# Patient Record
Sex: Male | Born: 1935 | Race: White | Hispanic: No | Smoking: Former smoker
Health system: Southern US, Community
[De-identification: ages and names within clinical notes are randomized; demographics above are authoritative.]

## PROBLEM LIST (undated history)

## (undated) DIAGNOSIS — F0150 Vascular dementia without behavioral disturbance: Secondary | ICD-10-CM

## (undated) DIAGNOSIS — R5383 Other fatigue: Secondary | ICD-10-CM

## (undated) DIAGNOSIS — G8929 Other chronic pain: Secondary | ICD-10-CM

## (undated) DIAGNOSIS — M1990 Unspecified osteoarthritis, unspecified site: Secondary | ICD-10-CM

## (undated) DIAGNOSIS — I493 Ventricular premature depolarization: Secondary | ICD-10-CM

## (undated) DIAGNOSIS — I951 Orthostatic hypotension: Secondary | ICD-10-CM

## (undated) DIAGNOSIS — I639 Cerebral infarction, unspecified: Secondary | ICD-10-CM

## (undated) DIAGNOSIS — I5022 Chronic systolic (congestive) heart failure: Secondary | ICD-10-CM

## (undated) DIAGNOSIS — G4700 Insomnia, unspecified: Secondary | ICD-10-CM

## (undated) DIAGNOSIS — Z8719 Personal history of other diseases of the digestive system: Secondary | ICD-10-CM

## (undated) DIAGNOSIS — K5909 Other constipation: Secondary | ICD-10-CM

## (undated) DIAGNOSIS — L723 Sebaceous cyst: Secondary | ICD-10-CM

## (undated) DIAGNOSIS — D696 Thrombocytopenia, unspecified: Secondary | ICD-10-CM

## (undated) DIAGNOSIS — R51 Headache: Secondary | ICD-10-CM

## (undated) DIAGNOSIS — J189 Pneumonia, unspecified organism: Secondary | ICD-10-CM

## (undated) DIAGNOSIS — K219 Gastro-esophageal reflux disease without esophagitis: Secondary | ICD-10-CM

## (undated) DIAGNOSIS — M1712 Unilateral primary osteoarthritis, left knee: Secondary | ICD-10-CM

## (undated) DIAGNOSIS — I10 Essential (primary) hypertension: Secondary | ICD-10-CM

## (undated) DIAGNOSIS — M25519 Pain in unspecified shoulder: Secondary | ICD-10-CM

## (undated) DIAGNOSIS — E039 Hypothyroidism, unspecified: Secondary | ICD-10-CM

## (undated) DIAGNOSIS — E538 Deficiency of other specified B group vitamins: Secondary | ICD-10-CM

## (undated) DIAGNOSIS — E7800 Pure hypercholesterolemia, unspecified: Secondary | ICD-10-CM

## (undated) DIAGNOSIS — E119 Type 2 diabetes mellitus without complications: Secondary | ICD-10-CM

## (undated) DIAGNOSIS — R5381 Other malaise: Secondary | ICD-10-CM

## (undated) DIAGNOSIS — M129 Arthropathy, unspecified: Secondary | ICD-10-CM

## (undated) DIAGNOSIS — I499 Cardiac arrhythmia, unspecified: Secondary | ICD-10-CM

## (undated) DIAGNOSIS — D759 Disease of blood and blood-forming organs, unspecified: Secondary | ICD-10-CM

## (undated) DIAGNOSIS — I4891 Unspecified atrial fibrillation: Secondary | ICD-10-CM

## (undated) DIAGNOSIS — T84019A Broken internal joint prosthesis, unspecified site, initial encounter: Secondary | ICD-10-CM

## (undated) DIAGNOSIS — M545 Low back pain, unspecified: Secondary | ICD-10-CM

## (undated) DIAGNOSIS — Z9889 Other specified postprocedural states: Secondary | ICD-10-CM

## (undated) DIAGNOSIS — I2510 Atherosclerotic heart disease of native coronary artery without angina pectoris: Secondary | ICD-10-CM

## (undated) DIAGNOSIS — I82409 Acute embolism and thrombosis of unspecified deep veins of unspecified lower extremity: Secondary | ICD-10-CM

## (undated) DIAGNOSIS — M79609 Pain in unspecified limb: Secondary | ICD-10-CM

## (undated) HISTORY — DX: Type 2 diabetes mellitus without complications: E11.9

## (undated) HISTORY — DX: Sebaceous cyst: L72.3

## (undated) HISTORY — DX: Arthropathy, unspecified: M12.9

## (undated) HISTORY — DX: Deficiency of other specified B group vitamins: E53.8

## (undated) HISTORY — DX: Chronic systolic (congestive) heart failure: I50.22

## (undated) HISTORY — PX: BACK SURGERY: SHX140

## (undated) HISTORY — DX: Unspecified atrial fibrillation: I48.91

## (undated) HISTORY — DX: Gastro-esophageal reflux disease without esophagitis: K21.9

## (undated) HISTORY — DX: Atherosclerotic heart disease of native coronary artery without angina pectoris: I25.10

## (undated) HISTORY — DX: Pneumonia, unspecified organism: J18.9

## (undated) HISTORY — DX: Thrombocytopenia, unspecified: D69.6

## (undated) HISTORY — DX: Vascular dementia, unspecified severity, without behavioral disturbance, psychotic disturbance, mood disturbance, and anxiety: F01.50

## (undated) HISTORY — DX: Orthostatic hypotension: I95.1

## (undated) HISTORY — DX: Insomnia, unspecified: G47.00

## (undated) HISTORY — DX: Other specified postprocedural states: Z98.890

## (undated) HISTORY — DX: Pain in unspecified limb: M79.609

## (undated) HISTORY — DX: Unspecified osteoarthritis, unspecified site: M19.90

## (undated) HISTORY — PX: COLONOSCOPY: SHX174

## (undated) HISTORY — DX: Other malaise: R53.81

## (undated) HISTORY — DX: Acute embolism and thrombosis of unspecified deep veins of unspecified lower extremity: I82.409

## (undated) HISTORY — DX: Other constipation: K59.09

## (undated) HISTORY — DX: Other fatigue: R53.83

## (undated) HISTORY — DX: Essential (primary) hypertension: I10

## (undated) HISTORY — DX: Cerebral infarction, unspecified: I63.9

## (undated) HISTORY — DX: Ventricular premature depolarization: I49.3

## (undated) HISTORY — DX: Hypothyroidism, unspecified: E03.9

## (undated) HISTORY — PX: HAND SURGERY: SHX662

## (undated) HISTORY — PX: SHOULDER HEMI-ARTHROPLASTY: SHX5049

## (undated) HISTORY — PX: FINGER AMPUTATION: SHX636

## (undated) HISTORY — DX: Pain in unspecified shoulder: M25.519

## (undated) HISTORY — DX: Pure hypercholesterolemia, unspecified: E78.00

---

## 1989-09-03 HISTORY — PX: KNEE SURGERY: SHX244

## 2002-05-09 ENCOUNTER — Encounter: Payer: Self-pay | Admitting: Orthopaedic Surgery

## 2002-05-09 ENCOUNTER — Ambulatory Visit (HOSPITAL_COMMUNITY): Admission: RE | Admit: 2002-05-09 | Discharge: 2002-05-09 | Payer: Self-pay | Admitting: Orthopaedic Surgery

## 2005-03-29 ENCOUNTER — Encounter: Admission: RE | Admit: 2005-03-29 | Discharge: 2005-03-29 | Payer: Self-pay | Admitting: *Deleted

## 2005-03-30 ENCOUNTER — Ambulatory Visit (HOSPITAL_COMMUNITY): Admission: RE | Admit: 2005-03-30 | Discharge: 2005-03-31 | Payer: Self-pay | Admitting: *Deleted

## 2005-08-07 ENCOUNTER — Emergency Department (HOSPITAL_COMMUNITY): Admission: EM | Admit: 2005-08-07 | Discharge: 2005-08-07 | Payer: Self-pay | Admitting: Emergency Medicine

## 2005-09-03 HISTORY — PX: LUMBAR SPINE SURGERY: SHX701

## 2007-09-04 HISTORY — PX: CATARACT EXTRACTION: SUR2

## 2008-01-19 ENCOUNTER — Encounter: Admission: RE | Admit: 2008-01-19 | Discharge: 2008-01-19 | Payer: Self-pay | Admitting: Family Medicine

## 2008-02-24 ENCOUNTER — Encounter: Admission: RE | Admit: 2008-02-24 | Discharge: 2008-02-24 | Payer: Self-pay | Admitting: Orthopedic Surgery

## 2008-02-25 ENCOUNTER — Encounter (INDEPENDENT_AMBULATORY_CARE_PROVIDER_SITE_OTHER): Payer: Self-pay | Admitting: Orthopedic Surgery

## 2008-02-25 ENCOUNTER — Ambulatory Visit (HOSPITAL_BASED_OUTPATIENT_CLINIC_OR_DEPARTMENT_OTHER): Admission: RE | Admit: 2008-02-25 | Discharge: 2008-02-25 | Payer: Self-pay | Admitting: Orthopedic Surgery

## 2008-10-26 ENCOUNTER — Emergency Department (HOSPITAL_COMMUNITY): Admission: EM | Admit: 2008-10-26 | Discharge: 2008-10-26 | Payer: Self-pay | Admitting: Emergency Medicine

## 2009-04-05 ENCOUNTER — Encounter: Payer: Self-pay | Admitting: Family Medicine

## 2009-05-11 ENCOUNTER — Emergency Department (HOSPITAL_COMMUNITY): Admission: EM | Admit: 2009-05-11 | Discharge: 2009-05-13 | Payer: Self-pay | Admitting: Emergency Medicine

## 2009-07-04 ENCOUNTER — Encounter: Payer: Self-pay | Admitting: Family Medicine

## 2009-08-12 ENCOUNTER — Ambulatory Visit: Payer: Self-pay | Admitting: Family Medicine

## 2009-08-12 DIAGNOSIS — I10 Essential (primary) hypertension: Secondary | ICD-10-CM

## 2009-08-12 DIAGNOSIS — I2510 Atherosclerotic heart disease of native coronary artery without angina pectoris: Secondary | ICD-10-CM | POA: Insufficient documentation

## 2009-08-12 DIAGNOSIS — E7800 Pure hypercholesterolemia, unspecified: Secondary | ICD-10-CM

## 2009-08-12 DIAGNOSIS — K219 Gastro-esophageal reflux disease without esophagitis: Secondary | ICD-10-CM

## 2009-08-12 DIAGNOSIS — M79609 Pain in unspecified limb: Secondary | ICD-10-CM | POA: Insufficient documentation

## 2009-08-15 ENCOUNTER — Telehealth: Payer: Self-pay | Admitting: Family Medicine

## 2009-08-22 ENCOUNTER — Telehealth: Payer: Self-pay | Admitting: Family Medicine

## 2009-08-30 ENCOUNTER — Encounter: Payer: Self-pay | Admitting: Family Medicine

## 2009-09-08 ENCOUNTER — Ambulatory Visit: Payer: Self-pay | Admitting: Orthopedic Surgery

## 2009-11-07 ENCOUNTER — Telehealth: Payer: Self-pay | Admitting: Family Medicine

## 2009-11-25 ENCOUNTER — Ambulatory Visit: Payer: Self-pay | Admitting: Family Medicine

## 2009-12-29 ENCOUNTER — Encounter: Payer: Self-pay | Admitting: Family Medicine

## 2009-12-29 ENCOUNTER — Telehealth: Payer: Self-pay | Admitting: Family Medicine

## 2009-12-30 ENCOUNTER — Ambulatory Visit: Payer: Self-pay | Admitting: Family Medicine

## 2009-12-30 LAB — CONVERTED CEMR LAB
Blood Glucose, Fingerstick: 93
Nitrite: NEGATIVE
Specific Gravity, Urine: <1.005
WBC Urine, dipstick: NEGATIVE

## 2010-01-02 ENCOUNTER — Telehealth: Payer: Self-pay | Admitting: Family Medicine

## 2010-01-02 LAB — CONVERTED CEMR LAB
Alkaline Phosphatase: 78 units/L (ref 39–117)
Basophils Absolute: 0 10*3/uL (ref 0.0–0.1)
Basophils Relative: 0 % (ref 0–1)
Folate: 15.7 ng/mL
Glucose, Bld: 101 mg/dL — ABNORMAL HIGH (ref 70–99)
MCHC: 33.7 g/dL (ref 30.0–36.0)
Monocytes Absolute: 0.7 10*3/uL (ref 0.1–1.0)
Neutro Abs: 4.5 10*3/uL (ref 1.7–7.7)
Neutrophils Relative %: 58 % (ref 43–77)
Platelets: 169 10*3/uL (ref 150–400)
RDW: 14 % (ref 11.5–15.5)
Sodium: 138 meq/L (ref 135–145)
Total Bilirubin: 0.6 mg/dL (ref 0.3–1.2)
Total Protein: 7.1 g/dL (ref 6.0–8.3)
Vitamin B-12: 226 pg/mL (ref 211–911)

## 2010-01-04 ENCOUNTER — Ambulatory Visit: Payer: Self-pay | Admitting: Family Medicine

## 2010-01-04 DIAGNOSIS — E538 Deficiency of other specified B group vitamins: Secondary | ICD-10-CM | POA: Insufficient documentation

## 2010-01-04 DIAGNOSIS — E039 Hypothyroidism, unspecified: Secondary | ICD-10-CM

## 2010-01-05 LAB — CONVERTED CEMR LAB: T3, Free: 2.7 pg/mL (ref 2.3–4.2)

## 2010-01-11 ENCOUNTER — Encounter: Payer: Self-pay | Admitting: Family Medicine

## 2010-01-16 ENCOUNTER — Encounter: Payer: Self-pay | Admitting: Family Medicine

## 2010-01-16 ENCOUNTER — Ambulatory Visit: Payer: Self-pay

## 2010-02-05 ENCOUNTER — Inpatient Hospital Stay (HOSPITAL_COMMUNITY): Admission: EM | Admit: 2010-02-05 | Discharge: 2010-02-07 | Payer: Self-pay | Admitting: Emergency Medicine

## 2010-02-06 ENCOUNTER — Encounter (INDEPENDENT_AMBULATORY_CARE_PROVIDER_SITE_OTHER): Payer: Self-pay | Admitting: Internal Medicine

## 2010-02-06 ENCOUNTER — Ambulatory Visit: Payer: Self-pay | Admitting: Vascular Surgery

## 2010-02-06 ENCOUNTER — Telehealth: Payer: Self-pay | Admitting: Family Medicine

## 2010-02-07 ENCOUNTER — Telehealth: Payer: Self-pay | Admitting: Family Medicine

## 2010-02-07 ENCOUNTER — Telehealth (INDEPENDENT_AMBULATORY_CARE_PROVIDER_SITE_OTHER): Payer: Self-pay | Admitting: *Deleted

## 2010-02-08 ENCOUNTER — Telehealth: Payer: Self-pay | Admitting: Family Medicine

## 2010-02-10 ENCOUNTER — Ambulatory Visit: Payer: Self-pay | Admitting: Family Medicine

## 2010-02-10 DIAGNOSIS — D696 Thrombocytopenia, unspecified: Secondary | ICD-10-CM | POA: Insufficient documentation

## 2010-02-10 DIAGNOSIS — M542 Cervicalgia: Secondary | ICD-10-CM

## 2010-02-13 ENCOUNTER — Ambulatory Visit: Payer: Self-pay | Admitting: Family Medicine

## 2010-02-14 LAB — CONVERTED CEMR LAB
Eosinophils Relative: 3.4 % (ref 0.0–5.0)
HCT: 39.6 % (ref 39.0–52.0)
Monocytes Relative: 10 % (ref 3.0–12.0)
Neutrophils Relative %: 56.1 % (ref 43.0–77.0)
Platelets: 146 10*3/uL — ABNORMAL LOW (ref 150.0–400.0)
WBC: 7.5 10*3/uL (ref 4.5–10.5)

## 2010-02-23 ENCOUNTER — Telehealth: Payer: Self-pay | Admitting: Family Medicine

## 2010-02-24 ENCOUNTER — Telehealth: Payer: Self-pay | Admitting: Family Medicine

## 2010-02-28 HISTORY — PX: US ECHOCARDIOGRAPHY: HXRAD669

## 2010-03-01 ENCOUNTER — Telehealth (INDEPENDENT_AMBULATORY_CARE_PROVIDER_SITE_OTHER): Payer: Self-pay | Admitting: *Deleted

## 2010-03-01 ENCOUNTER — Ambulatory Visit: Payer: Self-pay | Admitting: Family Medicine

## 2010-03-07 LAB — CONVERTED CEMR LAB
AST: 23 units/L (ref 0–37)
Albumin: 3.8 g/dL (ref 3.5–5.2)
Alkaline Phosphatase: 57 units/L (ref 39–117)
Basophils Relative: 0.6 % (ref 0.0–3.0)
CO2: 32 meq/L (ref 19–32)
Chloride: 108 meq/L (ref 96–112)
Eosinophils Relative: 3.6 % (ref 0.0–5.0)
Glucose, Bld: 107 mg/dL — ABNORMAL HIGH (ref 70–99)
HCT: 41.5 % (ref 39.0–52.0)
Lymphs Abs: 2.2 10*3/uL (ref 0.7–4.0)
MCV: 92.4 fL (ref 78.0–100.0)
Monocytes Absolute: 0.6 10*3/uL (ref 0.1–1.0)
Monocytes Relative: 8.2 % (ref 3.0–12.0)
Neutrophils Relative %: 55.4 % (ref 43.0–77.0)
Potassium: 3.9 meq/L (ref 3.5–5.1)
RBC: 4.49 M/uL (ref 4.22–5.81)
Sodium: 144 meq/L (ref 135–145)
Total Protein: 6.5 g/dL (ref 6.0–8.3)
WBC: 7 10*3/uL (ref 4.5–10.5)

## 2010-03-08 ENCOUNTER — Ambulatory Visit: Payer: Self-pay | Admitting: Family Medicine

## 2010-03-08 DIAGNOSIS — G4700 Insomnia, unspecified: Secondary | ICD-10-CM

## 2010-03-22 ENCOUNTER — Telehealth: Payer: Self-pay | Admitting: Family Medicine

## 2010-03-27 ENCOUNTER — Telehealth: Payer: Self-pay | Admitting: Family Medicine

## 2010-03-27 DIAGNOSIS — M25519 Pain in unspecified shoulder: Secondary | ICD-10-CM

## 2010-04-06 ENCOUNTER — Telehealth (INDEPENDENT_AMBULATORY_CARE_PROVIDER_SITE_OTHER): Payer: Self-pay | Admitting: *Deleted

## 2010-04-07 ENCOUNTER — Ambulatory Visit: Payer: Self-pay | Admitting: Family Medicine

## 2010-04-11 ENCOUNTER — Encounter: Payer: Self-pay | Admitting: Family Medicine

## 2010-04-14 ENCOUNTER — Telehealth (INDEPENDENT_AMBULATORY_CARE_PROVIDER_SITE_OTHER): Payer: Self-pay | Admitting: *Deleted

## 2010-04-20 ENCOUNTER — Encounter: Admission: RE | Admit: 2010-04-20 | Discharge: 2010-04-20 | Payer: Self-pay | Admitting: Orthopaedic Surgery

## 2010-05-01 ENCOUNTER — Telehealth (INDEPENDENT_AMBULATORY_CARE_PROVIDER_SITE_OTHER): Payer: Self-pay | Admitting: *Deleted

## 2010-05-02 ENCOUNTER — Ambulatory Visit (HOSPITAL_COMMUNITY): Admission: RE | Admit: 2010-05-02 | Discharge: 2010-05-02 | Payer: Self-pay | Admitting: Orthopaedic Surgery

## 2010-05-02 HISTORY — PX: SHOULDER OPEN ROTATOR CUFF REPAIR: SHX2407

## 2010-05-11 ENCOUNTER — Telehealth (INDEPENDENT_AMBULATORY_CARE_PROVIDER_SITE_OTHER): Payer: Self-pay | Admitting: *Deleted

## 2010-05-15 ENCOUNTER — Telehealth: Payer: Self-pay | Admitting: Family Medicine

## 2010-05-17 ENCOUNTER — Emergency Department (HOSPITAL_COMMUNITY): Admission: EM | Admit: 2010-05-17 | Discharge: 2010-05-17 | Payer: Self-pay | Admitting: Emergency Medicine

## 2010-05-17 ENCOUNTER — Encounter (INDEPENDENT_AMBULATORY_CARE_PROVIDER_SITE_OTHER): Payer: Self-pay | Admitting: Emergency Medicine

## 2010-05-17 ENCOUNTER — Encounter (INDEPENDENT_AMBULATORY_CARE_PROVIDER_SITE_OTHER): Payer: Self-pay | Admitting: *Deleted

## 2010-05-17 ENCOUNTER — Ambulatory Visit: Payer: Self-pay | Admitting: Vascular Surgery

## 2010-05-18 ENCOUNTER — Encounter: Payer: Self-pay | Admitting: Family Medicine

## 2010-05-18 ENCOUNTER — Telehealth (INDEPENDENT_AMBULATORY_CARE_PROVIDER_SITE_OTHER): Payer: Self-pay | Admitting: *Deleted

## 2010-06-02 ENCOUNTER — Inpatient Hospital Stay (HOSPITAL_COMMUNITY): Admission: EM | Admit: 2010-06-02 | Discharge: 2010-06-06 | Payer: Self-pay | Admitting: Emergency Medicine

## 2010-07-12 ENCOUNTER — Encounter: Payer: Self-pay | Admitting: Internal Medicine

## 2010-07-20 ENCOUNTER — Encounter: Payer: Self-pay | Admitting: Family Medicine

## 2010-07-20 ENCOUNTER — Ambulatory Visit: Payer: Self-pay | Admitting: Family Medicine

## 2010-07-20 DIAGNOSIS — R5383 Other fatigue: Secondary | ICD-10-CM

## 2010-07-20 DIAGNOSIS — R5381 Other malaise: Secondary | ICD-10-CM

## 2010-07-24 ENCOUNTER — Ambulatory Visit: Payer: Self-pay | Admitting: Cardiovascular Disease

## 2010-07-24 LAB — CONVERTED CEMR LAB
Albumin: 3.7 g/dL (ref 3.5–5.2)
BUN: 9 mg/dL (ref 6–23)
Basophils Absolute: 0.1 10*3/uL (ref 0.0–0.1)
CO2: 25 meq/L (ref 19–32)
Eosinophils Absolute: 0.1 10*3/uL (ref 0.0–0.7)
GFR calc non Af Amer: 86.28 mL/min (ref >60)
Glucose, Bld: 119 mg/dL — ABNORMAL HIGH (ref 70–99)
HCT: 38.7 % — ABNORMAL LOW (ref 39.0–52.0)
Lipase: 45 units/L (ref 11.0–59.0)
Lymphs Abs: 2.4 10*3/uL (ref 0.7–4.0)
MCHC: 33.6 g/dL (ref 30.0–36.0)
Monocytes Absolute: 0.6 10*3/uL (ref 0.1–1.0)
Monocytes Relative: 6 % (ref 3.0–12.0)
Neutro Abs: 6.7 10*3/uL (ref 1.4–7.7)
Platelets: 187 10*3/uL (ref 150.0–400.0)
Potassium: 3.7 meq/L (ref 3.5–5.1)
RDW: 16.4 % — ABNORMAL HIGH (ref 11.5–14.6)
TSH: 4.37 microintl units/mL (ref 0.35–5.50)
Total Bilirubin: 1.1 mg/dL (ref 0.3–1.2)

## 2010-07-31 ENCOUNTER — Telehealth: Payer: Self-pay | Admitting: Family Medicine

## 2010-07-31 ENCOUNTER — Encounter: Admission: RE | Admit: 2010-07-31 | Discharge: 2010-07-31 | Payer: Self-pay | Admitting: Orthopaedic Surgery

## 2010-08-02 ENCOUNTER — Ambulatory Visit: Payer: Self-pay | Admitting: Family Medicine

## 2010-08-02 DIAGNOSIS — L723 Sebaceous cyst: Secondary | ICD-10-CM

## 2010-08-02 DIAGNOSIS — K5909 Other constipation: Secondary | ICD-10-CM

## 2010-08-07 ENCOUNTER — Ambulatory Visit: Payer: Self-pay | Admitting: Cardiovascular Disease

## 2010-08-10 ENCOUNTER — Inpatient Hospital Stay (HOSPITAL_COMMUNITY): Admission: EM | Admit: 2010-08-10 | Discharge: 2010-05-15 | Payer: Self-pay | Admitting: Emergency Medicine

## 2010-08-14 ENCOUNTER — Encounter: Payer: Self-pay | Admitting: Family Medicine

## 2010-08-29 ENCOUNTER — Ambulatory Visit: Payer: Self-pay | Admitting: Internal Medicine

## 2010-09-06 ENCOUNTER — Telehealth: Payer: Self-pay | Admitting: Internal Medicine

## 2010-09-18 LAB — URINE CULTURE
Colony Count: NO GROWTH
Culture  Setup Time: 201201112253
Culture: NO GROWTH

## 2010-09-18 LAB — DIFFERENTIAL
Basophils Absolute: 0 10*3/uL (ref 0.0–0.1)
Basophils Relative: 0 % (ref 0–1)
Eosinophils Absolute: 0.2 10*3/uL (ref 0.0–0.7)
Eosinophils Relative: 3 % (ref 0–5)
Lymphocytes Relative: 36 % (ref 12–46)
Lymphs Abs: 2.6 10*3/uL (ref 0.7–4.0)
Monocytes Absolute: 0.7 10*3/uL (ref 0.1–1.0)
Monocytes Relative: 9 % (ref 3–12)
Neutro Abs: 3.8 10*3/uL (ref 1.7–7.7)
Neutrophils Relative %: 52 % (ref 43–77)

## 2010-09-18 LAB — TYPE AND SCREEN
ABO/RH(D): O POS
Antibody Screen: NEGATIVE

## 2010-09-18 LAB — COMPREHENSIVE METABOLIC PANEL
ALT: 11 U/L (ref 0–53)
AST: 19 U/L (ref 0–37)
Albumin: 4 g/dL (ref 3.5–5.2)
Alkaline Phosphatase: 71 U/L (ref 39–117)
BUN: 10 mg/dL (ref 6–23)
CO2: 24 mEq/L (ref 19–32)
Calcium: 9.1 mg/dL (ref 8.4–10.5)
Chloride: 106 mEq/L (ref 96–112)
Creatinine, Ser: 1 mg/dL (ref 0.4–1.5)
GFR calc Af Amer: >60 mL/min (ref >60)
GFR calc non Af Amer: >60 mL/min (ref >60)
Glucose, Bld: 108 mg/dL — ABNORMAL HIGH (ref 70–99)
Potassium: 3.7 mEq/L (ref 3.5–5.1)
Sodium: 137 mEq/L (ref 135–145)
Total Bilirubin: 0.5 mg/dL (ref 0.3–1.2)
Total Protein: 6.8 g/dL (ref 6.0–8.3)

## 2010-09-18 LAB — CBC
HCT: 42.6 % (ref 39.0–52.0)
Hemoglobin: 14.5 g/dL (ref 13.0–17.0)
MCH: 29.5 pg (ref 26.0–34.0)
MCHC: 34 g/dL (ref 30.0–36.0)
MCV: 86.6 fL (ref 78.0–100.0)
Platelets: 175 10*3/uL (ref 150–400)
RBC: 4.92 MIL/uL (ref 4.22–5.81)
RDW: 16.7 % — ABNORMAL HIGH (ref 11.5–15.5)
WBC: 7.3 10*3/uL (ref 4.0–10.5)

## 2010-09-18 LAB — URINALYSIS, ROUTINE W REFLEX MICROSCOPIC
Bilirubin Urine: NEGATIVE
Hgb urine dipstick: NEGATIVE
Ketones, ur: NEGATIVE mg/dL
Nitrite: NEGATIVE
Protein, ur: NEGATIVE mg/dL
Specific Gravity, Urine: 1.013 (ref 1.005–1.030)
Urine Glucose, Fasting: NEGATIVE mg/dL
Urobilinogen, UA: 0.2 mg/dL (ref 0.0–1.0)
pH: 5.5 (ref 5.0–8.0)

## 2010-09-18 LAB — PROTIME-INR
INR: 1 (ref 0.00–1.49)
Prothrombin Time: 13.4 seconds (ref 11.6–15.2)

## 2010-09-18 LAB — ABO/RH: ABO/RH(D): O POS

## 2010-09-18 LAB — SURGICAL PCR SCREEN
MRSA, PCR: NEGATIVE
Staphylococcus aureus: NEGATIVE

## 2010-09-18 LAB — APTT: aPTT: 28 seconds (ref 24–37)

## 2010-09-19 ENCOUNTER — Inpatient Hospital Stay (HOSPITAL_COMMUNITY)
Admission: RE | Admit: 2010-09-19 | Discharge: 2010-09-22 | Payer: Self-pay | Source: Home / Self Care | Attending: Orthopaedic Surgery | Admitting: Orthopaedic Surgery

## 2010-09-19 ENCOUNTER — Telehealth: Payer: Self-pay | Admitting: Internal Medicine

## 2010-09-22 ENCOUNTER — Telehealth: Payer: Self-pay | Admitting: Internal Medicine

## 2010-09-24 ENCOUNTER — Encounter: Payer: Self-pay | Admitting: Orthopaedic Surgery

## 2010-09-25 LAB — CBC
HCT: 34.9 % — ABNORMAL LOW (ref 39.0–52.0)
HCT: 35 % — ABNORMAL LOW (ref 39.0–52.0)
Hemoglobin: 11.5 g/dL — ABNORMAL LOW (ref 13.0–17.0)
MCH: 28.3 pg (ref 26.0–34.0)
MCHC: 33.1 g/dL (ref 30.0–36.0)
Platelets: 141 10*3/uL — ABNORMAL LOW (ref 150–400)
Platelets: 131 10*3/uL — ABNORMAL LOW (ref 150–400)
RBC: 4.34 MIL/uL (ref 4.22–5.81)
RBC: 4.05 MIL/uL — ABNORMAL LOW (ref 4.22–5.81)
RDW: 16.5 % — ABNORMAL HIGH (ref 11.5–15.5)
WBC: 9.1 10*3/uL (ref 4.0–10.5)
WBC: 10.9 10*3/uL — ABNORMAL HIGH (ref 4.0–10.5)

## 2010-09-25 LAB — BASIC METABOLIC PANEL
CO2: 24 mEq/L (ref 19–32)
CO2: 27 mEq/L (ref 19–32)
Calcium: 8.3 mg/dL — ABNORMAL LOW (ref 8.4–10.5)
Creatinine, Ser: 1.2 mg/dL (ref 0.4–1.5)
GFR calc Af Amer: >60 mL/min (ref >60)
GFR calc Af Amer: >60 mL/min (ref >60)
GFR calc non Af Amer: >60 mL/min (ref >60)
Glucose, Bld: 152 mg/dL — ABNORMAL HIGH (ref 70–99)
Potassium: 3.4 mEq/L — ABNORMAL LOW (ref 3.5–5.1)
Potassium: 3.5 mEq/L (ref 3.5–5.1)
Sodium: 137 mEq/L (ref 135–145)
Sodium: 139 mEq/L (ref 135–145)

## 2010-09-29 NOTE — Op Note (Signed)
NAMEHASHIM, EICHHORST NO.:  [id number]  MEDICAL RECORD NO.:  [id number]          PATIENT TYPE:  INP  LOCATION:  5032                         FACILITY:  [facility]  PHYSICIAN:  Claude Manges. Geneieve Duell, M.D.DATE OF BIRTH:  March 26, 1928  DATE OF PROCEDURE:  09/19/2010 DATE OF DISCHARGE:                              OPERATIVE REPORT   PREOPERATIVE DIAGNOSIS:  Chronic posterior dislocation, left shoulder with impacted humeral head.  POSTOPERATIVE DIAGNOSIS:  Chronic posterior dislocation, left shoulder with impacted humeral head.  PROCEDURE:  Hemiarthroplasty, left shoulder, after open reduction of dislocated left humeral head.  SURGEON:  Claude Manges. [name], MD  ASSISTANT:  Oris Drone. Petrarca, PA-C  ANESTHESIA:  General with supplemental interscalene nerve block.  COMPLICATIONS:  None.  COMPONENTS:  DePuy global advantage 10-mm humeral stem with a 44-mm outer diameter eccentric humeral head with an 18-mm neck length.  PROCEDURE:  Mr. Treto was met in the holding area, identified his left shoulder as the appropriate operative site.  He was then transported to room #1 and placed under general orotracheal anesthesia.  He did receive a preoperative interscalene nerve block in the holding area, nursing staff inserted a Foley catheter.  Urine was clear.  The patient was then placed in a semi-sitting position with the shoulder frame.  Examination revealed minimal external rotation with persistently dislocated humeral head.  The left shoulder was then prepped with Betadine scrub and DuraPrep from the base of the neck circumferentially about the mid forearm.  Sterile draping was performed.  A skin incision was outlined along the deltopectoral groove beginning at the coracoid extending about 4 inches distally and obliquely.  Via sharp dissection, the incision was carried down to the subcutaneous tissue. Small bleeders were Bovie coagulated.  The cephalic vein was  identified and carefully retracted laterally.  The deltopectoral groove was then developed manually.  Self-retaining retractor was inserted.  The clavipectoral fascia was scarred in and by finger palpation, I was able to separated from the subscapularis.  The head was internally rotated as a result of the chronic posterior dislocation and impaction of the head. About a centimeter medial to the subscap attachment to the lesser tuberosity, the subscapularis tendon was incised using the needle tip Bovie, it was tagged superiorly and inferiorly with 0 Ethibond suture. I then completed the incision through the level of the capsule.  The subscap was retracted medially.  At that point, I could visualize the head.  There was obvious impaction.  With using a bone hook, I was able to dislodge the humeral head as it was perched on the posterior glenoid and then reduced it.  The head was significantly impacted with an offset of almost an inch, impacted anteriorly with the head intact posterior. I then released the capsule superiorly and inferiorly, so that I could expose the head in the wound.  At that point, a drill hole was made in the very center of the head just medial to the biceps tendon at the superior aspect of the head in the midline.  A subsequent reaming was performed by hand using the 6, the 8 and then the 10  mm reamer.  With the reamer in place, the external cutting guide was then applied and osteotomy made along the humeral head.  There was very little bone removed along the anterior half because of the impaction.  Posteriorly, I removed good portion of the head and then measured it at somewhere 44 and 48 mm.  We made a second cut as we felt we had not removed enough bone.  Rasping was then performed sequentially to the 10 mm.  It was then removed, the joint was inspected, the glenoid appeared to be intact.  It was not flat.  There was one small area of excoriated articular cartilage,  but otherwise the articular cartilage remained intact and there was no deformity posteriorly.  There was a moderate amount of synovitis and contracted capsule, which I debrided.  I did not feel any loose bodies.  The 10-mm rasp was then re-impacted, flushed on the humeral head.  We initially tried a 48-mm head with a 15-mm neck length and then reduce this.  I thought the version was perfect, but after several trials, we felt that the 44-mm outer diameter eccentric head was the best fit. With this in place, we were able to abduct about 100 degrees and I could flex the shoulder about 140 degrees.  Because of the period of dislocation, there was some capsular contraction.  I also was able to carefully probed the musculocutaneous and axillary nerves and was careful to protect these throughout the procedure.  The trial components were then removed.  We copiously irrigated the joint with saline solution.  We then impacted the final global advantage 10-mm humeral stem on the humeral surface, I did use some bone graft as impaction.  We then applied the 44-mm outer diameter eccentric metallic head onto the reverse Morse taper portion of the stem, it was nice and tight.  We then inspected the glenoid without evidence of loose material, it was then reduced.  We thought we had the appropriate amount of tension.  We were able to sublux the had 50% posteriorly, abduct 100 degrees and flex about 140 without subluxation or dislocation.  The wound was again irrigated with saline solution.  I had removed any dysfunctional capsule.  Subscapularis was then closed with 0 Ethibond suture from its inferior to superior extent.  The wound was again irrigated.  The deltopectoral groove closed with running 0 Vicryl, subcu with 3-0 Monocryl, skin closed with Steri-Strips.  Sterile bulky dressing was applied followed by a sling.  The patient tolerated the procedure without complications.     Claude Manges.  [name], M.D.     PWW/MEDQ  D:  09/19/2010  T:  09/19/2010  Job:  [id number]  Electronically Signed by Norlene Campbell M.D. on 09/27/2010 09:04:00 AM

## 2010-10-02 ENCOUNTER — Encounter: Payer: Self-pay | Admitting: Internal Medicine

## 2010-10-03 NOTE — Progress Notes (Signed)
Summary: Gabapentin  Phone Note Refill Request Message from:  Fax from Pharmacy on July 31, 2010 5:11 PM  Refills Requested: Medication #1:  GABAPENTIN 300 MG CAPS 1 by mouth at bedtime [facility]  Phone:   [phone]   Method Requested: Telephone to Pharmacy Initial call taken by: Delilah Shan CMA Duncan Dull),  July 31, 2010 5:12 PM    Prescriptions: GABAPENTIN 300 MG CAPS (GABAPENTIN) 1 by mouth at bedtime  #30 x 5   Entered and Authorized by:   [name] MD   Signed by:   [name] MD on 08/01/2010   Method used:   Electronically to        [facility]* (retail)       [address]       [place], [place]  [address]       Ph: [phone]       Fax: [phone]   RxID:   [id number]

## 2010-10-03 NOTE — Progress Notes (Signed)
Summary: [name] out of network  Phone Note Call from Patient   Caller: Patient Call For: [name] MD Summary of Call: Sp w/ Dr. Hoy Register office, says they do not accept pts ins, it would be out of network if the pts came to see him. Called pts daughter. Says she will discuss w/ her father and call me back.  Pts daughter called back, says pt wants to see [name] and he understand they are not contrasted w/ his ins.[name]  April 06, 2010 10:55 AM   Follow-up for Phone Call        Sp w/ pt on 04-06-2010, says he does not want to see another doctor, told him again [name] is out of network. Not sure he understand, however the daughter told me this is what he wanted. Told pt to discuss w/ his daughter and call me back. Says he would do so.Marland KitchenDaine Gip  April 07, 2010 9:32 AM  Follow-up by: [name],  April 07, 2010 9:32 AM

## 2010-10-03 NOTE — Progress Notes (Signed)
Summary: [facility]  Phone Note Call from Patient   Caller: Daughter- [name] Summary of Call: Daughter called to let you know that pt was admitted to [facility] on sunday evening with confusion and blood pressure issues. Initial call taken by: Lowella Petties CMA,  February 07, 2010 9:44 AM

## 2010-10-03 NOTE — Progress Notes (Signed)
Summary: pt felt "swimmy headed"  Phone Note Call from Patient   Caller: Daughter Call For: [name] MD Summary of Call: Pt is coming in friday for a hospital follow up and his daughter called to report that pt was feeling "swimmy headed" last night.  She wanted you to be aware of this.  I told her to call back if problems before pt's appt. Initial call taken by: Lowella Petties CMA,  February 08, 2010 11:44 AM  Follow-up for Phone Call        Aware...if symtpoms worsenor CP, SOB bring in sooner than Friday.   Follow-up by: [name] MD,  February 08, 2010 12:15 PM  Additional Follow-up for Phone Call Additional follow up Details #1::        Patient daughter advised.Consuello Masse CMA  Additional Follow-up by: Benny Lennert CMA Duncan Dull),  February 08, 2010 12:27 PM

## 2010-10-03 NOTE — Letter (Signed)
Summary: Records Dated 03-30-05 thru 07-04-09/[facility]  Records Dated 03-30-05 thru 07-04-09/[facility]   Imported By: Lanelle Bal 09/12/2009 12:45:27  _____________________________________________________________________  External Attachment:    Type:   Image     Comment:   External Document

## 2010-10-03 NOTE — Assessment & Plan Note (Signed)
Summary: NOT FEELING WELL/CLE   Vital Signs:  Patient profile:   77 year old male Weight:      152.25 pounds Temp:     98.3 degrees F oral Pulse rate:   60 / minute Pulse rhythm:   irregular BP sitting:   132 / 80  (right arm) Cuff size:   regular  Vitals Entered By: Selena Batten Dance CMA Duncan Dull) (July 20, 2010 3:19 PM) CC: Not feeling well   History of Present Illness: CC: "sick all over"  Complicated patient.  Presents with daughter.  mild dementia but lives and functions well alone   3wk h/o not feeling well, each time he eats feels sick in [place].  Once this week nauseated.  No vomiting.  + some constipation although has been having regular soft stools over last few days (3 today), has been on miralax and prune juice.  Off most pain meds except oxycodone 5mg .  No fevers/chills.  No diarrhea.  No blood in stool or urine.  No dysuria, urgency or frequency.  Endorses 10lb weight loss since September.  No chest pain or tightness, SOB, worsening leg swelling or HA/vision changes, weakness.  No slurred speech.  No dizziness.    Came home from [facility] on October 21st.  Lives by himself, undergoing PT/OT 3x/wk, has nurse aide who comes home MWF as well.  Daughter comes daily to check on him.  Has not seen doctor since hospitalization.  Eating and drinking good (water, milk).  h/o L broken shoulder since 05/12/2010, admission to [facility].  h/o thoracic compession fracture.  h/o RTC repair on R.  Recent admission 06/2010 with fever.  To see ortho tomorrow and see if able to release from back brace.    Dx with early stages of dementia, started on aricept by PCP.  Daughter thinks may be helping.  records reviewed from 06/2010 admission.  Cardiologist is Dr. Elease Hashimoto.  Pt and daughter say he has 2 known blockages in heart vessels, medically managing for now unless starts becoming symptomatic from cardiac standpoint.  reviewed latest cards note - diffuse small vessel disease, treating  medically.  ++PNDrip.  + insomnia recently, asks what can he use.  lunesta was too expensive.  allergic to codeine, hasn't tried benadryl or unisom.  Current Medications (verified): 1)  Aspir-Low 81 Mg Tbec (Aspirin) .... One A Day 2)  Nitrostat 0.3 Mg Subl (Nitroglycerin) .Marland Kitchen.. 1 As Needed 3)  Lasix 40 Mg Tabs (Furosemide) .Marland Kitchen.. 1 By Mouth Daily As Needed For Weight Gain/swelling. 4)  Potassium Chloride  Gran (Potassium Chloride) .Marland Kitchen.. 1 By Mouth Daily As Needed With Lasix 5)  Donepezil Hcl 10 Mg Tabs (Donepezil Hcl) .Marland Kitchen.. 1 Tab By Mouth Daily 6)  Oxycodone Hcl 5 Mg Tabs (Oxycodone Hcl) .... Take One Tablet By Mouth Every 4 Hours 7)  Diclofenac Sodium 75 Mg Tbec (Diclofenac Sodium) .... One Tablet By Mouth 2 Times Daily 8)  Imdur 30 Mg Xr24h-Tab (Isosorbide Mononitrate) .... One Tablet By Mouth Daily 9)  Prilosec 20 Mg Cpdr (Omeprazole) .... Two Capsules By Mouth Daily 10)  Colace 100 Mg Caps (Docusate Sodium) .... One Tablet 2 Times Daily While On Narcotics 11)  Gabapentin 300 Mg Caps (Gabapentin) .Marland Kitchen.. 1 By Mouth At Bedtime 12)  Miralax  Powd (Polyethylene Glycol 3350) .Marland Kitchen.. 17gm in 8oz Fluid Daily For Constipation As Needed  Allergies (verified): No Known Drug Allergies  Past History:  Past Medical History: Last updated: 08/12/2009 Current Problems:  HYPERCHOLESTEROLEMIA (ICD-272.0) HYPERTENSION (ICD-401.9) ARTHRITIS (ICD-716.90),  shoulders    Past Surgical History: Last updated: 08/12/2009 cardiac cath  ~2007 2010 2 surgery right hand crush injury  right 5th digit contracture, limtied motion lumbar back surgery 2007 : Dr. Jarold Motto (neurosurgeon) knne surgery age 39  Social History: Last updated: 08/12/2009 Occupation: Widow/W[employer]ower Never Smoked Alcohol use-no Drug use-no Regular exercise-no  Review of Systems       per HPI  Physical Exam  General:  elderly male in NAD Mouth:  MMM Neck:  no carotid bruit or thyromegaly no cervical or supraclavicular  lymphadenopathy  Lungs:  Normal respiratory effort, chest expands symmetrically. Lungs are clear to auscultation, no crackles or wheezes. Heart:  irregular.  no murmur appreciated. Abdomen:  Bowel sounds positive,abdomen soft and non-tender without masses, organomegaly or hernias noted. Msk:  back brace on. Pulses:  R and L posterior tibial pulses are full and equal bilaterally  Extremities:  1 plus edema B, B varicosities Neurologic:  able to get on exam table without assistance, although slow.  CN grossly intact.  station slow but steady.  no focal weakness noted.  A&O Skin:  Intact without suspicious lesions or rashes or ulcers.   Impression & Recommendations:  Problem # 1:  MALAISE AND FATIGUE (ICD-780.79) not feeling well.  checked EKG given malaise and irregular beat.  see below.  obtain basic blood work.  recommended try benadryl for insomnia to also hopefully help PNDrip.  increased stress from recent hospitalization.  vitals stable today.  RTC 1-2 wks for f/u with PCP.  Orders: EKG w/ Interpretation (93000) TLB-BMP (Basic Metabolic Panel-BMET) (80048-METABOL) TLB-CBC Platelet - w/Differential (85025-CBCD) TLB-Hepatic/Liver Function Pnl (80076-HEPATIC) TLB-TSH (Thyroid Stimulating Hormone) (84443-TSH) TLB-Lipase (83690-LIPASE)  Problem # 2:  CAD (ICD-414.00) malaise in setting of patient with known CAD/HLD although latest LDL good control.  check blood work above.  Will ask to set up with cards sooner than scheduled Dec appt (w/in next 1 wk if able).  advised if any chest pain or SOB, to go to ER.  EKG - sinus with PVCs and ventricular trigeminy.  ?slightly prolonged QT.  lateral lead strain somewhat more pronounced than last EKG December 26, 2009, but PVCs dont' give clear picture currently.  His updated medication list for this problem includes:    Aspir-low 81 Mg Tbec (Aspirin) ..... One a day    Nitrostat 0.3 Mg Subl (Nitroglycerin) .Marland Kitchen... 1 as needed    Lasix 40 Mg Tabs (Furosemide)  .Marland Kitchen... 1 by mouth daily as needed for weight gain/swelling.    Imdur 30 Mg Xr24h-tab (Isosorbide mononitrate) ..... One tablet by mouth daily  Labs Reviewed: Chol: 127 (03/01/2010)   HDL: 41.70 (03/01/2010)   LDL: 73 (03/01/2010)   TG: 60.0 (03/01/2010)  Complete Medication List: 1)  Aspir-low 81 Mg Tbec (Aspirin) .... One a day 2)  Nitrostat 0.3 Mg Subl (Nitroglycerin) .Marland Kitchen.. 1 as needed 3)  Lasix 40 Mg Tabs (Furosemide) .Marland Kitchen.. 1 by mouth daily as needed for weight gain/swelling. 4)  Potassium Chloride Gran (Potassium chloride) .Marland Kitchen.. 1 by mouth daily as needed with lasix 5)  Donepezil Hcl 10 Mg Tabs (Donepezil hcl) .Marland Kitchen.. 1 tab by mouth daily 6)  Oxycodone Hcl 5 Mg Tabs (Oxycodone hcl) .... Take one tablet by mouth every 4 hours 7)  Diclofenac Sodium 75 Mg Tbec (Diclofenac sodium) .... One tablet by mouth 2 times daily 8)  Imdur 30 Mg Xr24h-tab (Isosorbide mononitrate) .... One tablet by mouth daily 9)  Prilosec 20 Mg Cpdr (Omeprazole) .... Two capsules by mouth daily 10)  Colace 100 Mg Caps (Docusate sodium) .... One tablet 2 times daily while on narcotics 11)  Gabapentin 300 Mg Caps (Gabapentin) .Marland Kitchen.. 1 by mouth at bedtime 12)  Miralax Powd (Polyethylene glycol 3350) .Marland Kitchen.. 17gm in 8oz fluid daily for constipation as needed  Patient Instructions: 1)  Blood work today. 2)  EKG showing some strain on heart.  We will send you to heart doctor for further evaluation. 3)  Make sure you're drinking plenty of water as well as taking the stool softeners - sent to pharmacy. 4)  Try benadryl for sleep. 5)  if you start having any chest pain or tightness or shortness of breath, please go to hospital. Prescriptions: MIRALAX  POWD (POLYETHYLENE GLYCOL 3350) 17gm in 8oz fluid daily for constipation as needed  #1 x 3   Entered and Authorized by:   Eustaquio Boyden  MD   Signed by:   Eustaquio Boyden  MD on 07/20/2010   Method used:   Electronically to        [facility]* (retail)       [address]       [place], [place]  [address]       Ph: [phone]       Fax: [phone]   RxID:   [id number] COLACE 100 MG CAPS (DOCUSATE SODIUM) one tablet 2 times daily while on narcotics  #60 x 3   Entered and Authorized by:   Eustaquio Boyden  MD   Signed by:   Eustaquio Boyden  MD on 07/20/2010   Method used:   Electronically to        [facility]* (retail)       [address]       [place], [place]  [address]       Ph: [phone]       Fax: [phone]   RxID:   [id number]    Orders Added: 1)  EKG w/ Interpretation [93000] 2)  TLB-BMP (Basic Metabolic Panel-BMET) [80048-METABOL] 3)  TLB-CBC Platelet - w/Differential [85025-CBCD] 4)  TLB-Hepatic/Liver Function Pnl [80076-HEPATIC] 5)  TLB-TSH (Thyroid Stimulating Hormone) [84443-TSH] 6)  TLB-Lipase [83690-LIPASE] 7)  Est. Patient Level IV [51025]    Current Allergies (reviewed today): No known allergies

## 2010-10-03 NOTE — Progress Notes (Signed)
Summary: pt wants to be admitted to physical therapy  Phone Note From Other Clinic   Caller: Asher Muir at [facility]  [phone] Summary of Call: Pt is trying to be admitted to facility and they need aN FL2 form, history and physical and updated med list.  They will drop off the FL2 to be completed.  Please advise  Initial call taken by: Lowella Petties CMA,  May 18, 2010 3:31 PM  Follow-up for Phone Call        Will complete wonce form arrives. Follow-up by: [name] MD,  May 19, 2010 9:01 AM

## 2010-10-03 NOTE — Progress Notes (Signed)
Summary: refill request for diclofenac  Phone Note Refill Request Message from:  Fax from Pharmacy  Refills Requested: Medication #1:  DICLOFENAC SODIUM 75 MG TBEC take one tablet two times daily   Last Refilled: 02/23/2010 Faxed request from [facility].  Initial call taken by: Lowella Petties CMA,  March 22, 2010 3:02 PM  Follow-up for Phone Call        Call pt/family.[name] Kitchenis this helping with pain or is tramadol helping more?  Follow-up by: [name] MD,  March 22, 2010 3:41 PM  Additional Follow-up for Phone Call Additional follow up Details #1::        Patient says that he cant tell much different with either one.Consuello Masse CMA    Patient doesnt want to medication refilled he says that he has been taken 4-5 goody powders a day and that does help some.Consuello Masse CMA   Additional Follow-up by: Benny Lennert CMA Duncan Dull),  March 22, 2010 3:55 PM    Additional Follow-up for Phone Call Additional follow up Details #2::    4-5 goody powders a day will cause and ulcer. I recommend stopping.  We can try to treat with vicodin as needed if he has tolerated in past...he has upcoming appt scheduled, can wait till then or can start now...discuss with daughter as well please.  Follow-up by: [name] MD,  March 22, 2010 5:28 PM  Additional Follow-up for Phone Call Additional follow up Details #3:: Details for Additional Follow-up Action Taken: Spoke w/ daughter and she is going to talk to him about the good powders. She says that she doesn't think he will try the vicodin because he says nothing helps except for the goody powders.  Additional Follow-up by: Melody Comas,  March 23, 2010 11:48 AM   Appended Document: refill request for diclofenac Advised midown pt does not want diclofenac.

## 2010-10-03 NOTE — Assessment & Plan Note (Signed)
Summary: 3 M F/U DLO   Vital Signs:  Patient profile:   77 year old male Height:      62 inches Weight:      166.2 pounds BMI:     30.51 Temp:     98.0 degrees F oral Pulse rate:   60 / minute Pulse rhythm:   regular BP sitting:   94 / 60  (left arm) Cuff size:   regular  Vitals Entered By: Benny Lennert CMA Duncan Dull) (November 25, 2009 11:00 AM)  History of Present Illness: Chief complaint 3 month follow up  Last cholesterol 08/2009.Marland KitchenLDL 103, total 173, HDL 52 on no medication.  Goal <70 given CAD.  Was on statin in past but caused myalgia.   Chronic phelegm... no improvement with PPI. But heartburn well controlled.   Chronic pain in left pinky..no further surgical options. Scar tissue present and causing pain for patient.  More side effects then benefit from vicodin.  Thyra Breed' poweder helps more than anything else.  Pain in left shoulder and neck.Pt already notified per report.  arthritis in past.     Problems Prior to Update: 1)  Gerd  (ICD-530.81) 2)  Leg Pain, Bilateral  (ICD-729.5) 3)  Cad  (ICD-414.00) 4)  Hypercholesterolemia  (ICD-272.0) 5)  Hypertension  (ICD-401.9) 6)  Arthritis  (ICD-716.90)  Current Medications (verified): 1)  Isosorbide Dinitrate 30 Mg Tabs (Isosorbide Dinitrate) .... Once Daily 2)  Aspir-Low 81 Mg Tbec (Aspirin) .... One A Day 3)  Temazepam 15 Mg Caps (Temazepam) .... As Needed 4)  Cardizem Cd 240 Mg Xr24h-Cap (Diltiazem Hcl Coated Beads) .... 1/2 Tablet Daily 5)  Nitrostat 0.3 Mg Subl (Nitroglycerin) .Marland Kitchen.. 1 As Needed 6)  Lasix 40 Mg Tabs (Furosemide) .Marland Kitchen.. 1 Daily 7)  Potassium Chloride  Gran (Potassium Chloride) .Marland Kitchen.. 1 Daily 8)  Omeprazole 20 Mg Tbec (Omeprazole) .... 2 Tabs By Mouth Daily 9)  Pravastatin Sodium 10 Mg Tabs (Pravastatin Sodium) .Marland Kitchen.. 1 Tab By Mouth Daily 10)  Meloxicam 15 Mg Tabs (Meloxicam) .Marland Kitchen.. 1 Tab By Mouth Daily As Needed For Joint Pain.  Allergies (verified): No Known Drug Allergies  Past History:  Past medical,  surgical, family and social histories (including risk factors) reviewed, and no changes noted (except as noted below).  Past Medical History: Reviewed history from 08/12/2009 and no changes required. Current Problems:  HYPERCHOLESTEROLEMIA (ICD-272.0) HYPERTENSION (ICD-401.9) ARTHRITIS (ICD-716.90), shoulders    Past Surgical History: Reviewed history from 08/12/2009 and no changes required. cardiac cath  ~2007 2010 2 surgery right hand crush injury  right 5th digit contracture, limtied motion lumbar back surgery 2007 : Dr. Jarold Motto (neurosurgeon) knne surgery age 8  Family History: Reviewed history from 08/12/2009 and no changes required.  brother: lung cancer mother: breast cancer  Social History: Reviewed history from 08/12/2009 and no changes required. Occupation: Widow/Widower Never Smoked Alcohol use-no Drug use-no Regular exercise-no  Review of Systems General:  Complains of fatigue; denies fever. CV:  Denies chest pain or discomfort. Resp:  Denies shortness of breath. GI:  Denies abdominal pain and indigestion. GU:  Denies dysuria.  Physical Exam  General:  elderly male in NAd Mouth:  MMM Neck:  no carotid bruit or thyromegaly no cervical or supraclavicular lymphadenopathy  Lungs:  Normal respiratory effort, chest expands symmetrically. Lungs are clear to auscultation, no crackles or wheezes. Heart:  Normal rate and regular rhythm. S1 and S2 normal without gallop, murmur, click, rub or other extra sounds. Abdomen:  Bowel sounds positive,abdomen soft and non-tender without  masses, organomegaly or hernias noted. Msk:  TTP right anterior subacroiminal, pain with external rotation, neg impingement, neg drop arm,  decrease ROM in neck, no vertebral ttp scarring , redness and swelling in right 5th digit diffusely.  Pulses:  R and L posterior tibial pulses are full and equal bilaterally  Extremities:  Trace edema B, B varicosities Skin:  Intact without suspicious  lesions or rashes   Impression & Recommendations:  Problem # 1:  GERD (ICD-530.81) Resolved on PPI. His updated medication list for this problem includes:    Omeprazole 20 Mg Tbec (Omeprazole) .Marland Kitchen... 2 tabs by mouth daily  Problem # 2:  HYPERCHOLESTEROLEMIA (ICD-272.0) Inadequate control..myalgia with high dose crestor..will start low dose pravastain and increase as tolerated. Encouraged exercise, weight loss, healthy eating habits.  His updated medication list for this problem includes:    Pravastatin Sodium 10 Mg Tabs (Pravastatin sodium) .Marland Kitchen... 1 tab by mouth daily  Problem # 3:  ARTHRITIS (ICD-716.90) In nack and right shoulder. Also possible right shoulder bursitis. Treat with heat, ROM exercsies and NSAIDs.   Complete Medication List: 1)  Isosorbide Dinitrate 30 Mg Tabs (Isosorbide dinitrate) .... Once daily 2)  Aspir-low 81 Mg Tbec (Aspirin) .... One a day 3)  Temazepam 15 Mg Caps (Temazepam) .... As needed 4)  Cardizem Cd 240 Mg Xr24h-cap (Diltiazem hcl coated beads) .... 1/2 tablet daily 5)  Nitrostat 0.3 Mg Subl (Nitroglycerin) .Marland Kitchen.. 1 as needed 6)  Lasix 40 Mg Tabs (Furosemide) .Marland Kitchen.. 1 daily 7)  Potassium Chloride Gran (Potassium chloride) .Marland Kitchen.. 1 daily 8)  Omeprazole 20 Mg Tbec (Omeprazole) .... 2 tabs by mouth daily 9)  Pravastatin Sodium 10 Mg Tabs (Pravastatin sodium) .Marland Kitchen.. 1 tab by mouth daily 10)  Meloxicam 15 Mg Tabs (Meloxicam) .Marland Kitchen.. 1 tab by mouth daily as needed for joint pain.  Patient Instructions: 1)  Start pravastatin 20mg  daily. 2)  Use meloxicam for joint pain.  3)  Recheck fasting LIPIDS, AST, ALT  in 3 months Dx 272.0    4)  Please schedule a follow-up appointment in 3 months CPX.  Prescriptions: MELOXICAM 15 MG TABS (MELOXICAM) 1 tab by mouth daily as needed for joint pain.  #30 x 11   Entered and Authorized by:   [name] MD   Signed by:   [name] MD on 11/25/2009   Method used:   Electronically to        [facility]* (retail)       [address]       [place], [place]  [address]       Ph: [phone]       Fax: [phone]   RxID:   [id number] PRAVASTATIN SODIUM 10 MG TABS (PRAVASTATIN SODIUM) 1 tab by mouth daily  #30 x 11   Entered and Authorized by:   [name] MD   Signed by:   [name] MD on 11/25/2009   Method used:   Electronically to        [facility]* (retail)       [address]       [place], [place]  [address]       Ph: [phone]       Fax: [phone]   RxID:   [id number]   Current Allergies (reviewed today): No known allergies

## 2010-10-03 NOTE — Progress Notes (Signed)
Summary: wants referral to ortho  Phone Note Call from Patient   Caller: Daughter  Mariea Clonts [phone] Summary of Call: Pt requests a referral to [name] for the knot on his shoulder. He saw him about 7 years ago for the same problem. Initial call taken by: Lowella Petties CMA,  March 27, 2010 3:58 PM  New Problems: SHOULDER PAIN (ICD-719.41)   New Problems: SHOULDER PAIN (ICD-719.41)

## 2010-10-03 NOTE — Progress Notes (Signed)
----   Converted from flag ---- ---- 05/08/2010 10:41 PM, Earlyne Iba wrote: [name]  Unfortunately this cannot be billed as a CPX, sorry.  [name]  ---- 05/03/2010 10:17 AM, [name] wrote: [name] had a visit on 03-08-2010 for CPX. The physicians did not code it as a cpx. However, per the Mr. Bonsignore, his ins will pay 100% if cpx.  Will you review to see if we this claim can be resubmitted as a V70.0.Marland KitchenMarland Kitchen MR# [id number]... [name] ------------------------------

## 2010-10-03 NOTE — Progress Notes (Signed)
Summary: Rx Omeprazole  Phone Note Call from Patient Call back at [phone]   Caller: Daughter/[name] Reed Call For: [name] MD Summary of Call: Father has complained all weekend of heartburn.  States that he cannot get his medication unless the doctors office calls the pharmacy.  Advised daughter that we will send in a Rx for Omeprazole to [facility] per her request.  She says that her dad gets confused and doesn't understand things that people are trying to tell him.   Initial call taken by: Linde Gillis CMA Duncan Dull),  November 07, 2009 8:47 AM

## 2010-10-03 NOTE — Assessment & Plan Note (Signed)
Summary: Arleene Settle B12/RBH  Nurse Visit   Allergies: No Known Drug Allergies  Medication Administration  Injection # 1:    Medication: Vit B12 1000 mcg    Diagnosis: VITAMIN B12 DEFICIENCY (ICD-266.2)    Route: IM    Site: R deltoid    Exp Date: 07/05/2011    Lot #: [id number]    Mfr: [employer]    Patient tolerated injection without complications    Given by: Linde Gillis CMA Duncan Dull) (Jan 04, 2010 10:23 AM)  Orders Added: 1)  Vit B12 1000 mcg [J3420] 2)  Admin of Therapeutic Inj  intramuscular or subcutaneous [96372]  Per labs on 01/02/2010 Dr. Ermalene Searing wanted patient to get a Vitamin B12 injection x 1.  Linde Gillis CMA Duncan Dull)  Jan 04, 2010 10:24 AM

## 2010-10-03 NOTE — Assessment & Plan Note (Signed)
Summary: 30 min 1 month follow up/rbh   Vital Signs:  Patient profile:   77 year old male Height:      62 inches Weight:      165.8 pounds BMI:     30.43 Temp:     98.0 degrees F oral Pulse rate:   64 / minute Pulse rhythm:   regular BP sitting:   140 / 92  (left arm) Cuff size:   regular  Vitals Entered By: Benny Lennert CMA Duncan Dull) (April 07, 2010 3:19 PM)  History of Present Illness: Chief complaint follow up appt  Very complicated 77 year old male here for follow up.  Recent hospitalization 6/5-6/6 for weakness and confusion...unclear cause, resolved on its own.  MRI: showed atrophy, chronic microvascular ischemia.Marland Kitchenno clear  stroke. CXR neg, Carotid doplers neg. Hg nml, platelets lowl,TSH nml, CE neg..  Thrombocytopenia...cbc plt nml 6/29...resolved.   Pt and graddaughter report..sudden leg weakness and confusion. Since out of hopsital..doing well except some intermittant neck pain.   Family has been noticing some baseline dementia .. gradually worsening. Last OV started on aricept.  Daughter took me aside and noted pt confusion is worse than he lets on.  April labs and peripheral dopplers were normal in 12/2008 looking into episodic leg weakness and dizzyness.   Right 5th digit..meloxicam remains red and stiff, contracture present.Notes pain from finger to arm.   Cervical spine films showed significant cervical changes...arthritis Recommended meloxicam... helping minimally. Given tramdol since last OV...felt like made pain worse.  Neck pain, chronic: He has severe degenerative changes in cervical spine.Marland KitchenMarland KitchenHe states no benifit with any antiinflammatory, but also confused about whether tried it.  He refuses referral to PMR for injections.   CAD, Dr.  Melburn Popper..taken off diliazem..due to lower BP and pulse. BP improved of med..less dizziness now.  He stopped taking lasix and potassium on his own 3 days ago to see if is causing weak spells.   In last 2 weeks had nml  ECHO.  LAt OV stopped  isosorbide.  High cholesterol...stopped pravastatin on his own 3 days go    Insomnia, trazodone given for sleep in past  Minimal benefit.  Last OV given lunesta for sleep....but never started this due to price.   Problems Prior to Update: 1)  Shoulder Pain  (ICD-719.41) 2)  Insomnia, Chronic  (ICD-307.42) 3)  Finger Pain  (ICD-729.5) 4)  Neck Pain, Acute  (ICD-723.1) 5)  Thrombocytopenia  (ICD-287.5) 6)  Dementia, Mild  (ICD-294.8) 7)  Vitamin B12 Deficiency  (ICD-266.2) 8)  Unspecified Hypothyroidism  (ICD-244.9) 9)  Calf Pain, Bilateral  (ICD-729.5) 10)  Gerd  (ICD-530.81) 11)  Leg Pain, Bilateral  (ICD-729.5) 12)  Cad  (ICD-414.00) 13)  Hypercholesterolemia  (ICD-272.0) 14)  Hypertension  (ICD-401.9) 15)  Arthritis  (ICD-716.90)  Current Medications (verified): 1)  Aspir-Low 81 Mg Tbec (Aspirin) .... One A Day 2)  Nitrostat 0.3 Mg Subl (Nitroglycerin) .Marland Kitchen.. 1 As Needed 3)  Lasix 40 Mg Tabs (Furosemide) .Marland Kitchen.. 1 By Mouth Daily As Needed For Weight Gain/swelling. 4)  Potassium Chloride  Gran (Potassium Chloride) .Marland Kitchen.. 1 By Mouth Daily As Needed With Lasix 5)  Donepezil Hcl 10 Mg Tabs (Donepezil Hcl) .Marland Kitchen.. 1 Tab By Mouth Daily 6)  Hydrocodone-Acetaminophen 5-500 Mg Tabs (Hydrocodone-Acetaminophen) .Marland Kitchen.. 1 Tab By Mouth Every 6 Hour For Pain  Allergies (verified): No Known Drug Allergies  Past History:  Past medical, surgical, family and social histories (including risk factors) reviewed, and no changes noted (except as noted below).  Past Medical History: Reviewed history from 08/12/2009 and no changes required. Current Problems:  HYPERCHOLESTEROLEMIA (ICD-272.0) HYPERTENSION (ICD-401.9) ARTHRITIS (ICD-716.90), shoulders    Past Surgical History: Reviewed history from 08/12/2009 and no changes required. cardiac cath  ~2007 2010 2 surgery right hand crush injury  right 5th digit contracture, limtied motion lumbar back surgery 2007 : Dr. Jarold Motto  (neurosurgeon) knne surgery age 32  Family History: Reviewed history from 08/12/2009 and no changes required.  brother: lung cancer mother: breast cancer  Social History: Reviewed history from 08/12/2009 and no changes required. Occupation: Widow/Widower Never Smoked Alcohol use-no Drug use-no Regular exercise-no  Review of Systems General:  Complains of fatigue; denies fever. CV:  Denies chest pain or discomfort. Resp:  Denies shortness of breath. GI:  Denies abdominal pain. GU:  Denies dysuria.  Physical Exam  General:  elderly male in NAD Mouth:  MMM Neck:  no carotid bruit or thyromegaly no cervical or supraclavicular lymphadenopathy  Lungs:  Normal respiratory effort, chest expands symmetrically. Lungs are clear to auscultation, no crackles or wheezes. Heart:  Normal rate and regular rhythm. S1 and S2 normal without gallop, murmur, click, rub or other extra sounds. Abdomen:  Bowel sounds positive,abdomen soft and non-tender without masses, organomegaly or hernias noted. Msk:  TTP over central cervical spine and B paraspinous muscles Neg Spurling's  significant decrease ROM in neck, no vertebral ttp scarring , redness and swelling in right 5th digit diffusely.  Pulses:  R and L posterior tibial pulses are full and equal bilaterally  Extremities:  1 plus edema B, B varicosities Psych:  Oriented X3, normally interactive, good eye contact, not anxious appearing, not depressed appearing, but some  memory impairment.     Impression & Recommendations:  Problem # 1:  CAD (ICD-414.00) Recommended restarting heart meds and to use lasix/potassium as needed. Follow weights closely. The following medications were removed from the medication list:    Isosorbide Dinitrate 30 Mg Tabs (Isosorbide dinitrate) ..... Once daily    Cardizem Cd 240 Mg Xr24h-cap (Diltiazem hcl coated beads) .Marland Kitchen... 1/2 tablet daily His updated medication list for this problem includes:    Aspir-low 81 Mg  Tbec (Aspirin) ..... One a day    Nitrostat 0.3 Mg Subl (Nitroglycerin) .Marland Kitchen... 1 as needed    Lasix 40 Mg Tabs (Furosemide) .Marland Kitchen... 1 by mouth daily as needed for weight gain/swelling.  Problem # 2:  LEG PAIN, BILATERAL (ICD-729.5) Given leg pain..I agree with holding pravatain temporarily to determine if this is contributing with symptoms.   Problem # 3:  NECK PAIN, ACUTE (ICD-723.1) Likly cause of radilcular symptoms in arm and finger and shoulder... vicodin as needed pain. Not interested in referral for steroifdinjections in neck. "I have been to too many doctors and I am on too many medicaitons" His updated medication list for this problem includes:    Aspir-low 81 Mg Tbec (Aspirin) ..... One a day    Hydrocodone-acetaminophen 5-500 Mg Tabs (Hydrocodone-acetaminophen) .Marland Kitchen... 1 tab by mouth every 6 hour for pain  Problem # 4:  THROMBOCYTOPENIA (ICD-287.5) resolved.   Problem # 5:  HYPERTENSION (ICD-401.9) Moderate control...recommended him to restart CV medicaitons like isosorbide. Will follow BP..had recent orthostatic hypotension and was taken off cardiazem for this.  The following medications were removed from the medication list:    Cardizem Cd 240 Mg Xr24h-cap (Diltiazem hcl coated beads) .Marland Kitchen... 1/2 tablet daily His updated medication list for this problem includes:    Lasix 40 Mg Tabs (Furosemide) .Marland Kitchen... 1 by mouth  daily as needed for weight gain/swelling.  Complete Medication List: 1)  Aspir-low 81 Mg Tbec (Aspirin) .... One a day 2)  Nitrostat 0.3 Mg Subl (Nitroglycerin) .Marland Kitchen.. 1 as needed 3)  Lasix 40 Mg Tabs (Furosemide) .Marland Kitchen.. 1 by mouth daily as needed for weight gain/swelling. 4)  Potassium Chloride Gran (Potassium chloride) .Marland Kitchen.. 1 by mouth daily as needed with lasix 5)  Donepezil Hcl 10 Mg Tabs (Donepezil hcl) .Marland Kitchen.. 1 tab by mouth daily 6)  Hydrocodone-acetaminophen 5-500 Mg Tabs (Hydrocodone-acetaminophen) .Marland Kitchen.. 1 tab by mouth every 6 hour for pain  Patient Instructions: 1)   Average weight 165. 2)   Check  daily weights..if trending up more than 2-3 lbs in a day... take lasix with potassium. 3)   Otherwise it is okay to hold. 4)  Restart pravastatin if no improvement in weakness. 5)   Use vicodin for pain, call if not helping.  6)  Please schedule a follow-up appointment in 1 month 30 min Prescriptions: HYDROCODONE-ACETAMINOPHEN 5-500 MG TABS (HYDROCODONE-ACETAMINOPHEN) 1 tab by mouth every 6 hour for pain  #60 x 0   Entered and Authorized by:   [name] MD   Signed by:   [name] MD on 04/07/2010   Method used:   Print then Give to Patient   RxID:   [id number] HYDROCODONE-ACETAMINOPHEN 5-500 MG TABS (HYDROCODONE-ACETAMINOPHEN) 1 tab by mouth every 6 hour for pain  #60 x 0   Entered and Authorized by:   [name] MD   Signed by:   [name] MD on 04/07/2010   Method used:   Print then Give to Patient   RxID:   [id number]   Current Allergies (reviewed today): No known allergies

## 2010-10-03 NOTE — Progress Notes (Signed)
Summary: can't sleep, shoulder and neck pain  Phone Note Call from Patient Call back at [phone]   Caller: Patient Call For: [name] MD/Dr. Eyleen Rawlinson Summary of Call: Patient is still having alot of shoulder and neck pain. Daughter feels that he needs something else to help with this. She says that the meloxicam is not helping. He is also having trouble sleeping at night. She wants to know if he can have something to help with that. Uses [facility].  Initial call taken by: Melody Comas,  February 23, 2010 8:54 AM  Follow-up for Phone Call        would like to avoid overmedicating in elderly pt that i do not know  send in d/c meloxicam voltaren 75 mg, 1 by mouth two times a day, #30, 0 refills for significant pain, tramadol 50 mg, 1 by mouth 4 times daily as needed pain, #40, 0 refills  should have recheck with Dr. Ermalene Searing in a few weeks I believe Follow-up by: Hannah Beat MD,  February 23, 2010 9:01 AM  Additional Follow-up for Phone Call Additional follow up Details #1::        rx sent to pharmacy and patient advised.Consuello Masse CMA   Additional Follow-up by: Benny Lennert CMA Duncan Dull),  February 23, 2010 9:18 AM    New/Updated Medications: DICLOFENAC SODIUM 75 MG TBEC (DICLOFENAC SODIUM) take one tablet two times daily TRAMADOL HCL 50 MG TABS (TRAMADOL HCL) take one tablet up to 4 times daily Prescriptions: TRAMADOL HCL 50 MG TABS (TRAMADOL HCL) take one tablet up to 4 times daily  #40 x 0   Entered by:   Benny Lennert CMA (AAMA)   Authorized by:   Hannah Beat MD   Signed by:   Benny Lennert CMA (AAMA) on 02/23/2010   Method used:   Electronically to        [facility]* (retail)       [address]       [place], [place]  [address]       Ph: [phone]       Fax: [phone]   RxID:   [id number] DICLOFENAC SODIUM 75 MG TBEC (DICLOFENAC SODIUM) take one tablet two times daily  #30 x 0   Entered by:   Benny Lennert CMA (AAMA)   Authorized by:   Hannah Beat MD   Signed by:   Benny Lennert CMA (AAMA) on 02/23/2010   Method used:   Electronically to        [facility]* (retail)       [address]       [place], [place]  [address]       Ph: [phone]       Fax: [phone]   RxID:   [id number]

## 2010-10-03 NOTE — Progress Notes (Signed)
Summary: can't afford $50  Phone Note Call from Patient Call back at [phone]   Caller: Patient Call For: [name] MD Summary of Call: Patient's daughter called crying stating that she can not afford the $50 dollar fee for the fmla forms that were filled out. She is asking if there is any way this can be reduced or waived. Please advise.  Initial call taken by: Melody Comas,  April 14, 2010 3:54 PM  Follow-up for Phone Call        Waive fee. Follow-up by: [name] MD,  April 14, 2010 4:27 PM  Additional Follow-up for Phone Call Additional follow up Details #1::        ok, I will send to Pt accting to waiver the fee. Called pts daughter to inform.[name]  April 17, 2010 9:00 AM  Additional Follow-up by: [name],  April 17, 2010 9:00 AM     Appended Document: can't afford $50 [employer], charges has not been entered into the charge system. Voided request to bill pt...per Dr. Ermalene Searing..cdavis 04-17-2010

## 2010-10-03 NOTE — Progress Notes (Signed)
Summary: regarding pt's dizziness.  Phone Note Call from Patient   Caller: DaughterAgustin Cree  [phone] Summary of Call: Pt's daughter is asking if you have determined what is wrong with the patient- regarding his dizziness.  I told her that his lab work is back but that you have not reviewed it.  She asks if he could have vertigo, I told her that is more of a room spinning type of dizziness than just feeling dizzy and light headed.  Please advise. Initial call taken by: Lowella Petties CMA,  Jan 02, 2010 10:34 AM  Follow-up for Phone Call        Agree..not typical of vertigo.  See lab addendum for further info/recs. Follow-up by: [name] MD,  Jan 02, 2010 11:43 AM  Additional Follow-up for Phone Call Additional follow up Details #1::        patient daughter advised.Consuello Masse CMA  Additional Follow-up by: Benny Lennert CMA Duncan Dull),  Jan 02, 2010 12:52 PM

## 2010-10-03 NOTE — Progress Notes (Signed)
Summary: call a nurse  Phone Note From Other Clinic   Caller: call a nurse Summary of Call: [facility] Triage Call Report Triage Record Num: [id number] Operator: Josephina Gip Patient Name: [name] Elliott Call Date & Time: 02/05/2010 1:56:19PM Patient Phone: [phone] PCP: [name] Patient Gender: Male PCP Fax : Patient DOB: [birth year] Practice Name: [facility] Reason for Call: [name]/daughter is calling. States went to see Father today and he is not acting right. No memory of current events and c/o weakness and can't walk .Daughter instructed to hang up and call 911. She states that she understands. Protocol(s) Used: Weakness / Paralysis Recommended Outcome per Protocol: Activate EMS 911 Reason for Outcome: Acute onset of weakness or paralysis associated with loss of coordination (purposeful action) or numbness/tingling of any part of the body Care Advice:  ~ An adult should stay with the patient, preferably one trained in CPR. 02/05/2010 2:03:06PM Page 1 of 1 [email or web address] Initial call taken by: Lowella Petties CMA,  February 06, 2010 10:04 AM

## 2010-10-03 NOTE — Progress Notes (Signed)
Summary: Dizzy, legs numb  Phone Note Call from Patient Call back at [phone]   Caller: Daughter/[name] Reed Call For: [name] MD Summary of Call: Patient is complaining of his legs from the knees down feeling numb.  Feeling dizzy and lightheaded like he is going to pass out.  The issue with his legs has been going on for about a week now but he just started feeling lightheaded and dizzy on yesterday.  No medication changes, no wheezing, no SOB.  Daughter would like him to be seen tomorrow by Dr. Ermalene Searing, she has no appts available.  She says that if her dad does not get in to be seen he will more than likely find another doctor.  Please advise. Initial call taken by: Linde Gillis CMA Duncan Dull),  December 29, 2009 10:11 AM  Follow-up for Phone Call        patient daughter advised.Consuello Masse CMA  Follow-up by: Benny Lennert CMA Duncan Dull),  December 29, 2009 10:17 AM

## 2010-10-03 NOTE — Miscellaneous (Signed)
Summary: Orders Update  Clinical Lists Changes  Orders: Added new Test order of Arterial Duplex Lower Extremity (Arterial Duplex Low) - Signed 

## 2010-10-03 NOTE — Assessment & Plan Note (Signed)
Summary: 3:00 F/U [facility]  D/C 02/07/10/[facility]   Vital Signs:  Patient profile:   77 year old male Height:      62 inches Weight:      167.4 pounds BMI:     30.73 Temp:     98.0 degrees F oral Pulse rate:   64 / minute Pulse rhythm:   regular BP sitting:   100 / 60  (left arm) Cuff size:   regular  Vitals Entered By: Benny Lennert CMA Duncan Dull) (February 10, 2010 11:19 AM)  History of Present Illness: Chief complaint follow up hospital discharge 02-07-10   Recent hospitalization 6/5-6/6 for weakness and confusion...unclear cause, resolved on its own.  MRI: showed atrophy, chronic microvascular ischemia.Marland Kitchenno lare stroke.  CXR neg, Carotid doplers neg. Hg nml, platelets lowl,TSh nml, CE neg.  Pt and graddaughter report..sudden leg weakness and confusion. Since out of hopsital..doing well except some intermittant neck pain.   Family has been noticing some baseline dementia .. gradually worsening.   April labs and peripheral dopplers were normal in 12/2008 looking into episodic leg weakness and dizzyness.    Right 5th digit..meloxicam remains red and stiff, contracture present. Per pt meloxicam did not help much. Notes pain from finger to arm.    CAD, Dr.  Melburn Popper..taken off diliazem..due to lower BP and pulse.   Problems Prior to Update: 1)  Finger Pain  (ICD-729.5) 2)  Unspecified Thrombocytopenia  (ICD-287.5) 3)  Neck Pain, Acute  (ICD-723.1) 4)  Thrombocytopenia  (ICD-287.5) 5)  Dementia, Mild  (ICD-294.8) 6)  Vitamin B12 Deficiency  (ICD-266.2) 7)  Unspecified Hypothyroidism  (ICD-244.9) 8)  Calf Pain, Bilateral  (ICD-729.5) 9)  Paresthesia  (ICD-782.0) 10)  Dizziness  (ICD-780.4) 11)  Gerd  (ICD-530.81) 12)  Leg Pain, Bilateral  (ICD-729.5) 13)  Cad  (ICD-414.00) 14)  Hypercholesterolemia  (ICD-272.0) 15)  Hypertension  (ICD-401.9) 16)  Arthritis  (ICD-716.90)  Current Medications (verified): 1)  Isosorbide Dinitrate 30 Mg Tabs (Isosorbide Dinitrate) .... Once  Daily 2)  Aspir-Low 81 Mg Tbec (Aspirin) .... One A Day 3)  Temazepam 15 Mg Caps (Temazepam) .... As Needed 4)  Cardizem Cd 240 Mg Xr24h-Cap (Diltiazem Hcl Coated Beads) .... 1/2 Tablet Daily 5)  Nitrostat 0.3 Mg Subl (Nitroglycerin) .Marland Kitchen.. 1 As Needed 6)  Lasix 40 Mg Tabs (Furosemide) .Marland Kitchen.. 1 Daily 7)  Potassium Chloride  Gran (Potassium Chloride) .Marland Kitchen.. 1 Daily 8)  Omeprazole 20 Mg Tbec (Omeprazole) .... 2 Tabs By Mouth Daily 9)  Pravastatin Sodium 10 Mg Tabs (Pravastatin Sodium) .Marland Kitchen.. 1 Tab By Mouth Daily 10)  Meloxicam 15 Mg Tabs (Meloxicam) .Marland Kitchen.. 1 Tab By Mouth Daily As Needed For Joint Pain. 11)  Donepezil Hcl 10 Mg Tabs (Donepezil Hcl) .Marland Kitchen.. 1 Tab By Mouth Daily  Allergies (verified): No Known Drug Allergies  Past History:  Past medical, surgical, family and social histories (including risk factors) reviewed, and no changes noted (except as noted below).  Past Medical History: Reviewed history from 08/12/2009 and no changes required. Current Problems:  HYPERCHOLESTEROLEMIA (ICD-272.0) HYPERTENSION (ICD-401.9) ARTHRITIS (ICD-716.90), shoulders    Past Surgical History: Reviewed history from 08/12/2009 and no changes required. cardiac cath  ~2007 2010 2 surgery right hand crush injury  right 5th digit contracture, limtied motion lumbar back surgery 2007 : Dr. Jarold Motto (neurosurgeon) knne surgery age 21  Family History: Reviewed history from 08/12/2009 and no changes required.  brother: lung cancer mother: breast cancer  Social History: Reviewed history from 08/12/2009 and no changes required. Occupation: Widow/Widower Never  Smoked Alcohol use-no Drug use-no Regular exercise-no  Review of Systems General:  Complains of fatigue; denies fever and malaise. ENT:  Complains of nasal congestion; denies earache, postnasal drainage, and sore throat. CV:  Denies chest pain or discomfort. Resp:  Denies shortness of breath. GI:  Denies abdominal pain, bloody stools,  constipation, and diarrhea. GU:  Denies dysuria and hematuria. Derm:  Denies rash. Psych:  Denies anxiety, depression, and suicidal thoughts/plans.  Physical Exam  General:  elderly male in NAD Ears:  External ear exam shows no significant lesions or deformities.  Otoscopic examination reveals clear canals, tympanic membranes are intact bilaterally without bulging, retraction, inflammation or discharge. Hearing is grossly normal bilaterally. Nose:  External nasal examination shows no deformity or inflammation. Nasal mucosa are pink and moist without lesions or exudates. Mouth:  MMM Neck:  no carotid bruit or thyromegaly no cervical or supraclavicular lymphadenopathy  Lungs:  Normal respiratory effort, chest expands symmetrically. Lungs are clear to auscultation, no crackles or wheezes. Heart:  Normal rate and regular rhythm. S1 and S2 normal without gallop, murmur, click, rub or other extra sounds. Abdomen:  Bowel sounds positive,abdomen soft and non-tender without masses, organomegaly or hernias noted. Msk:  TTP over central cervical spine and B paraspinous muscles Neg Spurling's  significant decrease ROM in neck, no vertebral ttp scarring , redness and swelling in right 5th digit diffusely.  Pulses:  R and L posterior tibial pulses are full and equal bilaterally  Extremities:  1 plus edema B, B varicosities Neurologic:  No cranial nerve deficits noted. Station and gait are normal. DTRs are symmetrical throughout. Sensory, motor and coordinative functions appear intact. Skin:  Intact without suspicious lesions or rashes Psych:  Oriented X3, normally interactive, good eye contact, not anxious appearing, not depressed appearing, and memory impairment.     Impression & Recommendations:  Problem # 1:  DIZZINESS (ICD-780.4) Unclear cause.   Problem # 2:  DEMENTIA, MILD (ICD-294.8) Discussed in detail. LAb eval neg. Evidence on MRi of atrophy buit nonspecific cahnges. Start aricept to  [facility].   Problem # 3:  THROMBOCYTOPENIA (ICD-287.5) Noted in hosiptal. Reeval for change.  Orders: TLB-CBC Platelet - w/Differential (85025-CBCD)  Problem # 4:  NECK PAIN, ACUTE (ICD-723.1) Minimal cervical spine motion likely du to arthritis. No sign of radiculopathy. Eval with X-rays. Diclofenac and tramadol for breakthru pain.  His updated medication list for this problem includes:    Aspir-low 81 Mg Tbec (Aspirin) ..... One a day    Diclofenac Sodium 75 Mg Tbec (Diclofenac sodium) .Marland Kitchen... Take one tablet two times daily    Tramadol Hcl 50 Mg Tabs (Tramadol hcl) .Marland Kitchen... Take one tablet up to 4 times daily  Orders: T-Cervical Spine Comp 4 Views (72050TC)  Problem # 5:  LEG PAIN, BILATERAL (ICD-729.5) Uncelar cause. ABIs negative.   Problem # 6:  FINGER PAIN (ICD-729.5) Following surgery.  Treat with pain medicaiton as stated previously.   Complete Medication List: 1)  Isosorbide Dinitrate 30 Mg Tabs (Isosorbide dinitrate) .... Once daily 2)  Aspir-low 81 Mg Tbec (Aspirin) .... One a day 3)  Temazepam 15 Mg Caps (Temazepam) .... As needed 4)  Cardizem Cd 240 Mg Xr24h-cap (Diltiazem hcl coated beads) .... 1/2 tablet daily 5)  Nitrostat 0.3 Mg Subl (Nitroglycerin) .Marland Kitchen.. 1 as needed 6)  Lasix 40 Mg Tabs (Furosemide) .Marland Kitchen.. 1 daily 7)  Potassium Chloride Gran (Potassium chloride) .Marland Kitchen.. 1 daily 8)  Omeprazole 20 Mg Tbec (Omeprazole) .... 2 tabs by mouth daily  9)  Pravastatin Sodium 10 Mg Tabs (Pravastatin sodium) .Marland Kitchen.. 1 tab by mouth daily 10)  Donepezil Hcl 10 Mg Tabs (Donepezil hcl) .Marland Kitchen.. 1 tab by mouth daily 11)  Diclofenac Sodium 75 Mg Tbec (Diclofenac sodium) .... Take one tablet two times daily 12)  Tramadol Hcl 50 Mg Tabs (Tramadol hcl) .... Take one tablet up to 4 times daily   Patient Instructions: 1)  Start aricept for memory. 2)  if not using meloxicam..start daily and see if improvement in neck and finger pain. 3)   Call if not taking.  4)   Follow up in 1 month 30  min OV.   Prescriptions: DONEPEZIL HCL 10 MG TABS (DONEPEZIL HCL) 1 tab by mouth daily  #30 x 11   Entered and Authorized by:   [name] MD   Signed by:   [name] MD on 02/10/2010   Method used:   Electronically to        [facility]* (retail)       [id number]       [place], [place]  [address]       Ph: [phone]       Fax: [phone]   RxID:   [id number]   Current Allergies (reviewed today): No known allergies

## 2010-10-03 NOTE — Assessment & Plan Note (Signed)
Summary: leg numbness and dizzy/hmw   Vital Signs:  Patient profile:   77 year old male Height:      62 inches Weight:      167.0 pounds BMI:     30.66 Temp:     97.5 degrees F oral Pulse rate:   64 / minute Pulse rhythm:   regular BP sitting:   110 / 70  (left arm) Cuff size:   regular  Vitals Entered By: Benny Lennert CMA Duncan Dull) (December 30, 2009 9:08 AM) CBG Result 93   History of Present Illness: Chief complaint leg numbness and dizziness (patient says that he doesnt have any energy)   Having episodes spontaneously of sudden weakness, dizzyness. Not ongoing now but occured in past 2 days. Sits down in chair for 1 hour and symptoms resolve on there own. Feels flushed in face..body is hot.  Left mid back pain. Does have pain in calves with walking from house to mailbox. Seems to occur after he eats.  Both legs begin tingling, knees down. Feels like he has to sit down. Feels presyncopal. No LOC.    No chest pain, no palpitations, no shortness breath.  NO fever.    Problems Prior to Update: 1)  Calf Pain, Bilateral  (ICD-729.5) 2)  Paresthesia  (ICD-782.0) 3)  Dizziness  (ICD-780.4) 4)  Gerd  (ICD-530.81) 5)  Leg Pain, Bilateral  (ICD-729.5) 6)  Cad  (ICD-414.00) 7)  Hypercholesterolemia  (ICD-272.0) 8)  Hypertension  (ICD-401.9) 9)  Arthritis  (ICD-716.90)  Current Medications (verified): 1)  Isosorbide Dinitrate 30 Mg Tabs (Isosorbide Dinitrate) .... Once Daily 2)  Aspir-Low 81 Mg Tbec (Aspirin) .... One A Day 3)  Temazepam 15 Mg Caps (Temazepam) .... As Needed 4)  Cardizem Cd 240 Mg Xr24h-Cap (Diltiazem Hcl Coated Beads) .... 1/2 Tablet Daily 5)  Nitrostat 0.3 Mg Subl (Nitroglycerin) .Marland Kitchen.. 1 As Needed 6)  Lasix 40 Mg Tabs (Furosemide) .Marland Kitchen.. 1 Daily 7)  Potassium Chloride  Gran (Potassium Chloride) .Marland Kitchen.. 1 Daily 8)  Omeprazole 20 Mg Tbec (Omeprazole) .... 2 Tabs By Mouth Daily 9)  Pravastatin Sodium 10 Mg Tabs (Pravastatin Sodium) .Marland Kitchen.. 1 Tab By Mouth Daily 10)   Meloxicam 15 Mg Tabs (Meloxicam) .Marland Kitchen.. 1 Tab By Mouth Daily As Needed For Joint Pain.  Allergies (verified): No Known Drug Allergies  Past History:  Past medical, surgical, family and social histories (including risk factors) reviewed, and no changes noted (except as noted below).  Past Medical History: Reviewed history from 08/12/2009 and no changes required. Current Problems:  HYPERCHOLESTEROLEMIA (ICD-272.0) HYPERTENSION (ICD-401.9) ARTHRITIS (ICD-716.90), shoulders    Past Surgical History: Reviewed history from 08/12/2009 and no changes required. cardiac cath  ~2007 2010 2 surgery right hand crush injury  right 5th digit contracture, limtied motion lumbar back surgery 2007 : Dr. Jarold Motto (neurosurgeon) knne surgery age 29  Family History: Reviewed history from 08/12/2009 and no changes required.  brother: lung cancer mother: breast cancer  Social History: Reviewed history from 08/12/2009 and no changes required. Occupation: Widow/Widower Never Smoked Alcohol use-no Drug use-no Regular exercise-no  Review of Systems General:  Complains of fatigue; denies fever, loss of appetite, and weight loss. CV:  Denies chest pain or discomfort. Resp:  Denies shortness of breath, sputum productive, and wheezing. GI:  Denies abdominal pain. GU:  Denies dysuria.  Physical Exam  General:  elderly male in NAD Head:  Normocephalic and atraumatic without obvious abnormalities.  Eyes:  No corneal or conjunctival inflammation noted. EOMI. Perrla. Funduscopic exam benign,  without hemorrhages, exudates or papilledema. Vision grossly normal. Ears:  External ear exam shows no significant lesions or deformities.  Otoscopic examination reveals clear canals, tympanic membranes are intact bilaterally without bulging, retraction, inflammation or discharge. Hearing is grossly normal bilaterally. Nose:  External nasal examination shows no deformity or inflammation. Nasal mucosa are pink and  moist without lesions or exudates. Mouth:  MMM Neck:  no carotid bruit or thyromegaly no cervical or supraclavicular lymphadenopathy  Lungs:  Normal respiratory effort, chest expands symmetrically. Lungs are clear to auscultation, no crackles or wheezes. Heart:  Normal rate and regular rhythm. S1 and S2 normal without gallop, murmur, click, rub or other extra sounds. Abdomen:  Bowel sounds positive,abdomen soft and non-tender without masses, organomegaly or hernias noted. Pulses:  diminished B Extremities:  1 plus edema B, B varicosities Neurologic:  No cranial nerve deficits noted. Station and gait are normal.DTRs are symmetrical throughout. Sensory, motor and coordinative functions appear intact.   Impression & Recommendations:  Problem # 1:  DIZZINESS (ICD-780.4)  Presyncope.  With cardiac history..concern for cardiac cause..mild brady on EKG..stable from last chceck.  Recommend follow up with his cardiologist for further heart eval in next 1-2 weeks. Not clearly orthostatic hypotension, but possible.  No suggestion of hypoglycemia  No clear UTI.  Will eval for anemia, thyroid, B12 issues.  ? neuro souce.Marland KitchenTIAs given #2..conisder MRI brain if initial work up negative.  Orders: UA Dipstick W/ Micro (manual) (16109) EKG w/ Interpretation (93000) Glucose, (CBG) (60454) Specimen Handling (99000) T-Comprehensive Metabolic Panel [id number]) T-CBC w/Diff (29562-13086) T-TSH (57846-96295) T-Vitamin B12 (28413-24401) T-Folic Acid; RBC (02725-36644)  Problem # 2:  PARESTHESIA (ICD-782.0) Unclear casue.. nml neuro exam.   Eval with labs..consider imaging/neuro referral.  Orders: Specimen Handling (03474) T-Comprehensive Metabolic Panel [phone]) T-CBC w/Diff [id number]) T-TSH [phone]) T-Vitamin B12 (10932-35573) T-Folic Acid; RBC (22025-42706)  Problem # 3:  CALF PAIN, BILATERAL (ICD-729.5)  Will eval for PAd with B ABIs given diminished pulses B and symptoms.    Orders: Specimen Handling (23762) T-Comprehensive Metabolic Panel [id number]) T-CBC w/Diff [id number]) T-TSH [id number]) T-Vitamin B12 [phone]) T-Folic Acid; RBC (71696-78938) Radiology Referral (Radiology)  Complete Medication List: 1)  Isosorbide Dinitrate 30 Mg Tabs (Isosorbide dinitrate) .... Once daily 2)  Aspir-low 81 Mg Tbec (Aspirin) .... One a day 3)  Temazepam 15 Mg Caps (Temazepam) .... As needed 4)  Cardizem Cd 240 Mg Xr24h-cap (Diltiazem hcl coated beads) .... 1/2 tablet daily 5)  Nitrostat 0.3 Mg Subl (Nitroglycerin) .Marland Kitchen.. 1 as needed 6)  Lasix 40 Mg Tabs (Furosemide) .Marland Kitchen.. 1 daily 7)  Potassium Chloride Gran (Potassium chloride) .Marland Kitchen.. 1 daily 8)  Omeprazole 20 Mg Tbec (Omeprazole) .... 2 tabs by mouth daily 9)  Pravastatin Sodium 10 Mg Tabs (Pravastatin sodium) .Marland Kitchen.. 1 tab by mouth daily 10)  Meloxicam 15 Mg Tabs (Meloxicam) .Marland Kitchen.. 1 tab by mouth daily as needed for joint pain.  Patient Instructions: 1)  MAke an appt with Cardiologist Dr. Melburn Popper in next week. 2)   Go to ER for chest pain, shortness of breath.  3)  Referral Appointment Information 4)  Day/Date: 5)  Time: 6)  Place/MD: 7)  Address: 8)  Phone/Fax: 9)  Patient given appointment information. Information/Orders faxed/mailed.   Current Allergies (reviewed today): No known allergies   Laboratory Results   Urine Tests  Date/Time Received: December 30, 2009 10:09 AM  Date/Time Reported: December 30, 2009 10:09 AM   Routine Urinalysis   Color: lt. yellow Appearance: Clear Glucose: negative   (Normal Range: Negative)  Bilirubin: negative   (Normal Range: Negative) Ketone: negative   (Normal Range: Negative) Spec. Gravity: <1.005   (Normal Range: 1.003-1.035) Blood: negative   (Normal Range: Negative) pH: 5.0   (Normal Range: 5.0-8.0) Protein: negative   (Normal Range: Negative) Urobilinogen: 0.2   (Normal Range: 0-1) Nitrite: negative   (Normal Range: Negative) Leukocyte Esterace: negative    (Normal Range: Negative)     Blood Tests     CBG Random:: 93mg /dL

## 2010-10-03 NOTE — Letter (Signed)
Summary: FMLA Form  FMLA Form   Imported By: Beau Fanny 04/14/2010 15:27:46  _____________________________________________________________________  External Attachment:    Type:   Image     Comment:   External Document

## 2010-10-03 NOTE — Progress Notes (Signed)
Summary: needs to change from avalox  Phone Note From Pharmacy   Caller: Rob, [facility] Summary of Call: Pt fell over the week end , went to the hospital and was given avalox.  He cant afford this and Rob is asking if you can change to a less expensive abx.  Rob didnt know why pt was given this, but he knows he will be unable to get it changed through the hospital doctor. Initial call taken by: Lowella Petties CMA,  May 15, 2010 4:03 PM  Follow-up for Phone Call        change to:  Amox 875 mg, 1 by mouth two times a day, #20 Zpak, #1, use as directed.   f/u with [facility] needed to recheck lungs Hannah Beat MD  May 15, 2010 4:09 PM   Additional Follow-up for Phone Call Additional follow up Details #1::        Meds called to [facility], emr updated. Additional Follow-up by: Lowella Petties CMA,  May 15, 2010 4:24 PM    New/Updated Medications: ZITHROMAX 1 GM PACK (AZITHROMYCIN) take by mouth daily as directed AMOXICILLIN 875 MG TABS (AMOXICILLIN) take one by mouth twice a day x 10 days  Prior Medications: ASPIR-LOW 81 MG TBEC (ASPIRIN) one a day NITROSTAT 0.3 MG SUBL (NITROGLYCERIN) 1 as needed LASIX 40 MG TABS (FUROSEMIDE) 1 by mouth daily as needed for weight gain/swelling. POTASSIUM CHLORIDE  GRAN (POTASSIUM CHLORIDE) 1 by mouth daily as needed with lasix DONEPEZIL HCL 10 MG TABS (DONEPEZIL HCL) 1 tab by mouth daily HYDROCODONE-ACETAMINOPHEN 5-500 MG TABS (HYDROCODONE-ACETAMINOPHEN) 1 tab by mouth every 6 hour for pain Current Allergies: No known allergies

## 2010-10-03 NOTE — Miscellaneous (Signed)
Summary: FL2 form/[facility]  FL2 form/[facility]   Imported By: Sherian Rein 05/26/2010 08:26:13  _____________________________________________________________________  External Attachment:    Type:   Image     Comment:   External Document

## 2010-10-03 NOTE — Progress Notes (Signed)
----   Converted from flag ---- ---- 02/28/2010 5:37 PM, [name] MD wrote: CMET, lipids, cbcDx 272.0, 401.1  ---- 02/28/2010 1:05 PM, Liane Comber CMA (AAMA) wrote: Pt is scheduled for cpx labs tomorrow, what labs to draw and dx codes? Thanks Tasha ------------------------------

## 2010-10-03 NOTE — Progress Notes (Signed)
Summary: wants something for sleep  Phone Note Call from Patient   Caller: Daughter  Mariea Clonts  [phone] Summary of Call: Pt is having problems sleeping- cant fall asleep, and daughter is asking if he can have something for that.  He was recently given voltaren for shoulder pain and you had mentioned that you didnt want to over  medicate him.  Please advise, [facility]. Initial call taken by: Lowella Petties CMA,  February 24, 2010 12:49 PM  Follow-up for Phone Call        trazadone 50 mg by mouth at bedtime, #15, 0 refills  will defer long term management, but short term trazadone is benign Follow-up by: Hannah Beat MD,  February 24, 2010 12:52 PM  Additional Follow-up for Phone Call Additional follow up Details #1::        rx called to pharmacy and family notified Additional Follow-up by: Benny Lennert CMA Duncan Dull),  February 24, 2010 12:56 PM     Appended Document: wants something for sleep Advised pt's daughter.

## 2010-10-03 NOTE — Miscellaneous (Signed)
  Clinical Lists Changes  Medications: Removed medication of HYDROCODONE-ACETAMINOPHEN 5-500 MG TABS (HYDROCODONE-ACETAMINOPHEN) 1 tab by mouth every 6 hour for pain Removed medication of ZITHROMAX 1 GM PACK (AZITHROMYCIN) take by mouth daily as directed Removed medication of AMOXICILLIN 875 MG TABS (AMOXICILLIN) take one by mouth twice a day x 10 days Added new medication of OXYCODONE HCL 5 MG TABS (OXYCODONE HCL) take one tablet by mouth every 4 hours Added new medication of DICLOFENAC SODIUM 75 MG TBEC (DICLOFENAC SODIUM) one tablet by mouth 2 times daily Added new medication of IMDUR 30 MG XR24H-TAB (ISOSORBIDE MONONITRATE) one tablet by mouth daily Added new medication of PRILOSEC 20 MG CPDR (OMEPRAZOLE) two capsules by mouth daily Added new medication of COLACE 100 MG CAPS (DOCUSATE SODIUM) one tablet 2 times daily while on narcotics Added new medication of TRAMADOL HCL 50 MG TABS (TRAMADOL HCL) take two tablets twice daily     Prior Medications: ASPIR-LOW 81 MG TBEC (ASPIRIN) one a day NITROSTAT 0.3 MG SUBL (NITROGLYCERIN) 1 as needed LASIX 40 MG TABS (FUROSEMIDE) 1 by mouth daily as needed for weight gain/swelling. POTASSIUM CHLORIDE  GRAN (POTASSIUM CHLORIDE) 1 by mouth daily as needed with lasix DONEPEZIL HCL 10 MG TABS (DONEPEZIL HCL) 1 tab by mouth daily Current Allergies: No known allergies

## 2010-10-03 NOTE — Progress Notes (Signed)
Summary: regarding charges  Phone Note Call from Patient Call back at [phone]   Caller: Daughter [name] Elliott Summary of Call: Per [name] Elliott,  [name] Elliott 03-08-2008 can not be billed as a cpx. Called daughter to discuss, daugther was very upset.[name]  May 11, 2010 4:11 PM   Pt was seen on 7/6 for physical, which his insurance will pay for, but it was billed as a routine office visit- which insurance wont pay for, and now pt is being billed.  Daughter is asking if this can be changed. Initial call taken by: Lowella Petties CMA,  May 01, 2010 9:08 AM  Follow-up for Phone Call        Can someone look into this for me.Marland KitchenMarland KitchenI don't bill physicals becasue we cannot do these after age..is his case different? Follow-up by: [name] MD,  May 01, 2010 3:49 PM  Additional Follow-up for Phone Call Additional follow up Details #1::        Called pts daughter, left message.[name]  May 11, 2010 12:19 PM  Additional Follow-up by: [name],  May 11, 2010 12:19 PM    Additional Follow-up for Phone Call Additional follow up Details #2::    Called daughter, says pt will be upset, says he wanted a cpx on this visit-03-08-2010.[name]  May 11, 2010 4:10 PM  Follow-up by: [name],  May 11, 2010 4:10 PM

## 2010-10-03 NOTE — Letter (Signed)
Summary: Records Dated 09-18-05 thru 07-04-09/[facility]  Records Dated 09-18-05 thru 07-04-09/[facility]   Imported By: Lanelle Bal 04/11/2010 10:32:59  _____________________________________________________________________  External Attachment:    Type:   Image     Comment:   External Document

## 2010-10-03 NOTE — Progress Notes (Signed)
Summary: needs hospital f/u by friday  Phone Note Call from Patient Call back at [phone]   Caller: Patient Call For: [name] MD Summary of Call: [facility] wants patient to follow up with you by friday. He is coming home today.  Is there any where I can add him on.  Initial call taken by: Melody Comas,  February 07, 2010 2:58 PM  Follow-up for Phone Call        Add on sometime on Friday...if no 30 min slot, just add on at 3 PM. Follow-up by: [name] MD,  February 07, 2010 3:06 PM  Additional Follow-up for Phone Call Additional follow up Details #1::        I left a message on daughter's voice mail to call back and schedule appt. on Friday,June 10th @ 3:00. Additional Follow-up by: Beau Fanny,  February 07, 2010 3:18 PM

## 2010-10-03 NOTE — Assessment & Plan Note (Signed)
Summary: CPX/DLO   Vital Signs:  Patient profile:   77 year old male Height:      62 inches Weight:      165.0 pounds BMI:     30.29 Temp:     97.7 degrees F oral Pulse rate:   70 / minute Pulse rhythm:   regular BP sitting:   118 / 70  (left arm) Cuff size:   regular  Vitals Entered By: Benny Lennert CMA Duncan Dull) (March 08, 2010 11:33 AM)  History of Present Illness: Chief complaint cpx  Recent hospitalization 6/5-6/6 for weakness and confusion...unclear cause, resolved on its own.  MRI: showed atrophy, chronic microvascular ischemia.Marland Kitchenno clear  stroke.  CXR neg, Carotid doplers neg. Hg nml, platelets lowl,TSH nml, CE neg..  Thrombocytopenia...cbc plt nml 6/29...resolved.   Pt and graddaughter report..sudden leg weakness and confusion. Since out of hopsital..doing well except some intermittant neck pain.   Family has been noticing some baseline dementia .. gradually worsening. Last OV started on aricept.  Daughter took me aside and noted pt confusion is worse than he lets on.  April labs and peripheral dopplers were normal in 12/2008 looking into episodic leg weakness and dizzyness.   Right 5th digit..meloxicam remains red and stiff, contracture present.Notes pain from finger to arm.   Cervical spine films showed significant cervical changes...arthritis Recommended meloxicam... helping minimally.  using tramadol for pain two times a day..helps some.  CAD, Dr.  Melburn Popper..taken off diliazem..due to lower BP and pulse. BP improved of med..less dizziness now.    Insomnia, trazodone given for sleep. Minimal benefit.   Problems Prior to Update: 1)  Finger Pain  (ICD-729.5) 2)  Unspecified Thrombocytopenia  (ICD-287.5) 3)  Neck Pain, Acute  (ICD-723.1) 4)  Thrombocytopenia  (ICD-287.5) 5)  Dementia, Mild  (ICD-294.8) 6)  Vitamin B12 Deficiency  (ICD-266.2) 7)  Unspecified Hypothyroidism  (ICD-244.9) 8)  Calf Pain, Bilateral  (ICD-729.5) 9)  Paresthesia  (ICD-782.0) 10)   Dizziness  (ICD-780.4) 11)  Gerd  (ICD-530.81) 12)  Leg Pain, Bilateral  (ICD-729.5) 13)  Cad  (ICD-414.00) 14)  Hypercholesterolemia  (ICD-272.0) 15)  Hypertension  (ICD-401.9) 16)  Arthritis  (ICD-716.90)  Current Medications (verified): 1)  Isosorbide Dinitrate 30 Mg Tabs (Isosorbide Dinitrate) .... Once Daily 2)  Aspir-Low 81 Mg Tbec (Aspirin) .... One A Day 3)  Cardizem Cd 240 Mg Xr24h-Cap (Diltiazem Hcl Coated Beads) .... 1/2 Tablet Daily 4)  Nitrostat 0.3 Mg Subl (Nitroglycerin) .Marland Kitchen.. 1 As Needed 5)  Lasix 40 Mg Tabs (Furosemide) .Marland Kitchen.. 1 Daily 6)  Potassium Chloride  Gran (Potassium Chloride) .Marland Kitchen.. 1 Daily 7)  Omeprazole 20 Mg Tbec (Omeprazole) .... 2 Tabs By Mouth Daily 8)  Pravastatin Sodium 10 Mg Tabs (Pravastatin Sodium) .Marland Kitchen.. 1 Tab By Mouth Daily 9)  Donepezil Hcl 10 Mg Tabs (Donepezil Hcl) .Marland Kitchen.. 1 Tab By Mouth Daily 10)  Diclofenac Sodium 75 Mg Tbec (Diclofenac Sodium) .... Take One Tablet Two Times Daily 11)  Tramadol Hcl 50 Mg Tabs (Tramadol Hcl) .... 2 Tab By Mouth Two Times A Day 12)  Lunesta 3 Mg Tabs (Eszopiclone) .Marland Kitchen.. 1 Tab By Mouth Qhs  Allergies (verified): No Known Drug Allergies  Past History:  Past medical, surgical, family and social histories (including risk factors) reviewed, and no changes noted (except as noted below).  Past Medical History: Reviewed history from 08/12/2009 and no changes required. Current Problems:  HYPERCHOLESTEROLEMIA (ICD-272.0) HYPERTENSION (ICD-401.9) ARTHRITIS (ICD-716.90), shoulders    Past Surgical History: Reviewed history from 08/12/2009 and no changes  required. cardiac cath  ~2007 2010 2 surgery right hand crush injury  right 5th digit contracture, limtied motion lumbar back surgery 2007 : Dr. Jarold Motto (neurosurgeon) knne surgery age 70  Family History: Reviewed history from 08/12/2009 and no changes required.  brother: lung cancer mother: breast cancer  Social History: Reviewed history from 08/12/2009 and no  changes required. Occupation: Widow/Widower Never Smoked Alcohol use-no Drug use-no Regular exercise-no  Review of Systems General:  Complains of fatigue; denies fever. CV:  Denies chest pain or discomfort. Resp:  Denies shortness of breath. GI:  Denies abdominal pain. GU:  Denies dysuria.  Physical Exam  General:  elderly male in NAD Eyes:  No corneal or conjunctival inflammation noted. EOMI. Perrla. Funduscopic exam benign, without hemorrhages, exudates or papilledema. Vision grossly normal. Ears:  External ear exam shows no significant lesions or deformities.  Otoscopic examination reveals clear canals, tympanic membranes are intact bilaterally without bulging, retraction, inflammation or discharge. Hearing is grossly normal bilaterally. Nose:  External nasal examination shows no deformity or inflammation. Nasal mucosa are pink and moist without lesions or exudates. Mouth:  MMM Neck:  no carotid bruit or thyromegaly no cervical or supraclavicular lymphadenopathy  Lungs:  Normal respiratory effort, chest expands symmetrically. Lungs are clear to auscultation, no crackles or wheezes. Heart:  Normal rate and regular rhythm. S1 and S2 normal without gallop, murmur, click, rub or other extra sounds. Abdomen:  Bowel sounds positive,abdomen soft and non-tender without masses, organomegaly or hernias noted. Msk:  TTP over central cervical spine and B paraspinous muscles Neg Spurling's  significant decrease ROM in neck, no vertebral ttp scarring , redness and swelling in right 5th digit diffusely.  Pulses:  R and L posterior tibial pulses are full and equal bilaterally  Extremities:  1 plus edema B, B varicosities   Impression & Recommendations:  Problem # 1:  NECK PAIN, ACUTE (ICD-723.1) He has severe degenerative changes in cervical spine.Marland KitchenMarland KitchenHe states no benifit with any antiinflammatory, but also confused about whether tried it.  He refuses referral to PMR for injections.  Will try  tramadol for pain.   His updated medication list for this problem includes:    Aspir-low 81 Mg Tbec (Aspirin) ..... One a day    Diclofenac Sodium 75 Mg Tbec (Diclofenac sodium) .Marland Kitchen... Take one tablet two times daily    Tramadol Hcl 50 Mg Tabs (Tramadol hcl) .Marland Kitchen... 2 tab by mouth two times a day  Problem # 2:  FINGER PAIN (ICD-729.5) not clearly radicular symtpoms. More liekly chronic pain from scarring and past finger surgeries.  Treatment is primarily pain management.   Problem # 3:  DEMENTIA, MILD (ICD-294.8) Continue aricept.   Problem # 4:  THROMBOCYTOPENIA (ICD-287.5) Resolved.   Problem # 5:  INSOMNIA, CHRONIC (ICD-307.42) Minimal improvement with trazodone. Will try lunesta.   Problem # 6:  LEG PAIN, BILATERAL (ICD-729.5) Likely due to varicosities. Reocmmend compression hose, elevation.  No sign of PVD on ABIs.  Problem # 7:  UNSPECIFIED HYPOTHYROIDISM (ICD-244.9)  Labs Reviewed: TSH: 5.078 (12/30/2009)    Chol: 127 (03/01/2010)   HDL: 41.70 (03/01/2010)   LDL: 73 (03/01/2010)   TG: 60.0 (03/01/2010)  Complete Medication List: 1)  Isosorbide Dinitrate 30 Mg Tabs (Isosorbide dinitrate) .... Once daily 2)  Aspir-low 81 Mg Tbec (Aspirin) .... One a day 3)  Cardizem Cd 240 Mg Xr24h-cap (Diltiazem hcl coated beads) .... 1/2 tablet daily 4)  Nitrostat 0.3 Mg Subl (Nitroglycerin) .Marland Kitchen.. 1 as needed 5)  Lasix 40 Mg  Tabs (Furosemide) .Marland Kitchen.. 1 daily 6)  Potassium Chloride Gran (Potassium chloride) .Marland Kitchen.. 1 daily 7)  Omeprazole 20 Mg Tbec (Omeprazole) .... 2 tabs by mouth daily 8)  Pravastatin Sodium 10 Mg Tabs (Pravastatin sodium) .Marland Kitchen.. 1 tab by mouth daily 9)  Donepezil Hcl 10 Mg Tabs (Donepezil hcl) .Marland Kitchen.. 1 tab by mouth daily 10)  Diclofenac Sodium 75 Mg Tbec (Diclofenac sodium) .... Take one tablet two times daily 11)  Tramadol Hcl 50 Mg Tabs (Tramadol hcl) .... 2 tab by mouth two times a day 12)  Lunesta 3 Mg Tabs (Eszopiclone) .Marland Kitchen.. 1 tab by mouth qhs  Patient Instructions: 1)   Follow up appt in 1 month 30 min OV. 2)  Continue tramadol for pain and start lunesta for sleep. Prescriptions: LUNESTA 3 MG TABS (ESZOPICLONE) 1 tab by mouth qhs  #30 x 3   Entered and Authorized by:   [name] MD   Signed by:   [name] MD on 03/08/2010   Method used:   Print then Give to Patient   RxID:   [id number] TRAMADOL HCL 50 MG TABS (TRAMADOL HCL) 2 tab by mouth two times a day  #120 x 0   Entered and Authorized by:   [name] MD   Signed by:   [name] MD on 03/08/2010   Method used:   Print then Give to Patient   RxID:   [id number]   Current Allergies (reviewed today): No known allergies

## 2010-10-05 NOTE — Assessment & Plan Note (Signed)
Summary: NEW / [facility] / # / CD   Vital Signs:  Patient profile:   77 year old male Height:      62 inches Weight:      151 pounds BMI:     27.72 O2 Sat:      99 % on Room air Temp:     97.4 degrees F oral Pulse rate:   72 / minute BP sitting:   128 / 78  (left arm) Cuff size:   regular  Vitals Entered By: Bill Salinas CMA (August 29, 2010 2:45 PM)  O2 Flow:  Room air CC: new pt here to est care with primary   Primary Care Provider:  [name] MD  CC:  new pt here to est care with primary.  History of Present Illness: Patient presents to establish for on-going continuity care.   His  chief complaint is that he gets to feeling sick all over. This will occur after dinner and last for several hours. He indicates an abdominal location for his discomfort, a sick like feeling. He has reports that he doesn't have much of an appetite. Eating does not seem to make his symptoms any worse. He has GERD and had chronic constipation. He has tried miralax as well as other products. He has taken epson salts in coffee which "cleaned him out."   He reports that he has trouble sleeping. He denies daytime naps. He has latency trouble up to  2 hrs . He has not had relief with benadryl, tylenol PM. He also has duration problems with a usual span of 3-4 hours. He does have nocturia q 2 hrs.  He has a broken left shoulder after a fall September 9th and has failed conservative treatment and he is scheduled for surgical reconstruction Jan 17th. He also had thoracic vertebral fracture.  For the chornic pain he takes oxycodone. Revied eChart radiology reports: CT chest revealed a T11 compression fracture of indeterminate age.   Memory seems to be getting worse per the daughter who is present today.   Patient with two recent hospitalizations: Sept after fall and October for PNA.   Preventive Screening-Counseling & Management  Alcohol-Tobacco     Alcohol drinks/day: <1     Smoking Status:  never  Caffeine-Diet-Exercise     Caffeine use/day: 2 cups per day     Does Patient Exercise: no  Hep-HIV-STD-Contraception     Dental Visit-last 6 months no     Sun Exposure-Excessive: no  Safety-Violence-Falls     Seat Belt Use: yes     Helmet Use: n/a     Firearms in the Home: firearms in the home     Smoke Detectors: no     Violence in the Home: no risk noted     Sexual Abuse: no     Fall Risk: slight fall risk      Blood Transfusions:  no.    Current Medications (verified): 1)  Aspir-Low 81 Mg Tbec (Aspirin) .... One A Day 2)  Nitrostat 0.3 Mg Subl (Nitroglycerin) .Marland Kitchen.. 1 As Needed 3)  Lasix 40 Mg Tabs (Furosemide) .Marland Kitchen.. 1 By Mouth Daily As Needed For Weight Gain/swelling. 4)  Potassium Chloride  Gran (Potassium Chloride) .Marland Kitchen.. 1 By Mouth Daily As Needed With Lasix 5)  Donepezil Hcl 10 Mg Tabs (Donepezil Hcl) .Marland Kitchen.. 1 Tab By Mouth Daily 6)  Oxycodone Hcl 5 Mg Tabs (Oxycodone Hcl) .... Take One Tablet By Mouth Every 4 Hours 7)  Diclofenac Sodium 75 Mg Tbec (  Diclofenac Sodium) .... One Tablet By Mouth 2 Times Daily 8)  Imdur 30 Mg Xr24h-Tab (Isosorbide Mononitrate) .... One Tablet By Mouth Daily 9)  Prilosec 20 Mg Cpdr (Omeprazole) .... Two Capsules By Mouth Daily 10)  Colace 100 Mg Caps (Docusate Sodium) .... One Tablet 2 Times Daily While On Narcotics 11)  Gabapentin 300 Mg Caps (Gabapentin) .Marland Kitchen.. 1 By Mouth At Bedtime 12)  Miralax  Powd (Polyethylene Glycol 3350) .Marland Kitchen.. 17gm in 8oz Fluid Daily For Constipation As Needed 13)  Lactulose 10 Gm/55ml Soln (Lactulose) .... 30 Ml By Mouth Daily To Prn  For Constipation  Allergies (verified): No Known Drug Allergies  Past History:  Past Medical History: CONSTIPATION, CHRONIC (ICD-564.09) SEBACEOUS CYST, INFECTED (ICD-706.2) MALAISE AND FATIGUE (ICD-780.79) SHOULDER PAIN (ICD-719.41) INSOMNIA, CHRONIC (ICD-307.42) FINGER PAIN (ICD-729.5) NECK PAIN, ACUTE (ICD-723.1) THROMBOCYTOPENIA (ICD-287.5) DEMENTIA, MILD  (ICD-294.8) VITAMIN B12 DEFICIENCY (ICD-266.2) UNSPECIFIED HYPOTHYROIDISM (ICD-244.9) CALF PAIN, BILATERAL (ICD-729.5) GERD (ICD-530.81) LEG PAIN, BILATERAL (ICD-729.5) CAD (ICD-414.00) HYPERCHOLESTEROLEMIA (ICD-272.0) HYPERTENSION (ICD-401.9) ARTHRITIS (ICD-716.90)  Physician roster                    Cardiology - [facility]                    ortho - [name] Dr. Mina Marble for hand surgery.                       Past Surgical History: cardiac cath  ~2007 2010 2 surgery right hand crush injury  right 5th digit contracture, limtied motion lumbar back surgery 2007 : Dr. Jarold Motto (neurosurgeon) knne surgery age 35 cataract surgery '09 Right rotator cuff surgery Aug 30th, 2011  Family History: Father - deceased @ 64:a brigthts disease Mother - decease @ 92's: had breast cancer.  brother: lung cancer Dtr - DM Neg -colon cancer  Social History: 2nd grade education Married  - '58 -'95 2 dtrs - ' 59, '64; youngest daughter died septic kidney work: Restaurant manager, fast food. Lives - alone, handles all his ADLs  Never Smoked Alcohol use-no Drug use-no Regular exercise-no Caffeine use/day:  2 cups per day Dental Care w/in 6 mos.:  no Sun Exposure-Excessive:  no Seat Belt Use:  yes Fall Risk:  slight fall risk Blood Transfusions:  no  Review of Systems       The patient complains of weight loss, abdominal pain, and severe indigestion/heartburn.  The patient denies anorexia, fever, weight gain, vision loss, decreased hearing, hoarseness, chest pain, dyspnea on exertion, peripheral edema, prolonged cough, muscle weakness, difficulty walking, depression, unusual weight change, enlarged lymph nodes, and breast masses.    Physical Exam  General:  elderly white male in no actue distress. Slight frame Head:  normocephalic, atraumatic, and no abnormalities observed.   Eyes:  vision grossly intact, pupils equal, and pupils round. Impercepitble IOL after cataract extraction.  C&DS clear   Mouth:  edentualous wiht dentures -poor fit Neck:  supple and full ROM.   Chest Wall:  no deformities and no tenderness.   Lungs:  normal respiratory effort, normal breath sounds, no crackles, and no wheezes.   Heart:  normal rate, regular rhythm, no gallop, and no JVD.   Abdomen:  soft, normal bowel sounds, no distention, no masses, no rigidity, and no inguinal hernia. Tneder to deep palpation in the RLQ  Msk:  very tender left shoulder with very limited range of motion.. Will not straighten elbow left due to pain. Left 5th digit with deformity at  DIP.  Pulses:  2+ radial pulses Extremities:  No clubbing, cyanosis, edema, or deformity noted with normal full range of motion of all joints.   Neurologic:  oriented to person, good long term recall. nl gait.  Skin:  turgor normal and color normal.     Impression & Recommendations:  Problem # 1:  CONSTIPATION, CHRONIC (ICD-564.09) Patients general sense of feeling ill with a focus of the abdomen may be due to a combination of   constipation and inadequately controlled GERD.  Plan - continue omeprazole 40mg  once daily (new Rx provided           add Ranitidine 150 mg q PM           trial of MOM 30cc two times a day for constipation.   The following medications were removed from the medication list:    Miralax Powd (Polyethylene glycol 3350) .Marland KitchenMarland KitchenMarland KitchenMarland Kitchen 17gm in 8oz fluid daily for constipation as needed    Lactulose 10 Gm/49ml Soln (Lactulose) .Marland KitchenMarland KitchenMarland KitchenMarland Kitchen 30 ml by mouth daily to prn  for constipation His updated medication list for this problem includes:    Colace 100 Mg Caps (Docusate sodium) ..... One tablet 2 times daily while on narcotics  Problem # 2:  SHOULDER PAIN (ICD-719.41) Source of pain is progressive change, non-healing of fracture.   Plan - for surgery January 17th           continue oxycodone and NSAIDs  His updated medication list for this problem includes:    Aspir-low 81 Mg Tbec (Aspirin) ..... One a day    Oxycodone Hcl 5 Mg Tabs  (Oxycodone hcl) .Marland Kitchen... Take one tablet by mouth every 4 hours    Diclofenac Sodium 75 Mg Tbec (Diclofenac sodium) ..... One tablet by mouth 2 times daily  Problem # 3:  INSOMNIA, CHRONIC (ICD-307.42) Both latency and duration insomnia. Contributing factors are shoulder pain and nocturia x 3-4.  Plan - continue pain meds for shoulder           treat nocturia - Jalyn, if this reduces nocturia will Rx tamsulosin           trial of tempazepam 30mg  at bedtime.   Problem # 4:  FINGER PAIN (ICD-729.5) chronic problem. He has an appointment with Dr. Mina Marble for possible redo repair of old fracture.   Problem # 5:  DEMENTIA, MILD (ICD-294.8) Progressive change in memory and mental status may be due, in part, to pain medications, sleep deprivation.  Plan - continue aricept           if other problems stabilize and he continues to decline will add Namenda.  Problem # 6:  HYPERTENSION (ICD-401.9)  His updated medication list for this problem includes:    Lasix 40 Mg Tabs (Furosemide) .Marland Kitchen... 1 by mouth daily as needed for weight gain/swelling.  BP today: 128/78 Prior BP: 120/84 (08/02/2010)  Labs Reviewed: K+: 3.7 (07/20/2010) Creat: : 0.9 (07/20/2010)   Chol: 127 (03/01/2010)   HDL: 41.70 (03/01/2010)   LDL: 73 (03/01/2010)   TG: 60.0 (03/01/2010)  adequate control on present medications. Continue the same.   Complete Medication List: 1)  Aspir-low 81 Mg Tbec (Aspirin) .... One a day 2)  Nitrostat 0.3 Mg Subl (Nitroglycerin) .Marland Kitchen.. 1 as needed 3)  Lasix 40 Mg Tabs (Furosemide) .Marland Kitchen.. 1 by mouth daily as needed for weight gain/swelling. 4)  Potassium Chloride Gran (Potassium chloride) .Marland Kitchen.. 1 by mouth daily as needed with lasix 5)  Donepezil Hcl 10 Mg Tabs (Donepezil  hcl) .... 1 tab by mouth daily 6)  Oxycodone Hcl 5 Mg Tabs (Oxycodone hcl) .... Take one tablet by mouth every 4 hours 7)  Diclofenac Sodium 75 Mg Tbec (Diclofenac sodium) .... One tablet by mouth 2 times daily 8)  Imdur 30 Mg  Xr24h-tab (Isosorbide mononitrate) .... One tablet by mouth daily 9)  Prilosec 20 Mg Cpdr (Omeprazole) .... Two capsules by mouth daily 10)  Colace 100 Mg Caps (Docusate sodium) .... One tablet 2 times daily while on narcotics 11)  Gabapentin 300 Mg Caps (Gabapentin) .Marland Kitchen.. 1 by mouth at bedtime 12)  Temazepam 15 Mg Caps (Temazepam) .Marland Kitchen.. 1 by mouth at bedtime for sleep 13)  Jalyn 0.5-0.4 Mg Caps (Dutasteride-tamsulosin hcl) .Marland Kitchen.. 1 by mouth qhs  Patient Instructions: 1)  general feeling bad - seems to be in the region of the abdomen and acid indigestion and contstipation can be a cause. Plan continue the prilosec -will give a Rx for omeprazole 40mg  once a day. Take ranitidine 150 mg before supper. Take milk of magnesia 30cc, a full cap, AM and evening for constipation. 2)  Urinary frequency and nighttime urination - try Jalyn once a day at bedtime to reduce the urinary frequency. If this helps call for a prescription. If it does not help - call and we will try another medication. 3)  Sleep - between the shoulder pain and urination this will be hard to fix. Plan - a trial of tempazepam 15 mg at bedtime. 4)  Pain - surgery will hopefully help. No change in medication 5)  Memory - can be affected by pain, pain medication or progression of problem. Will consider adding a second medication after these other issues are settled out.  Prescriptions: TEMAZEPAM 15 MG CAPS (TEMAZEPAM) 1 by mouth at bedtime for sleep  #30 x 5   Entered and Authorized by:   Jacques Navy MD   Signed by:   Jacques Navy MD on 08/29/2010   Method used:   Handwritten   RxID:   [id number]    Orders Added: 1)  New Patient Level IV [14782]

## 2010-10-05 NOTE — Assessment & Plan Note (Signed)
Summary: follow up/alc   Vital Signs:  Patient profile:   77 year old male Height:      62 inches Weight:      154.0 pounds BMI:     28.27 Temp:     98.3 degrees F oral Pulse rate:   60 / minute Pulse rhythm:   regular BP sitting:   120 / 84  (left arm) Cuff size:   regular  Vitals Entered By: Benny Lennert CMA Duncan Dull) (August 02, 2010 10:57 AM)  History of Present Illness: Chief complaint follow up and form for surgery  Sept 9 to 12.. fell...pneumonia Admitted to [facility]  Readmission 10/4 DISCHARGE DIAGNOSES: 1. Fever of unknown origin, resolved... possible PNA.Marland Kitchen given antibiotics. 2. Dehydration, resolved. 3. Hyponatremia, resolved. 4. Hyperkalemia, resolved. 5. Leukocytosis likely secondary to problem #1, resolved. Discharged to [employer] .. home on 10/21  See by Ephraim Hamburger  for malaise.. Labs showed nml TSH, CMEt, Cbc EKG read by Dr. Reece Agar: sinus with PVCs and ventricular trigeminy.  ?slightly prolonged QT.  lateral lead strain somewhat more pronounced than last EKG 25-Dec-2009  Saw cardiologist last week.. repeated EKG..placced on 24 hour monitor..no results back yet   Increased potassium to 20 mg daily.  Cardiologist is Dr. Elease Hashimoto.  Pt and daughter say he has 2 known blockages in heart vessels, medically managing for now unless starts becoming symptomatic from cardiac standpoint.  reviewed latest cards note - diffuse small vessel disease, treating medically.   Has upcoming right shoulder surgery with ORTHO Dr. Wendie Agreste .. per daugher forms given to cards as well for clearance. MRI of shoulder shoulder showed marked deterioration of joint...needs artial replacement. Biggest complaint right now is right shoulder pain.  Started in NH on gabapentin for foot pain.. helped relieve it.  In last few days cyst on back red, tender, scab present, possible discharge.   Problems Prior to Update: 1)  Malaise and Fatigue  (ICD-780.79) 2)  Shoulder Pain  (ICD-719.41) 3)   Insomnia, Chronic  (ICD-307.42) 4)  Finger Pain  (ICD-729.5) 5)  Neck Pain, Acute  (ICD-723.1) 6)  Thrombocytopenia  (ICD-287.5) 7)  Dementia, Mild  (ICD-294.8) 8)  Vitamin B12 Deficiency  (ICD-266.2) 9)  Unspecified Hypothyroidism  (ICD-244.9) 10)  Calf Pain, Bilateral  (ICD-729.5) 11)  Gerd  (ICD-530.81) 12)  Leg Pain, Bilateral  (ICD-729.5) 13)  Cad  (ICD-414.00) 14)  Hypercholesterolemia  (ICD-272.0) 15)  Hypertension  (ICD-401.9) 16)  Arthritis  (ICD-716.90)  Current Medications (verified): 1)  Aspir-Low 81 Mg Tbec (Aspirin) .... One A Day 2)  Nitrostat 0.3 Mg Subl (Nitroglycerin) .Marland Kitchen.. 1 As Needed 3)  Lasix 40 Mg Tabs (Furosemide) .Marland Kitchen.. 1 By Mouth Daily As Needed For Weight Gain/swelling. 4)  Potassium Chloride  Gran (Potassium Chloride) .Marland Kitchen.. 1 By Mouth Daily As Needed With Lasix 5)  Donepezil Hcl 10 Mg Tabs (Donepezil Hcl) .Marland Kitchen.. 1 Tab By Mouth Daily 6)  Oxycodone Hcl 5 Mg Tabs (Oxycodone Hcl) .... Take One Tablet By Mouth Every 4 Hours 7)  Diclofenac Sodium 75 Mg Tbec (Diclofenac Sodium) .... One Tablet By Mouth 2 Times Daily 8)  Imdur 30 Mg Xr24h-Tab (Isosorbide Mononitrate) .... One Tablet By Mouth Daily 9)  Prilosec 20 Mg Cpdr (Omeprazole) .... Two Capsules By Mouth Daily 10)  Colace 100 Mg Caps (Docusate Sodium) .... One Tablet 2 Times Daily While On Narcotics 11)  Gabapentin 300 Mg Caps (Gabapentin) .Marland Kitchen.. 1 By Mouth At Bedtime 12)  Miralax  Powd (Polyethylene Glycol 3350) .Marland Kitchen.. 17gm in [employer]  Fluid Daily For Constipation As Needed  Allergies (verified): No Known Drug Allergies  Past History:  Past medical, surgical, family and social histories (including risk factors) reviewed, and no changes noted (except as noted below).  Past Medical History: Reviewed history from 08/12/2009 and no changes required. Current Problems:  HYPERCHOLESTEROLEMIA (ICD-272.0) HYPERTENSION (ICD-401.9) ARTHRITIS (ICD-716.90), shoulders    Past Surgical History: Reviewed history from  08/12/2009 and no changes required. cardiac cath  ~2007 2010 2 surgery right hand crush injury  right 5th digit contracture, limtied motion lumbar back surgery 2007 : Dr. Jarold Motto (neurosurgeon) knne surgery age 20  Family History: Reviewed history from 08/12/2009 and no changes required.  brother: lung cancer mother: breast cancer  Social History: Reviewed history from 08/12/2009 and no changes required. Occupation: Widow/Widower Never Smoked Alcohol use-no Drug use-no Regular exercise-no  Review of Systems General:  Denies fatigue and fever; Weakness improved currently.  Feels flushed aftereating sometimes. Still some insomnia..per pt Benadryl did not help. . CV:  Denies chest pain or discomfort. Resp:  Denies shortness of breath. GI:  Denies abdominal pain. GU:  Denies dysuria.  Physical Exam  General:  elderly male in NAD Mouth:  MMM Neck:  no carotid bruit or thyromegaly no cervical or supraclavicular lymphadenopathy  Lungs:  Normal respiratory effort, chest expands symmetrically. Lungs are clear to auscultation, no crackles or wheezes. Heart:  irregular.  no murmur appreciated. Abdomen:  Bowel sounds positive,abdomen soft and non-tender without masses, organomegaly or hernias noted. Pulses:  R and L posterior tibial pulses are full and equal bilaterally  Extremities:  1 plus edema B, B varicosities Neurologic:  able to get on exam table without assistance, although slow.  CN grossly intact.  station slow but steady.  no focal weakness noted.  A&O Skin:  small pea sizenodule central back with  postinflammatory changes...light purple hue Psych:  Oriented X3, normally interactive, good eye contact, not anxious appearing, not depressed appearing, but some  memory impairment.     Impression & Recommendations:  Problem # 1:  MALAISE AND FATIGUE (ICD-780.79) Improved.  Lab eval wnl.  Problem # 2:  SHOULDER PAIN (ICD-719.41) Cleared medically for surgery but need  clearance from cardiologist prior to surgery.  His updated medication list for this problem includes:    Aspir-low 81 Mg Tbec (Aspirin) ..... One a day    Oxycodone Hcl 5 Mg Tabs (Oxycodone hcl) .Marland Kitchen... Take one tablet by mouth every 4 hours    Diclofenac Sodium 75 Mg Tbec (Diclofenac sodium) ..... One tablet by mouth 2 times daily  Problem # 3:  SEBACEOUS CYST, INFECTED (ICD-706.2) Post inflammatory changes, no fluctuation, already drained per pt...suggesting healing sebaceous cyst. Warm compresses, topical antibiotic. No oral antibitoic indicated.   Problem # 4:  THROMBOCYTOPENIA (ICD-287.5) Stable last check.   Problem # 5:  UNSPECIFIED HYPOTHYROIDISM (ICD-244.9) Well controlled.  Labs Reviewed: TSH: 4.37 (07/20/2010)    Chol: 127 (03/01/2010)   HDL: 41.70 (03/01/2010)   LDL: 73 (03/01/2010)   TG: 60.0 (03/01/2010)  Problem # 6:  INSOMNIA, CHRONIC (ICD-307.42) Try 3 mg melatoin over the counter for sleep.  No napping.  Try to walk as able.  Problem # 7:  CONSTIPATION, CHRONIC (ICD-564.09) Not compliant with recs to increase fiber and water. Mirilax not effective. Trial of lactulose.  His updated medication list for this problem includes:    Colace 100 Mg Caps (Docusate sodium) ..... One tablet 2 times daily while on narcotics    Miralax Powd (Polyethylene glycol 3350) .Marland KitchenMarland KitchenMarland KitchenMarland Kitchen  17gm in 8oz fluid daily for constipation as needed    Lactulose 10 Gm/79ml Soln (Lactulose) .Marland KitchenMarland KitchenMarland KitchenMarland Kitchen 30 ml by mouth daily to prn  for constipation  Complete Medication List: 1)  Aspir-low 81 Mg Tbec (Aspirin) .... One a day 2)  Nitrostat 0.3 Mg Subl (Nitroglycerin) .Marland Kitchen.. 1 as needed 3)  Lasix 40 Mg Tabs (Furosemide) .Marland Kitchen.. 1 by mouth daily as needed for weight gain/swelling. 4)  Potassium Chloride Gran (Potassium chloride) .Marland Kitchen.. 1 by mouth daily as needed with lasix 5)  Donepezil Hcl 10 Mg Tabs (Donepezil hcl) .Marland Kitchen.. 1 tab by mouth daily 6)  Oxycodone Hcl 5 Mg Tabs (Oxycodone hcl) .... Take one tablet by mouth every 4  hours 7)  Diclofenac Sodium 75 Mg Tbec (Diclofenac sodium) .... One tablet by mouth 2 times daily 8)  Imdur 30 Mg Xr24h-tab (Isosorbide mononitrate) .... One tablet by mouth daily 9)  Prilosec 20 Mg Cpdr (Omeprazole) .... Two capsules by mouth daily 10)  Colace 100 Mg Caps (Docusate sodium) .... One tablet 2 times daily while on narcotics 11)  Gabapentin 300 Mg Caps (Gabapentin) .Marland Kitchen.. 1 by mouth at bedtime 12)  Miralax Powd (Polyethylene glycol 3350) .Marland Kitchen.. 17gm in 8oz fluid daily for constipation as needed 13)  Lactulose 10 Gm/19ml Soln (Lactulose) .... 30 ml by mouth daily to prn  for constipation  Patient Instructions: 1)  Try 3 mg melatoin over the counter for sleep. 2)   No napping. 3)   Try to walk as able. 4)   Call cardiology about cardiac clearance for surgery.  5)   Use lactulose daily for constipation. 6)   Warm compresses on back cyst, neosprin and baindaid. Wash with warm soapy water daily. Prescriptions: LACTULOSE 10 GM/15ML SOLN (LACTULOSE) 30 ml by mouth daily to prn  for constipation  #1 x 11   Entered and Authorized by:   [name] MD   Signed by:   [name] MD on 08/02/2010   Method used:   Electronically to        [facility]* (retail)       [address]       [place], [place]  [address]       Ph: [phone]       Fax: [phone]   RxID:   [id number]    Orders Added: 1)  Est. Patient Level IV [84132]    Current Allergies (reviewed today): No known allergies

## 2010-10-05 NOTE — Progress Notes (Signed)
  Phone Note Other Incoming   Caller: Pts daughter Summary of Call: Pts daughter called and has questions regarding his Gabapentin. She thinks it may be intensivfying his aggitation, aggressive issues and temper. SHe would like to know if he can stopp this medication to see if it will help with these issues.  2. She also states that the Jayln and temazepam are not helping . Please Advise Initial call taken by: Ami Bullins CMA,  September 06, 2010 1:43 PM  Follow-up for Phone Call        may stop gabapentin to see if behavior changes. No usually known to have this type of effect.   May stop jayln if no improvement in urinary problems.  When he seems more stable will try vesicare or similar product.  Follow-up by: Jacques Navy MD,  September 07, 2010 12:55 PM  Additional Follow-up for Phone Call Additional follow up Details #1::        left message on machine for pt to return my call. Margaret Pyle, CMA  September 08, 2010 12:51 PM  Pt's daughter advised of above and  will monitor pt's sxs. Daughter will call back to update MD accordingly Additional Follow-up by: Margaret Pyle, CMA,  September 08, 2010 1:13 PM

## 2010-10-05 NOTE — Progress Notes (Signed)
Summary: Will you see pt at the hospital  Phone Note Call from Patient Call back at Home Phone [phone]   Caller: Daughter - [name] Summary of Call: Pt's daughter called again, upset that Dr Debby Bud has not come by to see pt. She says that Dr Debby Bud told him he would see him in the hospital after the shoulder surgery so that the pt would not have to come into the office for f/u.  Initial call taken by: Lamar Sprinkles, CMA,  September 22, 2010 11:30 AM  Follow-up for Phone Call        first day he was not on the floor. Second day I must admit that I got a little busy. He is on my list and I will get there when I can - possibly tonight. I have not been asked to see him in consult by the orthopods, thus he must be medically stable. Follow-up by: Jacques Navy MD,  September 22, 2010 1:22 PM  Additional Follow-up for Phone Call Additional follow up Details #1::        Daughter informed Additional Follow-up by: Lamar Sprinkles, CMA,  September 22, 2010 1:36 PM

## 2010-10-05 NOTE — Progress Notes (Signed)
Summary: Inpatient  Phone Note Call from Patient   Caller: Daugher [phone] Summary of Call: Pt is at the hosptial for shoulder replacement today.  Initial call taken by: Lamar Sprinkles, CMA,  September 19, 2010 11:16 AM  Follow-up for Phone Call        noted.  Follow-up by: Jacques Navy MD,  September 21, 2010 9:06 AM

## 2010-10-05 NOTE — Letter (Signed)
Summary: Pre-Op Clearance Form,[facility]  Pre-Op Clearance Form,[facility]   Imported By: Beau Fanny 08/16/2010 15:35:54  _____________________________________________________________________  External Attachment:    Type:   Image     Comment:   External Document

## 2010-10-06 ENCOUNTER — Ambulatory Visit: Admit: 2010-10-06 | Payer: Self-pay | Admitting: Family Medicine

## 2010-10-06 ENCOUNTER — Ambulatory Visit: Payer: Self-pay | Admitting: Family Medicine

## 2010-10-10 ENCOUNTER — Ambulatory Visit: Payer: Self-pay | Admitting: Family Medicine

## 2010-10-13 NOTE — Discharge Summary (Signed)
[name] Elliott, WHACK NO.:  [id number]  MEDICAL RECORD NO.:  [id number]          PATIENT TYPE:  INP  LOCATION:  5032                         FACILITY:  [facility]  PHYSICIAN:  Claude Manges. Sharvil Hoey, M.D.DATE OF BIRTH:  08-15-1928  DATE OF ADMISSION:  09/19/2010 DATE OF DISCHARGE:  09/22/2010                        DISCHARGE SUMMARY - REFERRING   ADMISSION DIAGNOSIS:  Left shoulder possible avascular necrosis status post fall with posterior dislocation and impacted humeral head.  DISCHARGE DIAGNOSES: 1. Chronic posterior dislocation, left shoulder with impacted humeral     head. 2. Coronary artery disease. 3. History of hypertension. 4. Cardiac arrhythmia. 5. Hypokalemia. 6. Acute blood loss anemia.  PROCEDURE:  Left shoulder hemiarthroplasty.  HISTORY:  [name] Elliott is an 77 year old white male status post left humeral head fracture in September 2011 after a fall.  He had a right rotator cuff repair performed several weeks before this fall.  At that time, he had an impaction fracture.  Initial conservative treatment was performed.  On his visit in November 2011 it was noted that he had some changes in his x-ray with some posterior subluxation of the humeral head with further impaction.  According to his daughter, apparently he was taking his sling off and going out and chopping wound and doing activities since he lives alone.  His pain had begin to worsen.  His pain now is so severe that he is having limited function because of this.  Indicated at this time for left shoulder hemiarthroplasty.  HOSPITAL COURSE:  An 77 year old male admitted September 19, 2009 after appropriate laboratory studies were obtained as well as 1g of Ancef IV on-call to the operating room.  He was taken to the operating room where he underwent a left shoulder hemiarthroplasty after open reduction of a dislocated left humeral head.  This was performed by Dr. Norlene Campbell and assisted by  Oris Drone. Petrarca, PA-C.  Components were DePuy global advantage 10 mm humeral stem with a 44-mm outer diameter eccentric humeral head with an 18-mm neck length.  He tolerated the procedure well.  He did have a Foley placed intraoperatively.  Postoperatively, he was continued on Ancef 1g IV q.6 h. for 3 doses. Consultation with occupational therapy was ordered.  X-rays of an AP left shoulder and transthoracic in the immobilizer revealed good position of the prosthesis.  He was allowed out of bed to chair the following day. Weaned off of his PCA.  His Foley was discontinued.  Social service was consulted for planning to [facility].  He was hypokalemic and was given 20 mEq of KCL in addition to his normal dosing on the 18th and the 19th.  Occupational therapy was consulted for pendulum exercises with passive flexion with no abduction and no external rotation beyond neutral.  Remainder of his hospital course was uneventful and he was discharged on 20th to return back to the office in follow-up on October 02, 2009.  LABORATORY DATA:  He was admitted with a hemoglobin of 14.5, hematocrit 42.6%, white count 7300, platelet 175,000.  Discharge hemoglobin 11.5, hematocrit 35.0, white count 10,900, platelets 152,000.  Pro time of 13.4, INR  1.00, PTT was 28 on admission.  Preop sodium 137, potassium 3.7, chloride 106, CO2 24, glucose 108, BUN 10, creatinine 1.00, GFR greater than 60, bilirubin 0.5, alk phos 71, GOT 19, GPT 11, total protein 6.8, albumin 4.0 and the calcium was 9.1.  Discharge sodium 139, potassium 3.5, chloride 103, CO2 of 27, glucose 152, BUN 12, creatinine 0.89, GFR greater than 60, calcium 8.7.  He did drop to a potassium of 3.4 on the 19th of January.  RADIOGRAPHIC STUDIES:  Chest x-ray of September 13, 2009 reveals no acute abnormalities.  Old compression fracture of T11.  Shoulder postop films revealed on the 17th proximal humeral metallic hardware component in place with  no evidence of dislocation or disruption of hardware.  DISCHARGE INSTRUCTIONS:  He is to have a regular diet.  He can increase activities slowly as tolerated.  No lifting or driving for 6 weeks.  He may shower with a small bandage over the incision.  He is not to be doing any weightbearing on the left arm.  Use TED hose during the day and off at night for at least 3 weeks.  In regards to his occupational therapy he is allowed to do pendulum exercises and passive flexion.  He is not to do any abduction exercises.  No external rotation beyond neutral.  He needs to follow back up with [place] on the October 02, 2010 on Monday with [name].  Please call the office to make sure that an appointment has been scheduled.  He was discharged in improved condition.     Oris Drone Petrarca, P.A.-C.   ______________________________ Claude Manges. [name], M.D.    BDP/MEDQ  D:  09/22/2010  T:  09/22/2010  Job:  [id number]  Electronically Signed by Jacqualine Code P.A.-C. on 09/28/2010 02:24:19 PM Electronically Signed by Norlene Campbell M.D. on 10/13/2010 01:29:59 PM

## 2010-10-19 NOTE — Letter (Signed)
Summary: Self Health Assessment  Self Health Assessment   Imported By: Lester Oakdale 10/12/2010 09:49:42  _____________________________________________________________________  External Attachment:    Type:   Image     Comment:   External Document

## 2010-10-20 ENCOUNTER — Ambulatory Visit: Payer: Self-pay | Admitting: Internal Medicine

## 2010-10-31 ENCOUNTER — Telehealth (INDEPENDENT_AMBULATORY_CARE_PROVIDER_SITE_OTHER): Payer: Self-pay | Admitting: *Deleted

## 2010-11-09 NOTE — Miscellaneous (Signed)
Summary: Evalution/[facility]  Evalution/[facility]   Imported By: Sherian Rein 11/03/2010 07:18:13  _____________________________________________________________________  External Attachment:    Type:   Image     Comment:   External Document

## 2010-11-09 NOTE — Progress Notes (Signed)
  Phone Note Other Incoming   Request: Send information Summary of Call:  Request for records received from [facility], 16pgs sent to Dr. Debby Bud.

## 2010-11-15 LAB — CBC
HCT: 38.4 % — ABNORMAL LOW (ref 39.0–52.0)
Hemoglobin: 12 g/dL — ABNORMAL LOW (ref 13.0–17.0)
Hemoglobin: 12.2 g/dL — ABNORMAL LOW (ref 13.0–17.0)
Hemoglobin: 12.8 g/dL — ABNORMAL LOW (ref 13.0–17.0)
MCH: 29.3 pg (ref 26.0–34.0)
MCH: 29.5 pg (ref 26.0–34.0)
MCHC: 33.3 g/dL (ref 30.0–36.0)
Platelets: 241 10*3/uL (ref 150–400)
Platelets: 264 10*3/uL (ref 150–400)
RBC: 4.09 MIL/uL — ABNORMAL LOW (ref 4.22–5.81)
RBC: 4.23 MIL/uL (ref 4.22–5.81)
RBC: 4.37 MIL/uL (ref 4.22–5.81)
RDW: 13.2 % (ref 11.5–15.5)
RDW: 13.2 % (ref 11.5–15.5)
WBC: 6.8 10*3/uL (ref 4.0–10.5)
WBC: 7.8 10*3/uL (ref 4.0–10.5)

## 2010-11-15 LAB — GLUCOSE, CAPILLARY
Glucose-Capillary: 107 mg/dL — ABNORMAL HIGH (ref 70–99)
Glucose-Capillary: 112 mg/dL — ABNORMAL HIGH (ref 70–99)
Glucose-Capillary: 119 mg/dL — ABNORMAL HIGH (ref 70–99)
Glucose-Capillary: 124 mg/dL — ABNORMAL HIGH (ref 70–99)
Glucose-Capillary: 127 mg/dL — ABNORMAL HIGH (ref 70–99)
Glucose-Capillary: 130 mg/dL — ABNORMAL HIGH (ref 70–99)
Glucose-Capillary: 130 mg/dL — ABNORMAL HIGH (ref 70–99)
Glucose-Capillary: 153 mg/dL — ABNORMAL HIGH (ref 70–99)
Glucose-Capillary: 159 mg/dL — ABNORMAL HIGH (ref 70–99)
Glucose-Capillary: 88 mg/dL (ref 70–99)

## 2010-11-15 LAB — BASIC METABOLIC PANEL
BUN: 13 mg/dL (ref 6–23)
BUN: 15 mg/dL (ref 6–23)
CO2: 22 mEq/L (ref 19–32)
CO2: 25 mEq/L (ref 19–32)
CO2: 28 mEq/L (ref 19–32)
Calcium: 8 mg/dL — ABNORMAL LOW (ref 8.4–10.5)
Calcium: 8.2 mg/dL — ABNORMAL LOW (ref 8.4–10.5)
Chloride: 102 mEq/L (ref 96–112)
Chloride: 104 mEq/L (ref 96–112)
Creatinine, Ser: 1.06 mg/dL (ref 0.4–1.5)
Creatinine, Ser: 1.1 mg/dL (ref 0.4–1.5)
Creatinine, Ser: 1.11 mg/dL (ref 0.4–1.5)
Creatinine, Ser: 1.21 mg/dL (ref 0.4–1.5)
GFR calc Af Amer: >60 mL/min (ref >60)
GFR calc Af Amer: >60 mL/min (ref >60)
GFR calc Af Amer: >60 mL/min (ref >60)
GFR calc non Af Amer: >60 mL/min (ref >60)
GFR calc non Af Amer: >60 mL/min (ref >60)
Glucose, Bld: 113 mg/dL — ABNORMAL HIGH (ref 70–99)
Sodium: 129 mEq/L — ABNORMAL LOW (ref 135–145)
Sodium: 134 mEq/L — ABNORMAL LOW (ref 135–145)
Sodium: 140 mEq/L (ref 135–145)

## 2010-11-15 LAB — DIFFERENTIAL
Basophils Absolute: 0 10*3/uL (ref 0.0–0.1)
Basophils Absolute: 0 10*3/uL (ref 0.0–0.1)
Eosinophils Relative: 2 % (ref 0–5)
Lymphocytes Relative: 19 % (ref 12–46)
Lymphocytes Relative: 22 % (ref 12–46)
Lymphs Abs: 1.5 10*3/uL (ref 0.7–4.0)
Lymphs Abs: 1.5 10*3/uL (ref 0.7–4.0)
Neutro Abs: 4.5 10*3/uL (ref 1.7–7.7)
Neutro Abs: 5.3 10*3/uL (ref 1.7–7.7)
Neutrophils Relative %: 66 % (ref 43–77)
Neutrophils Relative %: 68 % (ref 43–77)

## 2010-11-15 LAB — URINALYSIS, ROUTINE W REFLEX MICROSCOPIC
Bilirubin Urine: NEGATIVE
Glucose, UA: NEGATIVE mg/dL
Ketones, ur: NEGATIVE mg/dL
Leukocytes, UA: NEGATIVE
Nitrite: NEGATIVE
Specific Gravity, Urine: 1.011 (ref 1.005–1.030)
pH: 5.5 (ref 5.0–8.0)

## 2010-11-15 LAB — EXPECTORATED SPUTUM ASSESSMENT W GRAM STAIN, RFLX TO RESP C

## 2010-11-15 LAB — URINE MICROSCOPIC-ADD ON

## 2010-11-15 LAB — SEDIMENTATION RATE: Sed Rate: 66 mm/hr — ABNORMAL HIGH (ref 0–16)

## 2010-11-15 LAB — C-REACTIVE PROTEIN: CRP: 20.8 mg/dL — ABNORMAL HIGH (ref <0.6)

## 2010-11-16 LAB — CULTURE, BLOOD (ROUTINE X 2)
Culture: NO GROWTH
Culture: NO GROWTH
Culture: NO GROWTH

## 2010-11-16 LAB — BASIC METABOLIC PANEL
BUN: 20 mg/dL (ref 6–23)
CO2: 24 mEq/L (ref 19–32)
Calcium: 8.6 mg/dL (ref 8.4–10.5)
Calcium: 8.8 mg/dL (ref 8.4–10.5)
Creatinine, Ser: 1.16 mg/dL (ref 0.4–1.5)
Creatinine, Ser: 1.05 mg/dL (ref 0.4–1.5)
GFR calc Af Amer: >60 mL/min (ref >60)
GFR calc Af Amer: >60 mL/min (ref >60)
GFR calc non Af Amer: >60 mL/min (ref >60)
GFR calc non Af Amer: 57 mL/min — ABNORMAL LOW (ref >60)
GFR calc non Af Amer: >60 mL/min (ref >60)
Glucose, Bld: 145 mg/dL — ABNORMAL HIGH (ref 70–99)
Glucose, Bld: 137 mg/dL — ABNORMAL HIGH (ref 70–99)
Potassium: 5.2 mEq/L — ABNORMAL HIGH (ref 3.5–5.1)
Potassium: 3.7 mEq/L (ref 3.5–5.1)
Sodium: 140 mEq/L (ref 135–145)

## 2010-11-16 LAB — CBC
HCT: 40.1 % (ref 39.0–52.0)
HCT: 40.4 % (ref 39.0–52.0)
Hemoglobin: 13.8 g/dL (ref 13.0–17.0)
Hemoglobin: 14.6 g/dL (ref 13.0–17.0)
MCH: 31.6 pg (ref 26.0–34.0)
MCH: 31.7 pg (ref 26.0–34.0)
MCHC: 34.2 g/dL (ref 30.0–36.0)
MCHC: 34.3 g/dL (ref 30.0–36.0)
MCHC: 34.6 g/dL (ref 30.0–36.0)
MCV: 88.1 fL (ref 78.0–100.0)
Platelets: 257 10*3/uL (ref 150–400)
Platelets: 171 10*3/uL (ref 150–400)
Platelets: 179 10*3/uL (ref 150–400)
Platelets: 233 10*3/uL (ref 150–400)
RBC: 4.03 MIL/uL — ABNORMAL LOW (ref 4.22–5.81)
RBC: 4.39 MIL/uL (ref 4.22–5.81)
RBC: 4.41 MIL/uL (ref 4.22–5.81)
RDW: 13.1 % (ref 11.5–15.5)
RDW: 14.3 % (ref 11.5–15.5)
RDW: 14.4 % (ref 11.5–15.5)
WBC: 12 10*3/uL — ABNORMAL HIGH (ref 4.0–10.5)
WBC: 11.9 10*3/uL — ABNORMAL HIGH (ref 4.0–10.5)
WBC: 14.7 10*3/uL — ABNORMAL HIGH (ref 4.0–10.5)
WBC: 8.8 10*3/uL (ref 4.0–10.5)

## 2010-11-16 LAB — CK TOTAL AND CKMB (NOT AT ARMC)
CK, MB: 2.8 ng/mL (ref 0.3–4.0)
Relative Index: 0.7 (ref 0.0–2.5)

## 2010-11-16 LAB — GLUCOSE, CAPILLARY
Glucose-Capillary: 123 mg/dL — ABNORMAL HIGH (ref 70–99)
Glucose-Capillary: 127 mg/dL — ABNORMAL HIGH (ref 70–99)
Glucose-Capillary: 133 mg/dL — ABNORMAL HIGH (ref 70–99)
Glucose-Capillary: 149 mg/dL — ABNORMAL HIGH (ref 70–99)
Glucose-Capillary: 158 mg/dL — ABNORMAL HIGH (ref 70–99)
Glucose-Capillary: 177 mg/dL — ABNORMAL HIGH (ref 70–99)
Glucose-Capillary: 192 mg/dL — ABNORMAL HIGH (ref 70–99)

## 2010-11-16 LAB — COMPREHENSIVE METABOLIC PANEL
ALT: 23 U/L (ref 0–53)
AST: 33 U/L (ref 0–37)
AST: 45 U/L — ABNORMAL HIGH (ref 0–37)
Albumin: 2.9 g/dL — ABNORMAL LOW (ref 3.5–5.2)
Albumin: 3.4 g/dL — ABNORMAL LOW (ref 3.5–5.2)
Alkaline Phosphatase: 47 U/L (ref 39–117)
BUN: 17 mg/dL (ref 6–23)
CO2: 22 mEq/L (ref 19–32)
Calcium: 9.1 mg/dL (ref 8.4–10.5)
Chloride: 110 mEq/L (ref 96–112)
Creatinine, Ser: 1.07 mg/dL (ref 0.4–1.5)
Creatinine, Ser: 1.14 mg/dL (ref 0.4–1.5)
Creatinine, Ser: 1.36 mg/dL (ref 0.4–1.5)
GFR calc Af Amer: >60 mL/min (ref >60)
GFR calc Af Amer: >60 mL/min (ref >60)
GFR calc non Af Amer: 51 mL/min — ABNORMAL LOW (ref >60)
Potassium: 3.7 mEq/L (ref 3.5–5.1)
Sodium: 141 mEq/L (ref 135–145)
Total Bilirubin: 1.1 mg/dL (ref 0.3–1.2)
Total Bilirubin: 1.2 mg/dL (ref 0.3–1.2)
Total Protein: 6.9 g/dL (ref 6.0–8.3)
Total Protein: 7.6 g/dL (ref 6.0–8.3)

## 2010-11-16 LAB — DIFFERENTIAL
Basophils Absolute: 0 10*3/uL (ref 0.0–0.1)
Basophils Absolute: 0 10*3/uL (ref 0.0–0.1)
Basophils Absolute: 0.1 10*3/uL (ref 0.0–0.1)
Basophils Relative: 0 % (ref 0–1)
Basophils Relative: 0 % (ref 0–1)
Basophils Relative: 1 % (ref 0–1)
Eosinophils Absolute: 0 10*3/uL (ref 0.0–0.7)
Eosinophils Relative: 0 % (ref 0–5)
Eosinophils Relative: 0 % (ref 0–5)
Eosinophils Relative: 1 % (ref 0–5)
Lymphocytes Relative: 15 % (ref 12–46)
Lymphocytes Relative: 20 % (ref 12–46)
Lymphocytes Relative: 5 % — ABNORMAL LOW (ref 12–46)
Lymphocytes Relative: 9 % — ABNORMAL LOW (ref 12–46)
Lymphs Abs: 0.8 10*3/uL (ref 0.7–4.0)
Lymphs Abs: 1.4 10*3/uL (ref 0.7–4.0)
Monocytes Absolute: 1 10*3/uL (ref 0.1–1.0)
Monocytes Absolute: 0.8 10*3/uL (ref 0.1–1.0)
Monocytes Absolute: 0.8 10*3/uL (ref 0.1–1.0)
Monocytes Absolute: 0.9 10*3/uL (ref 0.1–1.0)
Monocytes Relative: 10 % (ref 3–12)
Monocytes Relative: 5 % (ref 3–12)
Neutro Abs: 9.6 10*3/uL — ABNORMAL HIGH (ref 1.7–7.7)
Neutro Abs: 12.5 10*3/uL — ABNORMAL HIGH (ref 1.7–7.7)
Neutro Abs: 13.7 10*3/uL — ABNORMAL HIGH (ref 1.7–7.7)
Neutro Abs: 6.1 10*3/uL (ref 1.7–7.7)
Neutrophils Relative %: 69 % (ref 43–77)
Neutrophils Relative %: 85 % — ABNORMAL HIGH (ref 43–77)
Neutrophils Relative %: 87 % — ABNORMAL HIGH (ref 43–77)
Neutrophils Relative %: 92 % — ABNORMAL HIGH (ref 43–77)

## 2010-11-16 LAB — MAGNESIUM: Magnesium: 1.9 mg/dL (ref 1.5–2.5)

## 2010-11-16 LAB — HEMOGLOBIN A1C
Hgb A1c MFr Bld: 6.4 % — ABNORMAL HIGH (ref <5.7)
Mean Plasma Glucose: 137 mg/dL — ABNORMAL HIGH (ref <117)

## 2010-11-16 LAB — URINALYSIS, ROUTINE W REFLEX MICROSCOPIC
Bilirubin Urine: NEGATIVE
Ketones, ur: NEGATIVE mg/dL
Ketones, ur: 15 mg/dL — AB
Ketones, ur: NEGATIVE mg/dL
Leukocytes, UA: NEGATIVE
Leukocytes, UA: NEGATIVE
Nitrite: NEGATIVE
Nitrite: NEGATIVE
Nitrite: NEGATIVE
Protein, ur: NEGATIVE mg/dL
Protein, ur: 30 mg/dL — AB
Specific Gravity, Urine: 1.016 (ref 1.005–1.030)
Urobilinogen, UA: 1 mg/dL (ref 0.0–1.0)
Urobilinogen, UA: 0.2 mg/dL (ref 0.0–1.0)
Urobilinogen, UA: 0.2 mg/dL (ref 0.0–1.0)

## 2010-11-16 LAB — POCT CARDIAC MARKERS
CKMB, poc: 1.5 ng/mL (ref 1.0–8.0)
Myoglobin, poc: 351 ng/mL (ref 12–200)
Troponin i, poc: <0.05 ng/mL (ref 0.00–0.09)

## 2010-11-16 LAB — PROTIME-INR
INR: 1.36 (ref 0.00–1.49)
INR: 1.21 (ref 0.00–1.49)
Prothrombin Time: 17 seconds — ABNORMAL HIGH (ref 11.6–15.2)

## 2010-11-16 LAB — URINE CULTURE
Culture  Setup Time: 201109302016
Culture: NO GROWTH
Culture: NO GROWTH

## 2010-11-16 LAB — LACTIC ACID, PLASMA: Lactic Acid, Venous: 1.8 mmol/L (ref 0.5–2.2)

## 2010-11-16 LAB — PHOSPHORUS: Phosphorus: 2.4 mg/dL (ref 2.3–4.6)

## 2010-11-16 LAB — URINE MICROSCOPIC-ADD ON

## 2010-11-16 LAB — URIC ACID: Uric Acid, Serum: 4 mg/dL (ref 4.0–7.8)

## 2010-11-17 LAB — CBC
HCT: 42.2 % (ref 39.0–52.0)
Hemoglobin: 14.6 g/dL (ref 13.0–17.0)
MCHC: 34.6 g/dL (ref 30.0–36.0)
RBC: 4.73 MIL/uL (ref 4.22–5.81)

## 2010-11-17 LAB — DIFFERENTIAL
Basophils Absolute: 0.1 10*3/uL (ref 0.0–0.1)
Basophils Relative: 1 % (ref 0–1)
Eosinophils Absolute: 0.2 10*3/uL (ref 0.0–0.7)
Eosinophils Relative: 3 % (ref 0–5)
Lymphs Abs: 2.2 10*3/uL (ref 0.7–4.0)
Neutrophils Relative %: 53 % (ref 43–77)

## 2010-11-17 LAB — SURGICAL PCR SCREEN
MRSA, PCR: NEGATIVE
Staphylococcus aureus: NEGATIVE

## 2010-11-17 LAB — COMPREHENSIVE METABOLIC PANEL
ALT: 17 U/L (ref 0–53)
AST: 23 U/L (ref 0–37)
Alkaline Phosphatase: 70 U/L (ref 39–117)
CO2: 30 mEq/L (ref 19–32)
Calcium: 9.1 mg/dL (ref 8.4–10.5)
Chloride: 103 mEq/L (ref 96–112)
GFR calc non Af Amer: >60 mL/min (ref >60)
Glucose, Bld: 103 mg/dL — ABNORMAL HIGH (ref 70–99)
Sodium: 140 mEq/L (ref 135–145)
Total Bilirubin: 0.6 mg/dL (ref 0.3–1.2)

## 2010-11-20 LAB — GLUCOSE, CAPILLARY
Glucose-Capillary: 128 mg/dL — ABNORMAL HIGH (ref 70–99)
Glucose-Capillary: 132 mg/dL — ABNORMAL HIGH (ref 70–99)
Glucose-Capillary: 142 mg/dL — ABNORMAL HIGH (ref 70–99)
Glucose-Capillary: 159 mg/dL — ABNORMAL HIGH (ref 70–99)
Glucose-Capillary: 90 mg/dL (ref 70–99)
Glucose-Capillary: 99 mg/dL (ref 70–99)

## 2010-11-20 LAB — BASIC METABOLIC PANEL
BUN: 12 mg/dL (ref 6–23)
BUN: 14 mg/dL (ref 6–23)
Calcium: 8.7 mg/dL (ref 8.4–10.5)
Creatinine, Ser: 1.09 mg/dL (ref 0.4–1.5)
Creatinine, Ser: 1.14 mg/dL (ref 0.4–1.5)
GFR calc Af Amer: >60 mL/min (ref >60)
GFR calc non Af Amer: >60 mL/min (ref >60)
GFR calc non Af Amer: >60 mL/min (ref >60)
Glucose, Bld: 94 mg/dL (ref 70–99)
Potassium: 3.5 mEq/L (ref 3.5–5.1)
Potassium: 3.6 mEq/L (ref 3.5–5.1)

## 2010-11-20 LAB — POCT I-STAT 3, ART BLOOD GAS (G3+)
Acid-Base Excess: 1 mmol/L (ref 0.0–2.0)
Bicarbonate: 23.8 mEq/L (ref 20.0–24.0)
pCO2 arterial: 33.5 mmHg — ABNORMAL LOW (ref 35.0–45.0)
pO2, Arterial: 76 mmHg — ABNORMAL LOW (ref 80.0–100.0)

## 2010-11-20 LAB — CBC
HCT: 38 % — ABNORMAL LOW (ref 39.0–52.0)
HCT: 41 % (ref 39.0–52.0)
MCHC: 34.4 g/dL (ref 30.0–36.0)
MCV: 90.7 fL (ref 78.0–100.0)
MCV: 92.6 fL (ref 78.0–100.0)
Platelets: 110 10*3/uL — ABNORMAL LOW (ref 150–400)
Platelets: 118 10*3/uL — ABNORMAL LOW (ref 150–400)
RDW: 14.1 % (ref 11.5–15.5)
RDW: 14.1 % (ref 11.5–15.5)
RDW: 14.3 % (ref 11.5–15.5)

## 2010-11-20 LAB — COMPREHENSIVE METABOLIC PANEL
ALT: 17 U/L (ref 0–53)
AST: 28 U/L (ref 0–37)
Calcium: 8.9 mg/dL (ref 8.4–10.5)
Creatinine, Ser: 1.14 mg/dL (ref 0.4–1.5)
GFR calc Af Amer: >60 mL/min (ref >60)
GFR calc non Af Amer: >60 mL/min (ref >60)
Sodium: 138 mEq/L (ref 135–145)
Total Protein: 6.6 g/dL (ref 6.0–8.3)

## 2010-11-20 LAB — CARDIAC PANEL(CRET KIN+CKTOT+MB+TROPI)
CK, MB: 2.3 ng/mL (ref 0.3–4.0)
Relative Index: 1.2 (ref 0.0–2.5)
Relative Index: 1.2 (ref 0.0–2.5)
Total CK: 190 U/L (ref 7–232)
Troponin I: 0.01 ng/mL (ref 0.00–0.06)
Troponin I: 0.04 ng/mL (ref 0.00–0.06)

## 2010-11-20 LAB — RPR: RPR Ser Ql: NONREACTIVE

## 2010-11-20 LAB — URINALYSIS, ROUTINE W REFLEX MICROSCOPIC
Bilirubin Urine: NEGATIVE
Glucose, UA: 500 mg/dL — AB
Hgb urine dipstick: NEGATIVE
Protein, ur: NEGATIVE mg/dL
Urobilinogen, UA: 1 mg/dL (ref 0.0–1.0)

## 2010-11-20 LAB — CK TOTAL AND CKMB (NOT AT ARMC): Total CK: 224 U/L (ref 7–232)

## 2010-11-20 LAB — DIFFERENTIAL
Eosinophils Absolute: 0 10*3/uL (ref 0.0–0.7)
Eosinophils Relative: 0 % (ref 0–5)
Lymphocytes Relative: 18 % (ref 12–46)
Lymphs Abs: 1.4 10*3/uL (ref 0.7–4.0)
Monocytes Relative: 6 % (ref 3–12)
Neutrophils Relative %: 76 % (ref 43–77)

## 2010-11-20 LAB — POCT CARDIAC MARKERS
CKMB, poc: 1.3 ng/mL (ref 1.0–8.0)
Myoglobin, poc: 116 ng/mL (ref 12–200)

## 2010-11-20 LAB — LACTIC ACID, PLASMA
Lactic Acid, Venous: 1.1 mmol/L (ref 0.5–2.2)
Lactic Acid, Venous: 2.3 mmol/L — ABNORMAL HIGH (ref 0.5–2.2)

## 2010-11-20 LAB — TSH
TSH: 1.547 u[IU]/mL (ref 0.350–4.500)
TSH: 3.992 u[IU]/mL (ref 0.350–4.500)

## 2010-11-20 LAB — BRAIN NATRIURETIC PEPTIDE: Pro B Natriuretic peptide (BNP): 218 pg/mL — ABNORMAL HIGH (ref 0.0–100.0)

## 2010-11-20 LAB — LIPID PANEL: Cholesterol: 131 mg/dL (ref 0–200)

## 2010-11-20 LAB — TROPONIN I: Troponin I: 0.02 ng/mL (ref 0.00–0.06)

## 2010-11-21 ENCOUNTER — Observation Stay (HOSPITAL_COMMUNITY): Payer: Medicare Other

## 2010-11-21 ENCOUNTER — Encounter: Payer: Self-pay | Admitting: Internal Medicine

## 2010-11-21 ENCOUNTER — Telehealth: Payer: Self-pay | Admitting: *Deleted

## 2010-11-21 ENCOUNTER — Ambulatory Visit (INDEPENDENT_AMBULATORY_CARE_PROVIDER_SITE_OTHER): Payer: Medicare Other | Admitting: Internal Medicine

## 2010-11-21 ENCOUNTER — Other Ambulatory Visit: Payer: Self-pay | Admitting: *Deleted

## 2010-11-21 ENCOUNTER — Telehealth: Payer: Self-pay | Admitting: Internal Medicine

## 2010-11-21 VITALS — BP 158/84 | HR 66 | Temp 98.7°F | Wt 160.0 lb

## 2010-11-21 DIAGNOSIS — F05 Delirium due to known physiological condition: Secondary | ICD-10-CM

## 2010-11-21 DIAGNOSIS — F0390 Unspecified dementia without behavioral disturbance: Secondary | ICD-10-CM

## 2010-11-21 LAB — URINALYSIS, MICROSCOPIC ONLY
Glucose, UA: NEGATIVE mg/dL
Ketones, ur: NEGATIVE mg/dL
Leukocytes, UA: NEGATIVE
pH: 6.5 (ref 5.0–8.0)

## 2010-11-21 LAB — COMPREHENSIVE METABOLIC PANEL
ALT: 12 U/L (ref 0–53)
AST: 19 U/L (ref 0–37)
CO2: 29 mEq/L (ref 19–32)
Chloride: 104 mEq/L (ref 96–112)
GFR calc Af Amer: >60 mL/min (ref >60)
GFR calc non Af Amer: >60 mL/min (ref >60)
Potassium: 3.6 mEq/L (ref 3.5–5.1)
Sodium: 141 mEq/L (ref 135–145)
Total Bilirubin: 0.8 mg/dL (ref 0.3–1.2)

## 2010-11-21 LAB — DIFFERENTIAL
Basophils Absolute: 0 10*3/uL (ref 0.0–0.1)
Basophils Relative: 0 % (ref 0–1)
Monocytes Relative: 7 % (ref 3–12)
Neutro Abs: 3.8 10*3/uL (ref 1.7–7.7)
Neutrophils Relative %: 65 % (ref 43–77)

## 2010-11-21 LAB — CBC
Hemoglobin: 13.8 g/dL (ref 13.0–17.0)
RBC: 4.64 MIL/uL (ref 4.22–5.81)

## 2010-11-21 MED ORDER — FUROSEMIDE 40 MG PO TABS: 40.0000 mg | ORAL_TABLET | Freq: Every day | ORAL | Status: DC

## 2010-11-21 NOTE — Telephone Encounter (Signed)
patinet seen and admitted

## 2010-11-21 NOTE — Telephone Encounter (Signed)
Pt's daughter left vm - I called and spoke w/daughter. Pt c/o swelling in his legs this w/e. Cardiology advised pt take 2 lasix in am and 1 pm. Pt woke up this am confused per daughter - could not remember names or phone numbers, or what day it was. No slurred speech, unsure if pt has any other symptoms. Pt lives alone and daughter unable to see pt until this afternoon. Advised ER, she says Pt will refuse. Informed of urgent need for quick eval regarding poss stroke symptoms. She understands but states pt will still refuse. OK to wk in today per MD

## 2010-11-21 NOTE — Telephone Encounter (Signed)
[name], pts daughter calling;  States Mr. Jonavon legs have been swelling for the past week;   States he doubled his Lasix over the weekend to 40mg  BID;  Normally pt. Takes 40mg  daily;  Per lori gerhardt, NP-increase Lasix 40mg  2 tablets in the AM and 1 tablet PM and see Dr. Elease Hashimoto this week;  Appt. Was made for Thursday March 22 @ 3:45;  Daughter was advised and verbalized an understanding.

## 2010-11-21 NOTE — Progress Notes (Signed)
Subjective:    Patient ID: [name] Elliott, male    DOB: 07-01-1928, 77 y.o.   MRN: [id number]  HPI [name] Elliott presents acutely for sudden on-set of memory loss and cognitive change. His daughter noticed this AM that he was not able to remember his medicaitons. Furthermore, he could not remember phone numbers, seemed to have loss of memory for all common things. He has had no falls or head injury. No new medications. Denies fevers, chills, dysuria, cough or respiratory problems. His behavior is very much different from his baseline    Review of Systems  Constitutional: Negative.   HENT: Negative.   Eyes: Negative.   Respiratory: Negative.   Cardiovascular: Negative.   Gastrointestinal: Negative.   Genitourinary: Negative.   Musculoskeletal: Negative.   Neurological: Negative for dizziness, facial asymmetry, speech difficulty, weakness, light-headedness and headaches.  Psychiatric/Behavioral: Positive for confusion. Negative for agitation.       Objective:   Physical Exam  Constitutional: He appears well-developed and well-nourished.       Elderly white male in no acute distress  HENT:  Head: Normocephalic and atraumatic.  Eyes: Conjunctivae and EOM are normal. Pupils are equal, round, and reactive to light.  Neck: Normal range of motion. Neck supple.  Cardiovascular: Normal rate and regular rhythm.  Exam reveals no friction rub.   No murmur heard. Pulmonary/Chest: Effort normal and breath sounds normal.  Neurological: He is alert. He has normal strength. He is disoriented. He displays no tremor. No cranial nerve deficit or sensory deficit. He exhibits normal muscle tone. He displays a negative Romberg sign. Coordination and gait normal.       No facial droop. C&S clear, PERRLA, normally tracks. No deviation tongue. No fasiculations tongue. Able to follow 3 step command. Normal naming. Cannot give day, date, year, birthdate, wedding date, do calculations. Has no memory of ever being  here before.   Skin: Skin is warm and intact.  Psychiatric: His speech is normal and behavior is normal. His mood appears anxious. Thought content is not paranoid and not delusional. He exhibits abnormal recent memory and abnormal remote memory.       Has dementia but now with sudden loss of memory and orientation.          Past Medical History  Diagnosis Date  . Constipation, chronic   . Sebaceous cyst   . Other malaise and fatigue   . Pain in joint, shoulder region   . Persistent disorder of initiating or maintaining sleep   . Pain in limb   . Cervicalgia   . Thrombocytopenia, unspecified   . Other persistent mental disorders due to conditions classified elsewhere   . Other B-complex deficiencies   . Unspecified hypothyroidism   . Pain in limb   . Esophageal reflux   . Pain in limb   . Coronary atherosclerosis of unspecified type of vessel, native or graft   . Pure hypercholesterolemia   . Unspecified essential hypertension   . Arthropathy, unspecified, site unspecified    Past Surgical History  Procedure Date  . Hand surgery     Right crush injury, right fifth digit contracture, limited  motion  . Lumbar spine surgery     2007, Dr [name] Elliott (Neurosurgeon)  . Knee surgery     age 60  . Cataract extraction 2009  . Rotator cuff repair 8.30.2011   History   Social History  . Marital Status: Widowed    Spouse Name: N/A    Number of Children:  N/A  . Years of Education: N/A   Occupational History  . Not on file.   Social History Main Topics  . Smoking status: Never Smoker   . Smokeless tobacco: Not on file  . Alcohol Use: No  . Drug Use: No  . Sexually Active: Not on file   Other Topics Concern  . Not on file   Social History Narrative   2nd grade educationMarried '58, '952 dtr '59, '64, youngest daughter died septic kidneyLives alone, handles all adlsRegular Exercise -  NO   Assessment & Plan:  1. Delerium - patient with a non-focal neuro exam without  deficit. Suspect sudden memory loss, cognitive change may be     Related to occult infection.  Plan - 24 hr observation to [facility]            ID work-u: U/a, CBC, Cmet            CT brain r/o stroke.

## 2010-11-22 ENCOUNTER — Encounter: Payer: Self-pay | Admitting: Cardiovascular Disease

## 2010-11-22 ENCOUNTER — Observation Stay (HOSPITAL_COMMUNITY): Payer: Medicare Other

## 2010-11-22 ENCOUNTER — Observation Stay (HOSPITAL_COMMUNITY)
Admission: AD | Admit: 2010-11-22 | Discharge: 2010-11-23 | Disposition: A | Discharge status: Home / Self Care | Payer: Medicare Other | Source: Ambulatory Visit | Attending: Internal Medicine | Admitting: Internal Medicine

## 2010-11-22 DIAGNOSIS — M1990 Unspecified osteoarthritis, unspecified site: Secondary | ICD-10-CM | POA: Insufficient documentation

## 2010-11-22 DIAGNOSIS — R5381 Other malaise: Secondary | ICD-10-CM

## 2010-11-22 DIAGNOSIS — K219 Gastro-esophageal reflux disease without esophagitis: Secondary | ICD-10-CM | POA: Insufficient documentation

## 2010-11-22 DIAGNOSIS — R4182 Altered mental status, unspecified: Principal | ICD-10-CM | POA: Insufficient documentation

## 2010-11-22 DIAGNOSIS — R5383 Other fatigue: Secondary | ICD-10-CM

## 2010-11-22 DIAGNOSIS — G319 Degenerative disease of nervous system, unspecified: Secondary | ICD-10-CM | POA: Insufficient documentation

## 2010-11-22 DIAGNOSIS — I6789 Other cerebrovascular disease: Secondary | ICD-10-CM | POA: Insufficient documentation

## 2010-11-22 DIAGNOSIS — F0390 Unspecified dementia without behavioral disturbance: Secondary | ICD-10-CM | POA: Insufficient documentation

## 2010-11-22 DIAGNOSIS — F068 Other specified mental disorders due to known physiological condition: Secondary | ICD-10-CM

## 2010-11-22 DIAGNOSIS — E7800 Pure hypercholesterolemia, unspecified: Secondary | ICD-10-CM | POA: Insufficient documentation

## 2010-11-22 DIAGNOSIS — I10 Essential (primary) hypertension: Secondary | ICD-10-CM | POA: Insufficient documentation

## 2010-11-23 ENCOUNTER — Inpatient Hospital Stay (HOSPITAL_COMMUNITY)
Admit: 2010-11-23 | Discharge: 2010-11-23 | Disposition: A | Discharge status: Home / Self Care | Payer: Medicare Other | Attending: Internal Medicine | Admitting: Internal Medicine

## 2010-11-23 ENCOUNTER — Ambulatory Visit: Payer: Self-pay | Admitting: Cardiovascular Disease

## 2010-11-24 NOTE — Consult Note (Signed)
[name] Elliott, [name] Elliott NO.:  [id number]  MEDICAL RECORD NO.:  [id number]           PATIENT TYPE:  O  LOCATION:  1332                         FACILITY:  [facility]  PHYSICIAN:  Levie Heritage, MD       DATE OF BIRTH:  16-Feb-1928  DATE OF CONSULTATION:  11/22/2010 DATE OF DISCHARGE:                                CONSULTATION   REASON FOR CONSULTATION:  Altered mental status in the setting of underlying cognitive decline.  HISTORY OF PRESENT ILLNESS:  This is an 77 year old male with a past medical history of dementia, constipation, sebaceous cyst, limb pain, esophageal reflux, hypercholesterolemia and essential hypertension.  At the present time the patient is a moderate historian, but the majority of the history was obtained by his daughter via the phone.  The patient states that he lives alone and at baseline is able to care for himself as far as cooking.  He drives very small distances.  It is noted that the patient's daughter takes care of all of the patient's bills, but does not believe that he needs a 24/7 assist.  The patient has been seen by Dr. Debby Bud in the past and has been started on Aricept for a cognitive decline over the past year.  However, he is still able to take care of the majority of his ADLs.  On November 29, 2010, the patient called his daughter stating that he could not remember her phone number, however, was able to contact her after finding the phone number on a piece of paper.  When the daughter arrived at the house, he seemed very confused, did not know where he was, did not recall calling her.  The daughter brought him to Dr. Debby Bud' office.  On the drive there, per the daughter, he was confused at the roads that he was on and when entering Dr. Debby Bud' office stated that he did not remember Dr. Debby Bud, although Dr. Debby Bud is his primary care provider.  The patient was admitted to [facility] for further evaluation of decline in  cognition.  In the evaluation, the patient was found to have a negative UA.  His white blood cell count was 5.8.  Blood sugar was 137 and no source of infection.  The patient underwent an MRI, which showed no acute infarct or intracranial abnormalities.  Neurology was consulted for further evaluation of the patient.  PAST MEDICAL HISTORY:  Constipation, sebaceous cyst, fatigue, joint pain, persistent disorder in initiating and maintaining sleep, pain in limb, cervicalgia, thrombocytopenia, B complex deficiency, hypothyroidism, limb pain, esophageal reflux, hypercholesterolemia, essential hypertension.  PAST SURGICAL HISTORY:  Hand surgery, lumbar spine surgery, knee surgery, cataract surgery, rotator cuff repair.  MEDICATIONS:  While in the hospital the patient has been placed on aspirin, Voltaren, Aricept, Lasix, Neurontin, Protonix and Flomax.  ALLERGIES:  NO KNOWN DRUG ALLERGIES.  SOCIAL HISTORY:  The patient is widowed.  He lives by himself but is cared for by his daughter who checks in on him weekly.  Does not smoke, drink or do illicit drugs.  REVIEW OF SYSTEMS:  Positive for pain in the right arm,  decreased memory, depression.  Otherwise negative with the exception of the above.  PHYSICAL EXAMINATION:  VITAL SIGNS:  Blood pressure is 134/66, pulse 74, respiration 20, temperature 98.4. NEUROLOGIC:  The patient is alert at this time.  He is oriented to hospital, state, [facility] [place], but is unable to tell me the date, the month or the year.  The patient was able to recall two of three objects, but refused to spell the word world backwards and refused to try additional tests.  On a mini mental status examination he scored a 20.  On animal counting he scored a 7.  On clock drawing he scored a 2.  Pupils are equal, round and reactive to light and accommodating, conjugate, extraocular movements are intact.  Visual fields grossly intact.  Face is symmetrical.   Tongue is midline.  Uvula is midline. Sensation V1-V3 is full.  Finger-to-nose and heel-to-shin were smooth. The patient showed 5/5 strength throughout.  Deep tendon reflexes were 1+ with downgoing toes.  The patient's drift was negative in the upper and lower extremities.  The patient's sensation was full to pinprick, light touch and vibration. PULMONARY:  Clear to auscultation bilaterally. CARDIOVASCULAR:  S1 and S2 with regular rate and rhythm. NECK:  Negative for bruits and is supple.  LABORATORY DATA:  Sodium is 141, potassium 2.6, chloride 104, CO2 29, BUN 9, creatinine 1.01, glucose 137, white blood cell count 5.8, hemoglobin 13.8, hematocrit 41.3, platelets 140.  IMAGING:  MRI of the brain shows no acute stroke or intracranial abnormalities.  CT of the brain showed no acute stroke, bleed or intracranial abnormality.  ASSESSMENT:  This is an 77 year old male with a 1-year history of decline in memory, placed on Aricept for cognitive decline, now showing increased confusion.I cannot say how much of it is dementia and how much is delerium here however on today's exam, he seems to be improving and is more or less close to his baseline. doubt, there is any primary neurological issue currently addign to hsi  delerium.   RECOMMENDATIONS:  At the present time, his confusion appears to be improving.  However, at this time would recommend continuing with the patient's Aricept. however, nemenda would be a better choice for this patient.  Obtain EEG, B12, folate and RPR and follow up as an outpatient at [facility] with either NP or PA.  I have discussed these findings with Dr. Hoy Morn and he has examined the patient as well.     Felicie Morn, PA-C   ______________________________ Levie Heritage, MD    DS/MEDQ  D:  11/22/2010  T:  11/22/2010  Job:  [id number]  cc:   Levie Heritage, MD  Electronically Signed by Felicie Morn PA-C on 11/24/2010 10:52:56 AM Electronically  Signed by Levie Heritage MD on 11/24/2010 11:16:12 AM

## 2010-12-04 NOTE — Discharge Summary (Signed)
[name], [name] NO.:  [id number]  MEDICAL RECORD NO.:  [id number]           PATIENT TYPE:  O  LOCATION:  1332                         FACILITY:  [facility]  PHYSICIAN:  Rosalyn Gess. Norins, MD  DATE OF BIRTH:  Jun 21, 1928  DATE OF ADMISSION:  11/22/2010 DATE OF DISCHARGE:  11/23/2010                              DISCHARGE SUMMARY   The patient was admitted on November 22, 2010 at 4:12 p.m. and discharge is 1830 hours on November 23, 2010.  ADMITTING DIAGNOSES: 1. Mental status changes. 2. Dementia.  DISCHARGE DIAGNOSES: 1. Mental status changes. 2. Dementia.  CONSULTANT:  Levie Heritage, MD, from Neurology.  PROCEDURES: 1. CT of the brain without contrast on day of admission which was read     out as stable atrophy and chronic small-vessel ischemic changes     with no intracranial findings. 2. MRI of the brain without contrast which was read out as chronic     small-vessel disease, similar appearance to June of 2011 with no     evidence of acute or reversible process.  HISTORY OF PRESENT ILLNESS:  [name] is an 77 year old gentleman with known dementia and also degenerative joint disease.  He was in the usual state of health when he awoke on the morning of admission with a sudden onset of significant memory loss and cognitive change.  His daughter reported that he was unable to recall phone numbers or recent events or other information.  He had had no falls.  No head injury.  He had no new medications.  He had had no fevers, chills, dysuria, cough or respiratory problems, but his behavior was definitely changed from baseline.  For this reason, he was admitted to the hospital to rule out potential underlying cause including ruling out CVA or delirium from infection.  Please see the EMR generated H and P for past medical history, family history, social history and admission examination.  HOSPITAL COURSE:  Neuro:  The patient had a nonfocal neurologic exam  at admission as noted.  His initial laboratories were normal with normal CBC, normal UA, normal metabolic panel.  Imaging studies of the brain were conducted and were normal with no sign of acute stroke or other abnormality.  The patient was seen in consultation by Felicie Morn, PA, for Dr. Levie Heritage.  The opinion of Neurology was this might represent just an acute change related to the patient's underlying dementia given that there was no obvious cause for delirium and no sign or evidence of stroke. Additional laboratory was ordered by Neurology that included a B12 level which came back as normal at 302.  Folic acid was normal at 15.  RPR was nonreactive.  EEG was ordered and is pending at time of discharge dictation.  The patient's mental status did improve, although not quite back to baseline.  He had no other medical problems and was felt to be stable and ready for discharge home.  His family is very close to him and would be watching him closely as well.  DISCHARGE PHYSICAL EXAMINATION:  VITAL SIGNS:  Temperature was of 97.6, blood pressure  was 113/78, pulse was 85, respirations 18, O2 sats 98%. GENERAL APPEARANCE:  This is an elderly gentleman sitting in a chair, dressed and ready to go. HEENT EXAM:  Unremarkable. NEURO EXAM:  The patient is awake, he is alert.  He is oriented to place.  He knows who his doctor is.  He knows where the doctor's office is.  According to his daughter, his memory is much improved.  DISPOSITION:  The patient does appear to be improved and safe to return home with close family supervision.  The patient will be seen in the office in followup in 7 to 10 days.  CONDITION ON DISCHARGE:  The patient's condition at time of discharge dictation is improved.  Please see the medication discharge manager for medications which are unchanged from admission.     Rosalyn Gess Norins, MD     MEN/MEDQ  D:  11/23/2010  T:  11/24/2010  Job:   [id number]  Electronically Signed by Illene Regulus MD on 12/04/2010 09:03:10 AM

## 2010-12-07 ENCOUNTER — Encounter: Payer: Self-pay | Admitting: Internal Medicine

## 2010-12-07 ENCOUNTER — Ambulatory Visit (INDEPENDENT_AMBULATORY_CARE_PROVIDER_SITE_OTHER): Payer: Medicare Other | Admitting: Internal Medicine

## 2010-12-07 DIAGNOSIS — K219 Gastro-esophageal reflux disease without esophagitis: Secondary | ICD-10-CM

## 2010-12-07 DIAGNOSIS — F068 Other specified mental disorders due to known physiological condition: Secondary | ICD-10-CM

## 2010-12-07 MED ORDER — HYDROCODONE-ACETAMINOPHEN 5-325 MG PO TABS: 2.0000 | ORAL_TABLET | Freq: Four times a day (QID) | ORAL | Status: DC | PRN

## 2010-12-07 NOTE — Patient Instructions (Signed)
1. Shoulder pain - Stop the voltaren (diclofenac), may be the cause of the discomfort your are pointing to in your abdomen. We can add hydrocodone/APAP 5/325 3 times a day for pain. Watch for any change in color of the stool - if it becomes black you need to let me know.  2. Pain in the left breast - no mass or suspicious lesion. The tenderness suggests muscle discomfort. 3. Cough with white sputum can be related to heart and fluid in the lung. Continue your present medications. CALL if you have any shortness of breath.

## 2010-12-08 LAB — URINALYSIS, ROUTINE W REFLEX MICROSCOPIC
Bilirubin Urine: NEGATIVE
Glucose, UA: NEGATIVE mg/dL
Ketones, ur: NEGATIVE mg/dL
pH: 7 (ref 5.0–8.0)

## 2010-12-08 LAB — CBC
HCT: 39 % (ref 39.0–52.0)
Hemoglobin: 13.4 g/dL (ref 13.0–17.0)
RBC: 4.21 MIL/uL — ABNORMAL LOW (ref 4.22–5.81)
RDW: 13.2 % (ref 11.5–15.5)

## 2010-12-08 LAB — POCT I-STAT, CHEM 8
BUN: 8 mg/dL (ref 6–23)
Calcium, Ion: 1.07 mmol/L — ABNORMAL LOW (ref 1.12–1.32)
Creatinine, Ser: 0.7 mg/dL (ref 0.4–1.5)
Glucose, Bld: 129 mg/dL — ABNORMAL HIGH (ref 70–99)
TCO2: 24 mmol/L (ref 0–100)

## 2010-12-08 LAB — DIFFERENTIAL
Basophils Absolute: 0.1 10*3/uL (ref 0.0–0.1)
Eosinophils Relative: 0 % (ref 0–5)
Lymphocytes Relative: 25 % (ref 12–46)
Monocytes Absolute: 0.5 10*3/uL (ref 0.1–1.0)
Monocytes Relative: 7 % (ref 3–12)

## 2010-12-08 LAB — POCT CARDIAC MARKERS: Troponin i, poc: <0.05 ng/mL (ref 0.00–0.09)

## 2010-12-09 ENCOUNTER — Encounter: Payer: Self-pay | Admitting: Internal Medicine

## 2010-12-09 NOTE — Progress Notes (Signed)
  Subjective:    Patient ID: [name] Elliott, male    DOB: 16-Dec-1927, 77 y.o.   MRN: [id number]  HPI [name] Elliott was recently hospitalized for acute delerium. Hospital records reviewed. His evaluation including CT brain, MRI brain, EEG, multiple labs were all normal. He was seen consultation by Neurology who opined that he had a transient exacerbation of his dementia. Since discharge he is almost back to his baseline in regard to memory and functional status.  He complains of not feeling well and points to his general abdominal area. He denies chest pain/pressure, exertional symptoms - specifically denies chest pain or increased SOB. He has had no change in bowel habits, appetite or satiety, no N/V, no hemetemesis or hematochezia. He denies any focal weakness.    Review of Systems Review of Systems  Constitutional:  Negative for fever, chills, activity change and unexpected weight change.  HENT:  Negative for ear pain, congestion, neck stiffness and postnasal drip.   Eyes: Negative for pain, discharge and visual disturbance.  Respiratory: Negative for chest tightness and wheezing.   Cardiovascular: Negative for chest pain and palpitations.       [No decreased exercise tolerance Gastrointestinal: [No change in bowel habit. No bloating or gas. No reflux or indigestion Genitourinary: Negative for urgency, frequency, flank pain and difficulty urinating.  Musculoskeletal: Negative for myalgias, back pain, arthralgias and gait problem.  Neurological: Negative for dizziness, tremors, weakness and headaches.  Hematological: Negative for adenopathy.  Psychiatric/Behavioral: Negative for behavioral problems and dysphoric mood.       Objective:   Physical Exam  Nursing note and vitals reviewed. Constitutional: He is oriented to person, place, and time.       Elderly white male in no distress  HENT:  Head: Normocephalic and atraumatic.  Eyes: Conjunctivae and EOM are normal.  Neck: Neck supple.  No thyromegaly present.  Cardiovascular: Normal rate, regular rhythm and normal heart sounds.   Pulmonary/Chest: Effort normal and breath sounds normal.  Abdominal: Soft. Bowel sounds are normal. He exhibits no distension and no mass. There is tenderness. There is no rebound and no guarding.  Musculoskeletal: Normal range of motion. He exhibits no edema.  Lymphadenopathy:    He has no cervical adenopathy.  Neurological: He is alert and oriented to person, place, and time. No cranial nerve deficit.  Skin: Skin is warm and dry.  Psychiatric: His behavior is normal.          Assessment & Plan:  1. Dementia - at his baseline. Will continue all his present medications. His family continues to monitor him closely although he does continue to live alone.   2. G/GERDI- his vague symptoms, in the absence of other focal findings or history, suggests dypepsia secondary to NSAIDs.  Plan - he is instructed to discontinue diclofenac! He is to continue with omeprazole. Call for persistent discomfort or any sign of bleeding.

## 2010-12-11 ENCOUNTER — Encounter: Payer: Self-pay | Admitting: Cardiovascular Disease

## 2010-12-11 ENCOUNTER — Ambulatory Visit (INDEPENDENT_AMBULATORY_CARE_PROVIDER_SITE_OTHER): Payer: Medicare Other | Admitting: Cardiovascular Disease

## 2010-12-11 DIAGNOSIS — I2510 Atherosclerotic heart disease of native coronary artery without angina pectoris: Secondary | ICD-10-CM

## 2010-12-11 DIAGNOSIS — I10 Essential (primary) hypertension: Secondary | ICD-10-CM

## 2010-12-11 NOTE — Progress Notes (Signed)
History of Present Illness:  [name] is an elderly gentleman with a history of coronary artery disease, hypercholesterolemia, and dementia. He is seen today with his daughter. He has had some Complications following rotator cuff surgery. He's not had any specific cardiac problems but has had difficulty in getting over his surgery. He complains of having leg swelling. He admits to eating little bit of extra salt. He also is not very active.  Current Outpatient Prescriptions on File Prior to Visit  Medication Sig Dispense Refill  . aspirin 81 MG EC tablet Take 81 mg by mouth daily.        Marland Kitchen donepezil (ARICEPT) 10 MG tablet Take 10 mg by mouth daily.        . furosemide (LASIX) 40 MG tablet Take 1 tablet (40 mg total) by mouth daily.  30 tablet  3  . gabapentin (NEURONTIN) 300 MG capsule Take 300 mg by mouth at bedtime.        Marland Kitchen HYDROcodone-acetaminophen (NORCO) 5-325 MG per tablet Take 2 tablets by mouth every 6 (six) hours as needed for pain.  90 tablet  3  . isosorbide mononitrate (IMDUR) 30 MG 24 hr tablet Take 30 mg by mouth daily.        . nitroGLYCERIN (NITROSTAT) 0.3 MG SL tablet Place 0.3 mg under the tongue as needed.        Marland Kitchen omeprazole (PRILOSEC) 20 MG capsule Take 40 mg by mouth daily.        . diclofenac (VOLTAREN) 75 MG EC tablet Take 75 mg by mouth 2 (two) times daily.        Marland Kitchen docusate sodium (COLACE) 100 MG capsule Take 100 mg by mouth 2 (two) times daily. While on narcotics       . Dutasteride-Tamsulosin HCl (JALYN) 0.5-0.4 MG CAPS Take 1 capsule by mouth at bedtime.        . temazepam (RESTORIL) 15 MG capsule Take 15 mg by mouth at bedtime as needed.          No Known Allergies  Past Medical History  Diagnosis Date  . Constipation, chronic   . Sebaceous cyst   . Other malaise and fatigue   . Pain in joint, shoulder region   . Persistent disorder of initiating or maintaining sleep   . Pain in limb   . Cervicalgia   . Thrombocytopenia, unspecified   . Other  persistent mental disorders due to conditions classified elsewhere   . Other B-complex deficiencies   . Unspecified hypothyroidism   . Pain in limb   . Esophageal reflux   . Pain in limb   . Coronary atherosclerosis of unspecified type of vessel, native or graft   . Pure hypercholesterolemia   . Unspecified essential hypertension   . Arthropathy, unspecified, site unspecified     Past Surgical History  Procedure Date  . Hand surgery     Right crush injury, right fifth digit contracture, limited  motion  . Lumbar spine surgery     2007, Dr Eloise Harman (Neurosurgeon)  . Knee surgery     age 85  . Cataract extraction 2009  . Rotator cuff repair 8.30.2011    History  Smoking status  . Never Smoker   Smokeless tobacco  . Not on file    History  Alcohol Use No    Family History  Problem Relation Age of Onset  . Breast cancer Mother   . Cancer Brother     Lung  . Diabetes  Daughter     Reviw of Systems:  He's had some episodes of dizziness. He denies any chest pain. He denies any syncope or presyncope. All other systems Reviewed and are negative.  Physical Exam: BP 130/80  Pulse 64  Ht 5\' 6"  (1.676 m)  Wt 160 lb 6.4 oz (72.757 kg)  BMI 25.89 kg/m2 The patient is alert and oriented x 3.  The mood and affect are normal.  The skin is warm and dry.  Color is normal.  The HEENT exam reveals that the sclera are nonicteric.  The mucous membranes are moist.  The carotids are 2+ without bruits.  There is no thyromegaly.  There is no JVD.  The lungs are clear.  The chest wall is non tender.  The heart exam reveals a regular rate with a normal S1 and S2.  There are no murmurs, gallops, or rubs.  The PMI is not displaced.   Abdominal exam reveals good bowel sounds.  There is no guarding or rebound.  There is no hepatosplenomegaly or tenderness.  There are no masses.  Exam of the legs reveal no clubbing, cyanosis, or edema.  The legs are without rashes.  The distal pulses are intact.   Cranial nerves II - XII are intact.  Motor and sensory functions are intact.  The gait is normal.  ECG:  Assessment / Plan:

## 2010-12-11 NOTE — Assessment & Plan Note (Signed)
[name] is doing fairly well. He's not having any specific episodes of chest pain. We'll continue with his same medications.

## 2010-12-11 NOTE — Assessment & Plan Note (Signed)
His blood pressure is well controlled. He will continue with his same medications. I've asked him to decrease his salt intake.

## 2010-12-13 ENCOUNTER — Telehealth: Payer: Self-pay | Admitting: Cardiovascular Disease

## 2010-12-13 NOTE — Telephone Encounter (Signed)
Dr Elease Hashimoto called with request for Asprin to be placed on hold per ortho. Dr Elease Hashimoto given number to call ortho. Alfonso Ramus RN

## 2010-12-13 NOTE — Telephone Encounter (Signed)
Is going to have surgery on Monday 4/16 Last Office Note, Stress Test, EKG, Echo. Questions about if he can stop asprine regemine for surgery. Fax: [phone] I have pulled the chart.

## 2010-12-14 NOTE — Telephone Encounter (Signed)
I discussed the case with Anesthesia.  He should probably be done in the hosp. Moderate-high risk for complicatiions.

## 2010-12-18 ENCOUNTER — Other Ambulatory Visit: Payer: Self-pay | Admitting: Orthopedic Surgery

## 2010-12-18 ENCOUNTER — Ambulatory Visit (HOSPITAL_COMMUNITY)
Admission: RE | Admit: 2010-12-18 | Discharge: 2010-12-18 | Disposition: A | Discharge status: Home / Self Care | Payer: Medicare Other | Source: Ambulatory Visit | Attending: Orthopedic Surgery | Admitting: Orthopedic Surgery

## 2010-12-18 DIAGNOSIS — Z79899 Other long term (current) drug therapy: Secondary | ICD-10-CM | POA: Insufficient documentation

## 2010-12-18 DIAGNOSIS — F0390 Unspecified dementia without behavioral disturbance: Secondary | ICD-10-CM | POA: Insufficient documentation

## 2010-12-18 DIAGNOSIS — G8929 Other chronic pain: Secondary | ICD-10-CM | POA: Insufficient documentation

## 2010-12-18 DIAGNOSIS — I10 Essential (primary) hypertension: Secondary | ICD-10-CM | POA: Insufficient documentation

## 2010-12-18 DIAGNOSIS — M79609 Pain in unspecified limb: Secondary | ICD-10-CM | POA: Insufficient documentation

## 2010-12-18 DIAGNOSIS — I2510 Atherosclerotic heart disease of native coronary artery without angina pectoris: Secondary | ICD-10-CM | POA: Insufficient documentation

## 2010-12-18 DIAGNOSIS — Z7982 Long term (current) use of aspirin: Secondary | ICD-10-CM | POA: Insufficient documentation

## 2010-12-18 DIAGNOSIS — L905 Scar conditions and fibrosis of skin: Secondary | ICD-10-CM | POA: Insufficient documentation

## 2010-12-18 LAB — SURGICAL PCR SCREEN
MRSA, PCR: NEGATIVE
Staphylococcus aureus: NEGATIVE

## 2010-12-18 LAB — CBC
HCT: 40.1 % (ref 39.0–52.0)
Hemoglobin: 13.9 g/dL (ref 13.0–17.0)
MCH: 30.4 pg (ref 26.0–34.0)
MCHC: 34.7 g/dL (ref 30.0–36.0)
MCV: 87.7 fL (ref 78.0–100.0)
Platelets: 160 10*3/uL (ref 150–400)
RBC: 4.57 MIL/uL (ref 4.22–5.81)
RDW: 14.7 % (ref 11.5–15.5)
WBC: 9 10*3/uL (ref 4.0–10.5)

## 2010-12-18 LAB — BASIC METABOLIC PANEL
BUN: 9 mg/dL (ref 6–23)
CO2: 26 mEq/L (ref 19–32)
Calcium: 9.2 mg/dL (ref 8.4–10.5)
Chloride: 104 mEq/L (ref 96–112)
Creatinine, Ser: 1.04 mg/dL (ref 0.4–1.5)
GFR calc Af Amer: >60 mL/min (ref >60)
GFR calc non Af Amer: >60 mL/min (ref >60)
Glucose, Bld: 123 mg/dL — ABNORMAL HIGH (ref 70–99)
Potassium: 2.9 mEq/L — ABNORMAL LOW (ref 3.5–5.1)
Sodium: 139 mEq/L (ref 135–145)

## 2010-12-27 ENCOUNTER — Encounter: Payer: Self-pay | Admitting: Internal Medicine

## 2010-12-27 ENCOUNTER — Other Ambulatory Visit: Payer: Self-pay | Admitting: Internal Medicine

## 2010-12-27 ENCOUNTER — Ambulatory Visit (INDEPENDENT_AMBULATORY_CARE_PROVIDER_SITE_OTHER): Payer: Medicare Other | Admitting: Internal Medicine

## 2010-12-27 VITALS — BP 134/70 | HR 53 | Temp 96.7°F | Wt 161.0 lb

## 2010-12-27 DIAGNOSIS — N63 Unspecified lump in unspecified breast: Secondary | ICD-10-CM

## 2010-12-27 NOTE — Progress Notes (Signed)
  Subjective:    Patient ID: [name] Elliott, male    DOB: 1928-01-06, 77 y.o.   MRN: [id number]  HPIPatient presents with a persisten lump left breast that is painful. There is a tender firm area around left nipple that continues to hurt. No weight loss, no nipple discharge, no change in skin, no report of trauma or injury.  PMH, FamHx and SocHx reviewed for any changes and relevance.     Review of Systems Review of Systems  Constitutional:  Negative for fever, chills, activity change and unexpected weight change.  HENT:  Negative for hearing loss, ear pain, congestion, neck stiffness and postnasal drip.   Eyes: Negative for pain, discharge and visual disturbance.  Respiratory: Negative for chest tightness and wheezing.   Cardiovascular: Negative for chest pain and palpitations.       [No decreased exercise tolerance Gastrointestinal: [No change in bowel habit. No bloating or gas. No reflux or indigestion Genitourinary: Negative for urgency, frequency, flank pain and difficulty urinating.  Musculoskeletal: Negative for myalgias, back pain, arthralgias and gait problem.  Neurological: Negative for dizziness, tremors, weakness and headaches.  Hematological: Negative for adenopathy.  Psychiatric/Behavioral: Negative for behavioral problems and dysphoric mood.       Objective:   Physical Exam Pleasant elderly white male in distress HEENT - wnl Chest - good breath sounds Cor - RRR Breast - left breast with normal skin, no nipple discharge, firm mass in the area of the areola that is tender Ext - right hand in ace-wrap dressing after amputation 5th digit       Assessment & Plan:  1. Breast lump- tender persistent lumb  Plan - [facility]. Patient aware.

## 2010-12-28 ENCOUNTER — Ambulatory Visit
Admission: RE | Admit: 2010-12-28 | Discharge: 2010-12-28 | Disposition: A | Discharge status: Home / Self Care | Payer: Medicare Other | Source: Ambulatory Visit | Attending: Internal Medicine | Admitting: Internal Medicine

## 2010-12-28 ENCOUNTER — Other Ambulatory Visit: Payer: Self-pay | Admitting: Internal Medicine

## 2010-12-28 DIAGNOSIS — N63 Unspecified lump in unspecified breast: Secondary | ICD-10-CM

## 2010-12-29 ENCOUNTER — Telehealth: Payer: Self-pay | Admitting: Internal Medicine

## 2010-12-29 NOTE — Telephone Encounter (Signed)
Mammogram is normal - no evidence of tumor/cancer. Plan - use of warm compresses for acute inflammation.

## 2010-12-29 NOTE — Telephone Encounter (Signed)
Pt's daughter advised and expressed understanding

## 2011-01-10 NOTE — Op Note (Signed)
  NAMECRU, KRITIKOS NO.:  [id number]  MEDICAL RECORD NO.:  [id number]           PATIENT TYPE:  O  LOCATION:  [facility]SC                         FACILITY:  [facility]  PHYSICIAN:  Artist Pais. Nayali Talerico, M.D.DATE OF BIRTH:  Sep 05, 1927  DATE OF PROCEDURE:  12/18/2010 DATE OF DISCHARGE:  12/18/2010                              OPERATIVE REPORT   PREOPERATIVE DIAGNOSIS:  Chronic pain, right small finger.  POSTOPERATIVE DIAGNOSIS:  Chronic pain, right small finger.  PROCEDURE:  Right small finger proximal phalangeal level amputation.  SURGEON:  Artist Pais. Mina Marble, MD  ASSISTANT:  None.  ANESTHESIA:  General.  TOURNIQUET TIME:  31 minutes.  COMPLICATIONS:  No complications.  DRAINS:  No drains.  DESCRIPTION OF PROCEDURE:  The patient was taken to the operating suite. After induction of adequate general anesthesia, right upper extremity was prepped and draped in a sterile fashion.  An Esmarch was used to exsanguinate the limb.  Tourniquet was then inflated to 250 mmHg.  At this point in time, a fishmouth incision was marked on the right small finger at the level of proximal phalanx.  Skin was incised sharply. Dissection was carried down to the volar surface bilateral digital neurectomies were performed with digital nerves allowed to retrieve proximally into the wound.  The flexor and extensor mechanisms were cut transversely.  The PIP joint was identified and disarticulated.  The distal aspect was sent for pathology.  Rongeur was used to smooth out the condyle of the proximal phalanx and the wound was thoroughly irrigated.  Hemostasis achieved with bipolar cautery and loosely closed with 4-0 nylon.  Xeroform, 4 x 4s, and compression wrap was applied. The patient tolerated the procedure well and went to recovery room in stable fashion.     Artist Pais Mina Marble, M.D.    MAW/MEDQ  D:  12/18/2010  T:  12/19/2010  Job:  [id number]  Electronically Signed by Dairl Ponder M.D. on 01/10/2011 09:24:59 AM

## 2011-01-16 NOTE — Op Note (Signed)
NAMETYION, BOYLEN NO.:  [id number]   MEDICAL RECORD NO.:  [id number]          PATIENT TYPE:  AMB   LOCATION:  DSC                          FACILITY:  [facility]   PHYSICIAN:  Artist Pais. Weingold, M.D.DATE OF BIRTH:  09/13/1927   DATE OF PROCEDURE:  02/25/2008  DATE OF DISCHARGE:                               OPERATIVE REPORT   PREOPERATIVE DIAGNOSES:  Right carpal tunnel syndrome, right wrist  internal derangement, and right extensor synovitis.   POSTOPERATIVE DIAGNOSES:  Right carpal tunnel syndrome, right wrist  internal derangement, and right extensor synovitis.   PROCEDURES:  Right wrist arthroscopy with debridement of SL and LT  ligaments, as well as right wrist extensor synovectomy and right carpal  tunnel release, all through separate incisions.   SURGEON:  Artist Pais. Mina Marble, MD   ASSISTANT:  None.   ANESTHESIA:  General.   TOURNIQUET TIME:  41 minutes.   COMPLICATIONS:  No complication.   DRAINS:  No drains.   OPERATIVE REPORT:  The patient was taken to the operating suite.  After  the induction of adequate general anesthesia, right upper extremity was  prepped and draped in usual sterile fashion.  An Esmarch was used to  exsanguinate the limb.  Tourniquet was inflated to 250 mmHg.  At this  point in time, the patient's right upper extremity was padded and placed  in wrist traction tower with 15 pounds countertraction across the  radiocarpal joint.  A standard 3/4 arthroscopic portal was established 1  cm distal to Lister's tubercle.  Skin was incised.  Sharp dissection was  carried down to the capsule.  Blunt dissection was then used to enter  the joint.  Once the joint was entered, visualization revealed  significant synovitis both radially and ulnarly.  There appeared to be  grade 1 partial tears of the SL and LT ligaments. A 4/5 working portal  was established after a 6U outflow portal was established using an 18-  gauge needle.  After  this was done using 2.9 suction shaver, the LT  ligament and SL ligament were debrided, as well as a thorough  debridement of the entire joint surface.  This was also carried out by  switching the instruments from the 4/5 to 3/4 portals.  After this was  done, the instruments were removed from the portals.  The 3/4 portal was  extended distally for 4 cm.  Dissection was carried down to the fourth  dorsal compartment.  There was significant synovitis about the fourth  dorsal compartment.  The extensor tendons were debrided off the synovium  using rongeurs and tenotomy scissors.  A small CMC boss was also excised  at the same time at the base of the index and long CMC joints.  This  wound was then thoroughly irrigated and nicely closed with 3-0 Prolene  subcuticular stitch.  The hand was fully supinated and then a 2-cm  incision was made in the palmar aspect of right hand, in line with the  carpometacarpal starting at [facility] cardinal line.  Skin was incised.  Palmar fascia was identified and split.  Distal edge of the transverse  carpal ligament was identified with a 15 blade.  The median nerve was  identified and protected with Therapist, nutritional.  Remaining aspects of the  transverse carpal ligament then divided under direct vision using curved  and blunt scissors.  Canal was inspected.  There were no osseous lesions  or ganglions present.  It was irrigated and nicely closed with a 3-0  Prolene subcuticular stitch.  Steri-Strips, 4 x 4s fluffs, and a volar  splint was applied.  The patient tolerated all 3 procedures well and  went to recovery room in stable fashion.      Artist Pais Mina Marble, M.D.  Electronically Signed     MAW/MEDQ  D:  02/25/2008  T:  02/26/2008  Job:  [id number]

## 2011-01-19 NOTE — Cardiovascular Report (Signed)
[name], [name] NO.:  [id number]   MEDICAL RECORD NO.:  [id number]          PATIENT TYPE:  OIB   LOCATION:  2899                         FACILITY:  [facility]   PHYSICIAN:  Meade Maw, M.D.    DATE OF BIRTH:  1927-09-24   DATE OF PROCEDURE:  03/30/2005  DATE OF DISCHARGE:                              CARDIAC CATHETERIZATION   REFERRING PHYSICIAN:  Dr. Manus Gunning   INDICATIONS FOR PROCEDURE:  A 77 year old gentleman with chest pain  concerning for angina, inferoapical ischemia demonstrated on Cardiolite.   PROCEDURE:  After obtaining written informed consent the patient was brought  to the cardiac catheterization laboratory in the post absorptive state.  Preoperative sedation was achieved using IV Versed.  The right groin was  prepped and draped in the usual sterile fashion.  Local anesthesia was  achieved using 1% Xylocaine.  A 6-French hemostasis sheath was placed into  the right femoral artery using modified Seldinger technique.  Selective  coronary angiography was performed using a JL4, JR4 Judkins catheter and  multiple views were obtained.  All catheter exchanges were made over a  guidewire.  Single plane ventriculogram was performed in the RAO position  using a 6-French pigtail curved catheter.  Multiple views were obtained.  All catheter exchanges were made over a guidewire.  There was no immediate  complications.  The films were reviewed with Dr. Delane Ginger.  It was felt  that intervention on the LAD/diagonal was indicated.   FINDINGS:  Aortic pressure is 129/60, LV pressure was 120/6.  EDP is 12.  Single plane ventriculogram revealed mild inferior wall hypokinesis with an  ejection fraction of 50%.  There was no significant mitral regurgitation  noted.  Fluoroscopy revealed mild calcification of the left main.   CORONARY ANGIOGRAPHY:  The left main coronary artery bifurcates into the  left anterior descending and circumflex vessel.  There was  luminal  irregularities in the left main coronary artery.   Left anterior descending:  Left anterior descending gives rise to a trivial  D1, small D2, moderate D3.  The left anterior descending goes on to end as  an apical recurrent branch.  There was a 70% mid vessel lesion in the left  anterior descending.  There was a 70-80% lesion in the second diagonal.   Circumflex vessel:  Circumflex vessel is a large caliber vessel.  Gives rise  to a small to moderate OM1, a large OM2, ends as a large posterior lateral  branch.  There is a 40% lesion prior to the second obtuse marginal.   Right coronary artery:  Right coronary artery is dominant for the posterior  circulation.  Gives rise to a small to moderate PDA and a larger PL branch.  There is no significant disease noted.  There is luminal irregularities  noted  in the right coronary artery only.  The films were reviewed with Dr. Delane Ginger.  It was felt that intervention on the left anterior descending plus  or minus diagonal was indicated.  He proceeded immediately post procedure  with the percutaneous revascularization.  There was no  immediate  complications.      Meade Maw, M.D.  Electronically Signed     HP/MEDQ  D:  03/30/2005  T:  03/30/2005  Job:  [id number]

## 2011-01-19 NOTE — Consult Note (Signed)
NAMEISSIAH, HUFFAKER NO.:  [id number]   MEDICAL RECORD NO.:  [id number]          PATIENT TYPE:  OIB   LOCATION:                               FACILITY:  [facility]   PHYSICIAN:  Quita Skye. Hart Rochester, M.D.  DATE OF BIRTH:  07-Jun-1928   DATE OF CONSULTATION:  DATE OF DISCHARGE:                                   CONSULTATION   REFERRING PHYSICIAN:  [name], M.D. and Meade Maw, M.D.   REASON FOR CONSULTATION:  Rule out right iliac dissection during cardiac  catheterization.   HISTORY OF PRESENT ILLNESS:  This 77 year old male patient was admitted with  cardiac symptoms of left anterior chest pain which occurred prior to  admission. He had an abnormal Cardiolite study and was to undergo cardiac  catheterization which was done uneventfully via the right common femoral  artery. Two areas of stenoses in the LAD were found, and Dr. Elease Hashimoto  was  planning to proceed with an intervention, but after each the sheath was  exchanged to a larger size, the guidewire would no longer traverse the right  iliac system. It would only go to the aortic bifurcation were it met  resistance. I was consulted with the patient in the cardiac catheterization  lab during this procedure. Of note was the fact that the patient has a  history of bilateral calf discomfort with walking and was to be evaluated by  CVTS the near future for possible lower extremity arterial occlusive  disease. He received 150 cc of contrast during the cardiac catheterization.   PAST MEDICAL HISTORY:  1.  Hypertension.  2.  Question of hyperlipidemia.   Negative for previous stroke or diabetes.   SOCIAL HISTORY:  The patient never used tobacco. He had a remote history of  alcohol use. He is widowed, lives alone. He is does drive an automobile has  two daughters.   FAMILY HISTORY:  Father died of end-stage renal disease secondary to  Bright's disease. Mother had breast cancer, and a brother has a history of  TIAs, esophageal cancer, and coronary artery disease. Does have a daughter  with insulin-dependent diabetes mellitus.   ALLERGIES:  None known.   MEDICATIONS:  Diltiazem 240 milligrams daily.   PHYSICAL EXAMINATION:  VITAL SIGNS:  The patient was awake and responsive  but slightly sedated.  Blood pressure 140/90, heart rate 64.  GENERAL: He was an elderly male in no apparent distress, alert and oriented  x 3.  PULSES:  Supple, 3+ carotid pulses. Upper extremity pulses 3+.  CHEST:  Clear to auscultation.  ABDOMEN:  Soft, nontender with no masses.  EXTREMITIES:  There was a sheath in the right femoral artery. He had  popliteal and dorsalis pedis pulse of 2 to 3+ the right leg. The left leg  and 3+ femoral. Popliteal, and dorsalis pedis pulses. Both well-perfused.   Arterial Doppler studies were performed with ABIs of 1 bilaterally and  normal wave forms.   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   RECOMMENDATIONS:  Transport the patient to  the PV lab and proceed with a  diagnostic angiogram possibly through the left femoral artery to determine  if the patient had an elevated plaque dissection that might require  stenting. This was discussed with the patient and his daughter who were in  agreement. Risks were discussed, and the patient was to be transferred to  the [facility] lab for further of diagnostic studies.           ______________________________  Quita Skye Hart Rochester, M.D.     JDL/MEDQ  D:  03/30/2005  T:  03/30/2005  Job:  [id number]

## 2011-01-19 NOTE — Op Note (Signed)
NAMENUCHEM, GRATTAN NO.:  [id number]   MEDICAL RECORD NO.:  [id number]          PATIENT TYPE:  OIB   LOCATION:  2899                         FACILITY:  [facility]   PHYSICIAN:  Quita Skye. Hart Rochester, M.D.  DATE OF BIRTH:  1928-05-11   DATE OF PROCEDURE:  03/30/2005  DATE OF DISCHARGE:                                 OPERATIVE REPORT   PREOPERATIVE DIAGNOSIS:  Rule out right iliac dissection during cardiac  catheterization.   PROCEDURES:  1.  Abdominal aortogram with bilateral iliac angiography via left common      femoral approach.  2.  Selective catheterization right common iliac artery with right lower      extremity angiogram.   SURGEON:  Dr. Josephina Gip.   ANESTHESIA:  Local Xylocaine and fentanyl 50 mcg.   CONTRAST:  110 mL.   COMPLICATIONS:  None.   DESCRIPTION OF PROCEDURE:  Patient was taken to [facility] Peripheral  Endovascular Lab from the cardiac cath lab with the right femoral sheath in  place. After prepping and draping in a routine sterile manner. A guidewire  was passed up the right femoral sheath gently and the sheath exchanged for a  new 6-French sheath in the right femoral artery. Initially an attempt was  made to traverse the right iliac system with a Glidewire but this extended  up to the aortic bifurcation, it would not advance any further proximally  apparently being in a subintimal location. Therefore, after infiltration of  1% Xylocaine, the left common femoral artery was entered. Guidewire passed  into the suprarenal aorta under fluoroscopic guidance without difficulty. A  5-French sheath and dilator were passed over the guide, wire dilator removed  and the standard pigtail catheter positioned in the suprarenal aorta. A  flush abdominal aortogram was performed injecting 20 mL contrast at 20 mL  per second. This revealed the aorta to be widely patent and normal in  appearance. Single renal arteries were present bilaterally and widely  patent. The left common internal, external iliac arteries were also widely  patent with no evidence of significant stenosis. The right common iliac,  internal and proximal external iliac artery appeared normal but there was an  area of localized dissection in the right external iliac artery which began  in the mid portion in an area of tortuosity. There was no significant plaque  noted in this area and this did not appear flow limiting. Multiple views  were obtained including RAO, LAO and magnification view in the AP plane and  there was no evidence of any persistent flap or flow limiting problems  related to this. It was decided to perform a right lower extremity angiogram  to be certain no thrombus had gone distally. Therefore, the right common  iliac artery was selectively cannulated using a __________ catheter and  right lower extremity angiogram performed injecting 40 mL of contrast at 5  mL per second. This revealed the dissection as noted. The right common  femoral, superficial profunda femoris, popliteal and tibial vessels all  appeared normal with good flow. Catheter was removed over the guidewire,  both sheaths removed, adequate compression applied. No complications ensued.   FINDINGS:  Normal aortoiliac system with the exception of a localized  dissection in the right external iliac artery in a  retrograde fashion with  normal runoff right leg.       JDL/MEDQ  D:  03/30/2005  T:  03/30/2005  Job:  [id number]

## 2011-02-05 ENCOUNTER — Other Ambulatory Visit: Payer: Self-pay | Admitting: *Deleted

## 2011-02-05 NOTE — Telephone Encounter (Signed)
Ok for prn refills on neurontin. If patient wants can have 90 day Rx.

## 2011-02-05 NOTE — Telephone Encounter (Signed)
This pt is now seeing Dr. Debby Bud. Will forward request to him.

## 2011-02-07 MED ORDER — GABAPENTIN 300 MG PO CAPS: 300.0000 mg | ORAL_CAPSULE | Freq: Every day | ORAL | Status: DC

## 2011-03-12 ENCOUNTER — Other Ambulatory Visit: Payer: Self-pay | Admitting: *Deleted

## 2011-03-12 NOTE — Telephone Encounter (Signed)
Yes I believe this pt has transferred to Dr. Debby Bud at this time forward rx refill request to his nurse please.

## 2011-03-12 NOTE — Telephone Encounter (Signed)
Ok for refill of aricept, prn

## 2011-03-12 NOTE — Telephone Encounter (Signed)
It seems like this patient sees Dr. Debby Bud as the PCP but please advise.

## 2011-03-13 MED ORDER — DONEPEZIL HCL 10 MG PO TABS: 10.0000 mg | ORAL_TABLET | Freq: Every day | ORAL | Status: DC

## 2011-03-13 NOTE — Telephone Encounter (Signed)
Rx Done . 

## 2011-03-26 ENCOUNTER — Other Ambulatory Visit: Payer: Self-pay | Admitting: *Deleted

## 2011-03-26 MED ORDER — POTASSIUM CHLORIDE CRYS ER 20 MEQ PO TBCR: 20.0000 meq | EXTENDED_RELEASE_TABLET | Freq: Two times a day (BID) | ORAL | Status: DC

## 2011-03-26 MED ORDER — POTASSIUM CHLORIDE 20 MEQ PO PACK: 20.0000 meq | PACK | Freq: Every day | ORAL | Status: DC

## 2011-04-02 IMAGING — CR DG ELBOW COMPLETE 3+V*R*
4 series · 4 of 4 positions shown · non-contrast
Comparison: None.

CLINICAL DATA: Fall, elbow pain

RIGHT ELBOW - COMPLETE 3+ VIEW

[view not recorded (1 of 4)]
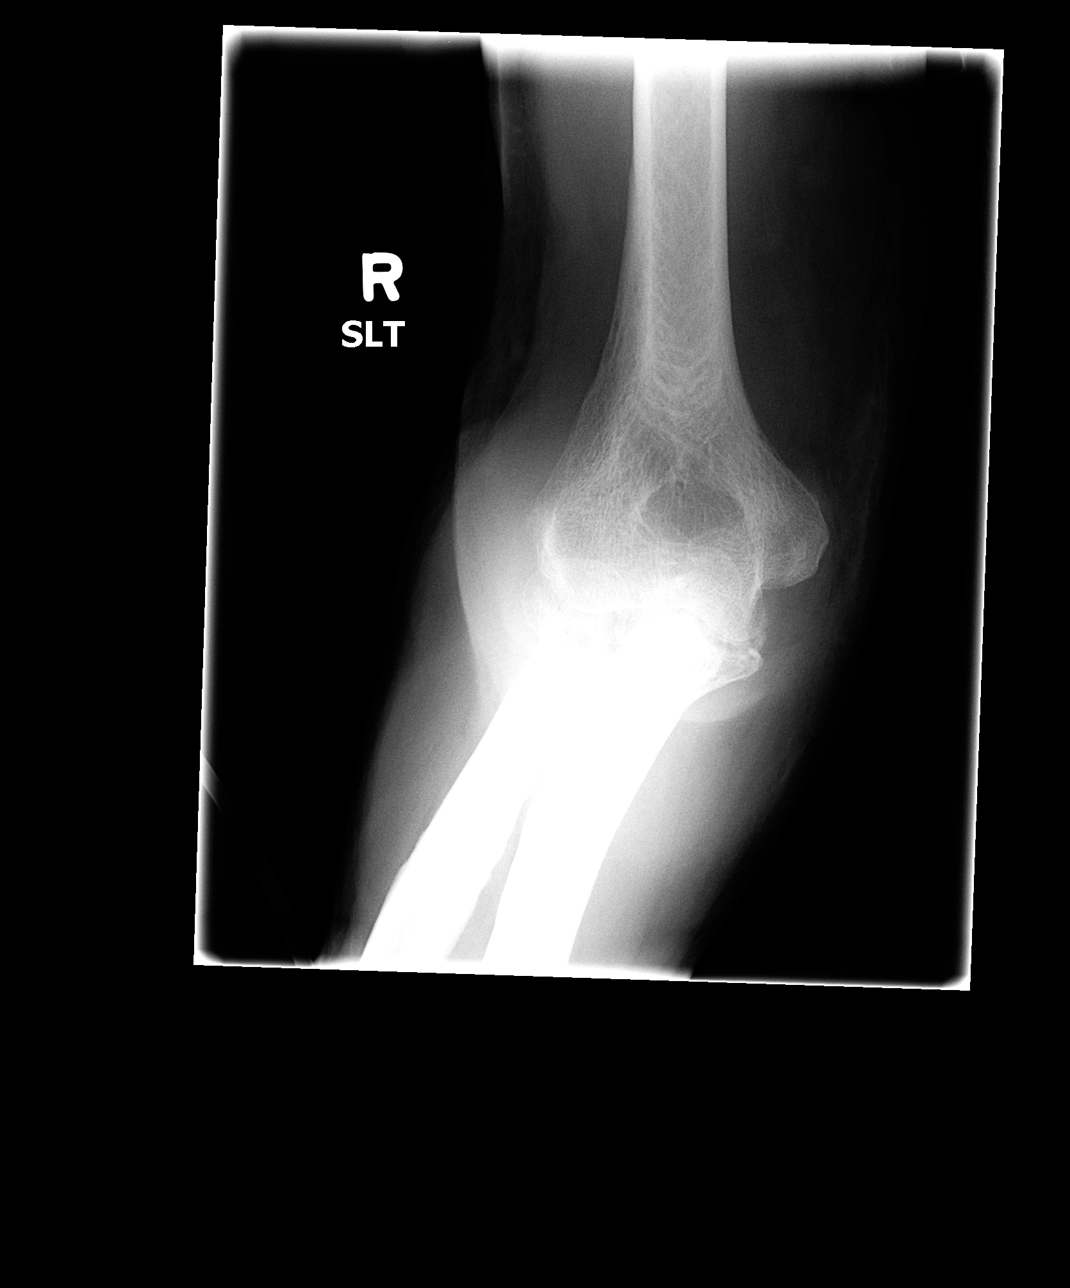

[view not recorded (2 of 4)]
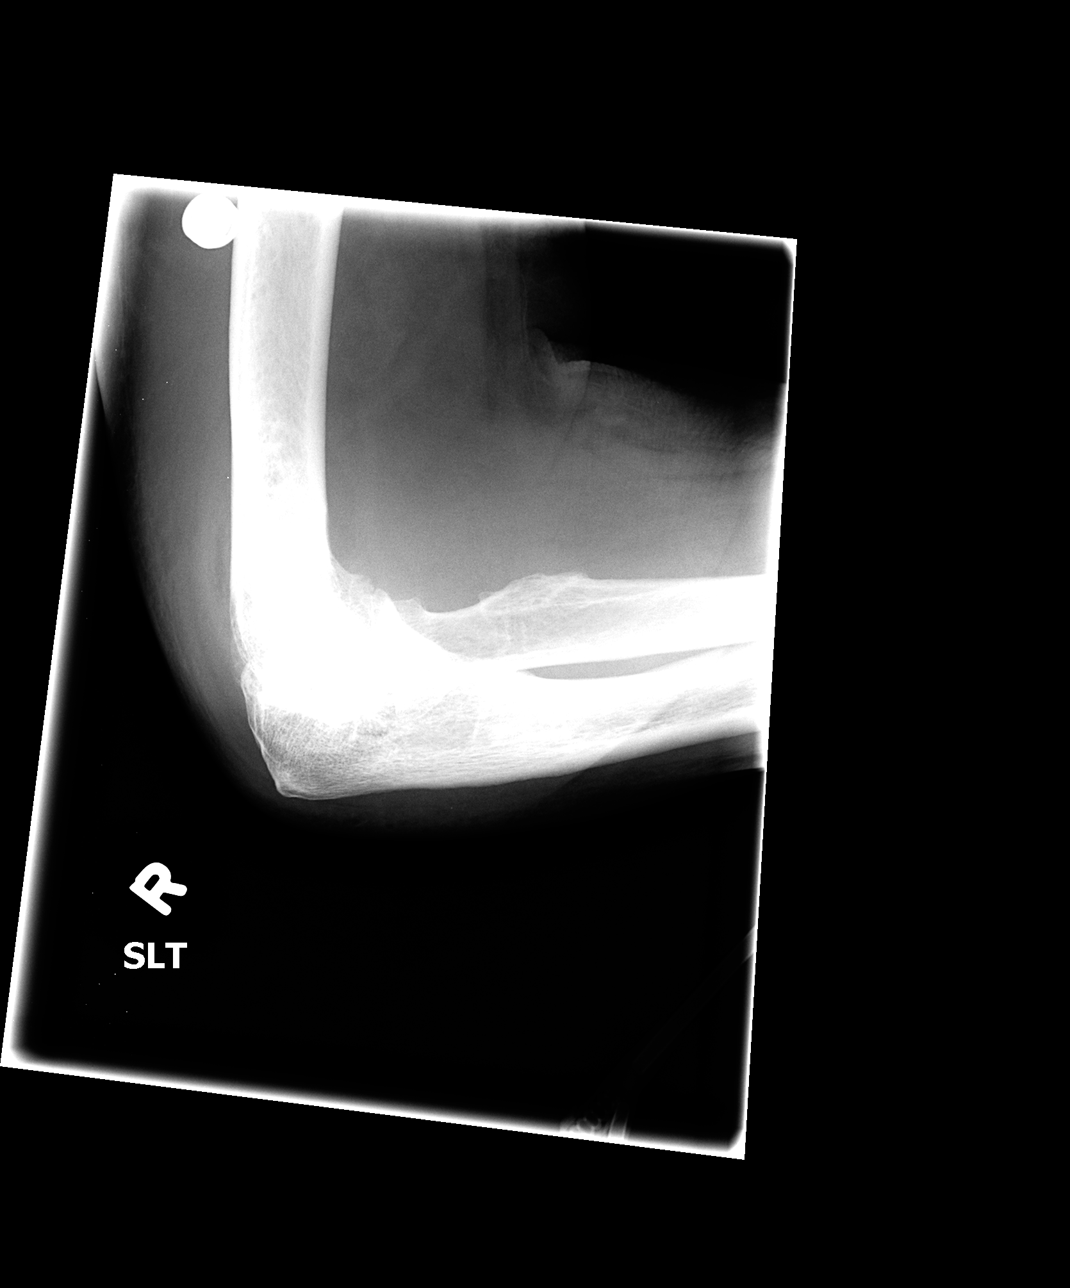

[view not recorded (3 of 4)]
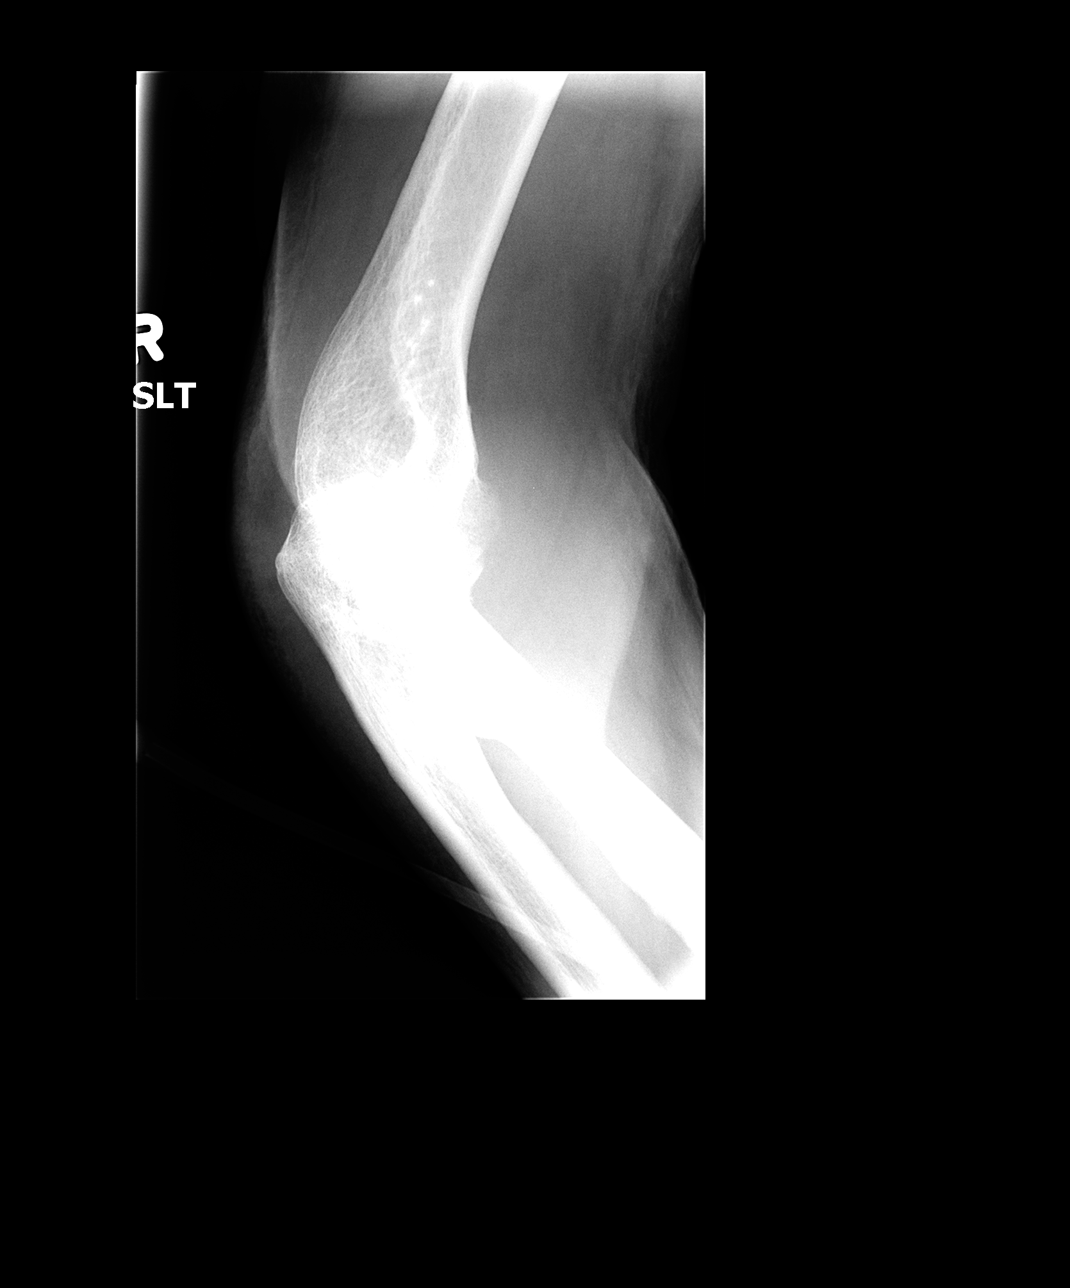

[view not recorded (4 of 4)]
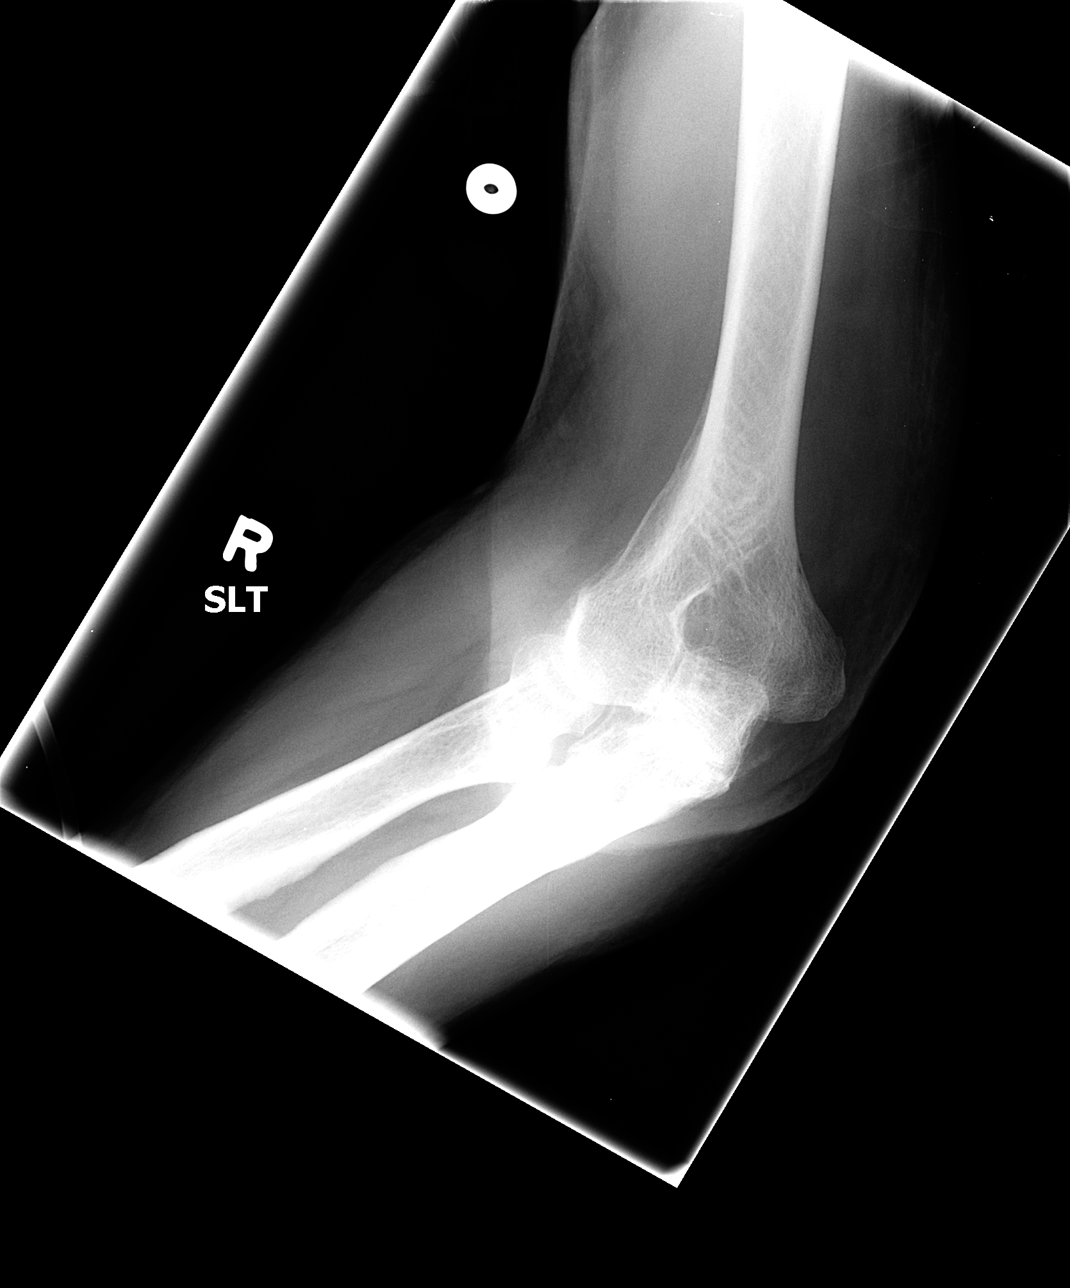

[4 of 4 positions shown; findings below may reference images not displayed]

FINDINGS: Normal alignment.  No displaced fracture.  Degenerative
arthritic changes present.  Lateral view demonstrates a small joint
effusion.  Occult injury not entirely excluded.
IMPRESSION: Degenerative arthritic changes.
Elbow joint effusion.

## 2011-04-03 IMAGING — CR DG CHEST 1V PORT
1 series · 1 of 1 positions shown · non-contrast
Comparison: Chest radiograph 06/02/2010

CLINICAL DATA: Fever, dehydration

PORTABLE CHEST - 1 VIEW

[AP]
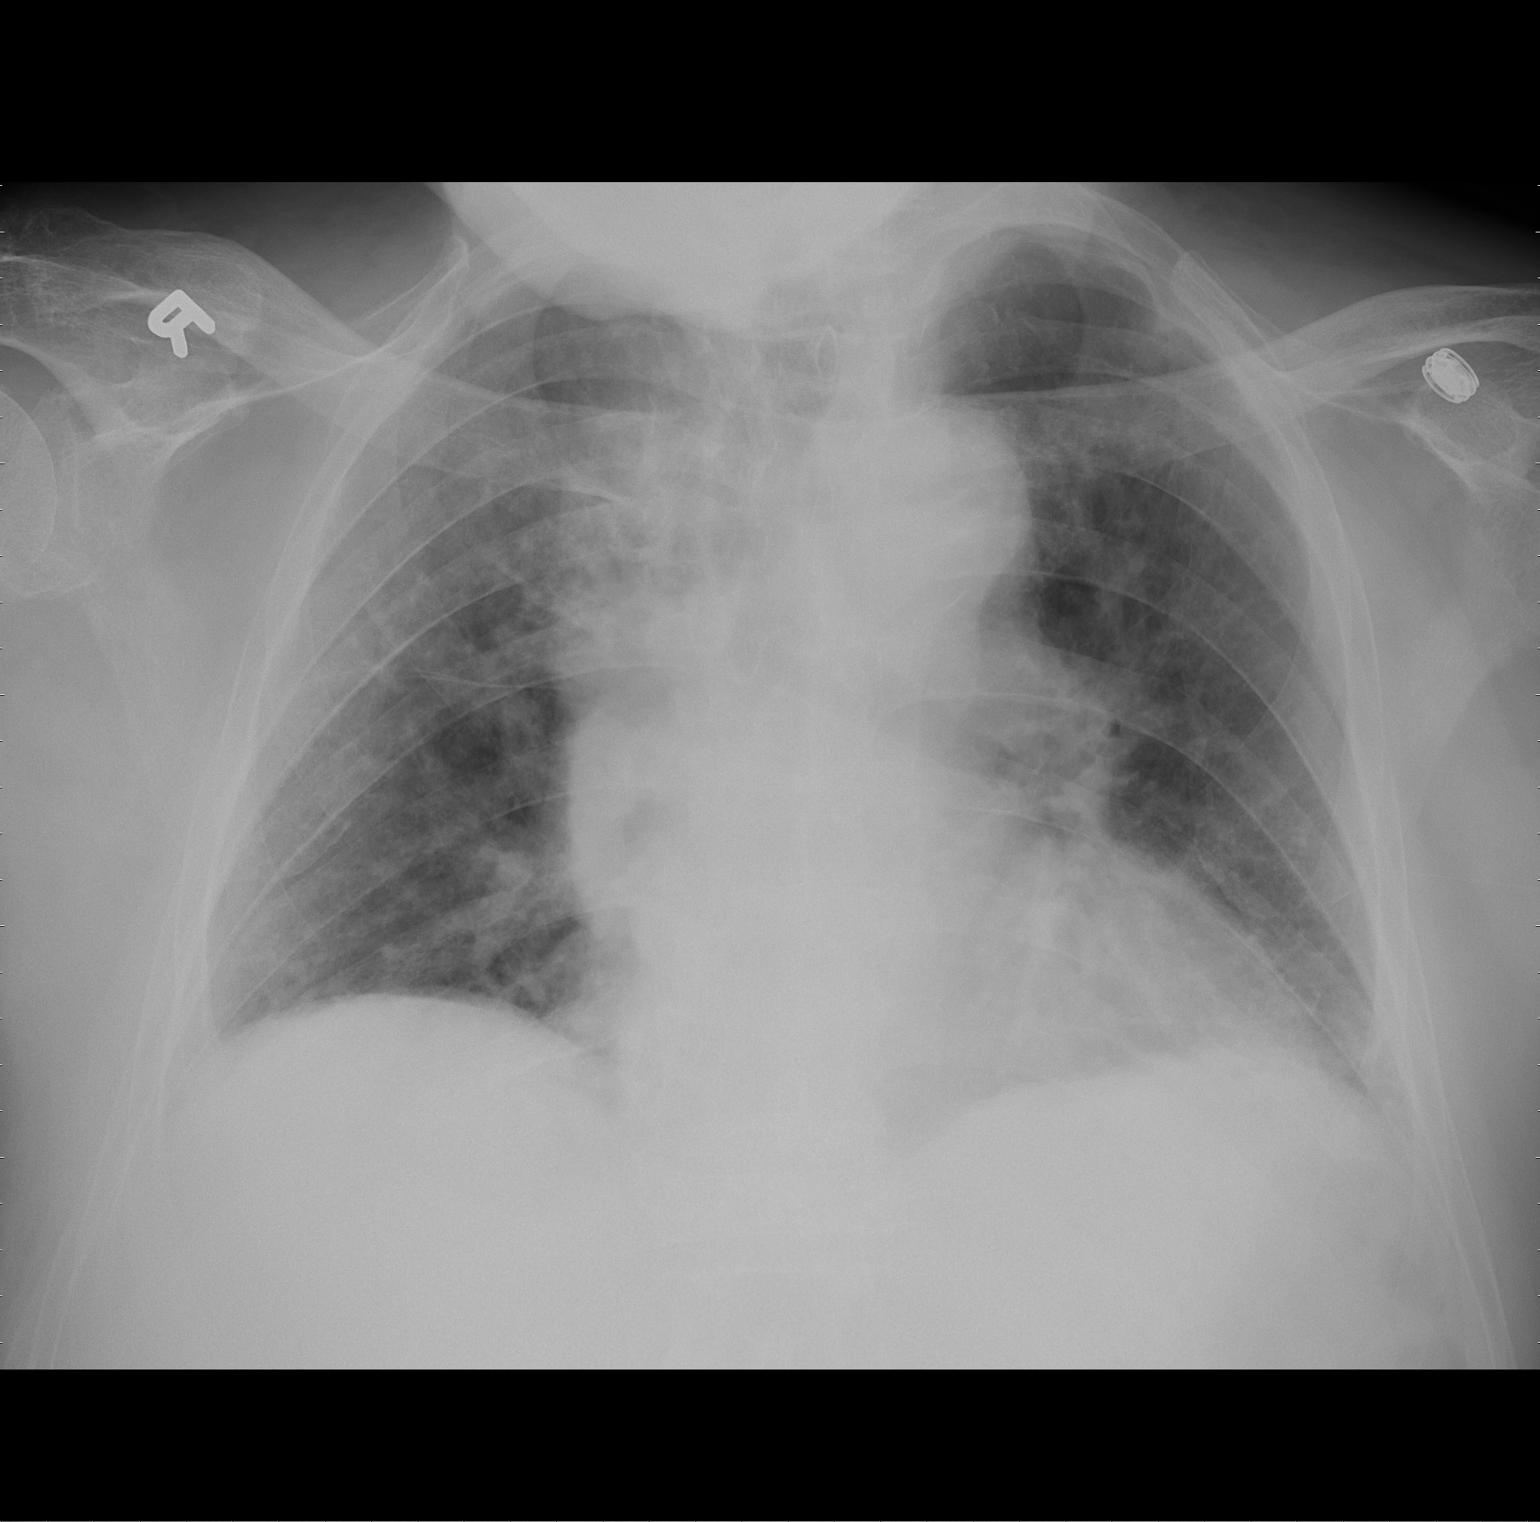

[1 of 1 positions shown; findings below may reference images not displayed]

FINDINGS: The patient is rotated rightward.  Normal mediastinum and
heart silhouette.  Costophrenic angles are clear.  No evidence
effusion, infiltrate, or pneumothorax.  The aorta is ectatic.
There is mild interstitial edema which is similar to prior.
IMPRESSION: 1.  No significant change.
2.  Mild interstitial edema.
3.  Ectatic aorta.

## 2011-04-12 ENCOUNTER — Encounter: Payer: Self-pay | Admitting: Internal Medicine

## 2011-04-12 ENCOUNTER — Ambulatory Visit (INDEPENDENT_AMBULATORY_CARE_PROVIDER_SITE_OTHER): Payer: Medicare Other | Admitting: Internal Medicine

## 2011-04-12 VITALS — BP 100/68 | HR 98 | Temp 97.3°F | Wt 156.0 lb

## 2011-04-12 DIAGNOSIS — I10 Essential (primary) hypertension: Secondary | ICD-10-CM

## 2011-04-12 DIAGNOSIS — R0789 Other chest pain: Secondary | ICD-10-CM

## 2011-04-12 DIAGNOSIS — F068 Other specified mental disorders due to known physiological condition: Secondary | ICD-10-CM

## 2011-04-12 DIAGNOSIS — I2510 Atherosclerotic heart disease of native coronary artery without angina pectoris: Secondary | ICD-10-CM

## 2011-04-12 NOTE — Patient Instructions (Signed)
Amnesia - not much different from previous episodes that we worked up with a final diagnosis of dementia related amnesia. Plan - increase aricept to 23mg  daily.  Pain - seems more GI in nature. EKG with no evidence of angina. There are many premature beats - which do not cause symptoms. Plan - over the counter zantac or pepcid, etc (generics are fine) taken twice a day.  Leg pain/weakness - normal strength on exam. The problem seems to be the knee(s) giving out. Plan - if this gets worse - orthopedic consult.  PHlegm - Mucinex or Mucinex DM should help a lot. White lightening - no opinion.

## 2011-04-12 NOTE — Progress Notes (Signed)
  Subjective:    Patient ID: [name] Elliott, male    DOB: Nov 08, 1927, 77 y.o.   MRN: [id number]  HPI [name] Elliott presents with his daughter. The problem is memory loss: he does not have any recollection of events Monday or Tuesday. He does have some recall for Wednesday - but his daughter says that although he did call her it is probably due to having phone numbers posted by the phone.  He denies any loss of consciousness. He does not recall any febrile illness, no nausea or vomiting. This amnesia is similar to an episode earlier in the year for which he was hospitalized for extensive evaluation and neurology evaluation with a diagnosis of dementia related amnesia.  He has c/o of epigastric pain, chest pain. He is a poor historian. He cannot relate if he has exertional discomfort or if the episode of diaphoresis Tuesday night that he does recall was associated with chest pain. He usually does all his daily chores except for Monday and Tuesday. He states he does not think it is his heart that is the cause of his symptoms.  He is having weakness in both legs but left is worse.   I have reviewed the patient's medical history in detail and updated the computerized patient record.    Review of Systems System review is negative for any constitutional, cardiac, pulmonary, GI or neuro symptoms or complaints     Objective:   Physical Exam Vitals reveiwed Gen'l - energetic white male who looks his stated age of 37 HEENT - [place]/AT, C&S clear Chest - CTAP Cor - 2+ radial pulse, RRR Neuro - A&O x 3, CN II-XII normal. Mental testing not done but he is oriented to person, place, examiner.       Assessment & Plan:

## 2011-04-14 NOTE — Assessment & Plan Note (Signed)
Chest discomfort is atypical and sounds more c/w GI discomfort. He has been able to do all his usual activities with no exertional discomfort.  Plan - carefull monitoring for any exertional or at rest angina.            Routine follow-up by cardiology.

## 2011-04-14 NOTE — Assessment & Plan Note (Signed)
BP Readings from Last 3 Encounters:  04/12/11 100/68  12/27/10 134/70  12/11/10 130/80   Good control

## 2011-04-14 NOTE — Assessment & Plan Note (Signed)
Recurrent episode of amnesia that is similar to previous episode. Repeat evaluation not indicated.  Plan - increase aricept to 23 mg daily

## 2011-04-18 ENCOUNTER — Other Ambulatory Visit: Payer: Self-pay | Admitting: Orthopaedic Surgery

## 2011-04-18 ENCOUNTER — Telehealth: Payer: Self-pay

## 2011-04-18 DIAGNOSIS — M25511 Pain in right shoulder: Secondary | ICD-10-CM

## 2011-04-18 NOTE — Telephone Encounter (Signed)
Left mess to call office back.   

## 2011-04-18 NOTE — Telephone Encounter (Signed)
Patient daughter called triage lmovm requesting a call back about medication given at appt last week.

## 2011-04-19 NOTE — Telephone Encounter (Signed)
1. There is not a cheaper alternative. The cash price is 300+ per month- a $30  Co-pay is pretty good. 2. Continue the mucinex , add generic claritin 3. Stop zantac and try Prilosec otc 20mg  every AM

## 2011-04-19 NOTE — Telephone Encounter (Signed)
Left message to call back office.

## 2011-04-19 NOTE — Telephone Encounter (Signed)
Pt went to pharm and states the aricept 23 mg is $30 dollars. Pt does not want to pay this and states he needs a cheaper alternative Pt states he Mucinex is not helping with phlem Pt states generic zantac is not helping at all.  What do you advise for pt. Should he come in for another office visit? Please advise

## 2011-04-20 NOTE — Telephone Encounter (Signed)
Left detailed message on daughters am

## 2011-04-23 ENCOUNTER — Ambulatory Visit: Payer: Medicare Other | Admitting: Internal Medicine

## 2011-04-23 ENCOUNTER — Ambulatory Visit
Admission: RE | Admit: 2011-04-23 | Discharge: 2011-04-23 | Disposition: A | Discharge status: Home / Self Care | Payer: Medicare Other | Source: Ambulatory Visit | Attending: Orthopaedic Surgery | Admitting: Orthopaedic Surgery

## 2011-04-23 DIAGNOSIS — M25511 Pain in right shoulder: Secondary | ICD-10-CM

## 2011-04-23 MED ORDER — IOHEXOL 180 MG/ML  SOLN
9.0000 mL | Freq: Once | INTRAMUSCULAR | Status: AC | PRN
  Administered 2011-04-23: 9 mL via INTRA_ARTICULAR

## 2011-04-25 ENCOUNTER — Ambulatory Visit (INDEPENDENT_AMBULATORY_CARE_PROVIDER_SITE_OTHER): Payer: Medicare Other | Admitting: Internal Medicine

## 2011-04-25 ENCOUNTER — Ambulatory Visit (INDEPENDENT_AMBULATORY_CARE_PROVIDER_SITE_OTHER)
Admission: RE | Admit: 2011-04-25 | Discharge: 2011-04-25 | Disposition: A | Discharge status: Home / Self Care | Payer: Medicare Other | Source: Ambulatory Visit | Attending: Internal Medicine | Admitting: Internal Medicine

## 2011-04-25 VITALS — BP 118/62 | HR 82 | Temp 97.5°F | Wt 158.0 lb

## 2011-04-25 DIAGNOSIS — M549 Dorsalgia, unspecified: Secondary | ICD-10-CM

## 2011-04-25 NOTE — Patient Instructions (Addendum)
Chronic cough - no evidence of infection. NO indication for antibiotics. Plan - claritin (generic) once a day, robitussin DM (generic) 1 tsp four (4) times a day as needed for cough.  Chest wall pain - located at thoracic spine with tenderness to palpation. Concern for compression fracture of spine vs muscle strain. Plan - x-ray of the back and chest. Tylenol for pain. Local heat patch for pain.  Stomach and bloating - may be gastric irritation. Please continue the prilosec every morning. If the pain in the upper abdomen continues will need to consider additional lab work and possibly a referral to a Solicitor.

## 2011-04-26 ENCOUNTER — Telehealth: Payer: Self-pay | Admitting: *Deleted

## 2011-04-26 DIAGNOSIS — IMO0002 Reserved for concepts with insufficient information to code with codable children: Secondary | ICD-10-CM

## 2011-04-26 NOTE — Telephone Encounter (Signed)
[name] called - Patient informed result of xray.

## 2011-04-26 NOTE — Progress Notes (Signed)
  Subjective:    Patient ID: [name] Elliott, male    DOB: 1928/02/26, 77 y.o.   MRN: [id number]  HPI Mr. Lubrano presents for pain in the chest with cough and deep inspiration. He has had no fever or chills, no increased SOB. He has no sputum production. He did have a fall 10-14 days ago and has been having pain since. No other c/o.  I have reviewed the patient's medical history in detail and updated the computerized patient record.    Review of Systems Review of Systems  Constitutional:  Negative for fever, chills, activity change and unexpected weight change.  HEENT:  Positive for hearing loss.Negative for ear pain, congestion, neck stiffness and postnasal drip. Negative for sore throat or swallowing problems. Negative for dental complaints.   Eyes: Negative for vision loss or change in visual acuity.  Respiratory: Negative for chest tightness and wheezing.   Cardiovascular: Negative for chest pain and palpitation. No decreased exercise tolerance Gastrointestinal: No change in bowel habit. No bloating or gas. No reflux or indigestion Genitourinary: Negative for urgency, frequency, flank pain and difficulty urinating.  Musculoskeletal: Negative for myalgias, back pain, arthralgias and gait problem.  Neurological: Negative for dizziness, tremors, weakness and headaches.  Hematological: Negative for adenopathy.  Psychiatric/Behavioral: Negative for behavioral problems and dysphoric mood.       Objective:   Physical Exam Vitals reviewed - normal Gen'l- elderly white man in no distress HEENT - normal Chest - point tenderness at about T10 right of midline Lungs clear to A&P  Cor - RRR  CHEST - 2 VIEW  Comparison: 09/13/2010  Findings: Heart size and mediastinal contours are normal.  There is no pleural effusion or pulmonary edema identified.  There is no airspace consolidation identified.  Coarsened interstitial markings are noted bilaterally.  The patient is status post left  shoulder arthroplasty.  T11 compression fracture appears similar to previous exam.  IMPRESSION:  1. Chronic interstitial coarsening.  2. No acute cardiopulmonary abnormalities.  Original Report Authenticated By: Rosealee Albee, M.D.      Clinical Data: 77 year old male with chest pain, cough, T10-T11  level pain to the right of midline, query new compression fracture.  THORACIC SPINE - 2 VIEW  Comparison: Chest radiograph 09/13/2010.  Findings: Chronic severe T11 compression fracture is not  significantly changed. Mild S-shaped thoracolumbar scoliosis  appears stable. Moderate T4 and T5 compression fractures also  suspected. The T4 level appears stable, the T5 level appears to  have lost height. The remaining thoracic levels appear stable.  Postoperative changes to the proximal left humerus. Grossly stable  visualized thoracic visceral contours.  IMPRESSION:  1. Suspect mild to moderate T5 compression fracture since January  this year.  2. Severe T11 compression fracture and moderate T4 compression  fracture are stable.  Original Report Authenticated By: Harley Hallmark, M.D.         Assessment & Plan:  Acute chest wall pain - it may be due to a new T5 compression fracture.  Plan - APAP for pain           Tincture of time.

## 2011-04-26 NOTE — Telephone Encounter (Signed)
Called [name] with report of T5 compression fracture Rx for DXA scan

## 2011-04-27 ENCOUNTER — Telehealth: Payer: Self-pay

## 2011-04-27 MED ORDER — TRAMADOL HCL 50 MG PO TABS: 50.0000 mg | ORAL_TABLET | Freq: Three times a day (TID) | ORAL | Status: DC | PRN

## 2011-04-27 NOTE — Telephone Encounter (Signed)
Ok for tramadol 50mg  1-2 po q8 for pain, #60, 2 refills

## 2011-04-27 NOTE — Telephone Encounter (Signed)
Patient daughter called lm on triage VM requesting results of most recent xray results. She is also requesting something for pain to be sent in to [facility].

## 2011-04-27 NOTE — Telephone Encounter (Signed)
RX sent to pharmacy via e-script, daughter notified

## 2011-04-30 ENCOUNTER — Telehealth: Payer: Self-pay | Admitting: *Deleted

## 2011-04-30 NOTE — Telephone Encounter (Signed)
Spoke w/daughter. Pt c/o fatigue, decreased food intake and says to daughter that his head is "hot". No fever per daughter, scheduled for OV tomorrow at 1.

## 2011-05-01 ENCOUNTER — Ambulatory Visit: Payer: Medicare Other | Admitting: Internal Medicine

## 2011-05-14 ENCOUNTER — Encounter: Payer: Self-pay | Admitting: Nurse Practitioner

## 2011-05-14 ENCOUNTER — Other Ambulatory Visit: Payer: Self-pay | Admitting: Cardiology

## 2011-05-14 ENCOUNTER — Telehealth: Payer: Self-pay | Admitting: Cardiovascular Disease

## 2011-05-14 NOTE — Telephone Encounter (Signed)
Daughter called stating that her father had chest pain about 1 wk ago that went into his (L) arm. Took NTG w/relief. Has not had any since "that she knows about". Saw Dr. Debby Bud the first of August and had EKG that they were told was normal. Did not take him to ER at time of chest pain. Wanted him to be seen soon. Scheduled w/Lori for tomorrow 9/11.

## 2011-05-14 NOTE — Telephone Encounter (Signed)
lm

## 2011-05-14 NOTE — Telephone Encounter (Signed)
Called concerned because her father had complained about some chest pain right below his heart on his left side and some left arm pain early last week. He took one of his nitro glyceride tablets and the pain subsided but came back later in the day. Please call back. I have pulled his chart.

## 2011-05-14 NOTE — Telephone Encounter (Signed)
Med refill. [name] Elliott is seeing [name] tomorrow 05/15/11. She will clarify the dosage at that time.

## 2011-05-15 ENCOUNTER — Other Ambulatory Visit: Payer: Self-pay | Admitting: *Deleted

## 2011-05-15 ENCOUNTER — Encounter: Payer: Self-pay | Admitting: Nurse Practitioner

## 2011-05-15 ENCOUNTER — Ambulatory Visit (INDEPENDENT_AMBULATORY_CARE_PROVIDER_SITE_OTHER): Payer: Medicare Other | Admitting: Nurse Practitioner

## 2011-05-15 ENCOUNTER — Telehealth: Payer: Self-pay | Admitting: Nurse Practitioner

## 2011-05-15 DIAGNOSIS — R079 Chest pain, unspecified: Secondary | ICD-10-CM

## 2011-05-15 DIAGNOSIS — I2510 Atherosclerotic heart disease of native coronary artery without angina pectoris: Secondary | ICD-10-CM

## 2011-05-15 MED ORDER — ISOSORBIDE MONONITRATE ER 60 MG PO TB24: 60.0000 mg | ORAL_TABLET | ORAL | Status: DC

## 2011-05-15 MED ORDER — FUROSEMIDE 40 MG PO TABS: 40.0000 mg | ORAL_TABLET | Freq: Two times a day (BID) | ORAL | Status: DC

## 2011-05-15 MED ORDER — FUROSEMIDE 40 MG PO TABS: 40.0000 mg | ORAL_TABLET | Freq: Every day | ORAL | Status: DC

## 2011-05-15 MED ORDER — NITROGLYCERIN 0.4 MG SL SUBL: 0.4000 mg | SUBLINGUAL_TABLET | SUBLINGUAL | Status: DC | PRN

## 2011-05-15 NOTE — Telephone Encounter (Signed)
They have a question about 2 Lasix prescriptions that were received by e script.

## 2011-05-15 NOTE — Progress Notes (Signed)
[name] Elliott Date of Birth: 12/28/1927   History of Present Illness: [name] Elliott is seen today for a work in visit. He is seen for Dr. Elease Hashimoto. He has had chest pain. He is not really able to give a good history. His daughter provides most of the history. He says he has felt bad for a long time. He has some pain under his left breast. Not exertional in nature, but he is not active at all. He has chronic shoulder pain and that makes it difficult to discern. He is on chronic narcotics. He does have known CAD and has been managed medically. He has not had recent stress testing. Last cath in 2006. He tried to use some NTG but it is old and he swallowed the pill. He has some dementia and has apparently been having spells of amnesia that have been worked up and felt to be from his dementia.   Current Outpatient Prescriptions on File Prior to Visit  Medication Sig Dispense Refill  . aspirin 81 MG EC tablet Take 81 mg by mouth daily.        Marland Kitchen docusate sodium (COLACE) 100 MG capsule Take 100 mg by mouth daily. While on narcotics      . gabapentin (NEURONTIN) 300 MG capsule Take 1 capsule (300 mg total) by mouth at bedtime.  30 capsule  3  . omeprazole (PRILOSEC) 20 MG capsule Take 40 mg by mouth daily.        . potassium chloride SA (K-DUR,KLOR-CON) 20 MEQ tablet Take 1 tablet (20 mEq total) by mouth 2 (two) times daily.  90 tablet  1  . temazepam (RESTORIL) 15 MG capsule Take 15 mg by mouth at bedtime as needed.        Marland Kitchen DISCONTD: isosorbide mononitrate (IMDUR) 30 MG 24 hr tablet Take 30 mg by mouth daily.        Marland Kitchen DISCONTD: nitroGLYCERIN (NITROSTAT) 0.3 MG SL tablet Place 0.3 mg under the tongue as needed.        Marland Kitchen HYDROcodone-acetaminophen (NORCO) 5-325 MG per tablet Take 2 tablets by mouth every 6 (six) hours as needed for pain.  90 tablet  3  . traMADol (ULTRAM) 50 MG tablet Take 1 tablet (50 mg total) by mouth every 8 (eight) hours as needed for pain.  60 tablet  2  . DISCONTD: donepezil (ARICEPT) 10  MG tablet Take 1 tablet (10 mg total) by mouth daily.  30 tablet  5    Allergies  Allergen Reactions  . Crestor (Rosuvastatin Calcium)     Intolerance,  Back pain    Past Medical History  Diagnosis Date  . Constipation, chronic   . Sebaceous cyst   . Other malaise and fatigue   . Pain in joint, shoulder region     Has chronic shoulder pain  . Persistent disorder of initiating or maintaining sleep   . Pain in limb   . Thrombocytopenia, unspecified   . Dementia     with periods of amnesia  . Other B-complex deficiencies   . Hypothyroidism   . Pain in limb   . Esophageal reflux   . CAD (coronary artery disease)     last cath in 2006. Managed medically  . Unspecified essential hypertension   . Arthropathy, unspecified, site unspecified   . Pure hypercholesterolemia   . Pneumonia 2011  . Orthostasis     Past Surgical History  Procedure Date  . Hand surgery     Right crush injury,  right fifth digit contracture, limited  motion  . Lumbar spine surgery     2007, Dr Eloise Harman (Neurosurgeon)  . Knee surgery     age 77, x2  . Cataract extraction 2009  . Rotator cuff repair 75.30.2011  . US echocardiography 02-28-2010    Est EF 50-55%    History  Smoking status  . Never Smoker   Smokeless tobacco  . Not on file    History  Alcohol Use No    Family History  Problem Relation Age of Onset  . Breast cancer Mother   . Cancer Brother     Lung  . Diabetes Daughter     Review of Systems: The review of systems is positive for dementia. Chest pain as above. No real shortness of breath.  EKG at his PCP was normal. He has chronic PVC's.  All other systems were reviewed and are negative.  Physical Exam: BP 160/80  Pulse 62  Ht 5\' 7"  (1.702 m)  Wt 158 lb 12.8 oz (72.031 kg)  BMI 24.87 kg/m2 Patient is pleasant and in no acute distress. He is a poor historian. Skin is warm and dry. Color is normal.  HEENT is unremarkable. Normocephalic/atraumatic. PERRL. Sclera are  nonicteric. Neck is supple. No masses. No JVD. Lungs are clear. Cardiac exam shows a regular rate and rhythm. He has some ectopics. Abdomen is soft. Extremities are without edema. Gait and ROM are intact. No gross neurologic deficits noted.   LABORATORY DATA:   Assessment / Plan:

## 2011-05-15 NOTE — Telephone Encounter (Signed)
[facility] called to verify on dose of Lasix; advised is 40 mg daily.

## 2011-05-15 NOTE — Patient Instructions (Signed)
We are going to arrange for a stress test to see what is causing your chest pain Increase the Imdur to 60 mg each day (You may take two of your 30 mg tablets and use those up.) New prescription has been sent to the drug store. Keep your appointment with Dr. Elease Hashimoto in October Use your NTG under your tongue for recurrent chest pain. May take one tablet every 5 minutes. If you are still having discomfort after 3 tablets in 15 minutes, call 911. I have sent a prescription to the drug store.

## 2011-05-15 NOTE — Assessment & Plan Note (Signed)
I have increased his Imdur to 60 mg. Refilled his NTG. Reminded him how to use NTG sl. Will update his stress test, mainly to risk stratify him. He is to keep his appointment with Dr. Elease Hashimoto in October. Patient is agreeable to this plan and will call if any problems develop in the interim.

## 2011-05-16 ENCOUNTER — Telehealth: Payer: Self-pay | Admitting: *Deleted

## 2011-05-16 ENCOUNTER — Ambulatory Visit (INDEPENDENT_AMBULATORY_CARE_PROVIDER_SITE_OTHER)
Admission: RE | Admit: 2011-05-16 | Discharge: 2011-05-16 | Disposition: A | Discharge status: Home / Self Care | Payer: Medicare Other | Source: Ambulatory Visit | Attending: Internal Medicine | Admitting: Internal Medicine

## 2011-05-16 DIAGNOSIS — M4850XA Collapsed vertebra, not elsewhere classified, site unspecified, initial encounter for fracture: Secondary | ICD-10-CM

## 2011-05-16 DIAGNOSIS — Z13820 Encounter for screening for osteoporosis: Secondary | ICD-10-CM

## 2011-05-16 DIAGNOSIS — IMO0002 Reserved for concepts with insufficient information to code with codable children: Secondary | ICD-10-CM

## 2011-05-16 NOTE — Telephone Encounter (Signed)
Additional order needed for dexa

## 2011-05-21 ENCOUNTER — Other Ambulatory Visit (HOSPITAL_COMMUNITY): Payer: Medicare Other | Admitting: Radiology

## 2011-05-22 ENCOUNTER — Encounter: Payer: Self-pay | Admitting: *Deleted

## 2011-05-28 ENCOUNTER — Ambulatory Visit (HOSPITAL_COMMUNITY): Payer: Medicare Other | Attending: Cardiovascular Disease | Admitting: Radiology

## 2011-05-28 DIAGNOSIS — I2510 Atherosclerotic heart disease of native coronary artery without angina pectoris: Secondary | ICD-10-CM

## 2011-05-28 DIAGNOSIS — I4949 Other premature depolarization: Secondary | ICD-10-CM

## 2011-05-28 DIAGNOSIS — R079 Chest pain, unspecified: Secondary | ICD-10-CM

## 2011-05-28 MED ORDER — TECHNETIUM TC 99M TETROFOSMIN IV KIT
33.0000 | PACK | Freq: Once | INTRAVENOUS | Status: AC | PRN
  Administered 2011-05-28: 33 via INTRAVENOUS

## 2011-05-28 MED ORDER — TECHNETIUM TC 99M TETROFOSMIN IV KIT
11.0000 | PACK | Freq: Once | INTRAVENOUS | Status: AC | PRN
  Administered 2011-05-28: 11 via INTRAVENOUS

## 2011-05-28 MED ORDER — REGADENOSON 0.4 MG/5ML IV SOLN
0.4000 mg | Freq: Once | INTRAVENOUS | Status: AC
  Administered 2011-05-28: 0.4 mg via INTRAVENOUS

## 2011-05-28 NOTE — Progress Notes (Signed)
[facility] SITE 3 NUCLEAR MED [address] [place] [address] [phone]  Cardiology Nuclear Med Study  [name] Elliott is a 77 y.o. male [id number] 09-16-1927   Nuclear Med Background Indication for Stress Test:  Evaluation for Ischemia and PTCA Patency History: '06 Angioplasty:LAD, 06/11 Echo: EF 50-55%, '06 Heart Catheterization: EF 50%, 40%-CFX, 70%-LAD, '07 Myocardial Perfusion Study: EF 61% abn. Small area of ischemia in [place] RX Cardiac Risk Factors: Hypertension and Lipids  Symptoms:  Chest Pain and Palpitations   Nuclear Pre-Procedure Caffeine/Decaff Intake:  None NPO After: 11:00pm   Lungs:  clear IV 0.9% NS with Angio Cath:  20g  IV Site: R Forearm  IV Started by:  Cathlyn Parsons, RN  Chest Size (in):  40 Cup Size: n/a  Height: 5\' 7"  (1.702 m)  Weight:  157 lb (71.215 kg)  BMI:  Body mass index is 24.59 kg/(m^2). Tech Comments:  This patient was switched from maybe walking Lexiscan to a sitting Lexiscan. Walking down the hall he was unsteady on his feet.    Nuclear Med Study 1 or 2 day study: 1 day  Stress Test Type:  Eugenie Birks  Reading MD: Charlton Haws, MD  Order Authorizing Provider:  P.Nahser/L.Gerhardt  Resting Radionuclide: Technetium 45m Tetrofosmin  Resting Radionuclide Dose: 11 mCi   Stress Radionuclide:  Technetium 17m Tetrofosmin  Stress Radionuclide Dose: 33 mCi           Stress Protocol Rest HR: 57 Stress HR: 68  Rest BP: 137/82 Stress BP: 139/84  Exercise Time (min): n/a METS: n/a   Predicted Max HR: 140 bpm % Max HR: 48.57 bpm Rate Pressure Product: 9452   Dose of Adenosine (mg):  n/a Dose of Lexiscan: 0.4 mg  Dose of Atropine (mg): n/a Dose of Dobutamine: n/a mcg/kg/min (at max HR)  Stress Test Technologist: Milana Na, EMT-P  Nuclear Technologist:  Domenic Polite, CNMT     Rest Procedure:  Myocardial perfusion imaging was performed at rest 45 minutes following the intravenous administration of  Technetium 76m Tetrofosmin. Rest ECG: Sinus Bradycardia PVCS  Stress Procedure:  The patient received IV Lexiscan 0.4 mg over 15-seconds.  Technetium 45m Tetrofosmin injected at 30-seconds.  There were no significant changes and occ multifocal PVCS with Lexiscan.  Quantitative spect images were obtained after a 45 minute delay. Stress ECG: No significant change from baseline ECG  QPS Raw Data Images:  Patient motion noted. Stress Images:  There is decreased uptake in the apex. Rest Images:  Normal homogeneous uptake in all areas of the myocardium. Subtraction (SDS):  These findings are consistent with ischemia. Transient Ischemic Dilatation (Normal <1.22):  1.05 Lung/Heart Ratio (Normal <0.45):  .32  Quantitative Gated Spect Images QGS EDV:  124 ml QGS ESV:  62 ml QGS cine images:  Apical hypokinesis QGS EF: 50%  Impression Exercise Capacity:  Lexiscan with no exercise. BP Response:  Normal blood pressure response. Clinical Symptoms:  No chest pain. ECG Impression:  No significant ST segment change suggestive of ischemia. Comparison with Prior Nuclear Study: No images to compare  Overall Impression:  Small area of apical ischemia EF 50%     Charlton Haws

## 2011-05-30 NOTE — Progress Notes (Signed)
advise

## 2011-05-31 ENCOUNTER — Telehealth: Payer: Self-pay | Admitting: *Deleted

## 2011-05-31 ENCOUNTER — Telehealth: Payer: Self-pay | Admitting: Cardiovascular Disease

## 2011-05-31 LAB — BASIC METABOLIC PANEL
BUN: 9
CO2: 26
Calcium: 9.1
Chloride: 105
Creatinine, Ser: 1.14
GFR calc Af Amer: >60
GFR calc non Af Amer: >60
Glucose, Bld: 96
Potassium: 3.7
Sodium: 140

## 2011-05-31 LAB — POCT HEMOGLOBIN-HEMACUE: Hemoglobin: 14.7

## 2011-05-31 MED ORDER — NITROGLYCERIN 0.4 MG SL SUBL: 0.4000 mg | SUBLINGUAL_TABLET | SUBLINGUAL | Status: DC | PRN

## 2011-05-31 NOTE — Telephone Encounter (Signed)
Spoke with daughter/poa. She is to give nitro next time he has chest / shoulder pain. She states it is difficult to tell when he is having chest pain due to left shoulder was crushed and has partial replacement. Taught how to give nitro and explained side effects to watch for. They are to call with increased pain. Daughter verbalized understanding.

## 2011-05-31 NOTE — Telephone Encounter (Signed)
Pt's daughter called saying someone called [name] Elliott with tests results He has dementia and does not understand. Please call her.

## 2011-05-31 NOTE — Telephone Encounter (Signed)
Ok, done 

## 2011-05-31 NOTE — Progress Notes (Signed)
msg left that he should try nitro next time he has left shoulder pain. i will make contact later to explain.

## 2011-05-31 NOTE — Telephone Encounter (Signed)
Pt not using nitro, "dont use it, don't have any". Script filled, left msg to use when he has shoulder pain to see if there is any effect with his pain, will call him back to talk in person.

## 2011-06-04 ENCOUNTER — Encounter: Payer: Self-pay | Admitting: Internal Medicine

## 2011-06-04 MED ORDER — ALENDRONATE SODIUM 70 MG PO TABS: 70.0000 mg | ORAL_TABLET | ORAL | Status: DC

## 2011-06-06 ENCOUNTER — Telehealth: Payer: Self-pay | Admitting: *Deleted

## 2011-06-06 NOTE — Telephone Encounter (Signed)
Spoke w/[name] -   1. Patient requesting result of bone density. 2. Pt has gotten no relief from tramadol and/or hydrocodone. He is req advisement from MD for his pain.

## 2011-06-06 NOTE — Telephone Encounter (Signed)
Letter done Oct 1. Osteoporosis both hips, treatment recommended and Rx sent in.  For pain - continue tramadol. Add Gabapentin 300mg  qhs x 3 days, then bid x 7 days, then tid. # 90 refill x 5

## 2011-06-07 NOTE — Telephone Encounter (Signed)
Informed pts daughter. She states [name] Elliott has been on Gabapentin before and she states "it made him feel bad". She states he has some of this medication at home and she will see if she can get him to take it and call back with an update. All information given to pts daughter

## 2011-06-07 NOTE — Telephone Encounter (Signed)
thanks

## 2011-06-20 ENCOUNTER — Ambulatory Visit (INDEPENDENT_AMBULATORY_CARE_PROVIDER_SITE_OTHER): Payer: Medicare Other | Admitting: *Deleted

## 2011-06-20 DIAGNOSIS — Z23 Encounter for immunization: Secondary | ICD-10-CM

## 2011-06-29 ENCOUNTER — Telehealth: Payer: Self-pay

## 2011-06-29 NOTE — Telephone Encounter (Signed)
Done hardcopy to robin  

## 2011-06-29 NOTE — Telephone Encounter (Signed)
[name] called requesting Rx for Zostavax for pt to pharmacy on file.

## 2011-06-29 NOTE — Telephone Encounter (Signed)
Called the patient informed his daughter prescription requested has been faxed as requested to [facility] [phone]

## 2011-07-03 ENCOUNTER — Encounter: Payer: Self-pay | Admitting: Cardiovascular Disease

## 2011-07-03 ENCOUNTER — Ambulatory Visit (INDEPENDENT_AMBULATORY_CARE_PROVIDER_SITE_OTHER): Payer: Medicare Other | Admitting: Cardiovascular Disease

## 2011-07-03 VITALS — BP 155/82 | HR 77 | Ht 66.5 in | Wt 164.4 lb

## 2011-07-03 DIAGNOSIS — I2510 Atherosclerotic heart disease of native coronary artery without angina pectoris: Secondary | ICD-10-CM

## 2011-07-03 NOTE — Assessment & Plan Note (Signed)
He has known moderate coronary artery disease.  His Myoview study is unchanged from his previous Myoview study in 2007.  I would like for him to try taking nitroglycerin whenever he gets this "sick feeling". If nitroglycerin tends to help, will return for repeat cardiac catheterization. Otherwise I've asked him to see his general medical Dr. or perhaps a gastroenterologist.

## 2011-07-03 NOTE — Patient Instructions (Signed)
Your physician wants you to follow-up in 1 month You will receive a reminder letter in the mail two months in advance. If you don't receive a letter, please call our office to schedule the follow-up appointment.

## 2011-07-03 NOTE — Progress Notes (Signed)
[name] Elliott Date of Birth  1928/07/12 [facility] [address]    [address] [place], [place]  [address] [phone]  Fax  [phone]  History of Present Illness:  77 year old gentleman with a history of moderate diffuse coronary artery disease. We treated medically. The left anterior descending arteries/ 1st diagonal  stenosis has not was not suitable for PCI.  He complains of being sick in general is weak sensation. He also complains of some left arm pain. His left arm pain typically gets better with nitroglycerin. He's not sure if his "sickness" gets better with nitroglycerin. He does not get any exercise. He has not been to see his medical doctor on regular basis.  It Is very difficult to get a clear description of his chest pain.  He rambles and  changes the subject quite frequently.  Current Outpatient Prescriptions on File Prior to Visit  Medication Sig Dispense Refill  . alendronate (FOSAMAX) 70 MG tablet Take 1 tablet (70 mg total) by mouth every 7 (seven) days. Take with a full glass of water on an empty stomach.  4 tablet  11  . aspirin 81 MG EC tablet Take 81 mg by mouth daily.        Marland Kitchen docusate sodium (COLACE) 100 MG capsule Take 100 mg by mouth daily. While on narcotics      . donepezil (ARICEPT) 23 MG TABS tablet Take 23 mg by mouth at bedtime.        . furosemide (LASIX) 40 MG tablet Take 1 tablet (40 mg total) by mouth daily.  60 tablet  11  . gabapentin (NEURONTIN) 300 MG capsule Take 1 capsule (300 mg total) by mouth at bedtime.  30 capsule  3  . isosorbide mononitrate (IMDUR) 60 MG 24 hr tablet Take 1 tablet (60 mg total) by mouth every morning.  30 tablet  11  . nitroGLYCERIN (NITROSTAT) 0.4 MG SL tablet Place 1 tablet (0.4 mg total) under the tongue every 5 (five) minutes as needed for chest pain.  25 tablet  3  . omeprazole (PRILOSEC) 20 MG capsule Take 40 mg by mouth daily.        Marland Kitchen DISCONTD: potassium chloride SA (K-DUR,KLOR-CON) 20 MEQ tablet Take 1  tablet (20 mEq total) by mouth 2 (two) times daily.  90 tablet  1  . HYDROcodone-acetaminophen (NORCO) 5-325 MG per tablet Take 2 tablets by mouth every 6 (six) hours as needed for pain.  90 tablet  3  . traMADol (ULTRAM) 50 MG tablet Take 1 tablet (50 mg total) by mouth every 8 (eight) hours as needed for pain.  60 tablet  2  . DISCONTD: HYDROcodone-acetaminophen (NORCO) 5-325 MG per tablet Take 1 tablet by mouth every 6 (six) hours as needed.        Marland Kitchen DISCONTD: traMADol (ULTRAM) 50 MG tablet Take 50 mg by mouth every 6 (six) hours as needed.          Allergies  Allergen Reactions  . Crestor (Rosuvastatin Calcium)     Intolerance,  Back pain    Past Medical History  Diagnosis Date  . Constipation, chronic   . Sebaceous cyst   . Other malaise and fatigue   . Pain in joint, shoulder region     Has chronic shoulder pain  . Persistent disorder of initiating or maintaining sleep   . Pain in limb   . Thrombocytopenia, unspecified   . Dementia     with periods of amnesia  . Other  B-complex deficiencies   . Hypothyroidism   . Pain in limb   . Esophageal reflux   . CAD (coronary artery disease)     last cath in 2006. Managed medically  . Unspecified essential hypertension   . Arthropathy, unspecified, site unspecified   . Pure hypercholesterolemia   . Pneumonia 2011  . Orthostasis   . PVC's (premature ventricular contractions)     Past Surgical History  Procedure Date  . Hand surgery     Right crush injury, right fifth digit contracture, limited  motion  . Lumbar spine surgery     2007, Dr Eloise Harman (Neurosurgeon)  . Knee surgery     age 39, x2  . Cataract extraction 2009  . Rotator cuff repair 8.30.2011  . US echocardiography 02-28-2010    Est EF 50-55%    History  Smoking status  . Never Smoker   Smokeless tobacco  . Not on file    History  Alcohol Use No    Family History  Problem Relation Age of Onset  . Breast cancer Mother   . Cancer Brother     Lung    . Diabetes Daughter     Reviw of Systems:  Reviewed in the HPI.  All other systems are negative.  Physical Exam: BP 155/82  Pulse 77  Ht 5' 6.5" (1.689 m)  Wt 164 lb 6.4 oz (74.571 kg)  BMI 26.14 kg/m2 The patient is alert and oriented x 3.  The mood and affect are normal.   Skin: warm and dry.  Color is normal.    HEENT:   His neck is supple. His carotids are 2+. There is no JVD  Lungs: His lungs are clear.   Heart: Regular rate, S1-S2.    Abdomen: His abdomen is soft gait is good bowel sounds. There is no hepatosplenomegaly.  Extremities:  He has no clubbing cyanosis or edema  Neuro:  His gait is a bit unsteady. He tends to shuffle his feet. His motor and sensory are basically intact.     ECG: Normal sinus rhythm with frequent premature ventricular contractions in a bigeminal pattern. Has an incomplete right bundle branch block  Assessment / Plan:

## 2011-07-10 ENCOUNTER — Telehealth: Payer: Self-pay | Admitting: *Deleted

## 2011-07-10 NOTE — Telephone Encounter (Signed)
Chart opened in error

## 2011-07-16 ENCOUNTER — Ambulatory Visit (INDEPENDENT_AMBULATORY_CARE_PROVIDER_SITE_OTHER): Payer: Medicare Other | Admitting: Internal Medicine

## 2011-07-16 DIAGNOSIS — J111 Influenza due to unidentified influenza virus with other respiratory manifestations: Secondary | ICD-10-CM

## 2011-07-16 MED ORDER — OSELTAMIVIR PHOSPHATE 75 MG PO CAPS: 75.0000 mg | ORAL_CAPSULE | Freq: Two times a day (BID) | ORAL | Status: AC

## 2011-07-16 MED ORDER — AZITHROMYCIN 250 MG PO TABS: ORAL_TABLET | ORAL | Status: AC

## 2011-07-16 NOTE — Progress Notes (Signed)
  Subjective:    Patient ID: [name] Elliott, male    DOB: 1928-07-20, 77 y.o.   MRN: [id number]  HPI Mr. Vanover presents with a week long history of diffuse myalgias, cough with some sputum production, feeling feverish, weakness. No diarrhea, no urinary track symptoms. He has not had severe shortness of breath. He did have a nose bleed that may not be related. He has been able to take some nourishment.   I have reviewed the patient's medical history in detail and updated the computerized patient record.    Review of Systems System review is negative for any constitutional, cardiac, pulmonary, GI or neuro symptoms or complaints other than as described in the HPI.     Objective:   Physical Exam Vitals noted - stable BP Gen'l- WNWD elderly white male in no acute distress HEENT- [place]/AT, no sinus tenderness Cor- RRR Pulm- good breath sounds, no wheezing Abd- BS +, no guarding or rebound.        Assessment & Plan:  Influenza based on symptoms and lack of signs of true respiratory illness  Plan - Tamiflu not recommended since he is greater than 72 hours ou.t from onset of symptoms           Z-pak as directed to cover for any potential bacterial infection          Supportive care.

## 2011-07-16 NOTE — Patient Instructions (Signed)
Possible influenza - severity limited by having had the flu shot. Plan - tamiflu twice a day for 5 days. To be sure we don't leave an infection behind - a Z-pak as directed. For cough Robitussin DM or the generic equivalent, 1 tsp every 4 hours. Lots of fluids, clear soups, tylenol.  Medication expense - check [employer] for assistance programs while he is in the donut hole.  Leg cramps - continue the gabapentin. Try taking Ginko Biloba - an herbal product that has been reported to help the leg cramps.  Swelling in the ankles - keep your legs elevated and consider wearing support hose.   Influenza, Adult Influenza (flu) is an infection caused by a germ. It starts suddenly, usually with a fever. It causes chills, dry and hacking cough, headache, body aches, and sore throat. Influenza spreads easily from one person to another.   HOME CARE    Only take medicines as told by your doctor.     Rest.    Drink enough fluids to keep your pee (urine) clear or pale yellow.     Wash your hands often. Do this after you blow your nose, after you go to the bathroom, and before you touch food.  GET HELP RIGHT AWAY IF:    You have shortness of breath while resting.     You have pain or pressure in the chest or belly (abdomen).     You suddenly feel dizzy.     You feel confused.     You have a hard time breathing.     Your skin or nails turn bluish in color.     You get a bad neck pain or stiffness.     You get a bad headache, face pain, or earache.     You throw up (vomit) a lot and often.     You have a fever.  MAKE SURE YOU:    Understand these instructions.     Will watch your condition.     Will get help right away if you are not doing well or get worse.  Document Released: 05/29/2008 Document Revised: 05/02/2011 Document Reviewed: 05/29/2008 [facility] Patient Information 2012 [place], [place].

## 2011-07-30 ENCOUNTER — Telehealth: Payer: Self-pay | Admitting: Cardiovascular Disease

## 2011-07-30 ENCOUNTER — Telehealth: Payer: Self-pay | Admitting: *Deleted

## 2011-07-30 NOTE — Telephone Encounter (Signed)
PT HAS APP THIS FRIDAY

## 2011-07-30 NOTE — Telephone Encounter (Signed)
Dr Elease Hashimoto, we have a request for cardiac clearance for LTKA, will he need an appointment or can you give auth? Please advise and I can print/fax a letter.

## 2011-07-30 NOTE — Telephone Encounter (Signed)
Walk In Pt Form " pt Dropped off Surgical Clearance from Sports Medicine" sent to [facility]  07/30/11/km

## 2011-07-30 NOTE — Telephone Encounter (Signed)
He will need an apt.  He has a mildly abn. myoview and was having " sick feelings" on occasion that may be angina.  Please remind him to take the NTG whenever he has one of his " sick episodes" so that we can see if the NTG helps.

## 2011-08-01 NOTE — Telephone Encounter (Signed)
Pt was told to use nitro,

## 2011-08-03 ENCOUNTER — Other Ambulatory Visit: Payer: Self-pay

## 2011-08-03 ENCOUNTER — Encounter: Payer: Self-pay | Admitting: Cardiovascular Disease

## 2011-08-03 ENCOUNTER — Ambulatory Visit (INDEPENDENT_AMBULATORY_CARE_PROVIDER_SITE_OTHER): Payer: Medicare Other | Admitting: Cardiovascular Disease

## 2011-08-03 ENCOUNTER — Encounter: Payer: Self-pay | Admitting: *Deleted

## 2011-08-03 VITALS — BP 126/58 | HR 64 | Ht 66.0 in | Wt 166.0 lb

## 2011-08-03 DIAGNOSIS — I739 Peripheral vascular disease, unspecified: Secondary | ICD-10-CM

## 2011-08-03 DIAGNOSIS — I2510 Atherosclerotic heart disease of native coronary artery without angina pectoris: Secondary | ICD-10-CM

## 2011-08-03 LAB — BASIC METABOLIC PANEL
BUN: 16 mg/dL (ref 6–23)
CO2: 27 mEq/L (ref 19–32)
Calcium: 9.2 mg/dL (ref 8.4–10.5)
Creatinine, Ser: 1.1 mg/dL (ref 0.4–1.5)
Glucose, Bld: 115 mg/dL — ABNORMAL HIGH (ref 70–99)

## 2011-08-03 LAB — CBC WITH DIFFERENTIAL/PLATELET
Basophils Absolute: 0 10*3/uL (ref 0.0–0.1)
Basophils Relative: 0.6 % (ref 0.0–3.0)
HCT: 40.6 % (ref 39.0–52.0)
Hemoglobin: 13.7 g/dL (ref 13.0–17.0)
Lymphs Abs: 2 10*3/uL (ref 0.7–4.0)
Monocytes Relative: 7 % (ref 3.0–12.0)
Neutro Abs: 4.9 10*3/uL (ref 1.4–7.7)
RDW: 14.8 % — ABNORMAL HIGH (ref 11.5–14.6)

## 2011-08-03 LAB — PROTIME-INR: INR: 1 ratio (ref 0.8–1.0)

## 2011-08-03 NOTE — Patient Instructions (Signed)
Your physician recommends that you return for lab work [id number]/ BMP, CBC, PT/inr  Your physician has requested that you have a cardiac catheterization. Cardiac catheterization is used to diagnose and/or treat various heart conditions. Doctors may recommend this procedure for a number of different reasons. The most common reason is to evaluate chest pain. Chest pain can be a symptom of coronary artery disease (CAD), and cardiac catheterization can show whether plaque is narrowing or blocking your heart's arteries. This procedure is also used to evaluate the valves, as well as measure the blood flow and oxygen levels in different parts of your heart. For further information please visit [email or web address] Please follow instruction sheet, as given. 08/08/11 wed, 8:30. jv lab

## 2011-08-03 NOTE — Assessment & Plan Note (Signed)
[name] Elliott has at least a moderate degree of dementia which makes it difficult to get a history. He clearly has exertional shortness of breath and what probably is anginal equivalent. He gets a "sick feeling" anytime he walks up a hill in his backyard. He has known moderate coronary artery disease and recent Myoview study revealed anterior apical ischemia.  Long discussion with his daughter and the patient. At this point I think that we should proceed with cardiac catheterization for further evaluation. He may have some additional stenosis or may have progression of his LAD/diagonal disease.  We have discussed the risks, benefits, and options of cardiac catheterization. He and his daughter understand and agree to proceed. I think that this will be very important to do before he has any further surgical procedures.

## 2011-08-03 NOTE — Progress Notes (Signed)
[name] Elliott Date of Birth  11/04/1927 [facility] [address]    [address] [place], [place]  [address] [phone]  Fax  [phone]  History of Present Illness:  77-year-old gentleman with a history of moderate diffuse coronary artery disease. We treated medically. The left anterior descending arteries/ 1st diagonal  stenosis has not was not suitable for PCI.  He complains of being sick in general is weak sensation. He also complains of some left arm pain.   When I saw him last month. He was having some episodes of chest pain. We were trying to decide whether or not these were due to angina. I asked him to take nitroglycerin when he had these episodes of pain. At times the nitroglycerin helps him and at other times the nitroglycerin did not do anything.  When he walks up a hill in his backyard he has significant shortness breath and this "sick feeling".   He has had a partial shoulder replacement for a shoulder fracture.  He has had chronic shoulder pain since that time.  He has chronic knee pain and is considering knee surgery. His office visit was for the purpose of preoperative evaluation.    Current Outpatient Prescriptions on File Prior to Visit  Medication Sig Dispense Refill  . alendronate (FOSAMAX) 70 MG tablet Take 1 tablet (70 mg total) by mouth every 7 (seven) days. Take with a full glass of water on an empty stomach.  4 tablet  11  . aspirin 81 MG EC tablet Take 81 mg by mouth daily.        . docusate sodium (COLACE) 100 MG capsule Take 100 mg by mouth daily. While on narcotics      . donepezil (ARICEPT) 23 MG TABS tablet Take 23 mg by mouth at bedtime.        . furosemide (LASIX) 40 MG tablet Take 1 tablet (40 mg total) by mouth daily.  60 tablet  11  . gabapentin (NEURONTIN) 300 MG capsule Take 1 capsule (300 mg total) by mouth at bedtime.  30 capsule  3  . isosorbide mononitrate (IMDUR) 60 MG 24 hr tablet Take 1 tablet (60 mg total) by mouth every morning.  30  tablet  11  . nitroGLYCERIN (NITROSTAT) 0.4 MG SL tablet Place 1 tablet (0.4 mg total) under the tongue every 5 (five) minutes as needed for chest pain.  25 tablet  3  . omeprazole (PRILOSEC) 20 MG capsule Take 40 mg by mouth daily.        . potassium chloride SA (K-DUR,KLOR-CON) 20 MEQ tablet Take 20 mEq by mouth daily.        . HYDROcodone-acetaminophen (NORCO) 5-325 MG per tablet Take 2 tablets by mouth every 6 (six) hours as needed for pain.  90 tablet  3  . traMADol (ULTRAM) 50 MG tablet Take 1 tablet (50 mg total) by mouth every 8 (eight) hours as needed for pain.  60 tablet  2  . DISCONTD: potassium chloride SA (K-DUR,KLOR-CON) 20 MEQ tablet Take 1 tablet (20 mEq total) by mouth 2 (two) times daily.  90 tablet  1    Allergies  Allergen Reactions  . Crestor (Rosuvastatin Calcium)     Intolerance,  Back pain    Past Medical History  Diagnosis Date  . Constipation, chronic   . Sebaceous cyst   . Other malaise and fatigue   . Pain in joint, shoulder region     Has chronic shoulder pain  . Persistent   disorder of initiating or maintaining sleep   . Pain in limb   . Thrombocytopenia, unspecified   . Dementia     with periods of amnesia  . Other B-complex deficiencies   . Hypothyroidism   . Pain in limb   . Esophageal reflux   . CAD (coronary artery disease)     last cath in 2006. Managed medically  . Unspecified essential hypertension   . Arthropathy, unspecified, site unspecified   . Pure hypercholesterolemia   . Pneumonia 2011  . Orthostasis   . PVC's (premature ventricular contractions)     Past Surgical History  Procedure Date  . Hand surgery     Right crush injury, right fifth digit contracture, limited  motion  . Lumbar spine surgery     2007, Dr Paterson (Neurosurgeon)  . Knee surgery     age 61, x2  . Cataract extraction 2009  . Rotator cuff repair 8.30.2011  . Us echocardiography 02-28-2010    Est EF 50-55%    History  Smoking status  . Never Smoker     Smokeless tobacco  . Not on file    History  Alcohol Use No    Family History  Problem Relation Age of Onset  . Breast cancer Mother   . Cancer Brother     Lung  . Diabetes Daughter     Reviw of Systems:  Reviewed in the HPI.  All other systems are negative.  Physical Exam: BP 126/58  Pulse 64  Ht 5' 6" (1.676 m)  Wt 166 lb (75.297 kg)  BMI 26.79 kg/m2 The patient is alert and oriented x 3.  The mood and affect are normal.   Skin: warm and dry.  Color is normal.    HEENT:   His neck is supple. His carotids are 2+. There is no JVD  Lungs: His lungs are clear.   Heart: Regular rate, S1-S2.    Abdomen: His abdomen is soft gait is good bowel sounds. There is no hepatosplenomegaly.  Extremities:  He has no clubbing cyanosis or edema  Neuro:  His gait is a bit unsteady. He tends to shuffle his feet. His motor and sensory are basically intact.     ECG: Normal sinus rhythm with frequent premature ventricular contractions in a bigeminal pattern. Has an incomplete right bundle branch block  Assessment / Plan:   

## 2011-08-04 HISTORY — PX: CARDIAC CATHETERIZATION: SHX172

## 2011-08-07 ENCOUNTER — Telehealth: Payer: Self-pay | Admitting: *Deleted

## 2011-08-07 MED ORDER — POTASSIUM CHLORIDE CRYS ER 20 MEQ PO TBCR: 20.0000 meq | EXTENDED_RELEASE_TABLET | Freq: Two times a day (BID) | ORAL | Status: DC

## 2011-08-07 NOTE — Telephone Encounter (Signed)
Dr called and informed daughter.

## 2011-08-08 ENCOUNTER — Inpatient Hospital Stay (HOSPITAL_BASED_OUTPATIENT_CLINIC_OR_DEPARTMENT_OTHER)
Admission: RE | Admit: 2011-08-08 | Discharge: 2011-08-08 | Disposition: A | Discharge status: Home / Self Care | Payer: Medicare Other | Source: Ambulatory Visit | Attending: Cardiovascular Disease | Admitting: Cardiovascular Disease

## 2011-08-08 ENCOUNTER — Encounter (HOSPITAL_BASED_OUTPATIENT_CLINIC_OR_DEPARTMENT_OTHER)
Admission: RE | Disposition: A | Discharge status: Home / Self Care | Payer: Self-pay | Source: Ambulatory Visit | Attending: Cardiovascular Disease

## 2011-08-08 DIAGNOSIS — I200 Unstable angina: Secondary | ICD-10-CM | POA: Insufficient documentation

## 2011-08-08 DIAGNOSIS — I2510 Atherosclerotic heart disease of native coronary artery without angina pectoris: Secondary | ICD-10-CM | POA: Insufficient documentation

## 2011-08-08 DIAGNOSIS — Z9889 Other specified postprocedural states: Secondary | ICD-10-CM

## 2011-08-08 HISTORY — DX: Other specified postprocedural states: Z98.890

## 2011-08-08 SURGERY — JV LEFT HEART CATHETERIZATION WITH CORONARY ANGIOGRAM
Anesthesia: Moderate Sedation

## 2011-08-08 MED ORDER — ACETAMINOPHEN 325 MG PO TABS: 650.0000 mg | ORAL_TABLET | ORAL | Status: DC | PRN

## 2011-08-08 MED ORDER — SODIUM CHLORIDE 0.9 % IV SOLN
INTRAVENOUS | Status: DC
  Administered 2011-08-08: 08:00:00 via INTRAVENOUS

## 2011-08-08 MED ORDER — ONDANSETRON HCL 4 MG/2ML IJ SOLN: 4.0000 mg | Freq: Four times a day (QID) | INTRAMUSCULAR | Status: DC | PRN

## 2011-08-08 MED ORDER — DIAZEPAM 5 MG PO TABS
5.0000 mg | ORAL_TABLET | ORAL | Status: AC
  Administered 2011-08-08: 5 mg via ORAL

## 2011-08-08 MED ORDER — SODIUM CHLORIDE 0.9 % IV SOLN: 250.0000 mL | INTRAVENOUS | Status: DC | PRN

## 2011-08-08 MED ORDER — SODIUM CHLORIDE 0.9 % IV SOLN: INTRAVENOUS | Status: DC

## 2011-08-08 MED ORDER — SODIUM CHLORIDE 0.9 % IJ SOLN: 3.0000 mL | INTRAMUSCULAR | Status: DC | PRN

## 2011-08-08 MED ORDER — ASPIRIN 81 MG PO CHEW: 324.0000 mg | CHEWABLE_TABLET | ORAL | Status: DC

## 2011-08-08 MED ORDER — SODIUM CHLORIDE 0.9 % IJ SOLN: 3.0000 mL | Freq: Two times a day (BID) | INTRAMUSCULAR | Status: DC

## 2011-08-08 NOTE — Interval H&P Note (Signed)
History and Physical Interval Note:  08/08/2011 8:54 AM  [name] Elliott  has presented today for surgery, with the diagnosis of cp  The various methods of treatment have been discussed with the patient and family. After consideration of risks, benefits and other options for treatment, the patient has consented to  Procedure(s): JV LEFT HEART CATHETERIZATION WITH CORONARY ANGIOGRAM as a surgical intervention .  The patients' history has been reviewed, patient examined, no change in status, stable for surgery.  I have reviewed the patients' chart and labs.  Questions were answered to the patient's satisfaction.     Elyn Aquas.

## 2011-08-08 NOTE — H&P (View-Only) (Signed)
[name] Elliott Date of Birth  05/22/1928 [facility] [address]    [address] [place], [place]  [address] [phone]  Fax  [phone]  History of Present Illness:  77 year old gentleman with a history of moderate diffuse coronary artery disease. We treated medically. The left anterior descending arteries/ 1st diagonal  stenosis has not was not suitable for PCI.  He complains of being sick in general is weak sensation. He also complains of some left arm pain.   When I saw him last month. He was having some episodes of chest pain. We were trying to decide whether or not these were due to angina. I asked him to take nitroglycerin when he had these episodes of pain. At times the nitroglycerin helps him and at other times the nitroglycerin did not do anything.  When he walks up a hill in his backyard he has significant shortness breath and this "sick feeling".   He has had a partial shoulder replacement for a shoulder fracture.  He has had chronic shoulder pain since that time.  He has chronic knee pain and is considering knee surgery. His office visit was for the purpose of preoperative evaluation.    Current Outpatient Prescriptions on File Prior to Visit  Medication Sig Dispense Refill  . alendronate (FOSAMAX) 70 MG tablet Take 1 tablet (70 mg total) by mouth every 7 (seven) days. Take with a full glass of water on an empty stomach.  4 tablet  11  . aspirin 81 MG EC tablet Take 81 mg by mouth daily.        Marland Kitchen docusate sodium (COLACE) 100 MG capsule Take 100 mg by mouth daily. While on narcotics      . donepezil (ARICEPT) 23 MG TABS tablet Take 23 mg by mouth at bedtime.        . furosemide (LASIX) 40 MG tablet Take 1 tablet (40 mg total) by mouth daily.  60 tablet  11  . gabapentin (NEURONTIN) 300 MG capsule Take 1 capsule (300 mg total) by mouth at bedtime.  30 capsule  3  . isosorbide mononitrate (IMDUR) 60 MG 24 hr tablet Take 1 tablet (60 mg total) by mouth every morning.  30  tablet  11  . nitroGLYCERIN (NITROSTAT) 0.4 MG SL tablet Place 1 tablet (0.4 mg total) under the tongue every 5 (five) minutes as needed for chest pain.  25 tablet  3  . omeprazole (PRILOSEC) 20 MG capsule Take 40 mg by mouth daily.        . potassium chloride SA (K-DUR,KLOR-CON) 20 MEQ tablet Take 20 mEq by mouth daily.        Marland Kitchen HYDROcodone-acetaminophen (NORCO) 5-325 MG per tablet Take 2 tablets by mouth every 6 (six) hours as needed for pain.  90 tablet  3  . traMADol (ULTRAM) 50 MG tablet Take 1 tablet (50 mg total) by mouth every 8 (eight) hours as needed for pain.  60 tablet  2  . DISCONTD: potassium chloride SA (K-DUR,KLOR-CON) 20 MEQ tablet Take 1 tablet (20 mEq total) by mouth 2 (two) times daily.  90 tablet  1    Allergies  Allergen Reactions  . Crestor (Rosuvastatin Calcium)     Intolerance,  Back pain    Past Medical History  Diagnosis Date  . Constipation, chronic   . Sebaceous cyst   . Other malaise and fatigue   . Pain in joint, shoulder region     Has chronic shoulder pain  . Persistent  disorder of initiating or maintaining sleep   . Pain in limb   . Thrombocytopenia, unspecified   . Dementia     with periods of amnesia  . Other B-complex deficiencies   . Hypothyroidism   . Pain in limb   . Esophageal reflux   . CAD (coronary artery disease)     last cath in 2006. Managed medically  . Unspecified essential hypertension   . Arthropathy, unspecified, site unspecified   . Pure hypercholesterolemia   . Pneumonia 2011  . Orthostasis   . PVC's (premature ventricular contractions)     Past Surgical History  Procedure Date  . Hand surgery     Right crush injury, right fifth digit contracture, limited  motion  . Lumbar spine surgery     2007, Dr Eloise Harman (Neurosurgeon)  . Knee surgery     age 13, x2  . Cataract extraction 2009  . Rotator cuff repair 8.30.2011  . US echocardiography 02-28-2010    Est EF 50-55%    History  Smoking status  . Never Smoker     Smokeless tobacco  . Not on file    History  Alcohol Use No    Family History  Problem Relation Age of Onset  . Breast cancer Mother   . Cancer Brother     Lung  . Diabetes Daughter     Reviw of Systems:  Reviewed in the HPI.  All other systems are negative.  Physical Exam: BP 126/58  Pulse 64  Ht 5\' 6"  (1.676 m)  Wt 166 lb (75.297 kg)  BMI 26.79 kg/m2 The patient is alert and oriented x 3.  The mood and affect are normal.   Skin: warm and dry.  Color is normal.    HEENT:   His neck is supple. His carotids are 2+. There is no JVD  Lungs: His lungs are clear.   Heart: Regular rate, S1-S2.    Abdomen: His abdomen is soft gait is good bowel sounds. There is no hepatosplenomegaly.  Extremities:  He has no clubbing cyanosis or edema  Neuro:  His gait is a bit unsteady. He tends to shuffle his feet. His motor and sensory are basically intact.     ECG: Normal sinus rhythm with frequent premature ventricular contractions in a bigeminal pattern. Has an incomplete right bundle branch block  Assessment / Plan:

## 2011-08-08 NOTE — Progress Notes (Signed)
Bedrest begins @ 0910.  Tegaderm dressing applied, post op instructions given.

## 2011-08-08 NOTE — Brief Op Note (Addendum)
    Cardiac Cath Note  [name] Elliott [id number] 12/16/1927  Procedure: left Heart Cardiac Catheterization Note Indications: Unstable angina  Procedure Details Consent: Obtained Time Out: Verified patient identification, verified procedure, site/side was marked, verified correct patient position, special equipment/implants available, Radiology Safety Procedures followed,  medications/allergies/relevent history reviewed, required imaging and test results available.  Performed  The right femoral artery was easily canulated using a modified Seldinger technique.  Hemodynamics:   LV pressure: 10616 Aortic pressure: 105/55  Angiography   Left Main: mild irregularities  Left anterior Descending: The  proximal and mid LAD is moderately calcified. mild irregularities in the prox segment.  There are diffuse irregularities in the mid LAD.  The distal left anterior descending artery has mild irregularities. There are no critical stenosis but the tightest stenosis is a proximally 30-40%. The first diagonal artery is a moderate-sized vessel. There is a moderate diffuse disease between 50 and 60%. These lesions are not severe enough to warrant PCI and do not appear to be causing the obstruction of blood flow.  Left Circumflex: The left circumflex artery is a moderate to large vessel. There are mild to moderate irregular disease in the proximal segment between 20 and 30%. The acute marginal artery is a moderate-sized vessel which is unremarkable.   Right Coronary Artery: The right coronary artery is large and dominant. There are minor luminal regularities. Right coronary artery is moderately calcified throughout its course. The posterior descending artery and posterior lateral branches are mostly normal.  LV Gram: The left ventricular gram was performed in the 30 RAO position at reveals well-preserved left inject her systolic function. There is some catheter-induced ectopy.  Complications: No  apparent complications Patient did tolerate procedure well.  Conclusions:   1. Mild to moderate coronary artery a regularities primarily involving the left anterior descending artery system. None of these lesions were obstructive and in fact the stenosis appears to be stable from his previous catheterization 5 years ago.  2. Well-preserved left inject her systolic function.  3. He should be at low to moderate risk for his upcoming  surgery.  Philip J. Nahser, Jr., MD, FACC 08/08/2011, 8:56 AM   

## 2011-08-08 NOTE — Progress Notes (Signed)
Discharge instructions completed, ambulated to bathroom without bleeding from right groin site.  Discharged to home via wheelchair, daughter is the driver.

## 2011-08-08 NOTE — Op Note (Signed)
    Cardiac Cath Note  [name] Elliott [id number] November 02, 1927  Procedure: left Heart Cardiac Catheterization Note Indications: Unstable angina  Procedure Details Consent: Obtained Time Out: Verified patient identification, verified procedure, site/side was marked, verified correct patient position, special equipment/implants available, Radiology Safety Procedures followed,  medications/allergies/relevent history reviewed, required imaging and test results available.  Performed  The right femoral artery was easily canulated using a modified Seldinger technique.  Hemodynamics:   LV pressure: 10616 Aortic pressure: 105/55  Angiography   Left Main: mild irregularities  Left anterior Descending: The  proximal and mid LAD is moderately calcified. mild irregularities in the prox segment.  There are diffuse irregularities in the mid LAD.  The distal left anterior descending artery has mild irregularities. There are no critical stenosis but the tightest stenosis is a proximally 30-40%. The first diagonal artery is a moderate-sized vessel. There is a moderate diffuse disease between 50 and 60%. These lesions are not severe enough to warrant PCI and do not appear to be causing the obstruction of blood flow.  Left Circumflex: The left circumflex artery is a moderate to large vessel. There are mild to moderate irregular disease in the proximal segment between 20 and 30%. The acute marginal artery is a moderate-sized vessel which is unremarkable.   Right Coronary Artery: The right coronary artery is large and dominant. There are minor luminal regularities. Right coronary artery is moderately calcified throughout its course. The posterior descending artery and posterior lateral branches are mostly normal.  LV Gram: The left ventricular gram was performed in the 30 RAO position at reveals well-preserved left inject her systolic function. There is some catheter-induced ectopy.  Complications: No  apparent complications Patient did tolerate procedure well.  Conclusions:   1. Mild to moderate coronary artery a regularities primarily involving the left anterior descending artery system. None of these lesions were obstructive and in fact the stenosis appears to be stable from his previous catheterization 5 years ago.  2. Well-preserved left inject her systolic function.  3. He should be at low to moderate risk for his upcoming  surgery.  [name], Montez Hageman., MD, [facility] 08/08/2011, 8:56 AM

## 2011-08-09 ENCOUNTER — Other Ambulatory Visit: Payer: Self-pay | Admitting: *Deleted

## 2011-08-09 NOTE — Telephone Encounter (Signed)
error 

## 2011-08-13 ENCOUNTER — Other Ambulatory Visit: Payer: Self-pay | Admitting: Cardiovascular Disease

## 2011-08-13 MED ORDER — POTASSIUM CHLORIDE CRYS ER 20 MEQ PO TBCR: 20.0000 meq | EXTENDED_RELEASE_TABLET | Freq: Two times a day (BID) | ORAL | Status: DC

## 2011-08-15 ENCOUNTER — Ambulatory Visit (INDEPENDENT_AMBULATORY_CARE_PROVIDER_SITE_OTHER): Payer: Medicare Other | Admitting: Internal Medicine

## 2011-08-15 ENCOUNTER — Other Ambulatory Visit (INDEPENDENT_AMBULATORY_CARE_PROVIDER_SITE_OTHER): Payer: Medicare Other

## 2011-08-15 VITALS — BP 130/64 | HR 75 | Temp 97.4°F | Wt 162.0 lb

## 2011-08-15 DIAGNOSIS — I2510 Atherosclerotic heart disease of native coronary artery without angina pectoris: Secondary | ICD-10-CM

## 2011-08-15 DIAGNOSIS — R252 Cramp and spasm: Secondary | ICD-10-CM

## 2011-08-15 DIAGNOSIS — G4700 Insomnia, unspecified: Secondary | ICD-10-CM

## 2011-08-15 DIAGNOSIS — F068 Other specified mental disorders due to known physiological condition: Secondary | ICD-10-CM

## 2011-08-15 DIAGNOSIS — M129 Arthropathy, unspecified: Secondary | ICD-10-CM

## 2011-08-15 NOTE — Patient Instructions (Signed)
Cramping - may be low potassium and/or low magnesium. Aricept may be the cause as well, in addition to causing insomnia! Plan - lab today to check potassium and magnesium. STOP Aricept. If the cramping is not any better after 5 days increase the gabapentin to 300 mg AM and bedtime. After 5 more days if the cramping is not a lot better increase the gabapentin to 300 mg three times a day.   Aricept, in addition to cramping and insomnia, may cause delusions, bad dreams, fatigue and generally feeling bad.  Memory/forgetfullness - after being off Aricept for 5 days please start a Namenda titration pak.  Knee replacement in the long run has a lot of advantages but the short term recovery is painful.

## 2011-08-15 NOTE — Progress Notes (Signed)
  Subjective:    Patient ID: [name] Elliott, male    DOB: [birth year], 77 y.o.   MRN: [id number]  HPI [name] Elliott is seen acutely due to reported immobility this AM. He describes having a full body cramp that kept him from moving. He has been having increased cramps, insomnia, weakness, generalized feeling bad. He associates this with medication - reviewed side effects of aricept: can cause insomnia, cramps, weakness, delusions, and much more. He did have lab 11/30 with a K = 3.2.   He underwent cardiac cath Dec 5th: LAD 50-60% lesion, no obstructive coronary disease, normal LV function. He was cleared for TKR  He has been followed by ortho and a left TKR is proposed but he is not sure he wants to go through with this procedure.   I have reviewed the patient's medical history in detail and updated the computerized patient record.   Review of Systems System review is negative for any constitutional, cardiac, pulmonary, GI or neuro symptoms or complaints other than as described in the HPI.     Objective:   Physical Exam Vitals - stable Gen'l- elderly white man in no acute distress HEENT - C&S clear Pulm - normal respirations, no rales Cor - RRR MSK - missing fingers, full ROM medium and large joints with some discomfort with movement of left knee. Normal ambulation although a little stiff Neuro - A&O x 3, CN II-XII normal       Assessment & Plan:

## 2011-08-16 ENCOUNTER — Other Ambulatory Visit: Payer: Self-pay | Admitting: *Deleted

## 2011-08-16 DIAGNOSIS — R252 Cramp and spasm: Secondary | ICD-10-CM | POA: Insufficient documentation

## 2011-08-16 MED ORDER — OMEPRAZOLE 20 MG PO CPDR: 40.0000 mg | DELAYED_RELEASE_CAPSULE | Freq: Every day | ORAL | Status: DC

## 2011-08-16 MED ORDER — FUROSEMIDE 40 MG PO TABS: 40.0000 mg | ORAL_TABLET | Freq: Every day | ORAL | Status: DC

## 2011-08-16 NOTE — Assessment & Plan Note (Signed)
Generalized cramping that has been progressively getting worse. This may be due to aricept.  Plan - d/c aricept           For continued cramping after being off aricept for 5 days: increase gabapentin to 300mg  bid and then, if needed, to 300 mg tid.

## 2011-08-16 NOTE — Assessment & Plan Note (Signed)
He has been offered left TKR but he is hesitant, mostly due to concerns about the length of rehab and time away from home.  Plan - he is encouraged to move ahead with TKR

## 2011-08-16 NOTE — Assessment & Plan Note (Signed)
Patient has been on aricept 23 mg. He is having many symptoms that may be side effects: cramping, insomnia, fatigue and malaise, change in thinking.  Plan - d/c aricept           After 5 days start Namenda titration pak

## 2011-08-16 NOTE — Assessment & Plan Note (Signed)
Recent cardiac cath Dec 5th ,'12 with no progression of non-obstructive CAD

## 2011-08-16 NOTE — Assessment & Plan Note (Signed)
See comments under dementia

## 2011-08-17 ENCOUNTER — Telehealth: Payer: Self-pay | Admitting: Physician Assistant

## 2011-08-17 MED ORDER — FUROSEMIDE 40 MG PO TABS: 40.0000 mg | ORAL_TABLET | Freq: Two times a day (BID) | ORAL | Status: DC

## 2011-08-17 NOTE — Telephone Encounter (Addendum)
Pt called because her dad's lasix was increased and he ran out before his insurance would allow a refill. New Rx is needed. Did it electronically and advised dtr to call them to make sure it went in. No other issues or concerns.

## 2011-08-19 ENCOUNTER — Telehealth: Payer: Self-pay | Admitting: Internal Medicine

## 2011-08-19 NOTE — Telephone Encounter (Signed)
K and Mg both normal. Cramps better of Aricept??

## 2011-08-20 ENCOUNTER — Encounter: Payer: Self-pay | Admitting: Nurse Practitioner

## 2011-08-20 ENCOUNTER — Other Ambulatory Visit: Payer: Self-pay

## 2011-08-20 ENCOUNTER — Ambulatory Visit (INDEPENDENT_AMBULATORY_CARE_PROVIDER_SITE_OTHER): Payer: Medicare Other | Admitting: Nurse Practitioner

## 2011-08-20 VITALS — BP 120/76 | HR 64 | Ht 66.0 in | Wt 160.0 lb

## 2011-08-20 DIAGNOSIS — I2510 Atherosclerotic heart disease of native coronary artery without angina pectoris: Secondary | ICD-10-CM

## 2011-08-20 MED ORDER — GABAPENTIN 300 MG PO CAPS: 300.0000 mg | ORAL_CAPSULE | Freq: Two times a day (BID) | ORAL | Status: DC

## 2011-08-20 NOTE — Telephone Encounter (Signed)
Pt's daughter called requesting increased quantity of Gabapentin per last OV

## 2011-08-20 NOTE — Patient Instructions (Signed)
Stay on your current medicines.  We will see you back in 6 months.  Call the [facility] office at [phone] if you have any questions, problems or concerns.

## 2011-08-20 NOTE — Progress Notes (Signed)
[name] Date of Birth: Sep 13, 1927 Medical Record #[id number]  History of Present Illness: Lathaniel is seen back today for a post hospital visit. He is seen for Dr. Elease Hashimoto. He has had a cardiac cath. His study showed mild to moderate disease primarily in the LAD. None of the lesions were obstructive and in fact looked stable from his prior study 5 years ago. He will be managed medically.  He is here with his daughter today. He says he is doing fine. Not sure if he is going to proceed on with knee surgery. He does have dementia and was previously on Aricept. Then started having cramps over his entire body. Aricept was stopped. Neurontin was increased and he is to start [employer]. He says he now feels fine.   Current Outpatient Prescriptions on File Prior to Visit  Medication Sig Dispense Refill  . alendronate (FOSAMAX) 70 MG tablet Take 1 tablet (70 mg total) by mouth every 7 (seven) days. Take with a full glass of water on an empty stomach.  4 tablet  11  . aspirin 81 MG EC tablet Take 81 mg by mouth daily.        Marland Kitchen docusate sodium (COLACE) 100 MG capsule Take 100 mg by mouth daily. While on narcotics      . furosemide (LASIX) 40 MG tablet Take 1 tablet (40 mg total) by mouth 2 (two) times daily.  60 tablet  11  . isosorbide mononitrate (IMDUR) 60 MG 24 hr tablet Take 1 tablet (60 mg total) by mouth every morning.  30 tablet  11  . nitroGLYCERIN (NITROSTAT) 0.4 MG SL tablet Place 1 tablet (0.4 mg total) under the tongue every 5 (five) minutes as needed for chest pain.  25 tablet  3  . omeprazole (PRILOSEC) 20 MG capsule Take 2 capsules (40 mg total) by mouth daily.  60 capsule  6  . potassium chloride SA (K-DUR,KLOR-CON) 20 MEQ tablet Take 1 tablet (20 mEq total) by mouth 2 (two) times daily.  60 tablet  6  . DISCONTD: gabapentin (NEURONTIN) 300 MG capsule Take 1 capsule (300 mg total) by mouth at bedtime.  30 capsule  3  . DISCONTD: potassium chloride SA (K-DUR,KLOR-CON) 20 MEQ  tablet Take 1 tablet (20 mEq total) by mouth 2 (two) times daily.  90 tablet  1    Allergies  Allergen Reactions  . Crestor (Rosuvastatin Calcium)     Intolerance,  Back pain    Past Medical History  Diagnosis Date  . Constipation, chronic   . Sebaceous cyst   . Other malaise and fatigue   . Pain in joint, shoulder region     Has chronic shoulder pain  . Persistent disorder of initiating or maintaining sleep   . Pain in limb   . Thrombocytopenia, unspecified   . Dementia     with periods of amnesia  . Other B-complex deficiencies   . Hypothyroidism   . Pain in limb   . Esophageal reflux   . CAD (coronary artery disease)     last cath in 2006. Managed medically  . Unspecified essential hypertension   . Arthropathy, unspecified, site unspecified   . Pure hypercholesterolemia   . Pneumonia 2011  . Orthostasis   . PVC's (premature ventricular contractions)   . S/P cardiac cath 08/08/11    mild to moderate CAD primarily in the LAD. None are obstructive and appear stable from prior cath in 2007; managed medically    Past Surgical History  Procedure Date  . Hand surgery     Right crush injury, right fifth digit contracture, limited  motion  . Lumbar spine surgery     2007, Dr Eloise Harman (Neurosurgeon)  . Knee surgery     age 89, x2  . Cataract extraction 2009  . Rotator cuff repair 8.30.2011  . US echocardiography 02-28-2010    Est EF 50-55%    History  Smoking status  . Never Smoker   Smokeless tobacco  . Not on file    History  Alcohol Use No    Family History  Problem Relation Age of Onset  . Breast cancer Mother   . Cancer Brother     Lung  . Diabetes Daughter     Review of Systems: The review of systems is positive for dementia. No problems with his groin.  All other systems were reviewed and are negative.  Physical Exam: BP 120/76  Pulse 64  Ht 5\' 6"  (1.676 m)  Wt 160 lb (72.576 kg)  BMI 25.82 kg/m2 Patient is alert and in no acute distress.  Skin is warm and dry. Color is normal.  HEENT is unremarkable. Normocephalic/atraumatic. PERRL. Sclera are nonicteric. Neck is supple. No masses. No JVD. Lungs are clear. Cardiac exam shows a regular rate and rhythm. Abdomen is soft. Extremities are without edema. Gait and ROM are intact. No gross neurologic deficits noted.   LABORATORY DATA:   Assessment / Plan:

## 2011-08-20 NOTE — Assessment & Plan Note (Signed)
He is s/p recent cath. He will continue to be managed medically. He is currently doing well. We will see him back in 6 months. Patient is agreeable to this plan and will call if any problems develop in the interim.

## 2011-08-20 NOTE — Telephone Encounter (Signed)
Called pt left msg  °

## 2011-08-29 ENCOUNTER — Telehealth: Payer: Self-pay | Admitting: *Deleted

## 2011-08-29 DIAGNOSIS — I2510 Atherosclerotic heart disease of native coronary artery without angina pectoris: Secondary | ICD-10-CM

## 2011-08-29 NOTE — Telephone Encounter (Signed)
ekg done August 03 2011 and no order was placed, order done so ekg can be scanned.

## 2011-09-06 ENCOUNTER — Telehealth: Payer: Self-pay | Admitting: *Deleted

## 2011-09-06 NOTE — Telephone Encounter (Signed)
Pt's daughter calling office stating pt can not lift his right arm or use his hand. His daughter is with him now. She states he is walking/talking ok with no slurred speech/blurred vision.  He can use his hand some but not fully. She states pt has a HA and he is weak all over...the patient feels like this is coming from [place]. He states the symptoms seem to have started when he started the med. Please advise

## 2011-09-06 NOTE — Telephone Encounter (Signed)
Per Dr. Debby Bud- advised pt's daughter to have pt d/c Namenda to see if symptoms resolve.

## 2011-09-07 ENCOUNTER — Other Ambulatory Visit: Payer: Medicare Other

## 2011-09-07 ENCOUNTER — Encounter: Payer: Medicare Other | Admitting: Vascular Surgery

## 2011-09-24 ENCOUNTER — Telehealth: Payer: Self-pay

## 2011-09-24 NOTE — Telephone Encounter (Signed)
[name] Azucena Kuba called (Patients daughter) to inform stomach pain and swelling on the left side has continued. The patient would like a referral to GI as discussed at last OV. Call back number [phone]

## 2011-09-24 NOTE — Telephone Encounter (Signed)
Referral order placed for GI conbsult

## 2011-09-24 NOTE — Telephone Encounter (Signed)
Patient's daughter informed of referral.  

## 2011-10-10 ENCOUNTER — Other Ambulatory Visit (INDEPENDENT_AMBULATORY_CARE_PROVIDER_SITE_OTHER): Payer: Medicare Other

## 2011-10-10 ENCOUNTER — Encounter: Payer: Self-pay | Admitting: Gastroenterology

## 2011-10-10 ENCOUNTER — Ambulatory Visit (INDEPENDENT_AMBULATORY_CARE_PROVIDER_SITE_OTHER): Payer: Medicare Other | Admitting: Gastroenterology

## 2011-10-10 VITALS — BP 118/68 | HR 80 | Ht 67.0 in | Wt 169.0 lb

## 2011-10-10 DIAGNOSIS — R1012 Left upper quadrant pain: Secondary | ICD-10-CM

## 2011-10-10 LAB — CBC WITH DIFFERENTIAL/PLATELET
Basophils Relative: 1.1 % (ref 0.0–3.0)
Eosinophils Absolute: 0.3 10*3/uL (ref 0.0–0.7)
Eosinophils Relative: 4.1 % (ref 0.0–5.0)
Lymphocytes Relative: 31.9 % (ref 12.0–46.0)
MCHC: 34.3 g/dL (ref 30.0–36.0)
Neutrophils Relative %: 53.7 % (ref 43.0–77.0)
Platelets: 171 10*3/uL (ref 150.0–400.0)
RBC: 4.44 Mil/uL (ref 4.22–5.81)
WBC: 6.8 10*3/uL (ref 4.5–10.5)

## 2011-10-10 NOTE — Assessment & Plan Note (Addendum)
Patient has had several weeks of a left upper quadrant pain which is worsened postprandially. This raises the question of an upper GI lesion. A lower GI lesion must also be considered. Ulcer and nonulcer dyspepsia and neoplasm are possibilities.  Recommendations #1 upper endoscopy #2 colonoscopy if upper endoscopy is negative #3 consider CT of the abdomen pending results of above

## 2011-10-10 NOTE — Progress Notes (Signed)
History of Present Illness: Mr. Newbury is an 78 year old white male referred at the request of Dr. Ranell Patrick for evaluation of the abdominal pain. For several months he's been complaining of left upper quadrant pain. It is fairly sharp and worsened postprandially. He denies nausea or vomiting. There's been no change in his bowel habits. It is not affected by moving his bowels. He denies dysuria, melena or hematochezia. He takes omeprazole daily.    Past Medical History  Diagnosis Date  . Constipation, chronic   . Sebaceous cyst   . Other malaise and fatigue   . Pain in joint, shoulder region     Has chronic shoulder pain  . Persistent disorder of initiating or maintaining sleep   . Pain in limb   . Thrombocytopenia, unspecified   . Dementia     with periods of amnesia  . Other B-complex deficiencies   . Hypothyroidism   . Pain in limb   . Esophageal reflux   . CAD (coronary artery disease)     last cath in 2006. Managed medically  . Unspecified essential hypertension   . Arthropathy, unspecified, site unspecified   . Pure hypercholesterolemia   . Pneumonia 2011  . Orthostasis   . PVC's (premature ventricular contractions)   . S/P cardiac cath 08/08/11    mild to moderate CAD primarily in the LAD. None are obstructive and appear stable from prior cath in 2007; managed medically  . Arthritis    Past Surgical History  Procedure Date  . Hand surgery     Right crush injury, right fifth digit contracture, limited  motion  . Lumbar spine surgery     2007, Dr Eloise Harman (Neurosurgeon)  . Knee surgery     age 65, x2  . Cataract extraction 2009  . Rotator cuff repair 8.30.2011  . US echocardiography 02-28-2010    Est EF 50-55%   family history includes Breast cancer in his mother; Cancer in his brother; Diabetes in his daughter; and Kidney disease in his father.  There is no history of Colon cancer. Current Outpatient Prescriptions  Medication Sig Dispense Refill  . alendronate  (FOSAMAX) 70 MG tablet Take 1 tablet (70 mg total) by mouth every 7 (seven) days. Take with a full glass of water on an empty stomach.  4 tablet  11  . aspirin 81 MG EC tablet Take 81 mg by mouth daily.        Marland Kitchen docusate sodium (COLACE) 100 MG capsule Take 100 mg by mouth daily. While on narcotics      . furosemide (LASIX) 40 MG tablet Take 1 tablet (40 mg total) by mouth 2 (two) times daily.  60 tablet  11  . gabapentin (NEURONTIN) 300 MG capsule Take 1 capsule (300 mg total) by mouth 2 (two) times daily.  60 capsule  3  . isosorbide mononitrate (IMDUR) 60 MG 24 hr tablet Take 1 tablet (60 mg total) by mouth every morning.  30 tablet  11  . nitroGLYCERIN (NITROSTAT) 0.4 MG SL tablet Place 1 tablet (0.4 mg total) under the tongue every 5 (five) minutes as needed for chest pain.  25 tablet  3  . omeprazole (PRILOSEC) 20 MG capsule Take 2 capsules (40 mg total) by mouth daily.  60 capsule  6  . potassium chloride SA (K-DUR,KLOR-CON) 20 MEQ tablet Take 1 tablet (20 mEq total) by mouth 2 (two) times daily.  60 tablet  6  . Psyllium (METAMUCIL PO) Take by mouth as directed.      Marland Kitchen  DISCONTD: potassium chloride SA (K-DUR,KLOR-CON) 20 MEQ tablet Take 1 tablet (20 mEq total) by mouth 2 (two) times daily.  90 tablet  1   Allergies as of 10/10/2011 - Review Complete 10/10/2011  Allergen Reaction Noted  . Crestor (rosuvastatin calcium)  05/14/2011    reports that he has never smoked. His smokeless tobacco use includes Chew. He reports that he does not drink alcohol or use illicit drugs.     Review of Systems: He complains of total body ache which he attributes to arthritis. Pertinent positive and negative review of systems were noted in the above HPI section. All other review of systems were otherwise negative.  Vital signs were reviewed in today's medical record Physical Exam: General: Well developed , well nourished, no acute distress Head: Normocephalic and atraumatic Eyes:  sclerae anicteric,  EOMI Ears: Normal auditory acuity Mouth: No deformity or lesions Neck: Supple, no masses or thyromegaly Lungs: Clear throughout to auscultation Heart: Regular rate and rhythm; no murmurs, rubs or bruits Abdomen: Soft,  and non distended. No masses, hepatosplenomegaly or hernias noted. Normal Bowel sounds; there is mild tenderness to palpation in the left upper quadrant just lateral and superior to the umbilicus Rectal:deferred Musculoskeletal: Symmetrical with no gross deformities  Skin: No lesions on visible extremities Pulses:  Normal pulses noted Extremities: No clubbing, cyanosis,  or deformities noted; there is a 2-3+ pedal edema  Neurological: Alert oriented x 4, grossly nonfocal Cervical Nodes:  No significant cervical adenopathy Inguinal Nodes: No significant inguinal adenopathy Psychological:  Alert and cooperative. Normal mood and affect

## 2011-10-10 NOTE — Patient Instructions (Addendum)
You have been given a separate informational sheet regarding your tobacco use, the importance of quitting and local resources to help you quit. Your Endoscopy is scheduled on 10/15/2011  At 11:30am Separate instructions have been given

## 2011-10-11 LAB — COMPREHENSIVE METABOLIC PANEL
AST: 28 U/L (ref 0–37)
Albumin: 4.1 g/dL (ref 3.5–5.2)
BUN: 19 mg/dL (ref 6–23)
Calcium: 9.1 mg/dL (ref 8.4–10.5)
Chloride: 103 mEq/L (ref 96–112)
Potassium: 3.6 mEq/L (ref 3.5–5.1)
Sodium: 138 mEq/L (ref 135–145)
Total Protein: 7.3 g/dL (ref 6.0–8.3)

## 2011-10-12 ENCOUNTER — Telehealth: Payer: Self-pay

## 2011-10-12 DIAGNOSIS — R6889 Other general symptoms and signs: Secondary | ICD-10-CM

## 2011-10-12 NOTE — Telephone Encounter (Signed)
Message copied by Michele Mcalpine on Fri Oct 12, 2011  3:32 PM ------      Message from: Melvia Heaps D      Created: Thu Oct 11, 2011  2:24 PM       Note increased Cr.      Needs u/a and renal ultrasound

## 2011-10-12 NOTE — Telephone Encounter (Signed)
Spoke with pts daughter. She states she will bring him Monday for the u/a. Renal ultrasound scheduled for 10/16/11@[facility] . Pt to arrive at 2:45pm for a 3pm appt. No prep needed for the procedure.

## 2011-10-15 ENCOUNTER — Ambulatory Visit (AMBULATORY_SURGERY_CENTER): Payer: Medicare Other | Admitting: Gastroenterology

## 2011-10-15 ENCOUNTER — Other Ambulatory Visit (INDEPENDENT_AMBULATORY_CARE_PROVIDER_SITE_OTHER): Payer: Medicare Other

## 2011-10-15 ENCOUNTER — Encounter: Payer: Self-pay | Admitting: Gastroenterology

## 2011-10-15 VITALS — BP 123/77 | HR 66 | Temp 96.2°F | Resp 19 | Ht 67.0 in | Wt 169.0 lb

## 2011-10-15 DIAGNOSIS — K2970 Gastritis, unspecified, without bleeding: Secondary | ICD-10-CM

## 2011-10-15 DIAGNOSIS — R6889 Other general symptoms and signs: Secondary | ICD-10-CM

## 2011-10-15 DIAGNOSIS — K2990 Gastroduodenitis, unspecified, without bleeding: Secondary | ICD-10-CM

## 2011-10-15 DIAGNOSIS — R1012 Left upper quadrant pain: Secondary | ICD-10-CM

## 2011-10-15 LAB — URINALYSIS
Bilirubin Urine: NEGATIVE
Hgb urine dipstick: NEGATIVE
Ketones, ur: NEGATIVE
Leukocytes, UA: NEGATIVE
Nitrite: NEGATIVE
Specific Gravity, Urine: <=1.005 (ref 1.000–1.030)
Total Protein, Urine: NEGATIVE
Urine Glucose: NEGATIVE
Urobilinogen, UA: 0.2 (ref 0.0–1.0)
pH: 5.5 (ref 5.0–8.0)

## 2011-10-15 MED ORDER — HYOSCYAMINE SULFATE ER 0.375 MG PO TBCR: EXTENDED_RELEASE_TABLET | ORAL | Status: DC

## 2011-10-15 MED ORDER — SODIUM CHLORIDE 0.9 % IV SOLN: 500.0000 mL | INTRAVENOUS | Status: DC

## 2011-10-15 NOTE — Progress Notes (Signed)
Patient did not experience any of the following events: a burn prior to discharge; a fall within the facility; wrong site/side/patient/procedure/implant event; or a hospital transfer or hospital admission upon discharge from the facility. (G8907) Patient did not have preoperative order for IV antibiotic SSI prophylaxis. (G8918)  

## 2011-10-15 NOTE — Op Note (Signed)
[facility] [address] [place], [place]  [address]  ENDOSCOPY PROCEDURE REPORT  PATIENT:  [name] Elliott, [name] Elliott  MR#:  [id number] BIRTHDATE:  14-Dec-1927, 83 yrs. old  GENDER:  male  ENDOSCOPIST:  Barbette Hair. Arlyce Dice, MD Referred by:  Rosalyn Gess. Norins, M.D.  PROCEDURE DATE:  10/15/2011 PROCEDURE:  EGD with biopsy, 43239 ASA CLASS:  Class II INDICATIONS:  abdominal pain  MEDICATIONS:   MAC sedation, administered by CRNA propofol 120mg IV, glycopyrrolate (Robinal) 0.2 mg IV, 0.6cc simethancone 0.6 cc PO TOPICAL ANESTHETIC:  DESCRIPTION OF PROCEDURE:   After the risks and benefits of the procedure were explained, informed consent was obtained.  The LB [id number]-H180 [id number] endoscope was introduced through the mouth and advanced to the third portion of the duodenum.  The instrument was slowly withdrawn as the mucosa was fully examined. <<PROCEDUREIMAGES>>  Moderate gastritis was found. Nonerosive gastritis involving the body, fundus and cardia. Bxs taken (see image1, image5, and image6).  Otherwise the examination was normal (see image2, image4, and image7).    Retroflexed views revealed no abnormalities.    The scope was then withdrawn from the patient and the procedure completed.  COMPLICATIONS:  None  ENDOSCOPIC IMPRESSION: 1) Moderate gastritis 2) Otherwise normal examination RECOMMENDATIONS: 1) Await biopsy results 2) Call office next 2-3 days to schedule an office appointment for 3 weeks 3) trial of hyomax 4) t/c colonoscopy pending results/response to above  ______________________________ Barbette Hair. Arlyce Dice, MD  CC:  n. eSIGNED:   Barbette Hair. Kaplan at 10/15/2011 11:59 AM  [name], [id number]

## 2011-10-15 NOTE — Patient Instructions (Signed)
Resume medications. Information provided on gastritis.D/C Information reviewed with family.

## 2011-10-16 ENCOUNTER — Ambulatory Visit (HOSPITAL_COMMUNITY)
Admission: RE | Admit: 2011-10-16 | Discharge: 2011-10-16 | Disposition: A | Discharge status: Home / Self Care | Payer: Medicare Other | Source: Ambulatory Visit | Attending: Gastroenterology | Admitting: Gastroenterology

## 2011-10-16 ENCOUNTER — Telehealth: Payer: Self-pay | Admitting: *Deleted

## 2011-10-16 DIAGNOSIS — R944 Abnormal results of kidney function studies: Secondary | ICD-10-CM | POA: Insufficient documentation

## 2011-10-16 DIAGNOSIS — R6889 Other general symptoms and signs: Secondary | ICD-10-CM

## 2011-10-16 NOTE — Telephone Encounter (Signed)
  Follow up Call-  Call back number 10/15/2011 10/15/2011  Post procedure Call Back phone  # [phone]  Permission to leave phone message Yes Yes     Patient questions:  Do you have a fever, pain , or abdominal swelling? yes Pain Score  6 *  Have you tolerated food without any problems? yes  Have you been able to return to your normal activities? yes  Do you have any questions about your discharge instructions: Diet   no Medications  no Follow up visit  no  Do you have questions or concerns about your Care? no  Actions: * If pain score is 4 or above: Physician/ provider Notified : Melvia Heaps, MD. [phone] Spoke with Dr. Arlyce Dice. Advised of Pt. Complaints of pain level "6" above umbilicus.  Also advised that pt. Is otherwise having bowel movements, eating , and able to resume normal activities.  Dr. Arlyce Dice advised tylenol Tabs 2 every four hours for discomfort.  Pt. Advised to call office if pain does not subside or worsens.

## 2011-10-19 ENCOUNTER — Encounter: Payer: Self-pay | Admitting: Gastroenterology

## 2011-10-22 ENCOUNTER — Telehealth: Payer: Self-pay | Admitting: Gastroenterology

## 2011-10-22 NOTE — Telephone Encounter (Signed)
Spoke with [name] and explained Dr. Arlyce Dice does not call with normal reports. Gave her results of ultrasound. She also asked about biopsy results from procedure. Read her the letter mailed on 10/19/11. She states patient has an OV on 11/13/11 to f/u.

## 2011-11-13 ENCOUNTER — Ambulatory Visit (INDEPENDENT_AMBULATORY_CARE_PROVIDER_SITE_OTHER): Payer: Medicare Other | Admitting: Gastroenterology

## 2011-11-13 ENCOUNTER — Other Ambulatory Visit (INDEPENDENT_AMBULATORY_CARE_PROVIDER_SITE_OTHER): Payer: Medicare Other

## 2011-11-13 ENCOUNTER — Encounter: Payer: Self-pay | Admitting: Gastroenterology

## 2011-11-13 DIAGNOSIS — R1012 Left upper quadrant pain: Secondary | ICD-10-CM

## 2011-11-13 DIAGNOSIS — R109 Unspecified abdominal pain: Secondary | ICD-10-CM

## 2011-11-13 LAB — BUN: BUN: 15 mg/dL (ref 6–23)

## 2011-11-13 LAB — LIPASE: Lipase: 25 U/L (ref 11.0–59.0)

## 2011-11-13 NOTE — Progress Notes (Signed)
History of Present Illness:  [name] Elliott continues to complain of left upper quadrant pain.  It is fairly chronic although he has had episodes of severe pain that may radiate toward the midline lasting minutes at a time. Hyomax has not been helpful. Pain is unrelated to eating or moving his bowels. Upper endoscopy demonstrated  nonerosive gastritis.    Review of Systems: Pertinent positive and negative review of systems were noted in the above HPI section. All other review of systems were otherwise negative.    Current Medications, Allergies, Past Medical History, Past Surgical History, Family History and Social History were reviewed in [employer] electronic medical record  Vital signs were reviewed in today's medical record. Physical Exam: General: Well developed , well nourished, no acute distress On abdominal exam there is mild tenderness in the left upper quadrant without guarding or rebound. There are no abdominal masses or organomegaly

## 2011-11-13 NOTE — Patient Instructions (Signed)
You have been scheduled for a CT scan of the abdomen and pelvis at [facility] ([address]---this is in the same building as [facility]).   You are scheduled on Thursday 11/15/11 at 1:00 pm. You should arrive 15 minutes prior to your appointment time for registration. Please follow the written instructions below on the day of your exam:  WARNING: IF YOU ARE ALLERGIC TO IODINE/X-RAY DYE, PLEASE NOTIFY RADIOLOGY IMMEDIATELY AT [phone]! YOU WILL BE GIVEN A 13 HOUR PREMEDICATION PREP.  1) Do not eat or drink anything after 9:00 am (4 hours prior to your test) 2) You have been given 2 bottles of oral contrast to drink. The solution may taste better if refrigerated, but do NOT add ice or any other liquid to this solution. Shake well before drinking.    Drink 1 bottle of contrast @ 11:00 am (2 hours prior to your exam)  Drink 1 bottle of contrast @ 12:00 pm (1 hour prior to your exam)  You may take any medications as prescribed with a small amount of water except for the following: Metformin, Glucophage, Glucovance, Avandamet, Riomet, Fortamet, Actoplus Met, Janumet, Glumetza or Metaglip. The above medications must be held the day of the exam AND 48 hours after the exam.  The purpose of you drinking the oral contrast is to aid in the visualization of your intestinal tract. The contrast solution may cause some diarrhea. Before your exam is started, you will be given a small amount of fluid to drink. Depending on your individual set of symptoms, you may also receive an intravenous injection of x-ray contrast/dye. Plan on being at [facility] for 30 minutes or long, depending on the type of exam you are having performed.  If you have any questions regarding your exam or if you need to reschedule, you may call the CT department at [phone] between the hours of 8:00 am and 5:00 pm,  Monday-Friday.  ________________________________________________________________________ Your physician has requested that you go to the basement for the following lab work before leaving today: Amylase, Lipase, BUN, creatinine

## 2011-11-13 NOTE — Assessment & Plan Note (Addendum)
He has persistent intermittent abdominal pain in the face of PPI therapy and anticholinergics. Etiology is unclear. Chronic cholecystitis is a consideration although the pain tends to be on the left side. An underlying pancreatic lesion should be ruled out. Pain is not related to bowel movements rendering a colonic etiology less likely. I doubt he has diverticulitis. Finally, musckulo -skeletal pain seems less likely although remains a consideration.  Recommendations #1 CT of the abdomen and pelvis #2 check amylase and lipase

## 2011-11-15 ENCOUNTER — Ambulatory Visit (INDEPENDENT_AMBULATORY_CARE_PROVIDER_SITE_OTHER)
Admission: RE | Admit: 2011-11-15 | Discharge: 2011-11-15 | Disposition: A | Discharge status: Home / Self Care | Payer: Medicare Other | Source: Ambulatory Visit | Attending: Gastroenterology | Admitting: Gastroenterology

## 2011-11-15 DIAGNOSIS — R109 Unspecified abdominal pain: Secondary | ICD-10-CM

## 2011-11-15 MED ORDER — IOHEXOL 300 MG/ML  SOLN
100.0000 mL | Freq: Once | INTRAMUSCULAR | Status: AC | PRN
  Administered 2011-11-15: 100 mL via INTRAVENOUS

## 2011-11-15 NOTE — Progress Notes (Signed)
Quick Note:  Please inform the patient that CT scan did not demonstrate any remarkable findings to explain his abdominal pain. This proceed with colonoscopy. ______

## 2011-11-19 ENCOUNTER — Ambulatory Visit (AMBULATORY_SURGERY_CENTER): Payer: Medicare Other | Admitting: *Deleted

## 2011-11-19 VITALS — Ht 66.0 in | Wt 170.0 lb

## 2011-11-19 DIAGNOSIS — R1012 Left upper quadrant pain: Secondary | ICD-10-CM

## 2011-11-19 DIAGNOSIS — R109 Unspecified abdominal pain: Secondary | ICD-10-CM

## 2011-11-19 MED ORDER — PEG-KCL-NACL-NASULF-NA ASC-C 100 G PO SOLR: ORAL | Status: DC

## 2011-11-23 ENCOUNTER — Encounter: Payer: Self-pay | Admitting: Gastroenterology

## 2011-11-23 ENCOUNTER — Ambulatory Visit (AMBULATORY_SURGERY_CENTER): Payer: Medicare Other | Admitting: Gastroenterology

## 2011-11-23 VITALS — BP 126/83 | HR 70 | Temp 97.5°F | Resp 20 | Ht 66.0 in | Wt 170.0 lb

## 2011-11-23 DIAGNOSIS — K5730 Diverticulosis of large intestine without perforation or abscess without bleeding: Secondary | ICD-10-CM

## 2011-11-23 DIAGNOSIS — R1012 Left upper quadrant pain: Secondary | ICD-10-CM

## 2011-11-23 DIAGNOSIS — R109 Unspecified abdominal pain: Secondary | ICD-10-CM

## 2011-11-23 DIAGNOSIS — D126 Benign neoplasm of colon, unspecified: Secondary | ICD-10-CM

## 2011-11-23 MED ORDER — SULINDAC 200 MG PO TABS: ORAL_TABLET | ORAL | Status: DC

## 2011-11-23 MED ORDER — SODIUM CHLORIDE 0.9 % IV SOLN: 500.0000 mL | INTRAVENOUS | Status: DC

## 2011-11-23 NOTE — Progress Notes (Signed)
Propofol given per D Merritt CRNA 

## 2011-11-23 NOTE — Progress Notes (Signed)
O2 titrated and managed per Paulita Cradle CRNA

## 2011-11-23 NOTE — Op Note (Signed)
[facility] [address] [place], [place]  [address]  COLONOSCOPY PROCEDURE REPORT  PATIENT:  [name] Elliott, [name] Elliott  MR#:  [id number] BIRTHDATE:  01-Sep-1928, 83 yrs. old  GENDER:  male ENDOSCOPIST:  Barbette Hair. Arlyce Dice, MD REF. BY: PROCEDURE DATE:  11/23/2011 PROCEDURE:  Colonoscopy with snare polypectomy ASA CLASS:  Class II INDICATIONS:  Abdominal pain MEDICATIONS:   MAC sedation, administered by CRNA propofol 150mg iv  DESCRIPTION OF PROCEDURE:   After the risks benefits and alternatives of the procedure were thoroughly explained, informed consent was obtained.  Digital rectal exam was performed and revealed no abnormalities.   The LB 180AL [id number] endoscope was introduced through the anus and advanced to the cecum, which was identified by both the appendix and ileocecal valve, without limitations.  The quality of the prep was Moviprep fair.  The instrument was then slowly withdrawn as the colon was fully examined.<<PROCEDUREIMAGES>>  FINDINGS:  A sessile polyp was found in the ascending colon. It was 4 mm in size. Polyp was snared without cautery. Retrieval was successful (see image2). snare polyp  Moderate diverticulosis was found (see image4 and image6). Moderately severe diverticulosis This was otherwise a normal examination of the colon (see image1 and image7).   Retroflexed views in the rectum revealed no abnormalities.    The time to cecum =  1) 3.0  minutes. The scope was then withdrawn in  1) 7.50  minutes from the cecum and the procedure completed. COMPLICATIONS:  None ENDOSCOPIC IMPRESSION: 1) 4 mm sessile polyp in the ascending colon 2) Moderate diverticulosis 3) Otherwise normal examination  Finding do not explain etiology of abdominal pain RECOMMENDATIONS: 1) Given your age, you will not need another colonoscopy for colon cancer screening or polyp surveillance. These types of tests usually stop around the age 8. 2) trial of clinoril REPEAT EXAM:   No  ______________________________ Barbette Hair. Arlyce Dice, MD  CC:  Jacques Navy, MD  n. Rosalie DoctorBarbette Hair. Latha Staunton at 11/23/2011 03:53 PM  [name], [id number]

## 2011-11-23 NOTE — Progress Notes (Signed)
Patient did not experience any of the following events: a burn prior to discharge; a fall within the facility; wrong site/side/patient/procedure/implant event; or a hospital transfer or hospital admission upon discharge from the facility. (G8907) Patient did not have preoperative order for IV antibiotic SSI prophylaxis. (G8918)  

## 2011-11-23 NOTE — Patient Instructions (Addendum)
Try clinoril (taken on a full stomach) twice a day for 4-5 days. Office visit in 2 weeks. Discharge instructions given with verbal understanding. Handouts on polyps and diverticulosis given. Resume previous medications.YOU HAD AN ENDOSCOPIC PROCEDURE TODAY AT THE [facility]: Refer to the procedure report that was given to you for any specific questions about what was found during the examination.  If the procedure report does not answer your questions, please call your gastroenterologist to clarify.  If you requested that your care partner not be given the details of your procedure findings, then the procedure report has been included in a sealed envelope for you to review at your convenience later.  YOU SHOULD EXPECT: Some feelings of bloating in the abdomen. Passage of more gas than usual.  Walking can help get rid of the air that was put into your GI tract during the procedure and reduce the bloating. If you had a lower endoscopy (such as a colonoscopy or flexible sigmoidoscopy) you may notice spotting of blood in your stool or on the toilet paper. If you underwent a bowel prep for your procedure, then you may not have a normal bowel movement for a few days.  DIET: Your first meal following the procedure should be a light meal and then it is ok to progress to your normal diet.  A half-sandwich or bowl of soup is an example of a good first meal.  Heavy or fried foods are harder to digest and may make you feel nauseous or bloated.  Likewise meals heavy in dairy and vegetables can cause extra gas to form and this can also increase the bloating.  Drink plenty of fluids but you should avoid alcoholic beverages for 24 hours.  ACTIVITY: Your care partner should take you home directly after the procedure.  You should plan to take it easy, moving slowly for the rest of the day.  You can resume normal activity the day after the procedure however you should NOT DRIVE or use heavy machinery for 24  hours (because of the sedation medicines used during the test).    SYMPTOMS TO REPORT IMMEDIATELY: A gastroenterologist can be reached at any hour.  During normal business hours, 8:30 AM to 5:00 PM Monday through Friday, call [phone].  After hours and on weekends, please call the GI answering service at [phone] who will take a message and have the physician on call contact you.   Following lower endoscopy (colonoscopy or flexible sigmoidoscopy):  Excessive amounts of blood in the stool  Significant tenderness or worsening of abdominal pains  Swelling of the abdomen that is new, acute  Fever of 100F or higher  FOLLOW UP: If any biopsies were taken you will be contacted by phone or by letter within the next 1-3 weeks.  Call your gastroenterologist if you have not heard about the biopsies in 3 weeks.  Our staff will call the home number listed on your records the next business day following your procedure to check on you and address any questions or concerns that you may have at that time regarding the information given to you following your procedure. This is a courtesy call and so if there is no answer at the home number and we have not heard from you through the emergency physician on call, we will assume that you have returned to your regular daily activities without incident.  SIGNATURES/CONFIDENTIALITY: You and/or your care partner have signed paperwork which will be entered into your electronic medical  record.  These signatures attest to the fact that that the information above on your After Visit Summary has been reviewed and is understood.  Full responsibility of the confidentiality of this discharge information lies with you and/or your care-partner.

## 2011-11-26 ENCOUNTER — Telehealth: Payer: Self-pay | Admitting: *Deleted

## 2011-11-26 NOTE — Telephone Encounter (Signed)
  Follow up Call-  Call back number 11/23/2011 10/15/2011 10/15/2011  Post procedure Call Back phone  # [phone] hm [phone]  Permission to leave phone message Yes Yes Yes     Patient questions:  Message left to call me if necessary.

## 2011-12-03 ENCOUNTER — Encounter: Payer: Self-pay | Admitting: Gastroenterology

## 2011-12-25 ENCOUNTER — Other Ambulatory Visit: Payer: Self-pay

## 2011-12-25 ENCOUNTER — Telehealth: Payer: Self-pay | Admitting: *Deleted

## 2011-12-25 MED ORDER — GABAPENTIN 300 MG PO CAPS: 300.0000 mg | ORAL_CAPSULE | Freq: Two times a day (BID) | ORAL | Status: DC

## 2011-12-25 NOTE — Telephone Encounter (Signed)
Caller requesting Orthopaedic referral for patient to have left shoulder pain evaluated.

## 2011-12-26 NOTE — Telephone Encounter (Signed)
k

## 2011-12-26 NOTE — Telephone Encounter (Signed)
Recommend OV to determine nature of problem - may be able to help. May add on today or tomorrow

## 2011-12-26 NOTE — Telephone Encounter (Signed)
Called pt and scheduled ov.  The pt's daughter (the primary care giver) wanted to let you know that she is very worried about her dad continuing to drive.  She states she is worried for her safety and his when he is driving.  She is fearful bringing this up in front of the patient will cause him to become angry.

## 2012-01-01 ENCOUNTER — Ambulatory Visit (INDEPENDENT_AMBULATORY_CARE_PROVIDER_SITE_OTHER): Payer: Medicare Other | Admitting: Internal Medicine

## 2012-01-01 ENCOUNTER — Encounter: Payer: Self-pay | Admitting: Internal Medicine

## 2012-01-01 VITALS — BP 122/80 | HR 66 | Temp 97.6°F | Resp 66 | Wt 171.0 lb

## 2012-01-01 DIAGNOSIS — Z96619 Presence of unspecified artificial shoulder joint: Secondary | ICD-10-CM | POA: Insufficient documentation

## 2012-01-01 DIAGNOSIS — M25512 Pain in left shoulder: Secondary | ICD-10-CM

## 2012-01-01 DIAGNOSIS — T84019A Broken internal joint prosthesis, unspecified site, initial encounter: Secondary | ICD-10-CM

## 2012-01-01 DIAGNOSIS — M25519 Pain in unspecified shoulder: Secondary | ICD-10-CM

## 2012-01-01 HISTORY — DX: Broken internal joint prosthesis, unspecified site, initial encounter: T84.019A

## 2012-01-01 NOTE — Patient Instructions (Addendum)
Shoulder pain after replacement. - will refer you to Dr. Mina Marble  for a second opinion. YOu will need to obtain a CD ROM of the most recent MRI.  Cough and congestion - lungs are clear today. Try taking Mucinex 1200 mg at bedtime.

## 2012-01-02 NOTE — Progress Notes (Signed)
Subjective:    Patient ID: [name] Elliott, male    DOB: 1927/11/19, 78 y.o.   MRN: [id number]  HPI [name] Elliott presents for referral for a second opinion on his shoulder pain. He has a history of left shoulder replacement by [name]. He is continuing to have pain and limitation in ROM. Fortunately it is his nondominant arm that is involved. [name] has told him there is nothing further that can be done with surgery and he should do physical therapy.  He has had a full evaluation for abdominal pain per Dr. Arlyce Dice that has been unrevealing.  Past Medical History  Diagnosis Date  . Constipation, chronic   . Sebaceous cyst   . Other malaise and fatigue   . Pain in joint, shoulder region     Has chronic shoulder pain  . Persistent disorder of initiating or maintaining sleep   . Pain in limb   . Thrombocytopenia, unspecified   . Dementia     with periods of amnesia  . Other B-complex deficiencies   . Hypothyroidism   . Pain in limb   . Esophageal reflux   . CAD (coronary artery disease)     last cath in 2006. Managed medically  . Unspecified essential hypertension   . Arthropathy, unspecified, site unspecified   . Pure hypercholesterolemia   . Pneumonia 2011  . Orthostasis   . PVC's (premature ventricular contractions)   . S/P cardiac cath 08/08/11    mild to moderate CAD primarily in the LAD. None are obstructive and appear stable from prior cath in 2007; managed medically  . Arthritis    Past Surgical History  Procedure Date  . Hand surgery     Right crush injury, right fifth digit contracture, limited  motion  . Lumbar spine surgery     2007, Dr Eloise Harman (Neurosurgeon)  . Knee surgery     age 38, x2  . Cataract extraction 2009  . Rotator cuff repair 8.30.2011  . US echocardiography 02-28-2010    Est EF 50-55%   Family History  Problem Relation Age of Onset  . Breast cancer Mother   . Cancer Brother     throat  . Diabetes Daughter   . Colon cancer Neg Hx   .  Kidney disease Father    History   Social History  . Marital Status: Widowed    Spouse Name: N/A    Number of Children: 2  . Years of Education: N/A   Occupational History  . retired    Social History Main Topics  . Smoking status: Never Smoker   . Smokeless tobacco: Current User    Types: Chew   Comment: tobacco info given 10/10/2011  . Alcohol Use: No  . Drug Use: No  . Sexually Active: Not on file   Other Topics Concern  . Not on file   Social History Narrative   2nd grade educationMarried '58, '952 dtr '59, '64, youngest daughter died septic kidneyLives alone, handles all adlsRegular Exercise -  NODaily caffine      Review of Systems System review is negative for any constitutional, cardiac, pulmonary, GI or neuro symptoms or complaints other than as described in the HPI.     Objective:   Physical Exam Filed Vitals:   01/01/12 1058  BP: 122/80  Pulse: 66  Temp: 97.6 F (36.4 C)  Resp: 66   Wt Readings from Last 3 Encounters:  01/01/12 171 lb (77.565 kg)  11/23/11 170 lb (77.111 kg)  11/19/11 170 lb (77.111 kg)   Gen'l- elderly white man in no distress Pulm - normal respirations, lungs clear Cor - RRR MSK - decreased passive ROM left shoulder with marked pain with movement.. Normal grip strength.       Assessment & Plan:

## 2012-01-02 NOTE — Assessment & Plan Note (Signed)
Patient with marked shoulder pain that per [name] cannot be helped with surgery. Patient wishes a second opinion. He has seen Dr. Mina Marble in the past.  Plan - refer to Dr. Mina Marble.

## 2012-01-14 ENCOUNTER — Telehealth: Payer: Self-pay

## 2012-01-14 NOTE — Telephone Encounter (Signed)
Patient daughter called to check status of surgical clearance that was sent over. She is requesting that this is completed and sent back today Thanks

## 2012-01-14 NOTE — Telephone Encounter (Signed)
I think I have done this but cannot find in EPIC. Check with medical records

## 2012-01-22 ENCOUNTER — Encounter (HOSPITAL_COMMUNITY): Payer: Self-pay | Admitting: Pharmacy Technician

## 2012-01-23 ENCOUNTER — Other Ambulatory Visit: Payer: Self-pay | Admitting: Orthopedic Surgery

## 2012-01-24 ENCOUNTER — Inpatient Hospital Stay (HOSPITAL_COMMUNITY): Admission: RE | Admit: 2012-01-24 | Discharge: 2012-01-24 | Payer: Medicare Other | Source: Ambulatory Visit

## 2012-01-24 ENCOUNTER — Encounter (HOSPITAL_COMMUNITY): Payer: Self-pay

## 2012-01-24 HISTORY — DX: Headache: R51

## 2012-01-24 NOTE — Pre-Procedure Instructions (Signed)
22 [name] Elliott  01/24/2012   Your procedure is scheduled on:  02/05/12  Report to [facility] at 5:30 AM.  Call this number if you have problems the morning of surgery: [phone]   Remember: Bring Incentive Spirometer back in suitcase day of surgery.   Do not eat food:After Midnight.  May have clear liquids: up to 4 Hours before arrival (1:30 AM).  Clear liquids include soda, tea, black coffee, apple or grape juice, broth.  Take these medicines the morning of surgery with A SIP OF WATER: Gabapentin/Neurontin, Imdur/Isosorbide, Omeprazole/Prilosec   Do not wear jewelry, make-up or nail polish.  Do not wear lotions, powders, or perfumes. You may wear deodorant.  Do not shave 48 hours prior to surgery. Men may shave face and neck.  Do not bring valuables to the hospital.  Contacts, dentures or bridgework may not be worn into surgery.  Leave suitcase in the car. After surgery it may be brought to your room.  For patients admitted to the hospital, checkout time is 11:00 AM the day of discharge.   Patients discharged the day of surgery will not be allowed to drive home.    Special Instructions: CHG Shower Use Special Wash: 1/2 bottle night before surgery and 1/2 bottle morning of surgery.   Please read over the following fact sheets that you were given: Pain Booklet, Coughing and Deep Breathing, Blood Transfusion Information, MRSA Information and Surgical Site Infection Prevention

## 2012-01-24 NOTE — Progress Notes (Signed)
Spoke with daughter to complete health hx

## 2012-01-29 NOTE — Consult Note (Signed)
Anesthesia Chart Review:  Patient is a 77 year old male scheduled for left shoulder removal of failed hardware and conversion of hemiarthroplasty to reverse total shoulder arthroplasty on 02/05/12.  His PAT lab visit is on 01/30/12.    History includes non-smoker, CAD (mild-moderate, non-obstructive CAD by cath 08/2011), dementia, hypothyroidism, GERD, PNA '11, headaches, arthritis, hypercholesterolemia, thrombocytopenia, prior lumbar, hand, knee, shoulder, and cataract surgeries.   PCP is Dr. Illene Regulus who provided medical clearance for this procedure.    His Cardiologist is Dr. Elease Hashimoto.  He was seen in November 2012 for a pre-operative evaluation.  A LHC was recommended as he was experiencing new DOE and "sick feeling" with activity with a recent abnormal Myvoview (05/29/11) showing a small area of anterior apical ischemia, EF 50%.    Cardiac cath (Dr. Elease Hashimoto) on 08/08/11 showed: 1. Mild to moderate coronary artery a regularities primarily involving the left anterior descending artery system. None of these lesions were obstructive and in fact the stenosis appears to be stable from his previous catheterization 5 years ago.  2. Well-preserved left inject her systolic function.  At that time, he was considering knee (TKR) surgery, and Dr. Elease Hashimoto cleared him at "low to moderate risk."  Mr. Hyder ultimately decided not to have that surgery.  EKG from [facility] on 08/03/11 showed NSR with sinus arrhythmia, pulmonary disease pattern, incomplete right BBB, LAFB, prolonged QT.  It was stable since at least October 2012, and he subsequently had a cardiac cath as above.  His last visit at [facility] was on 08/20/11.  He was felt to be doing well from a cardiac standpoint, and continued medical therapy was recommended.  Dr. Elease Hashimoto is aware of planned procedure.  CXR on 04/25/11 showed: 1. Chronic interstitial coarsening.  2. No acute cardiopulmonary abnormalities.   He is for labs on  01/30/12.    If he does not report any new CV symptoms and labs are reasonable, then anticipate he can proceed as planned.  Shonna Chock, PA-C

## 2012-01-30 ENCOUNTER — Encounter (HOSPITAL_COMMUNITY)
Admission: RE | Admit: 2012-01-30 | Discharge: 2012-01-30 | Disposition: A | Discharge status: Home / Self Care | Payer: Medicare Other | Source: Ambulatory Visit | Attending: Orthopedic Surgery | Admitting: Orthopedic Surgery

## 2012-01-30 LAB — BASIC METABOLIC PANEL
BUN: 16 mg/dL (ref 6–23)
CO2: 26 mEq/L (ref 19–32)
Chloride: 99 mEq/L (ref 96–112)
Creatinine, Ser: 1.22 mg/dL (ref 0.50–1.35)
GFR calc Af Amer: 62 mL/min — ABNORMAL LOW (ref >90)
Potassium: 3.5 mEq/L (ref 3.5–5.1)

## 2012-01-30 LAB — URINALYSIS, ROUTINE W REFLEX MICROSCOPIC
Ketones, ur: NEGATIVE mg/dL
Leukocytes, UA: NEGATIVE
Nitrite: NEGATIVE
Protein, ur: NEGATIVE mg/dL
Urobilinogen, UA: 0.2 mg/dL (ref 0.0–1.0)

## 2012-01-30 LAB — CBC
HCT: 45.2 % (ref 39.0–52.0)
Hemoglobin: 15.3 g/dL (ref 13.0–17.0)
MCV: 89.7 fL (ref 78.0–100.0)
RDW: 14.4 % (ref 11.5–15.5)
WBC: 8.1 10*3/uL (ref 4.0–10.5)

## 2012-01-30 LAB — TYPE AND SCREEN: ABO/RH(D): O POS

## 2012-01-30 LAB — PROTIME-INR: INR: 1 (ref 0.00–1.49)

## 2012-01-30 LAB — SURGICAL PCR SCREEN: MRSA, PCR: NEGATIVE

## 2012-01-31 ENCOUNTER — Other Ambulatory Visit: Payer: Self-pay | Admitting: Orthopedic Surgery

## 2012-02-04 MED ORDER — CEFAZOLIN SODIUM 1-5 GM-% IV SOLN: 1.0000 g | INTRAVENOUS | Status: DC

## 2012-02-04 MED ORDER — CEFAZOLIN SODIUM 1-5 GM-% IV SOLN
1.0000 g | INTRAVENOUS | Status: DC
  Filled 2012-02-04 (×2): qty 50

## 2012-02-05 ENCOUNTER — Encounter (HOSPITAL_COMMUNITY)
Admission: RE | Disposition: A | Discharge status: Home / Self Care | Payer: Self-pay | Source: Ambulatory Visit | Attending: Orthopedic Surgery

## 2012-02-05 ENCOUNTER — Encounter (HOSPITAL_COMMUNITY): Payer: Self-pay | Admitting: Orthopedic Surgery

## 2012-02-05 ENCOUNTER — Ambulatory Visit (HOSPITAL_COMMUNITY): Payer: Medicare Other | Admitting: Vascular Surgery

## 2012-02-05 ENCOUNTER — Encounter (HOSPITAL_COMMUNITY): Payer: Self-pay | Admitting: Vascular Surgery

## 2012-02-05 ENCOUNTER — Inpatient Hospital Stay (HOSPITAL_COMMUNITY): Payer: Medicare Other

## 2012-02-05 ENCOUNTER — Inpatient Hospital Stay (HOSPITAL_COMMUNITY)
Admission: RE | Admit: 2012-02-05 | Discharge: 2012-02-07 | DRG: 484 | Disposition: A | Discharge status: Home / Self Care | Payer: Medicare Other | Source: Ambulatory Visit | Attending: Orthopedic Surgery | Admitting: Orthopedic Surgery

## 2012-02-05 ENCOUNTER — Encounter (HOSPITAL_COMMUNITY): Payer: Self-pay | Admitting: Surgery

## 2012-02-05 DIAGNOSIS — Z7982 Long term (current) use of aspirin: Secondary | ICD-10-CM

## 2012-02-05 DIAGNOSIS — T84498A Other mechanical complication of other internal orthopedic devices, implants and grafts, initial encounter: Principal | ICD-10-CM | POA: Diagnosis present

## 2012-02-05 DIAGNOSIS — E039 Hypothyroidism, unspecified: Secondary | ICD-10-CM | POA: Diagnosis present

## 2012-02-05 DIAGNOSIS — F0390 Unspecified dementia without behavioral disturbance: Secondary | ICD-10-CM | POA: Diagnosis present

## 2012-02-05 DIAGNOSIS — Z01812 Encounter for preprocedural laboratory examination: Secondary | ICD-10-CM

## 2012-02-05 DIAGNOSIS — Y849 Medical procedure, unspecified as the cause of abnormal reaction of the patient, or of later complication, without mention of misadventure at the time of the procedure: Secondary | ICD-10-CM | POA: Diagnosis present

## 2012-02-05 DIAGNOSIS — T84019A Broken internal joint prosthesis, unspecified site, initial encounter: Secondary | ICD-10-CM

## 2012-02-05 DIAGNOSIS — I2510 Atherosclerotic heart disease of native coronary artery without angina pectoris: Secondary | ICD-10-CM | POA: Diagnosis present

## 2012-02-05 DIAGNOSIS — E7800 Pure hypercholesterolemia, unspecified: Secondary | ICD-10-CM | POA: Diagnosis present

## 2012-02-05 DIAGNOSIS — Z96619 Presence of unspecified artificial shoulder joint: Secondary | ICD-10-CM

## 2012-02-05 DIAGNOSIS — K5900 Constipation, unspecified: Secondary | ICD-10-CM | POA: Diagnosis present

## 2012-02-05 HISTORY — PX: REVERSE SHOULDER ARTHROPLASTY: SHX5054

## 2012-02-05 HISTORY — PX: HARDWARE REMOVAL: SHX979

## 2012-02-05 HISTORY — DX: Broken internal joint prosthesis, unspecified site, initial encounter: T84.019A

## 2012-02-05 SURGERY — REMOVAL, HARDWARE
Anesthesia: General | Laterality: Left | Wound class: Clean

## 2012-02-05 MED ORDER — ZOLPIDEM TARTRATE 5 MG PO TABS: 5.0000 mg | ORAL_TABLET | Freq: Every evening | ORAL | Status: DC | PRN

## 2012-02-05 MED ORDER — PROPOFOL 10 MG/ML IV EMUL
INTRAVENOUS | Status: DC | PRN
  Administered 2012-02-05: 100 mg via INTRAVENOUS

## 2012-02-05 MED ORDER — ASPIRIN EC 81 MG PO TBEC
81.0000 mg | DELAYED_RELEASE_TABLET | Freq: Every day | ORAL | Status: DC
  Administered 2012-02-05 – 2012-02-07 (×3): 81 mg via ORAL
  Filled 2012-02-05 (×3): qty 1

## 2012-02-05 MED ORDER — ROCURONIUM BROMIDE 100 MG/10ML IV SOLN
INTRAVENOUS | Status: DC | PRN
  Administered 2012-02-05: 50 mg via INTRAVENOUS

## 2012-02-05 MED ORDER — LACTATED RINGERS IV SOLN
INTRAVENOUS | Status: DC | PRN
  Administered 2012-02-05 (×2): via INTRAVENOUS

## 2012-02-05 MED ORDER — METHOCARBAMOL 500 MG PO TABS: 500.0000 mg | ORAL_TABLET | Freq: Four times a day (QID) | ORAL | Status: AC

## 2012-02-05 MED ORDER — HYDROMORPHONE HCL PF 1 MG/ML IJ SOLN: 0.2500 mg | INTRAMUSCULAR | Status: DC | PRN

## 2012-02-05 MED ORDER — GLYCOPYRROLATE 0.2 MG/ML IJ SOLN
INTRAMUSCULAR | Status: DC | PRN
  Administered 2012-02-05 (×2): 0.3 mg via INTRAVENOUS
  Administered 2012-02-05: .6 mg via INTRAVENOUS

## 2012-02-05 MED ORDER — ONDANSETRON HCL 4 MG/2ML IJ SOLN: 4.0000 mg | Freq: Once | INTRAMUSCULAR | Status: DC | PRN

## 2012-02-05 MED ORDER — MENTHOL 3 MG MT LOZG: 1.0000 | LOZENGE | OROMUCOSAL | Status: DC | PRN

## 2012-02-05 MED ORDER — METHOCARBAMOL 500 MG PO TABS
500.0000 mg | ORAL_TABLET | Freq: Four times a day (QID) | ORAL | Status: DC | PRN
  Administered 2012-02-05 – 2012-02-07 (×5): 500 mg via ORAL
  Filled 2012-02-05 (×5): qty 1

## 2012-02-05 MED ORDER — SODIUM CHLORIDE 0.9 % IR SOLN
Status: DC | PRN
  Administered 2012-02-05: 1000 mL

## 2012-02-05 MED ORDER — METOCLOPRAMIDE HCL 5 MG PO TABS
5.0000 mg | ORAL_TABLET | Freq: Three times a day (TID) | ORAL | Status: DC | PRN
  Filled 2012-02-05: qty 2

## 2012-02-05 MED ORDER — ISOSORBIDE MONONITRATE ER 60 MG PO TB24
60.0000 mg | ORAL_TABLET | Freq: Every day | ORAL | Status: DC
  Administered 2012-02-06 – 2012-02-07 (×2): 60 mg via ORAL
  Filled 2012-02-05 (×2): qty 1

## 2012-02-05 MED ORDER — BISACODYL 10 MG RE SUPP: 10.0000 mg | Freq: Every day | RECTAL | Status: DC | PRN

## 2012-02-05 MED ORDER — CEFAZOLIN SODIUM 1-5 GM-% IV SOLN
1.0000 g | Freq: Four times a day (QID) | INTRAVENOUS | Status: AC
  Administered 2012-02-05 – 2012-02-06 (×3): 1 g via INTRAVENOUS
  Filled 2012-02-05 (×3): qty 50

## 2012-02-05 MED ORDER — ALBUMIN HUMAN 5 % IV SOLN
INTRAVENOUS | Status: DC | PRN
  Administered 2012-02-05: 09:00:00 via INTRAVENOUS

## 2012-02-05 MED ORDER — PHENYLEPHRINE HCL 10 MG/ML IJ SOLN
20.0000 mg | INTRAVENOUS | Status: DC | PRN
  Administered 2012-02-05: 20 ug/min via INTRAVENOUS

## 2012-02-05 MED ORDER — DIPHENHYDRAMINE HCL 12.5 MG/5ML PO ELIX: 12.5000 mg | ORAL_SOLUTION | ORAL | Status: DC | PRN

## 2012-02-05 MED ORDER — FENTANYL CITRATE 0.05 MG/ML IJ SOLN
INTRAMUSCULAR | Status: DC | PRN
  Administered 2012-02-05 (×3): 50 ug via INTRAVENOUS

## 2012-02-05 MED ORDER — SENNA 8.6 MG PO TABS
1.0000 | ORAL_TABLET | Freq: Two times a day (BID) | ORAL | Status: DC
  Administered 2012-02-05 – 2012-02-07 (×5): 8.6 mg via ORAL
  Filled 2012-02-05 (×6): qty 1

## 2012-02-05 MED ORDER — POTASSIUM CHLORIDE CRYS ER 20 MEQ PO TBCR
20.0000 meq | EXTENDED_RELEASE_TABLET | Freq: Two times a day (BID) | ORAL | Status: DC
  Administered 2012-02-05 – 2012-02-07 (×5): 20 meq via ORAL
  Filled 2012-02-05 (×6): qty 1

## 2012-02-05 MED ORDER — POTASSIUM CHLORIDE IN NACL 20-0.45 MEQ/L-% IV SOLN
INTRAVENOUS | Status: DC
  Administered 2012-02-05 – 2012-02-06 (×2): via INTRAVENOUS
  Filled 2012-02-05 (×5): qty 1000

## 2012-02-05 MED ORDER — ASPIRIN 81 MG PO TBEC: 81.0000 mg | DELAYED_RELEASE_TABLET | Freq: Every day | ORAL | Status: DC

## 2012-02-05 MED ORDER — NITROGLYCERIN 0.4 MG SL SUBL: 0.4000 mg | SUBLINGUAL_TABLET | SUBLINGUAL | Status: DC | PRN

## 2012-02-05 MED ORDER — ONDANSETRON HCL 4 MG PO TABS: 4.0000 mg | ORAL_TABLET | Freq: Four times a day (QID) | ORAL | Status: DC | PRN

## 2012-02-05 MED ORDER — METOCLOPRAMIDE HCL 5 MG/ML IJ SOLN: 5.0000 mg | Freq: Three times a day (TID) | INTRAMUSCULAR | Status: DC | PRN

## 2012-02-05 MED ORDER — FUROSEMIDE 40 MG PO TABS
40.0000 mg | ORAL_TABLET | Freq: Two times a day (BID) | ORAL | Status: DC
  Administered 2012-02-05 – 2012-02-07 (×4): 40 mg via ORAL
  Filled 2012-02-05 (×6): qty 1

## 2012-02-05 MED ORDER — LIDOCAINE HCL (CARDIAC) 20 MG/ML IV SOLN
INTRAVENOUS | Status: DC | PRN
  Administered 2012-02-05: 60 mg via INTRAVENOUS

## 2012-02-05 MED ORDER — GABAPENTIN 300 MG PO CAPS
300.0000 mg | ORAL_CAPSULE | Freq: Two times a day (BID) | ORAL | Status: DC
  Administered 2012-02-05 – 2012-02-07 (×5): 300 mg via ORAL
  Filled 2012-02-05 (×6): qty 1

## 2012-02-05 MED ORDER — LIDOCAINE HCL 4 % MT SOLN
OROMUCOSAL | Status: DC | PRN
  Administered 2012-02-05: 4 mL via TOPICAL

## 2012-02-05 MED ORDER — NEOSTIGMINE METHYLSULFATE 1 MG/ML IJ SOLN
INTRAMUSCULAR | Status: DC | PRN
  Administered 2012-02-05: 4 mg via INTRAVENOUS

## 2012-02-05 MED ORDER — ACETAMINOPHEN 10 MG/ML IV SOLN
INTRAVENOUS | Status: DC | PRN
  Administered 2012-02-05: 1000 mg via INTRAVENOUS

## 2012-02-05 MED ORDER — MAGNESIUM HYDROXIDE 400 MG/5ML PO SUSP: 30.0000 mL | Freq: Every day | ORAL | Status: DC | PRN

## 2012-02-05 MED ORDER — ACETAMINOPHEN 10 MG/ML IV SOLN
INTRAVENOUS | Status: AC
  Filled 2012-02-05: qty 100

## 2012-02-05 MED ORDER — ACETAMINOPHEN 325 MG PO TABS
650.0000 mg | ORAL_TABLET | Freq: Four times a day (QID) | ORAL | Status: DC | PRN
  Filled 2012-02-05: qty 2

## 2012-02-05 MED ORDER — OXYCODONE-ACETAMINOPHEN 5-325 MG PO TABS
1.0000 | ORAL_TABLET | ORAL | Status: DC | PRN
  Administered 2012-02-05 – 2012-02-07 (×6): 2 via ORAL
  Filled 2012-02-05 (×6): qty 2

## 2012-02-05 MED ORDER — METHOCARBAMOL 100 MG/ML IJ SOLN
500.0000 mg | Freq: Four times a day (QID) | INTRAVENOUS | Status: DC | PRN
  Filled 2012-02-05: qty 5

## 2012-02-05 MED ORDER — PHENYLEPHRINE HCL 10 MG/ML IJ SOLN
INTRAMUSCULAR | Status: DC | PRN
  Administered 2012-02-05: 80 ug via INTRAVENOUS

## 2012-02-05 MED ORDER — ONDANSETRON HCL 4 MG/2ML IJ SOLN: 4.0000 mg | Freq: Four times a day (QID) | INTRAMUSCULAR | Status: DC | PRN

## 2012-02-05 MED ORDER — BUPIVACAINE HCL (PF) 0.5 % IJ SOLN
INTRAMUSCULAR | Status: DC | PRN
  Administered 2012-02-05: 20 mL

## 2012-02-05 MED ORDER — OXYCODONE-ACETAMINOPHEN 10-325 MG PO TABS: 1.0000 | ORAL_TABLET | Freq: Four times a day (QID) | ORAL | Status: AC | PRN

## 2012-02-05 MED ORDER — ALUM & MAG HYDROXIDE-SIMETH 200-200-20 MG/5ML PO SUSP: 30.0000 mL | ORAL | Status: DC | PRN

## 2012-02-05 MED ORDER — OXYCODONE HCL 5 MG PO TABS
5.0000 mg | ORAL_TABLET | ORAL | Status: DC | PRN
  Administered 2012-02-06 (×2): 10 mg via ORAL
  Filled 2012-02-05 (×2): qty 2

## 2012-02-05 MED ORDER — OXYCODONE-ACETAMINOPHEN 10-325 MG PO TABS
1.0000 | ORAL_TABLET | Freq: Four times a day (QID) | ORAL | Status: DC | PRN
Start: 2012-02-05 — End: 2012-02-05

## 2012-02-05 MED ORDER — ACETAMINOPHEN 650 MG RE SUPP: 650.0000 mg | Freq: Four times a day (QID) | RECTAL | Status: DC | PRN

## 2012-02-05 MED ORDER — DOCUSATE SODIUM 100 MG PO CAPS
100.0000 mg | ORAL_CAPSULE | Freq: Two times a day (BID) | ORAL | Status: DC
  Administered 2012-02-05 – 2012-02-07 (×5): 100 mg via ORAL
  Filled 2012-02-05 (×5): qty 1

## 2012-02-05 MED ORDER — PHENOL 1.4 % MT LIQD: 1.0000 | OROMUCOSAL | Status: DC | PRN

## 2012-02-05 MED ORDER — CEFAZOLIN SODIUM 1-5 GM-% IV SOLN
INTRAVENOUS | Status: DC | PRN
  Administered 2012-02-05: 1 g via INTRAVENOUS

## 2012-02-05 MED ORDER — PROMETHAZINE HCL 25 MG PO TABS: 25.0000 mg | ORAL_TABLET | Freq: Four times a day (QID) | ORAL | Status: DC | PRN

## 2012-02-05 MED ORDER — HYDROMORPHONE HCL PF 1 MG/ML IJ SOLN: 0.5000 mg | INTRAMUSCULAR | Status: DC | PRN

## 2012-02-05 MED ORDER — PANTOPRAZOLE SODIUM 40 MG PO TBEC
40.0000 mg | DELAYED_RELEASE_TABLET | Freq: Every day | ORAL | Status: DC
  Administered 2012-02-05 – 2012-02-07 (×3): 40 mg via ORAL
  Filled 2012-02-05 (×3): qty 1

## 2012-02-05 SURGICAL SUPPLY — 88 items
ADPR HD STD TPR HUM TI RVRS (Orthopedic Implant) ×1 IMPLANT
APL SKNCLS STERI-STRIP NONHPOA (GAUZE/BANDAGES/DRESSINGS) ×1
BEARING HUIMERAL STRL 44-36MM (Orthopedic Implant) IMPLANT
BENZOIN TINCTURE PRP APPL 2/3 (GAUZE/BANDAGES/DRESSINGS) ×2 IMPLANT
BIT DRILL F/CENTRAL SCRW 3.2 (BIT) ×1
BIT DRILL F/CENTRAL SCRW 3.2MM (BIT) IMPLANT
BIT DRILL TWIST 2.7 (BIT) ×1 IMPLANT
BLADE SAW SAG 29X58X.64 (BLADE) ×3 IMPLANT
BOOTCOVER CLEANROOM LRG (PROTECTIVE WEAR) ×3 IMPLANT
BOWL SMART MIX CTS (DISPOSABLE) IMPLANT
BRNG HUM STD 36-44 STRL LF (Orthopedic Implant) ×1 IMPLANT
BRUSH FEMORAL CANAL (MISCELLANEOUS) IMPLANT
BSPLAT GLND 28 SHLDR RVRS SYS (Orthopedic Implant) ×1 IMPLANT
CLOTH BEACON ORANGE TIMEOUT ST (SAFETY) ×2 IMPLANT
CLSR STERI-STRIP ANTIMIC 1/2X4 (GAUZE/BANDAGES/DRESSINGS) ×1 IMPLANT
COVER SURGICAL LIGHT HANDLE (MISCELLANEOUS) ×2 IMPLANT
COVER TABLE BACK 60X90 (DRAPES) ×2 IMPLANT
DRAPE C-ARM 42X72 X-RAY (DRAPES) IMPLANT
DRAPE INCISE IOBAN 66X45 STRL (DRAPES) ×2 IMPLANT
DRAPE U-SHAPE 47X51 STRL (DRAPES) ×2 IMPLANT
DRILL BIT F/CENTRAL SCRW 3.2MM (BIT) ×2
DRSG MEPILEX BORDER 4X8 (GAUZE/BANDAGES/DRESSINGS) ×1 IMPLANT
DRSG PAD ABDOMINAL 8X10 ST (GAUZE/BANDAGES/DRESSINGS) ×1 IMPLANT
DURAPREP 26ML APPLICATOR (WOUND CARE) ×2 IMPLANT
ELECT BLADE 6.5 EXT (BLADE) IMPLANT
ELECT NDL TIP 2.8 STRL (NEEDLE) ×1 IMPLANT
ELECT NEEDLE TIP 2.8 STRL (NEEDLE) IMPLANT
ELECT REM PT RETURN 9FT ADLT (ELECTROSURGICAL) ×2
ELECTRODE REM PT RTRN 9FT ADLT (ELECTROSURGICAL) ×1 IMPLANT
EVACUATOR 1/8 PVC DRAIN (DRAIN) IMPLANT
FACESHIELD LNG OPTICON STERILE (SAFETY) ×3 IMPLANT
GLENOID SPHERE STD STRL 36MM (Orthopedic Implant) ×1 IMPLANT
GLENOID SPHERE STRL 28MM (Orthopedic Implant) ×1 IMPLANT
GLOVE BIOGEL PI IND STRL 8 (GLOVE) ×1 IMPLANT
GLOVE BIOGEL PI INDICATOR 8 (GLOVE)
GLOVE ORTHO TXT STRL SZ7.5 (GLOVE) ×2 IMPLANT
GLOVE SURG ORTHO 8.0 STRL STRW (GLOVE) ×2 IMPLANT
GOWN STRL NON-REIN LRG LVL3 (GOWN DISPOSABLE) ×1 IMPLANT
HANDPIECE INTERPULSE COAX TIP (DISPOSABLE)
HEAD HUMERAL COMP STD (Orthopedic Implant) IMPLANT
HOOD PEEL AWAY FACE SHEILD DIS (HOOD) ×4 IMPLANT
HUMERAL BEARING STRL 44-36MM (Orthopedic Implant) ×2 IMPLANT
HUMERAL HEAD COMP STD (Orthopedic Implant) ×2 IMPLANT
KIT BASIN OR (CUSTOM PROCEDURE TRAY) ×2 IMPLANT
KIT ROOM TURNOVER OR (KITS) ×2 IMPLANT
MANIFOLD NEPTUNE II (INSTRUMENTS) ×2 IMPLANT
NDL 1/2 CIR CATGUT .05X1.09 (NEEDLE) ×1 IMPLANT
NDL HYPO 25GX1X1/2 BEV (NEEDLE) ×1 IMPLANT
NEEDLE 1/2 CIR CATGUT .05X1.09 (NEEDLE) ×2 IMPLANT
NEEDLE HYPO 25GX1X1/2 BEV (NEEDLE) IMPLANT
NS IRRIG 1000ML POUR BTL (IV SOLUTION) ×2 IMPLANT
PACK SHOULDER (CUSTOM PROCEDURE TRAY) ×2 IMPLANT
PAD ARMBOARD 7.5X6 YLW CONV (MISCELLANEOUS) ×4 IMPLANT
PIN THREADED REVERSE (PIN) ×1 IMPLANT
RETRIEVER SUT HEWSON (MISCELLANEOUS) IMPLANT
SCREW BONE LOCKING 30MMX4.75MM (Screw) ×1 IMPLANT
SCREW BONE STRL 6.5MMX35MM (Screw) ×1 IMPLANT
SCREW LOCKING 15MMX4.7MM (Screw) ×1 IMPLANT
SCREW LOCKING 4.75MMX20MM (Screw) ×1 IMPLANT
SCREW LOCKING 4.75MMX35MM (Screw) ×1 IMPLANT
SET HNDPC FAN SPRY TIP SCT (DISPOSABLE) IMPLANT
SLING ARM IMMOBILIZER LRG (SOFTGOODS) IMPLANT
SLING ARM IMMOBILIZER MED (SOFTGOODS) ×1 IMPLANT
SMARTMIX MINI TOWER (MISCELLANEOUS)
SPONGE GAUZE 4X4 12PLY (GAUZE/BANDAGES/DRESSINGS) ×1 IMPLANT
SPONGE LAP 18X18 X RAY DECT (DISPOSABLE) ×3 IMPLANT
STEM SHDR COMP 13MMX140MM (Orthopedic Implant) ×1 IMPLANT
STRIP CLOSURE SKIN 1/2X4 (GAUZE/BANDAGES/DRESSINGS) ×2 IMPLANT
SUCTION FRAZIER TIP 10 FR DISP (SUCTIONS) ×2 IMPLANT
SUPPORT WRAP ARM LG (MISCELLANEOUS) ×1 IMPLANT
SUT ETHIBOND 2 0 SH (SUTURE)
SUT ETHIBOND 2 0 SH 36X2 (SUTURE) IMPLANT
SUT ETHIBOND 2 OS 4 DA (SUTURE) IMPLANT
SUT ETHIBOND CT1 BRD 2-0 30IN (SUTURE) ×2 IMPLANT
SUT FIBERWIRE #2 38 T-5 BLUE (SUTURE) ×6
SUT MNCRL AB 4-0 PS2 18 (SUTURE) ×2 IMPLANT
SUT VIC AB 2-0 CT1 27 (SUTURE) ×2
SUT VIC AB 2-0 CT1 TAPERPNT 27 (SUTURE) ×1 IMPLANT
SUT VIC AB 2-0 SH 18 (SUTURE) ×2 IMPLANT
SUTURE FIBERWR #2 38 T-5 BLUE (SUTURE) ×3 IMPLANT
SYR CONTROL 10ML LL (SYRINGE) ×1 IMPLANT
TOWEL OR 17X24 6PK STRL BLUE (TOWEL DISPOSABLE) ×2 IMPLANT
TOWEL OR 17X26 10 PK STRL BLUE (TOWEL DISPOSABLE) ×2 IMPLANT
TOWER SMARTMIX MINI (MISCELLANEOUS) IMPLANT
TRAY FOLEY CATH 14FR (SET/KITS/TRAYS/PACK) ×1 IMPLANT
TRAY HUM STD 44MM (Orthopedic Implant) ×1 IMPLANT
TUBE SUCT ARGYLE STRL (TUBING) IMPLANT
WATER STERILE IRR 1000ML POUR (IV SOLUTION) ×2 IMPLANT

## 2012-02-05 NOTE — Progress Notes (Signed)
UR COMPLETED  

## 2012-02-05 NOTE — Preoperative (Signed)
Beta Blockers   Reason not to administer Beta Blockers:Not Applicable 

## 2012-02-05 NOTE — Op Note (Signed)
02/05/2012  11:03 AM  PATIENT:  [name] Elliott    PRE-OPERATIVE DIAGNOSIS:  FAILED LEFT SHOULDER hemiarthroplasty  POST-OPERATIVE DIAGNOSIS:  Same  PROCEDURE:  Removal of left shoulder hemiarthroplasty with replacement with a reverse total shoulder  SURGEON:  Eulas Post, MD  PHYSICIAN ASSISTANT: April Green, registered nurse first Asst.  ANESTHESIA:   General  PREOPERATIVE INDICATIONS:  Cristofher Livecchi is a  78 y.o. male with a diagnosis of FAILED LEFT SHOULDER who failed conservative measures and elected for surgical management.  He had almost no abduction, and severe pain on an ongoing basis. He elected for surgical management. Preoperative x-rays were concerning for a rotator cuff deficiency.  The risks benefits and alternatives were discussed with the patient preoperatively including but not limited to the risks of infection, bleeding, nerve injury, cardiopulmonary complications, the need for revision surgery, dislocation, loosening, incomplete relief of pain, among others, and the patient was willing to proceed.   OPERATIVE IMPLANTS: I removed a [employer] hemiarthroplasty stem, and replaced this with a [employer] standard size glenoid base plate with a total of 4 locking screws, of note the posterior screw had excellent purchase, although the screw was slightly stripped during the placement process. I used a standard comprehensive reverse shoulder system with a size 36 mm glenoid sphere with an 13 mm press-fit standard humeral stem with standard humeral tray/polyethylene.  OPERATIVE FINDINGS: The humeral stem was not well ingrown, and was fairly loose. The subscapularis was present, and the supraspinatus was also present although somewhat atrophic and dysfunctional appearing.   OPERATIVE PROCEDURE: The patient is brought to the operating room and placed in the supine position. General anesthesia was administered. IV Ancef was given.   A Foley was placed. The left upper extremity was  prepped and draped in usual sterile fashion. He was in a beachchair position with all bony prominences padded.   Time out was performed and a deltopectoral approach was carried out. The biceps tendon was tenodesed to the pectoralis tendon. It had previously been left intact. The subscapularis was released, tagging it with a #2 FiberWire, taking it directly off bone in order to maximize length for subsequent repair.  I mobilized the bone around the fins, and used a saw to resect more of the humeral neck. I removed the bone around the fins circumferentially, and the stem appears loose. It came out quite easily.  I then placed deep retractors and exposed the glenoid. I excised the labrum circumferentially, taking care to protect the axillary nerve inferiorly. I also had partially excised the supraspinatus.  I then placed a guidewire into the center position, controlling appropriate version and inclination. I was angled slightly inferior, central in the glenoid. I then reamed over the guidewire with the reamer, and was satisfied with the preparation. I had circumferential bone around the central guidewire. I preserved the subchondral bone in order to maximize the strength and minimize the risk for subsequent subsidence. I trialed with the standard baseplate trial, and this seated well, so this was selected and impacted into place. I then placed the central screw, bicortically. Excellent fixation was achieved. I then placed the superior, inferior, anterior and posterior screws. During placement of the posterior screw, I had a fair amount of bite, and the screw stripped slightly. Nonetheless I did get it seated completely, however this is a substantial screw, measuring 35 mm. This is slightly unusual, but may have reflected the deep bony architecture.  The glenoid sphere was then impacted into place and seated fully.  I sequentially broached, up to the selected size, and I debated whether or not to cement the  prosthesis, particularly given that his previous press-fit prosthesis did not hole. Nonetheless, I felt that the reason the previous prosthesis did not hole was possibly due to under sizing, as I was able to broach up to a size 13, much larger than the previously implanted prosthesis. I had excellent press-fit stability with rotation, and therefore did proceed with a press-fit fixation.  The broach set at 30 of retroversion.   I trialed with a standard tray, and this had 2 finger tightness, and so the real stem and tray were placed. This was placed in 30 of retroversion.  Excellent stability and range of motion was achieved. I repaired the subscapularis with 4 #2 FiberWire, as well as the rotator interval, and irrigated copiously once more. The subcutaneous tissue was closed with Vicryl including the deltopectoral fascia. I did repair the subscapularis through drill holes in the humerus using #2 FiberWire.  The skin was closed with Steri-Strips and sterile gauze was applied. He had a preoperative nerve block. He tolerated the procedure well and there were no complications.

## 2012-02-05 NOTE — Transfer of Care (Signed)
Immediate Anesthesia Transfer of Care Note  Patient: [name] Elliott  Procedure(s) Performed: Procedure(s) (LRB): HARDWARE REMOVAL (Left) REVERSE SHOULDER ARTHROPLASTY (Left)  Patient Location: PACU  Anesthesia Type: GA combined with regional for post-op pain  Level of Consciousness: awake, alert  and oriented  Airway & Oxygen Therapy: Patient Spontanous Breathing and Patient connected to nasal cannula oxygen  Post-op Assessment: Report given to PACU RN and Post -op Vital signs reviewed and stable  Post vital signs: Reviewed and stable  Complications: No apparent anesthesia complications

## 2012-02-05 NOTE — Discharge Instructions (Signed)
Shoulder Joint Replacement Shoulder replacement (arthroplasty) is a procedure that may be recommended if joint disease makes your shoulder stiff and painful, or if the upper arm bone is badly damaged from an accident. The shoulder is a ball-and-socket joint that allows for a wide range of motion. The head of the upper arm bone (humerus) is the ball, and a circular depression (glenoid) in the shoulder bone (scapula) is the socket. A soft-tissue rim (labrum) surrounds and deepens the socket. The head of the upper arm bone is coated with a smooth, durable covering called cartilage, and the joint has a thin, inner lining (synovium) for smooth movement. The surrounding muscles and tendons provide stability and support. IMPLANT DESIGN AND CONSTRUCTION  Shoulder replacement surgery replaces damaged surfaces with artificial parts (prostheses). Usually, there are two parts used to replace this joint.  The humeral component replaces the head of the upper arm bone. It is made of metal (usually cobalt/chromium-based alloys). This is a rounded ball attached to a stem that fits into the humerus bone. This part comes in various sizes and can be a single piece or a modular unit.   The glenoid component replaces the socket (the glenoid depression). It is made of ultrahigh density polyethelene. Some versions have a metal tray, but totally plastic versions are more common.  Depending on the damage to your shoulder, the surgeon may replace just the humeral head (a hemiarthroplasty) or both the humeral head and the glenoid (total shoulder replacement). The shoulder parts come in various sizes and shapes to fit the patient. They are held in place with either bone cement (cemented) or bone ingrowth (cementless).  The surrounding muscles and tendons hold the prosthesis parts in place, the same as the original shoulder. Each case is individual and your surgeon will study your situation carefully before making any decisions. Ask  what type of implant will be used in you and why that choice is appropriate for you. RISKS AND COMPLICATIONS  Complications after shoulder replacement surgery occur less often than with other joint replacement surgeries. However, there are risks. The most common complications are:  Infection.   Upper arm bone fracture that occurs during surgery (intraoperative fracture) or postoperative fractures.   Postoperative instability.   Loosening of the glenoid component over time.  Advances in surgical techniques and prosthetic devices are helping to lessen the chances of complications.  PROCEDURE   Either regional (numb in the shoulder area) or general (sleep during the procedure) anesthesia may be used during shoulder replacement surgery. Your caregiver or anesthesiologist will advise you on the best type of anesthesia for you.   The surgical cut (incision) is 4" to 6" (10cm to 15cm) long and is made on the front of the shoulder from the collarbone (clavicle) to the point where the shoulder muscle (deltoid) attaches to the upper arm bone. The surgeon will take care not to injure the nerves or blood vessels that cross the shoulder.   The upper arm bone is dislocated from the socket to expose the ball-like end of the upper arm. Only the portion of the bone covered by cartilage is removed. Articular cartilage covers the ends of bones where they meet the ends of other bones.   The center cavity of the humerus bone is cleaned and enlarged with reamers to create a hollow area that matches the shape of the implant stem. The top end of the bone is smoothed so the stem can be inserted flush with the bone surface.  If the ball of the prosthesis is a separate piece, the proper size is selected and attached.   If the socket portion of the joint is basically healthy and the surrounding muscles are intact, the surgeon may decide not to replace it. However, if the socket is arthritic, the upper arm bone is moved  to the back and the surgeon will implant the glenoid component. The surgeon prepares the socket surface by removing the remaining damaged cartilage. The socket bone is then gently reamed to match the implant. Protrusions on the artifical socket part are then fitted into holes drilled in the bone surface. Once the part fits it is cemented into position.   The arm bone, with its new artificial head, is replaced in the socket. The surgeon reattaches the supporting tendons and closes the incision.   The arm is placed in a sling and a support pillow is placed under the elbow to protect the repair.   Tubes are placed to remove excess drainage. These are usually removed a day or two later.  REHABILITATION AFTER SURGERY A rehabilitation program is important to the success of the operation. If the surgery is scheduled for the morning, therapy can begin later that day, and no later than the first day after the procedure. A physical therapist will start gentle range of motion exercises. In these, your arm is gently put through all its motions. Before you leave the hospital (usually two or three days after surgery), your therapist will show you in how to use a pulley device to help bend and extend your arm and will give you directions for other home exercises. HOME CARE INSTRUCTIONS  You may resume normal diet and activities as directed or allowed. Wear the sling every night for at least the first month, or as instructed by your surgeon.   Do not use your arm to push yourself up in bed or from a chair. This requires too much force on the surgically repaired muscles.   Follow the program of home exercises suggested. Do the exercises 4 to 5 times a day for a month or as directed.   Try not to overuse your shoulder. It is easy to do if this is the first time you have been pain free in a long time. Early overuse of the shoulder may result in later problems.   Do not lift anything heavier than a cup of coffee for  the first 6 weeks after surgery.   Ask for help at home. Your caregiver may be able to suggest an agency for this if you do not have home support.   Do not participate in contact sports or do any heavy lifting (more than 10 pounds) for at least 6 months, or as directed.   Keep ice packs (a bag of ice wrapped in a towel) on the surgical area for 15 to 20 minutes, 3 to 4 times per day, for the first two days following surgery.   Change dressings if necessary or as directed. Shower and get the wound wet as directed.   Only take over-the-counter or prescription medicines for pain, discomfort, or fever as directed by your caregiver.   Follow the directions of your surgeon.   Keep appointments as directed.  Document Released: 05/19/2003 Document Revised: 08/09/2011 Document Reviewed: 08/10/2008 [facility] Patient Information 2012 [place], [place].

## 2012-02-05 NOTE — Anesthesia Postprocedure Evaluation (Signed)
  Anesthesia Post-op Note  Patient: [name] Elliott  Procedure(s) Performed: Procedure(s) (LRB): HARDWARE REMOVAL (Left) REVERSE SHOULDER ARTHROPLASTY (Left)  Patient Location: PACU  Anesthesia Type: GA combined with regional for post-op pain  Level of Consciousness: awake, alert , oriented and patient cooperative  Airway and Oxygen Therapy: Patient Spontanous Breathing and Patient connected to nasal cannula oxygen  Post-op Pain: none  Post-op Assessment: Post-op Vital signs reviewed, Patient's Cardiovascular Status Stable, Respiratory Function Stable, Patent Airway, No signs of Nausea or vomiting and Pain level controlled  Post-op Vital Signs: stable  Complications: No apparent anesthesia complications

## 2012-02-05 NOTE — Anesthesia Procedure Notes (Signed)
Anesthesia Regional Block:  Interscalene brachial plexus block  Pre-Anesthetic Checklist: ,, timeout performed, Correct Patient, Correct Site, Correct Laterality, Correct Procedure, Correct Position, site marked, Risks and benefits discussed,  Surgical consent,  Pre-op evaluation,  At surgeon's request and post-op pain management  Laterality: Left  Prep: Maximum Sterile Barrier Precautions used, chloraprep and alcohol swabs       Needles:  Injection technique: Single-shot  Needle Type: Stimulator Needle - 40        Needle insertion depth: 5 cm   Additional Needles:  Procedures: nerve stimulator Interscalene brachial plexus block  Nerve Stimulator or Paresthesia:  Response: 0.5 mA, 0.1 ms, 5 cm  Additional Responses:   Narrative:  Start time: 02/05/2012 7:05 AM End time: 02/05/2012 7:10 AM Injection made incrementally with aspirations every 5 mL.  Performed by: Personally  Anesthesiologist: Maren Beach MD  Additional Notes: 20cc 0.5% Marcaine w/ epi w/o difficulty or discomfort GES  Interscalene brachial plexus block

## 2012-02-05 NOTE — H&P (Signed)
PREOPERATIVE H&P  Chief Complaint: FAILED LEFT SHOULDER hemiarthroplasty  HPI: [name] Elliott is a 78 y.o. male who presents for preoperative history and physical with a diagnosis of FAILED LEFT SHOULDER hemiarthroplasty. He previously had a fracture, and had a hemiarthroplasty which caused ongoing severe pain with very poor function. Symptoms are rated as moderate to severe, and have been worsening.  This is significantly impairing activities of daily living.  He has elected for surgical management.   Past Medical History  Diagnosis Date  . Constipation, chronic   . Sebaceous cyst   . Other malaise and fatigue   . Pain in joint, shoulder region     Has chronic shoulder pain  . Persistent disorder of initiating or maintaining sleep   . Pain in limb   . Thrombocytopenia, unspecified   . Dementia     with periods of amnesia  . Other B-complex deficiencies   . Hypothyroidism   . Pain in limb   . Esophageal reflux   . Unspecified essential hypertension   . Arthropathy, unspecified, site unspecified   . Pure hypercholesterolemia   . Pneumonia 2011  . Orthostasis   . PVC's (premature ventricular contractions)   . S/P cardiac cath 08/08/11    mild to moderate CAD primarily in the LAD. None are obstructive and appear stable from prior cath in 2007; managed medically  . Arthritis   . CAD (coronary artery disease)     last cath in 2012. Managed medically-some blockages  . Headache    Past Surgical History  Procedure Date  . Hand surgery     Right crush injury, right fifth digit contracture, limited  motion  . Lumbar spine surgery     2007, Dr Eloise Harman (Neurosurgeon)  . Knee surgery     age 63, x2  . Cataract extraction 2009  . Rotator cuff repair 8.30.2011  . US echocardiography 02-28-2010    Est EF 50-55%  . Cardiac catheterization     08/2011  . Shoulder hemi-arthroplasty   . Finger amputation     pinky finger right hand   History   Social History  . Marital Status:  Widowed    Spouse Name: N/A    Number of Children: 2  . Years of Education: N/A   Occupational History  . retired    Social History Main Topics  . Smoking status: Never Smoker   . Smokeless tobacco: Current User    Types: Chew   Comment: tobacco info given 10/10/2011  . Alcohol Use: No  . Drug Use: No  . Sexually Active: None   Other Topics Concern  . None   Social History Narrative   2nd grade educationMarried '58, '952 dtr '59, '64, youngest daughter died septic kidneyLives alone, handles all adlsRegular Exercise -  NODaily caffine   Family History  Problem Relation Age of Onset  . Breast cancer Mother   . Cancer Brother     throat  . Diabetes Daughter   . Colon cancer Neg Hx   . Kidney disease Father    Allergies  Allergen Reactions  . Crestor (Rosuvastatin Calcium)     Intolerance,  Back pain   Prior to Admission medications   Medication Sig Start Date End Date Taking? Authorizing Provider  alendronate (FOSAMAX) 70 MG tablet Take 1 tablet (70 mg total) by mouth every 7 (seven) days. Take with a full glass of water on an empty stomach. 06/04/11 06/03/12 Yes Jacques Navy, MD  aspirin 81 MG EC  tablet Take 81 mg by mouth daily.     Yes Historical Provider, MD  furosemide (LASIX) 40 MG tablet Take 1 tablet (40 mg total) by mouth 2 (two) times daily. 08/17/11 08/16/12 Yes Rhonda G Barrett, PA  gabapentin (NEURONTIN) 300 MG capsule Take 1 capsule (300 mg total) by mouth 2 (two) times daily. 12/25/11  Yes Jacques Navy, MD  isosorbide mononitrate (IMDUR) 60 MG 24 hr tablet Take 1 tablet (60 mg total) by mouth every morning. 05/15/11 05/14/12 Yes Rosalio Macadamia, NP  omeprazole (PRILOSEC) 20 MG capsule Take 2 capsules (40 mg total) by mouth daily. 08/16/11  Yes Jacques Navy, MD  potassium chloride SA (K-DUR,KLOR-CON) 20 MEQ tablet Take 1 tablet (20 mEq total) by mouth 2 (two) times daily. 08/13/11  Yes [name], MD  nitroGLYCERIN (NITROSTAT) 0.4 MG SL tablet Place  1 tablet (0.4 mg total) under the tongue every 5 (five) minutes as needed for chest pain. 05/31/11 05/30/12  [name], MD     Positive ROS: All other systems have been reviewed and were otherwise negative with the exception of those mentioned in the HPI and as above.  Physical Exam: General: Alert, no acute distress Cardiovascular: No pedal edema Respiratory: No cyanosis, no use of accessory musculature GI: No organomegaly, abdomen is soft and non-tender Skin: No lesions in the area of chief complaint Neurologic: Sensation intact distally Psychiatric: Patient is competent for consent with normal mood and affect Lymphatic: No axillary or cervical lymphadenopathy  MUSCULOSKELETAL: Left shoulder active forward flexion is 0-30 with substantial pain. Sensation is intact throughout the hand and arm. All fingers flex extend and abduct.  Assessment: FAILED LEFT SHOULDER hemiarthroplasty  Plan: Plan for Procedure(s): HARDWARE REMOVAL, previous hemiarthroplasty stem, with REVERSE SHOULDER ARTHROPLASTY  The risks benefits and alternatives were discussed with the patient including but not limited to the risks of nonoperative treatment, versus surgical intervention including infection, bleeding, nerve injury,  blood clots, cardiopulmonary complications, morbidity, mortality, among others, and they were willing to proceed.   Eulas Post, MD 02/05/2012 7:23 AM

## 2012-02-05 NOTE — Anesthesia Preprocedure Evaluation (Addendum)
Anesthesia Evaluation  Patient identified by MRN, date of birth, ID band Patient awake    Reviewed: Allergy & Precautions, H&P , NPO status , Patient's Chart, lab work & pertinent test results  History of Anesthesia Complications Negative for: history of anesthetic complications  Airway Mallampati: I TM Distance: >3 FB Neck ROM: full    Dental  (+) Edentulous Upper, Edentulous Lower and Dental Advisory Given   Pulmonary pneumonia ,          Cardiovascular hypertension, Pt. on medications + CAD + dysrhythmias Rhythm:regular Rate:Normal     Neuro/Psych  Headaches, PSYCHIATRIC DISORDERS dementia    GI/Hepatic Neg liver ROS, GERD-  Medicated and Controlled,  Endo/Other  Hypothyroidism   Renal/GU negative Renal ROS  negative genitourinary   Musculoskeletal negative musculoskeletal ROS (+)   Abdominal   Peds  Hematology negative hematology ROS (+)   Anesthesia Other Findings   Reproductive/Obstetrics negative OB ROS                        Anesthesia Physical Anesthesia Plan  ASA: III  Anesthesia Plan: General   Post-op Pain Management:    Induction: Intravenous  Airway Management Planned: Oral ETT  Additional Equipment:   Intra-op Plan:   Post-operative Plan: Extubation in OR  Informed Consent: I have reviewed the patients History and Physical, chart, labs and discussed the procedure including the risks, benefits and alternatives for the proposed anesthesia with the patient or authorized representative who has indicated his/her understanding and acceptance.     Plan Discussed with: Anesthesiologist, CRNA and Surgeon  Anesthesia Plan Comments:         Anesthesia Quick Evaluation

## 2012-02-05 NOTE — Progress Notes (Signed)
Orthopedic Tech Progress Note Patient Details:  [name] Elliott 10-05-1927 [id number]  Patient ID: [name], male   DOB: June 01, 1928, 78 y.o.   MRN: [id number] Confirmed patient has sling immobilizer.  Daiden Coltrane T 02/05/2012, 1:55 PM

## 2012-02-06 ENCOUNTER — Encounter (HOSPITAL_COMMUNITY): Payer: Self-pay | Admitting: Orthopedic Surgery

## 2012-02-06 LAB — BASIC METABOLIC PANEL
BUN: 14 mg/dL (ref 6–23)
Calcium: 8.7 mg/dL (ref 8.4–10.5)
Creatinine, Ser: 1.2 mg/dL (ref 0.50–1.35)
GFR calc Af Amer: 64 mL/min — ABNORMAL LOW (ref >90)
GFR calc non Af Amer: 55 mL/min — ABNORMAL LOW (ref >90)
Glucose, Bld: 127 mg/dL — ABNORMAL HIGH (ref 70–99)
Potassium: 3.7 mEq/L (ref 3.5–5.1)

## 2012-02-06 LAB — CBC
HCT: 39.8 % (ref 39.0–52.0)
Hemoglobin: 13.4 g/dL (ref 13.0–17.0)
MCH: 30.2 pg (ref 26.0–34.0)
MCHC: 33.7 g/dL (ref 30.0–36.0)
MCV: 89.6 fL (ref 78.0–100.0)
RDW: 14.2 % (ref 11.5–15.5)

## 2012-02-06 NOTE — Progress Notes (Signed)
Orthopedic Tech Progress Note Patient Details:  [name] Elliott [birth year] [id number]  Patient ID: [name], male   DOB: May 23, 1928, 78 y.o.   MRN: [id number]   Shawnie Pons 02/06/2012, 8:51 AM Trapeze bar

## 2012-02-06 NOTE — Clinical Social Work Psychosocial (Signed)
     Clinical Social Work Department BRIEF PSYCHOSOCIAL ASSESSMENT 02/06/2012  Patient:  [name] Elliott, [name] Elliott     Account Number:  [id number]     Admit date:  02/05/2012  Clinical Social Worker:  Burnard Hawthorne  Date/Time:  02/06/2012 01:25 PM  Referred by:  Physician  Date Referred:  02/06/2012 Referred for  SNF Placement   Other Referral:   Interview type:  Patient Other interview type:    PSYCHOSOCIAL DATA Living Status:  ALONE Admitted from facility:   Level of care:   Primary support name:  [name] Elliott- Daughter [phone] Primary support relationship to patient:  CHILD, ADULT Degree of support available:   Strong    CURRENT CONCERNS Current Concerns  Post-Acute Placement   Other Concerns:    SOCIAL WORK ASSESSMENT / PLAN CSW received referral to assist with d/c planning. Chart reviewed and met with patient who stated that he lives alone and is normally self-sufficient of his ADL's. He stated that his MD stated that he woudl be going to [facility] for short term rehab; patient states he has been there before in the past. Notified Leisure centre manager at [facility] and will intiate referral.   Assessment/plan status:  Psychosocial Support/Ongoing Assessment of Needs Other assessment/ plan:   Information/referral to community resources:   Patient has [employer]. Will request prior auth.    PATIENTS/FAMILYS RESPONSE TO PLAN OF CARE: Patient is agreeable to short term stay but is anxious to return home.  Daughter [name] Elliott is also agreeable to SNF plan.

## 2012-02-06 NOTE — Progress Notes (Signed)
Occupational Therapy Evaluation Patient Details Name: [name] Elliott MRN: [id number] DOB: 05/09/1928 Today's Date: 02/06/2012 Time: 1450-1520 OT Time Calculation (min): 30 min  OT Assessment / Plan / Recommendation Clinical Impression  78 yo s/p L reverse total shoulder. Pt lives alone, but is unable to D/C home alone safely. Recommend skilled OT services to max independence with ADL and functional mobility for ADL to facilitate D/Cto SNF for rehab.PT states that he would like to go home, but explained to pt that he is not able to use his L arm to assist as needed to live independently safely.    OT Assessment  Patient needs continued OT Services    Follow Up Recommendations  Skilled nursing facility    Barriers to Discharge Decreased caregiver support    Equipment Recommendations  Defer to next venue    Recommendations for Other Services  none  Frequency  Min 2X/week    Precautions / Restrictions Precautions Precautions: Shoulder Type of Shoulder Precautions: ROM to elbow/wrist/hand Precaution Booklet Issued: Yes (comment) Required Braces or Orthoses: Other Brace/Splint (sling) Restrictions Weight Bearing Restrictions: Yes LUE Weight Bearing: Non weight bearing   Pertinent Vitals/Pain 5. Repositioned. ice    ADL  Eating/Feeding: Simulated;Set up Where Assessed - Eating/Feeding: Chair Grooming: Simulated;Moderate assistance Where Assessed - Grooming: Unsupported sitting Upper Body Bathing: Simulated;Moderate assistance Where Assessed - Upper Body Bathing: Supported sit to stand Lower Body Bathing: Simulated;Moderate assistance Where Assessed - Lower Body Bathing: Supported sit to stand Upper Body Dressing: Simulated;Maximal assistance Where Assessed - Upper Body Dressing: Supported sitting Lower Body Dressing: Simulated;Moderate assistance Where Assessed - Lower Body Dressing: Sopported sit to stand Toilet Transfer: Simulated;Minimal assistance Toilet Transfer Method:  Sit to stand Toilet Transfer Equipment: Regular height toilet Toileting - Clothing Manipulation and Hygiene: Simulated;Minimal assistance Where Assessed - Toileting Clothing Manipulation and Hygiene: Standing Transfers/Ambulation Related to ADLs: Mod A. unsteady gait. ADL Comments: Pt required mod vc to not use L UE during eval. Repetitive cues for nonuse.    OT Diagnosis: Generalized weakness;Acute pain  OT Problem List: Decreased strength;Decreased range of motion;Decreased activity tolerance;Impaired balance (sitting and/or standing);Decreased cognition;Decreased knowledge of precautions;Pain;Impaired UE functional use OT Treatment Interventions: Self-care/ADL training;Therapeutic exercise;Therapeutic activities;Patient/family education;Balance training   OT Goals Acute Rehab OT Goals OT Goal Formulation: With patient Time For Goal Achievement: 02/13/12 Potential to Achieve Goals: Good ADL Goals Pt Will Perform Upper Body Bathing: with min assist;Supported;with cueing (comment type and amount) ADL Goal: Upper Body Bathing - Progress: Goal set today Pt Will Perform Upper Body Dressing: with min assist;Supported;with cueing (comment type and amount) ADL Goal: Upper Body Dressing - Progress: Goal set today Pt Will Transfer to Toilet: with supervision;Ambulation;Maintaining weight bearing status;with cueing (comment type and amount) ADL Goal: Toilet Transfer - Progress: Goal set today Arm Goals Additional Arm Goal #1: pt will complete L elbow/wrist and hand ROM with min vc only. Arm Goal: Additional Goal #1 - Progress: Goal set today Additional Arm Goal #2: pt will complete management of LUE in sling with min A. Arm Goal: Additional Goal #2 - Progress: Goal set today  Visit Information  Last OT Received On: 02/06/12 Assistance Needed: +1    Subjective Data   I can just drive to rehab   Prior Functioning  Home Living Lives With: Alone Available Help at Discharge:  (none) Type of  Home: House Home Access: Stairs to enter [employer] of Steps: 3 Entrance Stairs-Rails: None Home Layout: One level Bathroom Shower/Tub: Engineer, manufacturing systems: Handicapped height  Home Adaptive Equipment: Straight cane;Walker - rolling;Shower chair with back Prior Function Level of Independence: Independent Able to Take Stairs?: Yes Driving: Yes Vocation: Retired Musician: No difficulties Dominant Hand: Right    Cognition  Overall Cognitive Status: Impaired Area of Impairment: Memory;Safety/judgement;Awareness of errors;Awareness of deficits;Problem solving Arousal/Alertness: Awake/alert Orientation Level: Oriented X4 / Intact Behavior During Session: [facility] for tasks performed Memory: Decreased recall of precautions Safety/Judgement: Decreased awareness of safety precautions;Decreased safety judgement for tasks assessed;Decreased awareness of need for assistance;Impulsive Awareness of Errors: Assistance required to identify errors made    Extremity/Trunk Assessment Right Upper Extremity Assessment RUE ROM/Strength/Tone: [facility] for tasks assessed RUE Sensation: WFL - Light Touch;WFL - Proprioception RUE Coordination: WFL - gross/fine motor Left Upper Extremity Assessment LUE ROM/Strength/Tone: Deficits;Due to precautions;Due to pain LUE ROM/Strength/Tone Deficits: pt allowed to ROM to wrist/elbow/hand, no ROM to shoulder LUE Sensation: WFL - Light Touch;WFL - Proprioception LUE Coordination: Deficits   Mobility Transfers Transfers: Sit to Stand;Stand to Sit Sit to Stand: 3: Mod assist;From chair/3-in-1;With upper extremity assist Stand to Sit: 4: Min assist;To chair/3-in-1 Details for Transfer Assistance: uncontrolled descent. cues for safety   Exercise General Exercises - Upper Extremity Elbow Flexion: AROM;AAROM;Left;10 reps;Seated Elbow Extension: AROM;Left;AAROM;10 reps;Seated Wrist Flexion: AROM;Left;5 reps;Seated Wrist Extension:  AROM;Left;5 reps;Seated Digit Composite Flexion: AROM;Left;Seated;10 reps Composite Extension: AROM;Left;10 reps;Seated  Balance  poor  End of Session OT - End of Session Equipment Utilized During Treatment: Gait belt Activity Tolerance: Patient tolerated treatment well Patient left: in chair;with call bell/phone within reach Nurse Communication: Mobility status   Lancer Thurner,HILLARY 02/06/2012, 4:58 PM [facility], OTR/L  [phone] 02/06/2012

## 2012-02-06 NOTE — Progress Notes (Signed)
CARE MANAGEMENT NOTE 02/06/2012  Patient:  [name] Elliott, [name] Elliott   Account Number:  [id number]  Date Initiated:  02/06/2012  Documentation initiated by:  Vance Peper  Subjective/Objective Assessment:   78 yr old male s/p left reverse shoulder arthroplasty.     Action/Plan:   Patient is for shortterm rehab at [facility]. Social Lorrin Mais is aware.   Anticipated DC Date:  02/08/2012   Anticipated DC Plan:  SKILLED NURSING FACILITY      DC Planning Services  CM consult      Choice offered to / List presented to:             Status of service:  Completed, signed off Discharge Disposition:  SKILLED NURSING FACILITY

## 2012-02-06 NOTE — Clinical Social Work Placement (Addendum)
    Clinical Social Work Department CLINICAL SOCIAL WORK PLACEMENT NOTE 02/06/2012  Patient:  [name] Elliott, [name] Elliott  Account Number:  [id number] Admit date:  02/05/2012  Clinical Social Worker:  Lupita Leash Paeton Latouche, LCSWA  Date/time:  02/06/2012 01:36 PM  Clinical Social Work is seeking post-discharge placement for this patient at the following level of care:   SKILLED NURSING   (*CSW will update this form in Epic as items are completed)   02/06/2012  Patient/family provided with [facility] Department of Clinical Social Work's list of facilities offering this level of care within the geographic area requested by the patient (or if unable, by the patient's family).  02/06/2012  Patient/family informed of their freedom to choose among providers that offer the needed level of care, that participate in Medicare, Medicaid or managed care program needed by the patient, have an available bed and are willing to accept the patient.  02/06/2012  Patient/family informed of [facility]' ownership interest in [facility], as well as of the fact that they are under no obligation to receive care at this facility.  PASARR submitted to EDS on 02/06/2012 PASARR number received from EDS on 02/06/2012  FL2 transmitted to all facilities in geographic area requested by pt/family on  02/06/2012 FL2 transmitted to all facilities within larger geographic area on NA  Patient informed that his/her managed care company has contracts with or will negotiate with  certain facilities, including the following:   Patient has [employer]. He has been a resident at [facility] in the past. PASARR number in place.     Patient/family informed of bed offers received:  02/06/2012 Patient chooses bed at [facility] Physician recommends and patient chooses bed at  NA  Patient to be transferred to [facility] on [id number]  Patient to be transferred to facility by car with daughter (per daughter's request)  The following  physician request were entered in Epic:   Additional Comments: Patient is agreeable to d/c plan. Message left for patient's daughter Agustin Cree regarding scheduled d/c with request to call back to confirm.  Daughter wants to transport as she feels that her father would be upset if he went by EMS.  Notified pt's nurse and SNF of above. Daughter to sign admit papers with SNF staff at 2 pm- then will transport patient.   Lorri Frederick. [place] [phone]

## 2012-02-06 NOTE — Progress Notes (Signed)
     Subjective:  Patient reports pain as moderate.  No other complaints.  Objective:   VITALS:   Filed Vitals:   02/06/12 0018 02/06/12 0207 02/06/12 0600 02/06/12 1422  BP:  115/59 123/61 101/63  Pulse:  108 88 79  Temp: 98.1 F (36.7 C) 98.7 F (37.1 C) 98.9 F (37.2 C) 98.9 F (37.2 C)  TempSrc:  Oral Oral   Resp:  20 20 18   Height:      Weight:      SpO2:  97% 100% 97%    Neurologically intact Sensation intact distally Dorsiflexion/Plantar flexion intact Incision: dressing C/D/I  LABS  Results for orders placed during the hospital encounter of 02/05/12 (from the past 24 hour(s))  CBC     Status: Abnormal   Collection Time   02/06/12  5:27 AM      Component Value Range   WBC 8.5  4.0 - 10.5 (K/uL)   RBC 4.44  4.22 - 5.81 (MIL/uL)   Hemoglobin 13.4  13.0 - 17.0 (g/dL)   HCT 16.1  09.6 - 04.5 (%)   MCV 89.6  78.0 - 100.0 (fL)   MCH 30.2  26.0 - 34.0 (pg)   MCHC 33.7  30.0 - 36.0 (g/dL)   RDW 40.9  81.1 - 91.4 (%)   Platelets 136 (*) 150 - 400 (K/uL)  BASIC METABOLIC PANEL     Status: Abnormal   Collection Time   02/06/12  5:27 AM      Component Value Range   Sodium 139  135 - 145 (mEq/L)   Potassium 3.7  3.5 - 5.1 (mEq/L)   Chloride 102  96 - 112 (mEq/L)   CO2 25  19 - 32 (mEq/L)   Glucose, Bld 127 (*) 70 - 99 (mg/dL)   BUN 14  6 - 23 (mg/dL)   Creatinine, Ser 7.82  0.50 - 1.35 (mg/dL)   Calcium 8.7  8.4 - 95.6 (mg/dL)   GFR calc non Af Amer 55 (*) >90 (mL/min)   GFR calc Af Amer 64 (*) >90 (mL/min)    Dg Shoulder Left  02/05/2012  *RADIOLOGY REPORT*  Clinical Data: Postop  LEFT SHOULDER - 2+ VIEW  Comparison: 09/19/2010  Findings: The patient has undergone removal of left shoulder hemiarthroplasty and undergone replacement with a reverse total shoulder construct. On this single AP portable view, the glenoid sphere and humeral stem appear satisfactory positioned.  IMPRESSION: As above.  Original Report Authenticated By: Elsie Stain, M.D.     Assessment/Plan: 1 Day Post-Op   Principal Problem:  *Failed arthroplasty, shoulder   Advance diet Up with therapy Discharge to SNF   Kaliel Bolds P 02/06/2012, 6:54 PM   Teryl Lucy, MD [phone] pager

## 2012-02-06 NOTE — Progress Notes (Signed)
Physical Therapy Evaluation Note  Past Medical History  Diagnosis Date  . Constipation, chronic   . Sebaceous cyst   . Other malaise and fatigue   . Pain in joint, shoulder region     Has chronic shoulder pain  . Persistent disorder of initiating or maintaining sleep   . Pain in limb   . Thrombocytopenia, unspecified   . Dementia     with periods of amnesia  . Other B-complex deficiencies   . Hypothyroidism   . Pain in limb   . Esophageal reflux   . Unspecified essential hypertension   . Arthropathy, unspecified, site unspecified   . Pure hypercholesterolemia   . Pneumonia 2011  . Orthostasis   . PVC's (premature ventricular contractions)   . S/P cardiac cath 08/08/11    mild to moderate CAD primarily in the LAD. None are obstructive and appear stable from prior cath in 2007; managed medically  . Arthritis   . CAD (coronary artery disease)     last cath in 2012. Managed medically-some blockages  . Headache   . 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 MRI Nov '11 - tendonosis and partial tear. Left shoulder surgery Jan '12 for partial shoulder replacement. [name])     Past Surgical History  Procedure Date  . Hand surgery     Right crush injury, right fifth digit contracture, limited  motion  . Lumbar spine surgery     2007, Dr Eloise Harman (Neurosurgeon)  . Knee surgery     age 19, x2  . Cataract extraction 2009  . Rotator cuff repair 8.30.2011  . US echocardiography 02-28-2010    Est EF 50-55%  . Cardiac catheterization     08/2011  . Shoulder hemi-arthroplasty   . Finger amputation     pinky finger right hand     02/06/12 1055  PT Visit Information  Last PT Received On 02/06/12  Assistance Needed +1  PT Time Calculation  PT Start Time 1055  PT Stop Time 1115  PT Time Calculation (min) 20 min  Subjective Data  Subjective Pt received supine in bed with minimal L shld pain.  Precautions  Precautions Shoulder  Type of Shoulder  Precautions ROM to elbow/wrist/hand  Required Braces or Orthoses (sling to L UE at all times)  Restrictions  Weight Bearing Restrictions Yes  LUE Weight Bearing NWB  Home Living  Lives With Alone  Available Help at Discharge (none)  Type of Home House  Home Access Stairs to enter  Entrance Stairs-Number of Steps 3  Entrance Stairs-Rails None  Home Layout One level  Bathroom Shower/Tub Tub/shower unit  Bathroom Toilet Handicapped height  Home Adaptive Equipment Straight cane;Walker - rolling;Shower chair with back  Prior Function  Level of Independence Independent  Able to Take Stairs? Yes  Driving Yes  Vocation Retired  Geneticist, molecular No difficulties  Cognition  Overall Cognitive Status Impaired  Area of Impairment Safety/judgement;Awareness of deficits  Arousal/Alertness Awake/alert  Orientation Level Oriented X4 / Intact  Behavior During Session [facility] for tasks performed  Safety/Judgement Decreased awareness of safety precautions;Decreased safety judgement for tasks assessed;Decreased awareness of need for assistance;Impulsive  Awareness of Deficits decreased  Right Upper Extremity Assessment  RUE ROM/Strength/Tone WFL  Left Upper Extremity Assessment  LUE ROM/Strength/Tone Deficits  LUE ROM/Strength/Tone Deficits pt allowed to ROM to wrist/elbow/hand, no ROM to shoulder  Right Lower Extremity Assessment  RLE ROM/Strength/Tone [facility]  Left Lower Extremity Assessment  LLE ROM/Strength/Tone [facility] for tasks  assessed  Trunk Assessment  Trunk Assessment Normal  Bed Mobility  Bed Mobility Supine to Sit  Supine to Sit 4: Min guard;HOB elevated;With rails  Details for Bed Mobility Assistance pt used hand rail with R  UE  Transfers  Transfers Sit to Stand;Stand to Sit  Sit to Stand 4: Min assist;From bed  Stand to Sit 4: Min assist;To chair/3-in-1  Details for Transfer Assistance mina for impulsivity  Ambulation/Gait  Ambulation/Gait Assistance 4: Min assist (via  HHA)  Ambulation Distance (Feet) 150 Feet  Assistive device (Hand held assist)  Ambulation/Gait Assistance Details pt unsteady when ambulating without device, pt requires HHA to maintain balance/stability. May trial cane tomorrow however pt impulsive with decrased safety awaressness and suspect pt to be unable to use cane safely  Gait Pattern Step-through pattern;Trunk flexed  Stairs No  Balance  Balance Assessed Yes  PT - End of Session  Equipment Utilized During Treatment Gait belt  Activity Tolerance Patient tolerated treatment well  Patient left in chair;with call bell/phone within reach;with family/visitor present  Nurse Communication Mobility status  PT Assessment  Clinical Impression Statement Pt s/p L reverse shoulder presenting with impaired balance and ability to complete ADLs due to inability to use L UE functionally. Patient to require 24/7 supervision/assist upon d/c which can not be provided by family. Patient to benefit from SNF placement to achieve maximal functional independence for safe transition home.  PT Recommendation/Assessment Patient needs continued PT services  PT Problem List Decreased activity tolerance;Decreased strength;Decreased range of motion;Decreased balance;Decreased mobility  Barriers to Discharge Decreased caregiver support (pt lives alone)  PT Therapy Diagnosis  Difficulty walking;Abnormality of gait;Generalized weakness;Acute pain  PT Plan  PT Frequency Min 3X/week  PT Treatment/Interventions DME instruction;Gait training;Therapeutic activities;Therapeutic exercise  PT Recommendation  Follow Up Recommendations Skilled nursing facility  Equipment Recommended Defer to next venue  Individuals Consulted  Consulted and Agree with Results and Recommendations Patient  Acute Rehab PT Goals  PT Goal Formulation With patient  Time For Goal Achievement 02/20/12  Potential to Achieve Goals Good  Pt will go Supine/Side to Sit with modified independence;with  HOB 0 degrees  PT Goal: Supine/Side to Sit - Progress Goal set today  Pt will go Sit to Stand with modified independence  PT Goal: Sit to Stand - Progress Goal set today  Pt will Transfer Bed to Chair/Chair to Bed with modified independence (with least restrictive device)  PT Transfer Goal: Bed to Chair/Chair to Bed - Progress Goal set today  Pt will Ambulate >150 feet;with modified independence;with least restrictive assistive device  PT Goal: Ambulate - Progress Goal set today  Written Expression  Dominant Hand Right    Pain: Pt denies pain in L UE but reports just having pain medicine  Lewis Shock, PT, DPT Pager #: [phone] Office #: [phone]

## 2012-02-07 NOTE — Discharge Summary (Signed)
Physician Discharge Summary  Patient ID: [name] Elliott MRN: [id number] DOB/AGE: [id number] 76 y.o.  Admit date: 02/05/2012 Discharge date: 02/07/2012  Admission Diagnoses:  Failed arthroplasty  Discharge Diagnoses:  Principal Problem:  *Failed arthroplasty, shoulder   Past Medical History  Diagnosis Date  . Constipation, chronic   . Sebaceous cyst   . Other malaise and fatigue   . Pain in joint, shoulder region     Has chronic shoulder pain  . Persistent disorder of initiating or maintaining sleep   . Pain in limb   . Thrombocytopenia, unspecified   . Dementia     with periods of amnesia  . Other B-complex deficiencies   . Hypothyroidism   . Pain in limb   . Esophageal reflux   . Unspecified essential hypertension   . Arthropathy, unspecified, site unspecified   . Pure hypercholesterolemia   . Pneumonia 2011  . Orthostasis   . PVC's (premature ventricular contractions)   . S/P cardiac cath 08/08/11    mild to moderate CAD primarily in the LAD. None are obstructive and appear stable from prior cath in 2007; managed medically  . Arthritis   . CAD (coronary artery disease)     last cath in 2012. Managed medically-some blockages  . Headache   . 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 MRI Nov '11 - tendonosis and partial tear. Left shoulder surgery Jan '12 for partial shoulder replacement. [name])     Surgeries: Procedure(s): HARDWARE REMOVAL REVERSE SHOULDER ARTHROPLASTY on 02/05/2012   Consultants (if any):    Discharged Condition: Improved  Hospital Course: [name] Elliott is an 78 y.o. male who was admitted 02/05/2012 with a diagnosis of Failed arthroplasty and went to the operating room on 02/05/2012 and underwent the above named procedures.    He was given perioperative antibiotics:  Anti-infectives     Start     Dose/Rate Route Frequency Ordered Stop   02/05/12 1730   ceFAZolin (ANCEF) IVPB 1 g/50 mL premix        1 g 100 mL/hr over  30 Minutes Intravenous Every 6 hours 02/05/12 1308 02/06/12 0616   02/04/12 1403   ceFAZolin (ANCEF) IVPB 1 g/50 mL premix  Status:  Discontinued        1 g 100 mL/hr over 30 Minutes Intravenous 60 min pre-op 02/04/12 1403 02/05/12 1241   02/04/12 1240   ceFAZolin (ANCEF) IVPB 1 g/50 mL premix  Status:  Discontinued        1 g 100 mL/hr over 30 Minutes Intravenous 60 min pre-op 02/04/12 1240 02/05/12 1241        .  He was given sequential compression devices, early ambulation, and SCDsfor DVT prophylaxis.  He benefited maximally from the hospital stay and there were no complications.    Recent vital signs:  Filed Vitals:   02/07/12 0600  BP: 130/84  Pulse: 94  Temp: 98.4 F (36.9 C)  Resp: 16    Recent laboratory studies:  Lab Results  Component Value Date   HGB 13.4 02/06/2012   HGB 15.3 01/30/2012   HGB 14.0 10/10/2011   Lab Results  Component Value Date   WBC 8.5 02/06/2012   PLT 136* 02/06/2012   Lab Results  Component Value Date   INR 1.00 01/30/2012   Lab Results  Component Value Date   NA 139 02/06/2012   K 3.7 02/06/2012   CL 102 02/06/2012   CO2 25 02/06/2012   BUN 14 02/06/2012  CREATININE 1.20 02/06/2012   GLUCOSE 127* 02/06/2012    Discharge Medications:   Medication List  As of 02/07/2012  9:13 AM   STOP taking these medications         alendronate 70 MG tablet         TAKE these medications         aspirin 81 MG EC tablet   Take 81 mg by mouth daily.      furosemide 40 MG tablet   Commonly known as: LASIX   Take 1 tablet (40 mg total) by mouth 2 (two) times daily.      gabapentin 300 MG capsule   Commonly known as: NEURONTIN   Take 1 capsule (300 mg total) by mouth 2 (two) times daily.      isosorbide mononitrate 60 MG 24 hr tablet   Commonly known as: IMDUR   Take 1 tablet (60 mg total) by mouth every morning.      methocarbamol 500 MG tablet   Commonly known as: ROBAXIN   Take 1 tablet (500 mg total) by mouth 4 (four) times daily.       nitroGLYCERIN 0.4 MG SL tablet   Commonly known as: NITROSTAT   Place 1 tablet (0.4 mg total) under the tongue every 5 (five) minutes as needed for chest pain.      omeprazole 20 MG capsule   Commonly known as: PRILOSEC   Take 2 capsules (40 mg total) by mouth daily.      oxyCODONE-acetaminophen 10-325 MG per tablet   Commonly known as: PERCOCET   Take 1-2 tablets by mouth every 6 (six) hours as needed for pain. MAXIMUM TOTAL ACETAMINOPHEN DOSE IS 4000 MG PER DAY      potassium chloride SA 20 MEQ tablet   Commonly known as: K-DUR,KLOR-CON   Take 1 tablet (20 mEq total) by mouth 2 (two) times daily.      promethazine 25 MG tablet   Commonly known as: PHENERGAN   Take 1 tablet (25 mg total) by mouth every 6 (six) hours as needed for nausea.            Diagnostic Studies: Dg Shoulder Left  02/05/2012  *RADIOLOGY REPORT*  Clinical Data: Postop  LEFT SHOULDER - 2+ VIEW  Comparison: 09/19/2010  Findings: The patient has undergone removal of left shoulder hemiarthroplasty and undergone replacement with a reverse total shoulder construct. On this single AP portable view, the glenoid sphere and humeral stem appear satisfactory positioned.  IMPRESSION: As above.  Original Report Authenticated By: Elsie Stain, M.D.    Disposition: snf  Discharge Orders    Future Appointments: Provider: Department: Dept Phone: Center:   02/18/2012 1:45 PM [name], MD [facility] [phone] None     Future Orders Please Complete By Expires   Diet general      Call MD / Call 911      Comments:   If you experience chest pain or shortness of breath, CALL 911 and be transported to the hospital emergency room.  If you develope a fever above 101 F, pus (white drainage) or increased drainage or redness at the wound, or calf pain, call your surgeon's office.   Discharge instructions      Comments:   Change dressing in 3 days and reapply fresh dressing, unless you have a splint (half cast).  If  you have a splint/cast, just leave in place until your follow-up appointment.    Keep wounds dry for 3  weeks.  Leave steri-strips in place on skin.  Do not apply lotion or anything to the wound.   Constipation Prevention      Comments:   Drink plenty of fluids.  Prune juice may be helpful.  You may use a stool softener, such as Colace (over the counter) 100 mg twice a day.  Use MiraLax (over the counter) for constipation as needed.      Follow-up Information    Follow up with Robbie Nangle P, MD in 2 weeks.   Contact information:   [facility] [address] [place] [address] [phone]           Signed: Eulas Post 02/07/2012, 9:13 AM

## 2012-02-10 LAB — ANAEROBIC CULTURE

## 2012-02-18 ENCOUNTER — Ambulatory Visit: Payer: Medicare Other | Admitting: Cardiovascular Disease

## 2012-03-07 ENCOUNTER — Telehealth: Payer: Self-pay | Admitting: Internal Medicine

## 2012-03-07 DIAGNOSIS — M129 Arthropathy, unspecified: Secondary | ICD-10-CM

## 2012-03-07 DIAGNOSIS — T84019A Broken internal joint prosthesis, unspecified site, initial encounter: Secondary | ICD-10-CM

## 2012-03-07 NOTE — Telephone Encounter (Signed)
Patient daughter [name] Elliott requesting for [facility] aid to go 3 x week for patient care for assistance with care hygiene 3 xweek. Was discharged from [facility] and thought this was already set up. Please advise daughter # [phone].

## 2012-03-08 NOTE — Telephone Encounter (Signed)
Ok for [employer] home health RN assessement and aid services. Will need OV for required face to face encounter for home health services.

## 2012-03-10 NOTE — Telephone Encounter (Signed)
Left message on daughter # voice mail of approval and need for appt.

## 2012-03-12 ENCOUNTER — Ambulatory Visit (INDEPENDENT_AMBULATORY_CARE_PROVIDER_SITE_OTHER): Payer: Medicare Other | Admitting: Internal Medicine

## 2012-03-12 ENCOUNTER — Encounter: Payer: Self-pay | Admitting: Internal Medicine

## 2012-03-12 VITALS — BP 118/78 | HR 84 | Temp 97.3°F | Resp 16 | Wt 164.0 lb

## 2012-03-12 DIAGNOSIS — Z96619 Presence of unspecified artificial shoulder joint: Secondary | ICD-10-CM

## 2012-03-12 NOTE — Assessment & Plan Note (Signed)
[name] Elliott is approximately 5 weeks out from shoulder replacement and 1 week out from SNF/rehab. He needs on-going in home PT/OT and aide services. He is unable to travel to outpatient rehab.  Referral to [place].

## 2012-03-12 NOTE — Progress Notes (Signed)
Subjective:    Patient ID: [name] Elliott, male    DOB: 15-Jun-1928, 78 y.o.   MRN: [id number]  HPI Mr. Gambrell had total left shoulder replacement June 4th and went to [facility] June 6th for 4 weeks of rehab. He has been home approximately 1 week. He has done well with his surgery. He needs to have continued physical and occupational therapy at home as well as an aide for ADLS/ home keeping. He is feeling well. He is going to the surgeon today for follow-up - steri-strips still in  Place at anterior left shoulder wound.  Past Medical History  Diagnosis Date  . Constipation, chronic   . Sebaceous cyst   . Other malaise and fatigue   . Pain in joint, shoulder region     Has chronic shoulder pain  . Persistent disorder of initiating or maintaining sleep   . Pain in limb   . Thrombocytopenia, unspecified   . Dementia     with periods of amnesia  . Other B-complex deficiencies   . Hypothyroidism   . Pain in limb   . Esophageal reflux   . Unspecified essential hypertension   . Arthropathy, unspecified, site unspecified   . Pure hypercholesterolemia   . Pneumonia 2011  . Orthostasis   . PVC's (premature ventricular contractions)   . S/P cardiac cath 08/08/11    mild to moderate CAD primarily in the LAD. None are obstructive and appear stable from prior cath in 2007; managed medically  . Arthritis   . CAD (coronary artery disease)     last cath in 2012. Managed medically-some blockages  . Headache   . 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 MRI Nov '11 - tendonosis and partial tear. Left shoulder surgery Jan '12 for partial shoulder replacement. [name])    Past Surgical History  Procedure Date  . Hand surgery     Right crush injury, right fifth digit contracture, limited  motion  . Lumbar spine surgery     2007, Dr Eloise Harman (Neurosurgeon)  . Knee surgery     age 49, x2  . Cataract extraction 2009  . Rotator cuff repair 8.30.2011  . US  echocardiography 02-28-2010    Est EF 50-55%  . Cardiac catheterization     08/2011  . Shoulder hemi-arthroplasty   . Finger amputation     pinky finger right hand  . Hardware removal 02/05/2012    Procedure: HARDWARE REMOVAL;  Surgeon: Eulas Post, MD;  Location: [facility] OR;  Service: Orthopedics;  Laterality: Left;  . Reverse shoulder arthroplasty 02/05/2012    Procedure: REVERSE SHOULDER ARTHROPLASTY;  Surgeon: Eulas Post, MD;  Location: [facility] OR;  Service: Orthopedics;  Laterality: Left;   Family History  Problem Relation Age of Onset  . Breast cancer Mother   . Cancer Brother     throat  . Diabetes Daughter   . Colon cancer Neg Hx   . Kidney disease Father    History   Social History  . Marital Status: Widowed    Spouse Name: N/A    Number of Children: 2  . Years of Education: N/A   Occupational History  . retired    Social History Main Topics  . Smoking status: Never Smoker   . Smokeless tobacco: Current User    Types: Chew   Comment: tobacco info given 10/10/2011  . Alcohol Use: No  . Drug Use: No  . Sexually Active: Not on file   Other  Topics Concern  . Not on file   Social History Narrative   2nd grade educationMarried '58, '952 dtr '59, '64, youngest daughter died septic kidneyLives alone, handles all adlsRegular Exercise -  NODaily caffine    Current Outpatient Prescriptions on File Prior to Visit  Medication Sig Dispense Refill  . aspirin 81 MG EC tablet Take 81 mg by mouth daily.        . furosemide (LASIX) 40 MG tablet Take 1 tablet (40 mg total) by mouth 2 (two) times daily.  60 tablet  11  . gabapentin (NEURONTIN) 300 MG capsule Take 1 capsule (300 mg total) by mouth 2 (two) times daily.  60 capsule  3  . isosorbide mononitrate (IMDUR) 60 MG 24 hr tablet Take 1 tablet (60 mg total) by mouth every morning.  30 tablet  11  . nitroGLYCERIN (NITROSTAT) 0.4 MG SL tablet Place 1 tablet (0.4 mg total) under the tongue every 5 (five) minutes as needed for chest  pain.  25 tablet  3  . omeprazole (PRILOSEC) 20 MG capsule Take 2 capsules (40 mg total) by mouth daily.  60 capsule  6  . potassium chloride SA (K-DUR,KLOR-CON) 20 MEQ tablet Take 1 tablet (20 mEq total) by mouth 2 (two) times daily.  60 tablet  6  . promethazine (PHENERGAN) 25 MG tablet Take 1 tablet (25 mg total) by mouth every 6 (six) hours as needed for nausea.  30 tablet  0  . DISCONTD: Hyoscyamine Sulfate (HYOMAX-DT) 0.375 MG TBCR Take 1 tab bid for 5 days, then prn abd pain  30 each  1  . DISCONTD: potassium chloride SA (K-DUR,KLOR-CON) 20 MEQ tablet Take 1 tablet (20 mEq total) by mouth 2 (two) times daily.  90 tablet  1      Review of Systems System review is negative for any constitutional, cardiac, pulmonary, GI or neuro symptoms or complaints other than as described in the HPI.     Objective:   Physical Exam Filed Vitals:   03/12/12 1141  BP: 118/78  Pulse: 84  Temp: 97.3 F (36.3 C)  Resp: 16   Wt Readings from Last 3 Encounters:  03/12/12 164 lb (74.39 kg)  02/05/12 169 lb 9.6 oz (76.93 kg)  02/05/12 169 lb 9.6 oz (76.93 kg)   Gen'l- WNWD white man in no acute distress Chest- normal respiration Cor- RRR MSK - not moving his shoulder yet per Dr. Dion Saucier. Wound looks like it is doing well.        Assessment & Plan:

## 2012-03-31 ENCOUNTER — Ambulatory Visit: Payer: Medicare Other | Admitting: Cardiovascular Disease

## 2012-04-03 ENCOUNTER — Other Ambulatory Visit: Payer: Self-pay | Admitting: *Deleted

## 2012-04-03 MED ORDER — OMEPRAZOLE 20 MG PO CPDR: 40.0000 mg | DELAYED_RELEASE_CAPSULE | Freq: Every day | ORAL | Status: DC

## 2012-04-03 NOTE — Telephone Encounter (Signed)
Refill med omeprazole to [facility]

## 2012-04-04 ENCOUNTER — Other Ambulatory Visit: Payer: Self-pay | Admitting: *Deleted

## 2012-04-04 MED ORDER — POTASSIUM CHLORIDE CRYS ER 20 MEQ PO TBCR: 20.0000 meq | EXTENDED_RELEASE_TABLET | Freq: Two times a day (BID) | ORAL | Status: DC

## 2012-04-04 NOTE — Telephone Encounter (Signed)
Fax Received. Refill Completed. Apolonia Ellwood Chowoe (R.M.A)   

## 2012-04-28 ENCOUNTER — Telehealth: Payer: Self-pay | Admitting: Internal Medicine

## 2012-04-28 NOTE — Telephone Encounter (Signed)
Ok to add to Tuesday schedule.

## 2012-04-28 NOTE — Telephone Encounter (Signed)
LM for daughter to call back and schedule

## 2012-04-29 ENCOUNTER — Ambulatory Visit (INDEPENDENT_AMBULATORY_CARE_PROVIDER_SITE_OTHER): Payer: Medicare Other | Admitting: Internal Medicine

## 2012-04-29 ENCOUNTER — Encounter: Payer: Self-pay | Admitting: Internal Medicine

## 2012-04-29 ENCOUNTER — Other Ambulatory Visit (INDEPENDENT_AMBULATORY_CARE_PROVIDER_SITE_OTHER): Payer: Medicare Other

## 2012-04-29 VITALS — BP 122/82 | HR 62 | Temp 97.2°F | Resp 16 | Wt 163.0 lb

## 2012-04-29 DIAGNOSIS — E538 Deficiency of other specified B group vitamins: Secondary | ICD-10-CM

## 2012-04-29 DIAGNOSIS — R05 Cough: Secondary | ICD-10-CM

## 2012-04-29 DIAGNOSIS — M79606 Pain in leg, unspecified: Secondary | ICD-10-CM

## 2012-04-29 DIAGNOSIS — M79609 Pain in unspecified limb: Secondary | ICD-10-CM

## 2012-04-29 MED ORDER — BENZONATATE 100 MG PO CAPS: 100.0000 mg | ORAL_CAPSULE | Freq: Two times a day (BID) | ORAL | Status: AC | PRN

## 2012-04-29 MED ORDER — LORATADINE 10 MG PO TABS: 10.0000 mg | ORAL_TABLET | Freq: Every day | ORAL | Status: DC

## 2012-04-29 NOTE — Patient Instructions (Addendum)
Leg pain - may be B12 deficiency or possibly nerve damage Plan - will check a B12 level.  Increase the gabapentin to three times day  Cough - lungs are clear, no signs of infection Plan  Take over the counter loratadine for allergy  Rx for tessalon perles to help with the tickle cough.

## 2012-04-29 NOTE — Progress Notes (Signed)
Subjective:    Patient ID: [name] Elliott, male    DOB: 1927-10-26, 78 y.o.   MRN: [id number]  HPI Mr . moroni Elliott today for pain in both legs - a hot feeling. It hurts all the time, not worse at night. Duration about 10 days. Has been on the tractor. He has had swelling.  He reports that he has had a cough that is worse in the AM and continues during. Scant sputum production. Has a lot of post-nasal drainage. No documented fever but he reports feeling hot. Minor SOB He has a tickle nature to the cough.   Past Medical History  Diagnosis Date  . Constipation, chronic   . Sebaceous cyst   . Other malaise and fatigue   . Pain in joint, shoulder region     Has chronic shoulder pain  . Persistent disorder of initiating or maintaining sleep   . Pain in limb   . Thrombocytopenia, unspecified   . Dementia     with periods of amnesia  . Other B-complex deficiencies   . Hypothyroidism   . Pain in limb   . Esophageal reflux   . Unspecified essential hypertension   . Arthropathy, unspecified, site unspecified   . Pure hypercholesterolemia   . Pneumonia 2011  . Orthostasis   . PVC's (premature ventricular contractions)   . S/P cardiac cath 08/08/11    mild to moderate CAD primarily in the LAD. None are obstructive and appear stable from prior cath in 2007; managed medically  . Arthritis   . CAD (coronary artery disease)     last cath in 2012. Managed medically-some blockages  . Headache   . 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 MRI Nov '11 - tendonosis and partial tear. Left shoulder surgery Jan '12 for partial shoulder replacement. [name])    Past Surgical History  Procedure Date  . Hand surgery     Right crush injury, right fifth digit contracture, limited  motion  . Lumbar spine surgery     2007, Dr Eloise Harman (Neurosurgeon)  . Knee surgery     age 72, x2  . Cataract extraction 2009  . Rotator cuff repair 8.30.2011  . US echocardiography  02-28-2010    Est EF 50-55%  . Cardiac catheterization     08/2011  . Shoulder hemi-arthroplasty   . Finger amputation     pinky finger right hand  . Hardware removal 02/05/2012    Procedure: HARDWARE REMOVAL;  Surgeon: Eulas Post, MD;  Location: [facility] OR;  Service: Orthopedics;  Laterality: Left;  . Reverse shoulder arthroplasty 02/05/2012    Procedure: REVERSE SHOULDER ARTHROPLASTY;  Surgeon: Eulas Post, MD;  Location: [facility] OR;  Service: Orthopedics;  Laterality: Left;   Family History  Problem Relation Age of Onset  . Breast cancer Mother   . Cancer Brother     throat  . Diabetes Daughter   . Colon cancer Neg Hx   . Kidney disease Father    History   Social History  . Marital Status: Widowed    Spouse Name: N/A    Number of Children: 2  . Years of Education: N/A   Occupational History  . retired    Social History Main Topics  . Smoking status: Never Smoker   . Smokeless tobacco: Current User    Types: Chew   Comment: tobacco info given 10/10/2011  . Alcohol Use: No  . Drug Use: No  . Sexually Active: Not on file  Other Topics Concern  . Not on file   Social History Narrative   2nd grade educationMarried '58, '952 dtr '59, '64, youngest daughter died septic kidneyLives alone, handles all adlsRegular Exercise -  NODaily caffine    Current Outpatient Prescriptions on File Prior to Visit  Medication Sig Dispense Refill  . aspirin 81 MG EC tablet Take 81 mg by mouth daily.        . furosemide (LASIX) 40 MG tablet Take 1 tablet (40 mg total) by mouth 2 (two) times daily.  60 tablet  11  . gabapentin (NEURONTIN) 300 MG capsule Take 1 capsule (300 mg total) by mouth 2 (two) times daily.  60 capsule  3  . isosorbide mononitrate (IMDUR) 60 MG 24 hr tablet Take 1 tablet (60 mg total) by mouth every morning.  30 tablet  11  . nitroGLYCERIN (NITROSTAT) 0.4 MG SL tablet Place 1 tablet (0.4 mg total) under the tongue every 5 (five) minutes as needed for chest pain.  25 tablet   3  . omeprazole (PRILOSEC) 20 MG capsule Take 2 capsules (40 mg total) by mouth daily.  60 capsule  6  . potassium chloride SA (K-DUR,KLOR-CON) 20 MEQ tablet Take 1 tablet (20 mEq total) by mouth 2 (two) times daily.  60 tablet  4  . promethazine (PHENERGAN) 25 MG tablet Take 1 tablet (25 mg total) by mouth every 6 (six) hours as needed for nausea.  30 tablet  0  . DISCONTD: Hyoscyamine Sulfate (HYOMAX-DT) 0.375 MG TBCR Take 1 tab bid for 5 days, then prn abd pain  30 each  1      Review of Systems System review is negative for any constitutional, cardiac, pulmonary, GI or neuro symptoms or complaints other than as described in the HPI.     Objective:   Physical Exam Filed Vitals:   04/29/12 0905  BP: 122/82  Pulse: 62  Temp: 97.2 F (36.2 C)  Resp: 16   gen'l- older white man in no actue distress       Assessment & Plan:  Leg pain - may be B12 deficiency or possibly nerve damage Plan - will check a B12 level.  Increase the gabapentin to three times day  Cough - lungs are clear, no signs of infection Plan  Take over the counter loratadine for allergy  Rx for tessalon perles to help with the tickle cough.

## 2012-04-30 ENCOUNTER — Other Ambulatory Visit: Payer: Self-pay | Admitting: Internal Medicine

## 2012-04-30 MED ORDER — CYANOCOBALAMIN 500 MCG/0.1ML NA SOLN: 500.0000 ug | NASAL | Status: DC

## 2012-05-01 ENCOUNTER — Other Ambulatory Visit: Payer: Self-pay | Admitting: *Deleted

## 2012-05-01 MED ORDER — GABAPENTIN 300 MG PO CAPS: 300.0000 mg | ORAL_CAPSULE | Freq: Three times a day (TID) | ORAL | Status: DC

## 2012-05-01 NOTE — Telephone Encounter (Signed)
Called daughter concerning labs results. Daughter states md increase gabapentin needing new rx sent to [facility]...Raechel Chute

## 2012-05-17 ENCOUNTER — Encounter: Payer: Self-pay | Admitting: Family Medicine

## 2012-05-17 ENCOUNTER — Ambulatory Visit (INDEPENDENT_AMBULATORY_CARE_PROVIDER_SITE_OTHER): Payer: Medicare Other | Admitting: Family Medicine

## 2012-05-17 VITALS — BP 108/72 | HR 73 | Temp 98.1°F | Ht 66.5 in | Wt 168.0 lb

## 2012-05-17 DIAGNOSIS — R0600 Dyspnea, unspecified: Secondary | ICD-10-CM

## 2012-05-17 DIAGNOSIS — R0609 Other forms of dyspnea: Secondary | ICD-10-CM

## 2012-05-17 DIAGNOSIS — J40 Bronchitis, not specified as acute or chronic: Secondary | ICD-10-CM

## 2012-05-17 MED ORDER — GABAPENTIN 300 MG PO CAPS: 300.0000 mg | ORAL_CAPSULE | Freq: Three times a day (TID) | ORAL | Status: DC

## 2012-05-17 MED ORDER — METHYLPREDNISOLONE ACETATE 80 MG/ML IJ SUSP
80.0000 mg | Freq: Once | INTRAMUSCULAR | Status: AC
  Administered 2012-05-17: 80 mg via INTRAMUSCULAR

## 2012-05-17 MED ORDER — DOXYCYCLINE HYCLATE 100 MG PO TABS: 100.0000 mg | ORAL_TABLET | Freq: Two times a day (BID) | ORAL | Status: AC

## 2012-05-17 NOTE — Progress Notes (Signed)
  Subjective:    Patient ID: [name] Elliott, male    DOB: 10-03-1927, 78 y.o.   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  Several day history of cough especially worse past day. He's had some wheezing off and on. No history of asthma. He had some mild nasal congestion.  No fever. No hemoptysis. No pleuritic pain. Minimal dyspnea with exertion. No exertional chest pain. He has history of CAD had some chest wall pain with coughing but states this is different than previous angina. Granddaughter recently with similar illness.  Patient requesting refill gabapentin. Recently increased to 3 times a day and prescription running out early.   Review of Systems  Constitutional: Negative for fever, chills, activity change, appetite change, fatigue and unexpected weight change.  HENT: Negative for ear pain, congestion, sore throat and trouble swallowing.   Respiratory: Positive for cough, shortness of breath and wheezing. Negative for stridor.   Cardiovascular: Negative for leg swelling.  Gastrointestinal: Negative for abdominal pain.  Musculoskeletal: Negative for arthralgias.  Skin: Negative for rash.  Neurological: Negative for syncope and headaches.  Hematological: Negative for adenopathy.       Objective:   Physical Exam  Constitutional: He appears well-developed and well-nourished.  HENT:  Right Ear: External ear normal.  Left Ear: External ear normal.  Mouth/Throat: Oropharynx is clear and moist.  Neck: Neck supple.  Cardiovascular: Normal rate and regular rhythm.   Pulmonary/Chest:       Patient has some faint expiratory wheezes diffusely. No rales. No retractions. Normal respiratory rate at rest  Musculoskeletal: He exhibits no edema.  Lymphadenopathy:    He has no cervical adenopathy.          Assessment & Plan:  Acute upper airway bronchial infection. Given age and multiple comorbidities start doxycycline 100 mg twice a day for 7 days. Depo-Medrol 80 mg  IM given. Followup promptly for any fever or worsening symptoms

## 2012-05-17 NOTE — Patient Instructions (Signed)

## 2012-05-21 ENCOUNTER — Ambulatory Visit (INDEPENDENT_AMBULATORY_CARE_PROVIDER_SITE_OTHER): Payer: Medicare Other | Admitting: Cardiovascular Disease

## 2012-05-21 ENCOUNTER — Encounter: Payer: Self-pay | Admitting: Cardiovascular Disease

## 2012-05-21 VITALS — BP 122/60 | HR 80 | Ht 66.5 in | Wt 160.8 lb

## 2012-05-21 DIAGNOSIS — I2510 Atherosclerotic heart disease of native coronary artery without angina pectoris: Secondary | ICD-10-CM

## 2012-05-21 NOTE — Progress Notes (Signed)
[name] Elliott Date of Birth  1928/01/26 [name] Elliott [address]    [address] [name], [place]  [address] [phone]  Fax  [phone]  History of Present Illness:  78 year old gentleman with a history of moderate diffuse coronary artery disease. We treated medically. The left anterior descending arteries/ 1st diagonal  stenosis has not was not suitable for PCI.  He complains of being sick in general is weak sensation. He also complains of some left arm pain.   When I saw him last month. He was having some episodes of chest pain. We were trying to decide whether or not these were due to angina. I asked him to take nitroglycerin when he had these episodes of pain. At times the nitroglycerin helps him and at other times the nitroglycerin did not do anything.  When he walks up a hill in his backyard he has significant shortness breath and this "sick feeling".   He has had a partial shoulder replacement for a shoulder fracture.  He has had chronic shoulder pain since that time.  He has chronic knee pain and is considering knee surgery. His office visit was for the purpose of preoperative evaluation.  Sept. 17, 2013- He has no cardiac complaints.  He denies any chest pain or dyspnea.  He still has some left shoulder stiffness from his surgery February 05, 2012. He has had some wheezing and was recently started on steroids and an antibiotic.  He also ws diagnosed with peripheral neurophy   He was prescribed gabapentin but his daughter did not fill the medication because it was too expensive.  She bought some over-the-counter vitamin B12 which seems to be helping a little bit.    Current Outpatient Prescriptions on File Prior to Visit  Medication Sig Dispense Refill  . aspirin 81 MG EC tablet Take 81 mg by mouth daily.        Marland Kitchen doxycycline (VIBRA-TABS) 100 MG tablet Take 1 tablet (100 mg total) by mouth 2 (two) times daily.  20 tablet  0  . furosemide (LASIX) 40 MG tablet Take 1 tablet  (40 mg total) by mouth 2 (two) times daily.  60 tablet  11  . gabapentin (NEURONTIN) 300 MG capsule Take 1 capsule (300 mg total) by mouth 3 (three) times daily.  90 capsule  5  . nitroGLYCERIN (NITROSTAT) 0.4 MG SL tablet Place 1 tablet (0.4 mg total) under the tongue every 5 (five) minutes as needed for chest pain.  25 tablet  3  . potassium chloride SA (K-DUR,KLOR-CON) 20 MEQ tablet Take 1 tablet (20 mEq total) by mouth 2 (two) times daily.  60 tablet  4  . DISCONTD: omeprazole (PRILOSEC) 20 MG capsule Take 2 capsules (40 mg total) by mouth daily.  60 capsule  6  . loratadine (CLARITIN) 10 MG tablet Take 1 tablet (10 mg total) by mouth daily.  30 tablet  11  . promethazine (PHENERGAN) 25 MG tablet Take 1 tablet (25 mg total) by mouth every 6 (six) hours as needed for nausea.  30 tablet  0  . DISCONTD: Hyoscyamine Sulfate (HYOMAX-DT) 0.375 MG TBCR Take 1 tab bid for 5 days, then prn abd pain  30 each  1  . DISCONTD: isosorbide mononitrate (IMDUR) 60 MG 24 hr tablet Take 1 tablet (60 mg total) by mouth every morning.  30 tablet  11  . DISCONTD: isosorbide mononitrate (IMDUR) 60 MG 24 hr tablet Take 60 mg by mouth daily.  Allergies  Allergen Reactions  . Crestor (Rosuvastatin Calcium)     Intolerance,  Back pain    Past Medical History  Diagnosis Date  . Constipation, chronic   . Sebaceous cyst   . Other malaise and fatigue   . Pain in joint, shoulder region     Has chronic shoulder pain  . Persistent disorder of initiating or maintaining sleep   . Pain in limb   . Thrombocytopenia, unspecified   . Dementia     with periods of amnesia  . Other B-complex deficiencies   . Hypothyroidism   . Pain in limb   . Esophageal reflux   . Unspecified essential hypertension   . Arthropathy, unspecified, site unspecified   . Pure hypercholesterolemia   . Pneumonia 2011  . Orthostasis   . PVC's (premature ventricular contractions)   . S/P cardiac cath 08/08/11    mild to moderate CAD  primarily in the LAD. None are obstructive and appear stable from prior cath in 2007; managed medically  . Arthritis   . CAD (coronary artery disease)     last cath in 2012. Managed medically-some blockages  . Headache   . 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 MRI Nov '11 - tendonosis and partial tear. Left shoulder surgery Jan '12 for partial shoulder replacement. [name] Elliott)     Past Surgical History  Procedure Date  . Hand surgery     Right crush injury, right fifth digit contracture, limited  motion  . Lumbar spine surgery     2007, Dr [name] Elliott (Neurosurgeon)  . Knee surgery     age 71, x2  . Cataract extraction 2009  . Rotator cuff repair 8.30.2011  . US echocardiography 02-28-2010    Est EF 50-55%  . Cardiac catheterization     08/2011  . Shoulder hemi-arthroplasty   . Finger amputation     pinky finger right hand  . Hardware removal 02/05/2012    Procedure: HARDWARE REMOVAL;  Surgeon: [name] Post, MD;  Location: [facility] OR;  Service: Orthopedics;  Laterality: Left;  . Reverse shoulder arthroplasty 02/05/2012    Procedure: REVERSE SHOULDER ARTHROPLASTY;  Surgeon: [name] Post, MD;  Location: [facility] OR;  Service: Orthopedics;  Laterality: Left;    History  Smoking status  . Never Smoker   Smokeless tobacco  . Current User  . Types: Chew  Comment: tobacco info given 10/10/2011    History  Alcohol Use No    Family History  Problem Relation Age of Onset  . Breast cancer Mother   . Cancer Brother     throat  . Diabetes Daughter   . Colon cancer Neg Hx   . Kidney disease Father     Reviw of Systems:  Reviewed in the HPI.  All other systems are negative.  Physical Exam: BP 122/60  Pulse 80  Ht 5' 6.5" (1.689 m)  Wt 160 lb 12.8 oz (72.938 kg)  BMI 25.56 kg/m2  SpO2 95% The patient is alert and oriented x 3.  The mood and affect are normal.   Skin: warm and dry.  Color is normal.    HEENT:   His neck is supple. His carotids are 2+.  There is no JVD  Lungs: His lungs are clear.   Heart: Regular rate, S1-S2.    Abdomen: His abdomen is soft gait is good bowel sounds. There is no hepatosplenomegaly.  Extremities:  He has no clubbing cyanosis or edema  Neuro:  His  gait is a bit unsteady. He tends to shuffle his feet. His motor and sensory are basically intact.     ECG: Normal sinus rhythm with frequent premature ventricular contractions in a bigeminal pattern. Has an incomplete right bundle branch block  Assessment / Plan:

## 2012-05-21 NOTE — Patient Instructions (Addendum)
Your physician wants you to follow-up in: 6 months  You will receive a reminder letter in the mail two months in advance. If you don't receive a letter, please call our office to schedule the follow-up appointment.   Your physician recommends that you continue on your current medications as directed. Please refer to the Current Medication list given to you today.   Your physician recommends that you return for a FASTING lipid profile: 6 months   

## 2012-05-21 NOTE — Assessment & Plan Note (Signed)
[name] Elliott seems to be doing well. His cardiac catheterization in December of 2012 revealed only mild to moderate coronary artery disease. He had a shoulder surgery without any cardiovascular complications.  We will continue with the same medications. He's not having any episodes of angina. I'll see him again in 6 months for followup visit.

## 2012-05-26 ENCOUNTER — Ambulatory Visit (INDEPENDENT_AMBULATORY_CARE_PROVIDER_SITE_OTHER): Payer: Medicare Other | Admitting: Endocrinology

## 2012-05-26 ENCOUNTER — Encounter: Payer: Self-pay | Admitting: Cardiovascular Disease

## 2012-05-26 ENCOUNTER — Encounter: Payer: Self-pay | Admitting: Endocrinology

## 2012-05-26 ENCOUNTER — Ambulatory Visit (INDEPENDENT_AMBULATORY_CARE_PROVIDER_SITE_OTHER)
Admission: RE | Admit: 2012-05-26 | Discharge: 2012-05-26 | Disposition: A | Discharge status: Home / Self Care | Payer: Medicare Other | Source: Ambulatory Visit | Attending: Endocrinology | Admitting: Endocrinology

## 2012-05-26 VITALS — BP 124/72 | HR 72 | Temp 97.5°F | Resp 14 | Ht 66.0 in | Wt 161.1 lb

## 2012-05-26 DIAGNOSIS — R05 Cough: Secondary | ICD-10-CM

## 2012-05-26 DIAGNOSIS — L600 Ingrowing nail: Secondary | ICD-10-CM | POA: Insufficient documentation

## 2012-05-26 NOTE — Progress Notes (Signed)
Subjective:    Patient ID: [name] Elliott, male    DOB: May 27, 1928, 78 y.o.   MRN: [id number]  HPI Pt was seen here 9 days ago with cough and wheezing.  He has 2 weeks of moderate prod-quality cough in the chest, and assoc pain.  He says sxs have not improved.   He also has pain at the right great toenail paronychial area.   Past Medical History  Diagnosis Date  . Constipation, chronic   . Sebaceous cyst   . Other malaise and fatigue   . Pain in joint, shoulder region     Has chronic shoulder pain  . Persistent disorder of initiating or maintaining sleep   . Pain in limb   . Thrombocytopenia, unspecified   . Dementia     with periods of amnesia  . Other B-complex deficiencies   . Hypothyroidism   . Pain in limb   . Esophageal reflux   . Unspecified essential hypertension   . Arthropathy, unspecified, site unspecified   . Pure hypercholesterolemia   . Pneumonia 2011  . Orthostasis   . PVC's (premature ventricular contractions)   . S/P cardiac cath 08/08/11    mild to moderate CAD primarily in the LAD. None are obstructive and appear stable from prior cath in 2007; managed medically  . Arthritis   . CAD (coronary artery disease)     last cath in 2012. Managed medically-some blockages  . Headache   . 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 MRI Nov '11 - tendonosis and partial tear. Left shoulder surgery Jan '12 for partial shoulder replacement. [name])     Past Surgical History  Procedure Date  . Hand surgery     Right crush injury, right fifth digit contracture, limited  motion  . Lumbar spine surgery     2007, Dr Eloise Harman (Neurosurgeon)  . Knee surgery     age 34, x2  . Cataract extraction 2009  . Rotator cuff repair 8.30.2011  . US echocardiography 02-28-2010    Est EF 50-55%  . Cardiac catheterization     08/2011  . Shoulder hemi-arthroplasty   . Finger amputation     pinky finger right hand  . Hardware removal 02/05/2012   Procedure: HARDWARE REMOVAL;  Surgeon: Eulas Post, MD;  Location: [facility] OR;  Service: Orthopedics;  Laterality: Left;  . Reverse shoulder arthroplasty 02/05/2012    Procedure: REVERSE SHOULDER ARTHROPLASTY;  Surgeon: Eulas Post, MD;  Location: [facility] OR;  Service: Orthopedics;  Laterality: Left;    History   Social History  . Marital Status: Widowed    Spouse Name: N/A    Number of Children: 2  . Years of Education: N/A   Occupational History  . retired    Social History Main Topics  . Smoking status: Former Smoker    Types: Cigars    Quit date: 09/03/1978  . Smokeless tobacco: Current User    Types: Chew   Comment: occasional cigar. tobacco info given 10/10/2011  . Alcohol Use: No  . Drug Use: No  . Sexually Active: Not on file   Other Topics Concern  . Not on file   Social History Narrative   2nd grade educationMarried '58, '952 dtr '59, '64, youngest daughter died septic kidneyLives alone, handles all adlsRegular Exercise -  NODaily caffine    Current Outpatient Prescriptions on File Prior to Visit  Medication Sig Dispense Refill  . aspirin 81 MG EC tablet Take 81 mg by  mouth daily.        . Cyanocobalamin (VITAMIN B 12 PO) Take by mouth daily.      . furosemide (LASIX) 40 MG tablet Take 1 tablet (40 mg total) by mouth 2 (two) times daily.  60 tablet  11  . gabapentin (NEURONTIN) 300 MG capsule Take 1 capsule (300 mg total) by mouth 3 (three) times daily.  90 capsule  5  . isosorbide mononitrate (IMDUR) 60 MG 24 hr tablet Take 60 mg by mouth daily.      Marland Kitchen loratadine (CLARITIN) 10 MG tablet Take 1 tablet (10 mg total) by mouth daily.  30 tablet  11  . nitroGLYCERIN (NITROSTAT) 0.4 MG SL tablet Place 1 tablet (0.4 mg total) under the tongue every 5 (five) minutes as needed for chest pain.  25 tablet  3  . omeprazole (PRILOSEC) 20 MG capsule Take 20 mg by mouth 2 (two) times daily.      . potassium chloride SA (K-DUR,KLOR-CON) 20 MEQ tablet Take 1 tablet (20 mEq total) by  mouth 2 (two) times daily.  60 tablet  4  . promethazine (PHENERGAN) 25 MG tablet Take 1 tablet (25 mg total) by mouth every 6 (six) hours as needed for nausea.  30 tablet  0  . DISCONTD: Hyoscyamine Sulfate (HYOMAX-DT) 0.375 MG TBCR Take 1 tab bid for 5 days, then prn abd pain  30 each  1    Allergies  Allergen Reactions  . Crestor (Rosuvastatin Calcium)     Intolerance,  Back pain    Family History  Problem Relation Age of Onset  . Breast cancer Mother   . Cancer Brother     throat  . Diabetes Daughter   . Colon cancer Neg Hx   . Kidney disease Father     BP 124/72  Pulse 72  Temp 97.5 F (36.4 C)  Resp 14  Ht 5\' 6"  (1.676 m)  Wt 161 lb 1.9 oz (73.084 kg)  BMI 26.01 kg/m2  SpO2 97%   Review of Systems Denies fever and sore throat    Objective:   Physical Exam VITAL SIGNS:  See vs page GENERAL: no distress LUNGS:  Clear to auscultation Right great toe; ingrown nail, but no paronychial infection  CXR: NAD     Assessment & Plan:  Acute bronchitis, persistent Ingrown toenails, new

## 2012-05-26 NOTE — Patient Instructions (Addendum)
A chest x-ray is requested for you today.  You will receive a letter with results. I hope you feel better soon.  If you don't feel better by next week, please call back.   here is a sample of "advair-100."  take 1 puff 2x a day.  rinse mouth after using. Refer to a lung specialist.  you will receive a phone call, about a day and time for an appointment Also, refer to a foot specialist.  you will receive a phone call, about a day and time for an appointment.

## 2012-05-29 ENCOUNTER — Encounter: Payer: Self-pay | Admitting: Pulmonary Disease

## 2012-05-29 ENCOUNTER — Ambulatory Visit (INDEPENDENT_AMBULATORY_CARE_PROVIDER_SITE_OTHER): Payer: Medicare Other | Admitting: Pulmonary Disease

## 2012-05-29 VITALS — BP 122/74 | HR 78 | Temp 98.3°F | Ht 63.0 in | Wt 162.4 lb

## 2012-05-29 DIAGNOSIS — M40209 Unspecified kyphosis, site unspecified: Secondary | ICD-10-CM

## 2012-05-29 DIAGNOSIS — R05 Cough: Secondary | ICD-10-CM

## 2012-05-29 DIAGNOSIS — M4000 Postural kyphosis, site unspecified: Secondary | ICD-10-CM

## 2012-05-29 DIAGNOSIS — Z23 Encounter for immunization: Secondary | ICD-10-CM

## 2012-05-29 MED ORDER — PREDNISONE 10 MG PO TABS: ORAL_TABLET | ORAL | Status: DC

## 2012-05-29 NOTE — Assessment & Plan Note (Signed)
Post bronchitic vs early ILD - note lt basal crackles on exam & bibasal infiltrates on CT abdomen 3/13 No GERD or obvious sinus drainage Flu shot Take mucinex DM twice daily Short course of prednisone - 20 mg x 1 week, 10 mg x  1 week then off Doubt bronchodilators of much benefit here If no better in 4 wks, high res CT chest

## 2012-05-29 NOTE — Assessment & Plan Note (Signed)
Moderate restriction - may benefit from rehab, if persistent dyspnea

## 2012-05-29 NOTE — Progress Notes (Signed)
  Subjective:    Patient ID: [name] Elliott, male    DOB: 08/23/1928, 78 y.o.   MRN: [id number]  HPI    Review of Systems  Constitutional: Positive for appetite change and unexpected weight change. Negative for fever.  HENT: Positive for congestion and dental problem. Negative for ear pain, sore throat, sneezing and trouble swallowing.   Respiratory: Positive for cough and shortness of breath.   Cardiovascular: Positive for leg swelling. Negative for chest pain and palpitations.  Gastrointestinal: Negative for abdominal pain.  Musculoskeletal: Negative for joint swelling.  Skin: Negative for rash.  Neurological: Positive for headaches.  Psychiatric/Behavioral: Negative for dysphoric mood. The patient is not nervous/anxious.        Objective:   Physical Exam        Assessment & Plan:

## 2012-05-29 NOTE — Addendum Note (Signed)
Addended by: Tommie Sams on: 05/29/2012 04:54 PM   Modules accepted: Orders

## 2012-05-29 NOTE — Patient Instructions (Signed)
Your cough may be related to bronchitis You may have some scarring on your lungs Flu shot Take mucinex DM twice daily Short course of prednisone - 20 mg x 1 week, 10 mg x  1 week then off

## 2012-05-29 NOTE — Progress Notes (Signed)
Subjective:    Patient ID: [name] Elliott, male    DOB: [birth year], 78 y.o.   MRN: [id number]  HPI 83/M, never smoker referred for cough and wheezing x 3 weeks - given advair - no relief This started after a URI illness with multiple family members being sick. He reports white phlegm & coughing to the point where it hurts - tessalon perles did not relieve. Daughter reports increased dyspnea over past few months, no wheeze, orthopnea or PND. He does have pedal edema, improves with lasix. Cardiac evaluation has shown mild CAD (N[place]ser), echo '11 -nml LVEF, mild diastolic dysfn, mild AS/AI CXR 9/23 Mild scarring versus atelectasis in the right lower lobe, multiple chronic vertebral fractures CT abdomen 3/13 showed Patchy opacities/dependent atelectasis at the lung bases & intrapancreatic splenule O2 satn did not drop on ambulation, stopped walking due to leg fatigue. 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  Spirometry (poor effort) - ratio of 70, moderate restriction FVC 53%, FEV1 50%   Past Medical History  Diagnosis Date  . Constipation, chronic   . Sebaceous cyst   . Other malaise and fatigue   . Pain in joint, shoulder region     Has chronic shoulder pain  . Persistent disorder of initiating or maintaining sleep   . Pain in limb   . Thrombocytopenia, unspecified   . Dementia     with periods of amnesia  . Other B-complex deficiencies   . Hypothyroidism   . Pain in limb   . Esophageal reflux   . Unspecified essential hypertension   . Arthropathy, unspecified, site unspecified   . Pure hypercholesterolemia   . Pneumonia 2011  . Orthostasis   . PVC's (premature ventricular contractions)   . S/P cardiac cath 08/08/11    mild to moderate CAD primarily in the LAD. None are obstructive and appear stable from prior cath in 2007; managed medically  . Arthritis   . CAD (coronary artery disease)     last cath in 2012. Managed medically-some blockages  .  Headache   . 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 MRI Nov '11 - tendonosis and partial tear. Left shoulder surgery Jan '12 for partial shoulder replacement. [name])    Past Surgical History  Procedure Date  . Hand surgery     Right crush injury, right fifth digit contracture, limited  motion  . Lumbar spine surgery     2007, Dr Eloise Harman (Neurosurgeon)  . Knee surgery     age 61, x2  . Cataract extraction 2009  . Rotator cuff repair 8.30.2011  . US echocardiography 02-28-2010    Est EF 50-55%  . Cardiac catheterization     08/2011  . Shoulder hemi-arthroplasty   . Finger amputation     pinky finger right hand  . Hardware removal 02/05/2012    Procedure: HARDWARE REMOVAL;  Surgeon: Eulas Post, MD;  Location: [facility] OR;  Service: Orthopedics;  Laterality: Left;  . Reverse shoulder arthroplasty 02/05/2012    Procedure: REVERSE SHOULDER ARTHROPLASTY;  Surgeon: Eulas Post, MD;  Location: [facility] OR;  Service: Orthopedics;  Laterality: Left;   History   Social History  . Marital Status: Widowed    Spouse Name: N/A    Number of Children: 2  . Years of Education: N/A   Occupational History  . retired    Social History Main Topics  . Smoking status: Former Smoker    Types:  Cigars    Quit date: 09/03/1978  . Smokeless tobacco: Current User    Types: Chew   Comment: occasional cigar. tobacco info given 10/10/2011  . Alcohol Use: No  . Drug Use: No  . Sexually Active: Not on file   Other Topics Concern  . Not on file   Social History Narrative   2nd grade educationMarried '58, '952 dtr '59, '64, youngest daughter died septic kidneyLives alone, handles all adlsRegular Exercise -  NODaily caffine     Review of Systems Constitutional: negative for anorexia, fevers and sweats  Eyes: negative for irritation, redness and visual disturbance  Ears, nose, mouth, throat, and face: negative for earaches, epistaxis, nasal congestion and sore throat    Respiratory: negative for cough, dyspnea on exertion, sputum and wheezing  Cardiovascular: negative for chest pain, dyspnea, lower extremity edema, orthopnea, palpitations and syncope  Gastrointestinal: negative for abdominal pain, constipation, diarrhea, melena, nausea and vomiting  Genitourinary:negative for dysuria, frequency and hematuria  Hematologic/lymphatic: negative for bleeding, easy bruising and lymphadenopathy  Musculoskeletal:negative for arthralgias, muscle weakness and stiff joints  Neurological: negative for coordination problems, gait problems, headaches and weakness  Endocrine: negative for diabetic symptoms including polydipsia, polyuria and weight loss     Objective:   Physical Exam  Gen. Pleasant, well-nourished, in no distress, normal affect ENT - no lesions, no post nasal drip Neck: No JVD, no thyromegaly, no carotid bruits Lungs: kyphosis ++no use of accessory muscles, no dullness to percussion, left basal rales, no rhonchi  Cardiovascular: Rhythm regular, heart sounds  normal, no murmurs or gallops, no peripheral edema Abdomen: soft and non-tender, no hepatosplenomegaly, BS normal. Musculoskeletal: No deformities, no cyanosis or clubbing Neuro:  alert, non focal       Assessment & Plan:

## 2012-06-09 ENCOUNTER — Other Ambulatory Visit: Payer: Self-pay | Admitting: *Deleted

## 2012-06-09 MED ORDER — ISOSORBIDE MONONITRATE ER 60 MG PO TB24: 60.0000 mg | ORAL_TABLET | Freq: Every day | ORAL | Status: DC

## 2012-06-10 ENCOUNTER — Other Ambulatory Visit: Payer: Self-pay | Admitting: *Deleted

## 2012-06-10 NOTE — Telephone Encounter (Signed)
Fax Received. Refill Completed. Winna Golla Chowoe (R.M.A)   

## 2012-06-10 NOTE — Telephone Encounter (Signed)
PATIENT REQUEST REFILL ON ALENDRONATE SODIUM 70MG ./ NOT ON MED LIST. IS THIS OK.

## 2012-06-11 MED ORDER — ALENDRONATE SODIUM 70 MG PO TABS: 70.0000 mg | ORAL_TABLET | ORAL | Status: DC

## 2012-06-11 NOTE — Telephone Encounter (Signed)
k

## 2012-06-12 ENCOUNTER — Other Ambulatory Visit: Payer: Self-pay | Admitting: *Deleted

## 2012-06-12 MED ORDER — ALENDRONATE SODIUM 70 MG PO TABS: 70.0000 mg | ORAL_TABLET | ORAL | Status: DC

## 2012-06-26 ENCOUNTER — Ambulatory Visit: Payer: Medicare Other | Admitting: Adult Health

## 2012-09-01 ENCOUNTER — Other Ambulatory Visit: Payer: Self-pay | Admitting: *Deleted

## 2012-09-01 MED ORDER — FUROSEMIDE 40 MG PO TABS: 40.0000 mg | ORAL_TABLET | Freq: Two times a day (BID) | ORAL | Status: DC

## 2012-09-22 ENCOUNTER — Other Ambulatory Visit: Payer: Self-pay | Admitting: *Deleted

## 2012-09-22 MED ORDER — POTASSIUM CHLORIDE CRYS ER 20 MEQ PO TBCR: 20.0000 meq | EXTENDED_RELEASE_TABLET | Freq: Two times a day (BID) | ORAL | Status: DC

## 2012-09-22 MED ORDER — OMEPRAZOLE 20 MG PO CPDR: 20.0000 mg | DELAYED_RELEASE_CAPSULE | Freq: Two times a day (BID) | ORAL | Status: DC

## 2012-09-22 MED ORDER — ISOSORBIDE MONONITRATE ER 60 MG PO TB24: 60.0000 mg | ORAL_TABLET | Freq: Every day | ORAL | Status: DC

## 2012-09-22 MED ORDER — GABAPENTIN 300 MG PO CAPS: 300.0000 mg | ORAL_CAPSULE | Freq: Three times a day (TID) | ORAL | Status: DC

## 2012-09-22 MED ORDER — FUROSEMIDE 40 MG PO TABS: 40.0000 mg | ORAL_TABLET | Freq: Two times a day (BID) | ORAL | Status: DC

## 2012-09-29 ENCOUNTER — Other Ambulatory Visit: Payer: Self-pay | Admitting: *Deleted

## 2012-09-29 MED ORDER — FUROSEMIDE 40 MG PO TABS: 40.0000 mg | ORAL_TABLET | Freq: Two times a day (BID) | ORAL | Status: DC

## 2012-09-29 MED ORDER — GABAPENTIN 300 MG PO CAPS: 300.0000 mg | ORAL_CAPSULE | Freq: Three times a day (TID) | ORAL | Status: DC

## 2012-09-29 MED ORDER — POTASSIUM CHLORIDE CRYS ER 20 MEQ PO TBCR: 20.0000 meq | EXTENDED_RELEASE_TABLET | Freq: Two times a day (BID) | ORAL | Status: DC

## 2012-09-29 MED ORDER — OMEPRAZOLE 20 MG PO CPDR: 20.0000 mg | DELAYED_RELEASE_CAPSULE | Freq: Two times a day (BID) | ORAL | Status: DC

## 2012-09-29 MED ORDER — ALENDRONATE SODIUM 70 MG PO TABS: 70.0000 mg | ORAL_TABLET | ORAL | Status: DC

## 2012-09-29 MED ORDER — ISOSORBIDE MONONITRATE ER 60 MG PO TB24: 60.0000 mg | ORAL_TABLET | Freq: Every day | ORAL | Status: DC

## 2012-09-29 NOTE — Telephone Encounter (Signed)
DAUGHTER OF PATIENT CALLED CONCERN OF MEDICATIONS . STATES ALL OF HIS MEDS SHOULD BY FROM [facility] ONLY SO HE CAN RECEIVE THEM FREE.. ALSO DAUGHTER IS CONCERNED OF A NIECE THAT IS LIVING WITH HER DAD AT THIS TIME IS TRYING TO GET IN ON ALL OF HIS CARE AND MONEY AND MEDICATIONS. DAUGHTER , [name] Elliott , STATES  SHE HAS POWER OF ATTORNEY OF HIS THINGS, PLEASE ADVISE ON PATIENT STATUS OF MEDICATIONS. CB#[phone]

## 2012-09-29 NOTE — Telephone Encounter (Signed)
Pt's rx's never reached [facility] via escript, on 09/22/12. Re-ordered refills of rx's; omeprazole, potassium chloride, and isosorbide.

## 2012-09-29 NOTE — Telephone Encounter (Signed)
Sent pt's rx via escript on 09/22/12. Did not go through. Re-ordering rx's for gabapentin, alendronate, and furosemide.

## 2012-09-30 ENCOUNTER — Telehealth: Payer: Self-pay | Admitting: *Deleted

## 2012-09-30 ENCOUNTER — Other Ambulatory Visit: Payer: Self-pay | Admitting: *Deleted

## 2012-09-30 MED ORDER — GABAPENTIN 300 MG PO CAPS: 300.0000 mg | ORAL_CAPSULE | Freq: Three times a day (TID) | ORAL | Status: DC

## 2012-09-30 MED ORDER — ISOSORBIDE MONONITRATE ER 60 MG PO TB24: 60.0000 mg | ORAL_TABLET | Freq: Every day | ORAL | Status: DC

## 2012-09-30 MED ORDER — POTASSIUM CHLORIDE CRYS ER 20 MEQ PO TBCR: 20.0000 meq | EXTENDED_RELEASE_TABLET | Freq: Two times a day (BID) | ORAL | Status: DC

## 2012-09-30 MED ORDER — OMEPRAZOLE 20 MG PO CPDR: 20.0000 mg | DELAYED_RELEASE_CAPSULE | Freq: Two times a day (BID) | ORAL | Status: DC

## 2012-09-30 MED ORDER — ALENDRONATE SODIUM 70 MG PO TABS: 70.0000 mg | ORAL_TABLET | ORAL | Status: DC

## 2012-09-30 MED ORDER — FUROSEMIDE 40 MG PO TABS: 40.0000 mg | ORAL_TABLET | Freq: Two times a day (BID) | ORAL | Status: DC

## 2012-09-30 NOTE — Telephone Encounter (Signed)
Pt needs refills of medications sent to [facility] for father. They were sent to local pharmacy.  Fosamax rx printed out, awaiting MD's signature.

## 2012-09-30 NOTE — Telephone Encounter (Signed)
Rx entry error... Wrong pharmacy. Sent to [facility] today.

## 2012-10-07 ENCOUNTER — Telehealth: Payer: Self-pay | Admitting: Internal Medicine

## 2012-10-07 ENCOUNTER — Other Ambulatory Visit: Payer: Self-pay | Admitting: *Deleted

## 2012-10-07 MED ORDER — POTASSIUM CHLORIDE CRYS ER 20 MEQ PO TBCR: 20.0000 meq | EXTENDED_RELEASE_TABLET | Freq: Two times a day (BID) | ORAL | Status: DC

## 2012-10-07 NOTE — Telephone Encounter (Signed)
Returned [name]'s call, pt's daughter, and lvm asking her to call me back with which rx's still had not been sent. Apologized and told her that there was a problem with another pt's refills to [facility] on the same day.

## 2012-10-07 NOTE — Telephone Encounter (Signed)
Patients daughter is requesting a call back because [facility] has not received all her fathers medications and she needs to speak with someone about which ones still need to be sent

## 2012-11-01 DIAGNOSIS — Z8711 Personal history of peptic ulcer disease: Secondary | ICD-10-CM

## 2012-11-01 HISTORY — DX: Personal history of peptic ulcer disease: Z87.11

## 2012-11-10 ENCOUNTER — Ambulatory Visit: Payer: Medicare Other | Admitting: Internal Medicine

## 2012-11-17 ENCOUNTER — Encounter: Payer: Self-pay | Admitting: Internal Medicine

## 2012-11-17 ENCOUNTER — Ambulatory Visit (INDEPENDENT_AMBULATORY_CARE_PROVIDER_SITE_OTHER): Payer: Medicare Other | Admitting: Internal Medicine

## 2012-11-17 VITALS — BP 116/76 | HR 86 | Temp 97.5°F | Resp 16 | Wt 166.0 lb

## 2012-11-17 DIAGNOSIS — S91309A Unspecified open wound, unspecified foot, initial encounter: Secondary | ICD-10-CM

## 2012-11-17 DIAGNOSIS — S91302A Unspecified open wound, left foot, initial encounter: Secondary | ICD-10-CM

## 2012-11-17 DIAGNOSIS — I739 Peripheral vascular disease, unspecified: Secondary | ICD-10-CM

## 2012-11-17 NOTE — Patient Instructions (Addendum)
1. Small lesion on the side of your foot - this is a minor injury. There is no ulcer at this location and it looks healed.  2. Circulation - there is a very good pulse on the top of your foot which make significant Peripheral Arterial Disease less likely. You had a test November 30th 2012 and I will get that test pulled.  Plan Knee high men's support hose -put them on in the AM, take off in PM  Elevate your legs during the day.   You otherwise seem to be doing well.

## 2012-11-18 NOTE — Progress Notes (Signed)
Subjective:    Patient ID: [name] Elliott, male    DOB: [birth year], 79 y.o.   MRN: [id number]  HPI Mr. Menter presents for evaluation of a small ulcer at the later aspect of the left foot. He is also concerned about his circulation. He does not recall any injury that may have cause the foot lesion. There has been no pain, no drainage and he admits that today it looks better.  Past Medical History  Diagnosis Date  . Constipation, chronic   . Sebaceous cyst   . Other malaise and fatigue   . Pain in joint, shoulder region     Has chronic shoulder pain  . Persistent disorder of initiating or maintaining sleep   . Pain in limb   . Thrombocytopenia, unspecified   . Dementia     with periods of amnesia  . Other B-complex deficiencies   . Hypothyroidism   . Pain in limb   . Esophageal reflux   . Unspecified essential hypertension   . Arthropathy, unspecified, site unspecified   . Pure hypercholesterolemia   . Pneumonia 2011  . Orthostasis   . PVC's (premature ventricular contractions)   . S/P cardiac cath 08/08/11    mild to moderate CAD primarily in the LAD. None are obstructive and appear stable from prior cath in 2007; managed medically  . Arthritis   . CAD (coronary artery disease)     last cath in 2012. Managed medically-some blockages  . Headache   . 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 MRI Nov '11 - tendonosis and partial tear. Left shoulder surgery Jan '12 for partial shoulder replacement. [name])    Past Surgical History  Procedure Laterality Date  . Hand surgery      Right crush injury, right fifth digit contracture, limited  motion  . Lumbar spine surgery      2007, Dr Eloise Harman (Neurosurgeon)  . Knee surgery      age 66, x2  . Cataract extraction  2009  . Rotator cuff repair  8.30.2011  . US echocardiography  02-28-2010    Est EF 50-55%  . Cardiac catheterization      08/2011  . Shoulder hemi-arthroplasty    . Finger amputation       pinky finger right hand  . Hardware removal  02/05/2012    Procedure: HARDWARE REMOVAL;  Surgeon: Eulas Post, MD;  Location: [facility] OR;  Service: Orthopedics;  Laterality: Left;  . Reverse shoulder arthroplasty  02/05/2012    Procedure: REVERSE SHOULDER ARTHROPLASTY;  Surgeon: Eulas Post, MD;  Location: [facility] OR;  Service: Orthopedics;  Laterality: Left;   Family History  Problem Relation Age of Onset  . Breast cancer Mother   . Cancer Brother     throat  . Diabetes Daughter   . Colon cancer Neg Hx   . Kidney disease Father    History   Social History  . Marital Status: Widowed    Spouse Name: N/A    Number of Children: 2  . Years of Education: N/A   Occupational History  . retired    Social History Main Topics  . Smoking status: Former Smoker    Types: Cigars    Quit date: 09/03/1978  . Smokeless tobacco: Current User    Types: Chew     Comment: occasional cigar. tobacco info given 10/10/2011  . Alcohol Use: No  . Drug Use: No  . Sexually Active: Not on file   Other Topics  Concern  . Not on file   Social History Narrative   2nd grade education   Married '58, '95   2 dtr '59, '64, youngest daughter died septic kidney   Lives alone, handles all adls      Regular Exercise -  NO      Daily caffine    Current Outpatient Prescriptions on File Prior to Visit  Medication Sig Dispense Refill  . alendronate (FOSAMAX) 70 MG tablet Take 1 tablet (70 mg total) by mouth every 7 (seven) days. Take with a full glass of water on an empty stomach. Remain upright for an hour after taking.  12 tablet  3  . aspirin 81 MG EC tablet Take 81 mg by mouth daily.        . Cyanocobalamin (VITAMIN B 12 PO) Take by mouth daily.      . furosemide (LASIX) 40 MG tablet Take 1 tablet (40 mg total) by mouth 2 (two) times daily.  180 tablet  3  . gabapentin (NEURONTIN) 300 MG capsule Take 1 capsule (300 mg total) by mouth 3 (three) times daily.  270 capsule  3  . isosorbide mononitrate  (IMDUR) 60 MG 24 hr tablet Take 1 tablet (60 mg total) by mouth daily.  90 tablet  3  . loratadine (CLARITIN) 10 MG tablet Take 1 tablet (10 mg total) by mouth daily.  30 tablet  11  . omeprazole (PRILOSEC) 20 MG capsule Take 1 capsule (20 mg total) by mouth 2 (two) times daily.  180 capsule  3  . potassium chloride SA (K-DUR,KLOR-CON) 20 MEQ tablet Take 1 tablet (20 mEq total) by mouth 2 (two) times daily.  180 tablet  3  . predniSONE (DELTASONE) 10 MG tablet Take 20 mg daily x 1 week, then 10 mg a day x 1 week then stop  21 tablet  0  . nitroGLYCERIN (NITROSTAT) 0.4 MG SL tablet Place 1 tablet (0.4 mg total) under the tongue every 5 (five) minutes as needed for chest pain.  25 tablet  3  . promethazine (PHENERGAN) 25 MG tablet Take 1 tablet (25 mg total) by mouth every 6 (six) hours as needed for nausea.  30 tablet  0  . [DISCONTINUED] Hyoscyamine Sulfate (HYOMAX-DT) 0.375 MG TBCR Take 1 tab bid for 5 days, then prn abd pain  30 each  1   No current facility-administered medications on file prior to visit.      Review of Systems System review is negative for any constitutional, cardiac, pulmonary, GI or neuro symptoms or complaints other than as described in the HPI.     Objective:   Physical Exam Filed Vitals:   11/17/12 1530  BP: 116/76  Pulse: 86  Temp: 97.5 F (36.4 C)  Resp: 16   Gen'l- WNWD white man in no distress. Has been keeping a wood fire by the smell of things HEENT- C&S clear Cor - RRR, 2+ dorsalis pedis pulse left foot, cool to the touch, toes are cyanotic when his legs are down but normalize with elevation. He has slow capillary refill at the toes. Pulm - normal respirations Derm - he has a small, 2 mm, erythematous lesion left scapula that is not tender or fluctuant. There is a 2 mm lesion lateral left foot that is superficial, w/o drainage.       Assessment & Plan:  1. Foot wound - a trivial wound. No particular care is needed.  2. Circulation - PAD is  unlikely with such  a good pulse. He has had ABI done in Nov 2012 but report is not retrievable in EPIC  Plan Elevate legs and wear knee high supportive men's hosiery

## 2012-11-19 ENCOUNTER — Encounter (HOSPITAL_COMMUNITY)
Admission: EM | Disposition: A | Discharge status: Home / Self Care | Payer: Self-pay | Source: Home / Self Care | Attending: Internal Medicine

## 2012-11-19 ENCOUNTER — Emergency Department (HOSPITAL_COMMUNITY): Payer: Medicare Other

## 2012-11-19 ENCOUNTER — Telehealth: Payer: Self-pay | Admitting: Internal Medicine

## 2012-11-19 ENCOUNTER — Encounter (HOSPITAL_COMMUNITY): Payer: Self-pay

## 2012-11-19 ENCOUNTER — Inpatient Hospital Stay (HOSPITAL_COMMUNITY)
Admission: EM | Admit: 2012-11-19 | Discharge: 2012-11-21 | DRG: 384 | Disposition: A | Discharge status: Home / Self Care | Payer: Medicare Other | Attending: Internal Medicine | Admitting: Internal Medicine

## 2012-11-19 DIAGNOSIS — Z888 Allergy status to other drugs, medicaments and biological substances status: Secondary | ICD-10-CM

## 2012-11-19 DIAGNOSIS — D696 Thrombocytopenia, unspecified: Secondary | ICD-10-CM | POA: Diagnosis present

## 2012-11-19 DIAGNOSIS — K449 Diaphragmatic hernia without obstruction or gangrene: Secondary | ICD-10-CM | POA: Diagnosis present

## 2012-11-19 DIAGNOSIS — F068 Other specified mental disorders due to known physiological condition: Secondary | ICD-10-CM

## 2012-11-19 DIAGNOSIS — K922 Gastrointestinal hemorrhage, unspecified: Secondary | ICD-10-CM

## 2012-11-19 DIAGNOSIS — K259 Gastric ulcer, unspecified as acute or chronic, without hemorrhage or perforation: Principal | ICD-10-CM | POA: Diagnosis present

## 2012-11-19 DIAGNOSIS — G8929 Other chronic pain: Secondary | ICD-10-CM | POA: Diagnosis present

## 2012-11-19 DIAGNOSIS — M25519 Pain in unspecified shoulder: Secondary | ICD-10-CM | POA: Diagnosis present

## 2012-11-19 DIAGNOSIS — Z791 Long term (current) use of non-steroidal anti-inflammatories (NSAID): Secondary | ICD-10-CM

## 2012-11-19 DIAGNOSIS — Z7982 Long term (current) use of aspirin: Secondary | ICD-10-CM

## 2012-11-19 DIAGNOSIS — Q409 Congenital malformation of upper alimentary tract, unspecified: Secondary | ICD-10-CM

## 2012-11-19 DIAGNOSIS — Z860100 Personal history of colon polyps, unspecified: Secondary | ICD-10-CM

## 2012-11-19 DIAGNOSIS — E039 Hypothyroidism, unspecified: Secondary | ICD-10-CM | POA: Diagnosis present

## 2012-11-19 DIAGNOSIS — Z87891 Personal history of nicotine dependence: Secondary | ICD-10-CM

## 2012-11-19 DIAGNOSIS — R5383 Other fatigue: Secondary | ICD-10-CM

## 2012-11-19 DIAGNOSIS — Z79899 Other long term (current) drug therapy: Secondary | ICD-10-CM

## 2012-11-19 DIAGNOSIS — G478 Other sleep disorders: Secondary | ICD-10-CM | POA: Diagnosis present

## 2012-11-19 DIAGNOSIS — K5909 Other constipation: Secondary | ICD-10-CM | POA: Diagnosis present

## 2012-11-19 DIAGNOSIS — E7800 Pure hypercholesterolemia, unspecified: Secondary | ICD-10-CM | POA: Diagnosis present

## 2012-11-19 DIAGNOSIS — K219 Gastro-esophageal reflux disease without esophagitis: Secondary | ICD-10-CM | POA: Diagnosis present

## 2012-11-19 DIAGNOSIS — Z96619 Presence of unspecified artificial shoulder joint: Secondary | ICD-10-CM

## 2012-11-19 DIAGNOSIS — F0390 Unspecified dementia without behavioral disturbance: Secondary | ICD-10-CM | POA: Diagnosis present

## 2012-11-19 DIAGNOSIS — I2510 Atherosclerotic heart disease of native coronary artery without angina pectoris: Secondary | ICD-10-CM | POA: Diagnosis present

## 2012-11-19 DIAGNOSIS — I499 Cardiac arrhythmia, unspecified: Secondary | ICD-10-CM

## 2012-11-19 DIAGNOSIS — D649 Anemia, unspecified: Secondary | ICD-10-CM | POA: Diagnosis not present

## 2012-11-19 DIAGNOSIS — I4949 Other premature depolarization: Secondary | ICD-10-CM | POA: Diagnosis present

## 2012-11-19 DIAGNOSIS — M129 Arthropathy, unspecified: Secondary | ICD-10-CM | POA: Diagnosis present

## 2012-11-19 DIAGNOSIS — Z981 Arthrodesis status: Secondary | ICD-10-CM

## 2012-11-19 DIAGNOSIS — I10 Essential (primary) hypertension: Secondary | ICD-10-CM | POA: Diagnosis present

## 2012-11-19 DIAGNOSIS — K2210 Ulcer of esophagus without bleeding: Secondary | ICD-10-CM | POA: Diagnosis present

## 2012-11-19 HISTORY — PX: ESOPHAGOGASTRODUODENOSCOPY: SHX5428

## 2012-11-19 HISTORY — DX: Disease of blood and blood-forming organs, unspecified: D75.9

## 2012-11-19 LAB — CBC WITH DIFFERENTIAL/PLATELET
Basophils Relative: 0 % (ref 0–1)
Eosinophils Absolute: 0 10*3/uL (ref 0.0–0.7)
Hemoglobin: 14.7 g/dL (ref 13.0–17.0)
MCH: 30.1 pg (ref 26.0–34.0)
MCHC: 34 g/dL (ref 30.0–36.0)
Monocytes Relative: 12 % (ref 3–12)
Neutro Abs: 5.4 10*3/uL (ref 1.7–7.7)
Neutrophils Relative %: 74 % (ref 43–77)
Platelets: 234 10*3/uL (ref 150–400)
RBC: 4.89 MIL/uL (ref 4.22–5.81)

## 2012-11-19 LAB — APTT: aPTT: 32 seconds (ref 24–37)

## 2012-11-19 LAB — TYPE AND SCREEN
ABO/RH(D): O POS
Antibody Screen: NEGATIVE

## 2012-11-19 LAB — POCT I-STAT, CHEM 8
Calcium, Ion: 1.08 mmol/L — ABNORMAL LOW (ref 1.13–1.30)
Chloride: 106 mEq/L (ref 96–112)
Glucose, Bld: 120 mg/dL — ABNORMAL HIGH (ref 70–99)
HCT: 45 % (ref 39.0–52.0)
Hemoglobin: 15.3 g/dL (ref 13.0–17.0)
TCO2: 25 mmol/L (ref 0–100)

## 2012-11-19 LAB — OCCULT BLOOD, POC DEVICE: Fecal Occult Bld: POSITIVE — AB

## 2012-11-19 LAB — COMPREHENSIVE METABOLIC PANEL
ALT: 10 U/L (ref 0–53)
AST: 16 U/L (ref 0–37)
Albumin: 3.6 g/dL (ref 3.5–5.2)
Alkaline Phosphatase: 51 U/L (ref 39–117)
Chloride: 101 mEq/L (ref 96–112)
Potassium: 3.6 mEq/L (ref 3.5–5.1)
Sodium: 136 mEq/L (ref 135–145)
Total Bilirubin: 0.6 mg/dL (ref 0.3–1.2)
Total Protein: 7.2 g/dL (ref 6.0–8.3)

## 2012-11-19 LAB — LACTIC ACID, PLASMA: Lactic Acid, Venous: 1.3 mmol/L (ref 0.5–2.2)

## 2012-11-19 LAB — LIPASE, BLOOD: Lipase: 18 U/L (ref 11–59)

## 2012-11-19 SURGERY — EGD (ESOPHAGOGASTRODUODENOSCOPY)
Anesthesia: Moderate Sedation

## 2012-11-19 MED ORDER — ONDANSETRON HCL 4 MG/2ML IJ SOLN: 4.0000 mg | Freq: Three times a day (TID) | INTRAMUSCULAR | Status: AC | PRN

## 2012-11-19 MED ORDER — ONDANSETRON HCL 4 MG/2ML IJ SOLN
4.0000 mg | Freq: Once | INTRAMUSCULAR | Status: AC
  Administered 2012-11-19: 4 mg via INTRAVENOUS
  Filled 2012-11-19: qty 2

## 2012-11-19 MED ORDER — SODIUM CHLORIDE 0.9 % IV SOLN
INTRAVENOUS | Status: DC
  Administered 2012-11-19 – 2012-11-20 (×2): via INTRAVENOUS
  Administered 2012-11-20: 1000 mL via INTRAVENOUS
  Administered 2012-11-21: 04:00:00 via INTRAVENOUS

## 2012-11-19 MED ORDER — SODIUM CHLORIDE 0.9 % IV SOLN
1000.0000 mL | Freq: Once | INTRAVENOUS | Status: AC
  Administered 2012-11-19: 1000 mL via INTRAVENOUS

## 2012-11-19 MED ORDER — PROMETHAZINE HCL 25 MG PO TABS: 12.5000 mg | ORAL_TABLET | Freq: Four times a day (QID) | ORAL | Status: DC | PRN

## 2012-11-19 MED ORDER — MIDAZOLAM HCL 10 MG/2ML IJ SOLN
INTRAMUSCULAR | Status: DC | PRN
  Administered 2012-11-19 (×2): 2 mg via INTRAVENOUS

## 2012-11-19 MED ORDER — SODIUM CHLORIDE 0.9 % IV SOLN: INTRAVENOUS | Status: AC

## 2012-11-19 MED ORDER — SODIUM CHLORIDE 0.9 % IJ SOLN
3.0000 mL | Freq: Two times a day (BID) | INTRAMUSCULAR | Status: DC
  Administered 2012-11-19 – 2012-11-20 (×3): 3 mL via INTRAVENOUS

## 2012-11-19 MED ORDER — MORPHINE SULFATE 2 MG/ML IJ SOLN: 1.0000 mg | INTRAMUSCULAR | Status: DC | PRN

## 2012-11-19 MED ORDER — SODIUM CHLORIDE 0.9 % IV SOLN: 1000.0000 mL | INTRAVENOUS | Status: DC

## 2012-11-19 MED ORDER — FENTANYL CITRATE 0.05 MG/ML IJ SOLN
INTRAMUSCULAR | Status: DC | PRN
  Administered 2012-11-19 (×2): 25 ug via INTRAVENOUS

## 2012-11-19 MED ORDER — BUTAMBEN-TETRACAINE-BENZOCAINE 2-2-14 % EX AERO
INHALATION_SPRAY | CUTANEOUS | Status: DC | PRN
  Administered 2012-11-19: 2 via TOPICAL

## 2012-11-19 MED ORDER — POTASSIUM CHLORIDE CRYS ER 20 MEQ PO TBCR
20.0000 meq | EXTENDED_RELEASE_TABLET | Freq: Two times a day (BID) | ORAL | Status: DC
  Administered 2012-11-19 – 2012-11-21 (×4): 20 meq via ORAL
  Filled 2012-11-19 (×5): qty 1

## 2012-11-19 MED ORDER — PANTOPRAZOLE SODIUM 40 MG IV SOLR
40.0000 mg | Freq: Two times a day (BID) | INTRAVENOUS | Status: DC
  Administered 2012-11-19 (×2): 40 mg via INTRAVENOUS
  Filled 2012-11-19 (×5): qty 40

## 2012-11-19 MED ORDER — PANTOPRAZOLE SODIUM 40 MG IV SOLR
40.0000 mg | Freq: Once | INTRAVENOUS | Status: AC
  Administered 2012-11-19: 40 mg via INTRAVENOUS
  Filled 2012-11-19: qty 40

## 2012-11-19 NOTE — Telephone Encounter (Signed)
Patient Information:  Caller Name: Agustin Cree  Phone: [phone]  Patient: [name] Elliott, [name] Elliott  Gender: Male  DOB: 07-May-1928  Age: 79 Years  PCP: Illene Regulus (Adults only)  Office Follow Up:  Does the office need to follow up with this patient?: No  Instructions For The Office: N/A  RN Note:  [name] called about black emesis and black tarry diarrhea stools.  Voided small amount this morning. Unable to quantify emesis or stools. Last emesis this morning; epidoses of stool incontenence. Right upper chest pain present since 11/18/12. History CAD. Instructed to call 911 now; even if father is reluctant, the medics should be called immediately and then medics can decide to transport or safe to drive to ED.  Agreed she would call 911 now.  Symptoms  Reason For Call & Symptoms: Emergent call: Black vomit and black diarrhea stools  Reviewed Health History In EMR: Yes  Reviewed Medications In EMR: Yes  Reviewed Allergies In EMR: Yes  Reviewed Surgeries / Procedures: Yes  Date of Onset of Symptoms: 11/18/2012  Treatments Tried: drinking water  Treatments Tried Worked: No  Guideline(s) Used:  Vomiting  Chest Pain  Disposition Per Guideline:   Call EMS 911 Now  Reason For Disposition Reached:   Chest pain lasting longer than 5 minutes and ANY of the following:  Over 35 years old Over 81 years old and at least one cardiac risk factor (i.e., high blood pressure, diabetes, high cholesterol, obesity, smoker or strong family history of heart disease) Pain is crushing, pressure-like, or heavy  Took nitroglycerin and chest pain was not relieved History of heart disease (i.e., angina, heart attack, bypass surgery, angioplasty, CHF)  Advice Given:  N/A  Patient Will Follow Care Advice:  YES

## 2012-11-19 NOTE — Progress Notes (Signed)
Pt arrived via bed to 5505.  Alert to self and place, disoriented to situation.  Reoriented pt to surroundings.  Oriented pt to room and instructed to use call bell for assistance.  Bed alarm activated.

## 2012-11-19 NOTE — H&P (Signed)
[facility] History and Physical  [name] Elliott ZOX:[id number] DOB: March 02, 1928 DOA: 11/19/2012  Referring physician: Manus Gunning PCP: Illene Regulus, MD  Specialists: GI-consulted  Chief Complaint: Dark stool, Nausea and vomiting  HPI: [name] Elliott is a 79 y.o. male who presented to [facility] ed on 3.19.14with some dark stool and vomiting.  He called his PCP Dr. Debby Bud and an EMT was sent over to his house.  Patient states that on 3.17.14 he started to have symptoms of diarrhea that night.  THe stool seemed to be black in colour.  It was liquid like in nature. It did not appear to look like coffee grounds.  He reports having multiple small stols and had acidents with this and messed up his shorts . He then reports some vomiting 3.18 pm   He thinks he had vomiting about 5-6 time and clour of vomit was black in nature  He reports the pain in the RUQ qhihc is chronic-see chart review No outsdie food or eating anyplace out of the ordinary, but thinks he might have bought some potted meat that was bad, but daughter rports the dates on the meat were okay. Patient admits to taking Goody powders 2-3 times a week last episode of taking this was on 11/16/12, he also does take his alendronate on Tuesdays.    Emergency room workup revealed a relatively normal basic metabolic panel with a slightly low calcium of 1.08, lactic acid 1.3, lipase of 18, CBC showed he will 14.7, and a repeat hemoglobin is actually 15.3 Patient is Hemoccult positive Acute abdominal series showed nonobstructive bowel gas pattern, no pneumoperitoneum, no evidence of acute cardiopulmonary disease.  Review of Systems:   Past Medical History  Diagnosis Date  . Constipation, chronic   . Sebaceous cyst   . Other malaise and fatigue   . Pain in joint, shoulder region     Has chronic shoulder pain  . Persistent disorder of initiating or maintaining sleep   . Pain in limb   . Thrombocytopenia, unspecified   . Dementia     with  periods of amnesia  . Other B-complex deficiencies   . Hypothyroidism   . Pain in limb   . Esophageal reflux   . Unspecified essential hypertension   . Arthropathy, unspecified, site unspecified   . Pure hypercholesterolemia   . Pneumonia 2011  . Orthostasis   . PVC's (premature ventricular contractions)   . S/P cardiac cath 08/08/11    mild to moderate CAD primarily in the LAD. None are obstructive and appear stable from prior cath in 2007; managed medically  . Arthritis   . CAD (coronary artery disease)     last cath in 2012. Managed medically-some blockages  . Headache   . 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 MRI Nov '11 - tendonosis and partial tear. Left shoulder surgery Jan '12 for partial shoulder replacement. [name])    Chart review Admission 02/05/12 for failed arthroplasty for reverse shoulder arthroplasty Colonoscopy 3.22.13=4 mm sessile polpy-diverticulosis Seen by Dr. Arlyce Dice as an out-patient fro chronic UQ pain Endocsopy 2.111.3=mod gastrtitis Cardiac cath 12.5.12-mild to mod CAD c irregularities in LAD Admission 3.21.12 c Altered mental status and dementia Admission c chronic post dislocation of L arm Carpal tunnel surgery 6.24.09   Past Surgical History  Procedure Laterality Date  . Hand surgery      Right crush injury, right fifth digit contracture, limited  motion  . Lumbar spine surgery      2007, Dr  Paterson Psychologist, educational)  . Knee surgery      age 75, x2  . Cataract extraction  2009  . Rotator cuff repair  8.30.2011  . US echocardiography  02-28-2010    Est EF 50-55%  . Cardiac catheterization      08/2011  . Shoulder hemi-arthroplasty    . Finger amputation      pinky finger right hand  . Hardware removal  02/05/2012    Procedure: HARDWARE REMOVAL;  Surgeon: Eulas Post, MD;  Location: [facility] OR;  Service: Orthopedics;  Laterality: Left;  . Reverse shoulder arthroplasty  02/05/2012    Procedure: REVERSE SHOULDER  ARTHROPLASTY;  Surgeon: Eulas Post, MD;  Location: [facility] OR;  Service: Orthopedics;  Laterality: Left;   Social History:  reports that he quit smoking about 34 years ago. His smoking use included Cigars. His smokeless tobacco use includes Chew. He reports that he does not drink alcohol or use illicit drugs.  Allergies  Allergen Reactions  . Crestor (Rosuvastatin Calcium)     Intolerance,  Back pain    Family History  Problem Relation Age of Onset  . Breast cancer Mother   . Cancer Brother     throat  . Diabetes Daughter   . Colon cancer Neg Hx   . Kidney disease Father    Prior to Admission medications   Medication Sig Start Date End Date Taking? Authorizing Provider  acetaminophen (TYLENOL) 500 MG tablet Take 1,000 mg by mouth every 6 (six) hours as needed for pain.   Yes Historical Provider, MD  alendronate (FOSAMAX) 70 MG tablet Take 70 mg by mouth every 7 (seven) days. Takes on Tuesdays. Take with a full glass of water on an empty stomach.   Yes Historical Provider, MD  aspirin EC 81 MG tablet Take 81 mg by mouth daily.   Yes Historical Provider, MD  Aspirin-Acetaminophen-Caffeine (GOODY HEADACHE PO) Take 1 packet by mouth daily as needed (for headache).   Yes Historical Provider, MD  furosemide (LASIX) 40 MG tablet Take 40 mg by mouth 2 (two) times daily.   Yes Historical Provider, MD  gabapentin (NEURONTIN) 300 MG capsule Take 300 mg by mouth 3 (three) times daily.   Yes Historical Provider, MD  GLUCOSAMINE PO Take 1 tablet by mouth daily.   Yes Historical Provider, MD  isosorbide mononitrate (IMDUR) 60 MG 24 hr tablet Take 60 mg by mouth daily.   Yes Historical Provider, MD  omeprazole (PRILOSEC) 20 MG capsule Take 40 mg by mouth daily.   Yes Historical Provider, MD  potassium chloride SA (K-DUR,KLOR-CON) 20 MEQ tablet Take 20 mEq by mouth 2 (two) times daily.   Yes Historical Provider, MD  nitroGLYCERIN (NITROSTAT) 0.4 MG SL tablet Place 0.4 mg under the tongue every 5 (five)  minutes as needed for chest pain.    Historical Provider, MD   Physical Exam: Filed Vitals:   11/19/12 1230 11/19/12 1235 11/19/12 1236 11/19/12 1237  BP: 109/59 134/59 118/68 116/63  Pulse: 74 73 82 85  Temp:      TempSrc:      Resp:      SpO2: 98%        General: Pleasant alert male looking younger than stated age  Eyes: No scleral icterus or pallor extraocular movements intact  ENT: Poor dentition  Neck: Soft supple no JVP or carotid bruit noted no thyromegaly  Cardiovascular: S1-S2 with frequent PVCs  Respiratory: Clinically clear  Abdomen: Slightly tender in epigastrium  Skin: Lower  extremity edema tracing with some venous changes on the left side, patient has a well healing ulcer over the dorsal in trash of left foot  Musculoskeletal: Grossly intact  Psychiatric: Euthymic  Neurologic: Grossly intact moves all 4 limbs equally no focal deficit noted  Labs on Admission:  Basic Metabolic Panel:  Recent Labs Lab 11/19/12 1129 11/19/12 1202  NA 136 139  K 3.6 3.8  CL 101 106  CO2 22  --   GLUCOSE 121* 120*  BUN 17 18  CREATININE 1.19 1.20  CALCIUM 8.5  --    Liver Function Tests:  Recent Labs Lab 11/19/12 1129  AST 16  ALT 10  ALKPHOS 51  BILITOT 0.6  PROT 7.2  ALBUMIN 3.6    Recent Labs Lab 11/19/12 1129  LIPASE 18   No results found for this basename: AMMONIA,  in the last 168 hours CBC:  Recent Labs Lab 11/19/12 1129 11/19/12 1202  WBC 7.3  --   NEUTROABS 5.4  --   HGB 14.7 15.3  HCT 43.2 45.0  MCV 88.3  --   PLT 234  --    Cardiac Enzymes: No results found for this basename: CKTOTAL, CKMB, CKMBINDEX, TROPONINI,  in the last 168 hours  BNP (last 3 results) No results found for this basename: PROBNP,  in the last 8760 hours CBG: No results found for this basename: GLUCAP,  in the last 168 hours  Radiological Exams on Admission: Dg Abd Acute W/chest  11/19/2012  *RADIOLOGY REPORT*  Clinical Data: Abdominal pain.  Vomiting.   ACUTE ABDOMEN SERIES (ABDOMEN 2 VIEW & CHEST 1 VIEW)  Comparison: Chest x-ray of 05/26/2012.  Findings: Lung volumes are slightly low.  Some linear bibasilar opacities are similar to prior examinations, favored to reflect areas of mild chronic scarring.  No definite acute consolidative airspace disease or pleural effusions.  Pulmonary vasculature is within normal limits.  Heart size is upper limits of normal. Atherosclerosis in the thoracic aorta.  Tortuous thoracic aorta. Postoperative changes of left shoulder arthroplasty.  Supine and upright views of the abdomen demonstrate gas and stool scattered throughout the colon extending to the level of the distal rectum.  No pathologic distension of small bowel is noted. Multiple vertebral body compression fractures are again noted.  IMPRESSION: 1.  Nonobstructive bowel gas pattern. 2.  No pneumoperitoneum. 3.  No radiographic evidence of acute cardiopulmonary disease. 4.  Atherosclerosis.   Original Report Authenticated By: Trudie Reed, M.D.     EKG: Independently reviewed. EKG shows sinus rhythm with PR interval of may be 0.08 potential left axis deviation with PVCs.  Assessment/Plan Active Problems:   HYPERCHOLESTEROLEMIA   DEMENTIA, MILD   HYPERTENSION   CAD   Abdominal pain, left upper quadrant   GI bleed due to NSAIDs   1. Likely upper GI bleed secondary to nonsteroidals + bisphosphonate + Namenda-patient will be kept n.p.o. keep him on IV fluids. Hemoglobin is relatively stable. Will cycle every 8 hourly. GI Adolph Pollack has already been consulted and will see him probably in the morning given his clinical stability. Continue IV fluid 100 cc per hour hold all nonnecessary medications-last colonoscopy 11/23/2011 showed diverticulosis only, EGD showed gastritis 2.11.13-continue IV PPI 40 mg pantoprazole every 12 hourly differential diagnosis would include ischemic colitis, infectious colitis however given his history of use of NSAIDs and history of  nausea vomiting with diarrhea and abdominal pain am more inclined to believe that this is secondary to irritation although cannot be ruled out  completely--patient is a 32 history of chronic abdominal pain and this prompted his studies in the recent past. 2. History CAD-mild to moderate disease per cardiac cath 12 2012-hold aspirin for now 3. Hypercholesterolemia-hold statin for now 4. Chronic thrombocytopenia-unclear etiology-we'll defer management to primary care physician 5. Mild dementia-hold Namenda for now given the fact that this can also cause GI bleed. 6. Arrhythmia-seems like he has more PVCs than anything else. Will get formal EKG.   GI consulted and will see  Code Status: Full Family Communication: Discussed with daughter at bedside  Disposition Plan: 2-3 days  time: 86 min  Mahala Menghini [facility] Pager [phone] If 7PM-7AM, please contact night-coverage [email or web address] Password [email or web address] 11/19/2012, 1:05 PM

## 2012-11-19 NOTE — Telephone Encounter (Signed)
Reviewed my note, hospital records - pt admitted for observation

## 2012-11-19 NOTE — Op Note (Signed)
[facility] [address] [place], [address]   ENDOSCOPY PROCEDURE REPORT  PATIENT: [name], Elliott  MR#: [id number] BIRTHDATE: [birth year] , 84  yrs. old GENDER: Male ENDOSCOPIST: Beverley Fiedler, MD REFERRED BY:  H[facility], [facility] PROCEDURE DATE:  11/19/2012 PROCEDURE:  EGD, diagnostic ASA CLASS:     Class III INDICATIONS:  Hematemesis.   Melena. MEDICATIONS: These medications were titrated to patient response per physician's verbal order, Fentanyl 50 mcg IV, and Versed 4 mg IV TOPICAL ANESTHETIC: Cetacaine Spray  DESCRIPTION OF PROCEDURE: After the risks benefits and alternatives of the procedure were thoroughly explained, informed consent was obtained.  The Pentax Gastroscope [id number] endoscope was introduced through the mouth and advanced to the second portion of the duodenum. Without limitations.  The instrument was slowly withdrawn as the mucosa was fully examined.   ESOPHAGUS: The esophagus was tortuous in the distal third. Multiple small and very superficial non-bleeding clean-based ulcers were found in the lower third of the esophagus.   There was no evidence of recent bleeding  STOMACH: A medium sized erosion was found in the gastric body.   The stomach otherwise appeared normal.  No evidence of recent bleeding  DUODENUM: The duodenal mucosa showed no abnormalities in the bulb and second portion of the duodenum.  Retroflexed views revealed a hiatal hernia.     The scope was then withdrawn from the patient and the procedure completed.  COMPLICATIONS: There were no complications.  ENDOSCOPIC IMPRESSION: 1.   The esophagus was tortuous 2.   Multiple small non-bleeding ulcers were found in the lower third of the esophagus 3.   Erosion in the gastric body (previously biopsied for H. pylori and negative) 4.   The stomach otherwise appeared normal 5.   The duodenal mucosa showed no abnormalities in the bulb and second portion of the  duodenum      RECOMMENDATIONS: 1.  Continue PPI therapy 2.  Avoid NSAIDs other than aspirin 3.  If possible consider discontinuation of alendronate ( if this medication continues patient should remain upright for 90 minutes after taking this medication and drink 4-6 ounces of water with each dose to help with complete transit into the stomach)   eSigned:  Beverley Fiedler, MD 11/19/2012 4:57 PMRevised: 11/19/2012 4:57 PM  [id number] Esther Hardy, MD and The Patient  PATIENT NAME:  [name], Elliott MR#: [id number]

## 2012-11-19 NOTE — ED Notes (Signed)
Dr. Mahala Menghini into see pt.  No changes in pt condition (stable).

## 2012-11-19 NOTE — ED Notes (Signed)
Per report from [facility] pt reported having N/V/D with dark stools and dark emesis.  Pt denies dizziness.  Resp symmetrical and unlabored.

## 2012-11-19 NOTE — Consult Note (Signed)
[facility] Consult: 2:19 PM 11/19/2012   Referring Provider: Mahala Menghini Primary Care Physician:  Illene Regulus, MD Primary Gastroenterologist:  Dr. Arlyce Dice  dtr is [name] at [phone]  Reason for Consultation:  N/v, dark stools.   HPI: [name] Elliott is a 79 y.o. male.  Hx adenomatous poly and gastritis on colonoscopy and EGD in 2013.  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  Admitting from ED today.  Since PM 3/17 having multiple episodes of diarrheal black stools and was vomiting dark material.  Occurred on several occasions.    Hgb is 14.7 - 15.3.  BUN and coags normal.  Vitals with pulse in 70s-80s and BP 109-134/59-71.   Has gotten dose of Protonix 40 mg IV once.   Takes 81 ASA, Fosamax, 40 mg Omeprazole, goodies powders about 3 x weekly for arthritic leg pain. No hx of GI bleed.  No dysphagia.  But does have regurge of parts of meals he may have eaten the previous day.  There is tenderness on Right flank, under front ribs.  No spontaneous pain.   Dizzy "staggery" yesterday when getting to bathroom.  No syncopal events.  Generally daily brown stool.  No anorexia, he is hungry now.  No ETOH.  No unusual bleeding or bruising.  No dysuria or frequency.  Did feel chills and couldn't get warm yesterday.     Past Medical History  Diagnosis Date  . Constipation, chronic   . Sebaceous cyst   . Other malaise and fatigue   . Pain in joint, shoulder region     Has chronic shoulder pain  . Persistent disorder of initiating or maintaining sleep   . Pain in limb   . Thrombocytopenia, unspecified   . Dementia     with periods of amnesia  . Other B-complex deficiencies   . Hypothyroidism   . Pain in limb   . Esophageal reflux   . Unspecified essential hypertension   . Arthropathy, unspecified, site unspecified   . Pure hypercholesterolemia   . Pneumonia 2011  .  Orthostasis   . PVC's (premature ventricular contractions)   . S/P cardiac cath 08/08/11    mild to moderate CAD primarily in the LAD. None are obstructive and appear stable from prior cath in 2007; managed medically  . Arthritis   . CAD (coronary artery disease)     last cath in 2012. Managed medically-some blockages  . Headache   . 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 MRI Nov '11 - tendonosis and partial tear. Left shoulder surgery Jan '12 for partial shoulder replacement. [name])     Past Surgical History  Procedure Laterality Date  . Hand surgery      Right crush injury, right fifth digit contracture, limited  motion  . Lumbar spine surgery      2007, Dr Eloise Harman (Neurosurgeon)  . Knee surgery      age 60, x2  . Cataract extraction  2009  . Rotator cuff repair  8.30.2011  . US echocardiography  02-28-2010    Est EF 50-55%  . Cardiac catheterization      08/2011  . Shoulder hemi-arthroplasty    . Finger amputation      pinky finger right hand  . Hardware removal  02/05/2012    Procedure: HARDWARE REMOVAL;  Surgeon: Eulas Post, MD;  Location: [facility] OR;  Service: Orthopedics;  Laterality: Left;  . Reverse shoulder arthroplasty  02/05/2012  Procedure: REVERSE SHOULDER ARTHROPLASTY;  Surgeon: Eulas Post, MD;  Location: [facility] OR;  Service: Orthopedics;  Laterality: Left;    Prior to Admission medications   Medication Sig Start Date End Date Taking? Authorizing Provider  acetaminophen (TYLENOL) 500 MG tablet Take 1,000 mg by mouth every 6 (six) hours as needed for pain.   Yes Historical Provider, MD  alendronate (FOSAMAX) 70 MG tablet Take 70 mg by mouth every 7 (seven) days. Takes on Tuesdays. Take with a full glass of water on an empty stomach.   Yes Historical Provider, MD  aspirin EC 81 MG tablet Take 81 mg by mouth daily.   Yes Historical Provider, MD  Aspirin-Acetaminophen-Caffeine (GOODY HEADACHE PO) Take 1 packet by mouth daily as needed  (for headache).   Yes Historical Provider, MD  furosemide (LASIX) 40 MG tablet Take 40 mg by mouth 2 (two) times daily.   Yes Historical Provider, MD  gabapentin (NEURONTIN) 300 MG capsule Take 300 mg by mouth 3 (three) times daily.   Yes Historical Provider, MD  GLUCOSAMINE PO Take 1 tablet by mouth daily.   Yes Historical Provider, MD  isosorbide mononitrate (IMDUR) 60 MG 24 hr tablet Take 60 mg by mouth daily.   Yes Historical Provider, MD  omeprazole (PRILOSEC) 20 MG capsule Take 40 mg by mouth daily.   Yes Historical Provider, MD  potassium chloride SA (K-DUR,KLOR-CON) 20 MEQ tablet Take 20 mEq by mouth 2 (two) times daily.   Yes Historical Provider, MD  nitroGLYCERIN (NITROSTAT) 0.4 MG SL tablet Place 0.4 mg under the tongue every 5 (five) minutes as needed for chest pain.    Historical Provider, MD    Scheduled Meds: . pantoprazole (PROTONIX) IV  40 mg Intravenous Q12H   Infusions: . sodium chloride    . sodium chloride     PRN Meds: ondansetron (ZOFRAN) IV   Allergies as of 11/19/2012 - Review Complete 11/19/2012  Allergen Reaction Noted  . Crestor (rosuvastatin calcium)  05/14/2011    Family History  Problem Relation Age of Onset  . Breast cancer Mother   . Cancer Brother     throat  . Diabetes Daughter   . Colon cancer Neg Hx   . Kidney disease Father     History   Social History  . Marital Status: Widowed since 1995.     Spouse Name: N/A    Number of Children: 2  . Years of Education: N/A   Occupational History  . retired    Social History Main Topics  . Smoking status: Former Smoker    Types: Cigars    Quit date: 09/03/1978  . Smokeless tobacco: Current User    Types: Chew:1.5 oz per day.      Comment: occasional cigar. tobacco info given 10/10/2011  . Alcohol Use: No  . Drug Use: No  . Sexually Active: Not on file   Other Topics Concern  . Not on file   Social History Narrative   2nd grade education   Married '58, '95   2 dtr '59, '64,  youngest daughter died septic kidney   Lives alone, handles all adls      Regular Exercise -  NO      Daily caffine    REVIEW OF SYSTEMS: Per HPI No sore throat.  No chest pressure or pain.  Chronic LE edema.  Pain in legs frequently.  No falls.  No seizures.  No blurry vision.  No polydipsia or polyuria.  No nose bleeds.  Chews 1.5 oz tobacco   PHYSICAL EXAM: Vital signs in last 24 hours: Temp:  [99.1 F (37.3 C)] 99.1 F (37.3 C) (03/19 1127) Pulse Rate:  [73-85] 85 (03/19 1237) Resp:  [18] 18 (03/19 1127) BP: (109-134)/(59-71) 116/63 mmHg (03/19 1237) SpO2:  [94 %-100 %] 98 % (03/19 1230)  General: pleasant, elderly, comfortable, well-appearing WM. Head:  No asymmetry or swelling  Eyes:  No icterus Ears:  HOH  Nose:  No discharge Mouth:  No teeth.  Moist, clear oral MM.  Tobacco stained tongue.   Neck:  No JVD or mass.   Lungs:  Clear B.  No cough, no dyspnea Heart: RRR with occasional PVC. Abdomen:  Soft, NT, ND.  No HSM, no mass.   Rectal: deferred   Musc/Skeltl: no joint redness or swellilng Extremities:  Slight, non pitting pedal edema  Neurologic:  Pleasant,  No tremor.  Not confused.  Oriented x 3.  Moves all 4s.  Strength grossly intact Skin:  No rash or sores.  Broken blood vessels on skin of feet and ankles Tattoos:  none Nodes:  No groin adenopathy   Psych:  Pleasant, cooperative.   Intake/Output from previous day:   Intake/Output this shift:    LAB RESULTS:  Recent Labs  11/19/12 1129 11/19/12 1202  WBC 7.3  --   HGB 14.7 15.3  HCT 43.2 45.0  PLT 234  --    BMET Lab Results  Component Value Date   NA 139 11/19/2012   NA 136 11/19/2012   NA 139 02/06/2012   K 3.8 11/19/2012   K 3.6 11/19/2012   K 3.7 02/06/2012   CL 106 11/19/2012   CL 101 11/19/2012   CL 102 02/06/2012   CO2 22 11/19/2012   CO2 25 02/06/2012   CO2 26 01/30/2012   GLUCOSE 120* 11/19/2012   GLUCOSE 121* 11/19/2012   GLUCOSE 127* 02/06/2012   BUN 18 11/19/2012   BUN 17 11/19/2012    BUN 14 02/06/2012   CREATININE 1.20 11/19/2012   CREATININE 1.19 11/19/2012   CREATININE 1.20 02/06/2012   CALCIUM 8.5 11/19/2012   CALCIUM 8.7 02/06/2012   CALCIUM 9.6 01/30/2012   LFT  Recent Labs  11/19/12 1129  PROT 7.2  ALBUMIN 3.6  AST 16  ALT 10  ALKPHOS 51  BILITOT 0.6   PT/INR Lab Results  Component Value Date   INR 1.14 11/19/2012   INR 1.00 01/30/2012   INR 1.0 08/03/2011    RADIOLOGY STUDIES: Dg Abd Acute W/chest 11/19/2012   Findings: Lung volumes are slightly low.  Some linear bibasilar opacities are similar to prior examinations, favored to reflect areas of mild chronic scarring.  No definite acute consolidative airspace disease or pleural effusions.  Pulmonary vasculature is within normal limits.  Heart size is upper limits of normal. Atherosclerosis in the thoracic aorta.  Tortuous thoracic aorta. Postoperative changes of left shoulder arthroplasty.  Supine and upright views of the abdomen demonstrate gas and stool scattered throughout the colon extending to the level of the distal rectum.  No pathologic distension of small bowel is noted. Multiple vertebral body compression fractures are again noted.  IMPRESSION: 1.  Nonobstructive bowel gas pattern. 2.  No pneumoperitoneum. 3.  No radiographic evidence of acute cardiopulmonary disease. 4.  Atherosclerosis.   Original Report Authenticated By: Trudie Reed, M.D.     ENDOSCOPIC STUDIES: 10/2011  EGD  Leone Payor Gastritis.  Pathology:   MODERATE CHRONIC ACTIVE GASTRITIS WITH SURFACE EROSION. - NO EVIDENCE OF  HELICOBACTER PYLORI, INTESTINAL METAPLASIA, DYSPLASIA OR MALIGNANCY.  11/2011   Colonoscopy  4mm sessile polyp ascending colon.  Path:  Tubular adenoma  IMPRESSION: *  Presumed upper GI bleed.  Rule out ulcer, MWT, AVMs.  He does take daily PPI which ought to prevent ulcers but this med may not be protective of ulcers given use of Goodie's 3 days per week added to the daily 81 mg ASA.  Fosamax can also cause ulcers.  *   Hx gastritis in 2013.  Hx colon polyp in 2013.   PLAN: *  EGD today.  Pt ok with this.   Continue the BID, IV Protonix for now.    LOS: 0 days   Jennye Moccasin  11/19/2012, 2:19 PM Pager: [phone]

## 2012-11-19 NOTE — ED Provider Notes (Addendum)
History     CSN: [id number]  Arrival date & time 11/19/12  1106   First MD Initiated Contact with Patient 11/19/12 1115      Chief Complaint  Patient presents with  . GI Bleeding    (Consider location/radiation/quality/duration/timing/severity/associated sxs/prior treatment) HPI Comments: Patient presents via EMS with a two-day history of dark loose stools and dark appearing emesis. Family was concerned this represent blood. You do not see any bright red blood. Patient denies any abdominal pain, dizziness or lightheadedness. No history of fever. Today he was complaining of some right-sided chest pain that is reproducible to palpation. He's not on any anticoagulation. No previous history of GI bleed. He takes chronic aspirin denies alcohol use. Saw his PCP 2 days ago and did not have these complaints.  The history is provided by the patient and the EMS personnel. The history is limited by the condition of the patient.    Past Medical History  Diagnosis Date  . Constipation, chronic   . Sebaceous cyst   . Other malaise and fatigue   . Pain in joint, shoulder region     Has chronic shoulder pain  . Persistent disorder of initiating or maintaining sleep   . Pain in limb   . Thrombocytopenia, unspecified   . Dementia     with periods of amnesia  . Other B-complex deficiencies   . Hypothyroidism   . Pain in limb   . Esophageal reflux   . Unspecified essential hypertension   . Arthropathy, unspecified, site unspecified   . Pure hypercholesterolemia   . Pneumonia 2011  . Orthostasis   . PVC's (premature ventricular contractions)   . S/P cardiac cath 08/08/11    mild to moderate CAD primarily in the LAD. None are obstructive and appear stable from prior cath in 2007; managed medically  . Arthritis   . CAD (coronary artery disease)     last cath in 2012. Managed medically-some blockages  . Headache   . 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 MRI Nov '11  - tendonosis and partial tear. Left shoulder surgery Jan '12 for partial shoulder replacement. [name])     Past Surgical History  Procedure Laterality Date  . Hand surgery      Right crush injury, right fifth digit contracture, limited  motion  . Lumbar spine surgery      2007, Dr Eloise Harman (Neurosurgeon)  . Knee surgery      age 15, x2  . Cataract extraction  2009  . Rotator cuff repair  8.30.2011  . US echocardiography  02-28-2010    Est EF 50-55%  . Cardiac catheterization      08/2011  . Shoulder hemi-arthroplasty    . Finger amputation      pinky finger right hand  . Hardware removal  02/05/2012    Procedure: HARDWARE REMOVAL;  Surgeon: Eulas Post, MD;  Location: [facility] OR;  Service: Orthopedics;  Laterality: Left;  . Reverse shoulder arthroplasty  02/05/2012    Procedure: REVERSE SHOULDER ARTHROPLASTY;  Surgeon: Eulas Post, MD;  Location: [facility] OR;  Service: Orthopedics;  Laterality: Left;    Family History  Problem Relation Age of Onset  . Breast cancer Mother   . Cancer Brother     throat  . Diabetes Daughter   . Colon cancer Neg Hx   . Kidney disease Father     History  Substance Use Topics  . Smoking status: Former Smoker    Types:  Cigars    Quit date: 09/03/1978  . Smokeless tobacco: Current User    Types: Chew     Comment: occasional cigar. tobacco info given 10/10/2011  . Alcohol Use: No      Review of Systems  Constitutional: Negative for fever, activity change and appetite change.  Respiratory: Negative for cough, chest tightness and shortness of breath.   Cardiovascular: Positive for chest pain.  Gastrointestinal: Positive for nausea, vomiting, diarrhea and blood in stool. Negative for abdominal pain.  Genitourinary: Negative for dysuria and hematuria.  Musculoskeletal: Negative for back pain.  Skin: Negative for rash.  Neurological: Negative for dizziness, weakness and headaches.  A complete 10 system review of systems was obtained and all  systems are negative except as noted in the HPI and PMH.    Allergies  Crestor  Home Medications   No current outpatient prescriptions on file.  BP 120/66  Pulse 77  Temp(Src) 97.9 F (36.6 C) (Oral)  Resp 15  Ht 5\' 6"  (1.676 m)  SpO2 96%  Physical Exam  Constitutional: He is oriented to person, place, and time. He appears well-developed and well-nourished. No distress.  HENT:  Head: Normocephalic and atraumatic.  Mouth/Throat: Oropharynx is clear and moist. No oropharyngeal exudate.  Eyes: Conjunctivae and EOM are normal. Pupils are equal, round, and reactive to light.  Neck: Normal range of motion. Neck supple.  Cardiovascular: Normal rate, regular rhythm and normal heart sounds.   No murmur heard. Pulmonary/Chest: Effort normal and breath sounds normal. No respiratory distress.  Abdominal: Soft. There is no tenderness. There is no rebound and no guarding.  Genitourinary:  No hemorrhoids or fissures  Musculoskeletal: Normal range of motion. He exhibits no edema and no tenderness.  Neurological: He is alert and oriented to person, place, and time. No cranial nerve deficit. He exhibits normal muscle tone. Coordination normal.  Skin: Skin is warm.    ED Course  Procedures (including critical care time)  Labs Reviewed  COMPREHENSIVE METABOLIC PANEL - Abnormal; Notable for the following:    Glucose, Bld 121 (*)    GFR calc non Af Amer 55 (*)    GFR calc Af Amer 64 (*)    All other components within normal limits  OCCULT BLOOD, POC DEVICE - Abnormal; Notable for the following:    Fecal Occult Bld POSITIVE (*)    All other components within normal limits  POCT I-STAT, CHEM 8 - Abnormal; Notable for the following:    Glucose, Bld 120 (*)    Calcium, Ion 1.08 (*)    All other components within normal limits  CBC WITH DIFFERENTIAL  LIPASE, BLOOD  PROTIME-INR  APTT  LACTIC ACID, PLASMA  TYPE AND SCREEN   Dg Abd Acute W/chest  11/19/2012  *RADIOLOGY REPORT*  Clinical  Data: Abdominal pain.  Vomiting.  ACUTE ABDOMEN SERIES (ABDOMEN 2 VIEW & CHEST 1 VIEW)  Comparison: Chest x-ray of 05/26/2012.  Findings: Lung volumes are slightly low.  Some linear bibasilar opacities are similar to prior examinations, favored to reflect areas of mild chronic scarring.  No definite acute consolidative airspace disease or pleural effusions.  Pulmonary vasculature is within normal limits.  Heart size is upper limits of normal. Atherosclerosis in the thoracic aorta.  Tortuous thoracic aorta. Postoperative changes of left shoulder arthroplasty.  Supine and upright views of the abdomen demonstrate gas and stool scattered throughout the colon extending to the level of the distal rectum.  No pathologic distension of small bowel is noted. Multiple vertebral  body compression fractures are again noted.  IMPRESSION: 1.  Nonobstructive bowel gas pattern. 2.  No pneumoperitoneum. 3.  No radiographic evidence of acute cardiopulmonary disease. 4.  Atherosclerosis.   Original Report Authenticated By: Trudie Reed, M.D.      1. GI bleeding       MDM  Dark-colored emesis and stools for the past day associated with nausea and vomiting. No abdominal pain, lightheadedness or dizziness. Right-sided reproducible chest pain.  Hemoglobin stable at 14. Orthostatics positive by heart rate.  Patient had EGD in 2013 which showed gastritis. Colonoscopy in 2013 showed diverticulosis and one polyp. Concern for worsening gastritis versus ulcer given patient's bisphosphanate use and NSAID use. Patient is a hemodynamically stable. Will admit for observation. D/w Dr. Mahala Menghini.    Date: 11/19/2012  Rate: 69  Rhythm: normal sinus rhythm  QRS Axis: normal  Intervals: normal  ST/T Wave abnormalities: nonspecific ST/T changes  Conduction Disutrbances:none  Narrative Interpretation:   Old EKG Reviewed: changes noted   Glynn Octave, MD 11/19/12 1551  Glynn Octave, MD 11/19/12 1610

## 2012-11-19 NOTE — Telephone Encounter (Signed)
Emergent call from [name] [phone] regarding black vomit and black stools.  Caller not with [facility] Putt at time of call.  Left message on answering machine to call back for triage, if still needed.

## 2012-11-19 NOTE — Consult Note (Signed)
Patient seen, examined, and I agree with the above documentation, including the assessment and plan. Coffee ground emesis, melena, Hgb stable, not on blood thinners, uses OTC NSAIDs, hx of gastritis EGD today The nature of the procedure, as well as the risks, benefits, and alternatives were carefully and thoroughly reviewed with the patient. Ample time for discussion and questions allowed. The patient understood, was satisfied, and agreed to proceed.

## 2012-11-20 ENCOUNTER — Telehealth: Payer: Self-pay | Admitting: Internal Medicine

## 2012-11-20 ENCOUNTER — Encounter (HOSPITAL_COMMUNITY): Payer: Self-pay | Admitting: Internal Medicine

## 2012-11-20 DIAGNOSIS — K922 Gastrointestinal hemorrhage, unspecified: Secondary | ICD-10-CM

## 2012-11-20 DIAGNOSIS — I10 Essential (primary) hypertension: Secondary | ICD-10-CM

## 2012-11-20 DIAGNOSIS — I2510 Atherosclerotic heart disease of native coronary artery without angina pectoris: Secondary | ICD-10-CM

## 2012-11-20 MED ORDER — PANTOPRAZOLE SODIUM 40 MG PO TBEC
40.0000 mg | DELAYED_RELEASE_TABLET | Freq: Two times a day (BID) | ORAL | Status: DC
  Administered 2012-11-20 – 2012-11-21 (×3): 40 mg via ORAL
  Filled 2012-11-20 (×2): qty 1
  Filled 2012-11-20: qty 2

## 2012-11-20 MED ORDER — TRAMADOL HCL 50 MG PO TABS
50.0000 mg | ORAL_TABLET | Freq: Four times a day (QID) | ORAL | Status: DC | PRN
  Administered 2012-11-20: 50 mg via ORAL
  Filled 2012-11-20: qty 1

## 2012-11-20 MED ORDER — ISOSORBIDE MONONITRATE ER 60 MG PO TB24
60.0000 mg | ORAL_TABLET | Freq: Every day | ORAL | Status: DC
  Administered 2012-11-20 – 2012-11-21 (×2): 60 mg via ORAL
  Filled 2012-11-20 (×2): qty 1

## 2012-11-20 NOTE — Telephone Encounter (Signed)
Called daughter

## 2012-11-20 NOTE — Progress Notes (Signed)
Subjective: [name] Elliott was recently seen in the office for a small ulcer left lateral ankle. He was stable with no GI complaints at that time. He subsequently developed hematemsis and melena and has been admitted with GI bleed. His Hgb has been stable without drop.   He has been seen by Dr. Rhea Belton for GI - came to EGD: ENDOSCOPIC IMPRESSION:  1. The esophagus was tortuous  2. Multiple small non-bleeding ulcers were found in the lower  third of the esophagus  3. Erosion in the gastric body (previously biopsied for H. pylori  and negative)  4. The stomach otherwise appeared normal  5. The duodenal mucosa showed no abnormalities in the bulb and  second portion of the duodenum  He is awake and comfortable. Let me know he suffered Tuesday and Wednesday.   Objective: Lab: Lab Results  Component Value Date   WBC 7.3 11/19/2012   HGB 15.3 11/19/2012   HCT 45.0 11/19/2012   MCV 88.3 11/19/2012   PLT 234 11/19/2012   BMET    Component Value Date/Time   NA 139 11/19/2012 1202   K 3.8 11/19/2012 1202   CL 106 11/19/2012 1202   CO2 22 11/19/2012 1129   GLUCOSE 120* 11/19/2012 1202   BUN 18 11/19/2012 1202   CREATININE 1.20 11/19/2012 1202   CALCIUM 8.5 11/19/2012 1129   GFRNONAA 55* 11/19/2012 1129   GFRAA 64* 11/19/2012 1129     Imaging:11/19/12 Acute adbominal series: IMPRESSION:  1. Nonobstructive bowel gas pattern.  2. No pneumoperitoneum.  3. No radiographic evidence of acute cardiopulmonary disease.  4. Atherosclerosis.   Scheduled Meds: . sodium chloride   Intravenous STAT  . pantoprazole (PROTONIX) IV  40 mg Intravenous Q12H  . potassium chloride SA  20 mEq Oral BID  . sodium chloride  3 mL Intravenous Q12H   Continuous Infusions: . sodium chloride    . sodium chloride 100 mL/hr at 11/20/12 0326   PRN Meds:.morphine injection, promethazine   Physical Exam: Filed Vitals:   11/20/12 0550  BP: 108/52  Pulse: 58  Temp: 98  Resp: 20   Gen'l - elderly white man in no  distress HEENT- C&S clear Cor - regular rate, 2+ radial pulse Pulm - unlabored respirations Abd - soft, BS+, no guarding or rebound Neuro - at his baseline.     Assessment/Plan: 1. GI - UGI bleed with esophageal erosions and gastric erosion - w/o active bleeding. Hgb has been stable Plan Give a diet  F/u H/H in AM  PPI therapy, d/c bisphosphonate  2. Cardiac - appears stable. 12 Lead EKG with NSR. BP stable Plan D/c tele  Dispo - home in AM  [employer] IM (o) [phone]; (c) [phone] Call-grp - [facility] IM  Tele: [phone]  11/20/2012, 6:44 AM

## 2012-11-20 NOTE — Telephone Encounter (Signed)
[name] Elliott would like a call about her father.  She would like to know why he is staying in the hospital instead of going home today.

## 2012-11-20 NOTE — Plan of Care (Signed)
Problem: Phase I Progression Outcomes Goal: Initial discharge plan identified Outcome: Completed/Met Date Met:  11/20/12 To return home     

## 2012-11-20 NOTE — Care Management Note (Signed)
    Page 1 of 1   11/21/2012     1:57:05 PM   CARE MANAGEMENT NOTE 11/21/2012  Patient:  [name] Elliott, [name] Elliott   Account Number:  [id number]  Date Initiated:  11/20/2012  Documentation initiated by:  Letha Cape  Subjective/Objective Assessment:   dx gib  admit- lives alone.     Action/Plan:   Anticipated DC Date:  11/21/2012   Anticipated DC Plan:  HOME/SELF CARE      DC Planning Services  CM consult      Choice offered to / List presented to:             Status of service:  Completed, signed off Medicare Important Message given?   (If response is "NO", the following Medicare IM given date fields will be blank) Date Medicare IM given:   Date Additional Medicare IM given:    Discharge Disposition:  HOME/SELF CARE  Per UR Regulation:  Reviewed for med. necessity/level of care/duration of stay  If discussed at Long Length of Stay Meetings, dates discussed:    Comments:  11/21/12 13:56 Letha Cape RN, BSN [phone] patient for dc today, no needs anticipated.  11/20/12 15:16 Letha Cape RN, BSN [phone] patient lives alone, pta indep, patient still driving himself to [facility] apts.  Patient has medication coverage.  NCM will continue to follow for dc needs.

## 2012-11-20 NOTE — Progress Notes (Signed)
Pt reports that he was having left leg pain. RN ordered tramadol- RN returned to give medication and pt was asleep. Will continue to monitor

## 2012-11-21 ENCOUNTER — Telehealth: Payer: Self-pay

## 2012-11-21 ENCOUNTER — Other Ambulatory Visit: Payer: Self-pay | Admitting: *Deleted

## 2012-11-21 DIAGNOSIS — R1012 Left upper quadrant pain: Secondary | ICD-10-CM

## 2012-11-21 DIAGNOSIS — I10 Essential (primary) hypertension: Secondary | ICD-10-CM

## 2012-11-21 MED ORDER — OMEPRAZOLE 20 MG PO CPDR: 40.0000 mg | DELAYED_RELEASE_CAPSULE | Freq: Two times a day (BID) | ORAL | Status: DC

## 2012-11-21 NOTE — Telephone Encounter (Signed)
An ABI report from 2012 was requested. This is not available. Pt did not show up for that appt.

## 2012-11-21 NOTE — Progress Notes (Signed)
Subjective:  No recurrent bleeding. Feeling good  Objective: Lab: Lab Results  Component Value Date   WBC 7.3 11/19/2012   HGB 12.6* 11/21/2012   HCT 36.3* 11/21/2012   MCV 88.3 11/19/2012   PLT 234 11/19/2012   BMET    Component Value Date/Time   NA 139 11/19/2012 1202   K 3.8 11/19/2012 1202   CL 106 11/19/2012 1202   CO2 22 11/19/2012 1129   GLUCOSE 120* 11/19/2012 1202   BUN 18 11/19/2012 1202   CREATININE 1.20 11/19/2012 1202   CALCIUM 8.5 11/19/2012 1129   GFRNONAA 55* 11/19/2012 1129   GFRAA 64* 11/19/2012 1129     Imaging:  Scheduled Meds: . isosorbide mononitrate  60 mg Oral Daily  . pantoprazole  40 mg Oral BID  . potassium chloride SA  20 mEq Oral BID  . sodium chloride  3 mL Intravenous Q12H   Continuous Infusions: . sodium chloride    . sodium chloride 100 mL/hr at 11/21/12 0348   PRN Meds:.promethazine, traMADol   Physical Exam: Filed Vitals:   11/21/12 0523  BP: 119/73  Pulse: 63  Temp: 98.5 F (36.9 C)  Resp: 18    See dictation    Assessment/Plan: For d/c home with follow up march 26th Dictated #[id number]   Ill[facility] Regulus [place] [facility] (o) [phone]; (c) [phone] Call-grp - [facility] IM  Tele: [phone]  11/21/2012, 11:10 AM

## 2012-11-21 NOTE — Progress Notes (Signed)
I called a spoke to [facility] medical records [phone]) and the report will be faxed over.

## 2012-11-21 NOTE — Progress Notes (Signed)
NURSING PROGRESS NOTE  [name] Elliott [id number] Discharge Data: 11/21/2012 12:15 PM Attending Provider: Jacques Navy, MD [id number] Debby Bud, MD     [name] to be D/C'd Home per MD order.  Discussed with the patient the After Visit Summary and all questions fully answered. All IV's discontinued with no bleeding noted. All belongings returned to patient for patient to take home.   Last Vital Signs:  Blood pressure 119/73, pulse 63, temperature 98.5 F (36.9 C), temperature source Oral, resp. rate 18, height 5\' 6"  (1.676 m), weight 72.349 kg (159 lb 8 oz), SpO2 93.00%.  Discharge Medication List   Medication List    STOP taking these medications       alendronate 70 MG tablet  Commonly known as:  FOSAMAX     GOODY HEADACHE PO      TAKE these medications       acetaminophen 500 MG tablet  Commonly known as:  TYLENOL  Take 1,000 mg by mouth every 6 (six) hours as needed for pain.     aspirin EC 81 MG tablet  Take 81 mg by mouth daily.     furosemide 40 MG tablet  Commonly known as:  LASIX  Take 40 mg by mouth 2 (two) times daily.     gabapentin 300 MG capsule  Commonly known as:  NEURONTIN  Take 300 mg by mouth 3 (three) times daily.     GLUCOSAMINE PO  Take 1 tablet by mouth daily.     isosorbide mononitrate 60 MG 24 hr tablet  Commonly known as:  IMDUR  Take 60 mg by mouth daily.     nitroGLYCERIN 0.4 MG SL tablet  Commonly known as:  NITROSTAT  Place 0.4 mg under the tongue every 5 (five) minutes as needed for chest pain.     omeprazole 20 MG capsule  Commonly known as:  PRILOSEC  Take 2 capsules (40 mg total) by mouth 2 (two) times daily.     potassium chloride SA 20 MEQ tablet  Commonly known as:  K-DUR,KLOR-CON  Take 20 mEq by mouth 2 (two) times daily.

## 2012-11-22 ENCOUNTER — Other Ambulatory Visit: Payer: Self-pay | Admitting: Internal Medicine

## 2012-11-22 MED ORDER — OXYCODONE-ACETAMINOPHEN 5-325 MG PO TABS: 1.0000 | ORAL_TABLET | Freq: Three times a day (TID) | ORAL | Status: DC | PRN

## 2012-11-22 MED ORDER — HYDROCODONE-ACETAMINOPHEN 5-325 MG PO TABS: 1.0000 | ORAL_TABLET | Freq: Four times a day (QID) | ORAL | Status: AC | PRN

## 2012-11-22 NOTE — Telephone Encounter (Signed)
Patients daughter Agustin Cree called stating that patient was released from the hospital yesterday by Dr. Debby Bud. She says that patient was supposed to have a prescription for oxycodone but was never given the prescription.

## 2012-11-22 NOTE — Telephone Encounter (Signed)
Called daughter back and she said the med is for pt's leg pain, I advise Dr. Reece Agar who advise me to have pt's daughter bring the check out papers up here to show were it said take pain med, daughter advise me she doesn't have the check out papers so Dr. Reece Agar advise me to tell the pt's daughter she is going to have to call Dr. Debby Bud office on Monday to get pain med, daughter became very upset and said that she is changing doctors and hung up

## 2012-11-22 NOTE — Telephone Encounter (Signed)
called twice f/u with pt - no answer.

## 2012-11-22 NOTE — Addendum Note (Signed)
Addended by: Eustaquio Boyden on: 11/22/2012 02:01 PM   Modules accepted: Orders

## 2012-11-22 NOTE — Discharge Summary (Signed)
[name] Elliott, CODNER NO.:  [id number]  MEDICAL RECORD NO.:  [id number]  LOCATION:  5505                         FACILITY:  [facility]  PHYSICIAN:  Rosalyn Gess. Norins, MD  DATE OF BIRTH:  [birth year]  DATE OF ADMISSION:  11/19/2012 DATE OF DISCHARGE:  11/21/2012                              DISCHARGE SUMMARY   CONSULTANTS:  Erick Blinks, M.D. for GI.  ADMITTING DIAGNOSIS:  Upper gastrointestinal with hematochezia and melena.  DISCHARGE DIAGNOSIS:  Esophageal ulceration and gastric ulcer.  PROCEDURES:  Imaging, the patient had acute abdominal series, November 19, 2012 which showed nonobstructive bowel gas pattern.  No pneumoperitoneum.  No radiographic evidence of acute cardiopulmonary disease.    GI procedure, upper endoscopy November 19, 2012, which revealed tortuous esophagus.  Multiple small nonbleeding ulcers were found on the lower third of the esophagus.  Erosion in the gastric body.  Stomach was otherwise normal.  Duodenal mucosa showed no abnormalities.  HISTORY OF PRESENT ILLNESS:  [name] is an 79 year old gentleman who had been seen in the office recently for an small ulcer on his foot and was in good health at that time.  He subsequently developed a problem with the diarrhea with dark or black colored stool.  He also reported it look like coffee grounds in his stool.  He also developed some emesis and that the material was black in color.  The patient reports he had significant pain in the right upper quadrant.  The patient does take Marlin Canary Powders 2 to 3 times a week.  He has also been taking alendronate. In the emergency department, the patient was found to be heme positive, although he had a stable hemoglobin.  He was subsequently admitted with a possible upper GI bleed.  Please see the H and P for past medical history, family history, social history, and admission exam.  HOSPITAL COURSE:  GI:  The patient's hemoglobin had remained fairly stable.   He was seen by Dr. Rhea Belton per the GI Service and taken to endoscopy with results as noted.  The patient's abdominal pain resolved. He was able to take a diet.  Follow up hemoglobin did reveal a significant drop in hemoglobin going from 15.3 to 12.6, which may be in part due to both blood loss and IV hydration.  The patient did not have any reported or observed additional episodes of hematemesis or melena. With the patient being hemodynamically stable, with no signs of active ongoing bleeding, was unable to take a diet at this point, he is ready for discharge to home.  DISCHARGE EXAMINATION:  VITAL SIGNS:  Temperature was 98.5, blood pressure 119/73, pulse is 68, respirations 18, oxygen saturation was 93% on room air.  GENERAL APPEARANCE:  This is a pleasant elderly gentleman, in no acute distress.  HEENT:  Exam revealed conjunctiva sclerae to be clear.  CARDIOVASCULAR EXAM:  2+ radial pulses.  Precordium was quiet. He had a regular rate and rhythm.  PULMONARY:  The patient is in normal respirations, no increased work of breathing.  No rales or wheezes. ABDOMEN:  Bowel sounds are positive in all 4 quadrants.  No guarding or rebound.  No tenderness is appreciated.  NEUROLOGIC:  The patient is awake and alert.  He is oriented to person, place, time and context.  At baseline, he is hard of hearing.  LABORATORY DATA:  Hemoglobin on the day of discharge was 12.6 g. Chemistries on the day of admission, were unremarkable with a creatinine of 1.2, BUN of 18,.  Glucose was 120.  Lactic acid at admission was normal at 1.3.  DISPOSITION:  The patient is discharged to home.  He will have close follow up on November 26, 2012, following this cardiology evaluation.  DISCHARGE MEDICATIONS:  Medications at discharge, Tylenol 500 mg 2 tablets q.6h as needed.  We will discontinue Fosamax.  Aspirin 81 mg once daily.  No other aspirin products.  Furosemide 40 mg b.i.d. Neurontin 300 mg t.i.d., glucosamine 1  tablet daily. Imdur 60 mg once daily.  Sublingual nitroglycerin p.r.n.  Omeprazole will be modified to 40 mg b.i.d.  Potassium 20 mEq daily.  DISPOSITION:  The patient is discharged to home with followup on the 26th is noted.  The patient's condition at time of discharge and dictation is stable and improved.     Rosalyn Gess Norins, MD     MEN/MEDQ  D:  11/21/2012  T:  11/22/2012  Job:  [id number]  cc:   Erick Blinks, MD

## 2012-11-22 NOTE — Telephone Encounter (Signed)
Finally able to reach daughter. She states he has chronic leg pain, prior taking goody powders and tylenol for this. rec stopping goody powders given recent hospitalization with upper GI bleed. Tylenol not cutting it. I will call in temporary prescription to [facility] [place] for hydrocodone, daughter aware. Advised daughter to call PCP office on Monday for f/u plan for pain management.

## 2012-11-22 NOTE — Telephone Encounter (Signed)
Last oxycodone 10/325mg  refill was 02/2012. Records reviewed. No mention of this in D/C summary. Can we call family - what was reason for oxycodone?   If to control abd pain, they may pass by Saturday clinic to pick up temporary script of oxycodone but will need to f/u with PCP on Monday.

## 2012-11-22 NOTE — Telephone Encounter (Signed)
Do not see a Rx for this med, ok to fill?

## 2012-11-24 ENCOUNTER — Telehealth: Payer: Self-pay | Admitting: Family Medicine

## 2012-11-25 ENCOUNTER — Telehealth: Payer: Self-pay | Admitting: Internal Medicine

## 2012-11-25 ENCOUNTER — Telehealth: Payer: Self-pay | Admitting: Family Medicine

## 2012-11-25 NOTE — Telephone Encounter (Signed)
Per previous note: it is ok to call in a refill on the Norco as listed on patient's med list.

## 2012-11-25 NOTE — Telephone Encounter (Signed)
Med list show Norco as a regular med: he may take this every 6 hours as needed for pain. May refill Rx if needed.

## 2012-11-25 NOTE — Telephone Encounter (Signed)
Opened in error

## 2012-11-25 NOTE — Telephone Encounter (Signed)
Patient Information:  Caller Name: [name] Elliott  Phone: [phone]  Patient: [name] Elliott, [name] Elliott  Gender: Male  DOB: 02-06-1928  Age: 79 Years  PCP: Illene Regulus (Adults only)  Office Follow Up:  Does the office need to follow up with this patient?: Yes  Instructions For The Office: please call Daughter and advise   Symptoms  Reason For Call & Symptoms: Pt was dx with bleeding ulcers last week at the hospital  Wed -Friday 11/21/12 - [name]. Pt was sent home with Tylenol. Daughter called the office on Sat and Dr. Buena Irish called in 10 Codeine pills which the pt has taken. The pt is still in pain - with his legs. Pt can barely walk at times. Pt had seen Dr. Debby Bud earlier that week for leg pain. Daughter is asking if the pt can be prescribed an additional days worth of Codeine for the pain until he can be seen . Pt has an appt tomorrow.  Reviewed Health History In EMR: Yes  Reviewed Medications In EMR: Yes  Reviewed Allergies In EMR: Yes  Reviewed Surgeries / Procedures: Yes  Date of Onset of Symptoms: 11/17/2012  Guideline(s) Used:  Leg Pain  Disposition Per Guideline:   See Within 3 Days in Office  Reason For Disposition Reached:   Moderate pain (e.g., interferes with normal activities, limping) and present > 3 days  Advice Given:  N/A  RN Overrode Recommendation:  Patient Requests Prescription  Daughter is asking if pt can be prescribed 24hr pain med until appt tomorrow.

## 2012-11-25 NOTE — Telephone Encounter (Signed)
Attempted transitional care call, no answer.  Will attempt to call again. 

## 2012-11-25 NOTE — Telephone Encounter (Signed)
Transitional Care Call:  Spoke with pt's daughter, Agustin Cree.  Discharged on 11/21/12.  D/C WG:[id number] ulceration and gastric ulcer.  Daughter is very upset stating that pt is in a lot of pain, so much that he can barely walk.  She said she called and spoke with a nurse today ([facility]) and told them that he needed more than tylenol for pain and that he had taken all of the medication Dr. Sharen Hones called in on Saturday.  When asked if pt could take the Norco that is already prescribed to him (per Dr. Alvera Novel response to [facility] note) she said he does not have anymore.  She would like for pt to have additional pain medication called in.  Verbalizes understanding of hospital d/c instructions and med list.  Hospital Follow up appts scheduled on 3/26 @830  with Dr. Elease Hashimoto and @1030  with Dr. Debby Bud.

## 2012-11-26 ENCOUNTER — Ambulatory Visit (INDEPENDENT_AMBULATORY_CARE_PROVIDER_SITE_OTHER): Payer: Medicare Other | Admitting: Cardiovascular Disease

## 2012-11-26 ENCOUNTER — Encounter: Payer: Self-pay | Admitting: Cardiovascular Disease

## 2012-11-26 ENCOUNTER — Encounter: Payer: Self-pay | Admitting: Internal Medicine

## 2012-11-26 ENCOUNTER — Ambulatory Visit (INDEPENDENT_AMBULATORY_CARE_PROVIDER_SITE_OTHER): Payer: Medicare Other | Admitting: Internal Medicine

## 2012-11-26 VITALS — BP 104/82 | HR 66 | Temp 97.9°F | Resp 12 | Ht 65.0 in | Wt 161.8 lb

## 2012-11-26 VITALS — BP 116/80 | HR 77 | Ht 66.0 in | Wt 162.0 lb

## 2012-11-26 DIAGNOSIS — G609 Hereditary and idiopathic neuropathy, unspecified: Secondary | ICD-10-CM | POA: Insufficient documentation

## 2012-11-26 DIAGNOSIS — K219 Gastro-esophageal reflux disease without esophagitis: Secondary | ICD-10-CM

## 2012-11-26 DIAGNOSIS — M79609 Pain in unspecified limb: Secondary | ICD-10-CM

## 2012-11-26 DIAGNOSIS — M810 Age-related osteoporosis without current pathological fracture: Secondary | ICD-10-CM | POA: Insufficient documentation

## 2012-11-26 DIAGNOSIS — K5909 Other constipation: Secondary | ICD-10-CM

## 2012-11-26 DIAGNOSIS — E538 Deficiency of other specified B group vitamins: Secondary | ICD-10-CM

## 2012-11-26 DIAGNOSIS — K922 Gastrointestinal hemorrhage, unspecified: Secondary | ICD-10-CM

## 2012-11-26 DIAGNOSIS — E785 Hyperlipidemia, unspecified: Secondary | ICD-10-CM

## 2012-11-26 DIAGNOSIS — T39395A Adverse effect of other nonsteroidal anti-inflammatory drugs [NSAID], initial encounter: Secondary | ICD-10-CM

## 2012-11-26 DIAGNOSIS — E7800 Pure hypercholesterolemia, unspecified: Secondary | ICD-10-CM

## 2012-11-26 DIAGNOSIS — I2510 Atherosclerotic heart disease of native coronary artery without angina pectoris: Secondary | ICD-10-CM

## 2012-11-26 LAB — HEPATIC FUNCTION PANEL
ALT: 15 U/L (ref 0–53)
AST: 20 U/L (ref 0–37)
Alkaline Phosphatase: 53 U/L (ref 39–117)
Bilirubin, Direct: 0.1 mg/dL (ref 0.0–0.3)
Total Bilirubin: 0.6 mg/dL (ref 0.3–1.2)

## 2012-11-26 LAB — LIPID PANEL
HDL: 39 mg/dL — ABNORMAL LOW (ref >39.00)
Total CHOL/HDL Ratio: 4

## 2012-11-26 LAB — BASIC METABOLIC PANEL
CO2: 26 mEq/L (ref 19–32)
Calcium: 9.5 mg/dL (ref 8.4–10.5)
Creatinine, Ser: 1.3 mg/dL (ref 0.4–1.5)
GFR: 56.11 mL/min — ABNORMAL LOW (ref >60.00)
Glucose, Bld: 123 mg/dL — ABNORMAL HIGH (ref 70–99)
Sodium: 138 mEq/L (ref 135–145)

## 2012-11-26 MED ORDER — TRAMADOL HCL 50 MG PO TABS: 50.0000 mg | ORAL_TABLET | Freq: Three times a day (TID) | ORAL | Status: DC | PRN

## 2012-11-26 MED ORDER — OMEPRAZOLE 40 MG PO CPDR: 40.0000 mg | DELAYED_RELEASE_CAPSULE | Freq: Every day | ORAL | Status: DC

## 2012-11-26 NOTE — Patient Instructions (Addendum)
Your physician recommends that you return for a FASTING lipid profile: today and in 1 year   Your physician wants you to follow-up in: 1 year  You will receive a reminder letter in the mail two months in advance. If you don't receive a letter, please call our office to schedule the follow-up appointment.

## 2012-11-26 NOTE — Assessment & Plan Note (Signed)
Assessment: B12 deficiency could contribute to decreased energy but stress of hospitalization is more likely.  Pt was doing well with B12 pills but ran out and has since not been using the supplement. Plan: - restart B12 drops that you have at home

## 2012-11-26 NOTE — Patient Instructions (Addendum)
1. GI bleed - waiting for the lab results. You may be a little weak still. 2. Leg pain - need to wear the over the counter knee high stockings.           For pain - tramadol 50 mg three times a day. If this is not quite enough pain control we can increase the tramadol to 100 mg three times a day 3. Heart - sounds like a good report 4. Bone density - just at the level of osteoporosis at the hips. The fosamax was tearing up the esophagus and is stopped Plan  Take tums for calcium - you need about 1,000 mg a day of calcium. Take over the counter Vitamin D 1,000 iu daily 5. Constipation - milk of magnesia 1/2 capful two times a day until bowels are moving, then 1/2 capful every night or every other night.- 6. Stomach acid - will send in new Rx.

## 2012-11-26 NOTE — Telephone Encounter (Signed)
Pt seen in the office today. Per Dr Debby Bud Hydrocodone not prescribed.

## 2012-11-26 NOTE — Telephone Encounter (Signed)
Pt seen in the office today. Per Dr Hedda Slade not prescribed.

## 2012-11-26 NOTE — Assessment & Plan Note (Signed)
Assessment: Omeprazole is helping but their medication service prefers 90 day supplies to be written. Plan: - continue to use what was prescribed when you run out we will send in the 90 day supply

## 2012-11-26 NOTE — Assessment & Plan Note (Signed)
Assessment: constipation since using narcotics for leg pain last BM 3/22 Plan: - Milk of magnesia 1/2 cap full 3x's daily until first BM, after first BM can decrease to 1/2 cap full 2x's daily.

## 2012-11-26 NOTE — Assessment & Plan Note (Signed)
He has frequent calf pain.  He has good pulses.  Will continue to follow.

## 2012-11-26 NOTE — Progress Notes (Signed)
[name] Elliott Date of Birth  Aug 28, 1928 [facility] [address]    [address] [place], [place]  [address] [phone]  Fax  [phone]  History of Present Illness:  79 year old gentleman with a history of moderate diffuse coronary artery disease. We treated medically. The left anterior descending arteries/ 1st diagonal  stenosis has not was not suitable for PCI.  He complains of being sick in general is weak sensation. He also complains of some left arm pain.   When I saw him last month. He was having some episodes of chest pain. We were trying to decide whether or not these were due to angina. I asked him to take nitroglycerin when he had these episodes of pain. At times the nitroglycerin helps him and at other times the nitroglycerin did not do anything.  When he walks up a hill in his backyard he has significant shortness breath and this "sick feeling".   He has had a partial shoulder replacement for a shoulder fracture.  He has had chronic shoulder pain since that time.  He has chronic knee pain and is considering knee surgery. His office visit was for the purpose of preoperative evaluation.  Sept. 17, 2013- He has no cardiac complaints.  He denies any chest pain or dyspnea.  He still has some left shoulder stiffness from his surgery February 05, 2012. He has had some wheezing and was recently started on steroids and an antibiotic.  He also ws diagnosed with peripheral neurophy   He was prescribed gabapentin but his daughter did not fill the medication because it was too expensive.  She bought some over-the-counter vitamin B12 which seems to be helping a little bit.  November 26, 2012:  [name] was recently hospitalized last week for peptic ulcer  Disease.  He had vomiting of coffee-ground emesis. He also had blood in his stool.  The  Doctors discontinued his Marlin Canary powders and his Fosamax. He seems to be doing well from a cardiac standpoint.  No angina.  He is having trouble with  leg pain.    Current Outpatient Prescriptions on File Prior to Visit  Medication Sig Dispense Refill  . acetaminophen (TYLENOL) 500 MG tablet Take 1,000 mg by mouth every 6 (six) hours as needed for pain.      Marland Kitchen aspirin EC 81 MG tablet Take 81 mg by mouth daily.      . furosemide (LASIX) 40 MG tablet Take 40 mg by mouth 2 (two) times daily.      Marland Kitchen gabapentin (NEURONTIN) 300 MG capsule Take 300 mg by mouth 3 (three) times daily.      Marland Kitchen GLUCOSAMINE PO Take 1 tablet by mouth daily.      Marland Kitchen HYDROcodone-acetaminophen (NORCO/VICODIN) 5-325 MG per tablet Take 1 tablet by mouth every 6 (six) hours as needed for pain.  10 tablet  0  . isosorbide mononitrate (IMDUR) 60 MG 24 hr tablet Take 60 mg by mouth daily.      . nitroGLYCERIN (NITROSTAT) 0.4 MG SL tablet Place 0.4 mg under the tongue every 5 (five) minutes as needed for chest pain.      Marland Kitchen omeprazole (PRILOSEC) 20 MG capsule Take 2 capsules (40 mg total) by mouth 2 (two) times daily.  120 capsule  2  . potassium chloride SA (K-DUR,KLOR-CON) 20 MEQ tablet Take 20 mEq by mouth 2 (two) times daily.      . [DISCONTINUED] Hyoscyamine Sulfate (HYOMAX-DT) 0.375 MG TBCR Take 1 tab bid for 5 days, then  prn abd pain  30 each  1   No current facility-administered medications on file prior to visit.    Allergies  Allergen Reactions  . Crestor (Rosuvastatin Calcium)     Intolerance,  Back pain    Past Medical History  Diagnosis Date  . Constipation, chronic   . Sebaceous cyst   . Other malaise and fatigue   . Pain in joint, shoulder region     Has chronic shoulder pain  . Persistent disorder of initiating or maintaining sleep   . Pain in limb   . Thrombocytopenia, unspecified   . Dementia     with periods of amnesia  . Other B-complex deficiencies   . Hypothyroidism   . Pain in limb   . Esophageal reflux   . Unspecified essential hypertension   . Arthropathy, unspecified, site unspecified   . Pure hypercholesterolemia   . Pneumonia 2011  .  Orthostasis   . PVC's (premature ventricular contractions)   . S/P cardiac cath 08/08/11    mild to moderate CAD primarily in the LAD. None are obstructive and appear stable from prior cath in 2007; managed medically  . Arthritis   . CAD (coronary artery disease)     last cath in 2012. Managed medically-some blockages  . Headache   . 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 MRI Nov '11 - tendonosis and partial tear. Left shoulder surgery Jan '12 for partial shoulder replacement. [name])   . Shortness of breath     " once in awhile "  . Blood dyscrasia     thromboctopenia    Past Surgical History  Procedure Laterality Date  . Hand surgery      Right crush injury, right fifth digit contracture, limited  motion  . Lumbar spine surgery      2007, Dr Eloise Harman (Neurosurgeon)  . Knee surgery      age 53, x2  . Cataract extraction  2009  . Rotator cuff repair  8.30.2011  . US echocardiography  02-28-2010    Est EF 50-55%  . Cardiac catheterization      08/2011  . Shoulder hemi-arthroplasty    . Finger amputation      pinky finger right hand  . Hardware removal  02/05/2012    Procedure: HARDWARE REMOVAL;  Surgeon: Eulas Post, MD;  Location: [facility] OR;  Service: Orthopedics;  Laterality: Left;  . Reverse shoulder arthroplasty  02/05/2012    Procedure: REVERSE SHOULDER ARTHROPLASTY;  Surgeon: Eulas Post, MD;  Location: [facility] OR;  Service: Orthopedics;  Laterality: Left;  . Back surgery    . Esophagogastroduodenoscopy N/A 11/19/2012    Procedure: ESOPHAGOGASTRODUODENOSCOPY (EGD);  Surgeon: Beverley Fiedler, MD;  Location: [facility] ENDOSCOPY;  Service: Gastroenterology;  Laterality: N/A;    History  Smoking status  . Former Smoker  . Types: Cigars  . Quit date: 09/03/1978  Smokeless tobacco  . Current User  . Types: Chew    Comment: occasional cigar. tobacco info given 10/10/2011    History  Alcohol Use No    Family History  Problem Relation Age of Onset  .  Breast cancer Mother   . Cancer Brother     throat  . Diabetes Daughter   . Colon cancer Neg Hx   . Kidney disease Father     Reviw of Systems:  Reviewed in the HPI.  All other systems are negative.  Physical Exam: BP 116/80  Pulse 77  Ht 5\' 6"  (1.676  m)  Wt 162 lb (73.483 kg)  BMI 26.16 kg/m2  SpO2 97% The patient is alert and oriented x 3.  The mood and affect are normal.   Skin: warm and dry.  Color is normal.    HEENT:   His neck is supple. His carotids are 2+. There is no JVD  Lungs: His lungs are clear.   Heart: Regular rate, S1-S2.  Occasional premature beats   Abdomen: His abdomen is soft gait is good bowel sounds. There is no hepatosplenomegaly.  Extremities:  He has no clubbing cyanosis or edema  Neuro:  His gait is a bit unsteady. He tends to shuffle his feet. His motor and sensory are basically intact.     ECG: 11/23/2012: ( in the hospital)  normal sinus rhythm with premature atrial contractions. He has no ST or T wave changes. Assessment / Plan:

## 2012-11-26 NOTE — Assessment & Plan Note (Addendum)
Assessment: pt has good DP pulses in both legs.  Swelling, discoloration, and pain with palpation are consistent with venous insufficiency. Plan: - tramadol 50 mg 3x's daily - continue the same dose of furosemide  - compression stockings - when sitting keep feet elevated -avoid narcotics

## 2012-11-26 NOTE — Progress Notes (Signed)
Subjective:     Patient ID: [name] Elliott, male   DOB: [birth year], 79 y.o.   MRN: [id number]  HPI Mr. Reaser is an 79 yo man that presents for follow up after a hospital admission for GI bleeding March 18th.  Since discharge he has had no further issues with hematochezia, melena, or hematemesis.  Today he saw his cardiologist Dr. Elease Hashimoto they have drawn his blood for a hemoglobin/hematocrit.  He does report that his energy level is not back to baseline and his activity is limited due to severe pain in his legs.    The pain is localized to his calves and is a constant throbbing pain.  The pain is associated with swelling in the legs, left leg is worse than the right.  He has discussed this leg pain with Dr. Debby Bud in the past and has tried tylenol with no relief and was given a 10 pill supply of Vicodin by a Dr. Oren Section recently that did help however this has caused constipation.  He was told to get compression stockings which his daughter has not had time to pick up.  The pt also wanted to discuss other options for his decreased bone density.  Previously he was taking Fosamax but that medication was discontinued when he developed esophageal bleeding.   Past Medical History  Diagnosis Date  . Constipation, chronic   . Sebaceous cyst   . Other malaise and fatigue   . Pain in joint, shoulder region     Has chronic shoulder pain  . Persistent disorder of initiating or maintaining sleep   . Pain in limb   . Thrombocytopenia, unspecified   . Dementia     with periods of amnesia  . Other B-complex deficiencies   . Hypothyroidism   . Pain in limb   . Esophageal reflux   . Unspecified essential hypertension   . Arthropathy, unspecified, site unspecified   . Pure hypercholesterolemia   . Pneumonia 2011  . Orthostasis   . PVC's (premature ventricular contractions)   . S/P cardiac cath 08/08/11    mild to moderate CAD primarily in the LAD. None are obstructive and appear stable from prior cath  in 2007; managed medically  . Arthritis   . CAD (coronary artery disease)     last cath in 2012. Managed medically-some blockages  . Headache   . 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 MRI Nov '11 - tendonosis and partial tear. Left shoulder surgery Jan '12 for partial shoulder replacement. [name])   . Shortness of breath     " once in awhile "  . Blood dyscrasia     thromboctopenia   Past Surgical History  Procedure Laterality Date  . Hand surgery      Right crush injury, right fifth digit contracture, limited  motion  . Lumbar spine surgery      2007, Dr Eloise Harman (Neurosurgeon)  . Knee surgery      age 31, x2  . Cataract extraction  2009  . Rotator cuff repair  8.30.2011  . US echocardiography  02-28-2010    Est EF 50-55%  . Cardiac catheterization      08/2011  . Shoulder hemi-arthroplasty    . Finger amputation      pinky finger right hand  . Hardware removal  02/05/2012    Procedure: HARDWARE REMOVAL;  Surgeon: Eulas Post, MD;  Location: [facility] OR;  Service: Orthopedics;  Laterality: Left;  . Reverse shoulder arthroplasty  02/05/2012  Procedure: REVERSE SHOULDER ARTHROPLASTY;  Surgeon: Eulas Post, MD;  Location: [facility] OR;  Service: Orthopedics;  Laterality: Left;  . Back surgery    . Esophagogastroduodenoscopy N/A 11/19/2012    Procedure: ESOPHAGOGASTRODUODENOSCOPY (EGD);  Surgeon: Beverley Fiedler, MD;  Location: [facility] ENDOSCOPY;  Service: Gastroenterology;  Laterality: N/A;   Family History  Problem Relation Age of Onset  . Breast cancer Mother   . Cancer Brother     throat  . Diabetes Daughter   . Colon cancer Neg Hx   . Kidney disease Father    History   Social History  . Marital Status: Widowed    Spouse Name: N/A    Number of Children: 2  . Years of Education: N/A   Occupational History  . retired    Social History Main Topics  . Smoking status: Former Smoker    Types: Cigars    Quit date: 09/03/1978  . Smokeless tobacco:  Current User    Types: Chew     Comment: occasional cigar. tobacco info given 10/10/2011  . Alcohol Use: No  . Drug Use: No  . Sexually Active: Not on file   Other Topics Concern  . Not on file   Social History Narrative   2nd grade education   Married '58, '95   2 dtr '59, '64, youngest daughter died septic kidney   Lives alone, handles all adls      Regular Exercise -  NO      Daily caffine   Current Outpatient Prescriptions on File Prior to Visit  Medication Sig Dispense Refill  . acetaminophen (TYLENOL) 500 MG tablet Take 1,000 mg by mouth every 6 (six) hours as needed for pain.      Marland Kitchen aspirin EC 81 MG tablet Take 81 mg by mouth daily.      . furosemide (LASIX) 40 MG tablet Take 40 mg by mouth 2 (two) times daily.      Marland Kitchen gabapentin (NEURONTIN) 300 MG capsule Take 300 mg by mouth 3 (three) times daily.      Marland Kitchen GLUCOSAMINE PO Take 1 tablet by mouth daily.      Marland Kitchen HYDROcodone-acetaminophen (NORCO/VICODIN) 5-325 MG per tablet Take 1 tablet by mouth every 6 (six) hours as needed for pain.  10 tablet  0  . isosorbide mononitrate (IMDUR) 60 MG 24 hr tablet Take 60 mg by mouth daily.      . nitroGLYCERIN (NITROSTAT) 0.4 MG SL tablet Place 0.4 mg under the tongue every 5 (five) minutes as needed for chest pain.      . potassium chloride SA (K-DUR,KLOR-CON) 20 MEQ tablet Take 20 mEq by mouth 2 (two) times daily.      . [DISCONTINUED] Hyoscyamine Sulfate (HYOMAX-DT) 0.375 MG TBCR Take 1 tab bid for 5 days, then prn abd pain  30 each  1   No current facility-administered medications on file prior to visit.   Review of Systems  Constitutional: Positive for fatigue. Negative for fever and chills.  HENT: Negative for sore throat and trouble swallowing.   Respiratory: Negative for cough, chest tightness and shortness of breath.   Cardiovascular: Positive for leg swelling. Negative for chest pain.  Gastrointestinal: Positive for constipation. Negative for nausea, vomiting, blood in stool and  anal bleeding.  Genitourinary: Negative for difficulty urinating.  Musculoskeletal: Negative for back pain and arthralgias.  Skin: Negative for pallor.  Neurological: Negative for light-headedness.      Objective:   Physical Exam  Vitals reviewed. Constitutional:  Elderly kyphotic male resting in a chair in NAD  HENT:  Head: Normocephalic and atraumatic.  Eyes: Conjunctivae and EOM are normal. Pupils are equal, round, and reactive to light. No scleral icterus.  Cardiovascular: Normal rate, normal heart sounds and intact distal pulses.  Exam reveals no gallop and no friction rub.   No murmur heard. Irregular rhythm (heard skipped beats that coincided with skipped radial upstroke)  Pulmonary/Chest: Effort normal and breath sounds normal. He has no wheezes. He has no rales.  Abdominal: Soft. Bowel sounds are normal.  Musculoskeletal: He exhibits edema (2+ pitting edema to the knees bilaterally) and tenderness (legs are tender to palpation on shins and feet bilaterally).  Lymphadenopathy:    He has no cervical adenopathy.  Neurological: He is alert. No cranial nerve deficit.  Skin: Skin is warm and dry.  The skin of the feet are a deep purple with multiple prominent blue veins  Psychiatric: He has a normal mood and affect.      Assessment/Plan:  Patient seen and examined. Agree with assessment and plan per Mr. Lucretia Roers, MS III

## 2012-11-26 NOTE — Assessment & Plan Note (Signed)
Assessment: previously was taking Fosomax, this was discontinued after he developed esophageal damage. Plan: - Vitamin D 1000 IU daily - Tums for calcium supplementation

## 2012-11-26 NOTE — Assessment & Plan Note (Signed)
Will check fasting labs today.  

## 2012-11-26 NOTE — Assessment & Plan Note (Addendum)
Hospitalized march 18th for hematemesis and melena. Hgb dropped from approx 15 g to 12g and no transfusion was required. He had EGD which revealed esophageal ulcers, thought due to Alendronate, and gastric erosions. Since discharge he has had no further hematemesis or melena. He does endorse constipation, possibly due to narcotic pain medication. He c/o persistent generalized weakness.  Lab Results  Component Value Date   HGB 12.6* 11/21/2012   Stable since d/c.  Plan Continue PPI full strength but reduce to once a day, qAM  Stopped and will not restart bisphosphonate

## 2012-11-26 NOTE — Assessment & Plan Note (Signed)
[name] Elliott seems to be doing quite well. He's not having any episodes of angina.  We will continue his current meds.

## 2012-12-02 ENCOUNTER — Telehealth: Payer: Self-pay | Admitting: Internal Medicine

## 2012-12-02 NOTE — Telephone Encounter (Signed)
Patient Information:  Caller Name: [name]/ Daughter  Phone: [phone]  Patient: [name] Elliott, [name] Elliott  Gender: Male  DOB: 29-Nov-1927  Age: 79 Years  PCP: Illene Regulus (Adults only)  Office Follow Up:  Does the office need to follow up with this patient?: No  Instructions For The Office: N/A  RN Note:  Asked daughter would she like to make and an appt . Hesitant due to her work schedule and cannot make it for today. Reviewed emergent s/sx with her - Blurred vision, chest pain, confusion, numbness or weakness. Understaning expressed. She is unsure if she will go visit him today but talks to him every day.  She scheduled an appt for tomorrow at 4:30 p with Dr. Debby Bud  Symptoms  Reason For Call & Symptoms: Complains of headache since Friday 11/28/12.  The pain starts at back of neck radiating up the back of head to the top.  Daughter is frustrated with asking questions. She is at work , offered to talk/call the patient . "You may not get what you want". She is having difficult answering triage questions "As far as I know"...explained the importance of triage. Caller expressed understanding.  Reviewed Health History In EMR: Yes  Reviewed Medications In EMR: Yes  Reviewed Allergies In EMR: Yes  Reviewed Surgeries / Procedures: Yes  Date of Onset of Symptoms: 11/28/2012  Treatments Tried: tylenol and Tramadol  Treatments Tried Worked: No  Guideline(s) Used:  Headache  Disposition Per Guideline:   See Today or Tomorrow in Office  Reason For Disposition Reached:   Unexplained headache that is present > 24 hours  Advice Given:  Pain Medicines:  For pain relief, you can take either acetaminophen, ibuprofen, or naproxen.  They are over-the-counter (OTC) pain drugs. You can buy them at the drugstore.  Ibuprofen (e.g., Motrin, Advil):  Take 400 mg (two 200 mg pills) by mouth every 6 hours.  Rest:   Lie down in a dark, quiet place and try to relax. Close your eyes and imagine your entire  body relaxing.  Migraine Medication:   If your doctor has prescribed specific medication for your migraine, take it as directed as soon as the migraine starts.  Apply Cold to the Area:   Apply a cold wet washcloth or cold pack to the forehead for 20 minutes.  Call Back If:  Headache lasts longer than 24 hours  You become worse.  Patient Will Follow Care Advice:  YES  Appointment Scheduled:  12/03/2012 16:30:00 Appointment Scheduled Provider:  Illene Regulus (Adults only)

## 2012-12-03 ENCOUNTER — Ambulatory Visit (INDEPENDENT_AMBULATORY_CARE_PROVIDER_SITE_OTHER): Payer: Medicare Other | Admitting: Internal Medicine

## 2012-12-03 ENCOUNTER — Ambulatory Visit (HOSPITAL_COMMUNITY)
Admission: RE | Admit: 2012-12-03 | Discharge: 2012-12-03 | Disposition: A | Discharge status: Home / Self Care | Payer: Medicare Other | Source: Ambulatory Visit | Attending: Internal Medicine | Admitting: Internal Medicine

## 2012-12-03 VITALS — BP 110/70 | HR 85 | Temp 97.7°F | Resp 16 | Wt 159.2 lb

## 2012-12-03 DIAGNOSIS — R51 Headache: Secondary | ICD-10-CM

## 2012-12-03 DIAGNOSIS — Z8673 Personal history of transient ischemic attack (TIA), and cerebral infarction without residual deficits: Secondary | ICD-10-CM | POA: Insufficient documentation

## 2012-12-03 DIAGNOSIS — G319 Degenerative disease of nervous system, unspecified: Secondary | ICD-10-CM | POA: Insufficient documentation

## 2012-12-03 DIAGNOSIS — I672 Cerebral atherosclerosis: Secondary | ICD-10-CM | POA: Insufficient documentation

## 2012-12-03 DIAGNOSIS — I2510 Atherosclerotic heart disease of native coronary artery without angina pectoris: Secondary | ICD-10-CM | POA: Insufficient documentation

## 2012-12-03 MED ORDER — HYDROCODONE-ACETAMINOPHEN 5-325 MG PO TABS: 1.0000 | ORAL_TABLET | Freq: Four times a day (QID) | ORAL | Status: DC | PRN

## 2012-12-03 NOTE — Patient Instructions (Addendum)
Headache - posterior skull, the worst headache of your life. Your exam is normal but I am worried about the duration and severity of the headache. Most likely diagnosis is a tension headache.    Plan CT brain tonight  If normal will have your take Norco (hydrocodone with tylenol) every 6 hours as needed for headache  Be sure to take Milk of magnesia 2 or 3 times a day to prevent constipation.   Tension Headache A tension headache is a feeling of pain, pressure, or aching often felt over the front and sides of the head. The pain can be dull or can feel tight (constricting). It is the most common type of headache. Tension headaches are not normally associated with nausea or vomiting and do not get worse with physical activity. Tension headaches can last 30 minutes to several days.  CAUSES  The exact cause is not known, but it may be caused by chemicals and hormones in the brain that lead to pain. Tension headaches often begin after stress, anxiety, or depression. Other triggers may include:  Alcohol.  Caffeine (too much or withdrawal).  Respiratory infections (colds, flu, sinus infections).  Dental problems or teeth clenching.  Fatigue.  Holding your head and neck in one position too long while using a computer. SYMPTOMS   Pressure around the head.   Dull, aching head pain.   Pain felt over the front and sides of the head.   Tenderness in the muscles of the head, neck, and shoulders. DIAGNOSIS  A tension headache is often diagnosed based on:   Symptoms.   Physical examination.   A CT scan or MRI of your head. These tests may be ordered if symptoms are severe or unusual. TREATMENT  Medicines may be given to help relieve symptoms.  HOME CARE INSTRUCTIONS   Only take over-the-counter or prescription medicines for pain or discomfort as directed by your caregiver.   Lie down in a dark, quiet room when you have a headache.   Keep a journal to find out what may be  triggering your headaches. For example, write down:  What you eat and drink.  How much sleep you get.  Any change to your diet or medicines.  Try massage or other relaxation techniques.   Ice packs or heat applied to the head and neck can be used. Use these 3 to 4 times per day for 15 to 20 minutes each time, or as needed.   Limit stress.   Sit up straight, and do not tense your muscles.   Quit smoking if you smoke.  Limit alcohol use.  Decrease the amount of caffeine you drink, or stop drinking caffeine.  Eat and exercise regularly.  Get 7 to 9 hours of sleep, or as recommended by your caregiver.  Avoid excessive use of pain medicine as recurrent headaches can occur.  SEEK MEDICAL CARE IF:   You have problems with the medicines you were prescribed.  Your medicines do not work.  You have a change from the usual headache.  You have nausea or vomiting. SEEK IMMEDIATE MEDICAL CARE IF:   Your headache becomes severe.  You have a fever.  You have a stiff neck.  You have loss of vision.  You have muscular weakness or loss of muscle control.  You lose your balance or have trouble walking.  You feel faint or pass out.  You have severe symptoms that are different from your first symptoms. MAKE SURE YOU:   Understand these instructions.  Will watch your condition.  Will get help right away if you are not doing well or get worse. Document Released: 08/20/2005 Document Revised: 11/12/2011 Document Reviewed: 08/10/2011 [facility] Patient Information 2013 [place], [place].

## 2012-12-06 NOTE — Progress Notes (Signed)
Subjective:    Patient ID: [name] Elliott, male    DOB: 17-Mar-1928, 79 y.o.   MRN: [id number]  HPI [name] Elliott presents with a several day history of headache that he rates as severe. The pain is in the posterior cervical region with radiation up to the vertex scalp. He has pain every day but has been able to sleep. He has not had double vision, loss of vision, nausea or vomiting, increase in balance problems, difficulty with speech or managing his daily affairs. He has not had paresthesia or focal weakness. He denies any falls or injury. He has been taking his medicine as directed - daughter prepares his daily med tray.  PMH, FamHx and SocHx reviewed for any changes and relevance.  Current Outpatient Prescriptions on File Prior to Visit  Medication Sig Dispense Refill  . acetaminophen (TYLENOL) 500 MG tablet Take 1,000 mg by mouth every 6 (six) hours as needed for pain.      Marland Kitchen aspirin EC 81 MG tablet Take 81 mg by mouth daily.      . furosemide (LASIX) 40 MG tablet Take 40 mg by mouth 2 (two) times daily.      Marland Kitchen gabapentin (NEURONTIN) 300 MG capsule Take 300 mg by mouth 3 (three) times daily.      Marland Kitchen GLUCOSAMINE PO Take 1 tablet by mouth daily.      Marland Kitchen HYDROcodone-acetaminophen (NORCO/VICODIN) 5-325 MG per tablet Take 1 tablet by mouth every 6 (six) hours as needed for pain.  10 tablet  0  . isosorbide mononitrate (IMDUR) 60 MG 24 hr tablet Take 60 mg by mouth daily.      . nitroGLYCERIN (NITROSTAT) 0.4 MG SL tablet Place 0.4 mg under the tongue every 5 (five) minutes as needed for chest pain.      Marland Kitchen omeprazole (PRILOSEC) 40 MG capsule Take 1 capsule (40 mg total) by mouth daily.  90 capsule  3  . potassium chloride SA (K-DUR,KLOR-CON) 20 MEQ tablet Take 20 mEq by mouth 2 (two) times daily.      . traMADol (ULTRAM) 50 MG tablet Take 1 tablet (50 mg total) by mouth every 8 (eight) hours as needed for pain.  100 tablet  2  . [DISCONTINUED] Hyoscyamine Sulfate (HYOMAX-DT) 0.375 MG TBCR Take 1 tab  bid for 5 days, then prn abd pain  30 each  1   No current facility-administered medications on file prior to visit.      Review of Systems System review is negative for any constitutional, cardiac, pulmonary, GI or neuro symptoms or complaints other than as described in the HPI.     Objective:   Physical Exam Filed Vitals:   12/03/12 1646  BP: 110/70  Pulse: 85  Temp: 97.7 F (36.5 C)  Resp: 16   gen'l - WNWD stocky short elderly man in no acute distress HEENT- [place]/AT, no Battle's sign no raccoons eyes Neck - discomfort with extension, ok with flexion, stiffness/decrease ROM with rotation. No point tenderness noted Cor 2+ radial pulse Pulm normal respirations Neuro - A&O x 3; speech clear; cognition normal. CN II-XII - normal facial symmetry, PERRLA, EOMI, normal shoulder shrug (limited a little by right shoulder pain). MS - 5/5 throughout. DTR's 2+ radial and patellar tendons. Cerebellar - no tremor, negative Rhomberg, normal gait.   CT Brain w/o contrast: IMPRESSION:  Atrophy with small vessel chronic ischemic changes of deep cerebral  white matter.  Old lacunar infarcts left external capsule.  No acute intracranial abnormalities.  Assessment & Plan:  Headache - posterior skull, the worst headache of your life. Your exam is normal but I am worried about the duration and severity of the headache. Most likely diagnosis is a tension headache.    Plan CT brain tonight  If normal will have your take Norco (hydrocodone with tylenol) every 6 hours as needed for headache  Be sure to take Milk of magnesia 2 or 3 times a day to prevent constipation.

## 2012-12-08 ENCOUNTER — Telehealth: Payer: Self-pay | Admitting: Internal Medicine

## 2012-12-08 NOTE — Telephone Encounter (Signed)
Patient Information:  Caller Name: Agustin Cree  Phone: [phone]  Patient: [name] Elliott, [name] Elliott  Gender: Male  DOB: 07/13/1928  Age: 79 Years  PCP: Illene Regulus (Adults only)  Office Follow Up:  Does the office need to follow up with this patient?: Yes  Instructions For The Office: Please follow up with daughter regarding father's weakness and rectal bleed as needed.  RN Note:  Was in to be seen on 12/03/12 for headache.  Had a bleeding ulcers the week before and was in hospital for two days.  Patient is weak and daughter feels like he is getting weaker.  Had an episode of rectal bleeding on 12/06/12 with none since per patient-daughter did not witness.  Triage noted a disposition to be seen within 3 days but daughter declined appointment.  Wants Dr. Debby Bud made aware of the weakness-patient had to make himself eat and get up do his routine as well as the rectal bleeding episode on 12/06/12.  Symptoms  Reason For Call & Symptoms: Was in to be seen on 12/03/12 for headache.  Had a bleeding ulcers the week before and was in hospital.  Patient is weak and daughter feels like he is getting weaker.  Had an episode of rectal bleeding on 12/06/12 with none since.  Reviewed Health History In EMR: Yes  Reviewed Medications In EMR: Yes  Reviewed Allergies In EMR: Yes  Reviewed Surgeries / Procedures: Yes  Date of Onset of Symptoms: 11/28/2012  Guideline(s) Used:  Weakness (Generalized) and Fatigue  Disposition Per Guideline:   See Within 3 Days in Office  Reason For Disposition Reached:   Fatigue (i.e., tires easily, decreased energy) and persists > 1 week  Advice Given:  Call Back If:  You become worse.  Patient Refused Recommendation:  Patient Refused Care Advice  Wants Dr. Debby Bud to know of the generalized weakness and rectal bleed.

## 2012-12-08 NOTE — Telephone Encounter (Signed)
Please call daughter of [name] Elliott - it will be helpful to get a blood count since he is weaker and had another episode of bleeding. An order is on file for an H/H - he can come to the lab any time between 7:30 and 5:30.

## 2012-12-09 ENCOUNTER — Other Ambulatory Visit (INDEPENDENT_AMBULATORY_CARE_PROVIDER_SITE_OTHER): Payer: Medicare Other

## 2012-12-09 DIAGNOSIS — K922 Gastrointestinal hemorrhage, unspecified: Secondary | ICD-10-CM

## 2012-12-09 LAB — HEMOGLOBIN AND HEMATOCRIT, BLOOD: Hemoglobin: 14.4 g/dL (ref 13.0–17.0)

## 2012-12-09 NOTE — Telephone Encounter (Signed)
Phone call to [name] to let her know pt can come in anytime from now til 5:30 to have blood drawn. She expressed understanding and did not present any questions/concerns.

## 2012-12-11 ENCOUNTER — Telehealth: Payer: Self-pay | Admitting: Internal Medicine

## 2012-12-11 NOTE — Telephone Encounter (Signed)
Hgb 14.4 on 4/8 - normal range. Last visit we also addressed headache - normal CT scan.  For continued weakness and failure to thrive recommend ov evaluation - referral to one of my office colleagues (no appts available today).

## 2012-12-11 NOTE — Telephone Encounter (Signed)
Patient Information:  Caller Name: Agustin Cree  Phone: [phone]  Patient: Jonmichael, Beadnell  Gender: Male  DOB: 12/20/1927  Age: 79 Years  PCP: Illene Regulus (Adults only)  Office Follow Up:  Does the office need to follow up with this patient?: Yes  Instructions For The Office: Declines appt; wants to know next steps as weakness/fatigue is worse, not improving krs/can  RN Note:  Patient came in to office 12/09/12 for blood work for generalized fatigue; states weakness and fatigue are worse, not better.  Declines new triage; refuses to discuss with RN.  Declines appt, as "he does not believe any other testing or work can be done for him in the office."  Cannot perform ADL due to lack of energy.  Wants to know what Dr. Debby Bud would like to do next.  Info to office for provider review/callback; may reach family at [phone].  krs/can  Symptoms  Reason For Call & Symptoms: Seen in office 12/09/12 for blood work  Reviewed Health History In EMR: Yes  Reviewed Medications In EMR: Yes  Reviewed Allergies In EMR: Yes  Reviewed Surgeries / Procedures: Yes  Date of Onset of Symptoms: Unknown  Guideline(s) Used:  No Protocol Available - Sick Adult  Disposition Per Guideline:   Callback by PCP Today  Reason For Disposition Reached:   Nursing judgment  Advice Given:  N/A  Patient Will Follow Care Advice:  YES

## 2012-12-11 NOTE — Telephone Encounter (Signed)
Pt's daughter advised and states she will discuss with pt but he is likely to refuse to see any other provider. She will call back for appt if pt is agreeable.

## 2012-12-21 DIAGNOSIS — M810 Age-related osteoporosis without current pathological fracture: Secondary | ICD-10-CM

## 2012-12-21 DIAGNOSIS — E538 Deficiency of other specified B group vitamins: Secondary | ICD-10-CM

## 2012-12-21 DIAGNOSIS — K5909 Other constipation: Secondary | ICD-10-CM

## 2012-12-21 DIAGNOSIS — K922 Gastrointestinal hemorrhage, unspecified: Secondary | ICD-10-CM

## 2012-12-21 DIAGNOSIS — I10 Essential (primary) hypertension: Secondary | ICD-10-CM

## 2012-12-21 DIAGNOSIS — K219 Gastro-esophageal reflux disease without esophagitis: Secondary | ICD-10-CM

## 2012-12-21 DIAGNOSIS — M79609 Pain in unspecified limb: Secondary | ICD-10-CM

## 2013-02-06 ENCOUNTER — Encounter: Payer: Self-pay | Admitting: Internal Medicine

## 2013-02-06 ENCOUNTER — Other Ambulatory Visit (INDEPENDENT_AMBULATORY_CARE_PROVIDER_SITE_OTHER): Payer: Medicare Other

## 2013-02-06 ENCOUNTER — Ambulatory Visit (INDEPENDENT_AMBULATORY_CARE_PROVIDER_SITE_OTHER): Payer: Medicare Other | Admitting: Internal Medicine

## 2013-02-06 VITALS — BP 114/88 | HR 93 | Temp 97.4°F | Wt 163.0 lb

## 2013-02-06 DIAGNOSIS — M545 Low back pain, unspecified: Secondary | ICD-10-CM

## 2013-02-06 DIAGNOSIS — K5909 Other constipation: Secondary | ICD-10-CM

## 2013-02-06 DIAGNOSIS — I10 Essential (primary) hypertension: Secondary | ICD-10-CM

## 2013-02-06 LAB — URINALYSIS, ROUTINE W REFLEX MICROSCOPIC
Hgb urine dipstick: NEGATIVE
Leukocytes, UA: NEGATIVE
Urine Glucose: NEGATIVE
Urobilinogen, UA: 0.2 (ref 0.0–1.0)

## 2013-02-06 MED ORDER — PREDNISONE 10 MG PO TABS: ORAL_TABLET | ORAL | Status: DC

## 2013-02-06 MED ORDER — OXYCODONE HCL 5 MG PO TABA: 1.0000 | ORAL_TABLET | Freq: Four times a day (QID) | ORAL | Status: DC | PRN

## 2013-02-06 MED ORDER — TIZANIDINE HCL 4 MG PO TABS: 4.0000 mg | ORAL_TABLET | Freq: Four times a day (QID) | ORAL | Status: DC | PRN

## 2013-02-06 NOTE — Progress Notes (Signed)
Subjective:    Patient ID: [name] Elliott, male    DOB: March 11, 1928, 79 y.o.   MRN: [id number]  HPI  Pt of Dr Debby Bud, here with 1 mo gradually worsening mid and right lbp, initially milder to mod, worse with standing up, but now sharp and burning, mod to severe, more constant, radiates to right knee level and fortunately not assoc with RLE more distal pain/weakness/numbness though does quite a bit of difficultly walking now due to pain, worse to walk more than 50 ft.  Sitting and lying down can help but still at least mild persistent.  No fever, trauma, has hx of kyphosis, osteoporosis, GI bleed due to nsaids, states tramadol and hydrocodone not helping.  Pt denies chest pain, increased sob or doe, wheezing, orthopnea, PND, increased LE swelling, palpitations, dizziness or syncope.  Pt denies new neurological symptoms such as new headache, or facial or extremity weakness or numbness, except for the above. Denies urinary symptoms such as dysuria, frequency, urgency, flank pain, hematuria or n/v, fever, chills. Past Medical History  Diagnosis Date  . Constipation, chronic   . Sebaceous cyst   . Other malaise and fatigue   . Pain in joint, shoulder region     Has chronic shoulder pain  . Persistent disorder of initiating or maintaining sleep   . Pain in limb   . Thrombocytopenia, unspecified   . Dementia     with periods of amnesia  . Other B-complex deficiencies   . Hypothyroidism   . Pain in limb   . Esophageal reflux   . Unspecified essential hypertension   . Arthropathy, unspecified, site unspecified   . Pure hypercholesterolemia   . Pneumonia 2011  . Orthostasis   . PVC's (premature ventricular contractions)   . S/P cardiac cath 08/08/11    mild to moderate CAD primarily in the LAD. None are obstructive and appear stable from prior cath in 2007; managed medically  . Arthritis   . CAD (coronary artery disease)     last cath in 2012. Managed medically-some blockages  . Headache(784.0)    . 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 MRI Nov '11 - tendonosis and partial tear. Left shoulder surgery Jan '12 for partial shoulder replacement. [name])   . Shortness of breath     " once in awhile "  . Blood dyscrasia     thromboctopenia   Past Surgical History  Procedure Laterality Date  . Hand surgery      Right crush injury, right fifth digit contracture, limited  motion  . Lumbar spine surgery      2007, Dr Eloise Harman (Neurosurgeon)  . Knee surgery      age 24, x2  . Cataract extraction  2009  . Rotator cuff repair  8.30.2011  . US echocardiography  02-28-2010    Est EF 50-55%  . Cardiac catheterization      08/2011  . Shoulder hemi-arthroplasty    . Finger amputation      pinky finger right hand  . Hardware removal  02/05/2012    Procedure: HARDWARE REMOVAL;  Surgeon: Eulas Post, MD;  Location: [facility] OR;  Service: Orthopedics;  Laterality: Left;  . Reverse shoulder arthroplasty  02/05/2012    Procedure: REVERSE SHOULDER ARTHROPLASTY;  Surgeon: Eulas Post, MD;  Location: [facility] OR;  Service: Orthopedics;  Laterality: Left;  . Back surgery    . Esophagogastroduodenoscopy N/A 11/19/2012    Procedure: ESOPHAGOGASTRODUODENOSCOPY (EGD);  Surgeon: Beverley Fiedler, MD;  Location: [facility] ENDOSCOPY;  Service: Gastroenterology;  Laterality: N/A;    reports that he quit smoking about 34 years ago. His smoking use included Cigars. His smokeless tobacco use includes Chew. He reports that he does not drink alcohol or use illicit drugs. family history includes Breast cancer in his mother; Cancer in his brother; Diabetes in his daughter; and Kidney disease in his father.  There is no history of Colon cancer. Allergies  Allergen Reactions  . Crestor (Rosuvastatin Calcium)     Intolerance,  Back pain   Current Outpatient Prescriptions on File Prior to Visit  Medication Sig Dispense Refill  . acetaminophen (TYLENOL) 500 MG tablet Take 1,000 mg by mouth every 6 (six)  hours as needed for pain.      Marland Kitchen aspirin EC 81 MG tablet Take 81 mg by mouth daily.      . furosemide (LASIX) 40 MG tablet Take 40 mg by mouth 2 (two) times daily.      Marland Kitchen gabapentin (NEURONTIN) 300 MG capsule Take 300 mg by mouth 3 (three) times daily.      Marland Kitchen GLUCOSAMINE PO Take 1 tablet by mouth daily.      Marland Kitchen HYDROcodone-acetaminophen (NORCO) 5-325 MG per tablet Take 1 tablet by mouth every 6 (six) hours as needed for pain.  30 tablet  1  . isosorbide mononitrate (IMDUR) 60 MG 24 hr tablet Take 60 mg by mouth daily.      . nitroGLYCERIN (NITROSTAT) 0.4 MG SL tablet Place 0.4 mg under the tongue every 5 (five) minutes as needed for chest pain.      Marland Kitchen omeprazole (PRILOSEC) 40 MG capsule Take 1 capsule (40 mg total) by mouth daily.  90 capsule  3  . potassium chloride SA (K-DUR,KLOR-CON) 20 MEQ tablet Take 20 mEq by mouth 2 (two) times daily.      . traMADol (ULTRAM) 50 MG tablet Take 1 tablet (50 mg total) by mouth every 8 (eight) hours as needed for pain.  100 tablet  2  . [DISCONTINUED] Hyoscyamine Sulfate (HYOMAX-DT) 0.375 MG TBCR Take 1 tab bid for 5 days, then prn abd pain  30 each  1   No current facility-administered medications on file prior to visit.   Review of Systems  Constitutional: Negative for unexpected weight change, or unusual diaphoresis  HENT: Negative for tinnitus.   Eyes: Negative for photophobia and visual disturbance.  Respiratory: Negative for choking and stridor.   Gastrointestinal: Negative for vomiting and blood in stool.  Genitourinary: Negative for hematuria and decreased urine volume.  Musculoskeletal: Negative for acute joint swelling Skin: Negative for color change and wound.  Neurological: Negative for tremors and numbness other than noted  Psychiatric/Behavioral: Negative for decreased concentration or  hyperactivity.       Objective:   Physical Exam BP 114/88  Pulse 93  Temp(Src) 97.4 F (36.3 C) (Oral)  Wt 163 lb (73.936 kg)  BMI 27.12 kg/m2   SpO2 94% VS noted, not ill but uncomfortable sitting on exam table Constitutional: Pt appears well-developed and well-nourished.  HENT: Head: NCAT.  Right Ear: External ear normal.  Left Ear: External ear normal.  Eyes: Conjunctivae and EOM are normal. Pupils are equal, round, and reactive to light.  Neck: Normal range of motion. Neck supple.  Cardiovascular: Normal rate and regular rhythm.   Pulmonary/Chest: Effort normal and breath sounds normal.  Abd:  Soft, NT, non-distended, + BS Spine: nontender Does have tender over upper right buttock area but no skin change/swelling,  no tender over right lateral greater trochanter or more distal leg Neurological: Pt is alert. Not confused , motor 5/5 except ? Trace RLE distal weakness but could be limited due to pain, sens/dtr intact Skin: Skin is warm. No erythema. No rash Psychiatric: Pt behavior is normal. Thought content normal.     Assessment & Plan:

## 2013-02-06 NOTE — Patient Instructions (Addendum)
Please take all new medication as prescribed - the new pain medication (oxycodone), muscle relaxer, and prednisone Please continue all other medications as before, except do not take the tramadol or hydrocodone for now Please have the pharmacy call with any other refills you may need. You will be contacted regarding the referral for: MRI for the lower back Depending on the results of the treatment and the MRI, you may need to see orthopedic  Please go to the LAB in the Basement (turn left off the elevator) for the tests to be done today - just the urine test today  Please remember to sign up for My Chart if you have not done so, as this will be important to you in the future with finding out test results, communicating by private email, and scheduling acute appointments online when needed.

## 2013-02-08 NOTE — Assessment & Plan Note (Signed)
stable overall by history and exam, recent data reviewed with pt, and pt to continue medical treatment as before,  to f/u any worsening symptoms or concerns BP Readings from Last 3 Encounters:  02/06/13 114/88  12/03/12 110/70  11/26/12 104/82

## 2013-02-08 NOTE — Assessment & Plan Note (Signed)
Unclear etiology and unclear neuro defecit/change, but has severe prolonged pain > 1 mo, cant r/o underlying flare of DJD/DDD, radiculitis or radiculopathy, facet syndrome - for MRI, oxycodone prn,  to f/u any worsening symptoms or concerns

## 2013-02-08 NOTE — Assessment & Plan Note (Signed)
Advised colace bid with taking narcotic, f/u prn

## 2013-02-16 ENCOUNTER — Inpatient Hospital Stay: Admission: RE | Admit: 2013-02-16 | Payer: Medicare Other | Source: Ambulatory Visit

## 2013-02-16 ENCOUNTER — Ambulatory Visit (INDEPENDENT_AMBULATORY_CARE_PROVIDER_SITE_OTHER): Payer: Medicare Other | Admitting: Internal Medicine

## 2013-02-16 ENCOUNTER — Telehealth: Payer: Self-pay | Admitting: Internal Medicine

## 2013-02-16 ENCOUNTER — Encounter: Payer: Self-pay | Admitting: Internal Medicine

## 2013-02-16 VITALS — BP 92/62 | HR 78 | Temp 98.3°F | Resp 16 | Wt 159.8 lb

## 2013-02-16 DIAGNOSIS — M129 Arthropathy, unspecified: Secondary | ICD-10-CM

## 2013-02-16 DIAGNOSIS — M25561 Pain in right knee: Secondary | ICD-10-CM

## 2013-02-16 DIAGNOSIS — M25569 Pain in unspecified knee: Secondary | ICD-10-CM

## 2013-02-16 MED ORDER — METHYLPREDNISOLONE ACETATE 80 MG/ML IJ SUSP
80.0000 mg | Freq: Once | INTRAMUSCULAR | Status: AC
  Administered 2013-02-16: 80 mg via INTRAMUSCULAR

## 2013-02-16 NOTE — Progress Notes (Signed)
  Subjective:    Patient ID: [name] Elliott, male    DOB: 1927-10-01, 79 y.o.   MRN: [id number]  HPI Patient seen by Dr. Jonny Ruiz for leg and back pain. He was for MRI this evening but last night had too much pain all night and today - MRI was cancelled. He presents to the office c/o that he can walk or bear weight and seems to indicate pain at the medial aspect of the distal leg just below the knee. He doesn't c/o of back pain that is not related to knee movement. He has advance OA at other joints.     Review of Systems     Objective:   Physical Exam Filed Vitals:   02/16/13 1623  BP: 92/62  Pulse: 78  Temp: 98.3 F (36.8 C)  Resp: 16   Wt Readings from Last 3 Encounters:  02/16/13 159 lb 12.8 oz (72.485 kg)  02/06/13 163 lb (73.936 kg)  12/03/12 159 lb 4 oz (72.235 kg)   Gen'l- elderly man in acute pain of the right leg Cor- RRR Pulm - CTAP MSK  - Back exam: abnormal stand secondary to right leg pain;  flex to greater than 100 degrees;abnormal gait due to right leg pain; normal toe/heel walk; normal step up to exam table using left leg; normal SLR sitting; normal DTRs at the patellar tendons; normal sensation to light touch, pin-prick and deep vibratory stimulus; no  CVA tenderness; able to move supine to sitting witout assistance. Right knee painful with passive ROM, very tender along medial and lateral joint line, extended tenderness to infra-patellar region. No mass in the leg.  Procedure Joint/bursal injection right knee  Indication - localized pain Consent - informed verbal consent from patient after explanation of risks of bleeding and infection Prep - injection site identified, prepped with betadine followed by alcohol. Med -   80   Mg depomedrol with 1 Cc 2% xylocain w/ epi Injection - bursa/joint space entered easily. Injected without difficulty. Patient tolerated this well. Post-procedure - patient with rapid reduction in discomfort. Bandaid applied. Routine precautions  provided including instruction to return for fever, drainage or increased pain         Assessment & Plan:

## 2013-02-16 NOTE — Telephone Encounter (Signed)
Caller Name: Agustin Cree  Phone: [phone]  Patient: [name] Elliott, [name] Elliott  Gender: Male  DOB: February 14, 1928  Age: 79 Years  PCP: Illene Regulus (Adults only)   Does the office need to follow up with this patient?: No  RN Note:  dragging leg, states was told to have seen in office rather than having seen in EDinsists on appt in office,   Reason For Call & Symptoms: emergent call, unable to complete MRI scheduled for 7:00 02/16/13, "he can't lie on the table, due to dragging right leg,  Reviewed Health History In EMR: Yes  Reviewed Medications In EMR: Yes  Reviewed Allergies In EMR: Yes  Reviewed Surgeries / Procedures: Yes  Date of Onset of Symptoms: 02/16/2013  Guideline(s) Used:  Back Pain  Disposition Per Guideline:  Go to ED Now (or to Office with PCP Approval)  Reason For Disposition Reached:  Weakness of a leg or foot (e.g., unable to bear weight, dragging foot)  Advice Given:  Call Back If: You become worse.  Patient Will Follow Care Advice:  YES  Appointment Scheduled:  02/16/2013 16:00:00; Scheduled Provider:  Illene Regulus (Adults only)

## 2013-02-16 NOTE — Patient Instructions (Addendum)
Pain seems to be predominantly of the right knee with radiation to the distal leg. There is pain with movement of the knee and with pressure against the joint. After steroid injection the leg and knee feels better and you could bear weight better. There does not appear to be significant back pain on exam and this does not look like sciatica.  Plan  the steroid shot should last for several weeks  For pain it kis ok to try taking Aleve twice a day but watch carefully for any belly pain or irritation.  You will need to have knee xrays and may need to see Dr. Dion Saucier if there is advanced damage to the knee.

## 2013-02-17 ENCOUNTER — Ambulatory Visit (INDEPENDENT_AMBULATORY_CARE_PROVIDER_SITE_OTHER): Payer: Medicare Other | Admitting: Internal Medicine

## 2013-02-17 ENCOUNTER — Inpatient Hospital Stay (HOSPITAL_COMMUNITY): Payer: Medicare Other

## 2013-02-17 ENCOUNTER — Encounter (HOSPITAL_COMMUNITY): Payer: Self-pay | Admitting: Internal Medicine

## 2013-02-17 ENCOUNTER — Encounter: Payer: Self-pay | Admitting: Internal Medicine

## 2013-02-17 ENCOUNTER — Observation Stay (HOSPITAL_COMMUNITY)
Admission: AD | Admit: 2013-02-17 | Discharge: 2013-02-19 | Disposition: A | Discharge status: Home / Self Care | Payer: Medicare Other | Source: Ambulatory Visit | Attending: Internal Medicine | Admitting: Internal Medicine

## 2013-02-17 VITALS — BP 120/70 | HR 110 | Temp 99.6°F | Resp 12

## 2013-02-17 DIAGNOSIS — M5410 Radiculopathy, site unspecified: Secondary | ICD-10-CM

## 2013-02-17 DIAGNOSIS — F0390 Unspecified dementia without behavioral disturbance: Secondary | ICD-10-CM | POA: Insufficient documentation

## 2013-02-17 DIAGNOSIS — I2510 Atherosclerotic heart disease of native coronary artery without angina pectoris: Secondary | ICD-10-CM

## 2013-02-17 DIAGNOSIS — Z79899 Other long term (current) drug therapy: Secondary | ICD-10-CM | POA: Insufficient documentation

## 2013-02-17 DIAGNOSIS — M545 Low back pain: Secondary | ICD-10-CM

## 2013-02-17 DIAGNOSIS — M25569 Pain in unspecified knee: Secondary | ICD-10-CM | POA: Insufficient documentation

## 2013-02-17 DIAGNOSIS — I10 Essential (primary) hypertension: Secondary | ICD-10-CM

## 2013-02-17 DIAGNOSIS — IMO0002 Reserved for concepts with insufficient information to code with codable children: Secondary | ICD-10-CM

## 2013-02-17 DIAGNOSIS — M79609 Pain in unspecified limb: Principal | ICD-10-CM | POA: Insufficient documentation

## 2013-02-17 DIAGNOSIS — R29898 Other symptoms and signs involving the musculoskeletal system: Secondary | ICD-10-CM | POA: Insufficient documentation

## 2013-02-17 DIAGNOSIS — M47817 Spondylosis without myelopathy or radiculopathy, lumbosacral region: Secondary | ICD-10-CM | POA: Insufficient documentation

## 2013-02-17 HISTORY — DX: Low back pain: M54.5

## 2013-02-17 HISTORY — DX: Other chronic pain: G89.29

## 2013-02-17 HISTORY — DX: Low back pain, unspecified: M54.50

## 2013-02-17 HISTORY — DX: Personal history of other diseases of the digestive system: Z87.19

## 2013-02-17 LAB — CBC WITH DIFFERENTIAL/PLATELET
Basophils Relative: 0 % (ref 0–1)
Eosinophils Absolute: 0 10*3/uL (ref 0.0–0.7)
Eosinophils Relative: 0 % (ref 0–5)
Lymphs Abs: 1.7 10*3/uL (ref 0.7–4.0)
Monocytes Absolute: 1.3 10*3/uL — ABNORMAL HIGH (ref 0.1–1.0)
Monocytes Relative: 12 % (ref 3–12)
Neutrophils Relative %: 72 % (ref 43–77)
WBC: 10.4 10*3/uL (ref 4.0–10.5)

## 2013-02-17 LAB — COMPREHENSIVE METABOLIC PANEL
Alkaline Phosphatase: 46 U/L (ref 39–117)
BUN: 26 mg/dL — ABNORMAL HIGH (ref 6–23)
Chloride: 95 mEq/L — ABNORMAL LOW (ref 96–112)
GFR calc Af Amer: 47 mL/min — ABNORMAL LOW (ref >90)
GFR calc non Af Amer: 41 mL/min — ABNORMAL LOW (ref >90)
Glucose, Bld: 159 mg/dL — ABNORMAL HIGH (ref 70–99)
Potassium: 4.2 mEq/L (ref 3.5–5.1)
Total Bilirubin: 1.1 mg/dL (ref 0.3–1.2)

## 2013-02-17 MED ORDER — METHYLPREDNISOLONE SODIUM SUCC 40 MG IJ SOLR
40.0000 mg | Freq: Four times a day (QID) | INTRAMUSCULAR | Status: DC
  Administered 2013-02-17 – 2013-02-19 (×6): 40 mg via INTRAVENOUS
  Filled 2013-02-17 (×11): qty 1

## 2013-02-17 MED ORDER — SODIUM CHLORIDE 0.45 % IV SOLN
50.0000 mL/h | INTRAVENOUS | Status: DC
  Administered 2013-02-17: 50 mL/h via INTRAVENOUS

## 2013-02-17 MED ORDER — ENOXAPARIN SODIUM 40 MG/0.4ML ~~LOC~~ SOLN
40.0000 mg | SUBCUTANEOUS | Status: DC
  Administered 2013-02-17 – 2013-02-18 (×2): 40 mg via SUBCUTANEOUS
  Filled 2013-02-17 (×3): qty 0.4

## 2013-02-17 MED ORDER — POTASSIUM CHLORIDE CRYS ER 20 MEQ PO TBCR
20.0000 meq | EXTENDED_RELEASE_TABLET | Freq: Two times a day (BID) | ORAL | Status: DC
  Administered 2013-02-17 – 2013-02-19 (×4): 20 meq via ORAL
  Filled 2013-02-17 (×5): qty 1

## 2013-02-17 MED ORDER — FUROSEMIDE 40 MG PO TABS
40.0000 mg | ORAL_TABLET | Freq: Two times a day (BID) | ORAL | Status: DC
  Administered 2013-02-17 – 2013-02-19 (×4): 40 mg via ORAL
  Filled 2013-02-17 (×6): qty 1

## 2013-02-17 MED ORDER — SENNA 8.6 MG PO TABS
1.0000 | ORAL_TABLET | Freq: Two times a day (BID) | ORAL | Status: DC
  Administered 2013-02-17 – 2013-02-19 (×4): 8.6 mg via ORAL
  Filled 2013-02-17 (×5): qty 1

## 2013-02-17 MED ORDER — GABAPENTIN 300 MG PO CAPS
300.0000 mg | ORAL_CAPSULE | Freq: Three times a day (TID) | ORAL | Status: DC
  Administered 2013-02-17 – 2013-02-19 (×6): 300 mg via ORAL
  Filled 2013-02-17 (×8): qty 1

## 2013-02-17 MED ORDER — ZOLPIDEM TARTRATE 5 MG PO TABS: 5.0000 mg | ORAL_TABLET | Freq: Every day | ORAL | Status: DC

## 2013-02-17 MED ORDER — ZOLPIDEM TARTRATE 5 MG PO TABS: 5.0000 mg | ORAL_TABLET | Freq: Every evening | ORAL | Status: DC | PRN

## 2013-02-17 MED ORDER — OXYCODONE HCL 5 MG PO TABA: 1.0000 | ORAL_TABLET | Freq: Four times a day (QID) | ORAL | Status: DC | PRN

## 2013-02-17 MED ORDER — OXYCODONE HCL 5 MG PO TABS
5.0000 mg | ORAL_TABLET | Freq: Four times a day (QID) | ORAL | Status: DC | PRN
  Administered 2013-02-17 – 2013-02-19 (×2): 5 mg via ORAL
  Filled 2013-02-17 (×2): qty 1

## 2013-02-17 MED ORDER — ISOSORBIDE MONONITRATE ER 60 MG PO TB24
60.0000 mg | ORAL_TABLET | Freq: Every day | ORAL | Status: DC
Start: 2013-02-17 — End: 2013-02-19
  Administered 2013-02-18 – 2013-02-19 (×2): 60 mg via ORAL
  Filled 2013-02-17 (×3): qty 1

## 2013-02-17 MED ORDER — PANTOPRAZOLE SODIUM 40 MG PO TBEC
40.0000 mg | DELAYED_RELEASE_TABLET | Freq: Every day | ORAL | Status: DC
  Administered 2013-02-18 – 2013-02-19 (×2): 40 mg via ORAL
  Filled 2013-02-17 (×2): qty 1

## 2013-02-17 MED ORDER — TRAMADOL HCL 50 MG PO TABS
50.0000 mg | ORAL_TABLET | Freq: Four times a day (QID) | ORAL | Status: DC | PRN
  Filled 2013-02-17: qty 1

## 2013-02-17 MED ORDER — TIZANIDINE HCL 4 MG PO TABS
4.0000 mg | ORAL_TABLET | Freq: Four times a day (QID) | ORAL | Status: DC | PRN
  Filled 2013-02-17: qty 1

## 2013-02-17 MED ORDER — ASPIRIN EC 81 MG PO TBEC
81.0000 mg | DELAYED_RELEASE_TABLET | Freq: Every day | ORAL | Status: DC
Start: 2013-02-17 — End: 2013-02-19
  Administered 2013-02-18 – 2013-02-19 (×2): 81 mg via ORAL
  Filled 2013-02-17 (×3): qty 1

## 2013-02-17 MED ORDER — NITROGLYCERIN 0.4 MG SL SUBL: 0.4000 mg | SUBLINGUAL_TABLET | SUBLINGUAL | Status: DC | PRN

## 2013-02-17 NOTE — Assessment & Plan Note (Signed)
Known h/o DJD that has affected shoulders and he has known DJD knees. Inability to walk and bear weight at this visit seems more related to knee than back. After steroid injection right knee his pain was much better and he was able to ambulate out of the office.   Plan Ortho follow up for possible end-stage DJD right knee.

## 2013-02-17 NOTE — Progress Notes (Signed)
Subjective:  no change in condition: legs still hurt and are numb  Objective: Lab:  Recent Labs  02/17/13 1544  WBC 10.4  NEUTROABS 7.5  HGB 13.8  HCT 39.6  MCV 88.0  PLT 137*    Recent Labs  02/17/13 1544  NA 130*  K 4.2  CL 95*  GLUCOSE 159*  BUN 26*  CREATININE 1.52*  CALCIUM 9.0    Imaging: Knee films with advanced DJD, no effusion. MRI lumbar spine:       Study Result    *RADIOLOGY REPORT*  Clinical Data: 79 year old male with right flank pain radiating to  the right lower extremity.  MRI LUMBAR SPINE WITHOUT CONTRAST  Technique: Multiplanar and multiecho pulse sequences of the lumbar  spine were obtained without intravenous contrast.  Comparison: CT abdomen and pelvis 11/15/2011.  Findings: Normal lumbar segmentation depicted on comparison.  Chronic severe T11 compression fracture, stable. No marrow edema or  evidence of acute osseous abnormality.  The bladder is distended. Visible bilateral renal parenchyma  without definite hydronephrosis. Diverticulosis of the colon.  Visible lower thoracic spinal cord remarkable for spinal stenosis  related to the T11 compression fracture (mild retropulsion of bone.  No lower thoracic spinal cord definite compression or signal  abnormality. Normal appearance of the conus at L1.  T10-T11: Spinal and foraminal stenosis related to the T11  compression fracture and bilateral facet hypertrophy. The spinal  cord discussion above.  T11-T12: Mild spinal stenosis related to the T11 compression  fracture. Moderate facet hypertrophy. Mild to moderate bilateral  T11 foraminal stenosis.  T12-L1: Negative.  L1-L2: The right eccentric circumferential disc osteophyte  complex. Mild right lateral recess stenosis. No significant  spinal or foraminal stenosis.  L2-L3: Right eccentric circumferential disc osteophyte complex.  Mild facet and ligament flavum hypertrophy greater on the right.  No spinal stenosis. No significant  lateral recess stenosis. Mild  right L2 foraminal stenosis.  L3-L4: Right eccentric circumferential disc osteophyte complex.  Mild facet hypertrophy. No spinal or significant lateral recess  stenosis. Mild left and mild to moderate right L3 foraminal  stenosis.  L4-L5: Severe chronic disc space loss. Circumferential disc  osteophyte complex. Previous decompression of this level. No  spinal or lateral recess stenosis. Moderate left and mild right L4  foraminal stenosis.  L5-S1: Predominately far lateral bulky circumferential disc  osteophyte complex. Mild to moderate facet hypertrophy greater on  the left. No spinal or lateral recess stenosis. Moderate to  severe left and mild to moderate right L5 foraminal stenosis.  IMPRESSION:  1. Chronic-appearing lower thoracic spinal stenosis primarily  related to a chronic compression fracture T11 with mild  retropulsion. No definite lower spinal cord compression or signal  abnormality. Normal conus.  2. No lumbar spinal stenosis. There is multilevel lumbar neural  foraminal stenosis primarily related to disc and endplate  degeneration.  3. Distended bladder. Recommend Foley catheter placement.  4. Diverticulosis of the colon.    Scheduled Meds: . aspirin EC  81 mg Oral Daily  . enoxaparin (LOVENOX) injection  40 mg Subcutaneous Q24H  . furosemide  40 mg Oral BID  . gabapentin  300 mg Oral TID  . isosorbide mononitrate  60 mg Oral Daily  . methylPREDNISolone (SOLU-MEDROL) injection  40 mg Intravenous Q6H  . pantoprazole  40 mg Oral Daily  . potassium chloride SA  20 mEq Oral BID  . senna  1 tablet Oral BID   Continuous Infusions: . sodium chloride 50 mL/hr (02/17/13 1735)  PRN Meds:.nitroGLYCERIN, oxyCODONE, tiZANidine, traMADol, zolpidem   Physical Exam: Filed Vitals:   02/17/13 1510  BP: 127/76  Pulse: 86  Temp: 98 F (36.7 C)  Resp: 18        Assessment/Plan: 1. Radiculopathy bilateral LE - MRI reads as multi-level  DDD with moderate to severe foraminal stenosis left at L5-S1  Plan Add solumedrol 40 mg IV q6  NS consult 6/18   Illene Regulus [facility] (o) [phone]; (c) [phone] Call-grp - [facility]  Tele: [phone]  02/17/2013, 6:59 PM

## 2013-02-17 NOTE — Progress Notes (Signed)
  Subjective:    Patient ID: [name] Elliott, male    DOB: 01/07/1928, 79 y.o.   MRN: [id number]  HPI  For hospital admission.  Review of Systems     Objective:   Physical Exam        Assessment & Plan:

## 2013-02-17 NOTE — Progress Notes (Signed)
[name] Elliott [id number] Admitted to 5525: 02/17/2013 3:38 PM Attending Provider: Jacques Navy, MD    [name] Elliott is a 79 y.o. male patient direct admit for MD office, awake, alert  & orientated  X 3,  Full Code, VSS - Blood pressure 127/76, pulse 86, temperature 98 F (36.7 C), temperature source Oral, resp. rate 18, height 5\' 6"  (1.676 m), weight 71.396 kg (157 lb 6.4 oz), SpO2 99.00%.,  R/A, no c/o shortness of breath, no c/o chest pain, no distress noted. Non Tele.   IV site WDL:  forearm left, condition patent and no redness with a transparent dsg that's clean dry and intact.  Allergies:   Allergies  Allergen Reactions  . Crestor (Rosuvastatin Calcium)     Intolerance,  Back pain     Past Medical History  Diagnosis Date  . Constipation, chronic   . Sebaceous cyst   . Other malaise and fatigue   . Pain in joint, shoulder region     Has chronic shoulder pain  . Persistent disorder of initiating or maintaining sleep   . Pain in limb   . Thrombocytopenia, unspecified   . Dementia     with periods of amnesia  . Other B-complex deficiencies   . Hypothyroidism   . Pain in limb   . Esophageal reflux   . Unspecified essential hypertension   . Arthropathy, unspecified, site unspecified   . Pure hypercholesterolemia   . Pneumonia 2011  . Orthostasis   . PVC's (premature ventricular contractions)   . S/P cardiac cath 08/08/11    mild to moderate CAD primarily in the LAD. None are obstructive and appear stable from prior cath in 2007; managed medically  . Arthritis   . CAD (coronary artery disease)     last cath in 2012. Managed medically-some blockages  . Headache(784.0)   . 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 MRI Nov '11 - tendonosis and partial tear. Left shoulder surgery Jan '12 for partial shoulder replacement. [name])   . Shortness of breath     " once in awhile "  . Blood dyscrasia     thromboctopenia    History:  obtained from  the patient.  Pt orientation to unit, room and routine. Information packet given to patient/family and safety video watched.  Admission INP armband ID verified with patient/family, and in place. SR up x 2, fall risk assessment complete with Patient and family verbalizing understanding of risks associated with falls. Pt verbalizes an understanding of how to use the call bell and to call for help before getting out of bed.  Skin, clean-dry- intact without evidence of bruising, or skin tears.   No evidence of skin break down noted on exam, amputation to right pinky finger and left middle finger.    Will cont to monitor and assist as needed.  Joana Reamer, RN 02/17/2013 3:38 PM

## 2013-02-17 NOTE — H&P (Signed)
[name] Elliott is an 79 y.o. male.   Chief Complaint: Increasing pain in both legs HPI:  [name] was seen approx 10 days ago for leg pain bilaterally but worse on the right. His exam was without severe radiculopathy. The working diagnosis was lumbar spine disease and he was set up for an MRI lumbar spine Monday, June 16. That day he had marked increase in pain, especially the right leg so that he could not walk or bear weight w/o severe pain. He was seen in the office where exam revealed severe pain in the right knee. 80 mg depomedrol was injected right knee and he had improvement. He was able to walk out of the office using a cane. He returns today reporting increased pain now in both legs, weakness in both legs, inability to bear weight or walk due to pain and peripheral paresthesia both legs. On exam he does have pain with movement and decreased sensation to light touch and vibration. He is now admitted on observation to rule out acute disc herniation or nerve compression lumbar spine and for pain control.    Past Medical History  Diagnosis Date  . Constipation, chronic   . Sebaceous cyst   . Other malaise and fatigue   . Pain in joint, shoulder region     Has chronic shoulder pain  . Persistent disorder of initiating or maintaining sleep   . Pain in limb   . Thrombocytopenia, unspecified   . Dementia     with periods of amnesia  . Other B-complex deficiencies   . Hypothyroidism   . Pain in limb   . Esophageal reflux   . Unspecified essential hypertension   . Arthropathy, unspecified, site unspecified   . Pure hypercholesterolemia   . Pneumonia 2011  . Orthostasis   . PVC's (premature ventricular contractions)   . S/P cardiac cath 08/08/11    mild to moderate CAD primarily in the LAD. None are obstructive and appear stable from prior cath in 2007; managed medically  . Arthritis   . CAD (coronary artery disease)     last cath in 2012. Managed medically-some blockages  .  Headache(784.0)   . 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 MRI Nov '11 - tendonosis and partial tear. Left shoulder surgery Jan '12 for partial shoulder replacement. [name] Elliott)   . Shortness of breath     " once in awhile "  . Blood dyscrasia     thromboctopenia    Past Surgical History  Procedure Laterality Date  . Hand surgery      Right crush injury, right fifth digit contracture, limited  motion  . Lumbar spine surgery      2007, Dr Eloise Harman (Neurosurgeon)  . Knee surgery      age 67, x2  . Cataract extraction  2009  . Rotator cuff repair  8.30.2011  . US echocardiography  02-28-2010    Est EF 50-55%  . Cardiac catheterization      08/2011  . Shoulder hemi-arthroplasty    . Finger amputation      pinky finger right hand  . Hardware removal  02/05/2012    Procedure: HARDWARE REMOVAL;  Surgeon: Eulas Post, MD;  Location: [facility] OR;  Service: Orthopedics;  Laterality: Left;  . Reverse shoulder arthroplasty  02/05/2012    Procedure: REVERSE SHOULDER ARTHROPLASTY;  Surgeon: Eulas Post, MD;  Location: [facility] OR;  Service: Orthopedics;  Laterality: Left;  . Back surgery    .  Esophagogastroduodenoscopy N/A 11/19/2012    Procedure: ESOPHAGOGASTRODUODENOSCOPY (EGD);  Surgeon: Beverley Fiedler, MD;  Location: [facility];  Service: Gastroenterology;  Laterality: N/A;    Family History  Problem Relation Age of Onset  . Breast cancer Mother   . Cancer Brother     throat  . Diabetes Daughter   . Colon cancer Neg Hx   . Kidney disease Father    Social History:  reports that he quit smoking about 34 years ago. His smoking use included Cigars. His smokeless tobacco use includes Chew. He reports that he does not drink alcohol or use illicit drugs. 2nd grade education. Married '58, '95. 2 dtr '59, '64, youngest daughter died septic kidney. Lives alone, handles all ADLs   Allergies:  Allergies  Allergen Reactions  . Crestor (Rosuvastatin Calcium)      Intolerance,  Back pain    No current facility-administered medications on file prior to encounter.   Current Outpatient Prescriptions on File Prior to Encounter  Medication Sig Dispense Refill  . acetaminophen (TYLENOL) 500 MG tablet Take 1,000 mg by mouth every 6 (six) hours as needed for pain.      Marland Kitchen aspirin EC 81 MG tablet Take 81 mg by mouth daily.      . furosemide (LASIX) 40 MG tablet Take 40 mg by mouth 2 (two) times daily.      Marland Kitchen gabapentin (NEURONTIN) 300 MG capsule Take 300 mg by mouth 3 (three) times daily.      Marland Kitchen GLUCOSAMINE PO Take 1 tablet by mouth daily.      Marland Kitchen HYDROcodone-acetaminophen (NORCO) 5-325 MG per tablet Take 1 tablet by mouth every 6 (six) hours as needed for pain.  30 tablet  1  . isosorbide mononitrate (IMDUR) 60 MG 24 hr tablet Take 60 mg by mouth daily.      . nitroGLYCERIN (NITROSTAT) 0.4 MG SL tablet Place 0.4 mg under the tongue every 5 (five) minutes as needed for chest pain.      Marland Kitchen omeprazole (PRILOSEC) 40 MG capsule Take 1 capsule (40 mg total) by mouth daily.  90 capsule  3  . OxyCODONE HCl, Abuse Deter, 5 MG TABA Take 1 tablet by mouth 4 (four) times daily as needed.  60 tablet  0  . potassium chloride SA (K-DUR,KLOR-CON) 20 MEQ tablet Take 20 mEq by mouth 2 (two) times daily.      . predniSONE (DELTASONE) 10 MG tablet 3 tabs by mouth per day for 3 days,2tabs per day for 3 days,1tab per day for 3 days  18 tablet  0  . tiZANidine (ZANAFLEX) 4 MG tablet Take 1 tablet (4 mg total) by mouth every 6 (six) hours as needed.  40 tablet  0  . traMADol (ULTRAM) 50 MG tablet Take 1 tablet (50 mg total) by mouth every 8 (eight) hours as needed for pain.  100 tablet  2  . [DISCONTINUED] Hyoscyamine Sulfate (HYOMAX-DT) 0.375 MG TBCR Take 1 tab bid for 5 days, then prn abd pain  30 each  1   Recent Results (from the past 2160 hour(s))  HEMOGLOBIN AND HEMATOCRIT, BLOOD     Status: Abnormal   Collection Time    11/21/12  5:49 AM      Result Value Range   Hemoglobin  12.6 (*) 13.0 - 17.0 g/dL   Comment: REPEATED TO VERIFY     DELTA CHECK NOTED   HCT 36.3 (*) 39.0 - 52.0 %  BASIC METABOLIC PANEL  Status: Abnormal   Collection Time    11/26/12  9:13 AM      Result Value Range   Sodium 138  135 - 145 mEq/L   Potassium 3.8  3.5 - 5.1 mEq/L   Chloride 101  96 - 112 mEq/L   CO2 26  19 - 32 mEq/L   Glucose, Bld 123 (*) 70 - 99 mg/dL   BUN 11  6 - 23 mg/dL   Creatinine, Ser 1.3  0.4 - 1.5 mg/dL   Calcium 9.5  8.4 - 16.1 mg/dL   GFR 09.60 (*) >45.40 mL/min  LIPID PANEL     Status: Abnormal   Collection Time    11/26/12  9:13 AM      Result Value Range   Cholesterol 149  0 - 200 mg/dL   Comment: ATP III Classification       Desirable:  < 200 mg/dL               Borderline High:  200 - 239 mg/dL          High:  > = 981 mg/dL   Triglycerides 19.1  0.0 - 149.0 mg/dL   Comment: Normal:  <478 mg/dLBorderline High:  150 - 199 mg/dL   HDL 29.56 (*) >21.30 mg/dL   VLDL 86.5  0.0 - 78.4 mg/dL   LDL Cholesterol 91  0 - 99 mg/dL   Total CHOL/HDL Ratio 4     Comment:                Men          Women1/2 Average Risk     3.4          3.3Average Risk          5.0          4.42X Average Risk          9.6          7.13X Average Risk          15.0          11.0                      HEPATIC FUNCTION PANEL     Status: None   Collection Time    11/26/12  9:13 AM      Result Value Range   Total Bilirubin 0.6  0.3 - 1.2 mg/dL   Bilirubin, Direct 0.1  0.0 - 0.3 mg/dL   Alkaline Phosphatase 53  39 - 117 U/L   AST 20  0 - 37 U/L   ALT 15  0 - 53 U/L   Total Protein 7.8  6.0 - 8.3 g/dL   Albumin 3.9  3.5 - 5.2 g/dL  HEMOGLOBIN AND HEMATOCRIT, BLOOD     Status: None   Collection Time    12/09/12 12:06 PM      Result Value Range   Hemoglobin 14.4  13.0 - 17.0 g/dL   HCT 69.6  29.5 - 28.4 %  URINALYSIS, ROUTINE W REFLEX MICROSCOPIC     Status: None   Collection Time    02/06/13  5:10 PM      Result Value Range   Color, Urine LT. YELLOW  Yellow;Lt. Yellow    APPearance CLEAR  Clear   Specific Gravity, Urine 1.020  1.000-1.030   pH 5.5  5.0 - 8.0   Total Protein, Urine NEGATIVE  Negative   Urine Glucose NEGATIVE  Negative  Ketones, ur NEGATIVE  Negative   Bilirubin Urine NEGATIVE  Negative   Hgb urine dipstick NEGATIVE  Negative   Urobilinogen, UA 0.2  0.0 - 1.0   Leukocytes, UA NEGATIVE  Negative   Nitrite NEGATIVE  Negative        Review of Systems  HENT: Negative.   Eyes: Negative.   Respiratory: Negative.   Cardiovascular: Negative.   Gastrointestinal: Negative.   Genitourinary: Negative.   Musculoskeletal: Positive for back pain, joint pain and falls.  Skin: Negative.   Neurological: Positive for sensory change, focal weakness and weakness.  Endo/Heme/Allergies: Negative.   Psychiatric/Behavioral: Positive for memory loss. The patient is nervous/anxious.     There were no vitals taken for this visit. Physical Exam  Constitutional: He is oriented to person, place, and time. He appears well-developed and well-nourished.  HENT:  Head: Normocephalic and atraumatic.  Mouth/Throat: Oropharynx is clear and moist.  dentures  Eyes: Conjunctivae and EOM are normal. Pupils are equal, round, and reactive to light. Right eye exhibits no discharge. Left eye exhibits no discharge. No scleral icterus.  Neck: Neck supple. No JVD present. No tracheal deviation present. No thyromegaly present.  Cardiovascular: Normal rate, regular rhythm and normal heart sounds.   No murmur heard. Respiratory: Effort normal and breath sounds normal. No stridor.  GI: Soft. Bowel sounds are normal.  Musculoskeletal: Normal range of motion. He exhibits edema. He exhibits no tenderness.  Knees w/o effusion, w/o deformity. Very tender w/ passive ROM. Very tender along the joint line, right more tender the left. Ankles and calves normal. Painful to straighten legs. Unable to ambulate or stand and bear weight.   Lymphadenopathy:    He has no cervical  adenopathy.  Neurological: He is alert and oriented to person, place, and time. No cranial nerve deficit. Coordination normal.  DTR's diminished at patellar tendon. Normal strength both legs and UE. Decreased sensation to light touch and vibration from mid-calve down.  Skin: Skin is warm and dry. No rash noted. No erythema.  Psychiatric: He has a normal mood and affect. His behavior is normal. Judgment and thought content normal.     Assessment/Plan 1. Neuro - patient with severe pain and loss of feeling in the legs. He is unable to walk or bear weight. High degree of concern for progressive and acute disc disease lumbar spine.  Plan Observation admit  MRI w/o contrast lumbar spine  Narcotic pain management and muscle relaxants.  2. Knee pain - patient with h/o severe OA shoulders and knees. He may have progressive end-stage knee DJD which contributes to his immobility.  3. CAD - patient is stable with medical management and he has no cardiac symptoms  Plan - Continue home medications  4. Dementia - mild dementia but he is able to live alone and manage ADLs.   Plan Bilateral knee imaging.  Illene Regulus 02/17/2013, 2:33 PM

## 2013-02-18 DIAGNOSIS — IMO0002 Reserved for concepts with insufficient information to code with codable children: Secondary | ICD-10-CM

## 2013-02-18 DIAGNOSIS — I10 Essential (primary) hypertension: Secondary | ICD-10-CM

## 2013-02-18 DIAGNOSIS — M545 Low back pain: Secondary | ICD-10-CM

## 2013-02-18 MED ORDER — ENSURE COMPLETE PO LIQD
237.0000 mL | ORAL | Status: DC
  Administered 2013-02-18: 237 mL via ORAL

## 2013-02-18 MED ORDER — SODIUM CHLORIDE 0.9 % IV SOLN
INTRAVENOUS | Status: DC
  Administered 2013-02-18: 06:00:00 via INTRAVENOUS
  Administered 2013-02-18: 1000 mL via INTRAVENOUS

## 2013-02-18 NOTE — Evaluation (Signed)
Occupational Therapy Evaluation Patient Details Name: [name] Elliott MRN: [id number] DOB: 10/25/1927 Today's Date: 02/18/2013 Time: 1345-1410 OT Time Calculation (min): 25 min  OT Assessment / Plan / Recommendation Clinical Impression  Pt admitted with bilateral LE pain with a hx of back surgery, DDD, stenosis, LE edema with use of compression hose. Pain is specific to calves. Pt demonstrates mild unsteadiness requiring a cane and supervision for mobility.  Discussed benefits of DME for toilet and tub.  Pt is not sure they will fit in his bathroom  Will defer to HHOT.    OT Assessment  All further OT needs can be met in the next venue of care    Follow Up Recommendations  Home health OT    Barriers to Discharge      Equipment Recommendations   (to be determined by [facility])    Recommendations for Other Services    Frequency       Precautions / Restrictions Precautions Precautions: None   Pertinent Vitals/Pain B calves, did not rate, premedicated, repositioned   ADL  Eating/Feeding: Independent Where Assessed - Eating/Feeding: Chair Grooming: Wash/dry hands;Supervision/safety Where Assessed - Grooming: Unsupported standing Upper Body Bathing: Set up Where Assessed - Upper Body Bathing: Unsupported sitting Lower Body Bathing: Supervision/safety Where Assessed - Lower Body Bathing: Unsupported sitting;Supported sit to stand Upper Body Dressing: Set up Where Assessed - Upper Body Dressing: Unsupported sitting Lower Body Dressing: Supervision/safety Where Assessed - Lower Body Dressing: Unsupported sitting;Supported sit to stand Toilet Transfer: Supervision/safety Toilet Transfer Method: Sit to Barista: Regular height toilet Toileting - Clothing Manipulation and Hygiene: Supervision/safety Where Assessed - Engineer, mining and Hygiene: Sit to stand from 3-in-1 or toilet Equipment Used: Gait belt;Cane Transfers/Ambulation Related to ADLs:  supervision with cane ADL Comments: Educated pt on use of 3 in 1 and tub transfer vs tub seat for showering.    OT Diagnosis: Generalized weakness;Acute pain  OT Problem List: Decreased strength;Decreased activity tolerance;Impaired balance (sitting and/or standing);Decreased knowledge of use of DME or AE;Pain OT Treatment Interventions:     OT Goals    Visit Information  Last OT Received On: 02/18/13 Assistance Needed: +1    Subjective Data  Subjective: I have a hard time standing up when the seat is low. Patient Stated Goal: Find out why his legs hurt.   Prior Functioning     Home Living Lives With: Alone Available Help at Discharge: Family;Available PRN/intermittently Type of Home: House Home Access: Stairs to enter [employer] of Steps: 1 Home Layout: One level Bathroom Shower/Tub: Tub/shower unit;Curtain Bathroom Toilet: Standard Home Adaptive Equipment: Straight cane Additional Comments: Just began using cane since leg pain began. Prior Function Level of Independence: Independent with assistive device(s) Able to Take Stairs?: Yes Vocation: Retired Musician: [employer] Dominant Hand: Right         Vision/Perception     Cognition  Cognition Arousal/Alertness: Awake/alert Behavior During Therapy: WFL for tasks assessed/performed Overall Cognitive Status: Within Functional Limits for tasks assessed    Extremity/Trunk Assessment Right Upper Extremity Assessment RUE ROM/Strength/Tone: WFL for tasks assessed (amputated 4th finger, arthritic changes) Left Upper Extremity Assessment LUE ROM/Strength/Tone: Deficits LUE ROM/Strength/Tone Deficits: longstanding shoulder limitations since TSA on year ago, can FF to 100 degrees, arthritic changes, amputated finger tip of 2nd finger Right Lower Extremity Assessment RLE ROM/Strength/Tone: [facility] for tasks assessed Left Lower Extremity Assessment LLE ROM/Strength/Tone: [facility] for tasks assessed      Mobility Bed Mobility Bed Mobility: Not assessed Supine to Sit:  6: Modified independent (Device/Increase time);HOB elevated Sitting - Scoot to Edge of Bed: 7: Independent Transfers Transfers: Stand to Sit;Sit to Stand Sit to Stand: 5: Supervision;With upper extremity assist;From chair/3-in-1 Stand to Sit: 5: Supervision;With upper extremity assist;To chair/3-in-1     Exercise     Balance Balance Balance Assessed: Yes Static Standing Balance Static Standing - Balance Support: Right upper extremity supported Static Standing - Level of Assistance: 5: Stand by assistance   End of Session OT - End of Session Activity Tolerance: Patient limited by pain Patient left: in chair;with call bell/phone within reach Nurse Communication: Mobility status (pain is in bilateral calves, hx of phlebitis and use of comp)  GO     [name] 02/18/2013, 2:32 PM [phone]

## 2013-02-18 NOTE — Consult Note (Signed)
Reason for Consult:back pain, inability to walk Referring Physician: Debby Bud, [name] Elliott is an 79 y.o. male.  HPI: whom was being evaluated for low back pain. He has been complaining of back pain for a number of years, with an acute exacerbation of pain over the last few weeks. He stated the pain was very severe initially in his lower back. He has had a previous decompression of the lumbar spine sometime after 1995 and before 2000. He states he did very well after the decompression. There is however an MRI scan of the lumbar spine from 2006 indicating the back pain is a long standing issue. He was seen in Dr. Debby Bud absence in the beginning of June by Dr. Jonny Ruiz and described as follows"Pt of Dr Debby Bud, here with 1 mo gradually worsening mid and right lbp, initially milder to mod, worse with standing up, but now sharp and burning, mod to severe, more constant, radiates to right knee level and fortunately not assoc with RLE more distal pain/weakness/numbness though does quite a bit of difficultly walking now due to pain, worse to walk more than 50 ft. Sitting and lying down can help but still at least mild persistent". An MRI was scheduled but [name] Elliott pain became unbearable and he could no longer walk. He was admitted to the hospital for pain control and the MRI. Per the MRI report I was asked to see [name] Elliott for evaluation of surgical treatment. He denies any new bowel/bladder dysfunction. He states that he currently does not have significant back pain, and that most of his pain is in the knees and legs. He denies thigh pain bilaterally. Xrays of the knees have shown no acute changes.    Past Medical History  Diagnosis Date  . Constipation, chronic   . Sebaceous cyst   . Other malaise and fatigue   . Pain in joint, shoulder region     Has chronic shoulder pain  . Persistent disorder of initiating or maintaining sleep   . Thrombocytopenia, unspecified   . Dementia     with periods  of amnesia  . Other B-complex deficiencies   . Hypothyroidism   . Pain in limb   . Esophageal reflux   . Unspecified essential hypertension   . Arthropathy, unspecified, site unspecified   . Pure hypercholesterolemia   . Orthostasis   . PVC's (premature ventricular contractions)   . S/P cardiac cath 08/08/11    mild to moderate CAD primarily in the LAD. None are obstructive and appear stable from prior cath in 2007; managed medically  . Arthritis   . CAD (coronary artery disease)     last cath in 2012. Managed medically-some blockages  . Headache(784.0)   . 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 MRI Nov '11 - tendonosis and partial tear. Left shoulder surgery Jan '12 for partial shoulder replacement. [name])   . Blood dyscrasia     thromboctopenia  . Pneumonia 1980's?; 2011  . History of stomach ulcers 11/2012  . Chronic lower back pain     Past Surgical History  Procedure Laterality Date  . Hand surgery Right     crush injury, right fifth digit contracture, limited  motion  . Lumbar spine surgery  2007    Dr Eloise Harman (Neurosurgeon)  . Knee surgery Left 1991    "did it twice in 1 wk" (02/17/2013)  . Cataract extraction Left 2009  . Shoulder open rotator cuff repair Left 8.30.2011  . US echocardiography  02-28-2010    Est EF 50-55%  . Cardiac catheterization  08/2011  . Shoulder hemi-arthroplasty    . Finger amputation Right     pinky finger  . Hardware removal  02/05/2012    Procedure: HARDWARE REMOVAL;  Surgeon: Eulas Post, MD;  Location: [facility] OR;  Service: Orthopedics;  Laterality: Left;  . Reverse shoulder arthroplasty  02/05/2012    Procedure: REVERSE SHOULDER ARTHROPLASTY;  Surgeon: Eulas Post, MD;  Location: [facility] OR;  Service: Orthopedics;  Laterality: Left;  . Back surgery    . Esophagogastroduodenoscopy N/A 11/19/2012    Procedure: ESOPHAGOGASTRODUODENOSCOPY (EGD);  Surgeon: Beverley Fiedler, MD;  Location: [facility] ENDOSCOPY;  Service:  Gastroenterology;  Laterality: N/A;    Family History  Problem Relation Age of Onset  . Breast cancer Mother   . Cancer Brother     throat  . Diabetes Daughter   . Colon cancer Neg Hx   . Kidney disease Father     Social History:  reports that he quit smoking about 34 years ago. His smoking use included Cigars. His smokeless tobacco use includes Chew. He reports that he does not drink alcohol or use illicit drugs.  Allergies:  Allergies  Allergen Reactions  . Crestor (Rosuvastatin Calcium)     Intolerance,  Back pain    Medications: I have reviewed the patient's current medications.  Results for orders placed during the hospital encounter of 02/17/13 (from the past 48 hour(s))  COMPREHENSIVE METABOLIC PANEL     Status: Abnormal   Collection Time    02/17/13  3:44 PM      Result Value Range   Sodium 130 (*) 135 - 145 mEq/L   Potassium 4.2  3.5 - 5.1 mEq/L   Chloride 95 (*) 96 - 112 mEq/L   CO2 25  19 - 32 mEq/L   Glucose, Bld 159 (*) 70 - 99 mg/dL   BUN 26 (*) 6 - 23 mg/dL   Creatinine, Ser 2.95 (*) 0.50 - 1.35 mg/dL   Calcium 9.0  8.4 - 62.1 mg/dL   Total Protein 6.9  6.0 - 8.3 g/dL   Albumin 3.4 (*) 3.5 - 5.2 g/dL   AST 18  0 - 37 U/L   ALT 9  0 - 53 U/L   Alkaline Phosphatase 46  39 - 117 U/L   Total Bilirubin 1.1  0.3 - 1.2 mg/dL   GFR calc non Af Amer 41 (*) >90 mL/min   GFR calc Af Amer 47 (*) >90 mL/min   Comment:            The eGFR has been calculated     using the CKD EPI equation.     This calculation has not been     validated in all clinical     situations.     eGFR's persistently     <90 mL/min signify     possible Chronic Kidney Disease.  CBC WITH DIFFERENTIAL     Status: Abnormal   Collection Time    02/17/13  3:44 PM      Result Value Range   WBC 10.4  4.0 - 10.5 K/uL   RBC 4.50  4.22 - 5.81 MIL/uL   Hemoglobin 13.8  13.0 - 17.0 g/dL   HCT 30.8  65.7 - 84.6 %   MCV 88.0  78.0 - 100.0 fL   MCH 30.7  26.0 - 34.0 pg   MCHC 34.8  30.0 - 36.0  g/dL   RDW 14.9  11.5 - 15.5 %   Platelets 137 (*) 150 - 400 K/uL   Neutrophils Relative % 72  43 - 77 %   Neutro Abs 7.5  1.7 - 7.7 K/uL   Lymphocytes Relative 16  12 - 46 %   Lymphs Abs 1.7  0.7 - 4.0 K/uL   Monocytes Relative 12  3 - 12 %   Monocytes Absolute 1.3 (*) 0.1 - 1.0 K/uL   Eosinophils Relative 0  0 - 5 %   Eosinophils Absolute 0.0  0.0 - 0.7 K/uL   Basophils Relative 0  0 - 1 %   Basophils Absolute 0.0  0.0 - 0.1 K/uL    Dg Knee 1-2 Views Left  02/17/2013   *RADIOLOGY REPORT*  Clinical Data: Severe knee pain  LEFT KNEE - 1-2 VIEW  Comparison: None.  Findings: Degenerative changes are noted most marked in the medial joint space.  A small joint effusion is identified.  Diffuse vascular calcifications are noted as well as meniscal calcifications.  IMPRESSION: Degenerative change with mild joint effusion.   Original Report Authenticated By: Alcide Clever, M.D.   Dg Knee 1-2 Views Right  02/17/2013   *RADIOLOGY REPORT*  Clinical Data: Right knee pain  RIGHT KNEE - 1-2 VIEW  Comparison: None.  Findings: Degenerative changes are noted similar to that seen on the left.  No sizable joint effusion is identified.  Diffuse vascular meniscal calcifications are seen.  IMPRESSION: Degenerative changes without acute abnormality.   Original Report Authenticated By: Alcide Clever, M.D.   Mr Lumbar Spine Wo Contrast  02/17/2013   *RADIOLOGY REPORT*  Clinical Data: 79 year old male with right flank pain radiating to the right lower extremity.  MRI LUMBAR SPINE WITHOUT CONTRAST  Technique:  Multiplanar and multiecho pulse sequences of the lumbar spine were obtained without intravenous contrast.  Comparison: CT abdomen and pelvis 11/15/2011.  Findings: Normal lumbar segmentation depicted on comparison. Chronic severe T11 compression fracture, stable. No marrow edema or evidence of acute osseous abnormality.  The bladder is distended.  Visible bilateral renal parenchyma without definite hydronephrosis.   Diverticulosis of the colon.  Visible lower thoracic spinal cord remarkable for spinal stenosis related to the T11 compression fracture (mild retropulsion of bone. No lower thoracic spinal cord definite compression or signal abnormality.  Normal appearance of the conus at L1.  T10-T11:  Spinal and foraminal stenosis related to the T11 compression fracture and bilateral facet hypertrophy.  The spinal cord discussion above.  T11-T12:  Mild spinal stenosis related to the T11 compression fracture.  Moderate facet hypertrophy.  Mild to moderate bilateral T11 foraminal stenosis.  T12-L1:  Negative.  L1-L2:  The right eccentric circumferential disc osteophyte complex.  Mild right lateral recess stenosis.  No significant spinal or foraminal stenosis.  L2-L3:  Right eccentric circumferential disc osteophyte complex. Mild facet and ligament flavum hypertrophy greater on the right. No spinal stenosis.  No significant lateral recess stenosis.  Mild right L2 foraminal stenosis.  L3-L4:  Right eccentric circumferential disc osteophyte complex. Mild facet hypertrophy.  No spinal or significant lateral recess stenosis.  Mild left and mild to moderate right L3 foraminal stenosis.  L4-L5:  Severe chronic disc space loss.  Circumferential disc osteophyte complex.  Previous decompression of this level.  No spinal or lateral recess stenosis.  Moderate left and mild right L4 foraminal stenosis.  L5-S1:  Predominately far lateral bulky circumferential disc osteophyte complex.  Mild to moderate facet hypertrophy greater on the left.  No spinal or lateral  recess stenosis.  Moderate to severe left and mild to moderate right L5 foraminal stenosis.  IMPRESSION: 1.  Chronic-appearing lower thoracic spinal stenosis primarily related to a chronic compression fracture T11 with mild retropulsion.  No definite lower spinal cord compression or signal abnormality.  Normal conus. 2.  No lumbar spinal stenosis.  There is multilevel lumbar neural  foraminal stenosis primarily related to disc and endplate degeneration. 3.  Distended bladder.  Recommend Foley catheter placement. 4.  Diverticulosis of the colon.   Original Report Authenticated By: Erskine Speed, M.D.    Review of Systems  HENT: Negative.   Respiratory: Negative.   Cardiovascular: Negative.   Gastrointestinal: Positive for constipation.  Genitourinary: Negative.   Musculoskeletal: Positive for back pain and joint pain.  Skin: Negative.   Neurological: Positive for focal weakness and weakness.  Endo/Heme/Allergies: Negative.   Psychiatric/Behavioral: Negative.    Blood pressure 139/78, pulse 56, temperature 97.7 F (36.5 C), temperature source Oral, resp. rate 16, height 5\' 6"  (1.676 m), weight 71.396 kg (157 lb 6.4 oz), SpO2 96.00%. Physical Exam  Constitutional: He is oriented to person, place, and time. He appears well-developed and well-nourished. He appears distressed.  HENT:  Head: Normocephalic.  Eyes: Conjunctivae and EOM are normal. Pupils are equal, round, and reactive to light.  Neck: Normal range of motion. Neck supple.  Cardiovascular: Normal rate, regular rhythm and normal heart sounds.   Respiratory: Effort normal and breath sounds normal.  GI: Soft. Bowel sounds are normal.  Musculoskeletal:       Right knee: He exhibits decreased range of motion. Tenderness found.       Left knee: He exhibits decreased range of motion. Tenderness found.  Decreased range of motion about the knees bilaterally.   Neurological: He is alert and oriented to person, place, and time. No cranial nerve deficit. He exhibits abnormal muscle tone. Coordination normal. He displays no Babinski's sign on the right side. He displays no Babinski's sign on the left side.  Increased tone in lower extremities Gait not assessed Proprioception normal in upper and lower extremities Strength is normal in the upper extremities. Full effort not given when I examined the lower extremities due  to pain  Skin: Skin is warm. He is diaphoretic.  Psychiatric: He has a normal mood and affect. His behavior is normal. Judgment and thought content normal.    Assessment/Plan: [name] Elliott has lumbar spondylosis. However the two mri scans from 2006, and 2014 are almost identical. He does not on exam display a picture of radicular pain which typically will not skip( no thigh involvement). There is no significant foraminal compression of the nerve roots to explain what he describes. He does have a great deal of pain when standing, and on direct palpation of the knees especially on the right. I do not think his problem stems from the spine, and do not believe surgical intervention would be beneficial.   Tyshawna Alarid L 02/18/2013, 11:41 AM

## 2013-02-18 NOTE — Progress Notes (Signed)
Subjective: [name] reports that his legs don't hurt as much but he feels he cannot move them normally. He did sleep but reports having a night sweat. No other complaints  Objective: Lab:  Recent Labs  02/17/13 1544  WBC 10.4  NEUTROABS 7.5  HGB 13.8  HCT 39.6  MCV 88.0  PLT 137*    Recent Labs  02/17/13 1544  NA 130*  K 4.2  CL 95*  GLUCOSE 159*  BUN 26*  CREATININE 1.52*  CALCIUM 9.0    Imaging: MRI Lumbar spine 02/17/13: *RADIOLOGY REPORT*  Clinical Data: 79 year old male with right flank pain radiating to  the right lower extremity.  MRI LUMBAR SPINE WITHOUT CONTRAST  Technique: Multiplanar and multiecho pulse sequences of the lumbar  spine were obtained without intravenous contrast.  Comparison: CT abdomen and pelvis 11/15/2011.  Findings: Normal lumbar segmentation depicted on comparison.  Chronic severe T11 compression fracture, stable. No marrow edema or  evidence of acute osseous abnormality.  The bladder is distended. Visible bilateral renal parenchyma  without definite hydronephrosis. Diverticulosis of the colon.  Visible lower thoracic spinal cord remarkable for spinal stenosis  related to the T11 compression fracture (mild retropulsion of bone.  No lower thoracic spinal cord definite compression or signal  abnormality. Normal appearance of the conus at L1.  T10-T11: Spinal and foraminal stenosis related to the T11  compression fracture and bilateral facet hypertrophy. The spinal  cord discussion above.  T11-T12: Mild spinal stenosis related to the T11 compression  fracture. Moderate facet hypertrophy. Mild to moderate bilateral  T11 foraminal stenosis.  T12-L1: Negative.  L1-L2: The right eccentric circumferential disc osteophyte  complex. Mild right lateral recess stenosis. No significant  spinal or foraminal stenosis.  L2-L3: Right eccentric circumferential disc osteophyte complex.  Mild facet and ligament flavum hypertrophy greater on the  right.  No spinal stenosis. No significant lateral recess stenosis. Mild  right L2 foraminal stenosis.  L3-L4: Right eccentric circumferential disc osteophyte complex.  Mild facet hypertrophy. No spinal or significant lateral recess  stenosis. Mild left and mild to moderate right L3 foraminal  stenosis.  L4-L5: Severe chronic disc space loss. Circumferential disc  osteophyte complex. Previous decompression of this level. No  spinal or lateral recess stenosis. Moderate left and mild right L4  foraminal stenosis.  L5-S1: Predominately far lateral bulky circumferential disc  osteophyte complex. Mild to moderate facet hypertrophy greater on  the left. No spinal or lateral recess stenosis. Moderate to  severe left and mild to moderate right L5 foraminal stenosis.  IMPRESSION:  1. Chronic-appearing lower thoracic spinal stenosis primarily  related to a chronic compression fracture T11 with mild  retropulsion. No definite lower spinal cord compression or signal  abnormality. Normal conus.  2. No lumbar spinal stenosis. There is multilevel lumbar neural  foraminal stenosis primarily related to disc and endplate  degeneration.  3. Distended bladder. Recommend Foley catheter placement.  4. Diverticulosis of the colon.   Scheduled Meds: . aspirin EC  81 mg Oral Daily  . enoxaparin (LOVENOX) injection  40 mg Subcutaneous Q24H  . furosemide  40 mg Oral BID  . gabapentin  300 mg Oral TID  . isosorbide mononitrate  60 mg Oral Daily  . methylPREDNISolone (SOLU-MEDROL) injection  40 mg Intravenous Q6H  . pantoprazole  40 mg Oral Daily  . potassium chloride SA  20 mEq Oral BID  . senna  1 tablet Oral BID   Continuous Infusions: . sodium chloride 50 mL/hr (02/17/13 1735)  PRN Meds:.nitroGLYCERIN, oxyCODONE, tiZANidine, traMADol, zolpidem   Physical Exam: Filed Vitals:   02/18/13 0445  BP: 139/78  Pulse: 56  Temp: 97.7 F (36.5 C)  Resp: 16    Intake/Output Summary (Last 24 hours)  at 02/18/13 0557 Last data filed at 02/18/13 0003  Gross per 24 hour  Intake      0 ml  Output   1300 ml  Net  -1300 ml   Gen'l- Elderly white man in no distress HEENT- C&S clear Cor 2+ radial pulse, RRR Pulm - normal respirations Abd- soft Neuro - able to lift each leg 10 degrees off the bed. Decreased sensation to light touch ankles and feet.      Assessment/Plan: 1. Neuro - multi-level disk disease with moderate to severe foraminal stenosis L5-S1 with radicular symptoms. Pain is improved with high dose IV steroids  Plan Continue solumedrol  NS consult  If not for surgery would send home on continue steroid burst and taper.  2. Knee pain - chronic DJD.   3. CAD - stable  4. Dementia - mild. Has decision making capacity.  5. F/E/N - low sodium, elevated creatinine and BUN c/w dehydration  Plan  NS at 75 cc/hr  Bmet in AM   [employer] IM (o) [phone]; (c) [phone] Call-grp - [name] Elliott IM  Tele: [phone]  02/18/2013, 5:53 AM

## 2013-02-18 NOTE — Progress Notes (Signed)
INITIAL NUTRITION ASSESSMENT  DOCUMENTATION CODES Per approved criteria  -Not Applicable   INTERVENTION: 1. Ensure Complete po daily, each supplement provides 350 kcal and 13 grams of protein.   NUTRITION DIAGNOSIS: Unintentional weight loss related to unknown etiology as evidenced by weight trends.   Goal: PO intake to meet >/=90% estimated nutrition needs  Monitor:  PO intake, weight trends, labs   Reason for Assessment: Malnutrition Screening Tool  79 y.o. male  Admitting Dx: Pain and loss of feeling in legs   ASSESSMENT: Pt presented to ED with increasing pain in both legs, unable to bear weight with out severe pain. MRI shows multi-level DDD with moderate to severe foraminal stenosis at L5-S1. Neurology consulted and steroids added.   Pt states his appetite is normal, ate most of his breakfast. Endorses >10 lb weight loss in the past 2-3 months, weight hx does not support this. Has lost 6 lbs in the past month.   Height: Ht Readings from Last 1 Encounters:  02/17/13 5\' 6"  (1.676 m)    Weight: Wt Readings from Last 1 Encounters:  02/17/13 157 lb 6.4 oz (71.396 kg)    Ideal Body Weight: 142 lbs   % Ideal Body Weight: 110%  Wt Readings from Last 10 Encounters:  02/17/13 157 lb 6.4 oz (71.396 kg)  02/16/13 159 lb 12.8 oz (72.485 kg)  02/06/13 163 lb (73.936 kg)  12/03/12 159 lb 4 oz (72.235 kg)  11/26/12 161 lb 12.8 oz (73.392 kg)  11/26/12 162 lb (73.483 kg)  11/19/12 159 lb 8 oz (72.349 kg)  11/19/12 159 lb 8 oz (72.349 kg)  11/17/12 166 lb (75.297 kg)  05/29/12 162 lb 6.4 oz (73.664 kg)    Usual Body Weight: 162 lbs   % Usual Body Weight: 97%  BMI:  Body mass index is 25.42 kg/(m^2). Overweight   Estimated Nutritional Needs: Kcal: 1550-1800 Protein: 70-85 gm  Fluid: 1.6-1.8 L   Skin: intact   Diet Order: General  EDUCATION NEEDS: -No education needs identified at this time   Intake/Output Summary (Last 24 hours) at 02/18/13 1113 Last  data filed at 02/18/13 0616  Gross per 24 hour  Intake      0 ml  Output   1975 ml  Net  -1975 ml    Last BM: 6/18   Labs:   Recent Labs Lab 02/17/13 1544  NA 130*  K 4.2  CL 95*  CO2 25  BUN 26*  CREATININE 1.52*  CALCIUM 9.0  GLUCOSE 159*    CBG (last 3)  No results found for this basename: GLUCAP,  in the last 72 hours  Scheduled Meds: . aspirin EC  81 mg Oral Daily  . enoxaparin (LOVENOX) injection  40 mg Subcutaneous Q24H  . furosemide  40 mg Oral BID  . gabapentin  300 mg Oral TID  . isosorbide mononitrate  60 mg Oral Daily  . methylPREDNISolone (SOLU-MEDROL) injection  40 mg Intravenous Q6H  . pantoprazole  40 mg Oral Daily  . potassium chloride SA  20 mEq Oral BID  . senna  1 tablet Oral BID    Continuous Infusions: . sodium chloride Stopped (02/18/13 0626)  . sodium chloride 75 mL/hr at 02/18/13 4098    Past Medical History  Diagnosis Date  . Constipation, chronic   . Sebaceous cyst   . Other malaise and fatigue   . Pain in joint, shoulder region     Has chronic shoulder pain  . Persistent disorder of initiating  or maintaining sleep   . Thrombocytopenia, unspecified   . Dementia     with periods of amnesia  . Other B-complex deficiencies   . Hypothyroidism   . Pain in limb   . Esophageal reflux   . Unspecified essential hypertension   . Arthropathy, unspecified, site unspecified   . Pure hypercholesterolemia   . Orthostasis   . PVC's (premature ventricular contractions)   . S/P cardiac cath 08/08/11    mild to moderate CAD primarily in the LAD. None are obstructive and appear stable from prior cath in 2007; managed medically  . Arthritis   . CAD (coronary artery disease)     last cath in 2012. Managed medically-some blockages  . Headache(784.0)   . 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 MRI Nov '11 - tendonosis and partial tear. Left shoulder surgery Jan '12 for partial shoulder replacement. [name])   .  Blood dyscrasia     thromboctopenia  . Pneumonia 1980's?; 2011  . History of stomach ulcers 11/2012  . Chronic lower back pain     Past Surgical History  Procedure Laterality Date  . Hand surgery Right     crush injury, right fifth digit contracture, limited  motion  . Lumbar spine surgery  2007    Dr Eloise Harman (Neurosurgeon)  . Knee surgery Left 1991    "did it twice in 1 wk" (02/17/2013)  . Cataract extraction Left 2009  . Shoulder open rotator cuff repair Left 8.30.2011  . US echocardiography  02-28-2010    Est EF 50-55%  . Cardiac catheterization  08/2011  . Shoulder hemi-arthroplasty    . Finger amputation Right     pinky finger  . Hardware removal  02/05/2012    Procedure: HARDWARE REMOVAL;  Surgeon: Eulas Post, MD;  Location[facility] OR;  Service: Orthopedics;  Laterality: Left;  . Reverse shoulder arthroplasty  02/05/2012    Procedure: REVERSE SHOULDER ARTHROPLASTY;  Surgeon: Eulas Post, MD;  Location: [facility] OR;  Service: Orthopedics;  Laterality: Left;  . Back surgery    . Esophagogastroduodenoscopy N/A 11/19/2012    Procedure: ESOPHAGOGASTRODUODENOSCOPY (EGD);  Surgeon: Beverley Fiedler, MD;  Location: [facility] ENDOSCOPY;  Service: Gastroenterology;  Laterality: N/A;    Clarene Duke RD, LDN Pager [phone] After Hours pager [phone]

## 2013-02-18 NOTE — Plan of Care (Signed)
Problem: Phase I Progression Outcomes Goal: Initial discharge plan identified Outcome: Completed/Met Date Met:  02/18/13 To return home

## 2013-02-18 NOTE — Care Management Note (Signed)
    Page 1 of 2   02/19/2013     11:08:09 AM   CARE MANAGEMENT NOTE 02/19/2013  Patient:  [name] Elliott, [name] Elliott   Account Number:  [id number]  Date Initiated:  02/18/2013  Documentation initiated by:  Letha Cape  Subjective/Objective Assessment:   dx lumbar spondylosis  admit-lives alone.     Action/Plan:   pt eval-recs hhpt.   Anticipated DC Date:  02/19/2013   Anticipated DC Plan:  HOME W HOME HEALTH SERVICES      DC Planning Services  CM consult      [facility] Choice  HOME HEALTH   Choice offered to / List presented to:  C-4 Adult Children        HH arranged  HH-2 PT  HH-3 OT      [facility] agency  [facility]   Status of service:  Completed, signed off Medicare Important Message given?   (If response is "NO", the following Medicare IM given date fields will be blank) Date Medicare IM given:   Date Additional Medicare IM given:    Discharge Disposition:  HOME W HOME HEALTH SERVICES  Per UR Regulation:  Reviewed for med. necessity/level of care/duration of stay  If discussed at Long Length of Stay Meetings, dates discussed:    Comments:  02/19/13 11:07 Letha Cape RN, BSN [phone] patient dc to home , [facility] notified.  02/18/13 15:11 Letha Cape RN, BSN [phone] patient lives alone, uses a cane.  Per physical therapy recs hhpt/hhot, spoke with patient's daughter, Agustin Cree phone [phone], she chose [facility] for [facility] services.  Referral made to [facility] , Lupita Leash notified for hhpt/hhot.  Soc will begin 24-48 hrs post discharge.  NCM will continue to follow for dc needs.

## 2013-02-18 NOTE — Evaluation (Signed)
Physical Therapy Evaluation Patient Details Name: [name] Elliott MRN: [id number] DOB: 1927/12/01 Today's Date: 02/18/2013 Time: 4098-1191 PT Time Calculation (min): 13 min  PT Assessment / Plan / Recommendation Clinical Impression    Pt admitted with bil leg pain. Pt currently with functional limitations due to the deficits listed below (see PT Problem List).  Pt will benefit from skilled PT to increase their independence and safety with mobility to allow discharge home with HHPT.      PT Assessment  Patient needs continued PT services    Follow Up Recommendations  Home health PT    Does the patient have the potential to tolerate intense rehabilitation      Barriers to Discharge        Equipment Recommendations  None recommended by PT    Recommendations for Other Services     Frequency Min 3X/week    Precautions / Restrictions Precautions Precautions: None   Pertinent Vitals/Pain Pain in bil lower legs 7/10. Repositioned.      Mobility  Bed Mobility Bed Mobility: Supine to Sit;Sitting - Scoot to Edge of Bed Supine to Sit: 6: Modified independent (Device/Increase time);HOB elevated Sitting - Scoot to Edge of Bed: 7: Independent Transfers Transfers: Sit to Stand;Stand to Sit Sit to Stand: 5: Supervision;With upper extremity assist;From bed Stand to Sit: 5: Supervision;With upper extremity assist;With armrests;To chair/3-in-1 Ambulation/Gait Ambulation/Gait Assistance: 5: Supervision Ambulation Distance (Feet): 150 Feet Assistive device: Straight cane Gait Pattern: Step-through pattern;Decreased stride length Gait velocity: decr    Exercises     PT Diagnosis: Difficulty walking  PT Problem List: Decreased mobility;Pain PT Treatment Interventions: DME instruction;Gait training;Patient/family education;Functional mobility training;Therapeutic activities   PT Goals Acute Rehab PT Goals PT Goal Formulation: With patient Time For Goal Achievement:  02/25/13 Potential to Achieve Goals: Good Pt will go Sit to Stand: with modified independence PT Goal: Sit to Stand - Progress: Goal set today Pt will go Stand to Sit: with modified independence PT Goal: Stand to Sit - Progress: Goal set today Pt will Ambulate: 51 - 150 feet;with modified independence;with least restrictive assistive device PT Goal: Ambulate - Progress: Goal set today  Visit Information  Last PT Received On: 02/18/13 Assistance Needed: +1    Subjective Data  Patient Stated Goal: Decr leg pain   Prior Functioning  Home Living Lives With: Alone Type of Home: House Home Access: Stairs to enter Secretary/administrator of Steps: 1 Home Layout: One level Home Adaptive Equipment: Straight cane Additional Comments: Just began using cane since leg pain began. Prior Function Level of Independence: Independent with assistive device(s) Vocation: Retired Musician: No difficulties    Copywriter, advertising Arousal/Alertness: Awake/alert Behavior During Therapy: WFL for tasks assessed/performed Overall Cognitive Status: Within Functional Limits for tasks assessed    Extremity/Trunk Assessment Right Lower Extremity Assessment RLE ROM/Strength/Tone: [facility] for tasks assessed Left Lower Extremity Assessment LLE ROM/Strength/Tone: WFL for tasks assessed   Balance Balance Balance Assessed: Yes Static Standing Balance Static Standing - Balance Support: Right upper extremity supported Static Standing - Level of Assistance: 5: Stand by assistance  End of Session PT - End of Session Equipment Utilized During Treatment: Gait belt Activity Tolerance: Patient tolerated treatment well Patient left: in chair;with call bell/phone within reach Nurse Communication: Mobility status  GP     [facility] 02/18/2013, 12:36 PM  [employer] PT [phone]

## 2013-02-19 LAB — BASIC METABOLIC PANEL
BUN: 30 mg/dL — ABNORMAL HIGH (ref 6–23)
Creatinine, Ser: 1.12 mg/dL (ref 0.50–1.35)
GFR calc Af Amer: 69 mL/min — ABNORMAL LOW (ref >90)
GFR calc non Af Amer: 59 mL/min — ABNORMAL LOW (ref >90)
Glucose, Bld: 227 mg/dL — ABNORMAL HIGH (ref 70–99)
Potassium: 3.7 mEq/L (ref 3.5–5.1)

## 2013-02-19 MED ORDER — AMITRIPTYLINE HCL 25 MG PO TABS: 25.0000 mg | ORAL_TABLET | Freq: Every day | ORAL | Status: DC

## 2013-02-19 MED ORDER — AMITRIPTYLINE HCL 25 MG PO TABS
25.0000 mg | ORAL_TABLET | Freq: Every day | ORAL | Status: DC
  Filled 2013-02-19: qty 1

## 2013-02-19 NOTE — Discharge Summary (Signed)
Physician Discharge Summary  NAME:[name] Elliott  ZOX:[id number]  DOB: April 14, 1928   Admit date: 02/17/2013 Discharge date: 02/19/2013  Discharge Diagnoses:  Active Problems: Bilateral lower extremity pain - almost certainly secondary to peripheral neuropathy.  Pain is palpable without erythema or swelling in her neurosurgery evaluation per Dr. Franky Macho, almost certainly not radicular.  He is already on Neurontin and we'll add Elavil at bedtime and continue pain medications as per prior to hospitalization.  Continue home PT and followup with Dr. Debby Bud next week   Discharge Physical Exam:  Gen'l- Elderly white man in no distress lying still, alert  Weight change:   Intake/Output Summary (Last 24 hours) at 02/19/13 0640 Last data filed at 02/19/13 0528  Gross per 24 hour  Intake 2003.75 ml  Output   1835 ml  Net 168.75 ml   Filed Vitals:   02/18/13 1432 02/18/13 2122 02/18/13 2127 02/19/13 0528  BP: 99/60 114/55  115/65  Pulse: 77 45 50 58  Temp: 97.5 F (36.4 C) 98.1 F (36.7 C)  97.8 F (36.6 C)  TempSrc: Oral Oral  Oral  Resp: 18 18  16   Height:      Weight:      SpO2: 95% 94%  96%    HEENT- C&S clear  Cor 2+ radial pulse, RRR  Pulm - normal respirations  Abd- soft Extremities - tender to palpation over knees legs and feet bilaterally without evidence of erythema or significant swelling or deformity.  He does have hypertrophic knees bilaterally  Neuro - able to lift each leg 10-15 degrees off the bed.  Hyperaesthetic over knees legs and feet  Discharge Condition: Slightly improved  Hospital Course: 79 year old male with coronary artery disease, hypercholesterolemia, hypothyroidism and B12 deficiency.  He also has a history of DJD.  He has been suffering from bilateral leg pain and finding it hard to walk more than about 50 feet.  Concern was for lumbar radiculopathy but MRI performed during this hospitalization and neurosurgical evaluation by Dr. Coletta Memos are not  suggestive of lumbar radiculopathy and he is likely having a severe peripheral neuropathy.  We'll be adding Elavil to his Neurontin and continuing home physical therapy.  Will followup with Dr. Illene Regulus next week.  Things to follow up in the outpatient setting: Leg pain and strength and ability to ambulate  Consults:  Neurosurgery - Dr. Coletta Memos                    Physical therapy  Disposition: Will discharge home with home health and physical therapy and followup with Dr. Illene Regulus in one week  Discharge Orders   Future Orders Complete By Expires     Call MD for:  difficulty breathing, headache or visual disturbances  As directed     Call MD for:  severe uncontrolled pain  As directed     Call MD for:  temperature >100.4  As directed     Diet - low sodium heart healthy  As directed     Increase activity slowly  As directed         Medication List    TAKE these medications       acetaminophen 500 MG tablet  Commonly known as:  TYLENOL  Take 1,000 mg by mouth every 6 (six) hours as needed for pain.     amitriptyline 25 MG tablet  Commonly known as:  ELAVIL  Take 1 tablet (25 mg total) by mouth at bedtime.  aspirin EC 81 MG tablet  Take 81 mg by mouth daily.     calcium carbonate 600 MG Tabs  Commonly known as:  OS-CAL  Take 600 mg by mouth daily.     cholecalciferol 1000 UNITS tablet  Commonly known as:  VITAMIN D  Take 1,000 Units by mouth daily.     furosemide 40 MG tablet  Commonly known as:  LASIX  Take 40 mg by mouth 2 (two) times daily.     gabapentin 300 MG capsule  Commonly known as:  NEURONTIN  Take 300 mg by mouth 3 (three) times daily.     GLUCOSAMINE PO  Take 1 tablet by mouth daily.     HYDROcodone-acetaminophen 5-325 MG per tablet  Commonly known as:  NORCO  Take 1 tablet by mouth every 6 (six) hours as needed for pain.     isosorbide mononitrate 60 MG 24 hr tablet  Commonly known as:  IMDUR  Take 60 mg by mouth daily.      nitroGLYCERIN 0.4 MG SL tablet  Commonly known as:  NITROSTAT  Place 0.4 mg under the tongue every 5 (five) minutes as needed for chest pain.     omeprazole 40 MG capsule  Commonly known as:  PRILOSEC  Take 1 capsule (40 mg total) by mouth daily.     OxyCODONE HCl (Abuse Deter) 5 MG Taba  Take 1 tablet by mouth 4 (four) times daily as needed.     potassium chloride SA 20 MEQ tablet  Commonly known as:  K-DUR,KLOR-CON  Take 20 mEq by mouth 2 (two) times daily.     tiZANidine 4 MG tablet  Commonly known as:  ZANAFLEX  Take 1 tablet (4 mg total) by mouth every 6 (six) hours as needed.     traMADol 50 MG tablet  Commonly known as:  ULTRAM  Take 1 tablet (50 mg total) by mouth every 8 (eight) hours as needed for pain.         The results of significant diagnostics from this hospitalization (including imaging, microbiology, ancillary and laboratory) are listed below for reference.    Significant Diagnostic Studies: Dg Knee 1-2 Views Left  03-07-13   *RADIOLOGY REPORT*  Clinical Data: Severe knee pain  LEFT KNEE - 1-2 VIEW  Comparison: None.  Findings: Degenerative changes are noted most marked in the medial joint space.  A small joint effusion is identified.  Diffuse vascular calcifications are noted as well as meniscal calcifications.  IMPRESSION: Degenerative change with mild joint effusion.   Original Report Authenticated By: Alcide Clever, M.D.   Dg Knee 1-2 Views Right  03/07/13   *RADIOLOGY REPORT*  Clinical Data: Right knee pain  RIGHT KNEE - 1-2 VIEW  Comparison: None.  Findings: Degenerative changes are noted similar to that seen on the left.  No sizable joint effusion is identified.  Diffuse vascular meniscal calcifications are seen.  IMPRESSION: Degenerative changes without acute abnormality.   Original Report Authenticated By: Alcide Clever, M.D.   Mr Lumbar Spine Wo Contrast  Mar 07, 2013   *RADIOLOGY REPORT*  Clinical Data: 79 year old male with right flank pain radiating  to the right lower extremity.  MRI LUMBAR SPINE WITHOUT CONTRAST  Technique:  Multiplanar and multiecho pulse sequences of the lumbar spine were obtained without intravenous contrast.  Comparison: CT abdomen and pelvis 11/15/2011.  Findings: Normal lumbar segmentation depicted on comparison. Chronic severe T11 compression fracture, stable. No marrow edema or evidence of acute osseous abnormality.  The bladder is distended.  Visible bilateral renal parenchyma without definite hydronephrosis.  Diverticulosis of the colon.  Visible lower thoracic spinal cord remarkable for spinal stenosis related to the T11 compression fracture (mild retropulsion of bone. No lower thoracic spinal cord definite compression or signal abnormality.  Normal appearance of the conus at L1.  T10-T11:  Spinal and foraminal stenosis related to the T11 compression fracture and bilateral facet hypertrophy.  The spinal cord discussion above.  T11-T12:  Mild spinal stenosis related to the T11 compression fracture.  Moderate facet hypertrophy.  Mild to moderate bilateral T11 foraminal stenosis.  T12-L1:  Negative.  L1-L2:  The right eccentric circumferential disc osteophyte complex.  Mild right lateral recess stenosis.  No significant spinal or foraminal stenosis.  L2-L3:  Right eccentric circumferential disc osteophyte complex. Mild facet and ligament flavum hypertrophy greater on the right. No spinal stenosis.  No significant lateral recess stenosis.  Mild right L2 foraminal stenosis.  L3-L4:  Right eccentric circumferential disc osteophyte complex. Mild facet hypertrophy.  No spinal or significant lateral recess stenosis.  Mild left and mild to moderate right L3 foraminal stenosis.  L4-L5:  Severe chronic disc space loss.  Circumferential disc osteophyte complex.  Previous decompression of this level.  No spinal or lateral recess stenosis.  Moderate left and mild right L4 foraminal stenosis.  L5-S1:  Predominately far lateral bulky circumferential  disc osteophyte complex.  Mild to moderate facet hypertrophy greater on the left.  No spinal or lateral recess stenosis.  Moderate to severe left and mild to moderate right L5 foraminal stenosis.  IMPRESSION: 1.  Chronic-appearing lower thoracic spinal stenosis primarily related to a chronic compression fracture T11 with mild retropulsion.  No definite lower spinal cord compression or signal abnormality.  Normal conus. 2.  No lumbar spinal stenosis.  There is multilevel lumbar neural foraminal stenosis primarily related to disc and endplate degeneration. 3.  Distended bladder.  Recommend Foley catheter placement. 4.  Diverticulosis of the colon.   Original Report Authenticated By: Erskine Speed, M.D.    Microbiology: No results found for this or any previous visit (from the past 240 hour(s)).   Labs: Results for orders placed during the hospital encounter of 02/17/13  COMPREHENSIVE METABOLIC PANEL      Result Value Range   Sodium 130 (*) 135 - 145 mEq/L   Potassium 4.2  3.5 - 5.1 mEq/L   Chloride 95 (*) 96 - 112 mEq/L   CO2 25  19 - 32 mEq/L   Glucose, Bld 159 (*) 70 - 99 mg/dL   BUN 26 (*) 6 - 23 mg/dL   Creatinine, Ser 1.61 (*) 0.50 - 1.35 mg/dL   Calcium 9.0  8.4 - 09.6 mg/dL   Total Protein 6.9  6.0 - 8.3 g/dL   Albumin 3.4 (*) 3.5 - 5.2 g/dL   AST 18  0 - 37 U/L   ALT 9  0 - 53 U/L   Alkaline Phosphatase 46  39 - 117 U/L   Total Bilirubin 1.1  0.3 - 1.2 mg/dL   GFR calc non Af Amer 41 (*) >90 mL/min   GFR calc Af Amer 47 (*) >90 mL/min  CBC WITH DIFFERENTIAL      Result Value Range   WBC 10.4  4.0 - 10.5 K/uL   RBC 4.50  4.22 - 5.81 MIL/uL   Hemoglobin 13.8  13.0 - 17.0 g/dL   HCT 04.5  40.9 - 81.1 %   MCV 88.0  78.0 - 100.0 fL   MCH  30.7  26.0 - 34.0 pg   MCHC 34.8  30.0 - 36.0 g/dL   RDW 40.9  81.1 - 91.4 %   Platelets 137 (*) 150 - 400 K/uL   Neutrophils Relative % 72  43 - 77 %   Neutro Abs 7.5  1.7 - 7.7 K/uL   Lymphocytes Relative 16  12 - 46 %   Lymphs Abs 1.7  0.7 -  4.0 K/uL   Monocytes Relative 12  3 - 12 %   Monocytes Absolute 1.3 (*) 0.1 - 1.0 K/uL   Eosinophils Relative 0  0 - 5 %   Eosinophils Absolute 0.0  0.0 - 0.7 K/uL   Basophils Relative 0  0 - 1 %   Basophils Absolute 0.0  0.0 - 0.1 K/uL    Time coordinating discharge: 35 minutes  Signed: Pearla Dubonnet, MD 02/19/2013, 6:40 AM

## 2013-02-19 NOTE — Progress Notes (Signed)
[name] Elliott to be D/C'd Home with [facility] per MD order.  Discussed with the patient and all questions fully answered.    Medication List    TAKE these medications       acetaminophen 500 MG tablet  Commonly known as:  TYLENOL  Take 1,000 mg by mouth every 6 (six) hours as needed for pain.     amitriptyline 25 MG tablet  Commonly known as:  ELAVIL  Take 1 tablet (25 mg total) by mouth at bedtime.     aspirin EC 81 MG tablet  Take 81 mg by mouth daily.     calcium carbonate 600 MG Tabs  Commonly known as:  OS-CAL  Take 600 mg by mouth daily.     cholecalciferol 1000 UNITS tablet  Commonly known as:  VITAMIN D  Take 1,000 Units by mouth daily.     furosemide 40 MG tablet  Commonly known as:  LASIX  Take 40 mg by mouth 2 (two) times daily.     gabapentin 300 MG capsule  Commonly known as:  NEURONTIN  Take 300 mg by mouth 3 (three) times daily.     GLUCOSAMINE PO  Take 1 tablet by mouth daily.     HYDROcodone-acetaminophen 5-325 MG per tablet  Commonly known as:  NORCO  Take 1 tablet by mouth every 6 (six) hours as needed for pain.     isosorbide mononitrate 60 MG 24 hr tablet  Commonly known as:  IMDUR  Take 60 mg by mouth daily.     nitroGLYCERIN 0.4 MG SL tablet  Commonly known as:  NITROSTAT  Place 0.4 mg under the tongue every 5 (five) minutes as needed for chest pain.     omeprazole 40 MG capsule  Commonly known as:  PRILOSEC  Take 1 capsule (40 mg total) by mouth daily.     OxyCODONE HCl (Abuse Deter) 5 MG Taba  Take 1 tablet by mouth 4 (four) times daily as needed.     potassium chloride SA 20 MEQ tablet  Commonly known as:  K-DUR,KLOR-CON  Take 20 mEq by mouth 2 (two) times daily.     tiZANidine 4 MG tablet  Commonly known as:  ZANAFLEX  Take 1 tablet (4 mg total) by mouth every 6 (six) hours as needed.     traMADol 50 MG tablet  Commonly known as:  ULTRAM  Take 1 tablet (50 mg total) by mouth every 8 (eight) hours as needed for pain.         VVS, Skin clean, dry and intact without evidence of skin break down, no evidence of skin tears noted. IV catheter discontinued intact. Site without signs and symptoms of complications. Dressing and pressure applied.  An After Visit Summary was printed and given to the patient. Follow up appointments , new prescriptions and medication administration times given. Patient escorted via WC, and D/C home via private auto.  Cindra Eves, RN 02/19/2013 11:58 AM

## 2013-02-24 ENCOUNTER — Telehealth: Payer: Self-pay

## 2013-02-24 NOTE — Telephone Encounter (Signed)
Phone call from [place] at [facility] [phone]. She states she has not been able to evaluate patient. His wife doesn't want her to do much and she does not know what to do. She says patient may be coming back in to see you.

## 2013-02-24 NOTE — Telephone Encounter (Signed)
Ann called again.  His wife says he is too much pain for physical therapy.  He has an appt on Thursday with Dr. Debby Bud.  Please contact [facility] after the appt to advise [name] Elliott what to do about the Physical Therapy order. Her office number is [phone] and her cell is [phone].

## 2013-02-25 ENCOUNTER — Ambulatory Visit: Payer: Self-pay | Admitting: Internal Medicine

## 2013-02-25 NOTE — Telephone Encounter (Signed)
Hold or d/c PT order for now

## 2013-02-26 ENCOUNTER — Encounter: Payer: Self-pay | Admitting: Internal Medicine

## 2013-02-26 ENCOUNTER — Ambulatory Visit (INDEPENDENT_AMBULATORY_CARE_PROVIDER_SITE_OTHER): Payer: Medicare Other | Admitting: Internal Medicine

## 2013-02-26 VITALS — BP 112/54 | HR 67 | Temp 97.1°F | Wt 161.0 lb

## 2013-02-26 DIAGNOSIS — M25562 Pain in left knee: Secondary | ICD-10-CM

## 2013-02-26 DIAGNOSIS — G609 Hereditary and idiopathic neuropathy, unspecified: Secondary | ICD-10-CM

## 2013-02-26 DIAGNOSIS — M25569 Pain in unspecified knee: Secondary | ICD-10-CM

## 2013-02-26 MED ORDER — AMITRIPTYLINE HCL 50 MG PO TABS: 50.0000 mg | ORAL_TABLET | Freq: Every day | ORAL | Status: DC

## 2013-02-26 NOTE — Telephone Encounter (Signed)
Called to inform Dewayne Hatch at [facility].

## 2013-02-26 NOTE — Patient Instructions (Addendum)
Leg pain - after MRI and consultation it is a problem of peripheral neuropathy. Not a surgical problem.  Plan Continue gabapentin 3 times a day  Continue oxycodone 5 mg 4 times as needed  Increase Elavil to 50 mg at bedtime  Knee pain - xrays - some preserved joint space but advance arthritis.  Plan  Continue glucosamine  Continue oxycodone 4 times a day  Try to work with PT  See Dr. Dion Saucier- he is the expert and can tell how advanced the knees are; he may be able to do hyaluronidase (synvsc) injections to help.  OK to take lasix just once a day.

## 2013-02-27 ENCOUNTER — Telehealth: Payer: Self-pay

## 2013-02-27 NOTE — Telephone Encounter (Signed)
Phone call from patient's daughter stating Amitriptyline needs prior authorization. I let her know I did receive the form from [facility] and will submit the prior authorization today.

## 2013-03-01 DIAGNOSIS — M25561 Pain in right knee: Secondary | ICD-10-CM | POA: Insufficient documentation

## 2013-03-01 NOTE — Progress Notes (Signed)
  Subjective:    Patient ID: [name] Elliott, male    DOB: 1928-05-30, 79 y.o.   MRN: [id number]  HPI [name] Elliott presents for hospital follow up. [name] Elliott was hospitalize 6/17-19/14: Active Problems:  Bilateral lower extremity pain - almost certainly secondary to peripheral neuropathy. Pain is palpable without erythema or swelling in her neurosurgery evaluation per Dr. Franky Macho, almost certainly not radicular. [name] Elliott is already on Neurontin and we'll add Elavil at bedtime and continue pain medications as per prior to hospitalization.   During his in-patient evaluation [name] Elliott had MRI spine and neurosurgical consultation.  Since discharge [name] Elliott has been doing better. [name] Elliott is taking medication as instructed. [name] Elliott is more ambulatory and has been able to sleep. [name] Elliott does c/o burning dysesthesia of feet at night.  PMH, FamHx and SocHx reviewed for any changes and relevance. Current Outpatient Prescriptions on File Prior to Visit  Medication Sig Dispense Refill  . acetaminophen (TYLENOL) 500 MG tablet Take 1,000 mg by mouth every 6 (six) hours as needed for pain.      Marland Kitchen aspirin EC 81 MG tablet Take 81 mg by mouth daily.      . calcium carbonate (OS-CAL) 600 MG TABS Take 600 mg by mouth daily.      . cholecalciferol (VITAMIN D) 1000 UNITS tablet Take 1,000 Units by mouth daily.      . furosemide (LASIX) 40 MG tablet Take 40 mg by mouth 2 (two) times daily.      Marland Kitchen gabapentin (NEURONTIN) 300 MG capsule Take 300 mg by mouth 3 (three) times daily.      Marland Kitchen GLUCOSAMINE PO Take 1 tablet by mouth daily.      . isosorbide mononitrate (IMDUR) 60 MG 24 hr tablet Take 60 mg by mouth daily.      . nitroGLYCERIN (NITROSTAT) 0.4 MG SL tablet Place 0.4 mg under the tongue every 5 (five) minutes as needed for chest pain.      Marland Kitchen omeprazole (PRILOSEC) 40 MG capsule Take 1 capsule (40 mg total) by mouth daily.  90 capsule  3  . OxyCODONE HCl, Abuse Deter, 5 MG TABA Take 1 tablet by mouth 4 (four) times daily as needed.  60 tablet  0  .  potassium chloride SA (K-DUR,KLOR-CON) 20 MEQ tablet Take 20 mEq by mouth 2 (two) times daily.      Marland Kitchen tiZANidine (ZANAFLEX) 4 MG tablet Take 1 tablet (4 mg total) by mouth every 6 (six) hours as needed.  40 tablet  0  . [DISCONTINUED] Hyoscyamine Sulfate (HYOMAX-DT) 0.375 MG TBCR Take 1 tab bid for 5 days, then prn abd pain  30 each  1   No current facility-administered medications on file prior to visit.         Review of Systems System review is negative for any constitutional, cardiac, pulmonary, GI or neuro symptoms or complaints other than as described in the HPI.     Objective:   Physical Exam Filed Vitals:   02/26/13 1624  BP: 112/54  Pulse: 67  Temp: 97.1 F (36.2 C)   gen'l  - elderly white man in no distress Cor- RRR Pulm - normal Neuro - alert, oriented, cognition at baseline. Able to walk using cane for assist.       Assessment & Plan:

## 2013-03-01 NOTE — Assessment & Plan Note (Signed)
February 17, 2013 LEFT KNEE - 1-2 VIEW  Comparison: None.  Findings: Degenerative changes are noted most marked in the medial  joint space. A small joint effusion is identified. Diffuse  vascular calcifications are noted as well as meniscal  calcifications.  IMPRESSION:  Degenerative change with mild joint effusion.  RIGHT KNEE - 1-2 VIEW  Comparison: None.  Findings: Degenerative changes are noted similar to that seen on  the left. No sizable joint effusion is identified. Diffuse  vascular meniscal calcifications are seen.  IMPRESSION:  Degenerative changes without acute abnormality.  Plan Recommend seeing Dr. Dion Saucier for possible help with hyaluronidase injections if appropriate.

## 2013-03-01 NOTE — Assessment & Plan Note (Signed)
Full evaluation including MRI and NS consult ruled out radiculopathy. He has responded to treatment for peripheral neuropathy  Plan Continue present medications but increase elavil to 50 mg qHS.

## 2013-03-18 ENCOUNTER — Telehealth: Payer: Self-pay | Admitting: Internal Medicine

## 2013-03-18 NOTE — Telephone Encounter (Signed)
rec'd records from [facility] 2 pages. Forward to Dr.Norins

## 2013-03-21 ENCOUNTER — Ambulatory Visit (INDEPENDENT_AMBULATORY_CARE_PROVIDER_SITE_OTHER): Payer: Medicare Other | Admitting: Family Medicine

## 2013-03-21 ENCOUNTER — Encounter: Payer: Self-pay | Admitting: Family Medicine

## 2013-03-21 VITALS — BP 124/70 | HR 85 | Temp 98.0°F | Ht 66.0 in

## 2013-03-21 DIAGNOSIS — M79609 Pain in unspecified limb: Secondary | ICD-10-CM

## 2013-03-21 DIAGNOSIS — M79604 Pain in right leg: Secondary | ICD-10-CM

## 2013-03-21 MED ORDER — OXYCODONE HCL 5 MG PO TABS: ORAL_TABLET | ORAL | Status: DC

## 2013-03-21 NOTE — Patient Instructions (Addendum)
If getting back on the oxycodone does not lead to your previous level of functioning then return to see your MD.

## 2013-03-21 NOTE — Progress Notes (Signed)
OFFICE NOTE  03/21/2013  CC:  Chief Complaint  Patient presents with  . Leg Pain    both legs X started on Wed     HPI: Patient is a 79 y.o. Caucasian male who is here with daughter for c/o for leg pains. Describes chronic bilateral lower leg pains, some is osteoarthritis pain in knees per hx and some is lower leg/feet neuropathic pain per hx. He says he ran out of his pain pills (oxycodone) several days ago and ever since then he has had return of severe pain and has been practically unable to walk. No change in appearance of knees or lower legs per pt and daughter.   Last oxycodone rx appears to have been in early June for 60 pills (5mg ).  He says he would usually take 1-2 per day to make the pain much more bearable.   Pertinent PMH:  Past Medical History  Diagnosis Date  . Constipation, chronic   . Sebaceous cyst   . Other malaise and fatigue   . Pain in joint, shoulder region     Has chronic shoulder pain  . Persistent disorder of initiating or maintaining sleep   . Thrombocytopenia, unspecified   . Dementia     with periods of amnesia  . Other B-complex deficiencies   . Hypothyroidism   . Pain in limb   . Esophageal reflux   . Unspecified essential hypertension   . Arthropathy, unspecified, site unspecified   . Pure hypercholesterolemia   . Orthostasis   . PVC's (premature ventricular contractions)   . S/P cardiac cath 08/08/11    mild to moderate CAD primarily in the LAD. None are obstructive and appear stable from prior cath in 2007; managed medically  . Arthritis   . CAD (coronary artery disease)     last cath in 2012. Managed medically-some blockages  . Headache(784.0)   . 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 MRI Nov '11 - tendonosis and partial tear. Left shoulder surgery Jan '12 for partial shoulder replacement. [name])   . Blood dyscrasia     thromboctopenia  . Pneumonia 1980's?; 2011  . History of stomach ulcers 11/2012  .  Chronic lower back pain     MEDS:  Outpatient Prescriptions Prior to Visit  Medication Sig Dispense Refill  . acetaminophen (TYLENOL) 500 MG tablet Take 1,000 mg by mouth every 6 (six) hours as needed for pain.      Marland Kitchen amitriptyline (ELAVIL) 50 MG tablet Take 1 tablet (50 mg total) by mouth at bedtime.  30 tablet  5  . aspirin EC 81 MG tablet Take 81 mg by mouth daily.      . calcium carbonate (OS-CAL) 600 MG TABS Take 600 mg by mouth daily.      . cholecalciferol (VITAMIN D) 1000 UNITS tablet Take 1,000 Units by mouth daily.      . furosemide (LASIX) 40 MG tablet Take 40 mg by mouth 2 (two) times daily.      Marland Kitchen gabapentin (NEURONTIN) 300 MG capsule Take 300 mg by mouth 3 (three) times daily.      Marland Kitchen GLUCOSAMINE PO Take 1 tablet by mouth daily.      . isosorbide mononitrate (IMDUR) 60 MG 24 hr tablet Take 60 mg by mouth daily.      . nitroGLYCERIN (NITROSTAT) 0.4 MG SL tablet Place 0.4 mg under the tongue every 5 (five) minutes as needed for chest pain.      Marland Kitchen omeprazole (  PRILOSEC) 40 MG capsule Take 1 capsule (40 mg total) by mouth daily.  90 capsule  3  . OxyCODONE HCl, Abuse Deter, 5 MG TABA Take 1 tablet by mouth 4 (four) times daily as needed.  60 tablet  0  . potassium chloride SA (K-DUR,KLOR-CON) 20 MEQ tablet Take 20 mEq by mouth 2 (two) times daily.      Marland Kitchen tiZANidine (ZANAFLEX) 4 MG tablet Take 1 tablet (4 mg total) by mouth every 6 (six) hours as needed.  40 tablet  0   No facility-administered medications prior to visit.    PE: Blood pressure 124/70, pulse 85, temperature 98 F (36.7 C), temperature source Oral, height 5\' 6"  (1.676 m), weight 0 lb (0 kg), SpO2 95.00%. Gen: Alert, well appearing, sitting in wheelchair.  Patient is oriented to person, place, time, and situation. Palpation of both lower extremities from the knees down into feet elicits a mild tenderness.  He has scattered varicosities on both legs, minimal pitting edema on both legs, and scattered post-inflamm  hyperpigmentation spots.  DP pulses 2+ bilat.  Cap refill about 2 sec.   IMPRESSION AND PLAN:  Bilat LE pains, chronic:  Appears to be a combo of osteoarthritic pain + neuropathic pain, and he seems to have simply run out of the pain med that was helping make him more comfortable/functional. Discussed this with pt and his daughter today and decided to rx oxycodone 5mg , 1-2 q6h prn pain, #60, no RF. I advised them that if this med does not allow him the same relief as before and get him back to his level of functioning that he was about 4-5 d/a, then he should return for recheck.

## 2013-03-22 ENCOUNTER — Encounter (HOSPITAL_COMMUNITY): Payer: Self-pay | Admitting: Emergency Medicine

## 2013-03-22 ENCOUNTER — Observation Stay (HOSPITAL_COMMUNITY): Payer: Medicare Other

## 2013-03-22 ENCOUNTER — Observation Stay (HOSPITAL_COMMUNITY)
Admission: EM | Admit: 2013-03-22 | Discharge: 2013-03-24 | Disposition: A | Discharge status: Home / Self Care | Payer: Medicare Other | Attending: Internal Medicine | Admitting: Internal Medicine

## 2013-03-22 DIAGNOSIS — M79609 Pain in unspecified limb: Secondary | ICD-10-CM

## 2013-03-22 DIAGNOSIS — M25579 Pain in unspecified ankle and joints of unspecified foot: Secondary | ICD-10-CM | POA: Insufficient documentation

## 2013-03-22 DIAGNOSIS — M79604 Pain in right leg: Secondary | ICD-10-CM

## 2013-03-22 DIAGNOSIS — I2510 Atherosclerotic heart disease of native coronary artery without angina pectoris: Secondary | ICD-10-CM | POA: Insufficient documentation

## 2013-03-22 DIAGNOSIS — E039 Hypothyroidism, unspecified: Secondary | ICD-10-CM

## 2013-03-22 DIAGNOSIS — M7989 Other specified soft tissue disorders: Secondary | ICD-10-CM

## 2013-03-22 DIAGNOSIS — G609 Hereditary and idiopathic neuropathy, unspecified: Secondary | ICD-10-CM | POA: Insufficient documentation

## 2013-03-22 DIAGNOSIS — Z79899 Other long term (current) drug therapy: Secondary | ICD-10-CM | POA: Insufficient documentation

## 2013-03-22 DIAGNOSIS — M129 Arthropathy, unspecified: Secondary | ICD-10-CM

## 2013-03-22 DIAGNOSIS — E7800 Pure hypercholesterolemia, unspecified: Secondary | ICD-10-CM | POA: Insufficient documentation

## 2013-03-22 DIAGNOSIS — M25569 Pain in unspecified knee: Principal | ICD-10-CM | POA: Insufficient documentation

## 2013-03-22 DIAGNOSIS — M1120 Other chondrocalcinosis, unspecified site: Secondary | ICD-10-CM | POA: Insufficient documentation

## 2013-03-22 DIAGNOSIS — Z7982 Long term (current) use of aspirin: Secondary | ICD-10-CM | POA: Insufficient documentation

## 2013-03-22 DIAGNOSIS — M25562 Pain in left knee: Secondary | ICD-10-CM

## 2013-03-22 DIAGNOSIS — M1710 Unilateral primary osteoarthritis, unspecified knee: Secondary | ICD-10-CM | POA: Insufficient documentation

## 2013-03-22 DIAGNOSIS — M25473 Effusion, unspecified ankle: Secondary | ICD-10-CM | POA: Insufficient documentation

## 2013-03-22 DIAGNOSIS — M79605 Pain in left leg: Secondary | ICD-10-CM

## 2013-03-22 DIAGNOSIS — R52 Pain, unspecified: Secondary | ICD-10-CM

## 2013-03-22 DIAGNOSIS — M25476 Effusion, unspecified foot: Secondary | ICD-10-CM | POA: Insufficient documentation

## 2013-03-22 DIAGNOSIS — I10 Essential (primary) hypertension: Secondary | ICD-10-CM

## 2013-03-22 DIAGNOSIS — F068 Other specified mental disorders due to known physiological condition: Secondary | ICD-10-CM

## 2013-03-22 DIAGNOSIS — R269 Unspecified abnormalities of gait and mobility: Secondary | ICD-10-CM

## 2013-03-22 LAB — BASIC METABOLIC PANEL
BUN: 18 mg/dL (ref 6–23)
Calcium: 9.1 mg/dL (ref 8.4–10.5)
GFR calc Af Amer: 58 mL/min — ABNORMAL LOW (ref >90)
GFR calc non Af Amer: 50 mL/min — ABNORMAL LOW (ref >90)
Glucose, Bld: 142 mg/dL — ABNORMAL HIGH (ref 70–99)

## 2013-03-22 LAB — CBC
HCT: 45.6 % (ref 39.0–52.0)
Hemoglobin: 15.2 g/dL (ref 13.0–17.0)
MCV: 89.8 fL (ref 78.0–100.0)
RBC: 5.08 MIL/uL (ref 4.22–5.81)
WBC: 10.7 10*3/uL — ABNORMAL HIGH (ref 4.0–10.5)

## 2013-03-22 LAB — URINALYSIS, ROUTINE W REFLEX MICROSCOPIC
Bilirubin Urine: NEGATIVE
Hgb urine dipstick: NEGATIVE
Ketones, ur: NEGATIVE mg/dL
Nitrite: NEGATIVE
Protein, ur: NEGATIVE mg/dL
Urobilinogen, UA: 0.2 mg/dL (ref 0.0–1.0)

## 2013-03-22 MED ORDER — TIZANIDINE HCL 4 MG PO TABS
4.0000 mg | ORAL_TABLET | Freq: Four times a day (QID) | ORAL | Status: DC | PRN
  Filled 2013-03-22: qty 1

## 2013-03-22 MED ORDER — SODIUM CHLORIDE 0.9 % IJ SOLN: 3.0000 mL | INTRAMUSCULAR | Status: DC | PRN

## 2013-03-22 MED ORDER — AMITRIPTYLINE HCL 25 MG PO TABS
25.0000 mg | ORAL_TABLET | Freq: Every day | ORAL | Status: DC
  Administered 2013-03-22 – 2013-03-23 (×2): 25 mg via ORAL
  Filled 2013-03-22 (×3): qty 1

## 2013-03-22 MED ORDER — ASPIRIN EC 81 MG PO TBEC
81.0000 mg | DELAYED_RELEASE_TABLET | Freq: Every morning | ORAL | Status: DC
  Administered 2013-03-23 – 2013-03-24 (×2): 81 mg via ORAL
  Filled 2013-03-22 (×2): qty 1

## 2013-03-22 MED ORDER — ENOXAPARIN SODIUM 40 MG/0.4ML ~~LOC~~ SOLN
40.0000 mg | SUBCUTANEOUS | Status: DC
  Administered 2013-03-22 – 2013-03-23 (×2): 40 mg via SUBCUTANEOUS
  Filled 2013-03-22 (×3): qty 0.4

## 2013-03-22 MED ORDER — FUROSEMIDE 40 MG PO TABS
40.0000 mg | ORAL_TABLET | Freq: Two times a day (BID) | ORAL | Status: DC
  Administered 2013-03-22 – 2013-03-24 (×5): 40 mg via ORAL
  Filled 2013-03-22 (×6): qty 1

## 2013-03-22 MED ORDER — POTASSIUM CHLORIDE CRYS ER 20 MEQ PO TBCR
20.0000 meq | EXTENDED_RELEASE_TABLET | Freq: Two times a day (BID) | ORAL | Status: DC
  Administered 2013-03-22 – 2013-03-24 (×4): 20 meq via ORAL
  Filled 2013-03-22 (×5): qty 1

## 2013-03-22 MED ORDER — SODIUM CHLORIDE 0.9 % IJ SOLN
3.0000 mL | Freq: Two times a day (BID) | INTRAMUSCULAR | Status: DC
  Administered 2013-03-23: 3 mL via INTRAVENOUS

## 2013-03-22 MED ORDER — ISOSORBIDE MONONITRATE ER 60 MG PO TB24
60.0000 mg | ORAL_TABLET | Freq: Every morning | ORAL | Status: DC
Start: 2013-03-23 — End: 2013-03-24
  Administered 2013-03-23 – 2013-03-24 (×2): 60 mg via ORAL
  Filled 2013-03-22 (×2): qty 1

## 2013-03-22 MED ORDER — GABAPENTIN 300 MG PO CAPS
300.0000 mg | ORAL_CAPSULE | Freq: Three times a day (TID) | ORAL | Status: DC
  Administered 2013-03-22 – 2013-03-24 (×7): 300 mg via ORAL
  Filled 2013-03-22 (×8): qty 1

## 2013-03-22 MED ORDER — SODIUM CHLORIDE 0.9 % IV SOLN
250.0000 mL | INTRAVENOUS | Status: DC | PRN
  Administered 2013-03-22: 250 mL via INTRAVENOUS

## 2013-03-22 MED ORDER — HYDROMORPHONE HCL PF 1 MG/ML IJ SOLN
1.0000 mg | Freq: Once | INTRAMUSCULAR | Status: AC
  Administered 2013-03-22: 1 mg via INTRAVENOUS
  Filled 2013-03-22: qty 1

## 2013-03-22 MED ORDER — COLCHICINE 0.6 MG PO TABS
0.6000 mg | ORAL_TABLET | Freq: Two times a day (BID) | ORAL | Status: DC
  Administered 2013-03-22 – 2013-03-24 (×4): 0.6 mg via ORAL
  Filled 2013-03-22 (×5): qty 1

## 2013-03-22 MED ORDER — HYDROMORPHONE HCL PF 1 MG/ML IJ SOLN
1.0000 mg | INTRAMUSCULAR | Status: DC | PRN
  Administered 2013-03-22: 1 mg via INTRAVENOUS
  Filled 2013-03-22 (×2): qty 1

## 2013-03-22 MED ORDER — PANTOPRAZOLE SODIUM 40 MG PO TBEC
40.0000 mg | DELAYED_RELEASE_TABLET | Freq: Every day | ORAL | Status: DC
  Administered 2013-03-22 – 2013-03-24 (×3): 40 mg via ORAL
  Filled 2013-03-22 (×2): qty 1

## 2013-03-22 NOTE — Care Management Note (Signed)
    Page 1 of 1   03/22/2013     5:32:13 PM   CARE MANAGEMENT NOTE 03/22/2013  Patient:  [name] Elliott, [name] Elliott   Account Number:  [id number]  Date Initiated:  03/22/2013  Documentation initiated by:  Lanier Clam  Subjective/Objective Assessment:   79 Y/O MALE ADMITTED W/LEG PAIN.ZO:[id number] PAIN.     Action/Plan:   FROM HOME.HAS PCP,PHARMACY.   Anticipated DC Date:  03/23/2013   Anticipated DC Plan:  HOME W HOME HEALTH SERVICES      DC Planning Services  CM consult      Choice offered to / List presented to:             Status of service:  In process, will continue to follow Medicare Important Message given?   (If response is "NO", the following Medicare IM given date fields will be blank) Date Medicare IM given:   Date Additional Medicare IM given:    Discharge Disposition:    Per UR Regulation:    If discussed at Long Length of Stay Meetings, dates discussed:    Comments:  03/22/13 Aunisty Reali RN,BSN NCM [phone] RECEIVED CALL WHILE PATIENT WAS IN ED.CONCERNED IF LTAC CONS APPROPRIATE.NOTED INSURANCE BLUE MEDICARE(WOULD NEED TO BE ON VENT)WOULD RECOMMEND PT/OT CONS IF MD FEEL APPROPRIATE TO ASSIST WITH D/C NEEDS.

## 2013-03-22 NOTE — ED Notes (Signed)
[id number]<AV> Expected date:<BR> Expected time:<BR> Means of arrival:<BR> Comments:<BR> EMS Elderly bilat. L.e. pain

## 2013-03-22 NOTE — Progress Notes (Signed)
VASCULAR LAB PRELIMINARY  PRELIMINARY  PRELIMINARY  PRELIMINARY  Right lower extremity venous Doppler completed.    Preliminary report:  There is no DVT or SVT noted in the right lower extremity.  Nahdia Doucet, RVT 03/22/2013, 2:51 PM

## 2013-03-22 NOTE — ED Notes (Signed)
Per EMS pt comes from home c/o bilat pain in feet. Pt has peripheral neuropathy and over the past couple of weeks his pain has increased.  Per EMS pt sees DR Norrins and wont give him anything stronger due to unstable gait. Pt uses a walker and lives at home alone, pt's daughter does come y daily and checks on him.

## 2013-03-22 NOTE — H&P (Signed)
PCP:   Illene Regulus, MD   Chief Complaint:   leg pain  HPI: 79 year old male with history of CAD status post cardiac cath, hypothyroidism, peripheral neuropathy who was brought to the hospital today for difficulty walking due to leg pain. Patient's daughter at bedside provides a history and as per daughter patient ran out of his pain medications on Wednesday and saw his primary care physician's yesterday. Patient was prescribed his oxycodone for pain and he took a tablet since yesterday but did not have relief from the pain and was unable to walk. Was brought to the hospital for further management. In the ED patient did receive Dilaudid and his pain has improved. He also had ultrasound of the right lower extremity which did not show any DVT. There motor symptoms no nausea vomiting diarrhea, no fever, no dysuria urgency or frequency of urination. No chest pain or shortness of breath  Allergies:   Allergies  Allergen Reactions  . Crestor (Rosuvastatin Calcium) Other (See Comments)    Muscle pain/aches      Past Medical History  Diagnosis Date  . Constipation, chronic   . Sebaceous cyst   . Other malaise and fatigue   . Pain in joint, shoulder region     Has chronic shoulder pain  . Persistent disorder of initiating or maintaining sleep   . Thrombocytopenia, unspecified   . Dementia     with periods of amnesia  . Other B-complex deficiencies   . Hypothyroidism   . Pain in limb   . Esophageal reflux   . Unspecified essential hypertension   . Arthropathy, unspecified, site unspecified   . Pure hypercholesterolemia   . Orthostasis   . PVC's (premature ventricular contractions)   . S/P cardiac cath 08/08/11    mild to moderate CAD primarily in the LAD. None are obstructive and appear stable from prior cath in 2007; managed medically  . Arthritis   . CAD (coronary artery disease)     last cath in 2012. Managed medically-some blockages  . Headache(784.0)   . Failed arthroplasty,  shoulder 01/01/2012    H/o humeral fracture. MRI Nov '11 - tendonosis and partial tear. Left shoulder surgery Jan '12 for partial shoulder replacement. [name])   . Blood dyscrasia     thromboctopenia  . Pneumonia 1980's?; 2011  . History of stomach ulcers 11/2012  . Chronic lower back pain     Past Surgical History  Procedure Laterality Date  . Hand surgery Right     crush injury, right fifth digit contracture, limited  motion  . Lumbar spine surgery  2007    Dr Eloise Harman (Neurosurgeon)  . Knee surgery Left 1991    "did it twice in 1 wk" (02/17/2013)  . Cataract extraction Left 2009  . Shoulder open rotator cuff repair Left 8.30.2011  . US echocardiography  02-28-2010    Est EF 50-55%  . Cardiac catheterization  08/2011  . Shoulder hemi-arthroplasty    . Finger amputation Right     pinky finger  . Hardware removal  02/05/2012    Procedure: HARDWARE REMOVAL;  Surgeon: Eulas Post, MD;  Location: [facility] OR;  Service: Orthopedics;  Laterality: Left;  . Reverse shoulder arthroplasty  02/05/2012    Procedure: REVERSE SHOULDER ARTHROPLASTY;  Surgeon: Eulas Post, MD;  Location: [facility] OR;  Service: Orthopedics;  Laterality: Left;  . Back surgery    . Esophagogastroduodenoscopy N/A 11/19/2012    Procedure: ESOPHAGOGASTRODUODENOSCOPY (EGD);  Surgeon: Beverley Fiedler, MD;  Location:  [facility] ENDOSCOPY;  Service: Gastroenterology;  Laterality: N/A;    Prior to Admission medications   Medication Sig Start Date End Date Taking? Authorizing Provider  amitriptyline (ELAVIL) 25 MG tablet Take 25 mg by mouth at bedtime.   Yes Historical Provider, MD  aspirin EC 81 MG tablet Take 81 mg by mouth every morning.    Yes Historical Provider, MD  calcium carbonate (OS-CAL) 600 MG TABS Take 600 mg by mouth every morning.    Yes Historical Provider, MD  cholecalciferol (VITAMIN D) 1000 UNITS tablet Take 1,000 Units by mouth every morning.    Yes Historical Provider, MD  furosemide (LASIX) 40 MG tablet Take 40 mg by  mouth 2 (two) times daily.   Yes Historical Provider, MD  gabapentin (NEURONTIN) 300 MG capsule Take 300 mg by mouth 3 (three) times daily.   Yes Historical Provider, MD  GLUCOSAMINE PO Take 1 tablet by mouth every morning.    Yes Historical Provider, MD  isosorbide mononitrate (IMDUR) 60 MG 24 hr tablet Take 60 mg by mouth every morning.    Yes Historical Provider, MD  nitroGLYCERIN (NITROSTAT) 0.4 MG SL tablet Place 0.4 mg under the tongue every 5 (five) minutes x 3 doses as needed for chest pain.    Yes Historical Provider, MD  omeprazole (PRILOSEC) 40 MG capsule Take 40 mg by mouth every morning. 11/26/12  Yes Jacques Navy, MD  oxyCODONE (OXY IR/ROXICODONE) 5 MG immediate release tablet Take 5-10 mg by mouth every 6 (six) hours as needed for pain.   Yes Historical Provider, MD  potassium chloride SA (K-DUR,KLOR-CON) 20 MEQ tablet Take 20 mEq by mouth 2 (two) times daily.   Yes Historical Provider, MD  tiZANidine (ZANAFLEX) 4 MG tablet Take 1 tablet (4 mg total) by mouth every 6 (six) hours as needed. 02/06/13  Yes Corwin Levins, MD    Social History:  reports that he quit smoking about 34 years ago. His smoking use included Cigars. His smokeless tobacco use includes Chew. He reports that he does not drink alcohol or use illicit drugs.  Family History  Problem Relation Age of Onset  . Breast cancer Mother   . Cancer Brother     throat  . Diabetes Daughter   . Colon cancer Neg Hx   . Kidney disease Father      All the positives are listed in BOLD  Review of Systems:  HEENT: Headache, blurred vision, runny nose, sore throat Neck: Hypothyroidism, hyperthyroidism,,lymphadenopathy Chest : Shortness of breath, history of COPD, Asthma Heart : Chest pain, history of coronary arterey disease GI:  Nausea, vomiting, diarrhea, constipation, GERD GU: Dysuria, urgency, frequency of urination, hematuria Neuro: Stroke, seizures, syncope Psych: Depression, anxiety, hallucinations   Physical  Exam: Blood pressure 130/73, pulse 96, temperature 98.8 F (37.1 C), temperature source Oral, resp. rate 18, SpO2 96.00%. Constitutional:   Patient is a well-developed and well-nourished male in no acute distress and cooperative with exam. Head: Normocephalic and atraumatic Mouth: Mucus membranes moist Eyes: PERRL, EOMI, conjunctivae normal Neck: Supple, No Thyromegaly Cardiovascular: RRR, S1 normal, S2 normal Pulmonary/Chest: CTAB, no wheezes, rales, or rhonchi Abdominal: Soft. Non-tender, non-distended, bowel sounds are normal, no masses, organomegaly, or guarding present.  Neurological: A&O x3, Strenght is normal and symmetric bilaterally, cranial nerve II-XII are grossly intact, no focal motor deficit, sensory intact to light touch bilaterally.  Extremities : Right ankle is swollen, warm and tender to touch. No erythema noted. Right knee: Mild erythema , warm and  tender to touch   Labs on Admission:  Results for orders placed during the hospital encounter of 03/22/13 (from the past 48 hour(s))  BASIC METABOLIC PANEL     Status: Abnormal   Collection Time    03/22/13 12:43 PM      Result Value Range   Sodium 133 (*) 135 - 145 mEq/L   Potassium 4.2  3.5 - 5.1 mEq/L   Chloride 96  96 - 112 mEq/L   CO2 25  19 - 32 mEq/L   Glucose, Bld 142 (*) 70 - 99 mg/dL   BUN 18  6 - 23 mg/dL   Creatinine, Ser 2.95  0.50 - 1.35 mg/dL   Calcium 9.1  8.4 - 62.1 mg/dL   GFR calc non Af Amer 50 (*) >90 mL/min   GFR calc Af Amer 58 (*) >90 mL/min   Comment:            The eGFR has been calculated     using the CKD EPI equation.     This calculation has not been     validated in all clinical     situations.     eGFR's persistently     <90 mL/min signify     possible Chronic Kidney Disease.    Radiological Exams on Admission: No results found.  Assessment/Plan Active Problems:   HYPERTENSION   CAD   ARTHRITIS   Unspecified hereditary and idiopathic peripheral neuropathy   Knee pain,    Right knee/ankle pain ? Gout, will check the x-rays of the right knee and right ankle Obtain uric acid level Start colchicine 0.6 mg by mouth twice a day Will defer starting steroids as patient has history of taking gastric ulcer.  Peripheral neuropathy Continue amitriptyline, gabapentin Will start Dilaudid 1 mg IV every 4 hours when necessary for pain  Coronary artery disease Continue aspirin, isosorbide    Code status:DNR  Family discussion: Discussed with daughter at bedside   Time Spent on Admission: 60 min  [facility] Pager: [phone] 03/22/2013, 4:08 PM  If 7PM-7AM, please contact night-coverage  [email or web address]  Password [email or web address]

## 2013-03-22 NOTE — ED Provider Notes (Signed)
History    CSN: [id number] Arrival date & time 03/22/13  1152  First MD Initiated Contact with Patient 03/22/13 1154     Chief Complaint  Patient presents with  . Peripheral Neuropathy    bilat    (Consider location/radiation/quality/duration/timing/severity/associated sxs/prior Treatment) HPI Comments: 79 yo male lives at home, past smoker, vit B12 def, CAD, HTN, arthritis presents with worsening lower leg pain bilateral.  Worse on the right.  Worse the past month.  Pt has been admitted, MRI lumbar no acute findings.  PCP following, Dr Debby Bud.  Pain is sharp, mostly knees and below, partially right calf..  Patient denies active cancer, recent major trauma or surgery,recent long travel, hemoptysis or oral contraceptives.  Pt taking oxycodone however not helping, pt has had 8 the past day.   No urinary changes.  No weakness. Pain with any movement and basic constant severe.  No known PVD or stents.  .  The history is provided by the patient.   Past Medical History  Diagnosis Date  . Constipation, chronic   . Sebaceous cyst   . Other malaise and fatigue   . Pain in joint, shoulder region     Has chronic shoulder pain  . Persistent disorder of initiating or maintaining sleep   . Thrombocytopenia, unspecified   . Dementia     with periods of amnesia  . Other B-complex deficiencies   . Hypothyroidism   . Pain in limb   . Esophageal reflux   . Unspecified essential hypertension   . Arthropathy, unspecified, site unspecified   . Pure hypercholesterolemia   . Orthostasis   . PVC's (premature ventricular contractions)   . S/P cardiac cath 08/08/11    mild to moderate CAD primarily in the LAD. None are obstructive and appear stable from prior cath in 2007; managed medically  . Arthritis   . CAD (coronary artery disease)     last cath in 2012. Managed medically-some blockages  . Headache(784.0)   . 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 MRI Nov '11 -  tendonosis and partial tear. Left shoulder surgery Jan '12 for partial shoulder replacement. [name])   . Blood dyscrasia     thromboctopenia  . Pneumonia 1980's?; 2011  . History of stomach ulcers 11/2012  . Chronic lower back pain    Past Surgical History  Procedure Laterality Date  . Hand surgery Right     crush injury, right fifth digit contracture, limited  motion  . Lumbar spine surgery  2007    Dr Eloise Harman (Neurosurgeon)  . Knee surgery Left 1991    "did it twice in 1 wk" (02/17/2013)  . Cataract extraction Left 2009  . Shoulder open rotator cuff repair Left 8.30.2011  . US echocardiography  02-28-2010    Est EF 50-55%  . Cardiac catheterization  08/2011  . Shoulder hemi-arthroplasty    . Finger amputation Right     pinky finger  . Hardware removal  02/05/2012    Procedure: HARDWARE REMOVAL;  Surgeon: Eulas Post, MD;  Location: [facility] OR;  Service: Orthopedics;  Laterality: Left;  . Reverse shoulder arthroplasty  02/05/2012    Procedure: REVERSE SHOULDER ARTHROPLASTY;  Surgeon: Eulas Post, MD;  Location: [facility] OR;  Service: Orthopedics;  Laterality: Left;  . Back surgery    . Esophagogastroduodenoscopy N/A 11/19/2012    Procedure: ESOPHAGOGASTRODUODENOSCOPY (EGD);  Surgeon: Beverley Fiedler, MD;  Location: [facility] ENDOSCOPY;  Service: Gastroenterology;  Laterality: N/A;   Family  History  Problem Relation Age of Onset  . Breast cancer Mother   . Cancer Brother     throat  . Diabetes Daughter   . Colon cancer Neg Hx   . Kidney disease Father    History  Substance Use Topics  . Smoking status: Former Smoker    Types: Cigars    Quit date: 09/03/1978  . Smokeless tobacco: Current User    Types: Chew     Comment: 02/17/2013 "I aien't smoked a cigar in 20-30 yr"  . Alcohol Use: No    Review of Systems  Constitutional: Positive for fatigue. Negative for fever and chills.  HENT: Negative for neck pain and neck stiffness.   Eyes: Negative for visual disturbance.  Respiratory:  Negative for cough and shortness of breath.   Cardiovascular: Positive for leg swelling. Negative for chest pain.  Gastrointestinal: Negative for vomiting and abdominal pain.  Genitourinary: Negative for dysuria.  Musculoskeletal: Positive for arthralgias and gait problem. Negative for back pain.  Skin: Negative for rash.  Neurological: Negative for light-headedness and headaches.    Allergies  Crestor  Home Medications   Current Outpatient Rx  Name  Route  Sig  Dispense  Refill  . amitriptyline (ELAVIL) 25 MG tablet   Oral   Take 25 mg by mouth at bedtime.         Marland Kitchen aspirin EC 81 MG tablet   Oral   Take 81 mg by mouth daily.         . cholecalciferol (VITAMIN D) 1000 UNITS tablet   Oral   Take 1,000 Units by mouth daily.         . furosemide (LASIX) 40 MG tablet   Oral   Take 40 mg by mouth 2 (two) times daily.         Marland Kitchen gabapentin (NEURONTIN) 300 MG capsule   Oral   Take 300 mg by mouth 3 (three) times daily.         . isosorbide mononitrate (IMDUR) 60 MG 24 hr tablet   Oral   Take 60 mg by mouth daily.         . nitroGLYCERIN (NITROSTAT) 0.4 MG SL tablet   Sublingual   Place 0.4 mg under the tongue every 5 (five) minutes x 3 doses as needed for chest pain.          Marland Kitchen omeprazole (PRILOSEC) 40 MG capsule   Oral   Take 1 capsule (40 mg total) by mouth daily.   90 capsule   3   . oxyCODONE (OXY IR/ROXICODONE) 5 MG immediate release tablet   Oral   Take 5-10 mg by mouth every 6 (six) hours as needed for pain.         . potassium chloride SA (K-DUR,KLOR-CON) 20 MEQ tablet   Oral   Take 20 mEq by mouth 2 (two) times daily.         Marland Kitchen tiZANidine (ZANAFLEX) 4 MG tablet   Oral   Take 1 tablet (4 mg total) by mouth every 6 (six) hours as needed.   40 tablet   0   . calcium carbonate (OS-CAL) 600 MG TABS   Oral   Take 600 mg by mouth daily.         Marland Kitchen GLUCOSAMINE PO   Oral   Take 1 tablet by mouth daily.          BP 130/73  Pulse 96   Temp(Src) 98.8 F (37.1 C) (Oral)  Resp 18  SpO2 96% Physical Exam  Nursing note and vitals reviewed. Constitutional: He is oriented to person, place, and time. He appears well-developed and well-nourished.  HENT:  Head: Normocephalic and atraumatic.  Eyes: Conjunctivae are normal. Right eye exhibits no discharge. Left eye exhibits no discharge.  Neck: Normal range of motion. Neck supple. No tracheal deviation present.  Cardiovascular: Normal rate and regular rhythm.   No murmur heard. Pulmonary/Chest: Effort normal.  Abdominal: Soft. He exhibits no distension. There is no tenderness. There is no guarding.  Musculoskeletal: He exhibits edema and tenderness (tender to palpation and with all movement of LE bilateral, most severe right LE below knee, mild calf tender right, no rashes, mild warmth at ankle/ right knee however no induration/errythema).  Neurological: He is alert and oriented to person, place, and time.  Skin: Skin is warm. No rash noted.  Psychiatric: He has a normal mood and affect.    ED Course  Procedures (including critical care time) Initially unable to get US done in ED, bedside done. Emergency Ultrasound Study:  Limited Duplex of right lower extremity veins  with linear probe. Indication: leg pain or swelling Visualization of saphenous-femoral junction, proximal femoral vein and popliteal vein regions in transverse plane with full compression visualized.  Images stored on sonosite machine. Interpretation no dvt visualized.    Labs Reviewed  BASIC METABOLIC PANEL - Abnormal; Notable for the following:    Sodium 133 (*)    Glucose, Bld 142 (*)    GFR calc non Af Amer 50 (*)    GFR calc Af Amer 58 (*)    All other components within normal limits  URINALYSIS, ROUTINE W REFLEX MICROSCOPIC   No results found. No diagnosis found.  MDM  Will check a few other causes of leg pain. [place] RLE, lytes.  Pt unable to walk, needs assistance, pt lives alone-- he will  require assisted living/ NH- care manager contacted.  Ankle Brachial done, no signs of significant PVD. Filed Vitals:   03/22/13 1224  BP: 130/73  Pulse:   Temp:   Resp:    Rechecked after pain meds, improved however concern for gait and living alone. Spoke with pcp on call, rec calling hospitalist. Spoke with Dr Sharl Ma, agreed with observation. Updated family.  No results found.   Enid Skeens, MD 03/22/13 [phone]

## 2013-03-23 ENCOUNTER — Telehealth: Payer: Self-pay | Admitting: *Deleted

## 2013-03-23 DIAGNOSIS — M129 Arthropathy, unspecified: Secondary | ICD-10-CM

## 2013-03-23 DIAGNOSIS — R52 Pain, unspecified: Secondary | ICD-10-CM

## 2013-03-23 DIAGNOSIS — I10 Essential (primary) hypertension: Secondary | ICD-10-CM

## 2013-03-23 DIAGNOSIS — M25569 Pain in unspecified knee: Secondary | ICD-10-CM

## 2013-03-23 LAB — COMPREHENSIVE METABOLIC PANEL
BUN: 18 mg/dL (ref 6–23)
Calcium: 8.8 mg/dL (ref 8.4–10.5)
GFR calc Af Amer: 56 mL/min — ABNORMAL LOW (ref >90)
GFR calc non Af Amer: 49 mL/min — ABNORMAL LOW (ref >90)
Glucose, Bld: 156 mg/dL — ABNORMAL HIGH (ref 70–99)
Total Protein: 6.8 g/dL (ref 6.0–8.3)

## 2013-03-23 LAB — CBC
HCT: 42 % (ref 39.0–52.0)
Hemoglobin: 14.1 g/dL (ref 13.0–17.0)
MCH: 29.7 pg (ref 26.0–34.0)
MCHC: 33.6 g/dL (ref 30.0–36.0)
MCV: 88.6 fL (ref 78.0–100.0)

## 2013-03-23 MED ORDER — DICLOFENAC SODIUM 1 % TD GEL
4.0000 g | Freq: Four times a day (QID) | TRANSDERMAL | Status: DC
  Administered 2013-03-23 – 2013-03-24 (×7): 4 g via TOPICAL
  Filled 2013-03-23 (×2): qty 100

## 2013-03-23 MED ORDER — OXYCODONE-ACETAMINOPHEN 5-325 MG PO TABS
1.0000 | ORAL_TABLET | Freq: Four times a day (QID) | ORAL | Status: DC
  Administered 2013-03-23 (×4): 1 via ORAL
  Filled 2013-03-23 (×4): qty 1

## 2013-03-23 MED ORDER — PREDNISONE 20 MG PO TABS
20.0000 mg | ORAL_TABLET | Freq: Two times a day (BID) | ORAL | Status: DC
  Administered 2013-03-23 – 2013-03-24 (×4): 20 mg via ORAL
  Filled 2013-03-23 (×5): qty 1

## 2013-03-23 NOTE — Telephone Encounter (Signed)
Called dtr. Tough situation in regard to long term care for him.

## 2013-03-23 NOTE — Telephone Encounter (Signed)
[name] called requesting a status of her fathers in patient stay.  Further states no one has contacted her on her fathers condition or status.  Please advise

## 2013-03-23 NOTE — Progress Notes (Signed)
Subjective: Mr. Turman was admitted with intractabl;e leg pain. He has been followed for this problem for months. He has had plain x-rays and MRI lumbar spine with some derangement but no evidence of radiculopathy. He has been seen on consultation by neurosurgery in June with no evidence of a radiculopathy or need for surgery. He has been follow by Dr. Dion Saucier for orthopedics in regard to knee pain. He does have DJD but to the level of his current complaint. He does have a h/o peripheral neuropathy. He has had UGI bleed in May due to NSAID use and has been of this class of medication since. He had been well controlled with narcotic pain medication. He evidently ran out of medication many days ago. [facility] for leg pain and was restarted on his medication but the pain was so great that he presented to ED July 20th and has been admitted.   Mr. Niday is easily awakened. He does complain of continue pain, worse at the right knee Objective: Lab:  Recent Labs  03/22/13 1757 03/23/13 0409  WBC 10.7* 12.3*  HGB 15.2 14.1  HCT 45.6 42.0  MCV 89.8 88.6  PLT 181 171    Recent Labs  03/22/13 1243 03/23/13 0409  NA 133* 131*  K 4.2 4.2  CL 96 95*  GLUCOSE 142* 156*  BUN 18 18  CREATININE 1.28 1.32  CALCIUM 9.1 8.8    Imaging: RIGHT KNEE - COMPLETE 4+ VIEW  Comparison: Two views right knee 02/17/2013.  Findings: No acute bony or joint abnormality is identified. There  is chondrocalcinosis. Joint space loss and osteophytosis are  present about the knee. No joint effusion.  IMPRESSION:  1. No acute finding.  2. Chondrocalcinosis and osteoarthritis.  RIGHT ANKLE - COMPLETE 3+ VIEW  Comparison: None.  Findings: No acute bony or joint abnormality is identified. There  appears to be some soft tissue swelling about the ankle.  Atherosclerosis is noted.  IMPRESSION:  Soft tissue swelling without underlying focal bony or joint  abnormality.  Scheduled  Meds: . amitriptyline  25 mg Oral QHS  . aspirin EC  81 mg Oral q morning - 10a  . colchicine  0.6 mg Oral BID  . enoxaparin (LOVENOX) injection  40 mg Subcutaneous Q24H  . furosemide  40 mg Oral BID  . gabapentin  300 mg Oral TID  . isosorbide mononitrate  60 mg Oral q morning - 10a  . pantoprazole  40 mg Oral Daily  . potassium chloride SA  20 mEq Oral BID  . sodium chloride  3 mL Intravenous Q12H   Continuous Infusions:  PRN Meds:.sodium chloride, HYDROmorphone (DILAUDID) injection, sodium chloride, tiZANidine   Physical Exam: Filed Vitals:   03/23/13 0433  BP: 105/54  Pulse: 94  Temp: 98.3 F (36.8 C)  Resp: 16  Gen'l- elderly white man in no acute distress but with a great deal of knee pain HEENT - C&S clear, PERRLA Cor- 2+ radial, RRR PUlm - normal respirations MSK - right knee chronic enlargement, not red, warm, very tender, pt will not flex the knee due to pain.  left knee - warm, not red, tender, he will not flex due to pain  Right ankle - w/o erythema, tender to movement      Assessment/Plan: 1. Ortho - patient with very painful knees but no acute derangement by plain films, warm to touch but not red and uric acid level is normal. Ankle also tender, w/o erythema  or swelling and w/o radiographic derangement  Plan Continue colchicine for now  Add prednisone 20 mg twice a day  Voltaren gel  8 drops to each knee 4 times a day  Oxycodone/ APAP 7.5/325 qid  PT/OT will be ordered tomorrow since his current level of pain will not allow him to participate in care.    Illene Regulus [place] [facility] (o) [phone]; (c) [phone] Call-grp - [facility]  Tele: [phone]  03/23/2013, 7:34 AM

## 2013-03-23 NOTE — Progress Notes (Signed)
Clinical Social Work Department BRIEF PSYCHOSOCIAL ASSESSMENT 03/23/2013  Patient:  [name] Elliott, [name] Elliott     Account Number:  [id number]     Admit date:  03/22/2013  Clinical Social Worker:  Jacelyn Grip  Date/Time:  03/23/2013 10:15 AM  Referred by:  Care Management  Date Referred:  03/23/2013 Referred for  SNF Placement   Other Referral:   Interview type:  Patient Other interview type:    PSYCHOSOCIAL DATA Living Status:  ALONE Admitted from facility:   Level of care:   Primary support name:  [name] Reid/[phone] Primary support relationship to patient:  CHILD, ADULT Degree of support available:   adequate    CURRENT CONCERNS Current Concerns  Post-Acute Placement   Other Concerns:    SOCIAL WORK ASSESSMENT / PLAN CSW received notification from [facility] that pt may need SNF placement.    Per RN, MD does not yet want to order PT/OT due to pt pain. Per chart, pt has [facility] which would require PT/OT for SNF authorization if recommendation is for SNF.    CSW met with pt at bedside to discuss. Pt reports that he is from home alone and had cane present at bedside and confirms that he walks with cane at home. CSW discussed that PT/OT will likely evaluate pt tomorrow for recommendations on discharge plan, but there is a potential that recommendation will be for rehab at [facility]. Pt states that he is hopeful that recommendation will be for home health services. Pt states that he has been to [facility] in the past and does not want to return to rehab at this time. Pt discussed that his preference is to return home and pt is not agreeable to initiation of SNF search .    Pt agreeable to CSW following up following PT/OT evaluation of pt to further discuss possible discharge needs.    CSW notified RNCM that at this time pt would like to return home with home health services.    CSW to continue to follow to assist with pt disposition needs as appropriate.   Assessment/plan  status:  Psychosocial Support/Ongoing Assessment of Needs Other assessment/ plan:   discharge planning   Information/referral to community resources:   Referral to [facility] for possible HH needs    PATIENT'S/FAMILY'S RESPONSE TO PLAN OF CARE: Pt alert and oriented x 4. Pt preference is to return home at discharge with home health assistance and pt is not agreeable to exploring SNF option at this time. Pt agreeable to CSW returning once PT/OT has evaluated to further discuss if pt is able to manage needs at home.    Jacklynn Lewis, MSW, LCSWA  Clinical Social Work [phone]

## 2013-03-23 NOTE — Care Management Note (Signed)
Cm spoke with Dr.Norins concerning pt's conversation with CSW. Pt refusing SNF. Awaiting PT/OT eval. CM informed Dr.Norins of MD Advisor recommendations to keep pt in OBS status with hopeful discharge within 48 hours.    Roxy Manns Brook Geraci,RN,BSN [phone]

## 2013-03-23 NOTE — Progress Notes (Signed)
Nutrition Brief Note  Patient identified on the Malnutrition Screening Tool (MST) Report  Body mass index is 27.14 kg/(m^2). Patient meets criteria for overweight based on current BMI.   Current diet order is heart healthy, patient is consuming approximately 100% of meals at this time. Labs and medications reviewed. Noted pt with low sodium. Pt reports eating good PTA, noted pt missing teeth but had dentures present at bedside. Pt with dementia. Discussed pt's nutrition with daughter over the phone. She reports pt did not eat well for 2 days PTA due to pain but states he typically eats well, 3 meals/day. Pt not on any nutritional supplements at home. She reports pt may have lost a few pounds in the past few weeks. Pt currently eating well. Will change diet order to automatic trays as pt sometimes forgets to order meals.   No nutrition interventions warranted at this time. If nutrition issues arise, please consult RD.   Levon Hedger MS, RD, LDN [phone] Pager [phone] After Hours Pager

## 2013-03-24 DIAGNOSIS — G609 Hereditary and idiopathic neuropathy, unspecified: Secondary | ICD-10-CM

## 2013-03-24 DIAGNOSIS — R269 Unspecified abnormalities of gait and mobility: Secondary | ICD-10-CM

## 2013-03-24 DIAGNOSIS — M25579 Pain in unspecified ankle and joints of unspecified foot: Secondary | ICD-10-CM

## 2013-03-24 MED ORDER — OXYCODONE HCL 5 MG PO TABS
5.0000 mg | ORAL_TABLET | ORAL | Status: DC | PRN
  Filled 2013-03-24: qty 1

## 2013-03-24 MED ORDER — OXYCODONE-ACETAMINOPHEN 5-325 MG PO TABS
1.0000 | ORAL_TABLET | Freq: Four times a day (QID) | ORAL | Status: DC
  Administered 2013-03-24 (×3): 1 via ORAL
  Filled 2013-03-24 (×2): qty 1

## 2013-03-24 MED ORDER — DICLOFENAC SODIUM 1 % TD GEL: 4.0000 g | Freq: Four times a day (QID) | TRANSDERMAL | Status: DC

## 2013-03-24 MED ORDER — OXYCODONE HCL 5 MG PO TABS
5.0000 mg | ORAL_TABLET | Freq: Four times a day (QID) | ORAL | Status: DC
  Administered 2013-03-24 (×3): 5 mg via ORAL
  Filled 2013-03-24 (×2): qty 1

## 2013-03-24 MED ORDER — PREDNISONE 20 MG PO TABS: 20.0000 mg | ORAL_TABLET | Freq: Every day | ORAL | Status: DC

## 2013-03-24 MED ORDER — OXYCODONE-ACETAMINOPHEN 5-325 MG PO TABS
1.0000 | ORAL_TABLET | ORAL | Status: DC | PRN
  Filled 2013-03-24: qty 1

## 2013-03-24 NOTE — Evaluation (Addendum)
Physical Therapy Evaluation Patient Details Name: [name] Elliott MRN: [id number] DOB: [birth year] Today's Date: 03/24/2013 Time: 4098-1191 PT Time Calculation (min): 23 min  PT Assessment / Plan / Recommendation History of Present Illness  79 y.o. male admitted with RLE pain and difficulty walking.   Clinical Impression  **Pt ambulated with straight cane at home, today he walked 300' with a RW. Encouraged use of RW at home for increased support. OK to DC home from PT standpoint, he is independent with mobility with RW. No loss of balance. *    PT Assessment  Patent does not need any further PT services    Follow Up Recommendations  No PT follow up    Does the patient have the potential to tolerate intense rehabilitation      Barriers to Discharge        Equipment Recommendations  None recommended by PT    Recommendations for Other Services     Frequency      Precautions / Restrictions Precautions Precautions: None Restrictions Weight Bearing Restrictions: No   Pertinent Vitals/Pain *0/10 Pt denied RLE pain while walking**      Mobility  Bed Mobility Bed Mobility: Supine to Sit Supine to Sit: 6: Modified independent (Device/Increase time);HOB elevated;With rails Transfers Transfers: Sit to Stand;Stand to Sit Sit to Stand: 6: Modified independent (Device/Increase time);From bed;With upper extremity assist Stand to Sit: 6: Modified independent (Device/Increase time);To chair/3-in-1;With armrests;With upper extremity assist Ambulation/Gait initially walked with SPC, noted pt was reaching for bedrails while using cane so switched to RW; encouraged use of RW at home for increased support Ambulation/Gait Assistance: 6: Modified independent (Device/Increase time) Ambulation Distance (Feet): 300 Feet Assistive device: Rolling walker Gait Pattern: Within Functional Limits General Gait Details: VCs for hand positioning on RW and to step into frame of RW, no LOB     Exercises     PT Diagnosis:    PT Problem List:   PT Treatment Interventions:       PT Goals(Current goals can be found in the care plan section)    Visit Information  Last PT Received On: 03/24/13 Assistance Needed: +1 History of Present Illness: 79 y.o. male admitted with RLE pain and difficulty walking.        Prior Functioning  Home Living Family/patient expects to be discharged to:: Private residence Living Arrangements: Alone Available Help at Discharge: Family;Available PRN/intermittently Type of Home: House Home Access: Stairs to enter Entrance Stairs-Number of Steps: 2 Home Layout: One level Home Equipment: Cane - single point;Wheelchair - Economist - 2 wheels Prior Function Level of Independence: Independent with assistive device(s) Comments: walked with cane at home PTA Communication Communication: HOH    Cognition  Cognition Arousal/Alertness: Awake/alert Behavior During Therapy: WFL for tasks assessed/performed Overall Cognitive Status: Within Functional Limits for tasks assessed    Extremity/Trunk Assessment Upper Extremity Assessment Upper Extremity Assessment: LUE deficits/detail LUE Deficits / Details: shoulder elevation approx 90*, had shoulder replacement 14 months ago per pt Lower Extremity Assessment Lower Extremity Assessment: Overall WFL for tasks assessed Cervical / Trunk Assessment Cervical / Trunk Assessment: Normal   Balance    End of Session PT - End of Session Equipment Utilized During Treatment: Gait belt Activity Tolerance: Patient tolerated treatment well Patient left: in chair;with call bell/phone within reach Nurse Communication: Mobility status  GP Functional Assessment Tool Used: clinical judgement Functional Limitation: Mobility: Walking and moving around Mobility: Walking and Moving Around Current Status (Y7829): 0 percent impaired, limited or restricted Mobility: Walking  and Moving Around Goal Status  [phone]): 0 percent impaired, limited or restricted Mobility: Walking and Moving Around Discharge Status [phone]): 0 percent impaired, limited or restricted   Tamala Ser 03/24/2013, 9:43 AM [phone]

## 2013-03-24 NOTE — Care Management Note (Signed)
Cm reviewed PT/OT eval. No further PT/OT recommended as outpatient. No other needs identified. Awaiting MD order for discharge. Cm to sign off.   Roxy Manns Judyann Casasola,RN,BSN [phone]

## 2013-03-24 NOTE — Evaluation (Addendum)
Occupational Therapy Evaluation Patient Details Name: [name] Elliott MRN: [id number] DOB: [birth year] Today's Date: 03/24/2013 Time: 4098-1191 OT Time Calculation (min): 20 min  OT Assessment / Plan / Recommendation History of present illness 79 y.o. male admitted with RLE pain and difficulty walking.    Clinical Impression   Pt overall at supervision level with ADL and min guard with tub transfer. Pt somewhat frustrated with questions regarding home setup and requests to practice ADL but he did complete requested tasks. Would benefit from one more session if pt agreeable to practice with RW (PT recommendation to use at home) for ADL and repeat practice tub transfer.     OT Assessment  Patient needs continued OT Services    Follow Up Recommendations  No OT follow up;Supervision - Intermittent    Barriers to Discharge      Equipment Recommendations  None recommended by OT    Recommendations for Other Services    Frequency  Min 2X/week    Precautions / Restrictions Precautions Precautions: None Restrictions Weight Bearing Restrictions: No   Pertinent Vitals/Pain States "knees" when asked if he had pain but didn't rate.     ADL  Eating/Feeding: Independent Where Assessed - Eating/Feeding: Chair Grooming: Teeth care;Supervision/safety Where Assessed - Grooming: Unsupported standing Upper Body Bathing: Chest;Right arm;Left arm;Abdomen;Supervision/safety Where Assessed - Upper Body Bathing: Unsupported standing Lower Body Bathing: Supervision/safety Where Assessed - Lower Body Bathing: Unsupported standing (pt bending over to wash feet when OT came. nsg tech present.) Upper Body Dressing: Supervision/safety (with gown) Where Assessed - Upper Body Dressing: Unsupported standing Lower Body Dressing: Supervision/safety (pt does struggle slightly with socks due to knee pain) Where Assessed - Lower Body Dressing: Supported sit to Pharmacist, hospital: Supervision/safety (with  increased time to descent and stand) Toilet Transfer Method: Other (comment) (used cane to transfer to toilet) Acupuncturist: Comfort height toilet;Grab bars Toileting - Clothing Manipulation and Hygiene: Supervision/safety Where Assessed - Engineer, mining and Hygiene: Sit to stand from 3-in-1 or toilet Tub/Shower Transfer: Min guard (step over similar to tub hold to wall of shower) Equipment Used: Cane ADL Comments: Reinforced PT recommendation of using RW at home. Pt verbalized understanding. Pt initially ok with OT quetions but becoming frustrated when OT asked pt more questions and requested he perform more ADL tasks. Pt stating he can manage activities at home. Explained purpose of OT visit further and why certain tasks requested. Pt seemed to calm down some. Cautioned pt to be careful and take his time as he returns home. He has family that can help PRN. Pt already in bathroom when OT arrived and had cane.     OT Diagnosis:    OT Problem List: Decreased strength;Decreased knowledge of use of DME or AE OT Treatment Interventions: Self-care/ADL training;DME and/or AE instruction;Therapeutic activities;Patient/family education   OT Goals(Current goals can be found in the care plan section) Acute Rehab OT Goals Patient Stated Goal: to return home Time For Goal Achievement: 04/07/13 Potential to Achieve Goals: Good ADL Goals Pt Will Perform Tub/Shower Transfer: with modified independence;Tub transfer;shower seat Additional ADL Goal #1: Pt will gather all items for B/D with modified independence and RW  Visit Information  Last OT Received On: 03/24/13 Assistance Needed: +1 History of Present Illness: 79 y.o. male admitted with RLE pain and difficulty walking.        Prior Functioning     Home Living Family/patient expects to be discharged to:: Private residence Living Arrangements: Alone Available Help at Discharge: Family;Available  PRN/intermittently Type of Home: House Home Access: Stairs to enter [employer] of Steps: 2 Home Layout: One level Home Equipment: Cane - single point;Wheelchair - Economist - 2 wheels Prior Function Level of Independence: Independent with assistive device(s) Comments: walked with cane at home PTA Communication Communication: HOH         Vision/Perception     Cognition  Cognition Arousal/Alertness: Awake/alert Behavior During Therapy: WFL for tasks assessed/performed Overall Cognitive Status: Within Functional Limits for tasks assessed    Extremity/Trunk Assessment Upper Extremity Assessment Upper Extremity Assessment: LUE deficits/detail LUE Deficits / Details: shoulder elevation approx 90*, had shoulder replacement 14 months ago per pt. pt states he stopped going to therapy because it was too expensive. Lower Extremity Assessment Lower Extremity Assessment: Overall WFL for tasks assessed Cervical / Trunk Assessment Cervical / Trunk Assessment: Normal     Mobility Bed Mobility Bed Mobility: Supine to Sit Supine to Sit: 6: Modified independent (Device/Increase time);HOB elevated;With rails Transfers Transfers: Sit to Stand;Stand to Sit Sit to Stand: 5: Supervision;With upper extremity assist;From toilet Stand to Sit: 5: Supervision;With upper extremity assist;To toilet;To chair/3-in-1 Details for Transfer Assistance: increased time and had some difficulty rising from toilet surface. no assist however needed.     Exercise     Balance Balance Balance Assessed: Yes Dynamic Standing Balance Dynamic Standing - Level of Assistance: 5: Stand by assistance   End of Session OT - End of Session Activity Tolerance: Patient tolerated treatment well Patient left: in chair;with call bell/phone within reach  GO Functional Assessment Tool Used: clinical judgement Functional Limitation: Self care Self Care Current Status (N8295): At least 1 percent  but less than 20 percent impaired, limited or restricted Self Care Goal Status (A2130): 0 percent impaired, limited or restricted   Lennox Laity [phone] 03/24/2013, 12:23 PM

## 2013-03-24 NOTE — Progress Notes (Signed)
Patient doing much better - able to ambulate independently with a cane.  He is ready for d/c home on oxycodone, prednisone 20 mg daily and voltaren gel.  Follow in 5-7 days.   Dictated # [id number]

## 2013-03-24 NOTE — Progress Notes (Signed)
[name] Elliott was discharged from the hospital with family to home. Rx's phoned into pharmacy, discharge instructions reviewed with daughter. No concerns voiced.

## 2013-03-24 NOTE — Progress Notes (Signed)
CSW reviewed chart and noted that PT/OT did not recommend that pt need any rehab in a skilled nursing facility.  No further social work needs identified at this time.  CSW signing off.   Jacklynn Lewis, MSW, LCSWA  Clinical Social Work [phone]

## 2013-03-24 NOTE — Progress Notes (Signed)
Subjective: Awake and reports that the knee pain is better.  Objective: Lab:  Recent Labs  03/22/13 1757 03/23/13 0409  WBC 10.7* 12.3*  HGB 15.2 14.1  HCT 45.6 42.0  MCV 89.8 88.6  PLT 181 171    Recent Labs  03/22/13 1243 03/23/13 0409  NA 133* 131*  K 4.2 4.2  CL 96 95*  GLUCOSE 142* 156*  BUN 18 18  CREATININE 1.28 1.32  CALCIUM 9.1 8.8    Imaging:  Scheduled Meds: . amitriptyline  25 mg Oral QHS  . aspirin EC  81 mg Oral q morning - 10a  . colchicine  0.6 mg Oral BID  . diclofenac sodium  4 g Topical QID  . enoxaparin (LOVENOX) injection  40 mg Subcutaneous Q24H  . furosemide  40 mg Oral BID  . gabapentin  300 mg Oral TID  . isosorbide mononitrate  60 mg Oral q morning - 10a  . oxyCODONE-acetaminophen  1 tablet Oral QID  . pantoprazole  40 mg Oral Daily  . potassium chloride SA  20 mEq Oral BID  . predniSONE  20 mg Oral BID WC  . sodium chloride  3 mL Intravenous Q12H   Continuous Infusions:  PRN Meds:.sodium chloride, sodium chloride   Physical Exam: Filed Vitals:   03/24/13 0531  BP: 114/66  Pulse: 66  Temp: 94.5 F (34.7 C)  Resp: 16   Ext - able to lift legs off the bed, able to flex both knees, L>R, decreased tenderness to palpation, decreased warmth     Assessment/Plan: 1. Leg pain - suspect combination of prednisone and narcotics have helped. Doubt colchicine has played much of a role given normal uric acid level and atypical presentation.  Plan   PT evaluation today  Probable discharge this PM - will talk to dtr/POA about safe arrangements. He refuses AL   Illene Regulus [place] [facility] (o) [phone]; (c) [phone] Call-grp - [facility]  Tele: [phone]  03/24/2013, 7:22 AM

## 2013-03-25 NOTE — Discharge Summary (Signed)
[name] Elliott, [name] Elliott NO.:  [id number]  MEDICAL RECORD NO.:  [id number]  LOCATION:  1314                         FACILITY:  [facility]  PHYSICIAN:  Rosalyn Gess. Norins, MD  DATE OF BIRTH:  05-13-1928  DATE OF ADMISSION:  03/22/2013 DATE OF DISCHARGE:  03/24/2013                              DISCHARGE SUMMARY   ADMITTING DIAGNOSES: 1. Refractory knee pain, inability to bear weight. 2. Right ankle pain. 3. Peripheral neuropathy. 4. History of coronary artery disease.  DISCHARGE DIAGNOSES: 1. Refractory knee pain, inability to bear weight. 2. Right ankle pain. 3. Peripheral neuropathy. 4. History of coronary artery disease.  CONSULTANTS:  None.  PROCEDURES: 1. X-ray of the right ankle which showed soft tissue swelling without     underlying focal bony or joint abnormality. 2. Knee x-ray, complete right knee with no acute findings.     Chondrocalcinosis and osteoarthritis are noted.  Joint space loss     and osteophytosis are present.  No joint effusion was noted.  HISTORY OF PRESENT ILLNESS:  [name] Elliott has had a problem over the last several months of severe pain in his legs.  He was hospitalized in June, when MRI revealed possible disk disease.  He was seen by Dr. Franky Elliott, who felt there was no indication of radiculopathy.  The patient has been followed as an outpatient by Dr. Dion Elliott, from Orthopedics and the patient did recently complete a hyaluronidase injection series to his knees.  The patient presented to the [facility] at [facility] on the 19th because of severe refractory pain.  The patient had run out of his OxyCodone.  Refill prescription was provided.  The patient had  continued and progressive pain despite taking large doses of OxyCodone and presented to the emergency department at [facility]. Because of his severe pain and inability to bear weight.  The patient was admitted.  See the H and P for past medical history,  family history, and social history, as well as his admission examination.  HOSPITAL COURSE: 1. Knee pain.  The patient with x-ray that did show a significant     osteoarthritic change.  There were no changes of gouty tophi or     other gout related changes.  The patient's presentation was     significant for tenderness, mild swelling, but no erythema.  Uric     acid level was drawn that was normal range.  The patient had no     prior history of gout.  The patient was started on colchicine at     time of admission.  On his first full hospital day, the patient was started on prednisone. Over the next 24 hours, the patient had marked improvement and was able to bear weight and ambulate with use of a cane only with very good control of his pain with a combination of steroids as well as continuing his OxyCodone.  Physical therapy saw the patient in evaluation and felt that the patient could ambulate without assistance and no further physical therapy was recommended.  He was encouraged to use a rolling walker at home for increased support, but he was cleared for discharge.  The  patient was seen by occupational therapy, who felt the patient overall could use some supervision with ADLs and minimal guarding with tub transfer.  It was thought the patient might benefit from additional session of occupational therapy, to practice with his rolling walker, but he did not need ongoing OT services.  With the patient's pain under control, with PT/OT evaluation is being Completed, at this time the patient is ready for discharge to home.  DISCHARGE EXAMINATION:  VITAL SIGNS:  Temperature was 96.7, blood pressure 120/70, heart rate 73, respirations 18, oxygen saturation is 100% on room air. GENERAL APPEARANCE:  This is a short statured elderly gentleman in no acute distress. HEENT:  Conjunctivae and sclerae were clear.  There is no sign of trauma or abnormality. CARDIOVASCULAR:  The patient had a  regular rate and rhythm. PULMONARY:  The patient had normal respirations. MUSCULOSKELETAL:  The patient is ambulating independently with a cane with a slope and halting gait.  He is not complaining of pain or discomfort. NEUROLOGIC:  The patient is awake.  He is alert.  He is oriented to person, place, time, and context with memory is somewhat challenging.  FINAL LABORATORY DATA:  From March 23, 2013; sodium was 131, potassium 4.2, chloride 95, CO2 of 26, BUN of 18, creatinine 1.32, glucose was 156.  Alkaline phosphatase 45, albumin was 3, AST was 12, ALT was 6, total protein was 6.8, total bilirubin 1.2.  White count was 12,300, hemoglobin 14.1 g, platelet count 171,000.  Uric acid was 5.5.  DISCHARGE MEDICATIONS: 1. The patient will resume amitriptyline 25 mg at bedtime. 2. Aspirin 81 mg daily. 3. Calcium carbonate/Os-Cal 600 mg tablets every morning. 4. Vitamin D 1000 units daily. 5. Diclofenac 1% transdermal gel 4 g applied to the knee 4 times     daily. 6. Lasix 40 mg 2 times a day. 7. Neurontin 300 mg 3 times a day. 8. Glucosamine 1 tablet every morning. 9. Imdur 60 mg 1 tablet every morning. 10.Sublingual nitroglycerin every 5 minutes x3 doses p.r.n. acute     chest pain. 11.Omeprazole 40 mg p.o. q.a.m. 12.Oxycodone 5 mg, 1-2 tablets every 6 hours as needed for pain. 13.Potassium 20 mEq 2 times daily. 14.Prednisone 20 mg daily until seen in followup. 15.Zanaflex 4 mg every 6 hours as needed for severe muscle spasm,  DISPOSITION:  The patient is to be discharged to home.  His family is here to transport him.  The patient will be seen in the office in 5-7 days for followup.  The patient's condition at time of discharge dictation is stable and improved.     Rosalyn Gess Norins, MD     MEN/MEDQ  D:  03/24/2013  T:  03/25/2013  Job:  [id number]

## 2013-03-31 ENCOUNTER — Ambulatory Visit (INDEPENDENT_AMBULATORY_CARE_PROVIDER_SITE_OTHER): Payer: Medicare Other | Admitting: Internal Medicine

## 2013-03-31 ENCOUNTER — Encounter: Payer: Self-pay | Admitting: Internal Medicine

## 2013-03-31 VITALS — BP 132/60 | HR 86 | Temp 97.7°F | Wt 156.8 lb

## 2013-03-31 DIAGNOSIS — M25562 Pain in left knee: Secondary | ICD-10-CM

## 2013-03-31 DIAGNOSIS — M129 Arthropathy, unspecified: Secondary | ICD-10-CM

## 2013-03-31 DIAGNOSIS — M25569 Pain in unspecified knee: Secondary | ICD-10-CM

## 2013-03-31 MED ORDER — OXYCODONE HCL 5 MG PO TABS: 5.0000 mg | ORAL_TABLET | Freq: Two times a day (BID) | ORAL | Status: DC

## 2013-03-31 MED ORDER — OXYCODONE HCL 5 MG PO TABS
5.0000 mg | ORAL_TABLET | Freq: Two times a day (BID) | ORAL | Status: DC
Start: 2013-03-31 — End: 2013-03-31

## 2013-03-31 MED ORDER — POTASSIUM CHLORIDE CRYS ER 20 MEQ PO TBCR: 20.0000 meq | EXTENDED_RELEASE_TABLET | Freq: Every day | ORAL | Status: DC

## 2013-03-31 NOTE — Progress Notes (Signed)
Subjective:    Patient ID: [name] Elliott, male    DOB: [birth year], 79 y.o.   MRN: [id number]  HPI [name] Elliott was hospitalized July 20-22nd: DISCHARGE DIAGNOSES:  1. Refractory knee pain, inability to bear weight.  2. Right ankle pain.  3. Peripheral neuropathy.  4. History of coronary artery disease. He has done well on prednisone 20 mg daily and voltaren gel 4 times a day. He feels great. Was able to ambulate into the office. He has had no adverse effects of steroids.  Past Medical History  Diagnosis Date  . Constipation, chronic   . Sebaceous cyst   . Other malaise and fatigue   . Pain in joint, shoulder region     Has chronic shoulder pain  . Persistent disorder of initiating or maintaining sleep   . Thrombocytopenia, unspecified   . Dementia     with periods of amnesia  . Other B-complex deficiencies   . Hypothyroidism   . Pain in limb   . Esophageal reflux   . Unspecified essential hypertension   . Arthropathy, unspecified, site unspecified   . Pure hypercholesterolemia   . Orthostasis   . PVC's (premature ventricular contractions)   . S/P cardiac cath 08/08/11    mild to moderate CAD primarily in the LAD. None are obstructive and appear stable from prior cath in 2007; managed medically  . Arthritis   . CAD (coronary artery disease)     last cath in 2012. Managed medically-some blockages  . Headache(784.0)   . 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 MRI Nov '11 - tendonosis and partial tear. Left shoulder surgery Jan '12 for partial shoulder replacement. [name])   . Blood dyscrasia     thromboctopenia  . Pneumonia 1980's?; 2011  . History of stomach ulcers 11/2012  . Chronic lower back pain    Past Surgical History  Procedure Laterality Date  . Hand surgery Right     crush injury, right fifth digit contracture, limited  motion  . Lumbar spine surgery  2007    Dr Eloise Harman (Neurosurgeon)  . Knee surgery Left 1991    "did it twice in  1 wk" (02/17/2013)  . Cataract extraction Left 2009  . Shoulder open rotator cuff repair Left 8.30.2011  . US echocardiography  02-28-2010    Est EF 50-55%  . Cardiac catheterization  08/2011  . Shoulder hemi-arthroplasty    . Finger amputation Right     pinky finger  . Hardware removal  02/05/2012    Procedure: HARDWARE REMOVAL;  Surgeon: Eulas Post, MD;  Location: [facility];  Service: Orthopedics;  Laterality: Left;  . Reverse shoulder arthroplasty  02/05/2012    Procedure: REVERSE SHOULDER ARTHROPLASTY;  Surgeon: Eulas Post, MD;  Location: [facility] OR;  Service: Orthopedics;  Laterality: Left;  . Back surgery    . Esophagogastroduodenoscopy N/A 11/19/2012    Procedure: ESOPHAGOGASTRODUODENOSCOPY (EGD);  Surgeon: Beverley Fiedler, MD;  Location: [facility] ENDOSCOPY;  Service: Gastroenterology;  Laterality: N/A;   Family History  Problem Relation Age of Onset  . Breast cancer Mother   . Cancer Brother     throat  . Diabetes Daughter   . Colon cancer Neg Hx   . Kidney disease Father    History   Social History  . Marital Status: Widowed    Spouse Name: N/A    Number of Children: 2  . Years of Education: N/A   Occupational History  . retired  Social History Main Topics  . Smoking status: Former Smoker    Types: Cigars    Quit date: 09/03/1978  . Smokeless tobacco: Current User    Types: Chew     Comment: 02/17/2013 "I aien't smoked a cigar in 20-30 yr"  . Alcohol Use: No  . Drug Use: No  . Sexually Active: No   Other Topics Concern  . Not on file   Social History Narrative   2nd grade education. Married '58, '95. 2 dtr '59, '64, youngest daughter died septic kidney. Lives alone, handles all ADLs             Current Outpatient Prescriptions on File Prior to Visit  Medication Sig Dispense Refill  . amitriptyline (ELAVIL) 25 MG tablet Take 25 mg by mouth at bedtime.      Marland Kitchen aspirin EC 81 MG tablet Take 81 mg by mouth every morning.       . calcium carbonate (OS-CAL) 600 MG  TABS Take 600 mg by mouth every morning.       . cholecalciferol (VITAMIN D) 1000 UNITS tablet Take 1,000 Units by mouth every morning.       . diclofenac sodium (VOLTAREN) 1 % GEL Apply 4 g topically 4 (four) times daily. To each knee  3 Tube  2  . furosemide (LASIX) 40 MG tablet Take 40 mg by mouth 2 (two) times daily.      Marland Kitchen gabapentin (NEURONTIN) 300 MG capsule Take 300 mg by mouth 3 (three) times daily.      Marland Kitchen GLUCOSAMINE PO Take 1 tablet by mouth every morning.       . isosorbide mononitrate (IMDUR) 60 MG 24 hr tablet Take 60 mg by mouth every morning.       . nitroGLYCERIN (NITROSTAT) 0.4 MG SL tablet Place 0.4 mg under the tongue every 5 (five) minutes x 3 doses as needed for chest pain.       Marland Kitchen omeprazole (PRILOSEC) 40 MG capsule Take 40 mg by mouth every morning.      Marland Kitchen oxyCODONE (OXY IR/ROXICODONE) 5 MG immediate release tablet Take 5-10 mg by mouth every 6 (six) hours as needed for pain.      . potassium chloride SA (K-DUR,KLOR-CON) 20 MEQ tablet Take 20 mEq by mouth 2 (two) times daily.      . predniSONE (DELTASONE) 20 MG tablet Take 1 tablet (20 mg total) by mouth daily.  30 tablet  2  . tiZANidine (ZANAFLEX) 4 MG tablet Take 1 tablet (4 mg total) by mouth every 6 (six) hours as needed.  40 tablet  0  . [DISCONTINUED] Hyoscyamine Sulfate (HYOMAX-DT) 0.375 MG TBCR Take 1 tab bid for 5 days, then prn abd pain  30 each  1   No current facility-administered medications on file prior to visit.      Review of Systems System review is negative for any constitutional, cardiac, pulmonary, GI or neuro symptoms or complaints other than as described in the HPI.     Objective:   Physical Exam Filed Vitals:   03/31/13 1546  BP: 132/60  Pulse: 86  Temp: 97.7 F (36.5 C)   gen'l- Stocky well nourished man in no distress HEENT- C&S clear Cor- 2+ RADIAL RRR Pulm - clear Ext - good range of motion both knees without resistance or pain.        Assessment & Plan:

## 2013-03-31 NOTE — Assessment & Plan Note (Signed)
Remarkable improvement with the use of prednisone along with oxyIR and Voltaren gel.  Plan  Reduce prednisone to 10 mg daily  Continue oxycodone and voltaren gel.

## 2013-04-01 NOTE — Patient Instructions (Signed)
Continue present regimen. Reduce prednisone to 10 mg daily

## 2013-04-01 NOTE — Assessment & Plan Note (Signed)
Hospitalized July 20-22 for incapacitating knee pain - could not walk. Has done very well on prednisone 20 mg daily, oxycodone, Voltaren gel. He walked in with a cane, is in a very positive mood and has minimal pain.  Plan Reduce prednisone to 10 mg daily but continue full regimen

## 2013-05-22 ENCOUNTER — Other Ambulatory Visit: Payer: Self-pay | Admitting: *Deleted

## 2013-05-22 MED ORDER — AMITRIPTYLINE HCL 25 MG PO TABS: 25.0000 mg | ORAL_TABLET | Freq: Every day | ORAL | Status: DC

## 2013-05-26 ENCOUNTER — Encounter: Payer: Self-pay | Admitting: Internal Medicine

## 2013-05-26 ENCOUNTER — Ambulatory Visit (INDEPENDENT_AMBULATORY_CARE_PROVIDER_SITE_OTHER): Payer: Medicare Other | Admitting: Internal Medicine

## 2013-05-26 VITALS — BP 122/70 | HR 50 | Temp 98.9°F | Wt 157.8 lb

## 2013-05-26 DIAGNOSIS — Z23 Encounter for immunization: Secondary | ICD-10-CM

## 2013-05-26 DIAGNOSIS — M129 Arthropathy, unspecified: Secondary | ICD-10-CM

## 2013-05-26 DIAGNOSIS — I83893 Varicose veins of bilateral lower extremities with other complications: Secondary | ICD-10-CM

## 2013-05-26 NOTE — Progress Notes (Signed)
Subjective:    Patient ID: [name] Elliott, male    DOB: 1927-10-09, 79 y.o.   MRN: [id number]  HPI [name] Elliott presents for evaluation of spontaneous brusing of the left leg - medial aspect startin above the knee with extension of bruising to the foot. No recollection of any injury or strain. Mild pain proximally but not distally.  PMH, FamHx and SocHx reviewed for any changes and relevance.  Current Outpatient Prescriptions on File Prior to Visit  Medication Sig Dispense Refill  . aspirin EC 81 MG tablet Take 81 mg by mouth every morning.       . calcium carbonate (OS-CAL) 600 MG TABS Take 600 mg by mouth every morning.       . cholecalciferol (VITAMIN D) 1000 UNITS tablet Take 1,000 Units by mouth every morning.       . diclofenac sodium (VOLTAREN) 1 % GEL Apply 4 g topically 4 (four) times daily. To each knee  3 Tube  2  . furosemide (LASIX) 40 MG tablet Take 40 mg by mouth 2 (two) times daily.      Marland Kitchen gabapentin (NEURONTIN) 300 MG capsule Take 300 mg by mouth 3 (three) times daily.      Marland Kitchen GLUCOSAMINE PO Take 1 tablet by mouth every morning.       . isosorbide mononitrate (IMDUR) 60 MG 24 hr tablet Take 60 mg by mouth every morning.       . nitroGLYCERIN (NITROSTAT) 0.4 MG SL tablet Place 0.4 mg under the tongue every 5 (five) minutes x 3 doses as needed for chest pain.       Marland Kitchen omeprazole (PRILOSEC) 40 MG capsule Take 40 mg by mouth every morning.      Marland Kitchen oxyCODONE (OXY IR/ROXICODONE) 5 MG immediate release tablet Take 1 tablet (5 mg total) by mouth 2 (two) times daily. FILL ON OR AFTER 06/01/2013  60 tablet  0  . potassium chloride SA (K-DUR,KLOR-CON) 20 MEQ tablet Take 1 tablet (20 mEq total) by mouth daily. 1/2 tablet daily or 1 tab qOD      . predniSONE (DELTASONE) 20 MG tablet Take 1 tablet (20 mg total) by mouth daily.  30 tablet  2  . tiZANidine (ZANAFLEX) 4 MG tablet Take 1 tablet (4 mg total) by mouth every 6 (six) hours as needed.  40 tablet  0  . [DISCONTINUED] Hyoscyamine  Sulfate (HYOMAX-DT) 0.375 MG TBCR Take 1 tab bid for 5 days, then prn abd pain  30 each  1   No current facility-administered medications on file prior to visit.      Review of Systems Current Outpatient Prescriptions on File Prior to Visit  Medication Sig Dispense Refill  . aspirin EC 81 MG tablet Take 81 mg by mouth every morning.       . calcium carbonate (OS-CAL) 600 MG TABS Take 600 mg by mouth every morning.       . cholecalciferol (VITAMIN D) 1000 UNITS tablet Take 1,000 Units by mouth every morning.       . diclofenac sodium (VOLTAREN) 1 % GEL Apply 4 g topically 4 (four) times daily. To each knee  3 Tube  2  . furosemide (LASIX) 40 MG tablet Take 40 mg by mouth 2 (two) times daily.      Marland Kitchen gabapentin (NEURONTIN) 300 MG capsule Take 300 mg by mouth 3 (three) times daily.      Marland Kitchen GLUCOSAMINE PO Take 1 tablet by mouth every morning.       Marland Kitchen  isosorbide mononitrate (IMDUR) 60 MG 24 hr tablet Take 60 mg by mouth every morning.       . nitroGLYCERIN (NITROSTAT) 0.4 MG SL tablet Place 0.4 mg under the tongue every 5 (five) minutes x 3 doses as needed for chest pain.       Marland Kitchen omeprazole (PRILOSEC) 40 MG capsule Take 40 mg by mouth every morning.      Marland Kitchen oxyCODONE (OXY IR/ROXICODONE) 5 MG immediate release tablet Take 1 tablet (5 mg total) by mouth 2 (two) times daily. FILL ON OR AFTER 06/01/2013  60 tablet  0  . potassium chloride SA (K-DUR,KLOR-CON) 20 MEQ tablet Take 1 tablet (20 mEq total) by mouth daily. 1/2 tablet daily or 1 tab qOD      . predniSONE (DELTASONE) 20 MG tablet Take 1 tablet (20 mg total) by mouth daily.  30 tablet  2  . tiZANidine (ZANAFLEX) 4 MG tablet Take 1 tablet (4 mg total) by mouth every 6 (six) hours as needed.  40 tablet  0  . [DISCONTINUED] Hyoscyamine Sulfate (HYOMAX-DT) 0.375 MG TBCR Take 1 tab bid for 5 days, then prn abd pain  30 each  1   No current facility-administered medications on file prior to visit.       Objective:   Physical Exam Filed Vitals:    05/26/13 1534  BP: 122/70  Pulse: 50  Temp: 98.9 F (37.2 C)   Gen'l- elderly man in no distress Ext - at the medial left thigh about 6 cm above the knee is a blue streak suggestive of vein rupture with dark bruising that extends down the medial leg to the foot. No heat, no tenderness, no palpable mass. Good arterial circulation.       Assessment & Plan:  Vein rupture - possible spontaneous rupture of a small varicose vein, in the absence of trauma. No evidence of any clot or infection  Plan - time. No treatment is needed. This should resolve on its own.

## 2013-05-26 NOTE — Patient Instructions (Addendum)
1. Vein rupture - possible spontaneous rupture of a small varicose vein, in the absence of trauma. No evidence of any clot or infection  Plan - time. No treatment is needed. This should resolve on its own.   2. Knee pain - advance arthritis.   Plan Ok to continue the prednisone - we want to work down to the lowest possible dose.   OK to continue with the lowest possible dose of Oxycodone  Take a regular laxative to prevent medication related constipation.     Bleeding Varicose Veins Varicose veins are veins that have become enlarged and twisted. Valves in the veins help return blood from the leg to the heart. If these valves are damaged, blood flows backwards and backs up into the veins in the leg near the skin. This causes the veins to become larger because of increased pressure within. Sometimes these veins bleed. CAUSES  Factors that can lead to bleeding varicose veins include:  Thinning of the skin that covers the veins. This skin is stretched as the veins enlarge.  Weak and thinning walls of the varicose veins. These thin walls are part of the reason why blood is not flowing normally to the heart.  Having high pressure in the veins. This high pressure occurs because the blood is not flowing freely back up to the heart.  Injury. Even a small injury to a varicose vein can cause bleeding.  Open wounds. A sore may develop near a varicose vein and not heal. This makes bleeding more likely.  Taking medicine that thins the blood. These medicines may include aspirin, anti-inflammatory medicine, and other blood thinners. SYMPTOMS  If bleeding is on the outside surface of the skin, blood can be seen. Sometimes, the bleeding stays under the skin. If this happens, the blue or purple area will spread beyond the vein. This discoloration may be visible. DIAGNOSIS  To decide if you have a bleeding varicose vein, your caregiver may:  Ask about your symptoms. This will include when you first saw  bleeding.  Ask about how long you have had varicose veins and if they cause you problems.  Ask about your overall health.  Ask about possible causes, like recent cuts or if the area near the varicose veins was bumped or injured.  Examine the skin or leg that concerns you. Your caregiver will probably feel the veins.  Order imaging tests. These create detailed pictures of the veins. TREATMENT  The first goal of treating bleeding varicose veins is to stop the bleeding. Then, the aim is to keep any bleeding from happening again. Treatment will depend on the cause of the bleeding and how bad it is. Ask your caregiver about what would be best for you. Options include:  Raising (elevating) your leg. Lie down with your leg propped up on a pillow or cushion. Your foot should be above your heart.  Applying pressure to the spot that is bleeding. The bleeding should stop in a short time.  Wearing elastic stocking that "compress" your legs (compression stockings). An elastic bandage may do the same thing.  Applying an antibiotic cream on sores that are not healing.  Surgically removing or closing off the bleeding varicose veins. HOME CARE INSTRUCTIONS   Apply any creams that your caregiver prescribed. Follow the directions carefully.  Wear compression stockings or any special wraps that were prescribed. Make sure you know:  If you should wear them every day.  How long you should wear them.  If veins were  removed or closed, a bandage (dressing) will probably cover the area. Make sure you know:  How often the dressing should be changed.  Whether the area can get wet.  When you can leave the skin uncovered.  Check your skin every day. Look for new sores and signs of bleeding.  To prevent future bleeding:  Use extra care in situations where you could cut your legs. Shaving, for example, or working outside in the garden.  Try to keep your legs elevated as much as possible. Lie down when  you can. SEEK MEDICAL CARE IF:   You have any questions about how to wear compression stockings or elastic bandages.  Your veins continue to bleed.  Sores develop near your varicose veins.  You have a sore that does not heal or gets bigger.  Pain increases in your leg.  The area around a varicose vein becomes warm, red, or tender to the touch.  You notice a yellowish fluid that smells bad coming from a spot where there was bleeding.  You develop a fever of more than 100.5 F (38.1 C). SEEK IMMEDIATE MEDICAL CARE IF:   You develop a fever of more than 102 F (38.9 C). Document Released: 01/06/2009 Document Revised: 11/12/2011 Document Reviewed: 10/16/2010 [facility] Patient Information 2014 [place], [place].

## 2013-05-27 NOTE — Assessment & Plan Note (Signed)
Patient treated with prednisone with relief of pain and restoration of normal activity, ADLs. Other treatments have failed. He was on reduced dose of 10 mg daily but had recurrent disabling pain and is back on 20 mg daily. He is tolerating this well.  Plan Continue prednisone therapy.

## 2013-07-01 ENCOUNTER — Other Ambulatory Visit: Payer: Self-pay

## 2013-07-01 MED ORDER — OMEPRAZOLE 40 MG PO CPDR: 40.0000 mg | DELAYED_RELEASE_CAPSULE | Freq: Every morning | ORAL | Status: DC

## 2013-07-06 ENCOUNTER — Telehealth: Payer: Self-pay | Admitting: *Deleted

## 2013-07-06 ENCOUNTER — Other Ambulatory Visit: Payer: Self-pay

## 2013-07-06 MED ORDER — PREDNISONE 20 MG PO TABS: 20.0000 mg | ORAL_TABLET | Freq: Every day | ORAL | Status: DC

## 2013-07-06 NOTE — Telephone Encounter (Signed)
Ok  To refill w/ 3 scripts dated for fill on or after.

## 2013-07-06 NOTE — Telephone Encounter (Signed)
Pt called requesting Oxycodone refill.  Please advise 

## 2013-07-07 MED ORDER — OXYCODONE HCL 5 MG PO TABS: 5.0000 mg | ORAL_TABLET | Freq: Two times a day (BID) | ORAL | Status: DC

## 2013-07-07 MED ORDER — OXYCODONE HCL 5 MG PO TABS
5.0000 mg | ORAL_TABLET | Freq: Two times a day (BID) | ORAL | Status: DC
Start: 2013-07-07 — End: 2013-07-07

## 2013-07-07 NOTE — Telephone Encounter (Signed)
Spoke with [name] advised Rx printed

## 2013-08-07 ENCOUNTER — Other Ambulatory Visit: Payer: Self-pay

## 2013-08-07 MED ORDER — TRAMADOL HCL 50 MG PO TABS: 50.0000 mg | ORAL_TABLET | Freq: Three times a day (TID) | ORAL | Status: DC | PRN

## 2013-08-21 ENCOUNTER — Other Ambulatory Visit: Payer: Self-pay

## 2013-08-21 MED ORDER — OMEPRAZOLE 20 MG PO CPDR: 20.0000 mg | DELAYED_RELEASE_CAPSULE | Freq: Two times a day (BID) | ORAL | Status: DC

## 2013-08-25 ENCOUNTER — Telehealth: Payer: Self-pay

## 2013-08-25 NOTE — Telephone Encounter (Signed)
The patient's daughter called and is hoping to get a call back from the CMA about her father.  Her name is Agustin Cree - callback [phone]

## 2013-08-25 NOTE — Telephone Encounter (Signed)
I called [name] back and she states patient's granddaughter and husband recently moved in his home. It is not a good situation, especially health wise. As his POA she would like you to write up a letter stating the added people in the home is not good for patient's health. She would like to talk to you directly.

## 2013-09-10 ENCOUNTER — Other Ambulatory Visit: Payer: Self-pay

## 2013-09-10 MED ORDER — ISOSORBIDE MONONITRATE ER 60 MG PO TB24: 60.0000 mg | ORAL_TABLET | Freq: Every morning | ORAL | Status: DC

## 2013-09-16 ENCOUNTER — Ambulatory Visit: Payer: Medicare Other | Admitting: Cardiovascular Disease

## 2013-09-22 ENCOUNTER — Ambulatory Visit (INDEPENDENT_AMBULATORY_CARE_PROVIDER_SITE_OTHER): Payer: Medicare HMO | Admitting: *Deleted

## 2013-09-22 ENCOUNTER — Encounter: Payer: Self-pay | Admitting: Cardiovascular Disease

## 2013-09-22 ENCOUNTER — Ambulatory Visit (INDEPENDENT_AMBULATORY_CARE_PROVIDER_SITE_OTHER): Payer: Medicare HMO | Admitting: Cardiovascular Disease

## 2013-09-22 VITALS — BP 139/74 | HR 77 | Ht 64.0 in | Wt 153.0 lb

## 2013-09-22 DIAGNOSIS — I2510 Atherosclerotic heart disease of native coronary artery without angina pectoris: Secondary | ICD-10-CM | POA: Diagnosis not present

## 2013-09-22 DIAGNOSIS — I499 Cardiac arrhythmia, unspecified: Secondary | ICD-10-CM

## 2013-09-22 DIAGNOSIS — E7800 Pure hypercholesterolemia, unspecified: Secondary | ICD-10-CM

## 2013-09-22 DIAGNOSIS — E785 Hyperlipidemia, unspecified: Secondary | ICD-10-CM

## 2013-09-22 DIAGNOSIS — I10 Essential (primary) hypertension: Secondary | ICD-10-CM

## 2013-09-22 LAB — HEPATIC FUNCTION PANEL
ALBUMIN: 3.6 g/dL (ref 3.5–5.2)
ALK PHOS: 49 U/L (ref 39–117)
ALT: 19 U/L (ref 0–53)
AST: 21 U/L (ref 0–37)
Bilirubin, Direct: 0.2 mg/dL (ref 0.0–0.3)
TOTAL PROTEIN: 6.3 g/dL (ref 6.0–8.3)
Total Bilirubin: 1.2 mg/dL (ref 0.3–1.2)

## 2013-09-22 LAB — BASIC METABOLIC PANEL
BUN: 18 mg/dL (ref 6–23)
CALCIUM: 8.9 mg/dL (ref 8.4–10.5)
CHLORIDE: 100 meq/L (ref 96–112)
CO2: 28 mEq/L (ref 19–32)
CREATININE: 1.3 mg/dL (ref 0.4–1.5)
GFR: 56 mL/min — AB (ref >60.00)
Glucose, Bld: 162 mg/dL — ABNORMAL HIGH (ref 70–99)
Potassium: 4 mEq/L (ref 3.5–5.1)
Sodium: 137 mEq/L (ref 135–145)

## 2013-09-22 LAB — LIPID PANEL
CHOLESTEROL: 208 mg/dL — AB (ref 0–200)
HDL: 65.6 mg/dL (ref >39.00)
TRIGLYCERIDES: 128 mg/dL (ref 0.0–149.0)
Total CHOL/HDL Ratio: 3
VLDL: 25.6 mg/dL (ref 0.0–40.0)

## 2013-09-22 LAB — LDL CHOLESTEROL, DIRECT: LDL DIRECT: 128.1 mg/dL

## 2013-09-22 NOTE — Patient Instructions (Signed)
Your physician wants you to follow-up in: 6 MONTHS.  You will receive a reminder letter in the mail two months in advance. If you don't receive a letter, please call our office to schedule the follow-up appointment.  Your physician recommends that you continue on your current medications as directed. Please refer to the Current Medication list given to you today.  

## 2013-09-22 NOTE — Progress Notes (Signed)
[name] Elliott Date of Birth  July 11, 1928 [facility] [address]    [place], [place]  [address] [phone]  Fax  [phone]  Problem list: 1. Moderate coronary artery disease 2. Dementia 3. Peripheral vascular disease  History of Present Illness:  80 year old gentleman with a history of moderate diffuse coronary artery disease. We treated medically. The left anterior descending arteries/ 1st diagonal  stenosis has not was not suitable for PCI.  He complains of being sick in general is weak sensation. He also complains of some left arm pain.   When I saw him last month. He was having some episodes of chest pain. We were trying to decide whether or not these were due to angina. I asked him to take nitroglycerin when he had these episodes of pain. At times the nitroglycerin helps him and at other times the nitroglycerin did not do anything.  When he walks up a hill in his backyard he has significant shortness breath and this "sick feeling".   He has had a partial shoulder replacement for a shoulder fracture.  He has had chronic shoulder pain since that time.  He has chronic knee pain and is considering knee surgery. His office visit was for the purpose of preoperative evaluation.  Sept. 17, 2013- He has no cardiac complaints.  He denies any chest pain or dyspnea.  He still has some left shoulder stiffness from his surgery February 05, 2012. He has had some wheezing and was recently started on steroids and an antibiotic.  He also ws diagnosed with peripheral neurophy   He was prescribed gabapentin but his daughter did not fill the medication because it was too expensive.  She bought some over-the-counter vitamin B12 which seems to be helping a little bit.  November 26, 2012:  [name] Elliott was recently hospitalized last week for peptic ulcer  Disease.  He had vomiting of coffee-ground emesis. He also had blood in his stool.  The  Doctors discontinued his Gabriel Earing powders and  his Fosamax. He seems to be doing well from a cardiac standpoint.  No angina.  He is having trouble with leg pain.    Jan. 20, 2015:  No CP.  He has lots of fatigue especially if he tries to walk any distance.  Current Outpatient Prescriptions on File Prior to Visit  Medication Sig Dispense Refill  . aspirin EC 81 MG tablet Take 81 mg by mouth every morning.       . calcium carbonate (OS-CAL) 600 MG TABS Take 600 mg by mouth every morning.       . cholecalciferol (VITAMIN D) 1000 UNITS tablet Take 1,000 Units by mouth every morning.       . diclofenac sodium (VOLTAREN) 1 % GEL Apply 4 g topically 4 (four) times daily. To each knee  3 Tube  2  . furosemide (LASIX) 40 MG tablet Take 40 mg by mouth 2 (two) times daily.      Marland Kitchen gabapentin (NEURONTIN) 300 MG capsule Take 300 mg by mouth 3 (three) times daily.      Marland Kitchen GLUCOSAMINE PO Take 1 tablet by mouth every morning.       . isosorbide mononitrate (IMDUR) 60 MG 24 hr tablet Take 1 tablet (60 mg total) by mouth every morning.  90 tablet  1  . nitroGLYCERIN (NITROSTAT) 0.4 MG SL tablet Place 0.4 mg under the tongue every 5 (five) minutes x 3 doses as needed for chest pain.       Marland Kitchen  omeprazole (PRILOSEC) 20 MG capsule Take 1 capsule (20 mg total) by mouth 2 (two) times daily before a meal.  180 capsule  3  . oxyCODONE (OXY IR/ROXICODONE) 5 MG immediate release tablet Take 1 tablet (5 mg total) by mouth 2 (two) times daily. To be filled on or after September 05, 2013  60 tablet  0  . potassium chloride SA (K-DUR,KLOR-CON) 20 MEQ tablet Take 1 tablet (20 mEq total) by mouth daily. 1/2 tablet daily or 1 tab qOD      . predniSONE (DELTASONE) 20 MG tablet Take 1 tablet (20 mg total) by mouth daily.  30 tablet  5  . tiZANidine (ZANAFLEX) 4 MG tablet Take 1 tablet (4 mg total) by mouth every 6 (six) hours as needed.  40 tablet  0  . traMADol (ULTRAM) 50 MG tablet Take 1 tablet (50 mg total) by mouth every 8 (eight) hours as needed.  30 tablet  0  .  [DISCONTINUED] Hyoscyamine Sulfate (HYOMAX-DT) 0.375 MG TBCR Take 1 tab bid for 5 days, then prn abd pain  30 each  1   No current facility-administered medications on file prior to visit.    Allergies  Allergen Reactions  . Crestor [Rosuvastatin Calcium] Other (See Comments)    Muscle pain/aches    Past Medical History  Diagnosis Date  . Constipation, chronic   . Sebaceous cyst   . Other malaise and fatigue   . Pain in joint, shoulder region     Has chronic shoulder pain  . Persistent disorder of initiating or maintaining sleep   . Thrombocytopenia, unspecified   . Dementia     with periods of amnesia  . Other B-complex deficiencies   . Hypothyroidism   . Pain in limb   . Esophageal reflux   . Unspecified essential hypertension   . Arthropathy, unspecified, site unspecified   . Pure hypercholesterolemia   . Orthostasis   . PVC's (premature ventricular contractions)   . S/P cardiac cath 08/08/11    mild to moderate CAD primarily in the LAD. None are obstructive and appear stable from prior cath in 2007; managed medically  . Arthritis   . CAD (coronary artery disease)     last cath in 2012. Managed medically-some blockages  . Headache(784.0)   . 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 MRI Nov '11 - tendonosis and partial tear. Left shoulder surgery Jan '12 for partial shoulder replacement. [name])   . Blood dyscrasia     thromboctopenia  . Pneumonia 1980's?; 2011  . History of stomach ulcers 11/2012  . Chronic lower back pain     Past Surgical History  Procedure Laterality Date  . Hand surgery Right     crush injury, right fifth digit contracture, limited  motion  . Lumbar spine surgery  2007    Dr  Aspen ([place])  . Knee surgery Left 1991    "did it twice in 1 wk" (02/17/2013)  . Cataract extraction Left 2009  . Shoulder open rotator cuff repair Left 8.30.2011  . US echocardiography  02-28-2010    Est EF 50-55%  . Cardiac  catheterization  08/2011  . Shoulder hemi-arthroplasty    . Finger amputation Right     pinky finger  . Hardware removal  02/05/2012    Procedure: HARDWARE REMOVAL;  Surgeon: Johnny Bridge, MD;  Location: [facility];  Service: Orthopedics;  Laterality: Left;  . Reverse shoulder arthroplasty  02/05/2012    Procedure: REVERSE SHOULDER  ARTHROPLASTY;  Surgeon: Johnny Bridge, MD;  Location: [facility];  Service: Orthopedics;  Laterality: Left;  . Back surgery    . Esophagogastroduodenoscopy N/A 11/19/2012    Procedure: ESOPHAGOGASTRODUODENOSCOPY (EGD);  Surgeon: Jerene Bears, MD;  Location: [facility];  Service: Gastroenterology;  Laterality: N/A;    History  Smoking status  . Former Smoker  . Types: Cigars  . Quit date: 09/03/1978  Smokeless tobacco  . Current User  . Types: Chew    Comment: 02/17/2013 "I aien't smoked a cigar in 20-30 yr"    History  Alcohol Use No    Family History  Problem Relation Age of Onset  . Breast cancer Mother   . Cancer Brother     throat  . Diabetes Daughter   . Colon cancer Neg Hx   . Kidney disease Father     Reviw of Systems:  Reviewed in the HPI.  All other systems are negative.  Physical Exam: There were no vitals taken for this visit. The patient is alert and oriented x 3.  The mood and affect are normal.   Skin: warm and dry.  Color is normal.    HEENT:   His neck is supple. His carotids are 2+. There is no JVD  Lungs: His lungs are clear.   Heart: Regular rate, S1-S2.  Occasional premature beats   Abdomen: His abdomen is soft gait is good bowel sounds. There is no hepatosplenomegaly.  Extremities:  He has no clubbing cyanosis or edema  Neuro:  His gait is a bit unsteady. He tends to shuffle his feet. His motor and sensory are basically intact.     ECG: Jan. 20, 2015:  NSR at 77, occasional PVCs.  TWI in the lateral leads.   No significant changes from previous tracing. Assessment / Plan:

## 2013-09-22 NOTE — Assessment & Plan Note (Signed)
[name] is doing well. He's not having any episodes of angina.   He does have frequent episodes of generalized weakness with exertion. I suspect this is age related.  He also has at least moderate dementia. This certainly has some effect on his activity.   I will see him in 6 months.

## 2013-09-24 ENCOUNTER — Encounter: Payer: Self-pay | Admitting: Internal Medicine

## 2013-09-24 ENCOUNTER — Telehealth: Payer: Self-pay | Admitting: Cardiovascular Disease

## 2013-09-24 ENCOUNTER — Ambulatory Visit (INDEPENDENT_AMBULATORY_CARE_PROVIDER_SITE_OTHER): Payer: Medicare HMO | Admitting: Internal Medicine

## 2013-09-24 VITALS — BP 110/66 | HR 73 | Temp 97.0°F | Wt 159.0 lb

## 2013-09-24 DIAGNOSIS — E538 Deficiency of other specified B group vitamins: Secondary | ICD-10-CM

## 2013-09-24 DIAGNOSIS — R5381 Other malaise: Secondary | ICD-10-CM

## 2013-09-24 DIAGNOSIS — E785 Hyperlipidemia, unspecified: Secondary | ICD-10-CM

## 2013-09-24 DIAGNOSIS — E039 Hypothyroidism, unspecified: Secondary | ICD-10-CM

## 2013-09-24 DIAGNOSIS — R5383 Other fatigue: Principal | ICD-10-CM

## 2013-09-24 MED ORDER — LOVASTATIN 20 MG PO TABS: 20.0000 mg | ORAL_TABLET | Freq: Every day | ORAL | Status: DC

## 2013-09-24 NOTE — Patient Instructions (Signed)
PUNY - not sure why., Heart is ok per Dr. Acie Fredrickson. General labs were ok except blood sugar was mildly elevated. Thyroid not checked since 2011. PHysical exam today is normal except for the stiffness.   Recent Results (from the past 2160 hour(s))  BASIC METABOLIC PANEL     Status: Abnormal   Collection Time    09/22/13  8:15 AM      Result Value Range   Sodium 137  135 - 145 mEq/L   Potassium 4.0  3.5 - 5.1 mEq/L   Chloride 100  96 - 112 mEq/L   CO2 28  19 - 32 mEq/L   Glucose, Bld 162 (*) 70 - 99 mg/dL   BUN 18  6 - 23 mg/dL   Creatinine, Ser 1.3  0.4 - 1.5 mg/dL   Calcium 8.9  8.4 - 10.5 mg/dL   GFR 56.00 (*) >60.00 mL/min  LIPID PANEL     Status: Abnormal   Collection Time    09/22/13  8:15 AM      Result Value Range   Cholesterol 208 (*) 0 - 200 mg/dL   Comment: ATP III Classification       Desirable:  < 200 mg/dL               Borderline High:  200 - 239 mg/dL          High:  > = 240 mg/dL   Triglycerides 128.0  0.0 - 149.0 mg/dL   Comment: Normal:  <150 mg/dLBorderline High:  150 - 199 mg/dL   HDL 65.60  >39.00 mg/dL   VLDL 25.6  0.0 - 40.0 mg/dL   Total CHOL/HDL Ratio 3     Comment:                Men          Women1/2 Average Risk     3.4          3.3Average Risk          5.0          4.42X Average Risk          9.6          7.13X Average Risk          15.0          11.0                      HEPATIC FUNCTION PANEL     Status: None   Collection Time    09/22/13  8:15 AM      Result Value Range   Total Bilirubin 1.2  0.3 - 1.2 mg/dL   Bilirubin, Direct 0.2  0.0 - 0.3 mg/dL   Alkaline Phosphatase 49  39 - 117 U/L   AST 21  0 - 37 U/L   ALT 19  0 - 53 U/L   Total Protein 6.3  6.0 - 8.3 g/dL   Albumin 3.6  3.5 - 5.2 g/dL  LDL CHOLESTEROL, DIRECT     Status: None   Collection Time    09/22/13  8:15 AM      Result Value Range   Direct LDL 128.1     Comment: Optimal:  <100 mg/dLNear or Above Optimal:  100-129 mg/dLBorderline High:  130-159 mg/dLHigh:  160-189 mg/dLVery High:   >190 mg/dL   Plan Please come back for lab work - will check for diabetes, will check your thyroid function,  Continue all  your present medications

## 2013-09-24 NOTE — Progress Notes (Signed)
Pre visit review using our clinic review tool, if applicable. No additional management support is needed unless otherwise documented below in the visit note. 

## 2013-09-24 NOTE — Assessment & Plan Note (Signed)
Lab Results  Component Value Date   TSH 4.37 07/20/2010   Pattern of rising TSH - may be a cause of malaise and fatigue  Plan TSH, FT4 with recommendations to follow

## 2013-09-24 NOTE — Telephone Encounter (Signed)
Message copied by Jonathon Jordan on Thu Sep 24, 2013  9:29 AM ------      Message from: Thayer Headings      Created: Tue Sep 22, 2013  9:29 PM       Intolerant to crestor.   Try mevacor 20 mg a day.  Recheck fasting labs in 3 months       ------

## 2013-09-24 NOTE — Assessment & Plan Note (Signed)
Lab Results  Component Value Date   VITAMINB12 111* 04/29/2012   Due for B12 level with recommendations to follow

## 2013-09-24 NOTE — Telephone Encounter (Signed)
Please is returning your call, please call back.

## 2013-09-24 NOTE — Telephone Encounter (Signed)
Daughter agreed to plan, 3 month lab date given. Daughter states [name] has not felt well, c/o vision changes. Pt was fasting on last lab draw and glucose was 162, she was told to call pcp to get him in for an appointment today if she can. She agreed to plan/ I will forward labs.

## 2013-09-24 NOTE — Progress Notes (Signed)
Subjective:    Patient ID: [name] Elliott, male    DOB: 1928/03/11, 80 y.o.   MRN: [id number]  HPI Mr. Space presents for evaluation of progressive malaise, fatigue and loss of energy. He was seen by Dr. Acie Fredrickson Jan 20th - note and labs reviewed. He was thought to be stable w/o exertional angina or a cardiac etiology of his symptoms. Chart reviewed - h/o B12 deficiency, rising TSH - last checked in 2011 and rising serum glucose with last value of 162 and last A1C in 2011 of 6.4%. He is a poor historian and does not give specific symptoms in regard to his loss of energy.  PMH, FamHx and SocHx reviewed for any changes and relevance. Current Outpatient Prescriptions on File Prior to Visit  Medication Sig Dispense Refill  . aspirin EC 81 MG tablet Take 81 mg by mouth every morning.       . calcium carbonate (OS-CAL) 600 MG TABS Take 600 mg by mouth every morning.       . cholecalciferol (VITAMIN D) 1000 UNITS tablet Take 1,000 Units by mouth every morning.       . diclofenac sodium (VOLTAREN) 1 % GEL Apply 4 g topically 4 (four) times daily. To each knee  3 Tube  2  . furosemide (LASIX) 40 MG tablet Take 40 mg by mouth 2 (two) times daily.      Marland Kitchen gabapentin (NEURONTIN) 300 MG capsule Take 300 mg by mouth 3 (three) times daily.      Marland Kitchen GLUCOSAMINE PO Take 1 tablet by mouth every morning.       . isosorbide mononitrate (IMDUR) 60 MG 24 hr tablet Take 1 tablet (60 mg total) by mouth every morning.  90 tablet  1  . lovastatin (MEVACOR) 20 MG tablet Take 1 tablet (20 mg total) by mouth at bedtime.  30 tablet  6  . omeprazole (PRILOSEC) 20 MG capsule Take 1 capsule (20 mg total) by mouth 2 (two) times daily before a meal.  180 capsule  3  . oxyCODONE (OXY IR/ROXICODONE) 5 MG immediate release tablet Take 1 tablet (5 mg total) by mouth 2 (two) times daily. To be filled on or after September 05, 2013  60 tablet  0  . potassium chloride SA (K-DUR,KLOR-CON) 20 MEQ tablet Take 1 tablet (20 mEq total) by mouth  daily. 1/2 tablet daily or 1 tab qOD      . predniSONE (DELTASONE) 20 MG tablet Take 1 tablet (20 mg total) by mouth daily.  30 tablet  5  . traMADol (ULTRAM) 50 MG tablet Take 1 tablet (50 mg total) by mouth every 8 (eight) hours as needed.  30 tablet  0  . [DISCONTINUED] Hyoscyamine Sulfate (HYOMAX-DT) 0.375 MG TBCR Take 1 tab bid for 5 days, then prn abd pain  30 each  1   No current facility-administered medications on file prior to visit.      Review of Systems System review is negative for any constitutional, cardiac, pulmonary, GI or neuro symptoms or complaints other than as described in the HPI.     Objective:   Physical Exam Filed Vitals:   09/24/13 1647  BP: 110/66  Pulse: 73  Temp: 97 F (36.1 C)   Wt Readings from Last 3 Encounters:  09/24/13 159 lb (72.122 kg)  09/22/13 153 lb (69.4 kg)  05/26/13 157 lb 12.8 oz (71.578 kg)   BP Readings from Last 3 Encounters:  09/24/13 110/66  09/22/13 139/74  05/26/13 122/70  Gen'l - WNWD man in no distress HEENT - [place]/AT, C&S clear Neck - supple, w/o thyromegaly Nodes - neg cervical region Cor 2+ radial, 2+ DP; quiet precordium, RRR Pulm - normal respirations, lungs - CTAP Abd - soft Neuro - awake and alert, speech clear, cognition/mentation at his baseline; normal "get up and go," normal gait.       Assessment & Plan:  Malaise and fatigue - multiple possible etiologies including progressive hyperglycemia.  Plan Labs as ordered including A1C

## 2013-09-25 ENCOUNTER — Other Ambulatory Visit (INDEPENDENT_AMBULATORY_CARE_PROVIDER_SITE_OTHER): Payer: Medicare HMO

## 2013-09-25 DIAGNOSIS — R5381 Other malaise: Secondary | ICD-10-CM

## 2013-09-25 DIAGNOSIS — R5383 Other fatigue: Secondary | ICD-10-CM

## 2013-09-25 DIAGNOSIS — E785 Hyperlipidemia, unspecified: Secondary | ICD-10-CM

## 2013-09-25 LAB — HEPATIC FUNCTION PANEL
ALBUMIN: 3.6 g/dL (ref 3.5–5.2)
ALK PHOS: 53 U/L (ref 39–117)
ALT: 20 U/L (ref 0–53)
AST: 20 U/L (ref 0–37)
Bilirubin, Direct: 0.2 mg/dL (ref 0.0–0.3)
Total Bilirubin: 1 mg/dL (ref 0.3–1.2)
Total Protein: 6.4 g/dL (ref 6.0–8.3)

## 2013-09-25 LAB — BASIC METABOLIC PANEL
BUN: 13 mg/dL (ref 6–23)
CALCIUM: 9.2 mg/dL (ref 8.4–10.5)
CHLORIDE: 99 meq/L (ref 96–112)
CO2: 26 mEq/L (ref 19–32)
CREATININE: 1.3 mg/dL (ref 0.4–1.5)
GFR: 57.53 mL/min — AB (ref >60.00)
Glucose, Bld: 256 mg/dL — ABNORMAL HIGH (ref 70–99)
Potassium: 3.9 mEq/L (ref 3.5–5.1)
Sodium: 135 mEq/L (ref 135–145)

## 2013-09-25 LAB — LIPID PANEL
CHOL/HDL RATIO: 4
CHOLESTEROL: 216 mg/dL — AB (ref 0–200)
HDL: 61 mg/dL (ref >39.00)
TRIGLYCERIDES: 176 mg/dL — AB (ref 0.0–149.0)
VLDL: 35.2 mg/dL (ref 0.0–40.0)

## 2013-09-25 LAB — TSH: TSH: 4.63 u[IU]/mL (ref 0.35–5.50)

## 2013-09-25 LAB — HEMOGLOBIN A1C: Hgb A1c MFr Bld: 11.4 % — ABNORMAL HIGH (ref 4.6–6.5)

## 2013-09-25 LAB — VITAMIN B12: VITAMIN B 12: 1474 pg/mL — AB (ref 211–911)

## 2013-09-25 LAB — LDL CHOLESTEROL, DIRECT: Direct LDL: 140.6 mg/dL

## 2013-09-25 LAB — T4, FREE: Free T4: 1.17 ng/dL (ref 0.60–1.60)

## 2013-09-27 ENCOUNTER — Encounter: Payer: Self-pay | Admitting: Internal Medicine

## 2013-09-27 ENCOUNTER — Other Ambulatory Visit: Payer: Self-pay | Admitting: Internal Medicine

## 2013-09-27 MED ORDER — GLIMEPIRIDE 2 MG PO TABS: 2.0000 mg | ORAL_TABLET | Freq: Every day | ORAL | Status: DC

## 2013-09-28 ENCOUNTER — Telehealth: Payer: Self-pay

## 2013-09-28 NOTE — Telephone Encounter (Signed)
Message copied by Shelly Coss on Mon Sep 28, 2013  9:30 AM ------      Message from: Neena Rhymes      Created: Sun Sep 27, 2013 12:03 PM       Please call mr. Trautner's daughter - he has out of control diabetes which could be part of the problem. He will need to start on medication - generic amyrl 2 mg once a day and he will need to be on a no sugar diet.       Rx has been set to pharmacy. ------

## 2013-09-28 NOTE — Telephone Encounter (Signed)
Left msg to call back.

## 2013-09-28 NOTE — Telephone Encounter (Signed)
Left a voicemail message for daughter, [name], to return call.

## 2013-09-29 ENCOUNTER — Telehealth: Payer: Self-pay | Admitting: Cardiovascular Disease

## 2013-09-29 ENCOUNTER — Ambulatory Visit: Payer: Medicare HMO | Admitting: Internal Medicine

## 2013-09-29 ENCOUNTER — Telehealth: Payer: Self-pay

## 2013-09-29 NOTE — Telephone Encounter (Signed)
The patient's daughter called the triage line on 09/28/13 and is hoping to get the results of the patient's blood work.  Her callback - [phone]

## 2013-09-29 NOTE — Telephone Encounter (Signed)
Patient daughter [name] has been notified of results. See previous phone note.

## 2013-09-29 NOTE — Telephone Encounter (Signed)
i see another office called them/ advised them to call pcp. She agreed to plan.

## 2013-09-29 NOTE — Telephone Encounter (Signed)
New problem   Pt returning a call from a nurse concerning labs. Stated no one let their name.

## 2013-09-29 NOTE — Telephone Encounter (Signed)
Patient's daughter, [name], has been notified

## 2013-10-06 ENCOUNTER — Encounter: Payer: Self-pay | Admitting: Internal Medicine

## 2013-10-06 ENCOUNTER — Ambulatory Visit (INDEPENDENT_AMBULATORY_CARE_PROVIDER_SITE_OTHER)
Admission: RE | Admit: 2013-10-06 | Discharge: 2013-10-06 | Disposition: A | Discharge status: Home / Self Care | Payer: Medicare HMO | Source: Ambulatory Visit | Attending: Internal Medicine | Admitting: Internal Medicine

## 2013-10-06 ENCOUNTER — Ambulatory Visit (INDEPENDENT_AMBULATORY_CARE_PROVIDER_SITE_OTHER): Payer: Medicare HMO | Admitting: Internal Medicine

## 2013-10-06 ENCOUNTER — Other Ambulatory Visit (INDEPENDENT_AMBULATORY_CARE_PROVIDER_SITE_OTHER): Payer: Medicare HMO

## 2013-10-06 VITALS — BP 120/80 | HR 66 | Temp 97.9°F | Wt 154.0 lb

## 2013-10-06 DIAGNOSIS — R05 Cough: Secondary | ICD-10-CM

## 2013-10-06 DIAGNOSIS — I519 Heart disease, unspecified: Secondary | ICD-10-CM

## 2013-10-06 DIAGNOSIS — E538 Deficiency of other specified B group vitamins: Secondary | ICD-10-CM

## 2013-10-06 DIAGNOSIS — IMO0001 Reserved for inherently not codable concepts without codable children: Secondary | ICD-10-CM

## 2013-10-06 DIAGNOSIS — I5189 Other ill-defined heart diseases: Secondary | ICD-10-CM

## 2013-10-06 DIAGNOSIS — R059 Cough, unspecified: Secondary | ICD-10-CM

## 2013-10-06 DIAGNOSIS — I10 Essential (primary) hypertension: Secondary | ICD-10-CM

## 2013-10-06 DIAGNOSIS — E1165 Type 2 diabetes mellitus with hyperglycemia: Secondary | ICD-10-CM

## 2013-10-06 LAB — BRAIN NATRIURETIC PEPTIDE: PRO B NATRI PEPTIDE: 109 pg/mL — AB (ref 0.0–100.0)

## 2013-10-06 MED ORDER — CYANOCOBALAMIN 1000 MCG/ML IJ SOLN
1000.0000 ug | Freq: Once | INTRAMUSCULAR | Status: AC
  Administered 2013-10-06: 1000 ug via INTRAMUSCULAR

## 2013-10-06 MED ORDER — PROMETHAZINE-CODEINE 6.25-10 MG/5ML PO SYRP: 5.0000 mL | ORAL_SOLUTION | ORAL | Status: DC | PRN

## 2013-10-06 NOTE — Progress Notes (Signed)
Subjective:    Patient ID: [name] Elliott, male    DOB: [birth year], 80 y.o.   MRN: [id number]  HPI [name] Elliott presents for c/o cough that is persistent, productive of clear sputum. He denies any fever, he is short of breath, the cough keeps him up at night.   At his last visit labs revealed him to be an out of control diabetic with an A1C 11.4 % and he was started on amaryl 2 mg and a no sugar diet. He was also B12 deficient at 111 and he needs to start B12 replacement.  Past Medical History  Diagnosis Date  . Constipation, chronic   . Sebaceous cyst   . Other malaise and fatigue   . Pain in joint, shoulder region     Has chronic shoulder pain  . Persistent disorder of initiating or maintaining sleep   . Thrombocytopenia, unspecified   . Dementia     with periods of amnesia  . Other B-complex deficiencies   . Hypothyroidism   . Pain in limb   . Esophageal reflux   . Unspecified essential hypertension   . Arthropathy, unspecified, site unspecified   . Pure hypercholesterolemia   . Orthostasis   . PVC's (premature ventricular contractions)   . S/P cardiac cath 08/08/11    mild to moderate CAD primarily in the LAD. None are obstructive and appear stable from prior cath in 2007; managed medically  . Arthritis   . CAD (coronary artery disease)     last cath in 2012. Managed medically-some blockages  . Headache(784.0)   . 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 MRI Nov '11 - tendonosis and partial tear. Left shoulder surgery Jan '12 for partial shoulder replacement. [name])   . Blood dyscrasia     thromboctopenia  . Pneumonia 1980's?; 2011  . History of stomach ulcers 11/2012  . Chronic lower back pain    Past Surgical History  Procedure Laterality Date  . Hand surgery Right     crush injury, right fifth digit contracture, limited  motion  . Lumbar spine surgery  2007    Dr Philip Aspen ([place])  . Knee surgery Left 1991    "did it twice in  1 wk" (02/17/2013)  . Cataract extraction Left 2009  . Shoulder open rotator cuff repair Left 8.30.2011  . US echocardiography  02-28-2010    Est EF 50-55%  . Cardiac catheterization  08/2011  . Shoulder hemi-arthroplasty    . Finger amputation Right     pinky finger  . Hardware removal  02/05/2012    Procedure: HARDWARE REMOVAL;  Surgeon: Johnny Bridge, MD;  Location: [facility];  Service: Orthopedics;  Laterality: Left;  . Reverse shoulder arthroplasty  02/05/2012    Procedure: REVERSE SHOULDER ARTHROPLASTY;  Surgeon: Johnny Bridge, MD;  Location: [facility];  Service: Orthopedics;  Laterality: Left;  . Back surgery    . Esophagogastroduodenoscopy N/A 11/19/2012    Procedure: ESOPHAGOGASTRODUODENOSCOPY (EGD);  Surgeon: Jerene Bears, MD;  Location: [facility];  Service: Gastroenterology;  Laterality: N/A;   Family History  Problem Relation Age of Onset  . Breast cancer Mother   . Cancer Brother     throat  . Diabetes Daughter   . Colon cancer Neg Hx   . Kidney disease Father    History   Social History  . Marital Status: Widowed    Spouse Name: N/A    Number of Children: 2  . Years of Education:  N/A   Occupational History  . retired    Social History Main Topics  . Smoking status: Former Smoker    Types: Cigars    Quit date: 09/03/1978  . Smokeless tobacco: Current User    Types: Chew     Comment: 02/17/2013 "I aien't smoked a cigar in 20-30 yr"  . Alcohol Use: No  . Drug Use: No  . Sexual Activity: No   Other Topics Concern  . Not on file   Social History Narrative   2nd grade education. Married '58, '95. 2 dtr '59, '64, youngest daughter died septic kidney. Lives alone, handles all ADLs              Current Outpatient Prescriptions on File Prior to Visit  Medication Sig Dispense Refill  . aspirin EC 81 MG tablet Take 81 mg by mouth every morning.       . calcium carbonate (OS-CAL) 600 MG TABS Take 600 mg by mouth every morning.       . cholecalciferol (VITAMIN D)  1000 UNITS tablet Take 1,000 Units by mouth every morning.       . diclofenac sodium (VOLTAREN) 1 % GEL Apply 4 g topically 4 (four) times daily. To each knee  3 Tube  2  . furosemide (LASIX) 40 MG tablet Take 40 mg by mouth 2 (two) times daily.      Marland Kitchen gabapentin (NEURONTIN) 300 MG capsule Take 300 mg by mouth 3 (three) times daily.      Marland Kitchen glimepiride (AMARYL) 2 MG tablet Take 1 tablet (2 mg total) by mouth daily before breakfast.  30 tablet  3  . GLUCOSAMINE PO Take 1 tablet by mouth every morning.       . isosorbide mononitrate (IMDUR) 60 MG 24 hr tablet Take 1 tablet (60 mg total) by mouth every morning.  90 tablet  1  . lovastatin (MEVACOR) 20 MG tablet Take 1 tablet (20 mg total) by mouth at bedtime.  30 tablet  6  . omeprazole (PRILOSEC) 20 MG capsule Take 1 capsule (20 mg total) by mouth 2 (two) times daily before a meal.  180 capsule  3  . oxyCODONE (OXY IR/ROXICODONE) 5 MG immediate release tablet Take 1 tablet (5 mg total) by mouth 2 (two) times daily. To be filled on or after September 05, 2013  60 tablet  0  . potassium chloride SA (K-DUR,KLOR-CON) 20 MEQ tablet Take 1 tablet (20 mEq total) by mouth daily. 1/2 tablet daily or 1 tab qOD      . predniSONE (DELTASONE) 20 MG tablet Take 1 tablet (20 mg total) by mouth daily.  30 tablet  5  . traMADol (ULTRAM) 50 MG tablet Take 1 tablet (50 mg total) by mouth every 8 (eight) hours as needed.  30 tablet  0  . [DISCONTINUED] Hyoscyamine Sulfate (HYOMAX-DT) 0.375 MG TBCR Take 1 tab bid for 5 days, then prn abd pain  30 each  1   No current facility-administered medications on file prior to visit.      Review of Systems System review is negative for any constitutional, cardiac, pulmonary, GI or neuro symptoms or complaints other than as described in the HPI.     Objective:   Physical Exam Filed Vitals:   10/06/13 0831  BP: 120/80  Pulse: 66  Temp: 97.9 F (36.6 C)   Wt Readings from Last 3 Encounters:  10/06/13 154 lb (69.854 kg)    09/24/13 159 lb (72.122 kg)  09/22/13 153 lb (69.4 kg)   Gen'l- elderly man in no acute distress HEENT- C&S clear Cor 2+ radial pulse, no JVD, quiet precordium, RRR Pulm - no increased WOB, rales at bases, right > left, expiratory wheezing.  Neuro - A&O , reasonable memory, normal gait.       Assessment & Plan:  Cough - no fever, no productive sputum, denies being short of breath. Generally feels weak. Exam notable for bibasilar rales  Plan CXR 2 view  Lab - BNP  Symptomatic treatment with cough syrup  Other treatment based on results.  Addendum - CXR w/o active disease: IMPRESSION:  No active cardiopulmonary disease. 6.2 mm nodule in the lateral  right mid to lower lung. Further evaluation with CT chest is  Recommended.  Lab - BNP - normal. No evidence of heart failure  Pt's daughter to be notified of findings - will schedule CT chest after she is informed.

## 2013-10-06 NOTE — Patient Instructions (Addendum)
1. Diabetes - continue the amaryl every day; no sugar low carb diet  2. B12 deficiency - will give first monthly B12 shot today  3. Cough - no obvious sign of infection but there is a sound of fluid in the lung and some wheezing. Plan Chest x-ray  Lab work: blood count, BNP -test for fluid build up  Cough syrup - codeine with phenergan  Additional instructions based on the test and lab results

## 2013-10-06 NOTE — Progress Notes (Signed)
Pre visit review using our clinic review tool, if applicable. No additional management support is needed unless otherwise documented below in the visit note. 

## 2013-10-07 ENCOUNTER — Telehealth: Payer: Self-pay

## 2013-10-07 DIAGNOSIS — R911 Solitary pulmonary nodule: Secondary | ICD-10-CM

## 2013-10-07 DIAGNOSIS — E1129 Type 2 diabetes mellitus with other diabetic kidney complication: Secondary | ICD-10-CM | POA: Insufficient documentation

## 2013-10-07 NOTE — Assessment & Plan Note (Signed)
On replacement therapy 

## 2013-10-07 NOTE — Telephone Encounter (Signed)
Message copied by Shelly Coss on Wed Oct 07, 2013  9:19 AM ------      Message from: Neena Rhymes      Created: Tue Oct 06, 2013  6:17 PM       Call patient's daughter - Chests xray - no pneumonia, fluid or bronchitis. There is a small 6.2 mm nodule in the right mid to lower lung field. Radiology recommends a CT scan of the chest to better define this nodule. Let me know if she wants [place] to schedule this. No need for antibiotics. ------

## 2013-10-07 NOTE — Assessment & Plan Note (Signed)
Cough - no fever, no productive sputum, denies being short of breath. Generally feels weak. Exam notable for bibasilar rales  Plan CXR 2 view  Lab - BNP  Symptomatic treatment with cough syrup  Other treatment based on results.  Addendum - CXR w/o active disease: IMPRESSION:  No active cardiopulmonary disease. 6.2 mm nodule in the lateral  right mid to lower lung. Further evaluation with CT chest is  Recommended.  Lab - BNP - normal. No evidence of heart failure  Pt's daughter to be notified of findings - will schedule CT chest after she is informed. Pulmonary consult following CT chest.

## 2013-10-07 NOTE — Assessment & Plan Note (Signed)
Cough with rales. BNP normal. Remain concerned for progressive diastolic dysfunction.  Plan 2 D echo.

## 2013-10-07 NOTE — Telephone Encounter (Signed)
Daughter has been notified and would like the CT set up

## 2013-10-07 NOTE — Assessment & Plan Note (Signed)
Patient has been started on oral medication - amaryl 2 mg, cost effective for him. He is also trying to follow a no sugar and low carb diet.

## 2013-10-07 NOTE — Assessment & Plan Note (Signed)
BP Readings from Last 3 Encounters:  10/06/13 120/80  09/24/13 110/66  09/22/13 139/74   Good control on diuretic alone.

## 2013-10-09 ENCOUNTER — Telehealth: Payer: Self-pay | Admitting: *Deleted

## 2013-10-09 NOTE — Telephone Encounter (Signed)
Spouse phoned, stating that someone was supposed to be contacting her in regards to a  CT PCP had ordered earlier in the week.  States her cell phone isn't working and to please contact her at her work # [phone] ext [phone] (spouse-[name])

## 2013-10-16 ENCOUNTER — Other Ambulatory Visit: Payer: Self-pay | Admitting: Internal Medicine

## 2013-10-16 NOTE — Telephone Encounter (Signed)
Dr Ronnald Ramp do you mind okaying this in Dr Linda Hedges absence ? thanks

## 2013-10-20 ENCOUNTER — Other Ambulatory Visit: Payer: Medicare HMO

## 2013-10-23 ENCOUNTER — Ambulatory Visit (INDEPENDENT_AMBULATORY_CARE_PROVIDER_SITE_OTHER)
Admission: RE | Admit: 2013-10-23 | Discharge: 2013-10-23 | Disposition: A | Discharge status: Home / Self Care | Payer: Medicare HMO | Source: Ambulatory Visit | Attending: Internal Medicine | Admitting: Internal Medicine

## 2013-10-23 ENCOUNTER — Telehealth: Payer: Self-pay | Admitting: *Deleted

## 2013-10-23 ENCOUNTER — Other Ambulatory Visit (HOSPITAL_COMMUNITY): Payer: Medicare HMO

## 2013-10-23 DIAGNOSIS — R911 Solitary pulmonary nodule: Secondary | ICD-10-CM

## 2013-10-23 MED ORDER — IOHEXOL 300 MG/ML  SOLN: 80.0000 mL | Freq: Once | INTRAMUSCULAR | Status: AC | PRN

## 2013-10-23 NOTE — Telephone Encounter (Signed)
Spouse phoned requesting that pharmacy be changed to [facility]. Changed, attempted to contact her to notify, left message on voicemail.   CB#[phone]

## 2013-10-25 ENCOUNTER — Encounter: Payer: Self-pay | Admitting: Internal Medicine

## 2013-10-27 ENCOUNTER — Other Ambulatory Visit (HOSPITAL_COMMUNITY): Payer: Medicare HMO

## 2013-10-28 ENCOUNTER — Telehealth: Payer: Self-pay | Admitting: *Deleted

## 2013-10-28 NOTE — Telephone Encounter (Signed)
[name] phoned requesting CT results 10/07/13.  Please advise  CB# [phone]

## 2013-10-28 NOTE — Telephone Encounter (Signed)
Phoned [name] and relayed MD's response

## 2013-10-28 NOTE — Telephone Encounter (Signed)
Letter done 2/22, mailed 2/23. Normal study - no nodule

## 2013-11-02 ENCOUNTER — Telehealth: Payer: Self-pay | Admitting: *Deleted

## 2013-11-02 NOTE — Telephone Encounter (Signed)
That would be his daughter and POA. [facility] for one month supply of gabapentin at current dose

## 2013-11-02 NOTE — Telephone Encounter (Signed)
Patient spouse phoned requesting gabapentin refill sent to local pharmacy until mail order pharmacy issue resolved.  CB# [phone]

## 2013-11-03 MED ORDER — GABAPENTIN 300 MG PO CAPS: 300.0000 mg | ORAL_CAPSULE | Freq: Three times a day (TID) | ORAL | Status: DC

## 2013-11-03 NOTE — Telephone Encounter (Signed)
Erx gabapentin, per MD and notified daughter.

## 2013-11-05 ENCOUNTER — Telehealth: Payer: Self-pay | Admitting: *Deleted

## 2013-11-05 ENCOUNTER — Other Ambulatory Visit: Payer: Self-pay | Admitting: *Deleted

## 2013-11-05 MED ORDER — FUROSEMIDE 40 MG PO TABS: 40.0000 mg | ORAL_TABLET | Freq: Two times a day (BID) | ORAL | Status: DC

## 2013-11-05 MED ORDER — POTASSIUM CHLORIDE CRYS ER 20 MEQ PO TBCR: 20.0000 meq | EXTENDED_RELEASE_TABLET | Freq: Every day | ORAL | Status: DC

## 2013-11-05 MED ORDER — ISOSORBIDE MONONITRATE ER 60 MG PO TB24: 60.0000 mg | ORAL_TABLET | Freq: Every morning | ORAL | Status: DC

## 2013-11-05 MED ORDER — OMEPRAZOLE 20 MG PO CPDR: 20.0000 mg | DELAYED_RELEASE_CAPSULE | Freq: Two times a day (BID) | ORAL | Status: DC

## 2013-11-05 MED ORDER — LOVASTATIN 20 MG PO TABS: 20.0000 mg | ORAL_TABLET | Freq: Every day | ORAL | Status: DC

## 2013-11-05 MED ORDER — GLIMEPIRIDE 2 MG PO TABS: 2.0000 mg | ORAL_TABLET | Freq: Every day | ORAL | Status: DC

## 2013-11-05 MED ORDER — PREDNISONE 20 MG PO TABS: 20.0000 mg | ORAL_TABLET | Freq: Every day | ORAL | Status: DC

## 2013-11-05 NOTE — Telephone Encounter (Signed)
Pharmacist with [facility] phoned requesting meds me re-submitted for processing.  Re-submitted per protocol.

## 2013-11-09 ENCOUNTER — Telehealth: Payer: Self-pay | Admitting: *Deleted

## 2013-11-09 DIAGNOSIS — E119 Type 2 diabetes mellitus without complications: Secondary | ICD-10-CM

## 2013-11-09 NOTE — Telephone Encounter (Signed)
On oral medication. Does not really need to check CBGs. But, if they want to check for low blood sugar when he feels bad or to check once or twice a week - OK.  Please provide glucometer from our supply and we will send in Rx for test strips and lancets.

## 2013-11-09 NOTE — Telephone Encounter (Signed)
Daughter phoned requesting diabetic testing supplies for patient-none ordered.  Elevated HgbA1C noted, so please advise regarding checking blood glucose and frequency.  CB# [phone]

## 2013-11-10 MED ORDER — GLUCOSE BLOOD VI STRP: ORAL_STRIP | Status: DC

## 2013-11-10 MED ORDER — ONETOUCH ULTRASOFT LANCETS MISC: Status: DC

## 2013-11-10 NOTE — Telephone Encounter (Signed)
Phoned and notified daughter that sample meter starter kit was available for p/u & that per Dr. Linda Hedges, check blood glucose as needed for low blood sugar sxs and/or once/twice a week.

## 2013-11-11 ENCOUNTER — Other Ambulatory Visit: Payer: Self-pay

## 2013-11-11 MED ORDER — GABAPENTIN 300 MG PO CAPS: 300.0000 mg | ORAL_CAPSULE | Freq: Three times a day (TID) | ORAL | Status: DC

## 2013-11-11 MED ORDER — PREDNISONE 20 MG PO TABS: 20.0000 mg | ORAL_TABLET | Freq: Every day | ORAL | Status: DC

## 2013-11-11 MED ORDER — LOVASTATIN 20 MG PO TABS: 20.0000 mg | ORAL_TABLET | Freq: Every day | ORAL | Status: DC

## 2013-11-11 MED ORDER — GLIMEPIRIDE 2 MG PO TABS: 2.0000 mg | ORAL_TABLET | Freq: Every day | ORAL | Status: DC

## 2013-11-12 ENCOUNTER — Other Ambulatory Visit: Payer: Self-pay

## 2013-11-12 ENCOUNTER — Ambulatory Visit (HOSPITAL_COMMUNITY): Payer: Medicare HMO | Attending: Internal Medicine | Admitting: Radiology

## 2013-11-12 DIAGNOSIS — I5189 Other ill-defined heart diseases: Secondary | ICD-10-CM

## 2013-11-12 DIAGNOSIS — I519 Heart disease, unspecified: Secondary | ICD-10-CM | POA: Diagnosis not present

## 2013-11-12 DIAGNOSIS — R0602 Shortness of breath: Secondary | ICD-10-CM

## 2013-11-12 NOTE — Progress Notes (Signed)
Echocardiogram performed.  

## 2013-11-13 ENCOUNTER — Encounter: Payer: Self-pay | Admitting: Internal Medicine

## 2013-11-16 ENCOUNTER — Telehealth: Payer: Self-pay | Admitting: *Deleted

## 2013-11-16 ENCOUNTER — Other Ambulatory Visit: Payer: Self-pay | Admitting: *Deleted

## 2013-11-16 MED ORDER — METFORMIN HCL 500 MG PO TABS: 500.0000 mg | ORAL_TABLET | Freq: Two times a day (BID) | ORAL | Status: DC

## 2013-11-16 NOTE — Telephone Encounter (Signed)
[name] called states when pt gets up in the morning his BS is normal but when BS is checked in the afternoon it is ranging between 295-300.  Further states when pts BS is elevated he feels very bad.  Please advise

## 2013-11-16 NOTE — Telephone Encounter (Signed)
Add metformin 500 mg bid

## 2013-11-16 NOTE — Telephone Encounter (Signed)
Advised Rx sent

## 2013-11-24 ENCOUNTER — Ambulatory Visit (INDEPENDENT_AMBULATORY_CARE_PROVIDER_SITE_OTHER): Payer: Medicare HMO | Admitting: Internal Medicine

## 2013-11-24 ENCOUNTER — Encounter: Payer: Self-pay | Admitting: Internal Medicine

## 2013-11-24 VITALS — BP 122/80 | HR 89 | Temp 97.1°F | Wt 158.4 lb

## 2013-11-24 DIAGNOSIS — IMO0001 Reserved for inherently not codable concepts without codable children: Secondary | ICD-10-CM

## 2013-11-24 DIAGNOSIS — G4700 Insomnia, unspecified: Secondary | ICD-10-CM

## 2013-11-24 DIAGNOSIS — I519 Heart disease, unspecified: Secondary | ICD-10-CM

## 2013-11-24 DIAGNOSIS — I10 Essential (primary) hypertension: Secondary | ICD-10-CM

## 2013-11-24 DIAGNOSIS — E1165 Type 2 diabetes mellitus with hyperglycemia: Secondary | ICD-10-CM

## 2013-11-24 DIAGNOSIS — I5189 Other ill-defined heart diseases: Secondary | ICD-10-CM

## 2013-11-24 NOTE — Patient Instructions (Signed)
Thank you [name] Elliott for letting me be your doctor. Moving forward you doctor with be Dr. Silvio Pate at [facility] and we will be sure you have an appointment in the first week of June.  Diabetes - please continue to follow a no sugar diet        Continue the metformin twice a day        YOU DO NOT NEED TO CHECK YOUR BLOOD SUGAR UNLESS YOU ARE FEELING BAD.                   Follow up lab work 1st Monday in June, before you see Dr. Silvio Pate.                   Check you feet every day for any skin breaks or sores.   Blood pressure is very well controlled - continue present medications.  Sleepiness - if you can try not to nap during the daytime - save your sleeping for night time.   Heart - the ultrasound of the heart reveals good heart function except the muscle is a little stiff and there is a minor decrease in filling pressure and output.  Plan Continue all your medications  See Dr. Ardyth Harps as directed  Leg swelling - this will be better if you can keep you legs elevated during the day.

## 2013-11-24 NOTE — Progress Notes (Signed)
Pre visit review using our clinic review tool, if applicable. No additional management support is needed unless otherwise documented below in the visit note. 

## 2013-11-25 ENCOUNTER — Telehealth: Payer: Self-pay | Admitting: Internal Medicine

## 2013-11-25 NOTE — Telephone Encounter (Signed)
Relevant patient education assigned to patient using Emmi. ° °

## 2013-11-25 NOTE — Progress Notes (Signed)
Subjective:    Patient ID: [name] Elliott, male    DOB: 1927-10-24, 80 y.o.   MRN: [id number]  [place] presents for follow up of his DM and for a recent 2D echo done as part of an evaluation for SOB/DOe. He reports he has been doing ok His blood sugar log is reviewed - many excursions and only a few low readings. He states he canswtill when his blood sugar drops.  He continues to have DOE. 2 D echo reviewed with him and his daughter: normal EF , grade I diastolic dysfunction. Went over the meaning of this with him  He does have a very poor sleep habit: sleep duration insomnia.  Past Medical History  Diagnosis Date  . Constipation, chronic   . Sebaceous cyst   . Other malaise and fatigue   . Pain in joint, shoulder region     Has chronic shoulder pain  . Persistent disorder of initiating or maintaining sleep   . Thrombocytopenia, unspecified   . Dementia     with periods of amnesia  . Other B-complex deficiencies   . Hypothyroidism   . Pain in limb   . Esophageal reflux   . Unspecified essential hypertension   . Arthropathy, unspecified, site unspecified   . Pure hypercholesterolemia   . Orthostasis   . PVC's (premature ventricular contractions)   . S/P cardiac cath 08/08/11    mild to moderate CAD primarily in the LAD. None are obstructive and appear stable from prior cath in 2007; managed medically  . Arthritis   . CAD (coronary artery disease)     last cath in 2012. Managed medically-some blockages  . Headache(784.0)   . 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 MRI Nov '11 - tendonosis and partial tear. Left shoulder surgery Jan '12 for partial shoulder replacement. [name])   . Blood dyscrasia     thromboctopenia  . Pneumonia 1980's?; 2011  . History of stomach ulcers 11/2012  . Chronic lower back pain    Past Surgical History  Procedure Laterality Date  . Hand surgery Right     crush injury, right fifth digit contracture, limited   motion  . Lumbar spine surgery  2007    Dr Philip Aspen ([place])  . Knee surgery Left 1991    "did it twice in 1 wk" (02/17/2013)  . Cataract extraction Left 2009  . Shoulder open rotator cuff repair Left 8.30.2011  . US echocardiography  02-28-2010    Est EF 50-55%  . Cardiac catheterization  08/2011  . Shoulder hemi-arthroplasty    . Finger amputation Right     pinky finger  . Hardware removal  02/05/2012    Procedure: HARDWARE REMOVAL;  Surgeon: Johnny Bridge, MD;  Location: [facility];  Service: Orthopedics;  Laterality: Left;  . Reverse shoulder arthroplasty  02/05/2012    Procedure: REVERSE SHOULDER ARTHROPLASTY;  Surgeon: Johnny Bridge, MD;  Location: [facility];  Service: Orthopedics;  Laterality: Left;  . Back surgery    . Esophagogastroduodenoscopy N/A 11/19/2012    Procedure: ESOPHAGOGASTRODUODENOSCOPY (EGD);  Surgeon: Jerene Bears, MD;  Location: [facility];  Service: Gastroenterology;  Laterality: N/A;   Family History  Problem Relation Age of Onset  . Breast cancer Mother   . Cancer Brother     throat  . Diabetes Daughter   . Colon cancer Neg Hx   . Kidney disease Father    History   Social History  . Marital Status: Widowed  Spouse Name: N/A    Number of Children: 2  . Years of Education: N/A   Occupational History  . retired    Social History Main Topics  . Smoking status: Former Smoker    Types: Cigars    Quit date: 09/03/1978  . Smokeless tobacco: Current User    Types: Chew     Comment: 02/17/2013 "I aien't smoked a cigar in 20-30 yr"  . Alcohol Use: No  . Drug Use: No  . Sexual Activity: No   Other Topics Concern  . Not on file   Social History Narrative   2nd grade education. Married '58, '95. 2 dtr '59, '64, youngest daughter died septic kidney. Lives alone, handles all ADLs             Current Outpatient Prescriptions on File Prior to Visit  Medication Sig Dispense Refill  . aspirin EC 81 MG tablet Take 81 mg by mouth every morning.         . calcium carbonate (OS-CAL) 600 MG TABS Take 600 mg by mouth every morning.       . cholecalciferol (VITAMIN D) 1000 UNITS tablet Take 1,000 Units by mouth every morning.       . diclofenac sodium (VOLTAREN) 1 % GEL Apply 4 g topically 4 (four) times daily. To each knee  3 Tube  2  . furosemide (LASIX) 40 MG tablet Take 1 tablet (40 mg total) by mouth 2 (two) times daily.  180 tablet  3  . GLUCOSAMINE PO Take 1 tablet by mouth every morning.       Marland Kitchen glucose blood (ONE TOUCH ULTRA TEST) test strip Use as instructed.  Check blood glucose as needed for low blood sugar symptoms.  50 each  12  . isosorbide mononitrate (IMDUR) 60 MG 24 hr tablet Take 1 tablet (60 mg total) by mouth every morning.  90 tablet  1  . Lancets (ONETOUCH ULTRASOFT) lancets Use as instructed.  Check blood glucose as needed for low blood sugar symptoms.  100 each  12  . lovastatin (MEVACOR) 20 MG tablet Take 1 tablet (20 mg total) by mouth at bedtime.  90 tablet  3  . metFORMIN (GLUCOPHAGE) 500 MG tablet Take 1 tablet (500 mg total) by mouth 2 (two) times daily with a meal.  60 tablet  5  . omeprazole (PRILOSEC) 20 MG capsule Take 1 capsule (20 mg total) by mouth 2 (two) times daily before a meal.  180 capsule  3  . oxyCODONE (OXY IR/ROXICODONE) 5 MG immediate release tablet Take 1 tablet (5 mg total) by mouth 2 (two) times daily. To be filled on or after September 05, 2013  60 tablet  0  . potassium chloride SA (K-DUR,KLOR-CON) 20 MEQ tablet Take 1 tablet (20 mEq total) by mouth daily. 1/2 tablet daily or 1 tab qOD  90 tablet  3  . predniSONE (DELTASONE) 20 MG tablet Take 1 tablet (20 mg total) by mouth daily.  90 tablet  3  . promethazine-codeine (PHENERGAN WITH CODEINE) 6.25-10 MG/5ML syrup TAKE ONE (1) TEASPOONFUL BY MOUTH EVERY FOUR HOURS AS NEEDED FOR COUGH  120 mL  0  . traMADol (ULTRAM) 50 MG tablet Take 1 tablet (50 mg total) by mouth every 8 (eight) hours as needed.  30 tablet  0  . glimepiride (AMARYL) 2 MG tablet Take 1  tablet (2 mg total) by mouth daily before breakfast.  90 tablet  3  . [DISCONTINUED] Hyoscyamine Sulfate (HYOMAX-DT) 0.375  MG TBCR Take 1 tab bid for 5 days, then prn abd pain  30 each  1   No current facility-administered medications on file prior to visit.      Review of Systems System review is negative for any constitutional, cardiac, pulmonary, GI or neuro symptoms or complaints other than as described in the HPI.     Objective:   Physical Exam Filed Vitals:   11/24/13 1036  BP: 122/80  Pulse: 89  Temp: 97.1 F (36.2 C)   Wt Readings from Last 3 Encounters:  11/24/13 158 lb 6 oz (71.838 kg)  10/06/13 154 lb (69.854 kg)  09/24/13 159 lb (72.122 kg)   BP Readings from Last 3 Encounters:  11/24/13 122/80  10/06/13 120/80  09/24/13 110/66   Gen'l - small elderly man in no distress HEENT- C&S sclera clear Cor 2+ radial, RRR Pulm - normal respirations Neuro - A&S, normal gait and station.       Assessment & Plan:

## 2013-11-25 NOTE — Assessment & Plan Note (Signed)
Sleepiness - if you can try not to nap during the daytime - save your sleeping for night time.

## 2013-11-25 NOTE — Assessment & Plan Note (Signed)
Stable and doing well. No progressive DOE or decreased exercise tolerance.  Plan  continue diuretic therapy.

## 2013-11-25 NOTE — Assessment & Plan Note (Signed)
Diabetes - please continue to follow a no sugar diet        Continue the metformin twice a day        YOU DO NOT NEED TO CHECK YOUR BLOOD SUGAR UNLESS YOU ARE FEELING BAD.                   Follow up lab work 1st Monday in June, before you see Dr. Silvio Pate.                   Check you feet every day for any skin breaks or sores.

## 2013-11-25 NOTE — Assessment & Plan Note (Signed)
BP Readings from Last 3 Encounters:  11/24/13 122/80  10/06/13 120/80  09/24/13 110/66    Blood pressure is very well controlled - continue present medications.

## 2013-12-01 ENCOUNTER — Telehealth: Payer: Self-pay

## 2013-12-01 NOTE — Telephone Encounter (Signed)
Relevant patient education assigned to patient using Emmi. ° °

## 2013-12-07 LAB — HM DIABETES EYE EXAM

## 2013-12-13 ENCOUNTER — Emergency Department (HOSPITAL_COMMUNITY)
Admission: EM | Admit: 2013-12-13 | Discharge: 2013-12-13 | Disposition: A | Discharge status: Home / Self Care | Payer: Medicare HMO | Attending: Emergency Medicine | Admitting: Emergency Medicine

## 2013-12-13 ENCOUNTER — Encounter (HOSPITAL_COMMUNITY): Payer: Self-pay | Admitting: Emergency Medicine

## 2013-12-13 ENCOUNTER — Emergency Department (HOSPITAL_COMMUNITY): Payer: Medicare HMO

## 2013-12-13 DIAGNOSIS — Z9889 Other specified postprocedural states: Secondary | ICD-10-CM | POA: Insufficient documentation

## 2013-12-13 DIAGNOSIS — G8929 Other chronic pain: Secondary | ICD-10-CM | POA: Insufficient documentation

## 2013-12-13 DIAGNOSIS — M545 Low back pain, unspecified: Secondary | ICD-10-CM | POA: Insufficient documentation

## 2013-12-13 DIAGNOSIS — R609 Edema, unspecified: Secondary | ICD-10-CM | POA: Insufficient documentation

## 2013-12-13 DIAGNOSIS — E7800 Pure hypercholesterolemia, unspecified: Secondary | ICD-10-CM | POA: Insufficient documentation

## 2013-12-13 DIAGNOSIS — Z87891 Personal history of nicotine dependence: Secondary | ICD-10-CM | POA: Insufficient documentation

## 2013-12-13 DIAGNOSIS — I2510 Atherosclerotic heart disease of native coronary artery without angina pectoris: Secondary | ICD-10-CM | POA: Insufficient documentation

## 2013-12-13 DIAGNOSIS — K5900 Constipation, unspecified: Secondary | ICD-10-CM | POA: Insufficient documentation

## 2013-12-13 DIAGNOSIS — M25519 Pain in unspecified shoulder: Secondary | ICD-10-CM | POA: Insufficient documentation

## 2013-12-13 DIAGNOSIS — M129 Arthropathy, unspecified: Secondary | ICD-10-CM | POA: Insufficient documentation

## 2013-12-13 DIAGNOSIS — Z79899 Other long term (current) drug therapy: Secondary | ICD-10-CM | POA: Insufficient documentation

## 2013-12-13 DIAGNOSIS — E039 Hypothyroidism, unspecified: Secondary | ICD-10-CM | POA: Insufficient documentation

## 2013-12-13 DIAGNOSIS — F0390 Unspecified dementia without behavioral disturbance: Secondary | ICD-10-CM | POA: Insufficient documentation

## 2013-12-13 DIAGNOSIS — R1013 Epigastric pain: Secondary | ICD-10-CM | POA: Insufficient documentation

## 2013-12-13 DIAGNOSIS — Z7982 Long term (current) use of aspirin: Secondary | ICD-10-CM | POA: Insufficient documentation

## 2013-12-13 DIAGNOSIS — Z791 Long term (current) use of non-steroidal anti-inflammatories (NSAID): Secondary | ICD-10-CM | POA: Insufficient documentation

## 2013-12-13 DIAGNOSIS — K219 Gastro-esophageal reflux disease without esophagitis: Secondary | ICD-10-CM | POA: Insufficient documentation

## 2013-12-13 LAB — URINALYSIS, ROUTINE W REFLEX MICROSCOPIC
Bilirubin Urine: NEGATIVE
Glucose, UA: 250 mg/dL — AB
Hgb urine dipstick: NEGATIVE
Ketones, ur: NEGATIVE mg/dL
LEUKOCYTES UA: NEGATIVE
Nitrite: NEGATIVE
PROTEIN: NEGATIVE mg/dL
Specific Gravity, Urine: 1.01 (ref 1.005–1.030)
UROBILINOGEN UA: 0.2 mg/dL (ref 0.0–1.0)
pH: 5.5 (ref 5.0–8.0)

## 2013-12-13 LAB — PROTIME-INR
INR: 0.99 (ref 0.00–1.49)
PROTHROMBIN TIME: 12.9 s (ref 11.6–15.2)

## 2013-12-13 LAB — COMPREHENSIVE METABOLIC PANEL
ALBUMIN: 3.6 g/dL (ref 3.5–5.2)
ALT: 19 U/L (ref 0–53)
AST: 20 U/L (ref 0–37)
Alkaline Phosphatase: 63 U/L (ref 39–117)
BUN: 22 mg/dL (ref 6–23)
CALCIUM: 9.8 mg/dL (ref 8.4–10.5)
CO2: 26 mEq/L (ref 19–32)
CREATININE: 1.26 mg/dL (ref 0.50–1.35)
Chloride: 94 mEq/L — ABNORMAL LOW (ref 96–112)
GFR calc non Af Amer: 51 mL/min — ABNORMAL LOW (ref >90)
GFR, EST AFRICAN AMERICAN: 59 mL/min — AB (ref >90)
Glucose, Bld: 248 mg/dL — ABNORMAL HIGH (ref 70–99)
POTASSIUM: 4.1 meq/L (ref 3.7–5.3)
Sodium: 137 mEq/L (ref 137–147)
TOTAL PROTEIN: 6.6 g/dL (ref 6.0–8.3)
Total Bilirubin: 0.6 mg/dL (ref 0.3–1.2)

## 2013-12-13 LAB — CBC WITH DIFFERENTIAL/PLATELET
BASOS ABS: 0 10*3/uL (ref 0.0–0.1)
Basophils Relative: 0 % (ref 0–1)
EOS ABS: 0.1 10*3/uL (ref 0.0–0.7)
Eosinophils Relative: 1 % (ref 0–5)
HEMATOCRIT: 41.2 % (ref 39.0–52.0)
Hemoglobin: 13.9 g/dL (ref 13.0–17.0)
LYMPHS ABS: 1.5 10*3/uL (ref 0.7–4.0)
Lymphocytes Relative: 17 % (ref 12–46)
MCH: 31 pg (ref 26.0–34.0)
MCHC: 33.7 g/dL (ref 30.0–36.0)
MCV: 92 fL (ref 78.0–100.0)
MONO ABS: 0.7 10*3/uL (ref 0.1–1.0)
Monocytes Relative: 8 % (ref 3–12)
NEUTROS ABS: 6.7 10*3/uL (ref 1.7–7.7)
Neutrophils Relative %: 74 % (ref 43–77)
Platelets: 160 10*3/uL (ref 150–400)
RBC: 4.48 MIL/uL (ref 4.22–5.81)
RDW: 14 % (ref 11.5–15.5)
WBC: 9 10*3/uL (ref 4.0–10.5)

## 2013-12-13 LAB — I-STAT TROPONIN, ED: Troponin i, poc: 0.02 ng/mL (ref 0.00–0.08)

## 2013-12-13 LAB — CBG MONITORING, ED: GLUCOSE-CAPILLARY: 206 mg/dL — AB (ref 70–99)

## 2013-12-13 LAB — LIPASE, BLOOD: Lipase: 22 U/L (ref 11–59)

## 2013-12-13 MED ORDER — METOCLOPRAMIDE HCL 10 MG PO TABS: 10.0000 mg | ORAL_TABLET | Freq: Three times a day (TID) | ORAL | Status: DC | PRN

## 2013-12-13 NOTE — Discharge Instructions (Signed)
Gastroparesis  Gastroparesis is also called slowed stomach emptying (delayed gastric emptying). It is a condition in which the stomach takes too long to empty its contents. It often happens in people with diabetes.  CAUSES  Gastroparesis happens when nerves to the stomach are damaged or stop working. When the nerves are damaged, the muscles of the stomach and intestines do not work normally. The movement of food is slowed or stopped. High blood glucose (sugar) causes changes in nerves and can damage the blood vessels that carry oxygen and nutrients to the nerves. RISK FACTORS  Diabetes.  Post-viral syndromes.  Eating disorders (anorexia, bulimia).  Surgery on the stomach or vagus nerve.  Gastroesophageal reflux disease (rarely).  Smooth muscle disorders (amyloidosis, scleroderma).  Metabolic disorders, including hypothyroidism.  Parkinson disease. SYMPTOMS   Heartburn.  Feeling sick to your stomach (nausea).  Vomiting of undigested food.  An early feeling of fullness when eating.  Weight loss.  Abdominal bloating.  Erratic blood glucose levels.  Lack of appetite.  Gastroesophageal reflux.  Spasms of the stomach wall. Complications can include:  Bacterial overgrowth in stomach. Food stays in the stomach and can ferment and cause bacteria to grow.  Weight loss due to difficulty digesting and absorbing nutrients.  Vomiting.  Obstruction in the stomach. Undigested food can harden and cause nausea and vomiting.  Blood glucose fluctuations caused by inconsistent food absorption. DIAGNOSIS  The diagnosis of gastroparesis is confirmed through one or more of the following tests:  Barium X-rays and scans. These tests look at how long it takes for food to move through the stomach.  Gastric manometry. This test measures electrical and muscular activity in the stomach. A thin tube is passed down the throat into the stomach. The tube contains a wire that takes measurements  of the stomach's electrical and muscular activity as it digests liquids and solid food.  Endoscopy. This procedure is done with a long, thin tube called an endoscope. It is passed through the mouth and gently down the esophagus into the stomach. This tube helps the caregiver look at the lining of the stomach to check for any abnormalities.  Ultrasonography. This can rule out gallbladder disease or pancreatitis. This test will outline and define the shape of the gallbladder and pancreas. TREATMENT   Treatments may include:  Exercise.  Medicines to control nausea and vomiting.  Medicines to stimulate stomach muscles.  Changes in what and when you eat.  Having smaller meals more often.  Eating low-fiber forms of high-fiber foods, such as eating cooked vegetables instead of raw vegetables.  Eating low-fat foods.  Consuming liquids, which are easier to digest.  In severe cases, feeding tubes and intravenous (IV) feeding may be needed. It is important to note that in most cases, treatment does not cure gastroparesis. It is usually a lasting (chronic) condition. Treatment helps you manage the underlying condition so that you can be as healthy and comfortable as possible. Other treatments  A gastric neurostimulator has been developed to assist people with gastroparesis. The battery-operated device is surgically implanted. It emits mild electrical pulses to help improve stomach emptying and to control nausea and vomiting.  The use of botulinum toxin has been shown to improve stomach emptying by decreasing the prolonged contractions of the muscle between the stomach and the small intestine (pyloric sphincter). The benefits are temporary. SEEK MEDICAL CARE IF:   You have diabetes and you are having problems keeping your blood glucose in goal range.  You are having nausea,  vomiting, bloating, or early feelings of fullness with eating.  Your symptoms do not change with a change in  diet. Document Released: 08/20/2005 Document Revised: 12/15/2012 Document Reviewed: 01/27/2009 [facility] Patient Information 2014 [place], [place]. Gastritis, Adult Gastritis is soreness and swelling (inflammation) of the lining of the stomach. Gastritis can develop as a sudden onset (acute) or long-term (chronic) condition. If gastritis is not treated, it can lead to stomach bleeding and ulcers. CAUSES  Gastritis occurs when the stomach lining is weak or damaged. Digestive juices from the stomach then inflame the weakened stomach lining. The stomach lining may be weak or damaged due to viral or bacterial infections. One common bacterial infection is the Helicobacter pylori infection. Gastritis can also result from excessive alcohol consumption, taking certain medicines, or having too much acid in the stomach.  SYMPTOMS  In some cases, there are no symptoms. When symptoms are present, they may include:  Pain or a burning sensation in the upper abdomen.  Nausea.  Vomiting.  An uncomfortable feeling of fullness after eating. DIAGNOSIS  Your caregiver may suspect you have gastritis based on your symptoms and a physical exam. To determine the cause of your gastritis, your caregiver may perform the following:  Blood or stool tests to check for the H pylori bacterium.  Gastroscopy. A thin, flexible tube (endoscope) is passed down the esophagus and into the stomach. The endoscope has a light and camera on the end. Your caregiver uses the endoscope to view the inside of the stomach.  Taking a tissue sample (biopsy) from the stomach to examine under a microscope. TREATMENT  Depending on the cause of your gastritis, medicines may be prescribed. If you have a bacterial infection, such as an H pylori infection, antibiotics may be given. If your gastritis is caused by too much acid in the stomach, H2 blockers or antacids may be given. Your caregiver may recommend that you stop taking aspirin, ibuprofen,  or other nonsteroidal anti-inflammatory drugs (NSAIDs). HOME CARE INSTRUCTIONS  Only take over-the-counter or prescription medicines as directed by your caregiver.  If you were given antibiotic medicines, take them as directed. Finish them even if you start to feel better.  Drink enough fluids to keep your urine clear or pale yellow.  Avoid foods and drinks that make your symptoms worse, such as:  Caffeine or alcoholic drinks.  Chocolate.  Peppermint or mint flavorings.  Garlic and onions.  Spicy foods.  Citrus fruits, such as oranges, lemons, or limes.  Tomato-based foods such as sauce, chili, salsa, and pizza.  Fried and fatty foods.  Eat small, frequent meals instead of large meals. SEEK IMMEDIATE MEDICAL CARE IF:   You have black or dark red stools.  You vomit blood or material that looks like coffee grounds.  You are unable to keep fluids down.  Your abdominal pain gets worse.  You have a fever.  You do not feel better after 1 week.  You have any other questions or concerns. MAKE SURE YOU:  Understand these instructions.  Will watch your condition.  Will get help right away if you are not doing well or get worse. Document Released: 08/14/2001 Document Revised: 02/19/2012 Document Reviewed: 10/03/2011 [facility] Patient Information 2014 [place].

## 2013-12-13 NOTE — ED Notes (Addendum)
Pt from home via EMS- per ems pt started to have epigastric pain that started 3 hrs PTA, and bilateral lower extremity edema. Reports that he has abdominal pain and edema pain everyday. Pt recently dx with DM II. Pt is A&O and in NAD. PT rates pain 8/10, EMS gave 1 nitro with no relief.

## 2013-12-13 NOTE — ED Notes (Signed)
Pt given urinal and made aware of need for urine specimen 

## 2013-12-13 NOTE — ED Notes (Signed)
Pt from home c/o "hurting all over." Pt states that he had CP today approx 3 hrs PTA. Pt denies SOB, N/V. Pt is A&O and in NAD

## 2013-12-13 NOTE — ED Provider Notes (Signed)
TIME SEEN: 1:38 PM  CHIEF COMPLAINT: Epigastric abdominal pain  HPI:  Patient is an 80 year old male with a history of dementia, hypothyroidism, CAD with his last cardiac catheterization in 2012 who is medically managed, hyperlipidemia, hypertension, history of gastric ulcers who presents to the emergency department with several months of epigastric pain. Per patient and daughter, patient has epigastric pain every day especially after eating. He describes as a "contents hurt". He states it happened today early this morning and again 3 hours prior to arrival. He denies any shortness of breath, diaphoresis, nausea or vomiting, bloody stools or melena. Per daughter, patient has had this same pain for "years". He has been seen by his PCP and she states has had a full workup. He was recently diagnosed with diabetes and started on metformin for hemoglobin A1c of greater than 11. In the emergency department, patient is pain-free.  ROS: See HPI Constitutional: no fever  Eyes: no drainage  ENT: no runny nose   Cardiovascular:  no chest pain  Resp: no SOB  GI: no vomiting GU: no dysuria Integumentary: no rash  Allergy: no hives  Musculoskeletal: no leg swelling  Neurological: no slurred speech ROS otherwise negative  PAST MEDICAL HISTORY/PAST SURGICAL HISTORY:  Past Medical History  Diagnosis Date  . Constipation, chronic   . Sebaceous cyst   . Other malaise and fatigue   . Pain in joint, shoulder region     Has chronic shoulder pain  . Persistent disorder of initiating or maintaining sleep   . Thrombocytopenia, unspecified   . Dementia     with periods of amnesia  . Other B-complex deficiencies   . Hypothyroidism   . Pain in limb   . Esophageal reflux   . Unspecified essential hypertension   . Arthropathy, unspecified, site unspecified   . Pure hypercholesterolemia   . Orthostasis   . PVC's (premature ventricular contractions)   . S/P cardiac cath 08/08/11    mild to moderate CAD  primarily in the LAD. None are obstructive and appear stable from prior cath in 2007; managed medically  . Arthritis   . CAD (coronary artery disease)     last cath in 2012. Managed medically-some blockages  . Headache(784.0)   . 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 MRI Nov '11 - tendonosis and partial tear. Left shoulder surgery Jan '12 for partial shoulder replacement. [name])   . Blood dyscrasia     thromboctopenia  . Pneumonia 1980's?; 2011  . History of stomach ulcers 11/2012  . Chronic lower back pain     MEDICATIONS:  Prior to Admission medications   Medication Sig Start Date End Date Taking? Authorizing Provider  aspirin EC 81 MG tablet Take 81 mg by mouth every morning.     Historical Provider, MD  calcium carbonate (OS-CAL) 600 MG TABS Take 600 mg by mouth every morning.     Historical Provider, MD  cholecalciferol (VITAMIN D) 1000 UNITS tablet Take 1,000 Units by mouth every morning.     Historical Provider, MD  diclofenac sodium (VOLTAREN) 1 % GEL Apply 4 g topically 4 (four) times daily. To each knee 03/24/13   Neena Rhymes, MD  furosemide (LASIX) 40 MG tablet Take 1 tablet (40 mg total) by mouth 2 (two) times daily. 11/05/13   Neena Rhymes, MD  glimepiride (AMARYL) 2 MG tablet Take 1 tablet (2 mg total) by mouth daily before breakfast. 11/11/13   Neena Rhymes, MD  GLUCOSAMINE PO  Take 1 tablet by mouth every morning.     Historical Provider, MD  glucose blood (ONE TOUCH ULTRA TEST) test strip Use as instructed.  Check blood glucose as needed for low blood sugar symptoms. 11/10/13   Neena Rhymes, MD  isosorbide mononitrate (IMDUR) 60 MG 24 hr tablet Take 1 tablet (60 mg total) by mouth every morning. 11/05/13   Neena Rhymes, MD  Lancets [facility] ULTRASOFT) lancets Use as instructed.  Check blood glucose as needed for low blood sugar symptoms. 11/10/13   Neena Rhymes, MD  lovastatin (MEVACOR) 20 MG tablet Take 1 tablet (20 mg  total) by mouth at bedtime. 11/11/13   Neena Rhymes, MD  metFORMIN (GLUCOPHAGE) 500 MG tablet Take 1 tablet (500 mg total) by mouth 2 (two) times daily with a meal. 11/16/13   Neena Rhymes, MD  omeprazole (PRILOSEC) 20 MG capsule Take 1 capsule (20 mg total) by mouth 2 (two) times daily before a meal. 11/05/13   Neena Rhymes, MD  oxyCODONE (OXY IR/ROXICODONE) 5 MG immediate release tablet Take 1 tablet (5 mg total) by mouth 2 (two) times daily. To be filled on or after September 05, 2013 07/07/13   Neena Rhymes, MD  potassium chloride SA (K-DUR,KLOR-CON) 20 MEQ tablet Take 1 tablet (20 mEq total) by mouth daily. 1/2 tablet daily or 1 tab qOD 11/05/13   Neena Rhymes, MD  predniSONE (DELTASONE) 20 MG tablet Take 1 tablet (20 mg total) by mouth daily. 11/11/13   Neena Rhymes, MD  promethazine-codeine (PHENERGAN WITH CODEINE) 6.25-10 MG/5ML syrup TAKE ONE (1) TEASPOONFUL BY MOUTH EVERY FOUR HOURS AS NEEDED FOR COUGH 10/16/13   Janith Lima, MD  traMADol (ULTRAM) 50 MG tablet Take 1 tablet (50 mg total) by mouth every 8 (eight) hours as needed. 08/07/13   Neena Rhymes, MD    ALLERGIES:  Allergies  Allergen Reactions  . Crestor [Rosuvastatin Calcium] Other (See Comments)    Muscle pain/aches    SOCIAL HISTORY:  History  Substance Use Topics  . Smoking status: Former Smoker    Types: Cigars    Quit date: 09/03/1978  . Smokeless tobacco: Current User    Types: Chew     Comment: 02/17/2013 "I aien't smoked a cigar in 20-30 yr"  . Alcohol Use: No    FAMILY HISTORY: Family History  Problem Relation Age of Onset  . Breast cancer Mother   . Cancer Brother     throat  . Diabetes Daughter   . Colon cancer Neg Hx   . Kidney disease Father     EXAM: BP 119/57  Pulse 88  Temp(Src) 97.7 F (36.5 C) (Oral)  Resp 20  SpO2 100% CONSTITUTIONAL: Alert and oriented and responds appropriately to questions. Well-appearing; well-nourished HEAD: Normocephalic EYES: Conjunctivae  clear, PERRL ENT: normal nose; no rhinorrhea; moist mucous membranes; pharynx without lesions noted NECK: Supple, no meningismus, no LAD  CARD: RRR; S1 and S2 appreciated; no murmurs, no clicks, no rubs, no gallops RESP: Normal chest excursion without splinting or tachypnea; breath sounds clear and equal bilaterally; no wheezes, no rhonchi, no rales,  ABD/GI: Normal bowel sounds; non-distended; soft, non-tender, no rebound, no guarding BACK:  The back appears normal and is non-tender to palpation, there is no CVA tenderness EXT: Normal ROM in all joints; non-tender to palpation; no edema; normal capillary refill; no cyanosis    SKIN: Normal color for age and race; warm NEURO: Moves all extremities equally  PSYCH: The patient's mood and manner are appropriate. Grooming and personal hygiene are appropriate.  MEDICAL DECISION MAKING: Patient here with epigastric pain. Concern for gastritis versus gastroparesis versus ulcer. Less likely ACS. He is pain-free so I doubt dissection. We'll obtain cardiac labs, abdominal labs, acute abdominal series, EKG. Will monitor patient closely  ED PROGRESS: Patient has been hemodynamically stable while here in the emergency department. His abdominal exam is benign and his pain is currently gone. His initial troponin is negative greater than 6 hours after onset of his recent episodes. His EKG shows no new ischemic changes. It is unchanged compared to September 2010. His abdominal series shows no free air, obstruction but the patient is constipated. His lipase, LFTs are also normal. Have discussed with patient's daughter that I feel this is likely gastroparesis versus gastritis versus ulcer and recommended close outpatient PCP followup and possible endoscopy. Have recommended that he check his sugar regularly. Patient's daughter is upset with plan of care. She will not verbalize to me why she is upset. I believe she is frustrated that he has been complaining of the same  pain for several years without any obvious source. Have discussed with her that I do not feel this is life-threatening event that he is safe to be discharged home as I do recommend close outpatient PCP followup. Have given supportive care instructions and return precautions.    EKG Interpretation  Date/Time:  Sunday December 13 2013 13:14:29 EDT Ventricular Rate:  81 PR Interval:  147 QRS Duration: 101 QT Interval:  417 QTC Calculation: 484 R Axis:   -51 Text Interpretation:  Sinus rhythm Multiple premature complexes, vent  Incomplete RBBB and LAFB Low voltage, precordial leads Borderline T wave abnormalities Borderline prolonged QT interval No significant change since Sept 8 2010 Confirmed by [place],  DO, Baelyn Doring [id number]) on 12/13/2013 1:42:16 PM        [place], DO 12/13/13 [id number]

## 2013-12-13 NOTE — ED Notes (Signed)
Bed: VB16 Expected date: 12/13/13 Expected time: 12:55 PM Means of arrival: Ambulance Comments: eDEMA IN EXT, WEAKNESS

## 2013-12-21 ENCOUNTER — Ambulatory Visit (INDEPENDENT_AMBULATORY_CARE_PROVIDER_SITE_OTHER): Payer: Commercial Managed Care - HMO | Admitting: Internal Medicine

## 2013-12-21 ENCOUNTER — Encounter: Payer: Self-pay | Admitting: Internal Medicine

## 2013-12-21 VITALS — BP 120/60 | HR 77 | Temp 97.7°F | Wt 159.0 lb

## 2013-12-21 DIAGNOSIS — N183 Chronic kidney disease, stage 3 unspecified: Secondary | ICD-10-CM | POA: Diagnosis not present

## 2013-12-21 DIAGNOSIS — E538 Deficiency of other specified B group vitamins: Secondary | ICD-10-CM

## 2013-12-21 DIAGNOSIS — M25561 Pain in right knee: Secondary | ICD-10-CM

## 2013-12-21 DIAGNOSIS — I519 Heart disease, unspecified: Secondary | ICD-10-CM

## 2013-12-21 DIAGNOSIS — I5189 Other ill-defined heart diseases: Secondary | ICD-10-CM

## 2013-12-21 DIAGNOSIS — I10 Essential (primary) hypertension: Secondary | ICD-10-CM

## 2013-12-21 DIAGNOSIS — F0150 Vascular dementia without behavioral disturbance: Secondary | ICD-10-CM | POA: Diagnosis not present

## 2013-12-21 DIAGNOSIS — E7800 Pure hypercholesterolemia, unspecified: Secondary | ICD-10-CM

## 2013-12-21 DIAGNOSIS — M25562 Pain in left knee: Secondary | ICD-10-CM

## 2013-12-21 DIAGNOSIS — E1165 Type 2 diabetes mellitus with hyperglycemia: Secondary | ICD-10-CM

## 2013-12-21 DIAGNOSIS — E1129 Type 2 diabetes mellitus with other diabetic kidney complication: Secondary | ICD-10-CM | POA: Diagnosis not present

## 2013-12-21 DIAGNOSIS — I2510 Atherosclerotic heart disease of native coronary artery without angina pectoris: Secondary | ICD-10-CM

## 2013-12-21 DIAGNOSIS — M25569 Pain in unspecified knee: Secondary | ICD-10-CM

## 2013-12-21 LAB — LIPID PANEL
CHOLESTEROL: 169 mg/dL (ref 0–200)
HDL: 62.7 mg/dL (ref >39.00)
LDL CALC: 91 mg/dL (ref 0–99)
TRIGLYCERIDES: 79 mg/dL (ref 0.0–149.0)
Total CHOL/HDL Ratio: 3
VLDL: 15.8 mg/dL (ref 0.0–40.0)

## 2013-12-21 LAB — HEMOGLOBIN A1C: Hgb A1c MFr Bld: 8.8 % — ABNORMAL HIGH (ref 4.6–6.5)

## 2013-12-21 LAB — MICROALBUMIN / CREATININE URINE RATIO
CREATININE, U: 25.4 mg/dL
Microalb Creat Ratio: 1.2 mg/g (ref 0.0–30.0)
Microalb, Ur: 0.3 mg/dL (ref 0.0–1.9)

## 2013-12-21 LAB — TSH: TSH: 2.82 u[IU]/mL (ref 0.35–5.50)

## 2013-12-21 LAB — VITAMIN B12: Vitamin B-12: 944 pg/mL — ABNORMAL HIGH (ref 211–911)

## 2013-12-21 LAB — HM DIABETES FOOT EXAM

## 2013-12-21 LAB — T4, FREE: Free T4: 0.94 ng/dL (ref 0.60–1.60)

## 2013-12-21 NOTE — Assessment & Plan Note (Signed)
Will start ACEI if has microalbuminuria

## 2013-12-21 NOTE — Patient Instructions (Addendum)
Please cut down the prednisone to 10mg  every other day for a month. If there is no major change, stop it then. After that, he can try off the glucosamine---- only restart if he notes a change in his arthritis pain.  Diabetes Meal Planning Guide The diabetes meal planning guide is a tool to help you plan your meals and snacks. It is important for people with diabetes to manage their blood glucose (sugar) levels. Choosing the right foods and the right amounts throughout your day will help control your blood glucose. Eating right can even help you improve your blood pressure and reach or maintain a healthy weight. CARBOHYDRATE COUNTING MADE EASY When you eat carbohydrates, they turn to sugar. This raises your blood glucose level. Counting carbohydrates can help you control this level so you feel better. When you plan your meals by counting carbohydrates, you can have more flexibility in what you eat and balance your medicine with your food intake. Carbohydrate counting simply means adding up the total amount of carbohydrate grams in your meals and snacks. Try to eat about the same amount at each meal. Foods with carbohydrates are listed below. Each portion below is 1 carbohydrate serving or 15 grams of carbohydrates. Ask your dietician how many grams of carbohydrates you should eat at each meal or snack. Grains and Starches  1 slice bread.   English muffin or hotdog/hamburger bun.   cup cold cereal (unsweetened).   cup cooked pasta or rice.   cup starchy vegetables (corn, potatoes, peas, beans, winter squash).  1 tortilla (6 inches).   bagel.  1 waffle or pancake (size of a CD).   cup cooked cereal.  4 to 6 small crackers. *Whole grain is recommended. Fruit  1 cup fresh unsweetened berries, melon, papaya, pineapple.  1 small fresh fruit.   banana or mango.   cup fruit juice (4 oz unsweetened).   cup canned fruit in natural juice or water.  2 tbs dried fruit.  12 to 15  grapes or cherries. Milk and Yogurt  1 cup fat-free or 1% milk.  1 cup soy milk.  6 oz light yogurt with sugar-free sweetener.  6 oz low-fat soy yogurt.  6 oz plain yogurt. Vegetables  1 cup raw or  cup cooked is counted as 0 carbohydrates or a "free" food.  If you eat 3 or more servings at 1 meal, count them as 1 carbohydrate serving. Other Carbohydrates   oz chips or pretzels.   cup ice cream or frozen yogurt.   cup sherbet or sorbet.  2 inch square cake, no frosting.  1 tbs honey, sugar, jam, jelly, or syrup.  2 small cookies.  3 squares of graham crackers.  3 cups popcorn.  6 crackers.  1 cup broth-based soup.  Count 1 cup casserole or other mixed foods as 2 carbohydrate servings.  Foods with less than 20 calories in a serving may be counted as 0 carbohydrates or a "free" food. You may want to purchase a book or computer software that lists the carbohydrate gram counts of different foods. In addition, the nutrition facts panel on the labels of the foods you eat are a good source of this information. The label will tell you how big the serving size is and the total number of carbohydrate grams you will be eating per serving. Divide this number by 15 to obtain the number of carbohydrate servings in a portion. Remember, 1 carbohydrate serving equals 15 grams of carbohydrate. SERVING SIZES Measuring  foods and serving sizes helps you make sure you are getting the right amount of food. The list below tells how big or small some common serving sizes are.  1 oz.........4 stacked dice.  3 oz........Marland KitchenDeck of cards.  1 tsp.......Marland KitchenTip of little finger.  1 tbs......Marland KitchenMarland KitchenThumb.  2 tbs.......Marland KitchenGolf ball.   cup......Marland KitchenHalf of a fist.  1 cup.......Marland KitchenA fist. SAMPLE DIABETES MEAL PLAN Below is a sample meal plan that includes foods from the grain and starches, dairy, vegetable, fruit, and meat groups. A dietician can individualize a meal plan to fit your calorie needs and  tell you the number of servings needed from each food group. However, controlling the total amount of carbohydrates in your meal or snack is more important than making sure you include all of the food groups at every meal. You may interchange carbohydrate containing foods (dairy, starches, and fruits). The meal plan below is an example of a 2000 calorie diet using carbohydrate counting. This meal plan has 17 carbohydrate servings. Breakfast  1 cup oatmeal (2 carb servings).   cup light yogurt (1 carb serving).  1 cup blueberries (1 carb serving).   cup almonds. Snack  1 large apple (2 carb servings).  1 low-fat string cheese stick. Lunch  Chicken breast salad.  1 cup spinach.   cup chopped tomatoes.  2 oz chicken breast, sliced.  2 tbs low-fat [place] dressing.  12 whole-wheat crackers (2 carb servings).  12 to 15 grapes (1 carb serving).  1 cup low-fat milk (1 carb serving). Snack  1 cup carrots.   cup hummus (1 carb serving). Dinner  3 oz broiled salmon.  1 cup brown rice (3 carb servings). Snack  1  cups steamed broccoli (1 carb serving) drizzled with 1 tsp olive oil and lemon juice.  1 cup light pudding (2 carb servings). DIABETES MEAL PLANNING WORKSHEET Your dietician can use this worksheet to help you decide how many servings of foods and what types of foods are right for you.  BREAKFAST Food Group and Servings / Carb Servings Grain/Starches __________________________________ Dairy __________________________________________ Vegetable ______________________________________ Fruit ___________________________________________ Meat __________________________________________ Fat ____________________________________________ LUNCH Food Group and Servings / Carb Servings Grain/Starches ___________________________________ Dairy ___________________________________________ Fruit ____________________________________________ Meat  ___________________________________________ Fat _____________________________________________ Wonda Cheng Food Group and Servings / Carb Servings Grain/Starches ___________________________________ Dairy ___________________________________________ Fruit ____________________________________________ Meat ___________________________________________ Fat _____________________________________________ SNACKS Food Group and Servings / Carb Servings Grain/Starches ___________________________________ Dairy ___________________________________________ Vegetable _______________________________________ Fruit ____________________________________________ Meat ___________________________________________ Fat _____________________________________________ DAILY TOTALS Starches _________________________ Vegetable ________________________ Fruit ____________________________ Dairy ____________________________ Meat ____________________________ Fat ______________________________ Document Released: 05/17/2005 Document Revised: 11/12/2011 Document Reviewed: 03/28/2009 [employer] Patient Information 2014 [employer]

## 2013-12-21 NOTE — Assessment & Plan Note (Signed)
No recent chest pain Hasn't been taking SL nitro May be able to get him off the isosorbide

## 2013-12-21 NOTE — Progress Notes (Signed)
Subjective:    Patient ID: [name] Elliott, male    DOB: July 20, 1928, 80 y.o.   MRN: [id number]  HPI Here with daughter Establishing with me---Dr Norins retired Lives alone Still drives Shops for food, cooks, etc  Awakens feeling good but then feels bad within an hour or so "I just feel bad all over"-- then starts to feel better again  Initiates sleep around 9PM Awakens at [place] not sleeping that great  Checks sugars most morning 187 today-- as low as 36. But usually around 150 Rare low sugar reactions -- only checks in AM but is vague about symptoms Eye exam with Dr Gershon Crane about 2 weeks ago---look good  Reviewed ER visit Daughter notes that he had numbness in hands, face was swollen Daughter called EMS--got nitro in ambulance Unclear about GI problems--?gastroparesis Tried metoclopramide---no real help  On prednisone for "joint problems" Daughter cut it down to 10mg  Mostly pain in right knee  Mild memory problems Variable CT seems to indicate ischemic areas--discussed that this is probably vascular and hopefully won't progress  Chronic edema Needs to stay on furosemide  Current Outpatient Prescriptions on File Prior to Visit  Medication Sig Dispense Refill  . aspirin EC 81 MG tablet Take 81 mg by mouth every morning.       . calcium carbonate (OS-CAL) 600 MG TABS Take 600 mg by mouth every morning.       . cholecalciferol (VITAMIN D) 1000 UNITS tablet Take 1,000 Units by mouth every morning.       . diclofenac sodium (VOLTAREN) 1 % GEL Apply 4 g topically 4 (four) times daily. To each knee  3 Tube  2  . furosemide (LASIX) 40 MG tablet Take 1 tablet (40 mg total) by mouth 2 (two) times daily.  180 tablet  3  . GLUCOSAMINE PO Take 1 tablet by mouth every morning.       . isosorbide mononitrate (IMDUR) 60 MG 24 hr tablet Take 1 tablet (60 mg total) by mouth every morning.  90 tablet  1  . lovastatin (MEVACOR) 20 MG tablet Take 1 tablet (20 mg total) by mouth at  bedtime.  90 tablet  3  . metFORMIN (GLUCOPHAGE) 500 MG tablet Take 1 tablet (500 mg total) by mouth 2 (two) times daily with a meal.  60 tablet  5  . omeprazole (PRILOSEC) 20 MG capsule Take 1 capsule (20 mg total) by mouth 2 (two) times daily before a meal.  180 capsule  3  . potassium chloride SA (K-DUR,KLOR-CON) 20 MEQ tablet Take 20 mEq by mouth 2 (two) times daily.      . predniSONE (DELTASONE) 20 MG tablet Take 10 mg by mouth every other day.       . [DISCONTINUED] Hyoscyamine Sulfate (HYOMAX-DT) 0.375 MG TBCR Take 1 tab bid for 5 days, then prn abd pain  30 each  1   No current facility-administered medications on file prior to visit.    Allergies  Allergen Reactions  . Crestor [Rosuvastatin Calcium] Other (See Comments)    Muscle pain/aches    Past Medical History  Diagnosis Date  . Constipation, chronic   . Sebaceous cyst   . Other malaise and fatigue   . Pain in joint, shoulder region     Has chronic shoulder pain  . Persistent disorder of initiating or maintaining sleep   . Thrombocytopenia, unspecified   . Vascular dementia without behavioral disturbance     with periods of amnesia  .  Other B-complex deficiencies   . Hypothyroidism   . Pain in limb   . Esophageal reflux   . Unspecified essential hypertension   . Arthropathy, unspecified, site unspecified   . Pure hypercholesterolemia   . Orthostasis   . PVC's (premature ventricular contractions)   . S/P cardiac cath 08/08/11    mild to moderate CAD primarily in the LAD. None are obstructive and appear stable from prior cath in 2007; managed medically  . Arthritis   . CAD (coronary artery disease)     last cath in 2012. Managed medically-some blockages  . Headache(784.0)   . 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 MRI Nov '11 - tendonosis and partial tear. Left shoulder surgery Jan '12 for partial shoulder replacement. [name])   . Blood dyscrasia     thromboctopenia  . Pneumonia  1980's?; 2011  . History of stomach ulcers 11/2012  . Chronic lower back pain     Past Surgical History  Procedure Laterality Date  . Hand surgery Right     crush injury, right fifth digit contracture, limited  motion  . Lumbar spine surgery  2007    Dr Philip Aspen ([place])  . Knee surgery Left 1991    "did it twice in 1 wk" (02/17/2013)  . Cataract extraction Left 2009  . Shoulder open rotator cuff repair Left 8.30.2011  . US echocardiography  02-28-2010    Est EF 50-55%  . Cardiac catheterization  08/2011  . Shoulder hemi-arthroplasty    . Finger amputation Right     pinky finger  . Hardware removal  02/05/2012    Procedure: HARDWARE REMOVAL;  Surgeon: Johnny Bridge, MD;  Location: [place];  Service: Orthopedics;  Laterality: Left;  . Reverse shoulder arthroplasty  02/05/2012    Procedure: REVERSE SHOULDER ARTHROPLASTY;  Surgeon: Johnny Bridge, MD;  Location: [place];  Service: Orthopedics;  Laterality: Left;  . Back surgery    . Esophagogastroduodenoscopy N/A 11/19/2012    Procedure: ESOPHAGOGASTRODUODENOSCOPY (EGD);  Surgeon: Jerene Bears, MD;  Location: [place];  Service: Gastroenterology;  Laterality: N/A;    Family History  Problem Relation Age of Onset  . Breast cancer Mother   . Cancer Brother     throat  . Diabetes Daughter   . Colon cancer Neg Hx   . Kidney disease Father     History   Social History  . Marital Status: Widowed    Spouse Name: N/A    Number of Children: 2  . Years of Education: N/A   Occupational History  . retired    Social History Main Topics  . Smoking status: Former Smoker    Types: Cigars    Quit date: 09/03/1978  . Smokeless tobacco: Current User    Types: Chew     Comment: 02/17/2013 "I aien't smoked a cigar in 20-30 yr"  . Alcohol Use: No  . Drug Use: No  . Sexual Activity: No   Other Topics Concern  . Not on file   Social History Narrative   2nd grade education. Married '58, '95. 2 dtr '59, '64, youngest daughter  died septic kidney.    Lives alone, handles all ADLs      Has living will   Daughter [name] is health care POA   Would accept resuscitation attempts--- but no prolonged ventilation.   Not sure about tube feeds.               Review of Systems Weight  is up slightly Appetite is okay      Objective:   Physical Exam  Constitutional: He appears well-developed and well-nourished. No distress.  Cardiovascular: Normal rate, regular rhythm, normal heart sounds and intact distal pulses.  Exam reveals no gallop.   No murmur heard. Pulmonary/Chest: Effort normal. No respiratory distress. He has no wheezes.  Faint bibasilar crackles  Abdominal: Soft. There is no tenderness.  Musculoskeletal: He exhibits no tenderness.  Trace foot edema  Neurological:  Normal fine touch sensation in feet  Psychiatric: He has a normal mood and affect. His behavior is normal.          Assessment & Plan:

## 2013-12-21 NOTE — Assessment & Plan Note (Signed)
Stable fluid status on the furosemide

## 2013-12-21 NOTE — Progress Notes (Signed)
Pre visit review using our clinic review tool, if applicable. No additional management support is needed unless otherwise documented below in the visit note. 

## 2013-12-21 NOTE — Assessment & Plan Note (Signed)
Mild and stable No Rx other than BP and cholesterol

## 2013-12-21 NOTE — Assessment & Plan Note (Signed)
Will wean off the prednisone Given diet info Consider increasing metformin

## 2013-12-21 NOTE — Assessment & Plan Note (Signed)
Will check levels on oral supplements

## 2013-12-21 NOTE — Assessment & Plan Note (Signed)
BP Readings from Last 3 Encounters:  12/21/13 120/60  12/13/13 113/70  11/24/13 122/80   Good control

## 2013-12-21 NOTE — Assessment & Plan Note (Signed)
Will wean off the prednisone Can also try off glucosamine to see if that is really helping

## 2013-12-22 ENCOUNTER — Encounter: Payer: Self-pay | Admitting: *Deleted

## 2013-12-23 ENCOUNTER — Other Ambulatory Visit: Payer: Medicare HMO

## 2014-01-08 ENCOUNTER — Encounter: Payer: Self-pay | Admitting: Internal Medicine

## 2014-01-08 ENCOUNTER — Ambulatory Visit (INDEPENDENT_AMBULATORY_CARE_PROVIDER_SITE_OTHER): Payer: Commercial Managed Care - HMO | Admitting: Internal Medicine

## 2014-01-08 VITALS — BP 138/60 | HR 93 | Temp 98.0°F | Wt 155.0 lb

## 2014-01-08 DIAGNOSIS — L98499 Non-pressure chronic ulcer of skin of other sites with unspecified severity: Secondary | ICD-10-CM

## 2014-01-08 DIAGNOSIS — Z23 Encounter for immunization: Secondary | ICD-10-CM

## 2014-01-08 MED ORDER — SILVER SULFADIAZINE 1 % EX CREA: 1.0000 "application " | TOPICAL_CREAM | Freq: Every day | CUTANEOUS | Status: DC

## 2014-01-08 NOTE — Addendum Note (Signed)
Addended by: SMITH, DESHANNON L on: 01/08/2014 03:19 PM   Modules accepted: Orders  

## 2014-01-08 NOTE — Assessment & Plan Note (Signed)
From trauma No secondary infection Multiple ecchymotic areas on arm as well but no other open areas Td updated  PROCEDURE Devitalized skin removed with scalpel and sterile forceps Wound dressed with silvadene/sterile gauze and cling Showed daughter how to do this

## 2014-01-08 NOTE — Progress Notes (Signed)
Subjective:    Patient ID: [name] Elliott, male    DOB: 12-15-1927, 80 y.o.   MRN: [id number]  HPI Here with daughter [name] Elliott backwards last night pulling something out of the lawnmower Ripped up left arm pretty bad No significant pain Started cleaning with alcohol but hurt too bad---just covered  Some drainage through dressing  Current Outpatient Prescriptions on File Prior to Visit  Medication Sig Dispense Refill  . aspirin EC 81 MG tablet Take 81 mg by mouth every morning.       . cholecalciferol (VITAMIN D) 1000 UNITS tablet Take 1,000 Units by mouth every morning.       . diclofenac sodium (VOLTAREN) 1 % GEL Apply 4 g topically 4 (four) times daily. To each knee  3 Tube  2  . furosemide (LASIX) 40 MG tablet Take 1 tablet (40 mg total) by mouth 2 (two) times daily.  180 tablet  3  . GLUCOSAMINE PO Take 1 tablet by mouth every morning.       . isosorbide mononitrate (IMDUR) 60 MG 24 hr tablet Take 1 tablet (60 mg total) by mouth every morning.  90 tablet  1  . lovastatin (MEVACOR) 20 MG tablet Take 1 tablet (20 mg total) by mouth at bedtime.  90 tablet  3  . metFORMIN (GLUCOPHAGE) 500 MG tablet Take 1 tablet (500 mg total) by mouth 2 (two) times daily with a meal.  60 tablet  5  . omeprazole (PRILOSEC) 20 MG capsule Take 1 capsule (20 mg total) by mouth 2 (two) times daily before a meal.  180 capsule  3  . potassium chloride SA (K-DUR,KLOR-CON) 20 MEQ tablet Take 20 mEq by mouth 2 (two) times daily.      . predniSONE (DELTASONE) 20 MG tablet Take 10 mg by mouth every other day.       . [DISCONTINUED] Hyoscyamine Sulfate (HYOMAX-DT) 0.375 MG TBCR Take 1 tab bid for 5 days, then prn abd pain  30 each  1   No current facility-administered medications on file prior to visit.    Allergies  Allergen Reactions  . Crestor [Rosuvastatin Calcium] Other (See Comments)    Muscle pain/aches    Past Medical History  Diagnosis Date  . Constipation, chronic   . Sebaceous cyst   . Other  malaise and fatigue   . Pain in joint, shoulder region     Has chronic shoulder pain  . Persistent disorder of initiating or maintaining sleep   . Thrombocytopenia, unspecified   . Vascular dementia without behavioral disturbance     with periods of amnesia  . Other B-complex deficiencies   . Hypothyroidism   . Pain in limb   . Esophageal reflux   . Unspecified essential hypertension   . Arthropathy, unspecified, site unspecified   . Pure hypercholesterolemia   . Orthostasis   . PVC's (premature ventricular contractions)   . S/P cardiac cath 08/08/11    mild to moderate CAD primarily in the LAD. None are obstructive and appear stable from prior cath in 2007; managed medically  . Arthritis   . CAD (coronary artery disease)     last cath in 2012. Managed medically-some blockages  . Headache(784.0)   . 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 MRI Nov '11 - tendonosis and partial tear. Left shoulder surgery Jan '12 for partial shoulder replacement. [name])   . Blood dyscrasia     thromboctopenia  . Pneumonia 1980's?; 2011  . History  of stomach ulcers 11/2012  . Chronic lower back pain     Past Surgical History  Procedure Laterality Date  . Hand surgery Right     crush injury, right fifth digit contracture, limited  motion  . Lumbar spine surgery  2007    Dr Philip Aspen ([place])  . Knee surgery Left 1991    "did it twice in 1 wk" (02/17/2013)  . Cataract extraction Left 2009  . Shoulder open rotator cuff repair Left 8.30.2011  . US echocardiography  02-28-2010    Est EF 50-55%  . Cardiac catheterization  08/2011  . Shoulder hemi-arthroplasty    . Finger amputation Right     pinky finger  . Hardware removal  02/05/2012    Procedure: HARDWARE REMOVAL;  Surgeon: Johnny Bridge, MD;  Location: [facility];  Service: Orthopedics;  Laterality: Left;  . Reverse shoulder arthroplasty  02/05/2012    Procedure: REVERSE SHOULDER ARTHROPLASTY;  Surgeon: Johnny Bridge, MD;  Location: [facility];  Service: Orthopedics;  Laterality: Left;  . Back surgery    . Esophagogastroduodenoscopy N/A 11/19/2012    Procedure: ESOPHAGOGASTRODUODENOSCOPY (EGD);  Surgeon: Jerene Bears, MD;  Location: [facility];  Service: Gastroenterology;  Laterality: N/A;    Family History  Problem Relation Age of Onset  . Breast cancer Mother   . Cancer Brother     throat  . Diabetes Daughter   . Colon cancer Neg Hx   . Kidney disease Father     History   Social History  . Marital Status: Widowed    Spouse Name: N/A    Number of Children: 2  . Years of Education: N/A   Occupational History  . retired    Social History Main Topics  . Smoking status: Former Smoker    Types: Cigars    Quit date: 09/03/1978  . Smokeless tobacco: Current User    Types: Chew     Comment: 02/17/2013 "I aien't smoked a cigar in 20-30 yr"  . Alcohol Use: No  . Drug Use: No  . Sexual Activity: No   Other Topics Concern  . Not on file   Social History Narrative   2nd grade education. Married '58, '95. 2 dtr '59, '64, youngest daughter died septic kidney.    Lives alone, handles all ADLs      Has living will   Daughter [name] Elliott is health care POA   Would accept resuscitation attempts--- but no prolonged ventilation.   Not sure about tube feeds.               Review of Systems No dizziness No fever     Objective:   Physical Exam  Skin:  Entire extensor left elbow is open with divitalized skin No surrounding erythema or warmth          Assessment & Plan:

## 2014-01-08 NOTE — Progress Notes (Signed)
Pre visit review using our clinic review tool, if applicable. No additional management support is needed unless otherwise documented below in the visit note. 

## 2014-01-18 ENCOUNTER — Ambulatory Visit (INDEPENDENT_AMBULATORY_CARE_PROVIDER_SITE_OTHER): Payer: Commercial Managed Care - HMO | Admitting: Internal Medicine

## 2014-01-18 ENCOUNTER — Telehealth: Payer: Self-pay

## 2014-01-18 ENCOUNTER — Encounter: Payer: Self-pay | Admitting: Internal Medicine

## 2014-01-18 VITALS — BP 110/70 | HR 89 | Temp 99.4°F | Wt 154.0 lb

## 2014-01-18 DIAGNOSIS — M791 Myalgia, unspecified site: Secondary | ICD-10-CM | POA: Insufficient documentation

## 2014-01-18 DIAGNOSIS — IMO0001 Reserved for inherently not codable concepts without codable children: Secondary | ICD-10-CM

## 2014-01-18 MED ORDER — DOXYCYCLINE HYCLATE 100 MG PO TABS: 100.0000 mg | ORAL_TABLET | Freq: Two times a day (BID) | ORAL | Status: DC

## 2014-01-18 MED ORDER — DOXYCYCLINE MONOHYDRATE 100 MG PO TABS: 100.0000 mg | ORAL_TABLET | Freq: Two times a day (BID) | ORAL | Status: DC

## 2014-01-18 NOTE — Telephone Encounter (Signed)
Spoke with pharmacist and changed rx and sent by e-script

## 2014-01-18 NOTE — Progress Notes (Signed)
Subjective:    Patient ID: [name] Elliott, male    DOB: 1928/05/02, 80 y.o.   MRN: [id number]  HPI Here with granddaughter Left arm did heal well  Having trouble with leg pain and trouble walking Got bad yesterday ----"real hot" when trying to put shoes on Was milder before then  Had tick removed from back 2 days ago Engorged and deeply embedded  No fever Feels sick--all over Trouble even keeping his eyes open  No cough  No SOB  Current Outpatient Prescriptions on File Prior to Visit  Medication Sig Dispense Refill  . aspirin EC 81 MG tablet Take 81 mg by mouth every morning.       . cholecalciferol (VITAMIN D) 1000 UNITS tablet Take 1,000 Units by mouth every morning.       . diclofenac sodium (VOLTAREN) 1 % GEL Apply 4 g topically 4 (four) times daily. To each knee  3 Tube  2  . furosemide (LASIX) 40 MG tablet Take 1 tablet (40 mg total) by mouth 2 (two) times daily.  180 tablet  3  . GLUCOSAMINE PO Take 1 tablet by mouth every morning.       . isosorbide mononitrate (IMDUR) 60 MG 24 hr tablet Take 1 tablet (60 mg total) by mouth every morning.  90 tablet  1  . lovastatin (MEVACOR) 20 MG tablet Take 1 tablet (20 mg total) by mouth at bedtime.  90 tablet  3  . metFORMIN (GLUCOPHAGE) 500 MG tablet Take 1 tablet (500 mg total) by mouth 2 (two) times daily with a meal.  60 tablet  5  . omeprazole (PRILOSEC) 20 MG capsule Take 1 capsule (20 mg total) by mouth 2 (two) times daily before a meal.  180 capsule  3  . potassium chloride SA (K-DUR,KLOR-CON) 20 MEQ tablet Take 20 mEq by mouth 2 (two) times daily.      . predniSONE (DELTASONE) 20 MG tablet Take 10 mg by mouth every other day.       . silver sulfADIAZINE (SILVADENE) 1 % cream Apply 1 application topically daily.  400 g  1  . [DISCONTINUED] Hyoscyamine Sulfate (HYOMAX-DT) 0.375 MG TBCR Take 1 tab bid for 5 days, then prn abd pain  30 each  1   No current facility-administered medications on file prior to visit.     Allergies  Allergen Reactions  . Crestor [Rosuvastatin Calcium] Other (See Comments)    Muscle pain/aches    Past Medical History  Diagnosis Date  . Constipation, chronic   . Sebaceous cyst   . Other malaise and fatigue   . Pain in joint, shoulder region     Has chronic shoulder pain  . Persistent disorder of initiating or maintaining sleep   . Thrombocytopenia, unspecified   . Vascular dementia without behavioral disturbance     with periods of amnesia  . Other B-complex deficiencies   . Hypothyroidism   . Pain in limb   . Esophageal reflux   . Unspecified essential hypertension   . Arthropathy, unspecified, site unspecified   . Pure hypercholesterolemia   . Orthostasis   . PVC's (premature ventricular contractions)   . S/P cardiac cath 08/08/11    mild to moderate CAD primarily in the LAD. None are obstructive and appear stable from prior cath in 2007; managed medically  . Arthritis   . CAD (coronary artery disease)     last cath in 2012. Managed medically-some blockages  . Headache(784.0)   . Failed  arthroplasty, shoulder 01/01/2012    H/o humeral fracture. MRI Nov '11 - tendonosis and partial tear. Left shoulder surgery Jan '12 for partial shoulder replacement. [name])   . Blood dyscrasia     thromboctopenia  . Pneumonia 1980's?; 2011  . History of stomach ulcers 11/2012  . Chronic lower back pain     Past Surgical History  Procedure Laterality Date  . Hand surgery Right     crush injury, right fifth digit contracture, limited  motion  . Lumbar spine surgery  2007    Dr Philip Aspen ([place])  . Knee surgery Left 1991    "did it twice in 1 wk" (02/17/2013)  . Cataract extraction Left 2009  . Shoulder open rotator cuff repair Left 8.30.2011  . US echocardiography  02-28-2010    Est EF 50-55%  . Cardiac catheterization  08/2011  . Shoulder hemi-arthroplasty    . Finger amputation Right     pinky finger  . Hardware removal  02/05/2012    Procedure:  HARDWARE REMOVAL;  Surgeon: Johnny Bridge, MD;  Location: [facility];  Service: Orthopedics;  Laterality: Left;  . Reverse shoulder arthroplasty  02/05/2012    Procedure: REVERSE SHOULDER ARTHROPLASTY;  Surgeon: Johnny Bridge, MD;  Location: [facility];  Service: Orthopedics;  Laterality: Left;  . Back surgery    . Esophagogastroduodenoscopy N/A 11/19/2012    Procedure: ESOPHAGOGASTRODUODENOSCOPY (EGD);  Surgeon: Jerene Bears, MD;  Location: [facility];  Service: Gastroenterology;  Laterality: N/A;    Family History  Problem Relation Age of Onset  . Breast cancer Mother   . Cancer Brother     throat  . Diabetes Daughter   . Colon cancer Neg Hx   . Kidney disease Father     History   Social History  . Marital Status: Widowed    Spouse Name: N/A    Number of Children: 2  . Years of Education: N/A   Occupational History  . retired    Social History Main Topics  . Smoking status: Former Smoker    Types: Cigars    Quit date: 09/03/1978  . Smokeless tobacco: Current User    Types: Chew     Comment: 02/17/2013 "I aien't smoked a cigar in 20-30 yr"  . Alcohol Use: No  . Drug Use: No  . Sexual Activity: No   Other Topics Concern  . Not on file   Social History Narrative   2nd grade education. Married '58, '95. 2 dtr '59, '64, youngest daughter died septic kidney.    Lives alone, handles all ADLs      Has living will   Daughter [name] is health care POA   Would accept resuscitation attempts--- but no prolonged ventilation.   Not sure about tube feeds.               Review of Systems Appetite is off No nausea or vomiting  Notes phelgm in throat---has to hack it out    Objective:   Physical Exam  Constitutional: He appears well-developed. No distress.  Musculoskeletal:  1+ edema in ankles Mild tenderness along anterior right calf No posterior tenderness and no thigh tenderness  Skin:  Left arm has granulated well No infection at tick bite site           Assessment & Plan:

## 2014-01-18 NOTE — Telephone Encounter (Signed)
[facility] left v/m requesting clarification; doxycycline sent electronically to [facility] appeared to be for extended release which are expensive. Is pt to get Doxycycline regular release or extended release; [facility] request cb.

## 2014-01-18 NOTE — Telephone Encounter (Signed)
Should be whichever is cheapest doxycycline I had heard the hyclate form had gone up in price so I switched

## 2014-01-18 NOTE — Assessment & Plan Note (Addendum)
Has history of neuropathy but this is acute and since embedded, engorged tick removed Will treat as possible tick fever--though unlikely No other signs of infection No new trauma, etc  Has finally weaned off the prednisone Symptoms not compatible with PMR--but may need to restart if ongoing muscle pain

## 2014-01-29 ENCOUNTER — Telehealth: Payer: Self-pay

## 2014-01-29 NOTE — Telephone Encounter (Signed)
[name] pts daughter spoke with [facility] and wanted Dr Silvio Pate to be aware [facility] will be faxing a form to Dr Silvio Pate for free diabetic supplies including a new meter,test strips and lancets. [name] has not checked with pts ins for coverage; [name] was told by [facility] everything would be free. [name] request form completed when received.

## 2014-02-01 ENCOUNTER — Telehealth: Payer: Self-pay

## 2014-02-01 NOTE — Telephone Encounter (Signed)
Does she mean to an orthopedist? It didn't seem to be joint pains, mostly muscular

## 2014-02-01 NOTE — Telephone Encounter (Signed)
[name] Elliott pts daughter left v/m; pt having a lot of problems with feet and leg pain and problmes walkiing pt was seen on 01/18/14. [name] Elliott wants to know if referral could be done; pt is no better and [name] Elliott wants to know if diabetes could be causing this leg and foot pain. [facility] request cb.

## 2014-02-02 ENCOUNTER — Telehealth: Payer: Self-pay

## 2014-02-02 MED ORDER — POTASSIUM CHLORIDE CRYS ER 20 MEQ PO TBCR: 20.0000 meq | EXTENDED_RELEASE_TABLET | Freq: Two times a day (BID) | ORAL | Status: DC

## 2014-02-02 NOTE — Telephone Encounter (Signed)
Spoke with daughter and she wanted to know what could be causing the foot and leg pain? Daughter states pt is taking 900mg  of gabapentin daily, I advised this wasn't on his med list and she states Dr. Linda Hedges prescribed this for neuropathy? This was taken off his med list 11/24/13 by [facility] office, but pt still taking, please advise

## 2014-02-02 NOTE — Telephone Encounter (Signed)
If it is a burning pain, then it may be from diabetes. We hadn't spoken about that pain the first time I met him so I wasn't aware of it not being a new thing (and the med wasn't on his list)  If it is from the diabetes, a referral won't help. We need to be more aggressive with the meds First step would be to increase the gabapentin to 300 bid and 600 at bedtime A visit would be needed if that doesn't help, to discuss other alternatives  If it is more muscular pain, we may need to consider trying off the cholesterol medication

## 2014-02-02 NOTE — Telephone Encounter (Signed)
Form faxed and scanned.

## 2014-02-02 NOTE — Telephone Encounter (Signed)
Form done 

## 2014-02-02 NOTE — Telephone Encounter (Signed)
.  left message to have patient return my call.  

## 2014-02-02 NOTE — Telephone Encounter (Signed)
[name] left v/m; [name] requested K from [facility]; [name] spoke with [facility] today and was told the reason K had not been sent pharmacy waiting on OK for refill from doctor. Med was refilled earlier today to [facility]. [name] notified and voiced understanding.

## 2014-02-02 NOTE — Telephone Encounter (Signed)
Form on your desk  

## 2014-02-02 NOTE — Telephone Encounter (Signed)
Spoke with daughter and advised results. She will try the change and call if anything changes.Marland KitchenMarland Kitchen

## 2014-02-04 ENCOUNTER — Ambulatory Visit: Payer: Medicare HMO | Admitting: Internal Medicine

## 2014-02-05 ENCOUNTER — Telehealth: Payer: Self-pay

## 2014-02-05 MED ORDER — OXYCODONE-ACETAMINOPHEN 5-325 MG PO TABS: 1.0000 | ORAL_TABLET | Freq: Three times a day (TID) | ORAL | Status: DC | PRN

## 2014-02-05 NOTE — Telephone Encounter (Signed)
Rx done I prescribed a lower dose that I feel is more appropriate at his age

## 2014-02-05 NOTE — Telephone Encounter (Signed)
Spoke with patient and advised rx ready for pick-up and it will be at the front desk.  

## 2014-02-05 NOTE — Telephone Encounter (Signed)
[name] Elliott pts daughter left v/m;[name] Elliott said Dr Linda Hedges gave pt oxycodone when having pain in feet and legs and pt having a lot of pain in feet and legs; request rx oxycodone. Call when ready for pick up. Not on current med list but different oxycodone rx on historical med list.Please advise.

## 2014-02-16 NOTE — Telephone Encounter (Signed)
[name] said that pt cannot get diabetic supplies from [facility] and request meter, diab test strips and lancets sent to [facility]. [name] will ck with ins co to ck coverage for diabetic supplies and will cb with request.

## 2014-02-17 NOTE — Telephone Encounter (Signed)
[name] Elliott left v/m; pt spoke with [employer] [facility] and a fax will be sent for diabetic supplies for approval. [name] Elliott request when fax received to complete and send back so pt can get diabetic supplies.

## 2014-02-19 MED ORDER — BD SWAB SINGLE USE REGULAR PADS: MEDICATED_PAD | Status: DC

## 2014-02-19 MED ORDER — ACCU-CHEK AVIVA DEVI: Status: DC

## 2014-02-19 MED ORDER — ACCU-CHEK SOFTCLIX LANCETS MISC: Status: DC

## 2014-02-19 MED ORDER — ACCU-CHEK AVIVA VI SOLN: Status: DC

## 2014-02-19 MED ORDER — GLUCOSE BLOOD VI STRP: ORAL_STRIP | Status: DC

## 2014-02-19 NOTE — Addendum Note (Signed)
Addended by: Despina Hidden on: 02/19/2014 11:55 AM   Modules accepted: Orders

## 2014-02-19 NOTE — Telephone Encounter (Signed)
rx sent to pharmacy by e-script  

## 2014-03-23 ENCOUNTER — Ambulatory Visit (INDEPENDENT_AMBULATORY_CARE_PROVIDER_SITE_OTHER): Payer: Commercial Managed Care - HMO | Admitting: Internal Medicine

## 2014-03-23 ENCOUNTER — Encounter: Payer: Self-pay | Admitting: Internal Medicine

## 2014-03-23 VITALS — BP 120/70 | HR 75 | Temp 98.6°F | Wt 158.0 lb

## 2014-03-23 DIAGNOSIS — E1165 Type 2 diabetes mellitus with hyperglycemia: Secondary | ICD-10-CM

## 2014-03-23 DIAGNOSIS — N183 Chronic kidney disease, stage 3 unspecified: Secondary | ICD-10-CM

## 2014-03-23 DIAGNOSIS — I498 Other specified cardiac arrhythmias: Secondary | ICD-10-CM

## 2014-03-23 DIAGNOSIS — I519 Heart disease, unspecified: Secondary | ICD-10-CM

## 2014-03-23 DIAGNOSIS — I5189 Other ill-defined heart diseases: Secondary | ICD-10-CM

## 2014-03-23 DIAGNOSIS — F0150 Vascular dementia without behavioral disturbance: Secondary | ICD-10-CM

## 2014-03-23 DIAGNOSIS — E1129 Type 2 diabetes mellitus with other diabetic kidney complication: Secondary | ICD-10-CM

## 2014-03-23 LAB — RENAL FUNCTION PANEL
Albumin: 3.7 g/dL (ref 3.5–5.2)
BUN: 17 mg/dL (ref 6–23)
CO2: 24 mEq/L (ref 19–32)
Calcium: 9.7 mg/dL (ref 8.4–10.5)
Chloride: 101 mEq/L (ref 96–112)
Creatinine, Ser: 1.1 mg/dL (ref 0.4–1.5)
GFR: 65.09 mL/min (ref >60.00)
Glucose, Bld: 107 mg/dL — ABNORMAL HIGH (ref 70–99)
Phosphorus: 2.9 mg/dL (ref 2.3–4.6)
Potassium: 3.7 mEq/L (ref 3.5–5.1)
Sodium: 136 mEq/L (ref 135–145)

## 2014-03-23 LAB — HEMOGLOBIN A1C: HEMOGLOBIN A1C: 7.6 % — AB (ref 4.6–6.5)

## 2014-03-23 MED ORDER — OXYCODONE-ACETAMINOPHEN 5-325 MG PO TABS: 1.0000 | ORAL_TABLET | Freq: Three times a day (TID) | ORAL | Status: DC | PRN

## 2014-03-23 MED ORDER — FUROSEMIDE 80 MG PO TABS: 80.0000 mg | ORAL_TABLET | Freq: Every day | ORAL | Status: DC

## 2014-03-23 NOTE — Progress Notes (Signed)
Pre visit review using our clinic review tool, if applicable. No additional management support is needed unless otherwise documented below in the visit note. 

## 2014-03-23 NOTE — Assessment & Plan Note (Signed)
No change in memory or function

## 2014-03-23 NOTE — Assessment & Plan Note (Signed)
Sounded like a fib but has short and regular P-R interval and lots of ectopy

## 2014-03-23 NOTE — Assessment & Plan Note (Signed)
With increased edema Will increase the furosemide Discussed no added salt in diet Check renal function again

## 2014-03-23 NOTE — Patient Instructions (Signed)
Please increase the furosemide to 80mg  daily (take 2 of the 40mg  dose till the new ones arrive from [facility])  Low-Sodium Eating Plan Sodium raises blood pressure and causes water to be held in the body. Getting less sodium from food will help lower your blood pressure, reduce any swelling, and protect your heart, liver, and kidneys. We get sodium by adding salt (sodium chloride) to food. Most of our sodium comes from canned, boxed, and frozen foods. Restaurant foods, fast foods, and pizza are also very high in sodium. Even if you take medicine to lower your blood pressure or to reduce fluid in your body, getting less sodium from your food is important. WHAT IS MY PLAN? Most people should limit their sodium intake to 2,300 mg a day. Your health care provider recommends that you limit your sodium intake to __________ a day.  WHAT DO I NEED TO KNOW ABOUT THIS EATING PLAN? For the low-sodium eating plan, you will follow these general guidelines:  Choose foods with a % Daily Value for sodium of less than 5% (as listed on the food label).   Use salt-free seasonings or herbs instead of table salt or sea salt.   Check with your health care provider or pharmacist before using salt substitutes.   Eat fresh foods.  Eat more vegetables and fruits.  Limit canned vegetables. If you do use them, rinse them well to decrease the sodium.   Limit cheese to 1 oz (28 g) per day.   Eat lower-sodium products, often labeled as "lower sodium" or "no salt added."  Avoid foods that contain monosodium glutamate (MSG). MSG is sometimes added to [place] food and some canned foods.  Check food labels (Nutrition Facts labels) on foods to learn how much sodium is in one serving.  Eat more home-cooked food and less restaurant, buffet, and fast food.  When eating at a restaurant, ask that your food be prepared with less salt or none, if possible.  HOW DO I READ FOOD LABELS FOR SODIUM INFORMATION? The  Nutrition Facts label lists the amount of sodium in one serving of the food. If you eat more than one serving, you must multiply the listed amount of sodium by the number of servings. Food labels may also identify foods as:  Sodium free--Less than 5 mg in a serving.  Very low sodium--35 mg or less in a serving.  Low sodium--140 mg or less in a serving.  Light in sodium--50% less sodium in a serving. For example, if a food that usually has 300 mg of sodium is changed to become light in sodium, it will have 150 mg of sodium.  Reduced sodium--25% less sodium in a serving. For example, if a food that usually has 400 mg of sodium is changed to reduced sodium, it will have 300 mg of sodium. WHAT FOODS CAN I EAT? Grains Low-sodium cereals, including oats, puffed wheat and rice, and shredded wheat cereals. Low-sodium crackers. Unsalted rice and pasta. Lower-sodium bread.  Vegetables Frozen or fresh vegetables. Low-sodium or reduced-sodium canned vegetables. Low-sodium or reduced-sodium tomato sauce and paste. Low-sodium or reduced-sodium tomato and vegetable juices.  Fruits Fresh, frozen, and canned fruit. Fruit juice.  Meat and Other Protein Products Low-sodium canned tuna and salmon. Fresh or frozen meat, poultry, seafood, and fish. Lamb. Unsalted nuts. Dried beans, peas, and lentils without added salt. Unsalted canned beans. Homemade soups without salt. Eggs.  Dairy Milk. Soy milk. Ricotta cheese. Low-sodium or reduced-sodium cheeses. Yogurt.  Condiments Fresh and dried  herbs and spices. Salt-free seasonings. Onion and garlic powders. Low-sodium varieties of mustard and ketchup. Lemon juice.  Fats and Oils Reduced-sodium salad dressings. Unsalted butter.  Other Unsalted popcorn and pretzels.  The items listed above may not be a complete list of recommended foods or beverages. Contact your dietitian for more options. WHAT FOODS ARE NOT RECOMMENDED? Grains Instant hot cereals.  Bread stuffing, pancake, and biscuit mixes. Croutons. Seasoned rice or pasta mixes. Noodle soup cups. Boxed or frozen macaroni and cheese. Self-rising flour. Regular salted crackers. Vegetables Regular canned vegetables. Regular canned tomato sauce and paste. Regular tomato and vegetable juices. Frozen vegetables in sauces. Salted french fries. Olives. Angie Fava. Relishes. Sauerkraut. Salsa. Meat and Other Protein Products Salted, canned, smoked, spiced, or pickled meats, seafood, or fish. Bacon, ham, sausage, hot dogs, corned beef, chipped beef, and packaged luncheon meats. Salt pork. Jerky. Pickled herring. Anchovies, regular canned tuna, and sardines. Salted nuts. Dairy Processed cheese and cheese spreads. Cheese curds. Blue cheese and cottage cheese. Buttermilk.  Condiments Onion and garlic salt, seasoned salt, table salt, and sea salt. Canned and packaged gravies. Worcestershire sauce. Tartar sauce. Barbecue sauce. Teriyaki sauce. Soy sauce, including reduced sodium. Steak sauce. Fish sauce. Oyster sauce. Cocktail sauce. Horseradish. Regular ketchup and mustard. Meat flavorings and tenderizers. Bouillon cubes. Hot sauce. Tabasco sauce. Marinades. Taco seasonings. Relishes. Fats and Oils Regular salad dressings. Salted butter. Margarine. Ghee. Bacon fat.  Other Potato and tortilla chips. Corn chips and puffs. Salted popcorn and pretzels. Canned or dried soups. Pizza. Frozen entrees and pot pies.  The items listed above may not be a complete list of foods and beverages to avoid. Contact your dietitian for more information. Document Released: 02/09/2002 Document Revised: 08/25/2013 Document Reviewed: 06/24/2013 [facility] Patient Information 2015 [place], [place]. This information is not intended to replace advice given to you by your health care provider. Make sure you discuss any questions you have with your health care provider.

## 2014-03-23 NOTE — Assessment & Plan Note (Signed)
Hopefully will be better off the prednisone Goal under 8% but considering his overall status and feeling bad in AM, I would be concerned about adding other agents

## 2014-03-23 NOTE — Progress Notes (Signed)
Subjective:    Patient ID: [name] Elliott, male    DOB: [birth year], 80 y.o.   MRN: [id number]  HPI Here with granddaughter  Having trouble with legs--swelling lately Has been taking the furosemide Legs painful but no myalgias otherwise  Checks blood sugar in the morning Has been running low in AM--- doesn't feel good and doesn't get better after eating Tries to avoid sweets  No chest pain No need for nitro Does have some upper right flank pain---very brief (?heartburn)  Uses oxycodone prn only Not daily  Current Outpatient Prescriptions on File Prior to Visit  Medication Sig Dispense Refill  . ACCU-CHEK SOFTCLIX LANCETS lancets Use as instructed to test blood sugar once daily dx: 250.42  200 each  1  . Alcohol Swabs (B-D SINGLE USE SWABS REGULAR) PADS Use as instructed to test blood sugar once daily dx: 250.42  200 each  2  . aspirin EC 81 MG tablet Take 81 mg by mouth every morning.       . Blood Glucose Calibration (ACCU-CHEK AVIVA) SOLN Use to calibrate blood glucose meter dx 250.42  1 each  0  . cholecalciferol (VITAMIN D) 1000 UNITS tablet Take 1,000 Units by mouth every morning.       . furosemide (LASIX) 40 MG tablet Take 1 tablet (40 mg total) by mouth 2 (two) times daily.  180 tablet  3  . gabapentin (NEURONTIN) 300 MG capsule Take 300 mg by mouth 3 (three) times daily.      Marland Kitchen GLUCOSAMINE PO Take 1 tablet by mouth every morning.       Marland Kitchen glucose blood (ACCU-CHEK AVIVA PLUS) test strip Use as instructed to test blood sugar once daily dx: 250.42  200 each  2  . isosorbide mononitrate (IMDUR) 60 MG 24 hr tablet Take 1 tablet (60 mg total) by mouth every morning.  90 tablet  1  . lovastatin (MEVACOR) 20 MG tablet Take 1 tablet (20 mg total) by mouth at bedtime.  90 tablet  3  . metFORMIN (GLUCOPHAGE) 500 MG tablet Take 1 tablet (500 mg total) by mouth 2 (two) times daily with a meal.  60 tablet  5  . omeprazole (PRILOSEC) 20 MG capsule Take 1 capsule (20 mg total) by mouth 2  (two) times daily before a meal.  180 capsule  3  . potassium chloride SA (K-DUR,KLOR-CON) 20 MEQ tablet Take 1 tablet (20 mEq total) by mouth 2 (two) times daily.  180 tablet  3  . [DISCONTINUED] Hyoscyamine Sulfate (HYOMAX-DT) 0.375 MG TBCR Take 1 tab bid for 5 days, then prn abd pain  30 each  1   No current facility-administered medications on file prior to visit.    Allergies  Allergen Reactions  . Crestor [Rosuvastatin Calcium] Other (See Comments)    Muscle pain/aches    Past Medical History  Diagnosis Date  . Constipation, chronic   . Sebaceous cyst   . Other malaise and fatigue   . Pain in joint, shoulder region     Has chronic shoulder pain  . Persistent disorder of initiating or maintaining sleep   . Thrombocytopenia, unspecified   . Vascular dementia without behavioral disturbance     with periods of amnesia  . Other B-complex deficiencies   . Hypothyroidism   . Pain in limb   . Esophageal reflux   . Unspecified essential hypertension   . Arthropathy, unspecified, site unspecified   . Pure hypercholesterolemia   . Orthostasis   .  PVC's (premature ventricular contractions)   . S/P cardiac cath 08/08/11    mild to moderate CAD primarily in the LAD. None are obstructive and appear stable from prior cath in 2007; managed medically  . Arthritis   . CAD (coronary artery disease)     last cath in 2012. Managed medically-some blockages  . Headache(784.0)   . 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 MRI Nov '11 - tendonosis and partial tear. Left shoulder surgery Jan '12 for partial shoulder replacement. [name])   . Blood dyscrasia     thromboctopenia  . Pneumonia 1980's?; 2011  . History of stomach ulcers 11/2012  . Chronic lower back pain     Past Surgical History  Procedure Laterality Date  . Hand surgery Right     crush injury, right fifth digit contracture, limited  motion  . Lumbar spine surgery  2007    Dr Philip Aspen  ([place])  . Knee surgery Left 1991    "did it twice in 1 wk" (02/17/2013)  . Cataract extraction Left 2009  . Shoulder open rotator cuff repair Left 8.30.2011  . US echocardiography  02-28-2010    Est EF 50-55%  . Cardiac catheterization  08/2011  . Shoulder hemi-arthroplasty    . Finger amputation Right     pinky finger  . Hardware removal  02/05/2012    Procedure: HARDWARE REMOVAL;  Surgeon: Johnny Bridge, MD;  Location: [facility];  Service: Orthopedics;  Laterality: Left;  . Reverse shoulder arthroplasty  02/05/2012    Procedure: REVERSE SHOULDER ARTHROPLASTY;  Surgeon: Johnny Bridge, MD;  Location: [facility];  Service: Orthopedics;  Laterality: Left;  . Back surgery    . Esophagogastroduodenoscopy N/A 11/19/2012    Procedure: ESOPHAGOGASTRODUODENOSCOPY (EGD);  Surgeon: Jerene Bears, MD;  Location: [facility];  Service: Gastroenterology;  Laterality: N/A;    Family History  Problem Relation Age of Onset  . Breast cancer Mother   . Cancer Brother     throat  . Diabetes Daughter   . Colon cancer Neg Hx   . Kidney disease Father     History   Social History  . Marital Status: Widowed    Spouse Name: N/A    Number of Children: 2  . Years of Education: N/A   Occupational History  . retired    Social History Main Topics  . Smoking status: Former Smoker    Types: Cigars    Quit date: 09/03/1978  . Smokeless tobacco: Current User    Types: Chew     Comment: 02/17/2013 "I aien't smoked a cigar in 20-30 yr"  . Alcohol Use: No  . Drug Use: No  . Sexual Activity: No   Other Topics Concern  . Not on file   Social History Narrative   2nd grade education. Married '58, '95. 2 dtr '59, '64, youngest daughter died septic kidney.    Lives alone, handles all ADLs      Has living will   Daughter [name] is health care POA   Would accept resuscitation attempts--- but no prolonged ventilation.   Not sure about tube feeds.               Review of Systems Weight is up  4# Bowels regular---twice a day    Objective:   Physical Exam  Constitutional: He appears well-developed. No distress.  Neck: Normal range of motion. Neck supple. No thyromegaly present.  Cardiovascular: Normal rate, normal heart sounds and intact distal  pulses.  Exam reveals no gallop.   No murmur heard. irregular  Pulmonary/Chest: Effort normal. No respiratory distress. He has no wheezes. He has no rales.  Musculoskeletal:  2+ edema in feet and lower calves--some tenderness without sig redness/warmth on right  Lymphadenopathy:    He has no cervical adenopathy.  Skin:  No leg ulcers  Psychiatric: He has a normal mood and affect. His behavior is normal.          Assessment & Plan:

## 2014-03-23 NOTE — Assessment & Plan Note (Signed)
Will recheck labs 

## 2014-03-24 ENCOUNTER — Encounter: Payer: Self-pay | Admitting: *Deleted

## 2014-03-31 ENCOUNTER — Encounter: Payer: Self-pay | Admitting: Family Medicine

## 2014-03-31 ENCOUNTER — Telehealth: Payer: Self-pay | Admitting: Family Medicine

## 2014-03-31 ENCOUNTER — Ambulatory Visit (INDEPENDENT_AMBULATORY_CARE_PROVIDER_SITE_OTHER): Payer: Commercial Managed Care - HMO | Admitting: Family Medicine

## 2014-03-31 VITALS — BP 122/70 | HR 86 | Temp 97.9°F | Wt 156.5 lb

## 2014-03-31 DIAGNOSIS — R609 Edema, unspecified: Secondary | ICD-10-CM | POA: Insufficient documentation

## 2014-03-31 MED ORDER — LOVASTATIN 20 MG PO TABS: ORAL_TABLET | ORAL | Status: DC

## 2014-03-31 MED ORDER — GABAPENTIN 300 MG PO CAPS: 300.0000 mg | ORAL_CAPSULE | Freq: Four times a day (QID) | ORAL | Status: DC

## 2014-03-31 NOTE — Progress Notes (Signed)
Pre visit review using our clinic review tool, if applicable. No additional management support is needed unless otherwise documented below in the visit note.  Seen last week for BLE pain, R>L calf, not in the foot or the thigh B.  Still with swelling and pain.  Inc in pain but the oxycodone didn't make much difference.  Lasix increased and the swelling got some better.  Not lightheaded.  Pain walking, pain even if standing and not walking. Feels better resting and propping his feet up.  Lower legs are sore to the touch.   No fevers.    On statin, unclear if that is contributing to the current pain.  H/o crestor intolerance.  PMH and SH reviewed  ROS: See HPI, otherwise noncontributory.  Meds, vitals, and allergies reviewed.   nad Elderly male Mmm Neck supple, no LA rrr ctab abd soft Normal DP pulses B Tender but not hot calves B.  2+ BLE edema with chronic skin changes noted Varicose veins noted in the feet.

## 2014-03-31 NOTE — Telephone Encounter (Signed)
Pt's daughter called.  Pt was seen by Dr. Damita Dunnings last week.  Bilateral lower extremities red and inflamed, swelling and painful for a few days.  I told her that pt would need to be seen, she said that pt is only willing to see Dr. Silvio Pate.  I told her that he had a cancellation at 11:15 but if they could not make that appt he has open appts tomorrow afternoon and Friday morning.  She is going to speak with pt and call our office back to schedule appt.

## 2014-03-31 NOTE — Telephone Encounter (Signed)
Will await his evaluation

## 2014-03-31 NOTE — Telephone Encounter (Signed)
Pt's daughter called back .  She said that she could not get in touch with pt and requested appt with any available provider this afternoon.  I did not see an OV note by Dr. Damita Dunnings last week so I asked her about that and she said it was actually with Dr. Silvio Pate.  Appt scheduled for this afternoon with Dr. Damita Dunnings.

## 2014-03-31 NOTE — Patient Instructions (Signed)
Go to the lab on the way out.  We'll contact you with your lab report. Stop the lovastatin, try taking gabapentin up to 4 times a day.  Give [place] an update in a day or two.  Try to elevated your legs in the meantime and avoid salt.  Take care.

## 2014-03-31 NOTE — Assessment & Plan Note (Signed)
Recheck BMET.  We may be able to inc lasix, but I didn't want to do so w/o checking labs first.  Unclear if pain is solely from edema.  Could be from statin, hold that now.  Oxycodone didn't help the pain.  He describes a burning sensation. Will try up to 4 pills of gabapentin a day.   Prev note reviewed from PCP, when lasix was increased. Prev labs reviewed, with improved A1c noted.  Routed to PCP as FYI.

## 2014-04-01 ENCOUNTER — Other Ambulatory Visit: Payer: Self-pay | Admitting: Internal Medicine

## 2014-04-01 DIAGNOSIS — R609 Edema, unspecified: Secondary | ICD-10-CM

## 2014-04-01 LAB — BASIC METABOLIC PANEL
BUN: 27 mg/dL — ABNORMAL HIGH (ref 6–23)
CALCIUM: 9.5 mg/dL (ref 8.4–10.5)
CO2: 24 mEq/L (ref 19–32)
Chloride: 101 mEq/L (ref 96–112)
Creatinine, Ser: 1.5 mg/dL (ref 0.4–1.5)
GFR: 47.78 mL/min — AB (ref >60.00)
GLUCOSE: 180 mg/dL — AB (ref 70–99)
POTASSIUM: 4 meq/L (ref 3.5–5.1)
SODIUM: 136 meq/L (ref 135–145)

## 2014-04-06 ENCOUNTER — Telehealth: Payer: Self-pay | Admitting: Internal Medicine

## 2014-04-06 MED ORDER — OMEPRAZOLE 20 MG PO CPDR: 20.0000 mg | DELAYED_RELEASE_CAPSULE | Freq: Two times a day (BID) | ORAL | Status: DC

## 2014-04-06 MED ORDER — POTASSIUM CHLORIDE CRYS ER 20 MEQ PO TBCR: 20.0000 meq | EXTENDED_RELEASE_TABLET | Freq: Two times a day (BID) | ORAL | Status: DC

## 2014-04-06 NOTE — Telephone Encounter (Signed)
Pt uses [facility] keeps calling pt's daughter [name]) about [facility] refills.  They said they cannot fill his medications until they get the paperwork back that they have faxed to [place].  Please call [name] [phone]

## 2014-04-06 NOTE — Telephone Encounter (Signed)
Spoke with daughter and I confirmed that we [facility] and I asked her to have [facility] resend the fax.  I also resent the rx's again

## 2014-04-12 ENCOUNTER — Encounter (HOSPITAL_BASED_OUTPATIENT_CLINIC_OR_DEPARTMENT_OTHER): Payer: Self-pay | Admitting: Emergency Medicine

## 2014-04-12 ENCOUNTER — Emergency Department (HOSPITAL_BASED_OUTPATIENT_CLINIC_OR_DEPARTMENT_OTHER)
Admission: EM | Admit: 2014-04-12 | Discharge: 2014-04-13 | Disposition: A | Discharge status: Home / Self Care | Payer: Medicare HMO | Attending: Emergency Medicine | Admitting: Emergency Medicine

## 2014-04-12 DIAGNOSIS — G8929 Other chronic pain: Secondary | ICD-10-CM | POA: Insufficient documentation

## 2014-04-12 DIAGNOSIS — Z9861 Coronary angioplasty status: Secondary | ICD-10-CM | POA: Diagnosis not present

## 2014-04-12 DIAGNOSIS — I10 Essential (primary) hypertension: Secondary | ICD-10-CM | POA: Diagnosis not present

## 2014-04-12 DIAGNOSIS — I2510 Atherosclerotic heart disease of native coronary artery without angina pectoris: Secondary | ICD-10-CM | POA: Diagnosis not present

## 2014-04-12 DIAGNOSIS — Z87891 Personal history of nicotine dependence: Secondary | ICD-10-CM | POA: Diagnosis not present

## 2014-04-12 DIAGNOSIS — M79605 Pain in left leg: Secondary | ICD-10-CM

## 2014-04-12 DIAGNOSIS — M7989 Other specified soft tissue disorders: Secondary | ICD-10-CM | POA: Insufficient documentation

## 2014-04-12 DIAGNOSIS — M79604 Pain in right leg: Secondary | ICD-10-CM

## 2014-04-12 DIAGNOSIS — Z862 Personal history of diseases of the blood and blood-forming organs and certain disorders involving the immune mechanism: Secondary | ICD-10-CM | POA: Insufficient documentation

## 2014-04-12 DIAGNOSIS — Z9889 Other specified postprocedural states: Secondary | ICD-10-CM | POA: Diagnosis not present

## 2014-04-12 DIAGNOSIS — Z872 Personal history of diseases of the skin and subcutaneous tissue: Secondary | ICD-10-CM | POA: Diagnosis not present

## 2014-04-12 DIAGNOSIS — K219 Gastro-esophageal reflux disease without esophagitis: Secondary | ICD-10-CM | POA: Insufficient documentation

## 2014-04-12 DIAGNOSIS — E7800 Pure hypercholesterolemia, unspecified: Secondary | ICD-10-CM | POA: Insufficient documentation

## 2014-04-12 DIAGNOSIS — Z7982 Long term (current) use of aspirin: Secondary | ICD-10-CM | POA: Insufficient documentation

## 2014-04-12 DIAGNOSIS — M79609 Pain in unspecified limb: Secondary | ICD-10-CM | POA: Insufficient documentation

## 2014-04-12 DIAGNOSIS — Z79899 Other long term (current) drug therapy: Secondary | ICD-10-CM | POA: Insufficient documentation

## 2014-04-12 DIAGNOSIS — Z8701 Personal history of pneumonia (recurrent): Secondary | ICD-10-CM | POA: Insufficient documentation

## 2014-04-12 DIAGNOSIS — F0150 Vascular dementia without behavioral disturbance: Secondary | ICD-10-CM | POA: Insufficient documentation

## 2014-04-12 MED ORDER — MORPHINE SULFATE 4 MG/ML IJ SOLN
4.0000 mg | Freq: Once | INTRAMUSCULAR | Status: AC
  Administered 2014-04-12: 4 mg via INTRAMUSCULAR
  Filled 2014-04-12: qty 1

## 2014-04-12 NOTE — ED Notes (Signed)
Pt with bilateral leg swelling and redness for 2 weeks, worsening.  Has been to pcp and has appt with vascular in September but reports this is "not soon enough".

## 2014-04-12 NOTE — ED Provider Notes (Signed)
CSN: [id number]     Arrival date & time 04/12/14  2133 History  This chart was scribed for Julianne Rice, MD by Jeanell Sparrow, ED Scribe. This patient was seen in room MH06/MH06 and the patient's care was started at 11:25 PM.   Chief Complaint  Patient presents with  . Leg Swelling   HPI HPI Comments: [name] Elliott is a 80 y.o. male who presents to the Emergency Department complaining of bilateral lower extremity pain that started about 5 weeks ago. He reports that he saw his PCP and was told to see a vascular specialist but he could only get an appointment a month from now. He reports that a month is "not soon enough". He states that he has associated swelling. He denies any fever, chills, emesis, activity change, SOB, and chest pain. Denies any focal weakness or numbness.  PCP- Dr. Silvio Pate  Past Medical History  Diagnosis Date  . Constipation, chronic   . Sebaceous cyst   . Other malaise and fatigue   . Pain in joint, shoulder region     Has chronic shoulder pain  . Persistent disorder of initiating or maintaining sleep   . Thrombocytopenia, unspecified   . Vascular dementia without behavioral disturbance     with periods of amnesia  . Other B-complex deficiencies   . Hypothyroidism   . Pain in limb   . Esophageal reflux   . Unspecified essential hypertension   . Arthropathy, unspecified, site unspecified   . Pure hypercholesterolemia   . Orthostasis   . PVC's (premature ventricular contractions)   . S/P cardiac cath 08/08/11    mild to moderate CAD primarily in the LAD. None are obstructive and appear stable from prior cath in 2007; managed medically  . Arthritis   . CAD (coronary artery disease)     last cath in 2012. Managed medically-some blockages  . Headache(784.0)   . 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 MRI Nov '11 - tendonosis and partial tear. Left shoulder surgery Jan '12 for partial shoulder replacement. [name])   . Blood dyscrasia      thromboctopenia  . Pneumonia 1980's?; 2011  . History of stomach ulcers 11/2012  . Chronic lower back pain    Past Surgical History  Procedure Laterality Date  . Hand surgery Right     crush injury, right fifth digit contracture, limited  motion  . Lumbar spine surgery  2007    Dr Philip Aspen ([place])  . Knee surgery Left 1991    "did it twice in 1 wk" (02/17/2013)  . Cataract extraction Left 2009  . Shoulder open rotator cuff repair Left 8.30.2011  . US echocardiography  02-28-2010    Est EF 50-55%  . Cardiac catheterization  08/2011  . Shoulder hemi-arthroplasty    . Finger amputation Right     pinky finger  . Hardware removal  02/05/2012    Procedure: HARDWARE REMOVAL;  Surgeon: Johnny Bridge, MD;  Location: [facility];  Service: Orthopedics;  Laterality: Left;  . Reverse shoulder arthroplasty  02/05/2012    Procedure: REVERSE SHOULDER ARTHROPLASTY;  Surgeon: Johnny Bridge, MD;  Location: [facility];  Service: Orthopedics;  Laterality: Left;  . Back surgery    . Esophagogastroduodenoscopy N/A 11/19/2012    Procedure: ESOPHAGOGASTRODUODENOSCOPY (EGD);  Surgeon: Jerene Bears, MD;  Location: [facility];  Service: Gastroenterology;  Laterality: N/A;   Family History  Problem Relation Age of Onset  . Breast cancer Mother   . Cancer  Brother     throat  . Diabetes Daughter   . Colon cancer Neg Hx   . Kidney disease Father    History  Substance Use Topics  . Smoking status: Former Smoker    Types: Cigars    Quit date: 09/03/1978  . Smokeless tobacco: Current User    Types: Chew     Comment: 02/17/2013 "I aien't smoked a cigar in 20-30 yr"  . Alcohol Use: No    Review of Systems  Constitutional: Negative for fever, chills and fatigue.  Respiratory: Negative for cough, shortness of breath and wheezing.   Cardiovascular: Positive for leg swelling. Negative for chest pain and palpitations.  Gastrointestinal: Negative for nausea, vomiting, abdominal pain and diarrhea.   Musculoskeletal: Negative for back pain, myalgias, neck pain and neck stiffness.  Skin: Negative for rash and wound.  Neurological: Negative for dizziness, weakness, light-headedness, numbness and headaches.  All other systems reviewed and are negative.   Allergies  Crestor  Home Medications   Prior to Admission medications   Medication Sig Start Date End Date Taking? Authorizing Provider  ACCU-CHEK SOFTCLIX LANCETS lancets Use as instructed to test blood sugar once daily dx: 250.42 02/19/14   Venia Carbon, MD  Alcohol Swabs (B-D SINGLE USE SWABS REGULAR) PADS Use as instructed to test blood sugar once daily dx: 250.42 02/19/14   Venia Carbon, MD  aspirin EC 81 MG tablet Take 81 mg by mouth every morning.     Historical Provider, MD  Blood Glucose Calibration (ACCU-CHEK AVIVA) SOLN Use to calibrate blood glucose meter dx 250.42 02/19/14   Venia Carbon, MD  cholecalciferol (VITAMIN D) 1000 UNITS tablet Take 1,000 Units by mouth every morning.     Historical Provider, MD  furosemide (LASIX) 80 MG tablet Take 1 tablet (80 mg total) by mouth daily. 03/23/14   Venia Carbon, MD  gabapentin (NEURONTIN) 300 MG capsule Take 1 capsule (300 mg total) by mouth 4 (four) times daily. 03/31/14   Tonia Ghent, MD  GLUCOSAMINE PO Take 1 tablet by mouth every morning.     Historical Provider, MD  glucose blood (ACCU-CHEK AVIVA PLUS) test strip Use as instructed to test blood sugar once daily dx: 250.42 02/19/14   Venia Carbon, MD  isosorbide mononitrate (IMDUR) 60 MG 24 hr tablet Take 1 tablet (60 mg total) by mouth every morning. 11/05/13   Neena Rhymes, MD  lovastatin (MEVACOR) 20 MG tablet Held as of 03/31/14 03/31/14   Tonia Ghent, MD  metFORMIN (GLUCOPHAGE) 500 MG tablet Take 1 tablet (500 mg total) by mouth 2 (two) times daily with a meal. 11/16/13   Neena Rhymes, MD  omeprazole (PRILOSEC) 20 MG capsule Take 1 capsule (20 mg total) by mouth 2 (two) times daily before a meal.  04/06/14   Venia Carbon, MD  oxyCODONE-acetaminophen (PERCOCET/ROXICET) 5-325 MG per tablet Take 1 tablet by mouth 3 (three) times daily as needed for severe pain. 03/23/14   Venia Carbon, MD  potassium chloride SA (K-DUR,KLOR-CON) 20 MEQ tablet Take 1 tablet (20 mEq total) by mouth 2 (two) times daily. 04/06/14   Venia Carbon, MD   BP 122/75  Pulse 83  Temp(Src) 97.8 F (36.6 C) (Oral)  Resp 20  Ht 5\' 5"  (1.651 m)  Wt 153 lb (69.4 kg)  BMI 25.46 kg/m2  SpO2 98% Physical Exam  Nursing note and vitals reviewed. Constitutional: He is oriented to person, place, and time. He  appears well-developed and well-nourished. No distress.  HENT:  Head: Normocephalic and atraumatic.  Mouth/Throat: Oropharynx is clear and moist.  Eyes: EOM are normal. Pupils are equal, round, and reactive to light.  Neck: Normal range of motion. Neck supple.  Cardiovascular: Normal rate and regular rhythm.   Pulmonary/Chest: Effort normal and breath sounds normal. No respiratory distress. He has no wheezes. He has no rales. He exhibits no tenderness.  Abdominal: Soft. Bowel sounds are normal. He exhibits no distension and no mass. There is no tenderness. There is no rebound and no guarding.  Musculoskeletal: Normal range of motion. He exhibits edema. He exhibits no tenderness.  Bilateral 1+ pitting edema from the mid tibial area distally. Patient also has some mild erythema in bilateral lower extremities. There is no warmth. No definite calf tenderness. Patient's distal pulses are intact bilaterally.  Neurological: He is alert and oriented to person, place, and time.  5/5 motor in all extremities. Sensation is intact.  Skin: Skin is warm and dry. No rash noted. No erythema.  Psychiatric: He has a normal mood and affect. His behavior is normal.    ED Course  Procedures (including critical care time) DIAGNOSTIC STUDIES: Oxygen Saturation is 98% on RA, normal by my interpretation.    COORDINATION OF  CARE: 11:29 PM- Pt advised of plan for treatment and pt agrees.  Labs Review Labs Reviewed - No data to display  Imaging Review No results found.   EKG Interpretation None      MDM   Final diagnoses:  None    I personally performed the services described in this documentation, which was scribed in my presence. The recorded information has been reviewed and is accurate.  Exam is consistent with infectious cause for the patient's symptoms. Likely vascular insufficiency versus neuropathy. Unable to completely rule out DVT. The bleeding this is unlikely will schedule bilateral lower extremity Doppler. Patient been advised to followup with his primary doctor and with the vascular surgeon. He's been given extensive return precautions and is voiced understanding.    Julianne Rice, MD 04/13/14 [phone]

## 2014-04-13 ENCOUNTER — Other Ambulatory Visit (HOSPITAL_COMMUNITY): Payer: Self-pay

## 2014-04-13 ENCOUNTER — Ambulatory Visit (HOSPITAL_BASED_OUTPATIENT_CLINIC_OR_DEPARTMENT_OTHER): Payer: Medicare HMO

## 2014-04-13 ENCOUNTER — Ambulatory Visit (HOSPITAL_COMMUNITY): Payer: Medicare HMO | Attending: Cardiology | Admitting: Cardiology

## 2014-04-13 ENCOUNTER — Telehealth: Payer: Self-pay

## 2014-04-13 ENCOUNTER — Telehealth: Payer: Self-pay | Admitting: *Deleted

## 2014-04-13 ENCOUNTER — Telehealth: Payer: Self-pay | Admitting: Internal Medicine

## 2014-04-13 DIAGNOSIS — R609 Edema, unspecified: Secondary | ICD-10-CM

## 2014-04-13 DIAGNOSIS — M79606 Pain in leg, unspecified: Secondary | ICD-10-CM

## 2014-04-13 DIAGNOSIS — M79609 Pain in unspecified limb: Secondary | ICD-10-CM

## 2014-04-13 NOTE — Telephone Encounter (Signed)
Please see if you can change test for [facility] or [facility]

## 2014-04-13 NOTE — Telephone Encounter (Signed)
Appt made at [facility] today at 4pm, daughter notified.

## 2014-04-13 NOTE — Telephone Encounter (Signed)
Pt was scheduled to go for an ultrasound today at 1pm [address]  She said it was way too far for Wm to go so she had to cancel the appt.  [name] Elliott has already called today but she wants to know if this ultrasound can be done at [facility] or somewhere closer to [place].

## 2014-04-13 NOTE — Discharge Instructions (Signed)
Return in the morning as instructed for ultrasound of the lower extremities. Followup with the vascular specialist. Also followup with her primary Dr. Return immediately for chest pain, shortness of breath, fever or for any concerns.

## 2014-04-13 NOTE — Telephone Encounter (Signed)
[name] said pt was seen by Dr Silvio Pate and Dr Damita Dunnings and referred to vein and vascular doctor. That appt is not until 05/12/14. Pt was having a lot of pain and pt was taken to [facility] on [address] and [place] of both legs were ordered there to be done today at 1 PM. [name] said having transportation issues and wants to know if Dr Silvio Pate could order [place] of legs at either [facility] or [facility] later afternoon today. [name] request cb ASAP.

## 2014-04-13 NOTE — Telephone Encounter (Signed)
CALL REPORT, patient negative for DVT, advised pt to go home and we would call.

## 2014-04-13 NOTE — Progress Notes (Signed)
Bilateral lower venous duplex performed  

## 2014-04-13 NOTE — Telephone Encounter (Signed)
Pt appt made 04/13/14 at 4:30pm

## 2014-04-14 NOTE — Telephone Encounter (Signed)
Left message on daughter's VM with results, advised to call if any questions

## 2014-04-14 NOTE — Telephone Encounter (Signed)
Please let them know there was no blood clot. I still think he should keep the upcoming appt with the vascular surgeon

## 2014-04-15 ENCOUNTER — Telehealth: Payer: Self-pay | Admitting: *Deleted

## 2014-04-15 NOTE — Telephone Encounter (Signed)
Patient coming tomorrow @ 9:15

## 2014-04-15 NOTE — Telephone Encounter (Signed)
Daughter calling stating that pt is in so much pain in his legs that he thinks he's going to die, per daughter pt states no medication is working, pt can't stand long enough to shave or fix his breakfast. Daughter wanted to know if there is anything else that can be done? I verified that he's taking all his medication and she states she fixes his med box. Please advise

## 2014-04-15 NOTE — Telephone Encounter (Signed)
I need to see him and reevaluate and then we discuss options for pain See if you can get him in tomorrow

## 2014-04-16 ENCOUNTER — Ambulatory Visit (INDEPENDENT_AMBULATORY_CARE_PROVIDER_SITE_OTHER): Payer: Commercial Managed Care - HMO | Admitting: Internal Medicine

## 2014-04-16 ENCOUNTER — Encounter: Payer: Self-pay | Admitting: Internal Medicine

## 2014-04-16 VITALS — BP 118/70 | HR 77 | Temp 98.1°F | Wt 152.0 lb

## 2014-04-16 DIAGNOSIS — M79604 Pain in right leg: Secondary | ICD-10-CM | POA: Insufficient documentation

## 2014-04-16 DIAGNOSIS — M79605 Pain in left leg: Principal | ICD-10-CM

## 2014-04-16 DIAGNOSIS — M79609 Pain in unspecified limb: Secondary | ICD-10-CM

## 2014-04-16 MED ORDER — GABAPENTIN 300 MG PO CAPS: 600.0000 mg | ORAL_CAPSULE | Freq: Three times a day (TID) | ORAL | Status: DC

## 2014-04-16 NOTE — Progress Notes (Signed)
Subjective:    Patient ID: [name] Elliott, male    DOB: Jun 13, 1928, 80 y.o.   MRN: [id number]  HPI Here with granddaughter  Bilateral leg pain has really worsened He feels that they hurt all the time and worse "when the blood spurts into them" Really hurt bad in bed last night---this was new Has worsened with prolonged standing Feels best when he is in the recliner  Swelling not as bad No chest pain Still with DOE--- "gives out" with walking (legs also) Sleeps flat in bed--no PND  Has tried the oxycodone--not taking regularly due to constipation Might have helped initially --but doesn't seem to be working anymore Only using the gabapentin once a day  Current Outpatient Prescriptions on File Prior to Visit  Medication Sig Dispense Refill  . ACCU-CHEK SOFTCLIX LANCETS lancets Use as instructed to test blood sugar once daily dx: 250.42  200 each  1  . Alcohol Swabs (B-D SINGLE USE SWABS REGULAR) PADS Use as instructed to test blood sugar once daily dx: 250.42  200 each  2  . aspirin EC 81 MG tablet Take 81 mg by mouth every morning.       . Blood Glucose Calibration (ACCU-CHEK AVIVA) SOLN Use to calibrate blood glucose meter dx 250.42  1 each  0  . cholecalciferol (VITAMIN D) 1000 UNITS tablet Take 1,000 Units by mouth every morning.       . furosemide (LASIX) 80 MG tablet Take 1 tablet (80 mg total) by mouth daily.  90 tablet  3  . gabapentin (NEURONTIN) 300 MG capsule Take 1 capsule (300 mg total) by mouth 4 (four) times daily.      Marland Kitchen GLUCOSAMINE PO Take 1 tablet by mouth every morning.       Marland Kitchen glucose blood (ACCU-CHEK AVIVA PLUS) test strip Use as instructed to test blood sugar once daily dx: 250.42  200 each  2  . isosorbide mononitrate (IMDUR) 60 MG 24 hr tablet Take 1 tablet (60 mg total) by mouth every morning.  90 tablet  1  . lovastatin (MEVACOR) 20 MG tablet Held as of 03/31/14      . metFORMIN (GLUCOPHAGE) 500 MG tablet Take 1 tablet (500 mg total) by mouth 2 (two) times  daily with a meal.  60 tablet  5  . omeprazole (PRILOSEC) 20 MG capsule Take 1 capsule (20 mg total) by mouth 2 (two) times daily before a meal.  180 capsule  3  . oxyCODONE-acetaminophen (PERCOCET/ROXICET) 5-325 MG per tablet Take 1 tablet by mouth 3 (three) times daily as needed for severe pain.  40 tablet  0  . potassium chloride SA (K-DUR,KLOR-CON) 20 MEQ tablet Take 1 tablet (20 mEq total) by mouth 2 (two) times daily.  180 tablet  3  . [DISCONTINUED] Hyoscyamine Sulfate (HYOMAX-DT) 0.375 MG TBCR Take 1 tab bid for 5 days, then prn abd pain  30 each  1   No current facility-administered medications on file prior to visit.    Allergies  Allergen Reactions  . Crestor [Rosuvastatin Calcium] Other (See Comments)    Muscle pain/aches    Past Medical History  Diagnosis Date  . Constipation, chronic   . Sebaceous cyst   . Other malaise and fatigue   . Pain in joint, shoulder region     Has chronic shoulder pain  . Persistent disorder of initiating or maintaining sleep   . Thrombocytopenia, unspecified   . Vascular dementia without behavioral disturbance     with  periods of amnesia  . Other B-complex deficiencies   . Hypothyroidism   . Pain in limb   . Esophageal reflux   . Unspecified essential hypertension   . Arthropathy, unspecified, site unspecified   . Pure hypercholesterolemia   . Orthostasis   . PVC's (premature ventricular contractions)   . S/P cardiac cath 08/08/11    mild to moderate CAD primarily in the LAD. None are obstructive and appear stable from prior cath in 2007; managed medically  . Arthritis   . CAD (coronary artery disease)     last cath in 2012. Managed medically-some blockages  . Headache(784.0)   . 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 MRI Nov '11 - tendonosis and partial tear. Left shoulder surgery Jan '12 for partial shoulder replacement. [name])   . Blood dyscrasia     thromboctopenia  . Pneumonia 1980's?; 2011  .  History of stomach ulcers 11/2012  . Chronic lower back pain     Past Surgical History  Procedure Laterality Date  . Hand surgery Right     crush injury, right fifth digit contracture, limited  motion  . Lumbar spine surgery  2007    Dr Philip Aspen ([place])  . Knee surgery Left 1991    "did it twice in 1 wk" (02/17/2013)  . Cataract extraction Left 2009  . Shoulder open rotator cuff repair Left 8.30.2011  . US echocardiography  02-28-2010    Est EF 50-55%  . Cardiac catheterization  08/2011  . Shoulder hemi-arthroplasty    . Finger amputation Right     pinky finger  . Hardware removal  02/05/2012    Procedure: HARDWARE REMOVAL;  Surgeon: Johnny Bridge, MD;  Location: [facility];  Service: Orthopedics;  Laterality: Left;  . Reverse shoulder arthroplasty  02/05/2012    Procedure: REVERSE SHOULDER ARTHROPLASTY;  Surgeon: Johnny Bridge, MD;  Location: [facility];  Service: Orthopedics;  Laterality: Left;  . Back surgery    . Esophagogastroduodenoscopy N/A 11/19/2012    Procedure: ESOPHAGOGASTRODUODENOSCOPY (EGD);  Surgeon: Jerene Bears, MD;  Location: [facility];  Service: Gastroenterology;  Laterality: N/A;    Family History  Problem Relation Age of Onset  . Breast cancer Mother   . Cancer Brother     throat  . Diabetes Daughter   . Colon cancer Neg Hx   . Kidney disease Father     History   Social History  . Marital Status: Widowed    Spouse Name: N/A    Number of Children: 2  . Years of Education: N/A   Occupational History  . retired    Social History Main Topics  . Smoking status: Former Smoker    Types: Cigars    Quit date: 09/03/1978  . Smokeless tobacco: Current User    Types: Chew     Comment: 02/17/2013 "I aien't smoked a cigar in 20-30 yr"  . Alcohol Use: No  . Drug Use: No  . Sexual Activity: No   Other Topics Concern  . Not on file   Social History Narrative   2nd grade education. Married '58, '95. 2 dtr '59, '64, youngest daughter died septic kidney.      Lives alone, handles all ADLs      Has living will   Daughter [name] is health care POA   Would accept resuscitation attempts--- but no prolonged ventilation.   Not sure about tube feeds.  Review of Systems Appetite off due to pain--"I make myself eat" No back pain now     Objective:   Physical Exam  Cardiovascular: Intact distal pulses.   Normal pedal pulses  Musculoskeletal:  1+ tense calf edema on right, slight on left Without sig tenderness but is sensitive           Assessment & Plan:

## 2014-04-16 NOTE — Patient Instructions (Signed)
Please stop the oxycodone. Increase the gabapentin to 300mg  three times daily. Let me know next week if the pain isn't better---we can still increase this till the pain improves

## 2014-04-16 NOTE — Assessment & Plan Note (Addendum)
Mild edema which is actually better---doesn't seem to be the cause of pain Most likely from spinal stenosis-- no DVT and good pulses Will stop the oxycodone Increase the gabapentin to 300mg  tid---and titrate up from there  Later his daughter called Stated he has been on gabapentin 4 times a day I instructed her to increase to 2 capsules (600mg ) tid If not improving next week, will make referral to physiatrist for consideration of ESI--may need MRI first though (she thinks [place])

## 2014-04-16 NOTE — Addendum Note (Signed)
Addended by: Despina Hidden on: 04/16/2014 10:32 AM   Modules accepted: Orders

## 2014-04-16 NOTE — Progress Notes (Signed)
Pre visit review using our clinic review tool, if applicable. No additional management support is needed unless otherwise documented below in the visit note. 

## 2014-04-20 ENCOUNTER — Telehealth: Payer: Self-pay | Admitting: Radiology

## 2014-04-20 NOTE — Telephone Encounter (Signed)
Message copied by Ellamae Sia on Tue Apr 20, 2014  2:31 PM ------      Message from: Viviana Simpler I      Created: Tue Apr 20, 2014 12:58 PM      Regarding: RE: Lab orders for Friday, 8.21.15       I don't think he needed more labs      Was checked after his recent increase in the diuretic      ----- Message -----         From: Ellamae Sia         Sent: 04/20/2014  12:32 PM           To: Venia Carbon, MD      Subject: Lab orders for Friday, 8.21.15                           Lab orders, no f/u until Nov       ------

## 2014-04-20 NOTE — Telephone Encounter (Signed)
Spoke to patients daughter, canceled the lab appt

## 2014-04-21 ENCOUNTER — Ambulatory Visit (INDEPENDENT_AMBULATORY_CARE_PROVIDER_SITE_OTHER): Payer: Commercial Managed Care - HMO | Admitting: Cardiovascular Disease

## 2014-04-21 ENCOUNTER — Encounter: Payer: Self-pay | Admitting: Cardiovascular Disease

## 2014-04-21 VITALS — BP 110/78 | HR 82 | Ht 64.0 in | Wt 152.0 lb

## 2014-04-21 DIAGNOSIS — M79609 Pain in unspecified limb: Secondary | ICD-10-CM

## 2014-04-21 DIAGNOSIS — I2510 Atherosclerotic heart disease of native coronary artery without angina pectoris: Secondary | ICD-10-CM | POA: Diagnosis not present

## 2014-04-21 DIAGNOSIS — I10 Essential (primary) hypertension: Secondary | ICD-10-CM

## 2014-04-21 DIAGNOSIS — M79605 Pain in left leg: Secondary | ICD-10-CM

## 2014-04-21 DIAGNOSIS — M79604 Pain in right leg: Secondary | ICD-10-CM

## 2014-04-21 NOTE — Assessment & Plan Note (Signed)
His blood pressures well controlled. Continue same medications.

## 2014-04-21 NOTE — Progress Notes (Signed)
[name] Elliott Date of Birth  1928/07/18 [facility] [address]    [place], [place]  [address] [phone]  Fax  [phone]  Problem list: 1. Moderate coronary artery disease 2. Dementia 3. Peripheral vascular disease  History of Present Illness:  80 year old gentleman with a history of moderate diffuse coronary artery disease. We treated medically. The left anterior descending arteries/ 1st diagonal  stenosis has not was not suitable for PCI.  He complains of being sick in general is weak sensation. He also complains of some left arm pain.   When I saw him last month. He was having some episodes of chest pain. We were trying to decide whether or not these were due to angina. I asked him to take nitroglycerin when he had these episodes of pain. At times the nitroglycerin helps him and at other times the nitroglycerin did not do anything.  When he walks up a hill in his backyard he has significant shortness breath and this "sick feeling".   He has had a partial shoulder replacement for a shoulder fracture.  He has had chronic shoulder pain since that time.  He has chronic knee pain and is considering knee surgery. His office visit was for the purpose of preoperative evaluation.  Sept. 17, 2013- He has no cardiac complaints.  He denies any chest pain or dyspnea.  He still has some left shoulder stiffness from his surgery February 05, 2012. He has had some wheezing and was recently started on steroids and an antibiotic.  He also ws diagnosed with peripheral neurophy   He was prescribed gabapentin but his daughter did not fill the medication because it was too expensive.  She bought some over-the-counter vitamin B12 which seems to be helping a little bit.  November 26, 2012:  [name] Elliott was recently hospitalized last week for peptic ulcer  Disease.  He had vomiting of coffee-ground emesis. He also had blood in his stool.  The  Doctors discontinued his Gabriel Earing powders and  his Fosamax. He seems to be doing well from a cardiac standpoint.  No angina.  He is having trouble with leg pain.    Jan. 20, 2015:  No CP.  He has lots of fatigue especially if he tries to walk any distance.  April 21, 2014,    Current Outpatient Prescriptions on File Prior to Visit  Medication Sig Dispense Refill  . ACCU-CHEK SOFTCLIX LANCETS lancets Use as instructed to test blood sugar once daily dx: 250.42  200 each  1  . Alcohol Swabs (B-D SINGLE USE SWABS REGULAR) PADS Use as instructed to test blood sugar once daily dx: 250.42  200 each  2  . aspirin EC 81 MG tablet Take 81 mg by mouth every morning.       . Blood Glucose Calibration (ACCU-CHEK AVIVA) SOLN Use to calibrate blood glucose meter dx 250.42  1 each  0  . cholecalciferol (VITAMIN D) 1000 UNITS tablet Take 1,000 Units by mouth every morning.       . furosemide (LASIX) 80 MG tablet Take 1 tablet (80 mg total) by mouth daily.  90 tablet  3  . gabapentin (NEURONTIN) 300 MG capsule Take 2 capsules (600 mg total) by mouth 3 (three) times daily.  360 capsule  3  . GLUCOSAMINE PO Take 1 tablet by mouth every morning.       Marland Kitchen glucose blood (ACCU-CHEK AVIVA PLUS) test strip Use as instructed to test blood sugar once daily  dx: 250.42  200 each  2  . lovastatin (MEVACOR) 20 MG tablet Held as of 03/31/14      . metFORMIN (GLUCOPHAGE) 500 MG tablet Take 1 tablet (500 mg total) by mouth 2 (two) times daily with a meal.  60 tablet  5  . omeprazole (PRILOSEC) 20 MG capsule Take 1 capsule (20 mg total) by mouth 2 (two) times daily before a meal.  180 capsule  3  . potassium chloride SA (K-DUR,KLOR-CON) 20 MEQ tablet Take 1 tablet (20 mEq total) by mouth 2 (two) times daily.  180 tablet  3  . [DISCONTINUED] Hyoscyamine Sulfate (HYOMAX-DT) 0.375 MG TBCR Take 1 tab bid for 5 days, then prn abd pain  30 each  1   No current facility-administered medications on file prior to visit.    Allergies  Allergen Reactions  . Crestor  [Rosuvastatin Calcium] Other (See Comments)    Muscle pain/aches    Past Medical History  Diagnosis Date  . Constipation, chronic   . Sebaceous cyst   . Other malaise and fatigue   . Pain in joint, shoulder region     Has chronic shoulder pain  . Persistent disorder of initiating or maintaining sleep   . Thrombocytopenia, unspecified   . Vascular dementia without behavioral disturbance     with periods of amnesia  . Other B-complex deficiencies   . Hypothyroidism   . Pain in limb   . Esophageal reflux   . Unspecified essential hypertension   . Arthropathy, unspecified, site unspecified   . Pure hypercholesterolemia   . Orthostasis   . PVC's (premature ventricular contractions)   . S/P cardiac cath 08/08/11    mild to moderate CAD primarily in the LAD. None are obstructive and appear stable from prior cath in 2007; managed medically  . Arthritis   . CAD (coronary artery disease)     last cath in 2012. Managed medically-some blockages  . Headache(784.0)   . 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 MRI Nov '11 - tendonosis and partial tear. Left shoulder surgery Jan '12 for partial shoulder replacement. [name])   . Blood dyscrasia     thromboctopenia  . Pneumonia 1980's?; 2011  . History of stomach ulcers 11/2012  . Chronic lower back pain     Past Surgical History  Procedure Laterality Date  . Hand surgery Right     crush injury, right fifth digit contracture, limited  motion  . Lumbar spine surgery  2007    Dr  Aspen ([place])  . Knee surgery Left 1991    "did it twice in 1 wk" (02/17/2013)  . Cataract extraction Left 2009  . Shoulder open rotator cuff repair Left 8.30.2011  . US echocardiography  02-28-2010    Est EF 50-55%  . Cardiac catheterization  08/2011  . Shoulder hemi-arthroplasty    . Finger amputation Right     pinky finger  . Hardware removal  02/05/2012    Procedure: HARDWARE REMOVAL;  Surgeon: Johnny Bridge, MD;   Location: [facility];  Service: Orthopedics;  Laterality: Left;  . Reverse shoulder arthroplasty  02/05/2012    Procedure: REVERSE SHOULDER ARTHROPLASTY;  Surgeon: Johnny Bridge, MD;  Location: [facility];  Service: Orthopedics;  Laterality: Left;  . Back surgery    . Esophagogastroduodenoscopy N/A 11/19/2012    Procedure: ESOPHAGOGASTRODUODENOSCOPY (EGD);  Surgeon: Jerene Bears, MD;  Location: [facility];  Service: Gastroenterology;  Laterality: N/A;    History  Smoking status  .  Former Smoker  . Types: Cigars  . Quit date: 09/03/1978  Smokeless tobacco  . Current User  . Types: Chew    Comment: 02/17/2013 "I aien't smoked a cigar in 20-30 yr"    History  Alcohol Use No    Family History  Problem Relation Age of Onset  . Breast cancer Mother   . Cancer Brother     throat  . Diabetes Daughter   . Colon cancer Neg Hx   . Kidney disease Father     Reviw of Systems:  Reviewed in the HPI.  All other systems are negative.  Physical Exam: BP 110/78  Pulse 82  Ht 5\' 4"  (1.626 m)  Wt 152 lb (68.947 kg)  BMI 26.08 kg/m2 The patient is alert and oriented x 3.  The mood and affect are normal.   Skin: warm and dry.  Color is normal.    HEENT:   His neck is supple. His carotids are 2+. There is no JVD  Lungs: His lungs are clear.   Heart: Regular rate, S1-S2.  Occasional premature beats   Abdomen: His abdomen is soft gait is good bowel sounds. There is no hepatosplenomegaly.  Extremities:  He has no clubbing cyanosis or edema  Neuro:  His gait is a bit unsteady. He tends to shuffle his feet. His motor and sensory are basically intact.     ECG: Jan. 20, 2015:  NSR at 77, occasional PVCs.  TWI in the lateral leads.   No significant changes from previous tracing. Assessment / Plan:

## 2014-04-21 NOTE — Assessment & Plan Note (Signed)
He has significant leg pain which seems because of a leg edema. His legs feel pretty normal the morning but quickly swelled everyday. He's already on Lasix 80 mg a day. I've recommended that he keep his legs elevated through much of the day. Also recommended some compression hose. He'll be seeing one of the doctors at [facility]  in several weeks.

## 2014-04-21 NOTE — Assessment & Plan Note (Signed)
He's not having any episodes of angina. Continue same medications.

## 2014-04-21 NOTE — Patient Instructions (Signed)
Your physician recommends that you continue on your current medications as directed. Please refer to the Current Medication list given to you today.  Your physician wants you to follow-up in: 6 months with Dr. Nahser.  You will receive a reminder letter in the mail two months in advance. If you don't receive a letter, please call our office to schedule the follow-up appointment.  

## 2014-04-23 ENCOUNTER — Telehealth: Payer: Self-pay | Admitting: Internal Medicine

## 2014-04-23 ENCOUNTER — Other Ambulatory Visit: Payer: Medicare HMO

## 2014-04-23 DIAGNOSIS — M79604 Pain in right leg: Secondary | ICD-10-CM

## 2014-04-23 DIAGNOSIS — M79605 Pain in left leg: Principal | ICD-10-CM

## 2014-04-23 DIAGNOSIS — R609 Edema, unspecified: Secondary | ICD-10-CM

## 2014-04-23 NOTE — Telephone Encounter (Signed)
Pain management is not the right place for his ongoing leg pain. He should see a vascular surgeon---may need pump to reduce edema, or other evaluation

## 2014-04-23 NOTE — Telephone Encounter (Signed)
Left message on daughter's VM with results, advised her to return my call if she has any further questions

## 2014-04-23 NOTE — Telephone Encounter (Signed)
Per daughter, swelling is still there, legs are red with indention's and very painful. Per daughter pt states his back is not hurting and he will not get injections in his back. Daughter keeps asking about pain management would they help? She states the medications are not working and he needs a pain management physician. She also doesn't think the vein specialist will help if there isn't a blood clot, she will keep the appt but would like to discuss pain management appt.

## 2014-04-23 NOTE — Telephone Encounter (Signed)
Ms. [name] Elliott called in ref to the pt's leg swelling. She says she was supposed to call you and update you on pt's condition.  She says his legs are no better.  Wants you to please call her back as quickly as possible.

## 2014-04-30 MED ORDER — OXYCODONE-ACETAMINOPHEN 5-325 MG PO TABS: 1.0000 | ORAL_TABLET | Freq: Three times a day (TID) | ORAL | Status: DC | PRN

## 2014-04-30 NOTE — Addendum Note (Signed)
Addended by: Despina Hidden on: 04/30/2014 04:01 PM   Modules accepted: Orders

## 2014-04-30 NOTE — Addendum Note (Signed)
Addended by: Viviana Simpler I on: 04/30/2014 01:48 PM   Modules accepted: Orders

## 2014-04-30 NOTE — Telephone Encounter (Signed)
He needs to keep them up I wrote for the oxycodone but forgot to sign Have someone redo Rx for me so they can sign it  They have noted the redness ongoing but there is no infection. He needs to keep them up, use the pain meds and see the vascular surgeon

## 2014-04-30 NOTE — Telephone Encounter (Signed)
Spoke with daughter and she states she wanted a different pain medication but she will try the percocet again but pt probably won't take it because it makes him constipated.  Spoke with daughter and advised rx ready for pick-up and it will be at the front desk.

## 2014-04-30 NOTE — Telephone Encounter (Signed)
She had told me that the oxycodone wasn't helping Okay to retry this pending the vascular surgeon appt

## 2014-04-30 NOTE — Telephone Encounter (Signed)
Daughter still confused and upset stating no one is doing anything about pt's legs and they are swollen and red and the swelling is going up to his knees. I asked if he was elevating his legs and she stated he was and that it's hard for him to get the compression hose on and I asked if someone could help him and she states "someone would have to go down there everyday". Daughter states nothing is helping and why can somebody do something???

## 2014-04-30 NOTE — Telephone Encounter (Signed)
Ms Joneen Caraway pts daughter request clarification of what pt is to take for leg pain; pt is presently taking Gabapentin 300 mg taking 2 caps 3 x a day and is not helping leg pain. Oxycodone was d/c. Pts present pain level is 8 and swelling and redness is moving up leg; swelling and redness continues to worsen. Pt has appt with vascular surgeon on 05/07/14. Mrs Joneen Caraway request cb. [facility].

## 2014-05-06 ENCOUNTER — Other Ambulatory Visit: Payer: Self-pay | Admitting: *Deleted

## 2014-05-06 ENCOUNTER — Encounter: Payer: Self-pay | Admitting: Surgery

## 2014-05-06 DIAGNOSIS — R609 Edema, unspecified: Secondary | ICD-10-CM

## 2014-05-06 DIAGNOSIS — I83893 Varicose veins of bilateral lower extremities with other complications: Secondary | ICD-10-CM

## 2014-05-07 ENCOUNTER — Ambulatory Visit (HOSPITAL_COMMUNITY)
Admission: RE | Admit: 2014-05-07 | Discharge: 2014-05-07 | Disposition: A | Discharge status: Home / Self Care | Payer: Medicare HMO | Source: Ambulatory Visit | Attending: Surgery | Admitting: Surgery

## 2014-05-07 ENCOUNTER — Ambulatory Visit (INDEPENDENT_AMBULATORY_CARE_PROVIDER_SITE_OTHER): Payer: Commercial Managed Care - HMO | Admitting: Surgery

## 2014-05-07 ENCOUNTER — Encounter: Payer: Self-pay | Admitting: Surgery

## 2014-05-07 VITALS — BP 104/60 | HR 71 | Temp 97.6°F | Ht 64.0 in | Wt 151.0 lb

## 2014-05-07 DIAGNOSIS — I825Z9 Chronic embolism and thrombosis of unspecified deep veins of unspecified distal lower extremity: Secondary | ICD-10-CM | POA: Insufficient documentation

## 2014-05-07 DIAGNOSIS — I83893 Varicose veins of bilateral lower extremities with other complications: Secondary | ICD-10-CM | POA: Insufficient documentation

## 2014-05-07 DIAGNOSIS — R609 Edema, unspecified: Secondary | ICD-10-CM

## 2014-05-07 DIAGNOSIS — I82891 Chronic embolism and thrombosis of other specified veins: Secondary | ICD-10-CM | POA: Insufficient documentation

## 2014-05-07 NOTE — Progress Notes (Signed)
Patient name: [name] Elliott MRN: [id number] DOB: February 16, 1928 Sex: male   Referred by: Dr. Silvio Pate  Reason for referral:  Chief Complaint  Patient presents with  . New Evaluation    bilateral LE edema with vv's of feet      HISTORY OF PRESENT ILLNESS: This is an 80 year old gentleman who comes in today for evaluation of leg pain.  He states that he has significant pain and swelling in his legs which has been going on for several months and getting worse.  The right leg bothers him more so than the left.  He reports having some form of the vein procedure done about 10 years ago.  He complains that when he walks his legs get more swollen and more painful.  The patient suffers from hypertension which is been under relatively good control.  He has coronary artery disease.  He has known stenoses but is being managed medically currently and has not undergone PCI.  He suffers from diabetes.  His most recent hemoglobin A1c is 7.6.  He is medically managed with a statin for hypercholesterolemia.  His most recent cholesterol was 169 with an LDL of 91 and HDL of 62.  Past Medical History  Diagnosis Date  . Constipation, chronic   . Sebaceous cyst   . Other malaise and fatigue   . Pain in joint, shoulder region     Has chronic shoulder pain  . Persistent disorder of initiating or maintaining sleep   . Thrombocytopenia, unspecified   . Vascular dementia without behavioral disturbance     with periods of amnesia  . Other B-complex deficiencies   . Hypothyroidism   . Pain in limb   . Esophageal reflux   . Unspecified essential hypertension   . Arthropathy, unspecified, site unspecified   . Pure hypercholesterolemia   . Orthostasis   . PVC's (premature ventricular contractions)   . S/P cardiac cath 08/08/11    mild to moderate CAD primarily in the LAD. None are obstructive and appear stable from prior cath in 2007; managed medically  . Arthritis   . CAD (coronary artery disease)     last  cath in 2012. Managed medically-some blockages  . Headache(784.0)   . 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 MRI Nov '11 - tendonosis and partial tear. Left shoulder surgery Jan '12 for partial shoulder replacement. [name])   . Blood dyscrasia     thromboctopenia  . Pneumonia 1980's?; 2011  . History of stomach ulcers 11/2012  . Chronic lower back pain   . Atrial fibrillation   . Diabetes mellitus without complication   . DVT (deep venous thrombosis)     Past Surgical History  Procedure Laterality Date  . Hand surgery Right     crush injury, right fifth digit contracture, limited  motion  . Lumbar spine surgery  2007    Dr Philip Aspen ([place])  . Knee surgery Left 1991    "did it twice in 1 wk" (02/17/2013)  . Cataract extraction Left 2009  . Shoulder open rotator cuff repair Left 8.30.2011  . US echocardiography  02-28-2010    Est EF 50-55%  . Cardiac catheterization  08/2011  . Shoulder hemi-arthroplasty    . Finger amputation Right     pinky finger  . Hardware removal  02/05/2012    Procedure: HARDWARE REMOVAL;  Surgeon: Johnny Bridge, MD;  Location: [facility];  Service: Orthopedics;  Laterality: Left;  . Reverse shoulder arthroplasty  02/05/2012    Procedure: REVERSE SHOULDER ARTHROPLASTY;  Surgeon: Johnny Bridge, MD;  Location: [facility];  Service: Orthopedics;  Laterality: Left;  . Back surgery    . Esophagogastroduodenoscopy N/A 11/19/2012    Procedure: ESOPHAGOGASTRODUODENOSCOPY (EGD);  Surgeon: Jerene Bears, MD;  Location: [facility];  Service: Gastroenterology;  Laterality: N/A;    History   Social History  . Marital Status: Widowed    Spouse Name: N/A    Number of Children: 2  . Years of Education: N/A   Occupational History  . retired    Social History Main Topics  . Smoking status: Former Smoker    Types: Cigars    Quit date: 09/03/1978  . Smokeless tobacco: Current User    Types: Chew     Comment: 02/17/2013 "I aien't smoked  a cigar in 20-30 yr"  . Alcohol Use: No  . Drug Use: No  . Sexual Activity: No   Other Topics Concern  . Not on file   Social History Narrative   2nd grade education. Married '58, '95. 2 dtr '59, '64, youngest daughter died septic kidney.    Lives alone, handles all ADLs      Has living will   Daughter [name] is health care POA   Would accept resuscitation attempts--- but no prolonged ventilation.   Not sure about tube feeds.                Family History  Problem Relation Age of Onset  . Breast cancer Mother   . Cancer Mother   . Diabetes Mother   . Cancer Brother     throat  . Diabetes Daughter   . Colon cancer Neg Hx   . Kidney disease Father   . Diabetes Sister     Allergies as of 05/07/2014 - Review Complete 05/07/2014  Allergen Reaction Noted  . Crestor [rosuvastatin calcium] Other (See Comments) 05/14/2011    Current Outpatient Prescriptions on File Prior to Visit  Medication Sig Dispense Refill  . ACCU-CHEK SOFTCLIX LANCETS lancets Use as instructed to test blood sugar once daily dx: 250.42  200 each  1  . Alcohol Swabs (B-D SINGLE USE SWABS REGULAR) PADS Use as instructed to test blood sugar once daily dx: 250.42  200 each  2  . aspirin EC 81 MG tablet Take 81 mg by mouth every morning.       . Blood Glucose Calibration (ACCU-CHEK AVIVA) SOLN Use to calibrate blood glucose meter dx 250.42  1 each  0  . cholecalciferol (VITAMIN D) 1000 UNITS tablet Take 1,000 Units by mouth every morning.       . furosemide (LASIX) 80 MG tablet Take 1 tablet (80 mg total) by mouth daily.  90 tablet  3  . gabapentin (NEURONTIN) 300 MG capsule Take 2 capsules (600 mg total) by mouth 3 (three) times daily.  360 capsule  3  . GLUCOSAMINE PO Take 1 tablet by mouth every morning.       Marland Kitchen glucose blood (ACCU-CHEK AVIVA PLUS) test strip Use as instructed to test blood sugar once daily dx: 250.42  200 each  2  . lovastatin (MEVACOR) 20 MG tablet Held as of 03/31/14      . metFORMIN  (GLUCOPHAGE) 500 MG tablet Take 1 tablet (500 mg total) by mouth 2 (two) times daily with a meal.  60 tablet  5  . omeprazole (PRILOSEC) 20 MG capsule Take 1 capsule (20 mg total) by mouth 2 (two) times  daily before a meal.  180 capsule  3  . oxyCODONE-acetaminophen (PERCOCET/ROXICET) 5-325 MG per tablet Take 1 tablet by mouth 3 (three) times daily as needed for severe pain.  40 tablet  0  . potassium chloride SA (K-DUR,KLOR-CON) 20 MEQ tablet Take 1 tablet (20 mEq total) by mouth 2 (two) times daily.  180 tablet  3  . [DISCONTINUED] Hyoscyamine Sulfate (HYOMAX-DT) 0.375 MG TBCR Take 1 tab bid for 5 days, then prn abd pain  30 each  1   No current facility-administered medications on file prior to visit.     REVIEW OF SYSTEMS: Cardiovascular: No chest pain, positive for shortness of breath with exertion, pain in legs with walking, pain in feet when lying flat, leg swelling, varicose veins. Pulmonary: No productive cough, asthma or wheezing. Neurologic: No weakness, paresthesias, aphasia, or amaurosis. No dizziness. Hematologic: No bleeding problems or clotting disorders. Musculoskeletal: No joint pain or joint swelling. Gastrointestinal: No blood in stool or hematemesis Genitourinary: No dysuria or hematuria. Psychiatric:: No history of major depression. Integumentary: No rashes or ulcers. Constitutional: No fever or chills.  PHYSICAL EXAMINATION: General: The patient appears their stated age.  Vital signs are BP 104/60  Pulse 71  Temp(Src) 97.6 F (36.4 C) (Oral)  Ht 5\' 4"  (1.626 m)  Wt 151 lb (68.493 kg)  BMI 25.91 kg/m2  SpO2 100% HEENT:  No gross abnormalities Pulmonary: Respirations are non-labored Musculoskeletal: There are no major deformities.   Neurologic: No focal weakness or paresthesias are detected, Skin: There are no ulcer or rashes noted. Psychiatric: The patient has normal affect. Cardiovascular: There is a regular rate and rhythm without significant murmur  appreciated.  Significant bilateral edema with hyperpigmentation on both medial ankles.  He has palpable pulses  Diagnostic Studies: Venous insufficiency ultrasound shows significant reflux within the deep vein system and right great saphenous vein.  The saphenous vein measures 0.76 at the saphenofemoral junction.  It is also reflux and the short saphenous vein on the right with maximum diameter of 0.53 cm.  There is deep vein reflux on the left with a short segment reflux within the short saphenous vein.    Assessment:  Bilateral leg pain and swelling secondary to venous insufficiency Plan: The patient has venous insufficiency in both lower extremities which are contributing to significant pain and edema.  He has also developed hyperpigmentation.  Ultrasound today shows significant deep vein reflux as well as reflux within the right great saphenous vein.  I have elected to place the patient in 20-30 thigh-high compression stockings with plans for him to return in 3 months to monitor his progress and to discuss the possibility of proceeding with right great saphenous vein laser ablation.     Eldridge Abrahams, M.D. [facility] Office: [phone] Pager:  [phone]

## 2014-05-24 ENCOUNTER — Other Ambulatory Visit: Payer: Self-pay | Admitting: *Deleted

## 2014-05-24 MED ORDER — METFORMIN HCL 500 MG PO TABS: 500.0000 mg | ORAL_TABLET | Freq: Two times a day (BID) | ORAL | Status: DC

## 2014-05-26 ENCOUNTER — Other Ambulatory Visit: Payer: Self-pay

## 2014-05-26 MED ORDER — OXYCODONE-ACETAMINOPHEN 5-325 MG PO TABS: 1.0000 | ORAL_TABLET | Freq: Three times a day (TID) | ORAL | Status: DC | PRN

## 2014-05-26 NOTE — Telephone Encounter (Signed)
[name] Elliott pts daughter left v/m requesting rx oxycodone apap. Call when ready for pick up. [name] Elliott request to pick up today.

## 2014-05-26 NOTE — Telephone Encounter (Signed)
Spoke with daughter and advised results.  

## 2014-05-27 ENCOUNTER — Telehealth: Payer: Self-pay

## 2014-05-27 DIAGNOSIS — M25561 Pain in right knee: Secondary | ICD-10-CM

## 2014-05-27 DIAGNOSIS — M25562 Pain in left knee: Principal | ICD-10-CM

## 2014-05-27 NOTE — Telephone Encounter (Signed)
[name] Elliott pts daughter left v/m; had question about medicine; left v/m for darlene to cb.

## 2014-05-27 NOTE — Telephone Encounter (Signed)
[name] said Dr Silvio Pate had explained why he does not prescribe higher dosage of oxycodone apap for pt (pt taking gabapentin etc) but yesterday pt went fishing with his brother's son in law or his brother and was advised that needs to contact doctor to get stronger med for his leg and feet pain. [name] said pt tells her the 5 mg is not effective for his leg and feet pain and pain level is 9-10. [name] said she wishes other people would not influence pt about asking for higher dosage of med because [name] understands why Dr Silvio Pate does not give the higher dosage but her father told her to call and ask.Please advise.

## 2014-05-27 NOTE — Telephone Encounter (Signed)
If he went fishing, I am not surprised the pain is worse. I don't think we should raise the dose of medication ---the potential side effects may outweigh any benefit

## 2014-05-31 NOTE — Telephone Encounter (Signed)
Order placed for ortho 

## 2014-05-31 NOTE — Telephone Encounter (Signed)
Pt's daughter called and left message on vm re: a response from last weeks message, and to request a referral to ortho Dr Marchia Bond for leg pain.   I called daughter requesting she return call to office.  She called back, I informed her of Dr. Alla German previous response, and told her I would send the referral request to Dr Silvio Pate.

## 2014-06-09 ENCOUNTER — Telehealth: Payer: Self-pay | Admitting: Cardiovascular Disease

## 2014-06-09 NOTE — Telephone Encounter (Signed)
New problem   Pt is having swelling in his legs and think he may have fluid around his heart. Please call pt's daughter.

## 2014-06-09 NOTE — Telephone Encounter (Signed)
Spoke with patient's daughter, [name] Elliott, who states patient has been seen recently by Dr. Mardelle Matte for leg issues who advised that he could offer leg wraps through their wound clinic but that patient should check with cardiologist first to make certain patient doesn't have fluid around his heart.  She states patient saw a vein specialist 1 month ago and was told that his left leg is irreparable due to previous vein graft but that he can offer intervention for right leg after patient wears support stockings for 3 months.  Daughter reports patient gets "some SOB" with walking a moderate distance.  She states patient has not complained of chest pain.  Daughter reports patient eats frozen Banquet dinners and canned soup daily - states he has refused to change his diet but is currently using a 50/50 salt mixture that has less salt than regular iodized salt.  Daughter would like to know if Dr. Acie Fredrickson needs to check patient's heart for fluid.  I advised [name] that I will send message to Dr. Acie Fredrickson who is in the office today and will call her back later today with his advice.  [name] verbalized understanding and agreement.

## 2014-06-09 NOTE — Telephone Encounter (Signed)
Left message for patient's daughter to call me back ?

## 2014-06-09 NOTE — Telephone Encounter (Signed)
Spoke with [name], patient's daughter, and reviewed Dr. Elmarie Shiley advice with her.  [name] asked if it was okay to proceed with Dr. Luanna Cole treatment option and I advised that they should proceed if they feel comfortable doing so.  I advised her to call back if patient develops increased SOB and/or weight gain or other concerns.  [name] verbalized understanding and agreement.

## 2014-06-09 NOTE — Telephone Encounter (Signed)
His heart is fine.  He has some mild diastolic dysfunction with normal systolic function. The leg edema will be there as long as he eats a salty diet.

## 2014-06-15 ENCOUNTER — Telehealth: Payer: Self-pay | Admitting: *Deleted

## 2014-06-15 NOTE — Telephone Encounter (Signed)
Received a refill request from [facility] for Imdur 60 mg. Per 12/21/13 ov pt may have been able to d/c med. Was he supposed to d/c med, or is he to continue it.

## 2014-06-16 ENCOUNTER — Other Ambulatory Visit: Payer: Self-pay

## 2014-06-16 MED ORDER — ISOSORBIDE MONONITRATE ER 60 MG PO TB24: 60.0000 mg | ORAL_TABLET | Freq: Every morning | ORAL | Status: DC

## 2014-06-16 NOTE — Telephone Encounter (Signed)
It was not officially stopped--just considering Check with him or daughter--if he is still taking it, refill for a year

## 2014-06-16 NOTE — Telephone Encounter (Signed)
Mrs [name] Elliott called to ck on status of IMdur refill; pt is presently taking Imdur and has never stopped med. Mrs [name] Elliott advised refill done to [facility].

## 2014-06-21 ENCOUNTER — Other Ambulatory Visit: Payer: Self-pay

## 2014-06-21 MED ORDER — OXYCODONE-ACETAMINOPHEN 5-325 MG PO TABS: 1.0000 | ORAL_TABLET | Freq: Three times a day (TID) | ORAL | Status: DC | PRN

## 2014-06-21 NOTE — Telephone Encounter (Signed)
[name] called back and request med list at front desk with oxycodone rx and also wants copy of med list faxed to [facility]; pt has appt there today.spoke with Robin @ [facility] confirmed fax # [phone].med list faxed as requested.

## 2014-06-21 NOTE — Telephone Encounter (Signed)
[name] Elliott left v/m requesting rx oxycodone apap. Call when ready for pick. Pt is almost out of medication. Pt request copy of pts current med list. Copy of med list at front desk for pick up. Left v/m requesting [name] to cb.

## 2014-06-21 NOTE — Telephone Encounter (Signed)
Left message on machine that rx is ready for pick-up, and it will be at our front desk.  

## 2014-06-25 ENCOUNTER — Ambulatory Visit: Payer: Commercial Managed Care - HMO

## 2014-07-26 ENCOUNTER — Telehealth: Payer: Self-pay | Admitting: Internal Medicine

## 2014-07-26 ENCOUNTER — Other Ambulatory Visit: Payer: Self-pay

## 2014-07-26 NOTE — Telephone Encounter (Signed)
We can set up a follow up and let him know I want to see how his legs are I can try to review functional and memory problems at that time without mentioning family concerns

## 2014-07-26 NOTE — Telephone Encounter (Signed)
Pt's daughter called and is concerned pt's dementia is getting worse and feels he needs to be evaluated, but she doesn't want him to know she requested the evaluation for fear he won't come back to the dr again. Ms. Joneen Caraway requests a c/b. Thank you.

## 2014-07-26 NOTE — Telephone Encounter (Signed)
Pt already has appt tomorrow at 11am, daughter just wanted to let you know

## 2014-07-26 NOTE — Telephone Encounter (Signed)
[name] pts daughter left v/m requesting refill gabapentin to [facility] mail order; do not see DPR signed to talk with [name]; pt already has appt scheduled 07/27/14.Please advise.

## 2014-07-26 NOTE — Telephone Encounter (Signed)
Per daughter her daughter will be with patient

## 2014-07-27 ENCOUNTER — Encounter: Payer: Self-pay | Admitting: Internal Medicine

## 2014-07-27 ENCOUNTER — Ambulatory Visit (INDEPENDENT_AMBULATORY_CARE_PROVIDER_SITE_OTHER): Payer: Commercial Managed Care - HMO | Admitting: Internal Medicine

## 2014-07-27 VITALS — BP 112/70 | HR 80 | Temp 97.7°F | Wt 151.8 lb

## 2014-07-27 DIAGNOSIS — F0150 Vascular dementia without behavioral disturbance: Secondary | ICD-10-CM

## 2014-07-27 DIAGNOSIS — I519 Heart disease, unspecified: Secondary | ICD-10-CM

## 2014-07-27 DIAGNOSIS — I5189 Other ill-defined heart diseases: Secondary | ICD-10-CM

## 2014-07-27 DIAGNOSIS — N183 Chronic kidney disease, stage 3 unspecified: Secondary | ICD-10-CM

## 2014-07-27 DIAGNOSIS — N058 Unspecified nephritic syndrome with other morphologic changes: Secondary | ICD-10-CM

## 2014-07-27 DIAGNOSIS — I2510 Atherosclerotic heart disease of native coronary artery without angina pectoris: Secondary | ICD-10-CM

## 2014-07-27 DIAGNOSIS — E1129 Type 2 diabetes mellitus with other diabetic kidney complication: Secondary | ICD-10-CM

## 2014-07-27 DIAGNOSIS — I83893 Varicose veins of bilateral lower extremities with other complications: Secondary | ICD-10-CM

## 2014-07-27 LAB — RENAL FUNCTION PANEL
Albumin: 4.2 g/dL (ref 3.5–5.2)
BUN: 26 mg/dL — AB (ref 6–23)
CHLORIDE: 96 meq/L (ref 96–112)
CO2: 27 mEq/L (ref 19–32)
Calcium: 9.5 mg/dL (ref 8.4–10.5)
Creatinine, Ser: 1.3 mg/dL (ref 0.4–1.5)
GFR: 53.97 mL/min — AB (ref >60.00)
Glucose, Bld: 94 mg/dL (ref 70–99)
POTASSIUM: 4.3 meq/L (ref 3.5–5.1)
Phosphorus: 3.6 mg/dL (ref 2.3–4.6)
Sodium: 136 mEq/L (ref 135–145)

## 2014-07-27 LAB — HEMOGLOBIN A1C: HEMOGLOBIN A1C: 7.3 % — AB (ref 4.6–6.5)

## 2014-07-27 LAB — HM DIABETES FOOT EXAM

## 2014-07-27 MED ORDER — GABAPENTIN 300 MG PO CAPS: 600.0000 mg | ORAL_CAPSULE | Freq: Three times a day (TID) | ORAL | Status: DC

## 2014-07-27 NOTE — Assessment & Plan Note (Signed)
Recheck microal negative

## 2014-07-27 NOTE — Progress Notes (Signed)
Pre visit review using our clinic review tool, if applicable. No additional management support is needed unless otherwise documented below in the visit note. 

## 2014-07-27 NOTE — Telephone Encounter (Signed)
Approved:okay to send for a year 

## 2014-07-27 NOTE — Telephone Encounter (Signed)
Found POA under media for Amalia Greenhouse notified sent to [facility].

## 2014-07-27 NOTE — Assessment & Plan Note (Signed)
And pain Seeing vascular surgeon and may likely need procedure

## 2014-07-27 NOTE — Assessment & Plan Note (Signed)
Mild and doesn't seem to have progressed despite daughter's concerns On ASA, statin BP fine

## 2014-07-27 NOTE — Assessment & Plan Note (Signed)
Still seems well controlled Will check labs

## 2014-07-27 NOTE — Assessment & Plan Note (Signed)
Compensated No respiratory difficulty

## 2014-07-27 NOTE — Assessment & Plan Note (Signed)
Has been quiet On imdur still

## 2014-07-27 NOTE — Progress Notes (Signed)
Subjective:    Patient ID: [name] Elliott, male    DOB: 22-Dec-1927, 80 y.o.   MRN: [id number]  HPI Here with granddaughter  Ongoing leg pain Can't wear the support hose--has ordered other ones. They make his legs hurt worse Hasn't been taking the pain pills as much  Checks sugars every morning 150 this AM. Usually in 90's , occasionally lower No apparent low sugar reactions  Lives alone Still drives-- ?still safe (discussed with family) He shops, cooks, cleans. Family does laundry He doesn't note any cognitive problems  No chest pain No palpitations No dizziness or syncope No sig edema  Current Outpatient Prescriptions on File Prior to Visit  Medication Sig Dispense Refill  . ACCU-CHEK SOFTCLIX LANCETS lancets Use as instructed to test blood sugar once daily dx: 250.42 200 each 1  . Alcohol Swabs (B-D SINGLE USE SWABS REGULAR) PADS Use as instructed to test blood sugar once daily dx: 250.42 200 each 2  . aspirin EC 81 MG tablet Take 81 mg by mouth every morning.     . Blood Glucose Calibration (ACCU-CHEK AVIVA) SOLN Use to calibrate blood glucose meter dx 250.42 1 each 0  . cholecalciferol (VITAMIN D) 1000 UNITS tablet Take 1,000 Units by mouth every morning.     . furosemide (LASIX) 80 MG tablet Take 1 tablet (80 mg total) by mouth daily. 90 tablet 3  . gabapentin (NEURONTIN) 300 MG capsule Take 2 capsules (600 mg total) by mouth 3 (three) times daily. 360 capsule 5  . GLUCOSAMINE PO Take 1 tablet by mouth every morning.     Marland Kitchen glucose blood (ACCU-CHEK AVIVA PLUS) test strip Use as instructed to test blood sugar once daily dx: 250.42 200 each 2  . isosorbide mononitrate (IMDUR) 60 MG 24 hr tablet Take 1 tablet (60 mg total) by mouth every morning. 90 tablet 3  . lovastatin (MEVACOR) 20 MG tablet Held as of 03/31/14    . metFORMIN (GLUCOPHAGE) 500 MG tablet Take 1 tablet (500 mg total) by mouth 2 (two) times daily with a meal. 180 tablet 3  . omeprazole (PRILOSEC) 20 MG capsule  Take 1 capsule (20 mg total) by mouth 2 (two) times daily before a meal. 180 capsule 3  . oxyCODONE-acetaminophen (PERCOCET/ROXICET) 5-325 MG per tablet Take 1 tablet by mouth 3 (three) times daily as needed for severe pain. 40 tablet 0  . potassium chloride SA (K-DUR,KLOR-CON) 20 MEQ tablet Take 1 tablet (20 mEq total) by mouth 2 (two) times daily. 180 tablet 3  . [DISCONTINUED] Hyoscyamine Sulfate (HYOMAX-DT) 0.375 MG TBCR Take 1 tab bid for 5 days, then prn abd pain 30 each 1   No current facility-administered medications on file prior to visit.    Allergies  Allergen Reactions  . Crestor [Rosuvastatin Calcium] Other (See Comments)    Muscle pain/aches    Past Medical History  Diagnosis Date  . Constipation, chronic   . Sebaceous cyst   . Other malaise and fatigue   . Pain in joint, shoulder region     Has chronic shoulder pain  . Persistent disorder of initiating or maintaining sleep   . Thrombocytopenia, unspecified   . Vascular dementia without behavioral disturbance     with periods of amnesia  . Other B-complex deficiencies   . Hypothyroidism   . Pain in limb   . Esophageal reflux   . Unspecified essential hypertension   . Arthropathy, unspecified, site unspecified   . Pure hypercholesterolemia   . Orthostasis   .  PVC's (premature ventricular contractions)   . S/P cardiac cath 08/08/11    mild to moderate CAD primarily in the LAD. None are obstructive and appear stable from prior cath in 2007; managed medically  . Arthritis   . CAD (coronary artery disease)     last cath in 2012. Managed medically-some blockages  . Headache(784.0)   . 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 MRI Nov '11 - tendonosis and partial tear. Left shoulder surgery Jan '12 for partial shoulder replacement. [name])   . Blood dyscrasia     thromboctopenia  . Pneumonia 1980's?; 2011  . History of stomach ulcers 11/2012  . Chronic lower back pain   . Atrial  fibrillation   . Diabetes mellitus without complication   . DVT (deep venous thrombosis)     Past Surgical History  Procedure Laterality Date  . Hand surgery Right     crush injury, right fifth digit contracture, limited  motion  . Lumbar spine surgery  2007    Dr Philip Aspen ([place])  . Knee surgery Left 1991    "did it twice in 1 wk" (02/17/2013)  . Cataract extraction Left 2009  . Shoulder open rotator cuff repair Left 8.30.2011  . US echocardiography  02-28-2010    Est EF 50-55%  . Cardiac catheterization  08/2011  . Shoulder hemi-arthroplasty    . Finger amputation Right     pinky finger  . Hardware removal  02/05/2012    Procedure: HARDWARE REMOVAL;  Surgeon: Johnny Bridge, MD;  Location: [place];  Service: Orthopedics;  Laterality: Left;  . Reverse shoulder arthroplasty  02/05/2012    Procedure: REVERSE SHOULDER ARTHROPLASTY;  Surgeon: Johnny Bridge, MD;  Location: [place];  Service: Orthopedics;  Laterality: Left;  . Back surgery    . Esophagogastroduodenoscopy N/A 11/19/2012    Procedure: ESOPHAGOGASTRODUODENOSCOPY (EGD);  Surgeon: Jerene Bears, MD;  Location: [facility];  Service: Gastroenterology;  Laterality: N/A;    Family History  Problem Relation Age of Onset  . Breast cancer Mother   . Cancer Mother   . Diabetes Mother   . Cancer Brother     throat  . Diabetes Daughter   . Colon cancer Neg Hx   . Kidney disease Father   . Diabetes Sister     History   Social History  . Marital Status: Widowed    Spouse Name: N/A    Number of Children: 2  . Years of Education: N/A   Occupational History  . retired    Social History Main Topics  . Smoking status: Former Smoker    Types: Cigars    Quit date: 09/03/1978  . Smokeless tobacco: Current User    Types: Chew     Comment: 02/17/2013 "I aien't smoked a cigar in 20-30 yr"  . Alcohol Use: No  . Drug Use: No  . Sexual Activity: No   Other Topics Concern  . Not on file   Social History Narrative   2nd  grade education. Married '58, '95. 2 dtr '59, '64, youngest daughter died septic kidney.    Lives alone, handles all ADLs      Has living will   Daughter [name] is health care POA   Would accept resuscitation attempts--- but no prolonged ventilation.   Not sure about tube feeds.               Review of Systems Doesn't sleep great--gets leg pain that awakens him Appetite is  fine Weight stable Some recent pain in neck    Objective:   Physical Exam  Constitutional: He appears well-developed and well-nourished. No distress.  Neck: Normal range of motion. Neck supple. No thyromegaly present.  Cardiovascular: Normal rate, regular rhythm, normal heart sounds and intact distal pulses.  Exam reveals no gallop.   No murmur heard. Pulmonary/Chest: Effort normal and breath sounds normal. No respiratory distress. He has no wheezes. He has no rales.  Musculoskeletal:  Venous dilation in feet Slight edema  Lymphadenopathy:    He has no cervical adenopathy.  Skin: No rash noted.  No foot ulcers  Psychiatric: He has a normal mood and affect. His behavior is normal.          Assessment & Plan:

## 2014-07-28 ENCOUNTER — Encounter: Payer: Self-pay | Admitting: *Deleted

## 2014-08-01 ENCOUNTER — Inpatient Hospital Stay (HOSPITAL_COMMUNITY)
Admission: EM | Admit: 2014-08-01 | Discharge: 2014-08-03 | DRG: 069 | Disposition: A | Discharge status: Home Health | Payer: Medicare HMO | Attending: Internal Medicine | Admitting: Internal Medicine

## 2014-08-01 ENCOUNTER — Emergency Department (HOSPITAL_COMMUNITY): Payer: Medicare HMO

## 2014-08-01 ENCOUNTER — Encounter (HOSPITAL_COMMUNITY): Payer: Self-pay | Admitting: Vascular Surgery

## 2014-08-01 DIAGNOSIS — I2510 Atherosclerotic heart disease of native coronary artery without angina pectoris: Secondary | ICD-10-CM | POA: Diagnosis present

## 2014-08-01 DIAGNOSIS — Z86718 Personal history of other venous thrombosis and embolism: Secondary | ICD-10-CM | POA: Diagnosis not present

## 2014-08-01 DIAGNOSIS — I672 Cerebral atherosclerosis: Secondary | ICD-10-CM | POA: Diagnosis present

## 2014-08-01 DIAGNOSIS — E119 Type 2 diabetes mellitus without complications: Secondary | ICD-10-CM | POA: Diagnosis present

## 2014-08-01 DIAGNOSIS — R531 Weakness: Secondary | ICD-10-CM | POA: Diagnosis not present

## 2014-08-01 DIAGNOSIS — I10 Essential (primary) hypertension: Secondary | ICD-10-CM | POA: Diagnosis present

## 2014-08-01 DIAGNOSIS — G459 Transient cerebral ischemic attack, unspecified: Secondary | ICD-10-CM | POA: Diagnosis not present

## 2014-08-01 DIAGNOSIS — K219 Gastro-esophageal reflux disease without esophagitis: Secondary | ICD-10-CM | POA: Diagnosis present

## 2014-08-01 DIAGNOSIS — Z87891 Personal history of nicotine dependence: Secondary | ICD-10-CM | POA: Diagnosis not present

## 2014-08-01 DIAGNOSIS — Z89021 Acquired absence of right finger(s): Secondary | ICD-10-CM

## 2014-08-01 DIAGNOSIS — Z6835 Body mass index (BMI) 35.0-35.9, adult: Secondary | ICD-10-CM

## 2014-08-01 DIAGNOSIS — E785 Hyperlipidemia, unspecified: Secondary | ICD-10-CM | POA: Diagnosis present

## 2014-08-01 DIAGNOSIS — F0150 Vascular dementia without behavioral disturbance: Secondary | ICD-10-CM | POA: Diagnosis present

## 2014-08-01 DIAGNOSIS — E669 Obesity, unspecified: Secondary | ICD-10-CM | POA: Diagnosis present

## 2014-08-01 DIAGNOSIS — E7800 Pure hypercholesterolemia, unspecified: Secondary | ICD-10-CM | POA: Diagnosis present

## 2014-08-01 DIAGNOSIS — E1129 Type 2 diabetes mellitus with other diabetic kidney complication: Secondary | ICD-10-CM | POA: Diagnosis present

## 2014-08-01 DIAGNOSIS — N058 Unspecified nephritic syndrome with other morphologic changes: Secondary | ICD-10-CM

## 2014-08-01 DIAGNOSIS — I480 Paroxysmal atrial fibrillation: Secondary | ICD-10-CM | POA: Diagnosis present

## 2014-08-01 DIAGNOSIS — I639 Cerebral infarction, unspecified: Secondary | ICD-10-CM

## 2014-08-01 DIAGNOSIS — Z79899 Other long term (current) drug therapy: Secondary | ICD-10-CM

## 2014-08-01 DIAGNOSIS — G451 Carotid artery syndrome (hemispheric): Secondary | ICD-10-CM

## 2014-08-01 DIAGNOSIS — Z7982 Long term (current) use of aspirin: Secondary | ICD-10-CM

## 2014-08-01 LAB — DIFFERENTIAL
BASOS ABS: 0 10*3/uL (ref 0.0–0.1)
Basophils Relative: 1 % (ref 0–1)
EOS PCT: 6 % — AB (ref 0–5)
Eosinophils Absolute: 0.3 10*3/uL (ref 0.0–0.7)
Lymphocytes Relative: 41 % (ref 12–46)
Lymphs Abs: 2.2 10*3/uL (ref 0.7–4.0)
MONO ABS: 0.5 10*3/uL (ref 0.1–1.0)
Monocytes Relative: 10 % (ref 3–12)
NEUTROS ABS: 2.2 10*3/uL (ref 1.7–7.7)
Neutrophils Relative %: 42 % — ABNORMAL LOW (ref 43–77)

## 2014-08-01 LAB — COMPREHENSIVE METABOLIC PANEL
ALBUMIN: 3.7 g/dL (ref 3.5–5.2)
ALT: 10 U/L (ref 0–53)
AST: 18 U/L (ref 0–37)
Alkaline Phosphatase: 72 U/L (ref 39–117)
Anion gap: 12 (ref 5–15)
BUN: 22 mg/dL (ref 6–23)
CALCIUM: 9.6 mg/dL (ref 8.4–10.5)
CO2: 27 mEq/L (ref 19–32)
CREATININE: 1.39 mg/dL — AB (ref 0.50–1.35)
Chloride: 97 mEq/L (ref 96–112)
GFR calc Af Amer: 52 mL/min — ABNORMAL LOW (ref >90)
GFR calc non Af Amer: 45 mL/min — ABNORMAL LOW (ref >90)
Glucose, Bld: 117 mg/dL — ABNORMAL HIGH (ref 70–99)
Potassium: 4.3 mEq/L (ref 3.7–5.3)
Sodium: 136 mEq/L — ABNORMAL LOW (ref 137–147)
Total Bilirubin: 0.5 mg/dL (ref 0.3–1.2)
Total Protein: 7.2 g/dL (ref 6.0–8.3)

## 2014-08-01 LAB — I-STAT CHEM 8, ED
BUN: 24 mg/dL — ABNORMAL HIGH (ref 6–23)
CHLORIDE: 99 meq/L (ref 96–112)
Calcium, Ion: 1.17 mmol/L (ref 1.13–1.30)
Creatinine, Ser: 1.4 mg/dL — ABNORMAL HIGH (ref 0.50–1.35)
Glucose, Bld: 121 mg/dL — ABNORMAL HIGH (ref 70–99)
HEMATOCRIT: 42 % (ref 39.0–52.0)
Hemoglobin: 14.3 g/dL (ref 13.0–17.0)
POTASSIUM: 4.3 meq/L (ref 3.7–5.3)
Sodium: 137 mEq/L (ref 137–147)
TCO2: 27 mmol/L (ref 0–100)

## 2014-08-01 LAB — CBC
HEMATOCRIT: 37.9 % — AB (ref 39.0–52.0)
Hemoglobin: 12.6 g/dL — ABNORMAL LOW (ref 13.0–17.0)
MCH: 28.8 pg (ref 26.0–34.0)
MCHC: 33.2 g/dL (ref 30.0–36.0)
MCV: 86.5 fL (ref 78.0–100.0)
Platelets: 195 10*3/uL (ref 150–400)
RBC: 4.38 MIL/uL (ref 4.22–5.81)
RDW: 13.8 % (ref 11.5–15.5)
WBC: 5.3 10*3/uL (ref 4.0–10.5)

## 2014-08-01 LAB — CBG MONITORING, ED
Glucose-Capillary: 118 mg/dL — ABNORMAL HIGH (ref 70–99)
Glucose-Capillary: 129 mg/dL — ABNORMAL HIGH (ref 70–99)

## 2014-08-01 LAB — PROTIME-INR
INR: 1.02 (ref 0.00–1.49)
PROTHROMBIN TIME: 13.5 s (ref 11.6–15.2)

## 2014-08-01 LAB — I-STAT TROPONIN, ED: TROPONIN I, POC: 0 ng/mL (ref 0.00–0.08)

## 2014-08-01 LAB — APTT: APTT: 33 s (ref 24–37)

## 2014-08-01 MED ORDER — OXYCODONE-ACETAMINOPHEN 5-325 MG PO TABS
1.0000 | ORAL_TABLET | Freq: Three times a day (TID) | ORAL | Status: DC | PRN
  Administered 2014-08-02 – 2014-08-03 (×3): 1 via ORAL
  Filled 2014-08-01 (×3): qty 1

## 2014-08-01 MED ORDER — GABAPENTIN 300 MG PO CAPS
600.0000 mg | ORAL_CAPSULE | Freq: Three times a day (TID) | ORAL | Status: DC
  Administered 2014-08-02 – 2014-08-03 (×5): 600 mg via ORAL
  Filled 2014-08-01 (×5): qty 2

## 2014-08-01 MED ORDER — DICLOFENAC SODIUM 1 % TD GEL: 1.0000 "application " | Freq: Three times a day (TID) | TRANSDERMAL | Status: DC | PRN

## 2014-08-01 MED ORDER — HEPARIN SODIUM (PORCINE) 5000 UNIT/ML IJ SOLN
5000.0000 [IU] | Freq: Three times a day (TID) | INTRAMUSCULAR | Status: DC
  Administered 2014-08-02 – 2014-08-03 (×5): 5000 [IU] via SUBCUTANEOUS
  Filled 2014-08-01 (×5): qty 1

## 2014-08-01 MED ORDER — ASPIRIN EC 81 MG PO TBEC
81.0000 mg | DELAYED_RELEASE_TABLET | Freq: Every day | ORAL | Status: DC
  Administered 2014-08-02 – 2014-08-03 (×2): 81 mg via ORAL
  Filled 2014-08-01 (×2): qty 1

## 2014-08-01 MED ORDER — METFORMIN HCL 500 MG PO TABS
500.0000 mg | ORAL_TABLET | Freq: Two times a day (BID) | ORAL | Status: DC
  Administered 2014-08-02 – 2014-08-03 (×3): 500 mg via ORAL
  Filled 2014-08-01 (×3): qty 1

## 2014-08-01 MED ORDER — POTASSIUM CHLORIDE CRYS ER 20 MEQ PO TBCR
20.0000 meq | EXTENDED_RELEASE_TABLET | Freq: Two times a day (BID) | ORAL | Status: DC
  Administered 2014-08-02 – 2014-08-03 (×4): 20 meq via ORAL
  Filled 2014-08-01 (×4): qty 1

## 2014-08-01 MED ORDER — ISOSORBIDE MONONITRATE ER 60 MG PO TB24
60.0000 mg | ORAL_TABLET | Freq: Every morning | ORAL | Status: DC
  Administered 2014-08-02 – 2014-08-03 (×2): 60 mg via ORAL
  Filled 2014-08-01 (×2): qty 1

## 2014-08-01 MED ORDER — STROKE: EARLY STAGES OF RECOVERY BOOK
Freq: Once | Status: AC
  Administered 2014-08-02: 02:00:00
  Filled 2014-08-01: qty 1

## 2014-08-01 MED ORDER — PANTOPRAZOLE SODIUM 40 MG PO TBEC
40.0000 mg | DELAYED_RELEASE_TABLET | Freq: Every day | ORAL | Status: DC
  Administered 2014-08-02 – 2014-08-03 (×2): 40 mg via ORAL
  Filled 2014-08-01 (×2): qty 1

## 2014-08-01 MED ORDER — FUROSEMIDE 80 MG PO TABS
80.0000 mg | ORAL_TABLET | Freq: Every day | ORAL | Status: DC
  Administered 2014-08-02 – 2014-08-03 (×2): 80 mg via ORAL
  Filled 2014-08-01 (×2): qty 1

## 2014-08-01 NOTE — Progress Notes (Signed)
Called. for report on patient. Nurse will call me back to give report.

## 2014-08-01 NOTE — H&P (Signed)
[facility] History and Physical  Randell Teare NLG:[id number] DOB: 05-18-1928 DOA: 08/01/2014  Referring physician: EDP PCP: Viviana Simpler, MD   Chief Complaint: L sided weakness   HPI: [name] Elliott is a 80 y.o. male h/o A.Fib, presents to the ED with L sided weakness and inability to ambulate.  Symptoms onset at 1800 at church this evening, an EMT who was at the scene noted L facial droop as well as LUE LLE weakness.  EMS was called and patient brought in to the ED as a code stroke; however, the patients symptoms rapidly resolved and by the time he was evaluated in the ED he was an NIHSS score of 0.  Patient being admitted for stroke work up.  Patient has no complaints at this time other than posterior headache; however, he wasn't able to relate much history to me about the unilateral weakness that he was having earlier (suspect a degree of dementia).  Review of Systems: Systems reviewed.  As above, otherwise negative  Past Medical History  Diagnosis Date  . Constipation, chronic   . Sebaceous cyst   . Other malaise and fatigue   . Pain in joint, shoulder region     Has chronic shoulder pain  . Persistent disorder of initiating or maintaining sleep   . Thrombocytopenia, unspecified   . Vascular dementia without behavioral disturbance     with periods of amnesia  . Other B-complex deficiencies   . Hypothyroidism   . Pain in limb   . Esophageal reflux   . Unspecified essential hypertension   . Arthropathy, unspecified, site unspecified   . Pure hypercholesterolemia   . Orthostasis   . PVC's (premature ventricular contractions)   . S/P cardiac cath 08/08/11    mild to moderate CAD primarily in the LAD. None are obstructive and appear stable from prior cath in 2007; managed medically  . Arthritis   . CAD (coronary artery disease)     last cath in 2012. Managed medically-some blockages  . Headache(784.0)   . 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 MRI Nov '11 - tendonosis and partial tear. Left shoulder surgery Jan '12 for partial shoulder replacement. [name])   . Blood dyscrasia     thromboctopenia  . Pneumonia 1980's?; 2011  . History of stomach ulcers 11/2012  . Chronic lower back pain   . Atrial fibrillation   . Diabetes mellitus without complication   . DVT (deep venous thrombosis)    Past Surgical History  Procedure Laterality Date  . Hand surgery Right     crush injury, right fifth digit contracture, limited  motion  . Lumbar spine surgery  2007    Dr Philip Aspen ([place])  . Knee surgery Left 1991    "did it twice in 1 wk" (02/17/2013)  . Cataract extraction Left 2009  . Shoulder open rotator cuff repair Left 8.30.2011  . US echocardiography  02-28-2010    Est EF 50-55%  . Cardiac catheterization  08/2011  . Shoulder hemi-arthroplasty    . Finger amputation Right     pinky finger  . Hardware removal  02/05/2012    Procedure: HARDWARE REMOVAL;  Surgeon: Johnny Bridge, MD;  Location: [facility];  Service: Orthopedics;  Laterality: Left;  . Reverse shoulder arthroplasty  02/05/2012    Procedure: REVERSE SHOULDER ARTHROPLASTY;  Surgeon: Johnny Bridge, MD;  Location: [facility];  Service: Orthopedics;  Laterality: Left;  . Back surgery    . Esophagogastroduodenoscopy N/A 11/19/2012  Procedure: ESOPHAGOGASTRODUODENOSCOPY (EGD);  Surgeon: Jerene Bears, MD;  Location: [facility];  Service: Gastroenterology;  Laterality: N/A;   Social History:  reports that he quit smoking about 35 years ago. His smoking use included Cigars. His smokeless tobacco use includes Chew. He reports that he does not drink alcohol or use illicit drugs.  Allergies  Allergen Reactions  . Crestor [Rosuvastatin Calcium] Other (See Comments)    Muscle pain/aches    Family History  Problem Relation Age of Onset  . Breast cancer Mother   . Cancer Mother   . Diabetes Mother   . Cancer Brother     throat  . Diabetes Daughter   . Colon  cancer Neg Hx   . Kidney disease Father   . Diabetes Sister      Prior to Admission medications   Medication Sig Start Date End Date Taking? Authorizing Provider  aspirin EC 81 MG tablet Take 81 mg by mouth daily.    Yes Historical Provider, MD  diclofenac sodium (VOLTAREN) 1 % GEL Apply 1 application topically 3 (three) times daily as needed (knee pain).   Yes Historical Provider, MD  furosemide (LASIX) 40 MG tablet Take 80 mg by mouth daily.   Yes Historical Provider, MD  gabapentin (NEURONTIN) 300 MG capsule Take 2 capsules (600 mg total) by mouth 3 (three) times daily. 07/27/14  Yes Venia Carbon, MD  isosorbide mononitrate (IMDUR) 60 MG 24 hr tablet Take 1 tablet (60 mg total) by mouth every morning. 06/16/14  Yes Venia Carbon, MD  metFORMIN (GLUCOPHAGE) 500 MG tablet Take 1 tablet (500 mg total) by mouth 2 (two) times daily with a meal. 05/24/14  Yes Venia Carbon, MD  omeprazole (PRILOSEC) 20 MG capsule Take 1 capsule (20 mg total) by mouth 2 (two) times daily before a meal. 04/06/14  Yes Venia Carbon, MD  oxyCODONE-acetaminophen (PERCOCET/ROXICET) 5-325 MG per tablet Take 1 tablet by mouth 3 (three) times daily as needed for severe pain. 06/21/14  Yes Venia Carbon, MD  potassium chloride SA (K-DUR,KLOR-CON) 20 MEQ tablet Take 1 tablet (20 mEq total) by mouth 2 (two) times daily. 04/06/14  Yes Venia Carbon, MD  ACCU-CHEK [facility] LANCETS lancets Use as instructed to test blood sugar once daily dx: 250.42 02/19/14   Venia Carbon, MD  Alcohol Swabs (B-D SINGLE USE SWABS REGULAR) PADS Use as instructed to test blood sugar once daily dx: 250.42 02/19/14   Venia Carbon, MD  Blood Glucose Calibration (ACCU-CHEK AVIVA) SOLN Use to calibrate blood glucose meter dx 250.42 02/19/14   Venia Carbon, MD  glucose blood (ACCU-CHEK AVIVA PLUS) test strip Use as instructed to test blood sugar once daily dx: 250.42 02/19/14   Venia Carbon, MD  lovastatin (MEVACOR) 20 MG tablet  Held as of 03/31/14 Patient not taking: Reported on 08/01/2014 03/31/14   Tonia Ghent, MD   Physical Exam: Filed Vitals:   08/01/14 2230  BP: 126/63  Pulse: 66  Temp:   Resp: 18    BP 126/63 mmHg  Pulse 66  Temp(Src) 97.5 F (36.4 C) (Oral)  Resp 18  SpO2 97%  General Appearance:    Alert, oriented, no distress, appears stated age  Head:    Normocephalic, atraumatic  Eyes:    PERRL, EOMI, sclera non-icteric        Nose:   Nares without drainage or epistaxis. Mucosa, turbinates normal  Throat:   Moist mucous membranes. Oropharynx without erythema  or exudate.  Neck:   Supple. No carotid bruits.  No thyromegaly.  No lymphadenopathy.   Back:     No CVA tenderness, no spinal tenderness  Lungs:     Clear to auscultation bilaterally, without wheezes, rhonchi or rales  Chest wall:    No tenderness to palpitation  Heart:    Regular rate and rhythm without murmurs, gallops, rubs  Abdomen:     Soft, non-tender, nondistended, normal bowel sounds, no organomegaly  Genitalia:    deferred  Rectal:    deferred  Extremities:   No clubbing, cyanosis or edema.  Pulses:   2+ and symmetric all extremities  Skin:   Skin color, texture, turgor normal, no rashes or lesions  Lymph nodes:   Cervical, supraclavicular, and axillary nodes normal  Neurologic:   CNII-XII intact. Normal strength, sensation and reflexes      throughout    Labs on Admission:  Basic Metabolic Panel:  Recent Labs Lab 07/27/14 1312 08/01/14 1945 08/01/14 2004  NA 136 136* 137  K 4.3 4.3 4.3  CL 96 97 99  CO2 27 27  --   GLUCOSE 94 117* 121*  BUN 26* 22 24*  CREATININE 1.3 1.39* 1.40*  CALCIUM 9.5 9.6  --   PHOS 3.6  --   --    Liver Function Tests:  Recent Labs Lab 07/27/14 1312 08/01/14 1945  AST  --  18  ALT  --  10  ALKPHOS  --  72  BILITOT  --  0.5  PROT  --  7.2  ALBUMIN 4.2 3.7   No results for input(s): LIPASE, AMYLASE in the last 168 hours. No results for input(s): AMMONIA in the last  168 hours. CBC:  Recent Labs Lab 08/01/14 1945 08/01/14 2004  WBC 5.3  --   NEUTROABS 2.2  --   HGB 12.6* 14.3  HCT 37.9* 42.0  MCV 86.5  --   PLT 195  --    Cardiac Enzymes: No results for input(s): CKTOTAL, CKMB, CKMBINDEX, TROPONINI in the last 168 hours.  BNP (last 3 results)  Recent Labs  10/06/13 0957  PROBNP 109.0*   CBG: No results for input(s): GLUCAP in the last 168 hours.  Radiological Exams on Admission: Ct Head (brain) Wo Contrast  08/01/2014   CLINICAL DATA:  80 year old with left-sided facial droop, code stroke.  EXAM: CT HEAD WITHOUT CONTRAST  TECHNIQUE: Contiguous axial images were obtained from the base of the skull through the vertex without intravenous contrast.  COMPARISON:  12/03/2012 and prior CTs.  11/22/2010 and prior MRs  FINDINGS: An age indeterminate left thalamic lacunar infarct is identified and not definitely present on the prior study.  Atrophy and chronic small-vessel white matter ischemic changes are identified.  There is no evidence of hemorrhage, mass lesion or mass effect, hydrocephalus or midline shift.  No acute or suspicious bony abnormalities are identified.  IMPRESSION: Age indeterminate left thalamic lacunar infarct without hemorrhage, which appears new since 12/03/2012.  Atrophy and chronic small-vessel white matter ischemic changes.  Critical Value/emergent results were called by telephone at the time of interpretation on 08/01/2014 at 8:11 pm to Dr. Varney Biles , who verbally acknowledged these results.   Electronically Signed   By: Hassan Rowan M.D.   On: 08/01/2014 20:14    EKG: Independently reviewed.  Assessment/Plan Principal Problem:   TIA (transient ischemic attack) Active Problems:   HYPERCHOLESTEROLEMIA   Essential hypertension   Coronary atherosclerosis of native coronary artery  Type 2 diabetes mellitus, controlled, with renal complications   Vascular dementia without behavioral disturbance   1. TIA - risk factors  include A.Fib, HTN, DM2, also has a lacunar infarct on the CT head on his left side (would not have caused todays symptoms), that was not present in 2014. 1. Stroke pathway and work up ordered 2. Dr. Nicole Kindred spoke with EDP 3. Stroke team to decide on anticoagulation indication for history of A.Fib in setting of lacunar infarct of L side and probable TIA of R side today.  He did have bleeding gastric ulcer  2. Vascular dementia - patient appears to have been taken off of lovastatin for some reason, unclear on why this was done, lipid panel ordered, may wish to discuss with PCP and consider restarting statin. 3. DM2 - 1. Continue metformin 2. CBG checks AC/HS 3. A1c ordered as part of stroke work up. 4. HTN - continue home meds.   Code Status: Full Code  Family Communication: Spoke with daughter over phone about CT scan results Disposition Plan: Admit to inpatient   Time spent: 57 min  Loie Jahr M. [facility] Pager [phone]  If 7AM-7PM, please contact the day team taking care of the patient [email or web address] Password [facility] 08/01/2014, 11:32 PM

## 2014-08-01 NOTE — ED Notes (Signed)
C/o feet being cold

## 2014-08-01 NOTE — Progress Notes (Signed)
Code Stroke called on 80 yo male. LSN at 1800 per daughter at the beginning of church services tonight. At pm daughter noticed patient unable to walk, complained of feeling "drunk all over". Per daughter a friend who is an EMT at the scene noticed a mild facial droop to the left and left arm and leg weakness at the church. Pertinent history includes Afib, CAD, DVT, recently diagnosed DM of which pt takes oral medications, elevated cholesterol, controlled HTN per daughter. Pt also takes an 81 mg aspirin daily. Pt taken from triage to stat CT scan. CT scan of the head showed a small, age indeterminate left thalamic infarction, per Dr. Nicole Kindred. Upon my NIHSS assessment pt had generalized weakness but was able to keep legs and arms suspended during exam. NIHSS scored 0. Per Dr.Stewart to keep patient in TPA window until 2100 (3 hr window due to age). Pt to be admitted for r/o stroke, probable TIA.

## 2014-08-01 NOTE — Consult Note (Signed)
Referring Physician: Kathrynn Humble, A    Chief Complaint: Transient left-sided weakness.  HPI: [name] Elliott is an 80 y.o. male history of essential hypertension, hypercholesterolemia, diabetes mellitus, atrial fibrillation and coronary artery disease, brought to the emergency room following onset of left-sided weakness. He was noted to have facial droop as well as weakness of left extremities. Patient has been on aspirin daily. He has not been on anticoagulation. There is no previous history of stroke nor TIA. CT scan of the head showed a small, age indeterminate left thalamic infarction was otherwise unremarkable. NIH stroke score was 0 at the time of this evaluation, as deficits had resolved.  LSN: 6 PM on 08/01/2014 tPA Given: No: Deficits resolved mRankin:  Past Medical History  Diagnosis Date  . Constipation, chronic   . Sebaceous cyst   . Other malaise and fatigue   . Pain in joint, shoulder region     Has chronic shoulder pain  . Persistent disorder of initiating or maintaining sleep   . Thrombocytopenia, unspecified   . Vascular dementia without behavioral disturbance     with periods of amnesia  . Other B-complex deficiencies   . Hypothyroidism   . Pain in limb   . Esophageal reflux   . Unspecified essential hypertension   . Arthropathy, unspecified, site unspecified   . Pure hypercholesterolemia   . Orthostasis   . PVC's (premature ventricular contractions)   . S/P cardiac cath 08/08/11    mild to moderate CAD primarily in the LAD. None are obstructive and appear stable from prior cath in 2007; managed medically  . Arthritis   . CAD (coronary artery disease)     last cath in 2012. Managed medically-some blockages  . Headache(784.0)   . 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 MRI Nov '11 - tendonosis and partial tear. Left shoulder surgery Jan '12 for partial shoulder replacement. [name])   . Blood dyscrasia     thromboctopenia  . Pneumonia  1980's?; 2011  . History of stomach ulcers 11/2012  . Chronic lower back pain   . Atrial fibrillation   . Diabetes mellitus without complication   . DVT (deep venous thrombosis)     Family History  Problem Relation Age of Onset  . Breast cancer Mother   . Cancer Mother   . Diabetes Mother   . Cancer Brother     throat  . Diabetes Daughter   . Colon cancer Neg Hx   . Kidney disease Father   . Diabetes Sister      Medications: I have reviewed the patient's current medications.  ROS: History obtained from the patient and patient's daughter  General ROS: negative for - chills, fatigue, fever, night sweats, weight gain or weight loss Psychological ROS: History of mild vascular dementia Ophthalmic ROS: negative for - blurry vision, double vision, eye pain or loss of vision ENT ROS: negative for - epistaxis, nasal discharge, oral lesions, sore throat, tinnitus or vertigo Allergy and Immunology ROS: negative for - hives or itchy/watery eyes Hematological and Lymphatic ROS: negative for - bleeding problems, bruising or swollen lymph nodes Endocrine ROS: negative for - galactorrhea, hair pattern changes, polydipsia/polyuria or temperature intolerance Respiratory ROS: negative for - cough, hemoptysis, shortness of breath or wheezing Cardiovascular ROS: Pain and swelling in lower extremities secondary to venous insufficiency Gastrointestinal ROS: negative for - abdominal pain, diarrhea, hematemesis, nausea/vomiting or stool incontinence Genito-Urinary ROS: negative for - dysuria, hematuria, incontinence or urinary frequency/urgency Musculoskeletal ROS: negative for -  joint swelling or muscular weakness Neurological ROS: as noted in HPI Dermatological ROS: negative for rash and skin lesion changes  Physical Examination: Blood pressure 107/54, pulse 67, temperature 97.9 F (36.6 C), temperature source Oral, resp. rate 17, SpO2 98 %.  Appearance was that of an elderly man of medium  build who was alert and in no acute distress. HEENT unremarkable. Neck was supple with full range of motion. Chest was clear to auscultation. Heart rate and rhythm were normal. Heart sounds were normal.  Neurologic Examination: Mental Status: Alert, oriented, thought content appropriate.  Speech fluent without evidence of aphasia. Able to follow commands without difficulty. Cranial Nerves: II-Visual fields were normal. III/IV/VI-Pupils were equal and reacted. Extraocular movements were full and conjugate.    V/VII-no facial numbness and no facial weakness. VIII-moderately severe hearing impairment on the left. X-normal speecht. Motor: 5/5 bilaterally with normal tone and bulk Sensory: Normal throughout. Deep Tendon Reflexes: Trace only in upper extremities and absent in lower extremities. Plantars: Flexor bilaterally Cerebellar: Normal finger-to-nose testing, although movements were very slow. Carotid auscultation: Normal  Ct Head (brain) Wo Contrast  08/01/2014   CLINICAL DATA:  80 year old with left-sided facial droop, code stroke.  EXAM: CT HEAD WITHOUT CONTRAST  TECHNIQUE: Contiguous axial images were obtained from the base of the skull through the vertex without intravenous contrast.  COMPARISON:  12/03/2012 and prior CTs.  11/22/2010 and prior MRs  FINDINGS: An age indeterminate left thalamic lacunar infarct is identified and not definitely present on the prior study.  Atrophy and chronic small-vessel white matter ischemic changes are identified.  There is no evidence of hemorrhage, mass lesion or mass effect, hydrocephalus or midline shift.  No acute or suspicious bony abnormalities are identified.  IMPRESSION: Age indeterminate left thalamic lacunar infarct without hemorrhage, which appears new since 12/03/2012.  Atrophy and chronic small-vessel white matter ischemic changes.  Critical Value/emergent results were called by telephone at the time of interpretation on 08/01/2014 at 8:11  pm to Dr. Varney Biles , who verbally acknowledged these results.   Electronically Signed   By: Hassan Rowan M.D.   On: 08/01/2014 20:14    Assessment: 80 y.o. male with multiple risk factors for stroke presenting with probable right subcortical TIA. Small vessel ischemic left cerebral infarction cannot be ruled out, however.  Stroke Risk Factors - atrial fibrillation, diabetes mellitus, hyperlipidemia and hypertension  Plan: 1. HgbA1c, fasting lipid panel 2. MRI, MRA  of the brain without contrast 3. PT consult, OT consult, Speech consult 4. Echocardiogram 5. Carotid dopplers 6. Prophylactic therapy-Antiplatelet med: Aspirin  7. Risk factor modification 8. Telemetry monitoring   C.R. Nicole Kindred, MD [facility] [phone]  08/01/2014, 9:11 PM

## 2014-08-01 NOTE — ED Notes (Signed)
Daughter (POA) would like to be called and updated with results and when he gets a room. [name]: [phone]

## 2014-08-01 NOTE — ED Notes (Addendum)
Pt reports to the ED for eval of sudden onset of left sided facial droop and left sided weakness. LSN was 18:00 tonight. Family reports he was unable to stand after church. Pt NSR on montior. He is A&Ox4, resp e/u, and skin warm and dry. Pt reports he reports he has some posterior head "heaviness". Pt reports the pain radiates from his neck into the back of his head. BP 120/55. Denies any CP or SOB. Pt A&Ox4, resp e/u, and skin warm and dry.

## 2014-08-01 NOTE — ED Provider Notes (Signed)
CSN: [id number]     Arrival date & time 08/01/14  1942 History   First MD Initiated Contact with Patient 08/01/14 2012     Chief Complaint  Patient presents with  . Cerebrovascular Accident    An emergency department physician performed an initial assessment on this suspected stroke patient at 6. (Consider location/radiation/quality/duration/timing/severity/associated sxs/prior Treatment) Patient is a 80 y.o. male presenting with Acute Neurological Problem. The history is provided by the patient. No language interpreter was used.  Cerebrovascular Accident This is a new problem. The current episode started today. Associated symptoms include headaches, neck pain and weakness. Pertinent negatives include no chest pain, fever, myalgias, nausea or vomiting. Associated symptoms comments: Weakness on the left side with facial asymmetry that started earlier this evening, approximately 2 hours PTA. No prior history of stroke. He states he had a mild posterior headache involving the neck and occipital area prior to onset of symptoms. No nausea or vomiting. No recent illness, fever. No chest pain or SOB..    Past Medical History  Diagnosis Date  . Constipation, chronic   . Sebaceous cyst   . Other malaise and fatigue   . Pain in joint, shoulder region     Has chronic shoulder pain  . Persistent disorder of initiating or maintaining sleep   . Thrombocytopenia, unspecified   . Vascular dementia without behavioral disturbance     with periods of amnesia  . Other B-complex deficiencies   . Hypothyroidism   . Pain in limb   . Esophageal reflux   . Unspecified essential hypertension   . Arthropathy, unspecified, site unspecified   . Pure hypercholesterolemia   . Orthostasis   . PVC's (premature ventricular contractions)   . S/P cardiac cath 08/08/11    mild to moderate CAD primarily in the LAD. None are obstructive and appear stable from prior cath in 2007; managed medically  . Arthritis   .  CAD (coronary artery disease)     last cath in 2012. Managed medically-some blockages  . Headache(784.0)   . 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 MRI Nov '11 - tendonosis and partial tear. Left shoulder surgery Jan '12 for partial shoulder replacement. [name])   . Blood dyscrasia     thromboctopenia  . Pneumonia 1980's?; 2011  . History of stomach ulcers 11/2012  . Chronic lower back pain   . Atrial fibrillation   . Diabetes mellitus without complication   . DVT (deep venous thrombosis)    Past Surgical History  Procedure Laterality Date  . Hand surgery Right     crush injury, right fifth digit contracture, limited  motion  . Lumbar spine surgery  2007    Dr Philip Aspen ([place])  . Knee surgery Left 1991    "did it twice in 1 wk" (02/17/2013)  . Cataract extraction Left 2009  . Shoulder open rotator cuff repair Left 8.30.2011  . US echocardiography  02-28-2010    Est EF 50-55%  . Cardiac catheterization  08/2011  . Shoulder hemi-arthroplasty    . Finger amputation Right     pinky finger  . Hardware removal  02/05/2012    Procedure: HARDWARE REMOVAL;  Surgeon: Johnny Bridge, MD;  Location: [place];  Service: Orthopedics;  Laterality: Left;  . Reverse shoulder arthroplasty  02/05/2012    Procedure: REVERSE SHOULDER ARTHROPLASTY;  Surgeon: Johnny Bridge, MD;  Location: [place];  Service: Orthopedics;  Laterality: Left;  . Back surgery    . Esophagogastroduodenoscopy  N/A 11/19/2012    Procedure: ESOPHAGOGASTRODUODENOSCOPY (EGD);  Surgeon: Jerene Bears, MD;  Location: [place];  Service: Gastroenterology;  Laterality: N/A;   Family History  Problem Relation Age of Onset  . Breast cancer Mother   . Cancer Mother   . Diabetes Mother   . Cancer Brother     throat  . Diabetes Daughter   . Colon cancer Neg Hx   . Kidney disease Father   . Diabetes Sister    History  Substance Use Topics  . Smoking status: Former Smoker    Types: Cigars     Quit date: 09/03/1978  . Smokeless tobacco: Current User    Types: Chew     Comment: 02/17/2013 "I aien't smoked a cigar in 20-30 yr"  . Alcohol Use: No    Review of Systems  Constitutional: Negative for fever.  HENT: Negative for trouble swallowing.   Respiratory: Negative for shortness of breath.   Cardiovascular: Negative for chest pain.  Gastrointestinal: Negative for nausea and vomiting.  Musculoskeletal: Positive for neck pain. Negative for myalgias.  Skin: Negative for color change.  Neurological: Positive for facial asymmetry, weakness and headaches.  Psychiatric/Behavioral: Negative for confusion.  All other systems reviewed and are negative.     Allergies  Crestor  Home Medications   Prior to Admission medications   Medication Sig Start Date End Date Taking? Authorizing Provider  ACCU-CHEK SOFTCLIX LANCETS lancets Use as instructed to test blood sugar once daily dx: 250.42 02/19/14   Venia Carbon, MD  Alcohol Swabs (B-D SINGLE USE SWABS REGULAR) PADS Use as instructed to test blood sugar once daily dx: 250.42 02/19/14   Venia Carbon, MD  aspirin EC 81 MG tablet Take 81 mg by mouth every morning.     Historical Provider, MD  Blood Glucose Calibration (ACCU-CHEK AVIVA) SOLN Use to calibrate blood glucose meter dx 250.42 02/19/14   Venia Carbon, MD  cholecalciferol (VITAMIN D) 1000 UNITS tablet Take 1,000 Units by mouth every morning.     Historical Provider, MD  furosemide (LASIX) 80 MG tablet Take 1 tablet (80 mg total) by mouth daily. 03/23/14   Venia Carbon, MD  gabapentin (NEURONTIN) 300 MG capsule Take 2 capsules (600 mg total) by mouth 3 (three) times daily. 07/27/14   Venia Carbon, MD  GLUCOSAMINE PO Take 1 tablet by mouth every morning.     Historical Provider, MD  glucose blood (ACCU-CHEK AVIVA PLUS) test strip Use as instructed to test blood sugar once daily dx: 250.42 02/19/14   Venia Carbon, MD  isosorbide mononitrate (IMDUR) 60 MG 24 hr  tablet Take 1 tablet (60 mg total) by mouth every morning. 06/16/14   Venia Carbon, MD  lovastatin (MEVACOR) 20 MG tablet Held as of 03/31/14 03/31/14   Tonia Ghent, MD  metFORMIN (GLUCOPHAGE) 500 MG tablet Take 1 tablet (500 mg total) by mouth 2 (two) times daily with a meal. 05/24/14   Venia Carbon, MD  omeprazole (PRILOSEC) 20 MG capsule Take 1 capsule (20 mg total) by mouth 2 (two) times daily before a meal. 04/06/14   Venia Carbon, MD  oxyCODONE-acetaminophen (PERCOCET/ROXICET) 5-325 MG per tablet Take 1 tablet by mouth 3 (three) times daily as needed for severe pain. 06/21/14   Venia Carbon, MD  potassium chloride SA (K-DUR,KLOR-CON) 20 MEQ tablet Take 1 tablet (20 mEq total) by mouth 2 (two) times daily. 04/06/14   Venia Carbon, MD  BP 110/52 mmHg  Pulse 70  Temp(Src) 97.9 F (36.6 C) (Oral)  Resp 17  SpO2 99% Physical Exam  Constitutional: He appears well-developed and well-nourished.  Neck: Normal range of motion.  Cardiovascular: Normal rate.   Pulmonary/Chest: Effort normal.  Abdominal: He exhibits no distension.  Musculoskeletal: Normal range of motion.  Neurological:  Stroke team at bedside. Neurologic exam deferred to neurology.  Skin: Skin is warm and dry.  Psychiatric: He has a normal mood and affect.    ED Course  Procedures (including critical care time) Labs Review Labs Reviewed  CBC - Abnormal; Notable for the following:    Hemoglobin 12.6 (*)    HCT 37.9 (*)    All other components within normal limits  DIFFERENTIAL - Abnormal; Notable for the following:    Neutrophils Relative % 42 (*)    Eosinophils Relative 6 (*)    All other components within normal limits  COMPREHENSIVE METABOLIC PANEL - Abnormal; Notable for the following:    Sodium 136 (*)    Glucose, Bld 117 (*)    Creatinine, Ser 1.39 (*)    GFR calc non Af Amer 45 (*)    GFR calc Af Amer 52 (*)    All other components within normal limits  I-STAT CHEM 8, ED - Abnormal;  Notable for the following:    BUN 24 (*)    Creatinine, Ser 1.40 (*)    Glucose, Bld 121 (*)    All other components within normal limits  PROTIME-INR  APTT  CBG MONITORING, ED  I-STAT TROPOININ, ED    Imaging Review Ct Head (brain) Wo Contrast  08/01/2014   CLINICAL DATA:  80 year old with left-sided facial droop, code stroke.  EXAM: CT HEAD WITHOUT CONTRAST  TECHNIQUE: Contiguous axial images were obtained from the base of the skull through the vertex without intravenous contrast.  COMPARISON:  12/03/2012 and prior CTs.  11/22/2010 and prior MRs  FINDINGS: An age indeterminate left thalamic lacunar infarct is identified and not definitely present on the prior study.  Atrophy and chronic small-vessel white matter ischemic changes are identified.  There is no evidence of hemorrhage, mass lesion or mass effect, hydrocephalus or midline shift.  No acute or suspicious bony abnormalities are identified.  IMPRESSION: Age indeterminate left thalamic lacunar infarct without hemorrhage, which appears new since 12/03/2012.  Atrophy and chronic small-vessel white matter ischemic changes.  Critical Value/emergent results were called by telephone at the time of interpretation on 08/01/2014 at 8:11 pm to Dr. Varney Biles , who verbally acknowledged these results.   Electronically Signed   By: Hassan Rowan M.D.   On: 08/01/2014 20:14     EKG Interpretation None      MDM   Final diagnoses:  None    1. cva  Stroke team assuming care in the ED. Patient appears stable, NAD at this time.     Dewaine Oats, PA-C 08/01/14 2056  Varney Biles, MD 08/01/14 [phone]

## 2014-08-02 ENCOUNTER — Encounter (HOSPITAL_COMMUNITY): Payer: Self-pay | Admitting: *Deleted

## 2014-08-02 ENCOUNTER — Telehealth: Payer: Self-pay | Admitting: Cardiovascular Disease

## 2014-08-02 ENCOUNTER — Inpatient Hospital Stay (HOSPITAL_COMMUNITY): Payer: Medicare HMO

## 2014-08-02 ENCOUNTER — Telehealth: Payer: Self-pay | Admitting: Internal Medicine

## 2014-08-02 DIAGNOSIS — I359 Nonrheumatic aortic valve disorder, unspecified: Secondary | ICD-10-CM

## 2014-08-02 DIAGNOSIS — I639 Cerebral infarction, unspecified: Secondary | ICD-10-CM | POA: Insufficient documentation

## 2014-08-02 DIAGNOSIS — G458 Other transient cerebral ischemic attacks and related syndromes: Secondary | ICD-10-CM

## 2014-08-02 LAB — HEMOGLOBIN A1C
HEMOGLOBIN A1C: 6.4 % — AB (ref <5.7)
MEAN PLASMA GLUCOSE: 137 mg/dL — AB (ref <117)

## 2014-08-02 LAB — LIPID PANEL
CHOL/HDL RATIO: 2.6 ratio
CHOLESTEROL: 155 mg/dL (ref 0–200)
HDL: 59 mg/dL (ref >39)
LDL Cholesterol: 87 mg/dL (ref 0–99)
Triglycerides: 46 mg/dL (ref <150)
VLDL: 9 mg/dL (ref 0–40)

## 2014-08-02 NOTE — Telephone Encounter (Signed)
New Message       Pt's daughter calling stating pt is at [facility] for a possible stroke. Daughter just wants to make Dr. Acie Fredrickson aware.

## 2014-08-02 NOTE — Telephone Encounter (Signed)
[name] Elliott called for status of gabapentin; [name] Elliott cked on line and [employer] did not have Gabapentin refill received on 07/27/14. Spoke with [employer] at [phone] and she said received gabapentin refill 07/27/14 and was shipped this morning. [name] Elliott notified and voiced understanding.

## 2014-08-02 NOTE — Progress Notes (Signed)
PROGRESS NOTE  Oather Muilenburg TOI:[id number] DOB: 1928/04/27 DOA: 08/01/2014 PCP: Viviana Simpler, MD  HPI/Subjective: Patient is an 80 year old with a past medical history of Afib, CAD, HTN, DM2, vascular dementia, and one recent hospitalization for GI bleed. He presented to the ED by EMS after experiencing left sided weakness and inability to twalk while at church on 11/29. Symptoms quickly resolved in ED. NIHSS score of 0. Today he appears alert and states that his symptoms remain resolved.     Assessment/Plan: TIA: - Risk factors include AFIB, HTN, DM2 - CT negative for acute abnormalities  - NIHSS score of 0 - Per neuro: 2D Echo, Carotid ultrasound ordered,  A1c pending, fasting lipid panel within normal limits, placed on Tele. - MRI brain without acute abnormalities, MRA brain with out acute abnormalities.   - PT/OT/ Speech to evaluate  AFIB: - Case discussed with his cardiologist Dr Acie Fredrickson. Patient having a CHADS score of at least 3, having significant risk of CVA. MRI during this hospitalization did not reveal evidence of acute infarct. - It appears that he had been on anticoagulation until a year ago when he was treated for upper GI bleed secondary to peptic ulcer disease. Thus restarting anticoagulation poses a risk of developing life-threatening GI bleed. Dr Acie Fredrickson recommended continuing aspirin for now and readdressing the issue of anticoagulation in the outpatient setting after further speaking with his daughter.  HTN: - Chronic - Continue Lasix   DM2: - Chronic - Continue Metformin and monitor CBGs  Vascular Dementia:  - Stable  - PT/OT to evaluate and treat   CAD: - Currently stable - No chest pain, serial troponin negative  Recent GI bleed:  - Contributed to goodie powder use - Symptoms resolved     DVT Prophylaxis:  Heparin   Code Status: Full Family Communication: Alert and agreeable to plan of care Disposition Plan: Remains inpatient     Consultants:  Neuro   Procedures:  2D ECHO  Antibiotics:  None  Objective: Filed Vitals:   08/02/14 0000 08/02/14 0056 08/02/14 0300 08/02/14 0500  BP: 103/56 107/49 106/56 103/54  Pulse: 63 64 58 60  Temp:  97.9 F (36.6 C) 98 F (36.7 C) 97.8 F (36.6 C)  TempSrc:  Oral Oral Oral  Resp: 16 16 16 16   Height:  5\' 4"  (1.626 m)    Weight:  92.579 kg (204 lb 1.6 oz)    SpO2: 96% 96% 96% 96%   No intake or output data in the 24 hours ending 08/02/14 1124 Filed Weights   08/02/14 0056  Weight: 92.579 kg (204 lb 1.6 oz)    Exam: General: Patient is laying in bed. He appears elderly and slightly frail. NAD. He appears hard of hearing.  HEENT:  EOMI, Anicteic Sclera, MMM.  Neck: Supple, no JVD, no masses  Cardiovascular: RRR, S1 S2 auscultated, no rubs, murmurs or gallops.   Respiratory: Clear to auscultation bilaterally with equal chest rise  Abdomen: Soft, nontender, nondistended, + bowel sounds  Extremities: 1+pitting edema bilaterally.  Neuro: AAOx3, cranial nerves grossly intact. Strength 5/5 in upper and lower extremities except for the right arm which was 4/5 in strength.  Skin: Without rashes exudates or nodules.      Data Reviewed: Basic Metabolic Panel:  Recent Labs Lab 07/27/14 1312 08/01/14 1945 08/01/14 2004  NA 136 136* 137  K 4.3 4.3 4.3  CL 96 97 99  CO2 27 27  --   GLUCOSE 94 117* 121*  BUN 26* 22 24*  CREATININE 1.3 1.39* 1.40*  CALCIUM 9.5 9.6  --   PHOS 3.6  --   --    Liver Function Tests:  Recent Labs Lab 07/27/14 1312 08/01/14 1945  AST  --  18  ALT  --  10  ALKPHOS  --  72  BILITOT  --  0.5  PROT  --  7.2  ALBUMIN 4.2 3.7   CBC:  Recent Labs Lab 08/01/14 1945 08/01/14 2004  WBC 5.3  --   NEUTROABS 2.2  --   HGB 12.6* 14.3  HCT 37.9* 42.0  MCV 86.5  --   PLT 195  --     BNP (last 3 results)  Recent Labs  10/06/13 0957  PROBNP 109.0*   CBG:  Recent Labs Lab 08/01/14 2024 08/01/14 2332  GLUCAP  118* 129*    Studies: Ct Head (brain) Wo Contrast  08/01/2014   CLINICAL DATA:  80 year old with left-sided facial droop, code stroke.  EXAM: CT HEAD WITHOUT CONTRAST  TECHNIQUE: Contiguous axial images were obtained from the base of the skull through the vertex without intravenous contrast.  COMPARISON:  12/03/2012 and prior CTs.  11/22/2010 and prior MRs  FINDINGS: An age indeterminate left thalamic lacunar infarct is identified and not definitely present on the prior study.  Atrophy and chronic small-vessel white matter ischemic changes are identified.  There is no evidence of hemorrhage, mass lesion or mass effect, hydrocephalus or midline shift.  No acute or suspicious bony abnormalities are identified.  IMPRESSION: Age indeterminate left thalamic lacunar infarct without hemorrhage, which appears new since 12/03/2012.  Atrophy and chronic small-vessel white matter ischemic changes.  Critical Value/emergent results were called by telephone at the time of interpretation on 08/01/2014 at 8:11 pm to Dr. Varney Biles , who verbally acknowledged these results.   Electronically Signed   By: Hassan Rowan M.D.   On: 08/01/2014 20:14   Mr Brain Wo Contrast  08/02/2014   CLINICAL DATA:  TIA. Transient left extremity weakness and facial droop.  EXAM: MRI HEAD WITHOUT CONTRAST  MRA HEAD WITHOUT CONTRAST  TECHNIQUE: Multiplanar, multiecho pulse sequences of the brain and surrounding structures were obtained without intravenous contrast. Angiographic images of the head were obtained using MRA technique without contrast.  COMPARISON:  Head CT 08/01/2014 and MRI 11/22/2010  FINDINGS: MRI HEAD FINDINGS  There is no evidence of acute infarct, intracranial hemorrhage, mass, midline shift, or extra-axial fluid collection. There is moderate cerebral atrophy, stable to slightly progressed from prior MRI. Patchy and confluent T2 hyperintensities in the subcortical and deep cerebral white matter bilaterally have minimally  progressed from the prior MRI and are nonspecific but compatible with moderate chronic small vessel ischemic disease. Chronic lacunar infarcts are again seen in the thalami and basal ganglia bilaterally, including a chronic infarct in the left thalamus which is new from the prior MRI.  Prior bilateral cataract extraction is noted. There is mild bilateral ethmoid air cell mucosal thickening. Mastoid air cells are clear. Major intracranial vascular flow voids are preserved.  MRA HEAD FINDINGS  The visualized distal vertebral arteries are patent with the left being slightly dominant. PICA and SCA origins are patent. Basilar artery is patent without stenosis. PCAs are patent without proximal stenosis. There is mild bilateral PCA branch vessel irregularity suggestive of atherosclerosis. There may be a small, patent left posterior communicating artery.  Internal carotid arteries are patent from skullbase to carotid termini without stenosis. Anterior communicating artery is patent. ACAs  and MCAs are patent without proximal stenosis. There is mild bilateral MCA and ACA branch vessel irregularity. No intracranial aneurysm is identified.  IMPRESSION: 1. No acute intracranial abnormality. 2. Moderate chronic small vessel ischemic disease, chronic lacunar infarcts, and cerebral atrophy. 3. No evidence of major intracranial arterial occlusion or significant proximal stenosis. Branch vessel atherosclerotic irregularity.   Electronically Signed   By: Logan Bores   On: 08/02/2014 08:22   Mr Jodene Nam Head/brain Wo Cm  08/02/2014   CLINICAL DATA:  TIA. Transient left extremity weakness and facial droop.  EXAM: MRI HEAD WITHOUT CONTRAST  MRA HEAD WITHOUT CONTRAST  TECHNIQUE: Multiplanar, multiecho pulse sequences of the brain and surrounding structures were obtained without intravenous contrast. Angiographic images of the head were obtained using MRA technique without contrast.  COMPARISON:  Head CT 08/01/2014 and MRI 11/22/2010   FINDINGS: MRI HEAD FINDINGS  There is no evidence of acute infarct, intracranial hemorrhage, mass, midline shift, or extra-axial fluid collection. There is moderate cerebral atrophy, stable to slightly progressed from prior MRI. Patchy and confluent T2 hyperintensities in the subcortical and deep cerebral white matter bilaterally have minimally progressed from the prior MRI and are nonspecific but compatible with moderate chronic small vessel ischemic disease. Chronic lacunar infarcts are again seen in the thalami and basal ganglia bilaterally, including a chronic infarct in the left thalamus which is new from the prior MRI.  Prior bilateral cataract extraction is noted. There is mild bilateral ethmoid air cell mucosal thickening. Mastoid air cells are clear. Major intracranial vascular flow voids are preserved.  MRA HEAD FINDINGS  The visualized distal vertebral arteries are patent with the left being slightly dominant. PICA and SCA origins are patent. Basilar artery is patent without stenosis. PCAs are patent without proximal stenosis. There is mild bilateral PCA branch vessel irregularity suggestive of atherosclerosis. There may be a small, patent left posterior communicating artery.  Internal carotid arteries are patent from skullbase to carotid termini without stenosis. Anterior communicating artery is patent. ACAs and MCAs are patent without proximal stenosis. There is mild bilateral MCA and ACA branch vessel irregularity. No intracranial aneurysm is identified.  IMPRESSION: 1. No acute intracranial abnormality. 2. Moderate chronic small vessel ischemic disease, chronic lacunar infarcts, and cerebral atrophy. 3. No evidence of major intracranial arterial occlusion or significant proximal stenosis. Branch vessel atherosclerotic irregularity.   Electronically Signed   By: Logan Bores   On: 08/02/2014 08:22    Scheduled Meds: . aspirin EC  81 mg Oral Daily  . furosemide  80 mg Oral Daily  . gabapentin  600  mg Oral TID  . heparin  5,000 Units Subcutaneous 3 times per day  . isosorbide mononitrate  60 mg Oral q morning - 10a  . metFORMIN  500 mg Oral BID WC  . pantoprazole  40 mg Oral Daily  . potassium chloride SA  20 mEq Oral BID   Continuous Infusions:   Principal Problem:   TIA (transient ischemic attack) Active Problems:   HYPERCHOLESTEROLEMIA   Essential hypertension   Coronary atherosclerosis of native coronary artery   Type 2 diabetes mellitus, controlled, with renal complications   Vascular dementia without behavioral disturbance    Colbert Ewing PA-S [facility] Pager [phone]. If 7PM-7AM, please contact night-coverage at [email or web address], password [facility] 08/02/2014, 11:24 AM  LOS: 1 day    Addendum  I personally evaluated patient on 08/02/2014 and agree with the above findings. Patient is an 80 year old gentleman with a past medical history of paroxysmal  atrial fibrillation, coronary artery disease, hypertension, type 2 diabetes mellitus, history of GI bleed who was admitted to the medicine service on 08/01/2014. He presented with complaints of left-sided weakness as well as left-sided facial droop. Findings concerning for CVA he was admitted to telemetry with neurology consultation and placed on stroke protocol. On my evaluation this morning he reported improvement to left-sided weakness having 5 of 5 muscle strength. MRI of the brain did not show acute intracranial abnormality. I spoke with his cardiologist Dr Acie Fredrickson who informed me of the patient's history of atrial fibrillation. We discussed the risks and benefits of starting anticoagulation therapy at this point in time. Since MRI of brain was negative and having a history of upper GI bleed we decided to hold off on anticoagulation while in the hospital. This issue can be readdressed in the office with family members present. Will continue aspirin therapy.

## 2014-08-02 NOTE — Plan of Care (Signed)
Problem: Acute Treatment Outcomes Goal: 02 Sats > 94% Outcome: Completed/Met Date Met:  08/02/14

## 2014-08-02 NOTE — Progress Notes (Signed)
Patient admitted to 4N24 oriented to room and unit .

## 2014-08-02 NOTE — Plan of Care (Signed)
Problem: Consults Goal: Ischemic Stroke Patient Education See Patient Education Module for education specifics.  Outcome: Completed/Met Date Met:  08/02/14

## 2014-08-02 NOTE — Evaluation (Signed)
Physical Therapy Evaluation Patient Details Name: [name] Elliott MRN: [id number] DOB: 1928/04/26 Today's Date: 08/02/2014   History of Present Illness  80 y.o. male admitted to [facility] on 08/01/14 with left sided weakness and left facial droop.  Per ED notes symptoms seemed to resolve in the ED.  CT and MRI negative for acute stroke, but did reveal chronic lacunar infarcts.  Pt with significant PMHx of HTN, orthostasis, PVCs, CAD, chronic low back pain, A-fib, DM, DVT, lumbar spine surgery, l knee surgery, and L shoulder reverse TSA.   Clinical Impression  Pt seems to be ambulating close to his baseline level of function (mod I with cane both inside and outside of his home).  He did present a bit stiff initially with his gait, but the further we went the better he did.  He was able to ascend and descend the stairs with very little assist and will need to practice this with just his [facility] next session.  Since he is so close to his baseline and reports no history of falls, PT is not recommending any follow up therapy at this time.  PT will, however, follow acutely for deficits listed below.      Follow Up Recommendations No PT follow up    Equipment Recommendations  None recommended by PT    Recommendations for Other Services   NA    Precautions / Restrictions   NA     Mobility  Bed Mobility Overal bed mobility: Modified Independent             General bed mobility comments: extra time needed to get to EOB and heavy reliance on upper extremity support for transitions.   Transfers Overall transfer level: Needs assistance Equipment used: Straight cane Transfers: Sit to/from Stand Sit to Stand: Supervision         General transfer comment: supervision for safety due to slow transitions and heavy reliance on arms.   Ambulation/Gait Ambulation/Gait assistance: Min guard Ambulation Distance (Feet): 250 Feet Assistive device: Straight cane Gait Pattern/deviations: Step-through  pattern;Staggering right;Staggering left;Trendelenburg Gait velocity: decreased Gait velocity interpretation: Below normal speed for age/gender General Gait Details: Pt with left trendelenberg gait pattern likely due to h/o hip replacement on this side.  Pt's gait started significantly more stiff than it finished.  He seemed to losen up and get more steady on his feet as he went.    Stairs Stairs: Yes Stairs assistance: Supervision Stair Management: One rail Right;Forwards;With cane Number of Stairs: 5 General stair comments: Pt was able to demonstrate stairs with step to pattern and use of his cane.  he did use railing that was there.  In future sessions we should probably take away the rail and see if he can do the steps with just his cane.  Significantly more difficult to come down the stairs than go up likely due to eccentric quad weakness bil.       Modified Rankin (Stroke Patients Only) Modified Rankin (Stroke Patients Only) Pre-Morbid Rankin Score: No significant disability Modified Rankin: Moderately severe disability     Balance Overall balance assessment: Needs assistance Sitting-balance support: Feet supported;No upper extremity supported Sitting balance-Leahy Scale: Good     Standing balance support: No upper extremity supported;Single extremity supported Standing balance-Leahy Scale: Fair                               Pertinent Vitals/Pain Pain Assessment: Faces Faces Pain Scale: Hurts little  more Pain Location: right lower leg to the touch (has a red spot with tougher skin) Pain Descriptors / Indicators: Aching Pain Intervention(s): Limited activity within patient's tolerance;Monitored during session;Repositioned    Home Living Family/patient expects to be discharged to:: Private residence Living Arrangements: Alone Available Help at Discharge: Family;Available PRN/intermittently (daughter is there every Sunday) Type of Home: House Home Access:  Stairs to enter Entrance Stairs-Rails: None Entrance Stairs-Number of Steps: 2 Home Layout: One level Home Equipment: Cane - single point      Prior Function Level of Independence: Independent with assistive device(s)         Comments: Pt walks both inside and outside of his home with [facility].  He drives, cooks, goes to [employer] and cleans.      Extremity/Trunk Assessment   Upper Extremity Assessment: Defer to OT evaluation           Lower Extremity Assessment: Generalized weakness      Cervical / Trunk Assessment: Kyphotic  Communication   Communication: No difficulties  Cognition Arousal/Alertness: Awake/alert Behavior During Therapy: WFL for tasks assessed/performed Overall Cognitive Status: No family/caregiver present to determine baseline cognitive functioning       Memory: Decreased short-term memory (pt reports "I think" a lot during hx.  A&O x 4)                       Assessment/Plan    PT Assessment Patient needs continued PT services  PT Diagnosis Difficulty walking;Abnormality of gait;Generalized weakness;Acute pain   PT Problem List Decreased strength;Decreased activity tolerance;Decreased mobility;Decreased balance;Decreased knowledge of use of DME;Decreased knowledge of precautions;Pain  PT Treatment Interventions DME instruction;Gait training;Stair training;Functional mobility training;Therapeutic activities;Therapeutic exercise;Balance training;Neuromuscular re-education;Cognitive remediation;Patient/family education   PT Goals (Current goals can be found in the Care Plan section) Acute Rehab PT Goals Patient Stated Goal: to go home, back to his own bed PT Goal Formulation: With patient Time For Goal Achievement: 08/16/14 Potential to Achieve Goals: Good    Frequency Min 3X/week           End of Session Equipment Utilized During Treatment: Gait belt Activity Tolerance: Patient tolerated treatment well Patient left: in  bed;with call bell/phone within reach           Time: 2836-6294 PT Time Calculation (min) (ACUTE ONLY): 23 min  Charges:   PT Evaluation $Initial PT Evaluation Tier I: 1 Procedure PT Treatments $Gait Training: 8-22 mins        Jes Costales B. Kazia Grisanti, PT, DPT [phone]   08/02/2014, 4:44 PM

## 2014-08-02 NOTE — Progress Notes (Signed)
*  PRELIMINARY RESULTS* Vascular Ultrasound Carotid Duplex (Doppler) has been completed.   Findings suggest 1-39% internal carotid artery stenosis bilaterally. Vertebral arteries are patent with antegrade flow.  08/02/2014 11:03 AM Maudry Mayhew, RVT, RDCS, RDMS

## 2014-08-02 NOTE — Plan of Care (Signed)
Problem: Acute Treatment Outcomes Goal: Neuro exam at baseline or improved Outcome: Completed/Met Date Met:  08/02/14

## 2014-08-02 NOTE — Telephone Encounter (Signed)
I have talked to the Internist in the hospital. MRI was negative for CVA. He is at risk for CVA but also had a major GI bleed in the past. Will discuss restarting anticoagulaton when he comes to the office at his next visit.

## 2014-08-02 NOTE — Telephone Encounter (Signed)
Darelene called to let you know that [name] Elliott is at .  They think he had  Mini stroke.  They did ct last night.  They are doing MRI today.

## 2014-08-02 NOTE — Telephone Encounter (Signed)
Reviewed his chart Called his room --no answer Discussed status with daughter--will follow while in hospital

## 2014-08-02 NOTE — Progress Notes (Signed)
  Echocardiogram 2D Echocardiogram has been performed.  Sya Nestler FRANCES 08/02/2014, 11:11 AM

## 2014-08-02 NOTE — Progress Notes (Signed)
STROKE TEAM PROGRESS NOTE   HISTORY [name] Elliott is an 80 y.o. male history of essential hypertension, hypercholesterolemia, diabetes mellitus, atrial fibrillation and coronary artery disease, brought to the emergency room following onset of left-sided weakness. He was noted to have facial droop as well as weakness of left extremities. Patient has been on aspirin daily. He has not been on anticoagulation. There is no previous history of stroke nor TIA. CT scan of the head showed a small, age indeterminate left thalamic infarction was otherwise unremarkable. NIH stroke score was 0 at the time of this evaluation, as deficits had resolved. He was last seen normal 6 PM on 08/01/2014. Patient was not administered TPA secondary to deficits resolved. He was admitted for further evaluation and treatment.   SUBJECTIVE (INTERVAL HISTORY) Patient has no complaints and cannot tell me what happened   OBJECTIVE Temp:  [97.5 F (36.4 C)-98 F (36.7 C)] 97.8 F (36.6 C) (11/30 0500) Pulse Rate:  [58-75] 60 (11/30 0500) Cardiac Rhythm:  [-] Normal sinus rhythm (11/30 0730) Resp:  [12-18] 16 (11/30 0500) BP: (98-126)/(49-67) 103/54 mmHg (11/30 0500) SpO2:  [95 %-100 %] 96 % (11/30 0500) Weight:  [92.579 kg (204 lb 1.6 oz)] 92.579 kg (204 lb 1.6 oz) (11/30 0056)   Recent Labs Lab 08/01/14 2024 08/01/14 2332  GLUCAP 118* 129*    Recent Labs Lab 07/27/14 1312 08/01/14 1945 08/01/14 2004  NA 136 136* 137  K 4.3 4.3 4.3  CL 96 97 99  CO2 27 27  --   GLUCOSE 94 117* 121*  BUN 26* 22 24*  CREATININE 1.3 1.39* 1.40*  CALCIUM 9.5 9.6  --   PHOS 3.6  --   --     Recent Labs Lab 07/27/14 1312 08/01/14 1945  AST  --  18  ALT  --  10  ALKPHOS  --  72  BILITOT  --  0.5  PROT  --  7.2  ALBUMIN 4.2 3.7    Recent Labs Lab 08/01/14 1945 08/01/14 2004  WBC 5.3  --   NEUTROABS 2.2  --   HGB 12.6* 14.3  HCT 37.9* 42.0  MCV 86.5  --   PLT 195  --    No results for input(s): CKTOTAL,  CKMB, CKMBINDEX, TROPONINI in the last 168 hours.  Recent Labs  08/01/14 1945  LABPROT 13.5  INR 1.02   No results for input(s): COLORURINE, LABSPEC, PHURINE, GLUCOSEU, HGBUR, BILIRUBINUR, KETONESUR, PROTEINUR, UROBILINOGEN, NITRITE, LEUKOCYTESUR in the last 72 hours.  Invalid input(s): APPERANCEUR     Component Value Date/Time   CHOL 155 08/02/2014 0334   TRIG 46 08/02/2014 0334   HDL 59 08/02/2014 0334   CHOLHDL 2.6 08/02/2014 0334   VLDL 9 08/02/2014 0334   LDLCALC 87 08/02/2014 0334   Lab Results  Component Value Date   HGBA1C 7.3* 07/27/2014   No results found for: LABOPIA, COCAINSCRNUR, LABBENZ, AMPHETMU, THCU, LABBARB  No results for input(s): ETH in the last 168 hours.  Ct Head (brain) Wo Contrast  08/01/2014   CLINICAL DATA:  80 year old with left-sided facial droop, code stroke.  EXAM: CT HEAD WITHOUT CONTRAST  TECHNIQUE: Contiguous axial images were obtained from the base of the skull through the vertex without intravenous contrast.  COMPARISON:  12/03/2012 and prior CTs.  11/22/2010 and prior MRs  FINDINGS: An age indeterminate left thalamic lacunar infarct is identified and not definitely present on the prior study.  Atrophy and chronic small-vessel white matter ischemic changes are identified.  There is no evidence of hemorrhage, mass lesion or mass effect, hydrocephalus or midline shift.  No acute or suspicious bony abnormalities are identified.  IMPRESSION: Age indeterminate left thalamic lacunar infarct without hemorrhage, which appears new since 12/03/2012.  Atrophy and chronic small-vessel white matter ischemic changes.  Critical Value/emergent results were called by telephone at the time of interpretation on 08/01/2014 at 8:11 pm to Dr. Varney Biles , who verbally acknowledged these results.   Electronically Signed   By: Hassan Rowan M.D.   On: 08/01/2014 20:14   Mr Brain Wo Contrast  08/02/2014   CLINICAL DATA:  TIA. Transient left extremity weakness and facial  droop.  EXAM: MRI HEAD WITHOUT CONTRAST  MRA HEAD WITHOUT CONTRAST  TECHNIQUE: Multiplanar, multiecho pulse sequences of the brain and surrounding structures were obtained without intravenous contrast. Angiographic images of the head were obtained using MRA technique without contrast.  COMPARISON:  Head CT 08/01/2014 and MRI 11/22/2010  FINDINGS: MRI HEAD FINDINGS  There is no evidence of acute infarct, intracranial hemorrhage, mass, midline shift, or extra-axial fluid collection. There is moderate cerebral atrophy, stable to slightly progressed from prior MRI. Patchy and confluent T2 hyperintensities in the subcortical and deep cerebral white matter bilaterally have minimally progressed from the prior MRI and are nonspecific but compatible with moderate chronic small vessel ischemic disease. Chronic lacunar infarcts are again seen in the thalami and basal ganglia bilaterally, including a chronic infarct in the left thalamus which is new from the prior MRI.  Prior bilateral cataract extraction is noted. There is mild bilateral ethmoid air cell mucosal thickening. Mastoid air cells are clear. Major intracranial vascular flow voids are preserved.  MRA HEAD FINDINGS  The visualized distal vertebral arteries are patent with the left being slightly dominant. PICA and SCA origins are patent. Basilar artery is patent without stenosis. PCAs are patent without proximal stenosis. There is mild bilateral PCA branch vessel irregularity suggestive of atherosclerosis. There may be a small, patent left posterior communicating artery.  Internal carotid arteries are patent from skullbase to carotid termini without stenosis. Anterior communicating artery is patent. ACAs and MCAs are patent without proximal stenosis. There is mild bilateral MCA and ACA branch vessel irregularity. No intracranial aneurysm is identified.  IMPRESSION: 1. No acute intracranial abnormality. 2. Moderate chronic small vessel ischemic disease, chronic lacunar  infarcts, and cerebral atrophy. 3. No evidence of major intracranial arterial occlusion or significant proximal stenosis. Branch vessel atherosclerotic irregularity.   Electronically Signed   By: Logan Bores   On: 08/02/2014 08:22   Mr Jodene Nam Head/brain Wo Cm  08/02/2014   CLINICAL DATA:  TIA. Transient left extremity weakness and facial droop.  EXAM: MRI HEAD WITHOUT CONTRAST  MRA HEAD WITHOUT CONTRAST  TECHNIQUE: Multiplanar, multiecho pulse sequences of the brain and surrounding structures were obtained without intravenous contrast. Angiographic images of the head were obtained using MRA technique without contrast.  COMPARISON:  Head CT 08/01/2014 and MRI 11/22/2010  FINDINGS: MRI HEAD FINDINGS  There is no evidence of acute infarct, intracranial hemorrhage, mass, midline shift, or extra-axial fluid collection. There is moderate cerebral atrophy, stable to slightly progressed from prior MRI. Patchy and confluent T2 hyperintensities in the subcortical and deep cerebral white matter bilaterally have minimally progressed from the prior MRI and are nonspecific but compatible with moderate chronic small vessel ischemic disease. Chronic lacunar infarcts are again seen in the thalami and basal ganglia bilaterally, including a chronic infarct in the left thalamus which is new from the  prior MRI.  Prior bilateral cataract extraction is noted. There is mild bilateral ethmoid air cell mucosal thickening. Mastoid air cells are clear. Major intracranial vascular flow voids are preserved.  MRA HEAD FINDINGS  The visualized distal vertebral arteries are patent with the left being slightly dominant. PICA and SCA origins are patent. Basilar artery is patent without stenosis. PCAs are patent without proximal stenosis. There is mild bilateral PCA branch vessel irregularity suggestive of atherosclerosis. There may be a small, patent left posterior communicating artery.  Internal carotid arteries are patent from skullbase to  carotid termini without stenosis. Anterior communicating artery is patent. ACAs and MCAs are patent without proximal stenosis. There is mild bilateral MCA and ACA branch vessel irregularity. No intracranial aneurysm is identified.  IMPRESSION: 1. No acute intracranial abnormality. 2. Moderate chronic small vessel ischemic disease, chronic lacunar infarcts, and cerebral atrophy. 3. No evidence of major intracranial arterial occlusion or significant proximal stenosis. Branch vessel atherosclerotic irregularity.   Electronically Signed   By: Logan Bores   On: 08/02/2014 08:22   Carotid Doppler  There is 1-39% bilateral ICA stenosis. Vertebral artery flow is antegrade.    2D Echocardiogram  EF 45-50% with no source of embolus.   PHYSICAL EXAM Frail elderly Caucasian male not in distress.Awake alert. Afebrile. Head is nontraumatic. Neck is supple without bruit. Hearing is normal. Cardiac exam no murmur or gallop. Lungs are clear to auscultation. Distal pulses are well felt.  Neurological Exam : awake alert anxious male. Oriented 2. Diminished attention, registration and recall. Follows one and occasional two-step commands. Extraocular movements are full range without nystagmus. Fundi not visualized bilaterally. Fundi were not visualized. Face is symmetric without weakness. Motor system exam reveals no upper or lower extremity drift. No focal weakness. Sensation seems preserved bilaterally. Coordination slow but accurate. Gait not tested. ASSESSMENT/PLAN [name] Elliott is a 79 y.o. male with history of hypertension, hypercholesterolemia, diabetes mellitus, atrial fibrillation and coronary artery disease presenting with left-sided weakness. He did not receive IV t-PA due to deficits resolved.   TIA -nondominant brain  Resultant  Left hemiparesis resolved  MRI  No acute stroke  MRA  Unremarkable for large vessel stenosis  Carotid Doppler  No significant stenosis  2D Echo  No source of  embolus  Heparin 5000 units sq tid for VTE prophylaxis  Diet heart healthy/carb modified thin liquids  aspirin 81 mg orally every day prior to admission, now on aspirin 81 mg orally every day  Ongoing aggressive stroke risk factor management  Therapy recommendations:  No PT needs  Disposition:  Return home  Atrial Fibrillation  Home meds:  No anticoagulation for past 1 year when he was treated for upper GI bleed secondary to peptic ulcer disease  ChadsVasc score > 2, anticoagulation recommended  Dr. Acie Fredrickson follows as OP. Plans to discuss restarting anticoagulation follow-up office visit next week.  At this time, patient is not an anticoagulation candidate due to history of major GI bleed  Continue aspirin for now  Hypertension  Stable  Hyperlipidemia  Home meds:  mevacor 20 mg listed as not being taken since July 2015  LDL 87, goal < 70  Resume mevacor if patient agreeable (patient is allergic to Crestor)  Diabetes  HgbA1c 7.3 goal < 7.0  Other Stroke Risk Factors  Advanced age  Obesity, Body mass index is 35.02 kg/(m^2).   Coronary artery disease  Other Active Problems  Baseline vascular dementia  Hospital day # 1  BIBY,SHARON  08/02/2014 6:37  PM  I have personally examined this patient, reviewed notes, independently viewed imaging studies, participated in medical decision making and plan of care. I have made any additions or clarifications directly to the above note. Agree with note above.   Antony Contras, MD Medical Director [facility] Pager: [phone] 08/02/2014 7:41 PM    To contact Stroke Continuity provider, please refer to [email or web address] After hours, contact General Neurology

## 2014-08-03 DIAGNOSIS — I482 Chronic atrial fibrillation: Secondary | ICD-10-CM

## 2014-08-03 LAB — BASIC METABOLIC PANEL
Anion gap: 14 (ref 5–15)
BUN: 25 mg/dL — ABNORMAL HIGH (ref 6–23)
CO2: 27 meq/L (ref 19–32)
Calcium: 9.4 mg/dL (ref 8.4–10.5)
Chloride: 96 mEq/L (ref 96–112)
Creatinine, Ser: 1.43 mg/dL — ABNORMAL HIGH (ref 0.50–1.35)
GFR calc Af Amer: 50 mL/min — ABNORMAL LOW (ref >90)
GFR calc non Af Amer: 43 mL/min — ABNORMAL LOW (ref >90)
Glucose, Bld: 103 mg/dL — ABNORMAL HIGH (ref 70–99)
POTASSIUM: 4.4 meq/L (ref 3.7–5.3)
SODIUM: 137 meq/L (ref 137–147)

## 2014-08-03 LAB — CBC
HEMATOCRIT: 38.8 % — AB (ref 39.0–52.0)
Hemoglobin: 12.8 g/dL — ABNORMAL LOW (ref 13.0–17.0)
MCH: 28.4 pg (ref 26.0–34.0)
MCHC: 33 g/dL (ref 30.0–36.0)
MCV: 86.2 fL (ref 78.0–100.0)
Platelets: 205 10*3/uL (ref 150–400)
RBC: 4.5 MIL/uL (ref 4.22–5.81)
RDW: 13.8 % (ref 11.5–15.5)
WBC: 6.7 10*3/uL (ref 4.0–10.5)

## 2014-08-03 NOTE — Plan of Care (Signed)
Problem: Progression Outcomes Goal: Communication method established Outcome: Completed/Met Date Met:  08/03/14 Goal: If vent dependent, tolerates weaning Outcome: Not Applicable Date Met:  15/18/34 Goal: Rehab Team goals identified Outcome: Completed/Met Date Met:  08/03/14 Goal: Progressive activity as tolerated Outcome: Completed/Met Date Met:  08/03/14 Goal: Tolerating diet/TF at goal rate Outcome: Completed/Met Date Met:  08/03/14 Goal: Pain controlled Outcome: Completed/Met Date Met:  08/03/14 Goal: Bowel & Bladder Continence Outcome: Completed/Met Date Met:  08/03/14 Goal: Educational plan initiated Outcome: Completed/Met Date Met:  08/03/14

## 2014-08-03 NOTE — Discharge Summary (Signed)
Physician Discharge Summary  [name] Elliott UXN:[id number] DOB: 1927/12/18 DOA: 08/01/2014  PCP: Viviana Simpler, MD  Admit date: 08/01/2014 Discharge date: 08/03/2014  Time spent: 60 minutes  Recommendations for Outpatient Follow-up:  1. He was set up with home health services for home PT, RN and aide on day of discharge 2. Discharged on aspirin therapy, will consider anticoagulation in the outpatient setting with his cardiologist  Discharge Diagnoses:  TIA:  - Risk factors include AFIB, HTN, DM2 - CT negative for acute abnormalities  - NIHSS score of 0 - Neurology consulted and Pt was placed on Tele without significant events.  -  2D Echo showing 45-50% with hypokinesis and Grade 1 Diastolic Dysfunction - Carotid ultrasound: 1-39% internal carotid artery stenosis - A1c: 6.4, fasting lipid panel within normal limits - MRI brain without acute abnormalities, MRA brain without acute abnormalities.  - PT/OT/ Speech to evaluated. No Pt to follow up. Personally believe that patient requires some initial assistance. - Ordered home health services and spoke with daughter about having someone with patient while he transitions to living back home alone.   AFIB: - Case discussed with patient's cardiologist Dr Acie Fredrickson. -  Patient has CHADS score of at least 3: having significant risk of CVA.  - MRI during this hospitalization did not reveal evidence of acute infarct. - It appears that he had been on anticoagulation until a year ago when he was treated for upper GI bleed secondary to peptic ulcer disease. This was thought to be aggravated by excessive use of "goodie powders". Restarting anticoagulation poses a risk of developing life-threatening GI bleed. Spoke with Dr Acie Fredrickson who recommended continuing aspirin for now and readdressing the issue of anticoagulation in the outpatient setting after further speaking with his daughter. - Continue 81 mg ASA and have patient follow up with Dr. Acie Fredrickson in 2  weeks  HTN: - Chronic - Continue Lasix   DM2: - Chronic - Continue Metformin  Vascular Dementia:  - Stable  - No follow up PT needed according to PT evaluation   CAD: - Currently stable - No chest pain, serial troponins negative  Recent GI bleed:  - Contributed to goodie powder use - Symptoms resolved   Discharge Condition: Stable  Diet recommendation: Heart Healthy  Filed Weights   08/02/14 0056  Weight: 92.579 kg (204 lb 1.6 oz)    History of present illness:  [name] Elliott is an 80 year old with a past medical history significant for Afib, CAD, HTN, DM2, vascular dementia, and one recent hospitalization for GI bleed. He presented to the ED by EMS after experiencing left sided weakness and inability to walk while at church on 11/29. Symptoms quickly resolved in ED. NIHSS score of 0. CT of head showed an age indeterminate left thalamic lacunar infarct without hemorrhage but with no acute abnormalities identified.   Hospital Course:  Patient was admitted on CVA protocol, Neurology was consulted, and the patient was placed on Telemetry. MRA brain  And MR brain showed no acute abnormalities. Carotid doppler showed 1-39% internal carotid artery stenosis. ECHO demonstrated an EF of 45-50% with hypokinesis and Grade 1 Diastolic Dysfunction. Lasix was continued inpatient. Pt evaluated the patient and recommended no PT follow up. The patient continued to improve with no further symptoms and requested discharged.     Procedures:  None  Consultations:  Neurology  Dr. Acie Fredrickson   Discharge Exam: Filed Vitals:   08/03/14 1043  BP: 124/58  Pulse: 63  Temp: 98.9 F (37.2 C)  Resp: 18   Exam: General: Patient is in bed eating breakfast. He was previously ambulating in the hall with a walker. He appears elderly and slightly frail. NAD. He appears hard of hearing.  HEENT: EOMI, Anicteic Sclera, MMM.  Neck: Supple, no JVD, no masses  Cardiovascular: RRR, S1 S2  auscultated, no rubs, murmurs or gallops.  Respiratory: Clear to auscultation bilaterally with equal chest rise  Abdomen: Soft, nontender, nondistended, + bowel sounds  Extremities: Trace pitting edema bilaterally. Legs less erythematic than yesterday.  Neuro: AAOx3, cranial nerves grossly intact. Strength 5/5 in upper and lower extremities except for the right arm which was 4/5 in strength.  Skin: Without rashes exudates or nodules.      Current Discharge Medication List    CONTINUE these medications which have NOT CHANGED   Details  aspirin EC 81 MG tablet Take 81 mg by mouth daily.     diclofenac sodium (VOLTAREN) 1 % GEL Apply 1 application topically 3 (three) times daily as needed (knee pain).    furosemide (LASIX) 40 MG tablet Take 80 mg by mouth daily.    gabapentin (NEURONTIN) 300 MG capsule Take 2 capsules (600 mg total) by mouth 3 (three) times daily. Qty: 360 capsule, Refills: 5    isosorbide mononitrate (IMDUR) 60 MG 24 hr tablet Take 1 tablet (60 mg total) by mouth every morning. Qty: 90 tablet, Refills: 3    metFORMIN (GLUCOPHAGE) 500 MG tablet Take 1 tablet (500 mg total) by mouth 2 (two) times daily with a meal. Qty: 180 tablet, Refills: 3    omeprazole (PRILOSEC) 20 MG capsule Take 1 capsule (20 mg total) by mouth 2 (two) times daily before a meal. Qty: 180 capsule, Refills: 3    oxyCODONE-acetaminophen (PERCOCET/ROXICET) 5-325 MG per tablet Take 1 tablet by mouth 3 (three) times daily as needed for severe pain. Qty: 40 tablet, Refills: 0    potassium chloride SA (K-DUR,KLOR-CON) 20 MEQ tablet Take 1 tablet (20 mEq total) by mouth 2 (two) times daily. Qty: 180 tablet, Refills: 3    ACCU-CHEK SOFTCLIX LANCETS lancets Use as instructed to test blood sugar once daily dx: 250.42 Qty: 200 each, Refills: 1    Alcohol Swabs (B-D SINGLE USE SWABS REGULAR) PADS Use as instructed to test blood sugar once daily dx: 250.42 Qty: 200 each, Refills: 2    Blood  Glucose Calibration (ACCU-CHEK AVIVA) SOLN Use to calibrate blood glucose meter dx 250.42 Qty: 1 each, Refills: 0    glucose blood (ACCU-CHEK AVIVA PLUS) test strip Use as instructed to test blood sugar once daily dx: 250.42 Qty: 200 each, Refills: 2    lovastatin (MEVACOR) 20 MG tablet Held as of 03/31/14       Allergies  Allergen Reactions  . Crestor [Rosuvastatin Calcium] Other (See Comments)    Muscle pain/aches      The results of significant diagnostics from this hospitalization (including imaging, microbiology, ancillary and laboratory) are listed below for reference.    Significant Diagnostic Studies: Ct Head (brain) Wo Contrast  08/01/2014   CLINICAL DATA:  80 year old with left-sided facial droop, code stroke.  EXAM: CT HEAD WITHOUT CONTRAST  TECHNIQUE: Contiguous axial images were obtained from the base of the skull through the vertex without intravenous contrast.  COMPARISON:  12/03/2012 and prior CTs.  11/22/2010 and prior MRs  FINDINGS: An age indeterminate left thalamic lacunar infarct is identified and not definitely present on the prior study.  Atrophy and chronic small-vessel white matter ischemic changes are  identified.  There is no evidence of hemorrhage, mass lesion or mass effect, hydrocephalus or midline shift.  No acute or suspicious bony abnormalities are identified.  IMPRESSION: Age indeterminate left thalamic lacunar infarct without hemorrhage, which appears new since 12/03/2012.  Atrophy and chronic small-vessel white matter ischemic changes.  Critical Value/emergent results were called by telephone at the time of interpretation on 08/01/2014 at 8:11 pm to Dr. Varney Biles , who verbally acknowledged these results.   Electronically Signed   By: Hassan Rowan M.D.   On: 08/01/2014 20:14   Mr Brain Wo Contrast  08/02/2014   CLINICAL DATA:  TIA. Transient left extremity weakness and facial droop.  EXAM: MRI HEAD WITHOUT CONTRAST  MRA HEAD WITHOUT CONTRAST  TECHNIQUE:  Multiplanar, multiecho pulse sequences of the brain and surrounding structures were obtained without intravenous contrast. Angiographic images of the head were obtained using MRA technique without contrast.  COMPARISON:  Head CT 08/01/2014 and MRI 11/22/2010  FINDINGS: MRI HEAD FINDINGS  There is no evidence of acute infarct, intracranial hemorrhage, mass, midline shift, or extra-axial fluid collection. There is moderate cerebral atrophy, stable to slightly progressed from prior MRI. Patchy and confluent T2 hyperintensities in the subcortical and deep cerebral white matter bilaterally have minimally progressed from the prior MRI and are nonspecific but compatible with moderate chronic small vessel ischemic disease. Chronic lacunar infarcts are again seen in the thalami and basal ganglia bilaterally, including a chronic infarct in the left thalamus which is new from the prior MRI.  Prior bilateral cataract extraction is noted. There is mild bilateral ethmoid air cell mucosal thickening. Mastoid air cells are clear. Major intracranial vascular flow voids are preserved.  MRA HEAD FINDINGS  The visualized distal vertebral arteries are patent with the left being slightly dominant. PICA and SCA origins are patent. Basilar artery is patent without stenosis. PCAs are patent without proximal stenosis. There is mild bilateral PCA branch vessel irregularity suggestive of atherosclerosis. There may be a small, patent left posterior communicating artery.  Internal carotid arteries are patent from skullbase to carotid termini without stenosis. Anterior communicating artery is patent. ACAs and MCAs are patent without proximal stenosis. There is mild bilateral MCA and ACA branch vessel irregularity. No intracranial aneurysm is identified.  IMPRESSION: 1. No acute intracranial abnormality. 2. Moderate chronic small vessel ischemic disease, chronic lacunar infarcts, and cerebral atrophy. 3. No evidence of major intracranial arterial  occlusion or significant proximal stenosis. Branch vessel atherosclerotic irregularity.   Electronically Signed   By: Logan Bores   On: 08/02/2014 08:22   Mr Jodene Nam Head/brain Wo Cm  08/02/2014   CLINICAL DATA:  TIA. Transient left extremity weakness and facial droop.  EXAM: MRI HEAD WITHOUT CONTRAST  MRA HEAD WITHOUT CONTRAST  TECHNIQUE: Multiplanar, multiecho pulse sequences of the brain and surrounding structures were obtained without intravenous contrast. Angiographic images of the head were obtained using MRA technique without contrast.  COMPARISON:  Head CT 08/01/2014 and MRI 11/22/2010  FINDINGS: MRI HEAD FINDINGS  There is no evidence of acute infarct, intracranial hemorrhage, mass, midline shift, or extra-axial fluid collection. There is moderate cerebral atrophy, stable to slightly progressed from prior MRI. Patchy and confluent T2 hyperintensities in the subcortical and deep cerebral white matter bilaterally have minimally progressed from the prior MRI and are nonspecific but compatible with moderate chronic small vessel ischemic disease. Chronic lacunar infarcts are again seen in the thalami and basal ganglia bilaterally, including a chronic infarct in the left thalamus which is new  from the prior MRI.  Prior bilateral cataract extraction is noted. There is mild bilateral ethmoid air cell mucosal thickening. Mastoid air cells are clear. Major intracranial vascular flow voids are preserved.  MRA HEAD FINDINGS  The visualized distal vertebral arteries are patent with the left being slightly dominant. PICA and SCA origins are patent. Basilar artery is patent without stenosis. PCAs are patent without proximal stenosis. There is mild bilateral PCA branch vessel irregularity suggestive of atherosclerosis. There may be a small, patent left posterior communicating artery.  Internal carotid arteries are patent from skullbase to carotid termini without stenosis. Anterior communicating artery is patent. ACAs and  MCAs are patent without proximal stenosis. There is mild bilateral MCA and ACA branch vessel irregularity. No intracranial aneurysm is identified.  IMPRESSION: 1. No acute intracranial abnormality. 2. Moderate chronic small vessel ischemic disease, chronic lacunar infarcts, and cerebral atrophy. 3. No evidence of major intracranial arterial occlusion or significant proximal stenosis. Branch vessel atherosclerotic irregularity.   Electronically Signed   By: Logan Bores   On: 08/02/2014 08:22      Labs: Basic Metabolic Panel:  Recent Labs Lab 07/27/14 1312 08/01/14 1945 08/01/14 2004 08/03/14 0450  NA 136 136* 137 137  K 4.3 4.3 4.3 4.4  CL 96 97 99 96  CO2 27 27  --  27  GLUCOSE 94 117* 121* 103*  BUN 26* 22 24* 25*  CREATININE 1.3 1.39* 1.40* 1.43*  CALCIUM 9.5 9.6  --  9.4  PHOS 3.6  --   --   --    Liver Function Tests:  Recent Labs Lab 07/27/14 1312 08/01/14 1945  AST  --  18  ALT  --  10  ALKPHOS  --  72  BILITOT  --  0.5  PROT  --  7.2  ALBUMIN 4.2 3.7   CBC:  Recent Labs Lab 08/01/14 1945 08/01/14 2004 08/03/14 0450  WBC 5.3  --  6.7  NEUTROABS 2.2  --   --   HGB 12.6* 14.3 12.8*  HCT 37.9* 42.0 38.8*  MCV 86.5  --  86.2  PLT 195  --  205   BNP: BNP (last 3 results)  Recent Labs  10/06/13 0957  PROBNP 109.0*   CBG:  Recent Labs Lab 08/01/14 2024 08/01/14 2332  GLUCAP 118* 129*       Signed:  Nani Ravens PA-S [facility] 08/03/2014, 10:56 AM  Addendum   I personally evaluated patient on 08/03/2014 and agree with the above findings. Patient is a pleasant 80 year old gentleman with a past medical history of hypertension, coronary artery disease, dyslipidemia, diabetes mellitus, admitted to the medicine service on 07/28/2014 when he presented with complaints of left-sided weakness. Initial CT scan of brain was interpreted by radiology as having an age indeterminate left thalamic lacunar infarct without hemorrhage. Neurology  was consulted as he was admitted to telemetry placed on CVA protocol. Follow-up MRI of brain however did not show acute intracranial abnormality nor did MRA reveal evidence of major intracranial arterial occlusion or significant proximal stenosis. Having a history of atrial fibrillation with CHADS score of 3 spoke with his cardiologist Dr. Acie Fredrickson over telephone conversation regarding anticoagulation. Previously had been on anticoagulation which was discontinued approximately one year ago due to GI bleed. He has not had further episodes of GI bleed since this event. Dr Acie Fredrickson recommended continuing aspirin therapy and the question of starting anticoagulation would be addressed in the outpatient setting. Patient otherwise improved from a neurologic standpoint  with resolution to his left-sided weakness. On the day of discharge I ambulated him around the nurse's station, doing well with his walker. He appear to have resolution to focal neurological deficits. He was set up with home health services for physical therapy, RN and aide prior to discharge. He was instructed to not drive until evaluated by his physician on hospital follow-up.

## 2014-08-03 NOTE — Progress Notes (Signed)
Patient is d/c this afternoon, d/c instructions given to patient and all questions answered. Assessments remains unchanged prior to d/c.

## 2014-08-03 NOTE — Evaluation (Signed)
Speech Language Pathology Evaluation Patient Details Name: [name] Elliott MRN: [id number] DOB: May 16, 1928 Today's Date: 08/03/2014 Time: 4536-4680 SLP Time Calculation (min) (ACUTE ONLY): 23 min  Problem List:  Patient Active Problem List   Diagnosis Date Noted  . Cerebral infarction due to unspecified mechanism   . TIA (transient ischemic attack) 08/01/2014  . Varicose veins of both legs with edema 05/07/2014  . Bilateral leg pain 04/16/2014  . Edema 03/31/2014  . Chronic kidney disease, stage III (moderate) 12/21/2013  . Vascular dementia without behavioral disturbance   . Type 2 diabetes mellitus, controlled, with renal complications 32/08/2481  . Diastolic dysfunction 50/11/7046  . Knee pain, bilateral 03/01/2013  . Unspecified hereditary and idiopathic peripheral neuropathy 11/26/2012  . Osteoporosis, unspecified 11/26/2012  . GI bleed due to NSAIDs, DDX=Isch colitis, infectious colitis 11/19/2012  . Cough 05/29/2012  . 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 CONSTIPATION, CHRONIC 08/02/2010  . INSOMNIA, CHRONIC 03/08/2010  . Unspecified hypothyroidism 01/04/2010  . VITAMIN B12 DEFICIENCY 01/04/2010  . HYPERCHOLESTEROLEMIA 08/12/2009  . Essential hypertension 08/12/2009  . Coronary atherosclerosis of native coronary artery 08/12/2009  . GERD 08/12/2009   Past Medical History:  Past Medical History  Diagnosis Date  . Constipation, chronic   . Sebaceous cyst   . Other malaise and fatigue   . Pain in joint, shoulder region     Has chronic shoulder pain  . Persistent disorder of initiating or maintaining sleep   . Thrombocytopenia, unspecified   . Vascular dementia without behavioral disturbance     with periods of amnesia  . Other B-complex deficiencies   . Hypothyroidism   . Pain in limb   . Esophageal reflux   . Unspecified essential hypertension   . Arthropathy, unspecified, site unspecified   . Pure hypercholesterolemia   . Orthostasis   .  PVC's (premature ventricular contractions)   . S/P cardiac cath 08/08/11    mild to moderate CAD primarily in the LAD. None are obstructive and appear stable from prior cath in 2007; managed medically  . Arthritis   . CAD (coronary artery disease)     last cath in 2012. Managed medically-some blockages  . Headache(784.0)   . 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 MRI Nov '11 - tendonosis and partial tear. Left shoulder surgery Jan '12 for partial shoulder replacement. [name])   . Blood dyscrasia     thromboctopenia  . Pneumonia 1980's?; 2011  . History of stomach ulcers 11/2012  . Chronic lower back pain   . Atrial fibrillation   . Diabetes mellitus without complication   . DVT (deep venous thrombosis)    Past Surgical History:  Past Surgical History  Procedure Laterality Date  . Hand surgery Right     crush injury, right fifth digit contracture, limited  motion  . Lumbar spine surgery  2007    Dr Philip Aspen ([place])  . Knee surgery Left 1991    "did it twice in 1 wk" (02/17/2013)  . Cataract extraction Left 2009  . Shoulder open rotator cuff repair Left 8.30.2011  . US echocardiography  02-28-2010    Est EF 50-55%  . Cardiac catheterization  08/2011  . Shoulder hemi-arthroplasty    . Finger amputation Right     pinky finger  . Hardware removal  02/05/2012    Procedure: HARDWARE REMOVAL;  Surgeon: Johnny Bridge, MD;  Location: [place];  Service: Orthopedics;  Laterality: Left;  . Reverse shoulder arthroplasty  02/05/2012  Procedure: REVERSE SHOULDER ARTHROPLASTY;  Surgeon: Johnny Bridge, MD;  Location: [place];  Service: Orthopedics;  Laterality: Left;  . Back surgery    . Esophagogastroduodenoscopy N/A 11/19/2012    Procedure: ESOPHAGOGASTRODUODENOSCOPY (EGD);  Surgeon: Jerene Bears, MD;  Location: [place];  Service: Gastroenterology;  Laterality: N/A;   HPI:  80 y.o. male admitted to [facility] on 08/01[facility]14 with left sided weakness and left facial  droop.  Per ED notes symptoms seemed to resolve in the ED.  CT and MRI negative for acute stroke, but did reveal chronic lacunar infarcts.  Pt with significant PMHx of HTN, orthostasis, PVCs, CAD, chronic low back pain, A-fib, DM, DVT.     Assessment / Plan / Recommendation Clinical Impression  Orders received and cognitive-linguistic evaluation completed. Patient demonstrates difficulty with complex problem solving and retrieval of new information; however, patient reports and family conforms that he appears to have returned to baseline level of functioning and has assistance from family as needed.  Speech and language abilities are intact with increased time for processing; as a result, no follow up SLP service are recommended at this time.      SLP Assessment  Patient does not need any further Speech Lanaguage Pathology Services    Follow Up Recommendations  None            Pertinent Vitals/Pain Pain Assessment: No/denies pain   SLP Goals  Progression toward goals: Progressing toward goals Patient/Family Stated Goal: to go home  SLP Evaluation Prior Functioning  Cognitive/Linguistic Baseline: Baseline deficits Baseline deficit details: daughter managed medications and finances at baseline  Type of Home: House  Lives With: Alone Available Help at Discharge: Family;Available PRN/intermittently Education: did not complete the 3rd grade  Vocation: Retired   Associate Professor  Overall Cognitive Status: History of cognitive impairments - at baseline Arousal/Alertness: Awake/alert Orientation Level: Oriented X4 Attention: Selective Selective Attention: Appears intact Memory: Impaired Memory Impairment: Decreased recall of new information;Retrieval deficit Awareness: Appears intact Problem Solving:  (with basic ) Safety/Judgment: Appears intact    Comprehension  Auditory Comprehension Overall Auditory Comprehension:  (Mod I; increasd time) Visual  Recognition/Discrimination Discrimination: Within Function Limits Reading Comprehension Reading Status: Within funtional limits Functional Environmental (signs, name badge): Within functional limits    Expression Expression Primary Mode of Expression: Verbal Verbal Expression Overall Verbal Expression: Appears within functional limits for tasks assessed (Mod I; increased time) Written Expression Dominant Hand: Right Written Expression: Within Functional Limits   Oral / Motor Oral Motor/Sensory Function Overall Oral Motor/Sensory Function: Appears within functional limits for tasks assessed Motor Speech Overall Motor Speech: Appears within functional limits for tasks assessed   GO    Carmelia Roller., CCC-SLP [phone]  [name] Elliott 08/03/2014, 10:21 AM

## 2014-08-03 NOTE — Progress Notes (Signed)
CARE MANAGEMENT NOTE 08/03/2014  Patient:  [name] Elliott, [name] Elliott   Account Number:  [id number]  Date Initiated:  08/03/2014  Documentation initiated by:  [facility]  Subjective/Objective Assessment:   TIA     Action/Plan:   lives alone   Anticipated DC Date:  08/03/2014   Anticipated DC Plan:  [place]  CM consult      [facility] Choice  HOME HEALTH   Choice offered to / List presented to:  C-1 Patient        [place] arranged  [place] RN      [place].   Status of service:  Completed, signed off Medicare Important Message given?  NA - LOS <3 / Initial given by admissions (If response is "NO", the following Medicare IM given date fields will be blank) Date Medicare IM given:   Medicare IM given by:   Date Additional Medicare IM given:   Additional Medicare IM given by:    Discharge Disposition:  [place]  Per UR Regulation:  Reviewed for med. necessity/level of care/duration of stay  If discussed at [place] of Stay Meetings, dates discussed:    Comments:  08/03/2014 1130 NCM spoke to pt and states he lives at home alone. Offered choice for [facility]. Pt agreeable to [facility] for HH. Contacted [facility] for [facility] for scheduled dc today. Pt states he has RW at home. Grand-dtr and brother will assist him in the home if needed. Jonnie Finner RN CCM Case Mgmt phone [phone]

## 2014-08-03 NOTE — Progress Notes (Signed)
STROKE TEAM PROGRESS NOTE   HISTORY [name] Elliott is an 80 y.o. male history of essential hypertension, hypercholesterolemia, diabetes mellitus, atrial fibrillation and coronary artery disease, brought to the emergency room following onset of left-sided weakness. He was noted to have facial droop as well as weakness of left extremities. Patient has been on aspirin daily. He has not been on anticoagulation. There is no previous history of stroke nor TIA. CT scan of the head showed a small, age indeterminate left thalamic infarction was otherwise unremarkable. NIH stroke score was 0 at the time of [name] evaluation, as deficits had resolved. He was last seen normal 6 PM on 08/01/2014. Patient was not administered TPA secondary to deficits resolved. He was admitted for further evaluation and treatment.   SUBJECTIVE (INTERVAL HISTORY) 'I don't understand what you are talking about". Dr. Leonie Man addressed questions and issues with [name] Elliott who is demented at baseline. Cardiologist note suggests decision on anticoagulation to be made later at outpatient follow-up visit   OBJECTIVE Temp:  [97.4 F (36.3 C)-98.9 F (37.2 C)] 98.9 F (37.2 C) (12/01 1043) Pulse Rate:  [52-65] 63 (12/01 1043) Cardiac Rhythm:  [-] Normal sinus rhythm (12/01 0915) Resp:  [16-20] 18 (12/01 1043) BP: (95-124)/(43-85) 124/58 mmHg (12/01 1043) SpO2:  [95 %-99 %] 97 % (12/01 1043)   Recent Labs Lab 08/01/14 2024 08/01/14 2332  GLUCAP 118* 129*    Recent Labs Lab 07/27/14 1312 08/01/14 1945 08/01/14 2004 08/03/14 0450  NA 136 136* 137 137  K 4.3 4.3 4.3 4.4  CL 96 97 99 96  CO2 27 27  --  27  GLUCOSE 94 117* 121* 103*  BUN 26* 22 24* 25*  CREATININE 1.3 1.39* 1.40* 1.43*  CALCIUM 9.5 9.6  --  9.4  PHOS 3.6  --   --   --     Recent Labs Lab 07/27/14 1312 08/01/14 1945  AST  --  18  ALT  --  10  ALKPHOS  --  72  BILITOT  --  0.5  PROT  --  7.2  ALBUMIN 4.2 3.7    Recent Labs Lab 08/01/14 1945  08/01/14 2004 08/03/14 0450  WBC 5.3  --  6.7  NEUTROABS 2.2  --   --   HGB 12.6* 14.3 12.8*  HCT 37.9* 42.0 38.8*  MCV 86.5  --  86.2  PLT 195  --  205   No results for input(s): CKTOTAL, CKMB, CKMBINDEX, TROPONINI in the last 168 hours.  Recent Labs  08/01/14 1945  LABPROT 13.5  INR 1.02   No results for input(s): COLORURINE, LABSPEC, PHURINE, GLUCOSEU, HGBUR, BILIRUBINUR, KETONESUR, PROTEINUR, UROBILINOGEN, NITRITE, LEUKOCYTESUR in the last 72 hours.  Invalid input(s): APPERANCEUR     Component Value Date/Time   CHOL 155 08/02/2014 0334   TRIG 46 08/02/2014 0334   HDL 59 08/02/2014 0334   CHOLHDL 2.6 08/02/2014 0334   VLDL 9 08/02/2014 0334   LDLCALC 87 08/02/2014 0334   Lab Results  Component Value Date   HGBA1C 6.4* 08/02/2014   No results found for: LABOPIA, COCAINSCRNUR, LABBENZ, AMPHETMU, THCU, LABBARB  No results for input(s): ETH in the last 168 hours.  Ct Head (brain) Wo Contrast  08/01/2014   CLINICAL DATA:  80 year old with left-sided facial droop, code stroke.  EXAM: CT HEAD WITHOUT CONTRAST  TECHNIQUE: Contiguous axial images were obtained from the base of the skull through the vertex without intravenous contrast.  COMPARISON:  12/03/2012 and prior CTs.  11/22/2010 and  prior MRs  FINDINGS: An age indeterminate left thalamic lacunar infarct is identified and not definitely present on the prior study.  Atrophy and chronic small-vessel white matter ischemic changes are identified.  There is no evidence of hemorrhage, mass lesion or mass effect, hydrocephalus or midline shift.  No acute or suspicious bony abnormalities are identified.  IMPRESSION: Age indeterminate left thalamic lacunar infarct without hemorrhage, which appears new since 12/03/2012.  Atrophy and chronic small-vessel white matter ischemic changes.  Critical Value/emergent results were called by telephone at the time of interpretation on 08/01/2014 at 8:11 pm to Dr. Varney Biles , who verbally  acknowledged these results.   Electronically Signed   By: Hassan Rowan M.D.   On: 08/01/2014 20:14   Mr Brain Wo Contrast  08/02/2014   CLINICAL DATA:  TIA. Transient left extremity weakness and facial droop.  EXAM: MRI HEAD WITHOUT CONTRAST  MRA HEAD WITHOUT CONTRAST  TECHNIQUE: Multiplanar, multiecho pulse sequences of the brain and surrounding structures were obtained without intravenous contrast. Angiographic images of the head were obtained using MRA technique without contrast.  COMPARISON:  Head CT 08/01/2014 and MRI 11/22/2010  FINDINGS: MRI HEAD FINDINGS  There is no evidence of acute infarct, intracranial hemorrhage, mass, midline shift, or extra-axial fluid collection. There is moderate cerebral atrophy, stable to slightly progressed from prior MRI. Patchy and confluent T2 hyperintensities in the subcortical and deep cerebral white matter bilaterally have minimally progressed from the prior MRI and are nonspecific but compatible with moderate chronic small vessel ischemic disease. Chronic lacunar infarcts are again seen in the thalami and basal ganglia bilaterally, including a chronic infarct in the left thalamus which is new from the prior MRI.  Prior bilateral cataract extraction is noted. There is mild bilateral ethmoid air cell mucosal thickening. Mastoid air cells are clear. Major intracranial vascular flow voids are preserved.  MRA HEAD FINDINGS  The visualized distal vertebral arteries are patent with the left being slightly dominant. PICA and SCA origins are patent. Basilar artery is patent without stenosis. PCAs are patent without proximal stenosis. There is mild bilateral PCA branch vessel irregularity suggestive of atherosclerosis. There may be a small, patent left posterior communicating artery.  Internal carotid arteries are patent from skullbase to carotid termini without stenosis. Anterior communicating artery is patent. ACAs and MCAs are patent without proximal stenosis. There is mild  bilateral MCA and ACA branch vessel irregularity. No intracranial aneurysm is identified.  IMPRESSION: 1. No acute intracranial abnormality. 2. Moderate chronic small vessel ischemic disease, chronic lacunar infarcts, and cerebral atrophy. 3. No evidence of major intracranial arterial occlusion or significant proximal stenosis. Branch vessel atherosclerotic irregularity.   Electronically Signed   By: Logan Bores   On: 08/02/2014 08:22   Mr Jodene Nam Head/brain Wo Cm  08/02/2014   CLINICAL DATA:  TIA. Transient left extremity weakness and facial droop.  EXAM: MRI HEAD WITHOUT CONTRAST  MRA HEAD WITHOUT CONTRAST  TECHNIQUE: Multiplanar, multiecho pulse sequences of the brain and surrounding structures were obtained without intravenous contrast. Angiographic images of the head were obtained using MRA technique without contrast.  COMPARISON:  Head CT 08/01/2014 and MRI 11/22/2010  FINDINGS: MRI HEAD FINDINGS  There is no evidence of acute infarct, intracranial hemorrhage, mass, midline shift, or extra-axial fluid collection. There is moderate cerebral atrophy, stable to slightly progressed from prior MRI. Patchy and confluent T2 hyperintensities in the subcortical and deep cerebral white matter bilaterally have minimally progressed from the prior MRI and are nonspecific but compatible  with moderate chronic small vessel ischemic disease. Chronic lacunar infarcts are again seen in the thalami and basal ganglia bilaterally, including a chronic infarct in the left thalamus which is new from the prior MRI.  Prior bilateral cataract extraction is noted. There is mild bilateral ethmoid air cell mucosal thickening. Mastoid air cells are clear. Major intracranial vascular flow voids are preserved.  MRA HEAD FINDINGS  The visualized distal vertebral arteries are patent with the left being slightly dominant. PICA and SCA origins are patent. Basilar artery is patent without stenosis. PCAs are patent without proximal stenosis. There  is mild bilateral PCA branch vessel irregularity suggestive of atherosclerosis. There may be a small, patent left posterior communicating artery.  Internal carotid arteries are patent from skullbase to carotid termini without stenosis. Anterior communicating artery is patent. ACAs and MCAs are patent without proximal stenosis. There is mild bilateral MCA and ACA branch vessel irregularity. No intracranial aneurysm is identified.  IMPRESSION: 1. No acute intracranial abnormality. 2. Moderate chronic small vessel ischemic disease, chronic lacunar infarcts, and cerebral atrophy. 3. No evidence of major intracranial arterial occlusion or significant proximal stenosis. Branch vessel atherosclerotic irregularity.   Electronically Signed   By: Logan Bores   On: 08/02/2014 08:22   Carotid Doppler  There is 1-39% bilateral ICA stenosis. Vertebral artery flow is antegrade.    2D Echocardiogram  EF 45-50% with no source of embolus.   PHYSICAL EXAM Frail elderly Caucasian male not in distress.Awake alert. Afebrile. Head is nontraumatic. Neck is supple without bruit. Hearing is normal. Cardiac exam no murmur or gallop. Lungs are clear to auscultation. Distal pulses are well felt.  Neurological Exam : awake alert anxious male. Oriented 2. Diminished attention, registration and recall. Follows one and occasional two-step commands. Extraocular movements are full range without nystagmus. Fundi not visualized bilaterally. Fundi were not visualized. Face is symmetric without weakness. Motor system exam reveals no upper or lower extremity drift. No focal weakness. Sensation seems preserved bilaterally. Coordination slow but accurate. Gait not tested.   ASSESSMENT/PLAN [name] Elliott is a 80 y.o. male with history of hypertension, hypercholesterolemia, diabetes mellitus, atrial fibrillation and coronary artery disease presenting with left-sided weakness. He did not receive IV t-PA due to deficits resolved.   TIA -  nondominant right brain  Resultant  Left hemiparesis resolved  MRI  No acute stroke  MRA  Unremarkable for large vessel stenosis  Carotid Doppler  No significant stenosis  2D Echo  No source of embolus  Heparin 5000 units sq tid for VTE prophylaxis  Diet heart healthy/carb modified thin liquids  aspirin 81 mg orally every day prior to admission, now on aspirin 81 mg orally every day  Ongoing aggressive stroke risk factor management  Therapy recommendations:  No PT needs  Disposition:  Return home  Atrial Fibrillation  Home meds:  No anticoagulation for past 1 year when he was treated for upper GI bleed secondary to peptic ulcer disease  ChadsVasc score > 2, anticoagulation recommended  Dr. Acie Fredrickson follows as OP. Plans to discuss restarting anticoagulation follow-up office visit next week.  At [name] time, patient is not an anticoagulation candidate due to history of major GI bleed  Continue aspirin for now  Hypertension  Stable  Hyperlipidemia  Home meds:  mevacor 20 mg listed as not being taken since July 2015  LDL 87, goal < 70  Resume mevacor if patient agreeable (patient is allergic to Crestor)  Diabetes  HgbA1c 7.3 goal < 7.0  Other  Stroke Risk Factors  Advanced age  Obesity, Body mass index is 35.02 kg/(m^2).   Coronary artery disease  Other Active Problems  Baseline vascular dementia  Hospital day # 2  BIBY,SHARON  08/03/2014 11:20 AM  I have personally examined [name] patient, reviewed notes, independently viewed imaging studies, participated in medical decision making and plan of care. I have made any additions or clarifications directly to the above note. Agree with note above.  Dr. Acie Fredrickson cardiologist to decide on anticoagulation and outpatient visit. Antony Contras, MD Medical Director [place] Pager: [phone] 08/03/2014 11:20 AM    To contact Stroke Continuity provider, please refer to [email or web address] After hours,  contact General Neurology

## 2014-08-04 LAB — GLUCOSE, CAPILLARY: GLUCOSE-CAPILLARY: 107 mg/dL — AB (ref 70–99)

## 2014-08-09 ENCOUNTER — Encounter: Payer: Self-pay | Admitting: Surgery

## 2014-08-09 ENCOUNTER — Ambulatory Visit (INDEPENDENT_AMBULATORY_CARE_PROVIDER_SITE_OTHER): Payer: Commercial Managed Care - HMO | Admitting: Surgery

## 2014-08-09 VITALS — BP 113/65 | HR 71 | Ht 64.0 in | Wt 152.8 lb

## 2014-08-09 DIAGNOSIS — L089 Local infection of the skin and subcutaneous tissue, unspecified: Secondary | ICD-10-CM

## 2014-08-09 MED ORDER — CEPHALEXIN 500 MG PO CAPS: 500.0000 mg | ORAL_CAPSULE | Freq: Three times a day (TID) | ORAL | Status: AC

## 2014-08-09 NOTE — Progress Notes (Signed)
Patient name: [name] Elliott MRN: [id number] DOB: 06/29/1928 Sex: male     Chief Complaint  Patient presents with  . Re-evaluation    3 month f/u     HISTORY OF PRESENT ILLNESS: This is an 80 year old gentleman who comes in today for evaluation of leg pain. He states that he has significant pain and swelling in his legs which has been going on for several months and getting worse. The right leg bothers him more so than the left. He reports having some form of the vein procedure done about 10 years ago. He complains that when he walks his legs get more swollen and more painful.  His ultrasound showed bilateral deep vein reflux and right superficial vein reflux.  He was unable her stockings, as he states they made him worse.  He was recently admitted with a TIA.  He is not on anticoagulation, just aspirin  Past Medical History  Diagnosis Date  . Constipation, chronic   . Sebaceous cyst   . Other malaise and fatigue   . Pain in joint, shoulder region     Has chronic shoulder pain  . Persistent disorder of initiating or maintaining sleep   . Thrombocytopenia, unspecified   . Vascular dementia without behavioral disturbance     with periods of amnesia  . Other B-complex deficiencies   . Hypothyroidism   . Pain in limb   . Esophageal reflux   . Unspecified essential hypertension   . Arthropathy, unspecified, site unspecified   . Pure hypercholesterolemia   . Orthostasis   . PVC's (premature ventricular contractions)   . S/P cardiac cath 08/08/11    mild to moderate CAD primarily in the LAD. None are obstructive and appear stable from prior cath in 2007; managed medically  . Arthritis   . CAD (coronary artery disease)     last cath in 2012. Managed medically-some blockages  . Headache(784.0)   . 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 MRI Nov '11 - tendonosis and partial tear. Left shoulder surgery Jan '12 for partial shoulder replacement.  [name])   . Blood dyscrasia     thromboctopenia  . Pneumonia 1980's?; 2011  . History of stomach ulcers 11/2012  . Chronic lower back pain   . Atrial fibrillation   . Diabetes mellitus without complication   . DVT (deep venous thrombosis)   . Stroke     Past Surgical History  Procedure Laterality Date  . Hand surgery Right     crush injury, right fifth digit contracture, limited  motion  . Lumbar spine surgery  2007    Dr Philip Aspen ([place])  . Knee surgery Left 1991    "did it twice in 1 wk" (02/17/2013)  . Cataract extraction Left 2009  . Shoulder open rotator cuff repair Left 8.30.2011  . US echocardiography  02-28-2010    Est EF 50-55%  . Cardiac catheterization  08/2011  . Shoulder hemi-arthroplasty    . Finger amputation Right     pinky finger  . Hardware removal  02/05/2012    Procedure: HARDWARE REMOVAL;  Surgeon: Johnny Bridge, MD;  Location: [place];  Service: Orthopedics;  Laterality: Left;  . Reverse shoulder arthroplasty  02/05/2012    Procedure: REVERSE SHOULDER ARTHROPLASTY;  Surgeon: Johnny Bridge, MD;  Location: [place];  Service: Orthopedics;  Laterality: Left;  . Back surgery    . Esophagogastroduodenoscopy N/A 11/19/2012    Procedure: ESOPHAGOGASTRODUODENOSCOPY (EGD);  Surgeon: Lajuan Lines  Pyrtle, MD;  Location: [place];  Service: Gastroenterology;  Laterality: N/A;    History   Social History  . Marital Status: Widowed    Spouse Name: N/A    Number of Children: 2  . Years of Education: N/A   Occupational History  . retired    Social History Main Topics  . Smoking status: Former Smoker    Types: Cigars    Quit date: 09/03/1978  . Smokeless tobacco: Current User    Types: Chew     Comment: 02/17/2013 "I aien't smoked a cigar in 20-30 yr"  . Alcohol Use: No  . Drug Use: No  . Sexual Activity: No   Other Topics Concern  . Not on file   Social History Narrative   2nd grade education. Married '58, '95. 2 dtr '59, '64, youngest daughter died  septic kidney.    Lives alone, handles all ADLs      Has living will   Daughter [name] is health care POA   Would accept resuscitation attempts--- but no prolonged ventilation.   Not sure about tube feeds.                Family History  Problem Relation Age of Onset  . Breast cancer Mother   . Cancer Mother   . Diabetes Mother   . Cancer Brother     throat  . Diabetes Daughter   . Colon cancer Neg Hx   . Kidney disease Father   . Diabetes Sister     Allergies as of 08/09/2014 - Review Complete 08/09/2014  Allergen Reaction Noted  . Crestor [rosuvastatin calcium] Other (See Comments) 05/14/2011    Current Outpatient Prescriptions on File Prior to Visit  Medication Sig Dispense Refill  . ACCU-CHEK SOFTCLIX LANCETS lancets Use as instructed to test blood sugar once daily dx: 250.42 200 each 1  . Alcohol Swabs (B-D SINGLE USE SWABS REGULAR) PADS Use as instructed to test blood sugar once daily dx: 250.42 200 each 2  . aspirin EC 81 MG tablet Take 81 mg by mouth daily.     . Blood Glucose Calibration (ACCU-CHEK AVIVA) SOLN Use to calibrate blood glucose meter dx 250.42 1 each 0  . diclofenac sodium (VOLTAREN) 1 % GEL Apply 1 application topically 3 (three) times daily as needed (knee pain).    . furosemide (LASIX) 40 MG tablet Take 80 mg by mouth daily.    Marland Kitchen gabapentin (NEURONTIN) 300 MG capsule Take 2 capsules (600 mg total) by mouth 3 (three) times daily. 360 capsule 5  . glucose blood (ACCU-CHEK AVIVA PLUS) test strip Use as instructed to test blood sugar once daily dx: 250.42 200 each 2  . isosorbide mononitrate (IMDUR) 60 MG 24 hr tablet Take 1 tablet (60 mg total) by mouth every morning. 90 tablet 3  . metFORMIN (GLUCOPHAGE) 500 MG tablet Take 1 tablet (500 mg total) by mouth 2 (two) times daily with a meal. 180 tablet 3  . omeprazole (PRILOSEC) 20 MG capsule Take 1 capsule (20 mg total) by mouth 2 (two) times daily before a meal. 180 capsule 3  . oxyCODONE-acetaminophen  (PERCOCET/ROXICET) 5-325 MG per tablet Take 1 tablet by mouth 3 (three) times daily as needed for severe pain. 40 tablet 0  . potassium chloride SA (K-DUR,KLOR-CON) 20 MEQ tablet Take 1 tablet (20 mEq total) by mouth 2 (two) times daily. 180 tablet 3  . [DISCONTINUED] Hyoscyamine Sulfate (HYOMAX-DT) 0.375 MG TBCR Take 1 tab bid for 5 days,  then prn abd pain 30 each 1   No current facility-administered medications on file prior to visit.     REVIEW OF SYSTEMS: Please see history of present illness, otherwise no changes   PHYSICAL EXAMINATION:   Vital signs are BP 113/65 mmHg  Pulse 71  Ht 5\' 4"  (1.626 m)  Wt 152 lb 12.8 oz (69.31 kg)  BMI 26.22 kg/m2  SpO2 100% General: The patient appears their stated age. HEENT:  No gross abnormalities Pulmonary:  Non labored breathing Musculoskeletal: There are no major deformities. Neurologic: No focal weakness or paresthesias are detected, Skin: There are no ulcer or rashes noted. Psychiatric: The patient has normal affect. Cardiovascular: Palpable pedal pulses.  Significant bilateral edema with slight erythema along the gator egion   Diagnostic Studies None  Assessment: Bilateral venous insufficiency Plan: I discussed with the patient and his family, that the best course of action is going to be leg elevation and compression stockings.  The patient has been unable to tolerate stockings.  He states they make his pain worse.  He does have slight erythema around bilateral ankles.  Therefore I'm going to give him a trial of antibiotics to see if this helps.  Even though he has superficial vein reflux on the right, I am not sure that treating this will make much difference since he has bilateral pain and high lateral deep vein reflux.  The patient will contact me further questioning  V. Leia Alf, M.D. [facility] Office: [phone] Pager:  [phone]

## 2014-08-18 ENCOUNTER — Other Ambulatory Visit: Payer: Self-pay | Admitting: *Deleted

## 2014-08-18 ENCOUNTER — Encounter: Payer: Self-pay | Admitting: Physician Assistant

## 2014-08-18 ENCOUNTER — Ambulatory Visit: Payer: Commercial Managed Care - HMO | Admitting: Internal Medicine

## 2014-08-18 ENCOUNTER — Ambulatory Visit (INDEPENDENT_AMBULATORY_CARE_PROVIDER_SITE_OTHER): Payer: Commercial Managed Care - HMO | Admitting: Physician Assistant

## 2014-08-18 VITALS — BP 120/65 | HR 65 | Ht 64.0 in | Wt 155.0 lb

## 2014-08-18 DIAGNOSIS — I491 Atrial premature depolarization: Secondary | ICD-10-CM

## 2014-08-18 DIAGNOSIS — I10 Essential (primary) hypertension: Secondary | ICD-10-CM

## 2014-08-18 DIAGNOSIS — I429 Cardiomyopathy, unspecified: Secondary | ICD-10-CM

## 2014-08-18 DIAGNOSIS — I2510 Atherosclerotic heart disease of native coronary artery without angina pectoris: Secondary | ICD-10-CM

## 2014-08-18 DIAGNOSIS — I480 Paroxysmal atrial fibrillation: Secondary | ICD-10-CM

## 2014-08-18 DIAGNOSIS — Z8673 Personal history of transient ischemic attack (TIA), and cerebral infarction without residual deficits: Secondary | ICD-10-CM

## 2014-08-18 NOTE — Patient Instructions (Signed)
Your physician recommends that you continue on your current medications as directed. Please refer to the Current Medication list given to you today.  Your physician has recommended that you wear an event monitor. Event monitors are medical devices that record the heart's electrical activity. Doctors most often [place] these monitors to diagnose arrhythmias. Arrhythmias are problems with the speed or rhythm of the heartbeat. The monitor is a small, portable device. You can wear one while you do your normal daily activities. This is usually used to diagnose what is causing palpitations/syncope (passing out).  KEEP YOUR APPT WITH DR. Acie Fredrickson 10/20/14 8:30

## 2014-08-18 NOTE — Progress Notes (Signed)
Cardiology Office Note   Date:  08/18/2014   ID:  [name] Elliott, DOB [birth year], MRN [id number]  PCP:  Viviana Simpler, MD  Cardiologist:  Dr. Liam Rogers     History of Present Illness: [name] Elliott is a 80 y.o. male with a hx of CAD that is treated medically, HTN, HL, DM2, hypothyroidism, CKD, LE edema from venous insufficiency, dementia, prior UGI bleed 11/2012.  His chart also indicates a hx of AFib.  I cannot locate where this dx came from upon thorough review of the chart.  There was no mention of anticoagulation other than aspirin at the time of his upper GI bleed in 2014. There is no significant drop in his hemoglobin at that time.  He was taking a combination of NSAIDs, bisphosphonates and Namenda at that time.  His Hgb went from 15.3 >> 12.6 in 11/2012.    He was admitted 11/29-12/1 with facial droop and L sided weakness.  Symptoms resolved upon arrival to the ED.  He was seen by neurology and felt to have suffered a R subcortical TIA.  Echocardiogram did demonstrated lower EF at 45-50%, diffuse HK and mild to mod AI, mild MR. There was no source of embolus.  MRI was negative for acute CVA.   Dr. Liam Rogers was consulted by phone regarding the need for anticoagulation.  Given his prior hx of GI bleed, decision was made to hold off on starting anything other than ASA.    He returns for FU.  He is here today with his daughter. He has been doing well since discharge from the hospital. He denies chest pain, significant dyspnea, see, orthopnea, PND. He does have chronic LE edema without significant change. He has a chronic cough. He denies any melena or hematochezia.   Studies:   - LHC (12/12):  Proximal LAD 30-40%, D1 50-60%, proximal circumflex 20-30%, RCA with minor little irregularities, preserved LV function  - Echo (11/15):  EF 45-50%, diffuse HK, grade 1 diastolic dysfunction, mild to moderate AI, mild MR  - Nuclear (9/12):  Small area of apical ischemia, EF 50%  - Carotid US  (11/15):  Summary:  Findings suggest 1-39% internal carotid artery stenosis bilaterally.   Recent Labs: 09/25/2013: Direct LDL 140.6 10/06/2013: Pro B Natriuretic peptide (BNP) 109.0* 12/21/2013: TSH 2.82 08/01/2014: ALT 10 08/02/2014: LDL (calc) 87 08/03/2014: BUN 25*; Creatinine 1.43*; Hemoglobin 12.8*; Potassium 4.4; Sodium 137   Estimated Creatinine Clearance: 32.2 mL/min (by C-G formula based on Cr of 1.43).      Recent Radiology:     Wt Readings from Last 3 Encounters:  08/09/14 152 lb 12.8 oz (69.31 kg)  08/02/14 204 lb 1.6 oz (92.579 kg)  07/27/14 151 lb 12 oz (68.833 kg)     Past Medical History  Diagnosis Date  . Constipation, chronic   . Sebaceous cyst   . Other malaise and fatigue   . Pain in joint, shoulder region     Has chronic shoulder pain  . Persistent disorder of initiating or maintaining sleep   . Thrombocytopenia, unspecified   . Vascular dementia without behavioral disturbance     with periods of amnesia  . Other B-complex deficiencies   . Hypothyroidism   . Pain in limb   . Esophageal reflux   . Unspecified essential hypertension   . Arthropathy, unspecified, site unspecified   . Pure hypercholesterolemia   . Orthostasis   . PVC's (premature ventricular contractions)   . S/P cardiac cath 08/08/11  mild to moderate CAD primarily in the LAD. None are obstructive and appear stable from prior cath in 2007; managed medically  . Arthritis   . CAD (coronary artery disease)     last cath in 2012. Managed medically-some blockages  . Headache(784.0)   . 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 MRI Nov '11 - tendonosis and partial tear. Left shoulder surgery Jan '12 for partial shoulder replacement. [name])   . Blood dyscrasia     thromboctopenia  . Pneumonia 1980's?; 2011  . History of stomach ulcers 11/2012  . Chronic lower back pain   . Atrial fibrillation   . Diabetes mellitus without complication   . DVT (deep venous  thrombosis)   . Stroke     Current Outpatient Prescriptions  Medication Sig Dispense Refill  . ACCU-CHEK SOFTCLIX LANCETS lancets Use as instructed to test blood sugar once daily dx: 250.42 200 each 1  . Alcohol Swabs (B-D SINGLE USE SWABS REGULAR) PADS Use as instructed to test blood sugar once daily dx: 250.42 200 each 2  . aspirin EC 81 MG tablet Take 81 mg by mouth daily.     . Blood Glucose Calibration (ACCU-CHEK AVIVA) SOLN Use to calibrate blood glucose meter dx 250.42 1 each 0  . cephALEXin (KEFLEX) 500 MG capsule Take 1 capsule (500 mg total) by mouth 3 (three) times daily. 30 capsule 0  . diclofenac sodium (VOLTAREN) 1 % GEL Apply 1 application topically 3 (three) times daily as needed (knee pain).    . furosemide (LASIX) 40 MG tablet Take 80 mg by mouth daily.    Marland Kitchen gabapentin (NEURONTIN) 300 MG capsule Take 2 capsules (600 mg total) by mouth 3 (three) times daily. 360 capsule 5  . glucose blood (ACCU-CHEK AVIVA PLUS) test strip Use as instructed to test blood sugar once daily dx: 250.42 200 each 2  . isosorbide mononitrate (IMDUR) 60 MG 24 hr tablet Take 1 tablet (60 mg total) by mouth every morning. 90 tablet 3  . metFORMIN (GLUCOPHAGE) 500 MG tablet Take 1 tablet (500 mg total) by mouth 2 (two) times daily with a meal. 180 tablet 3  . omeprazole (PRILOSEC) 20 MG capsule Take 1 capsule (20 mg total) by mouth 2 (two) times daily before a meal. 180 capsule 3  . oxyCODONE-acetaminophen (PERCOCET/ROXICET) 5-325 MG per tablet Take 1 tablet by mouth 3 (three) times daily as needed for severe pain. 40 tablet 0  . potassium chloride SA (K-DUR,KLOR-CON) 20 MEQ tablet Take 1 tablet (20 mEq total) by mouth 2 (two) times daily. 180 tablet 3  . [DISCONTINUED] Hyoscyamine Sulfate (HYOMAX-DT) 0.375 MG TBCR Take 1 tab bid for 5 days, then prn abd pain 30 each 1   No current facility-administered medications for this visit.     Allergies:   Crestor   Social History:  The patient  reports that  he quit smoking about 35 years ago. His smoking use included Cigars. His smokeless tobacco use includes Chew. He reports that he does not drink alcohol or use illicit drugs.   Family History:  The patient's family history includes Breast cancer in his mother; Cancer in his brother and mother; Diabetes in his daughter, mother, and sister; Kidney disease in his father. There is no history of Colon cancer.    ROS:  Please see the history of present illness.   All other systems reviewed and negative.    PHYSICAL EXAM: VS:  BP 120/65 mmHg  Pulse 65  Ht 5\' 4"  (1.626 m)  Wt 155 lb (70.308 kg)  BMI 26.59 kg/m2 Well nourished, well developed, in no acute distress HEENT: normal Neck: no JVD Cardiac:  normal S1, S2;  RRR; no murmur   Lungs:   clear to auscultation bilaterally, no wheezing, rhonchi or rales Abd: soft, nontender, no hepatomegaly Ext: trace bilateral LE edema Skin: warm and dry Neuro:  CNs 2-12 intact, no focal abnormalities noted  EKG:  NSR, HR 65, frequent PACs, incomplete RBBB      ASSESSMENT AND PLAN:   1.  Atrial Fibrillation: As noted, I cannot locate where this diagnosis came from. I reviewed his telemetry strips that are available in his electronic medical record. He does have one episode of SVT of about 20 beats. Otherwise, his heart rates tend to stay in the 60s. I am not certain that he would tolerate a beta blocker at this time.  I spent about 30 minutes going through his chart. It does not appear that he was ever on Coumadin or one of the NOACs.  The patient's daughter seems to think that he has been told in the past that he has atrial fibrillation. If he indeed has atrial fibrillation, his risk for stroke would be great.  But, he does have a history of upper GI bleeding. I reviewed his chart. This did not appear to be a life-threatening bleed. However, he does have significant risk factors.  CHADS2-VASc=8; HAS-BLED=6 with a 14.1% annual risk of CVA and a 10% annual risk  of major bleed.    -  Arrange 30 day event monitor to assess atrial fibrillation burden.    -  I will review further with Dr. Acie Fredrickson. 2.  TIA: For now, I would continue on aspirin only. 3.  Hypertension:  Controlled. 4.  Coronary Artery Disease:  Continue aspirin, nitrates. 5.  Cardiomyopathy: EF by recent echocardiogram is 45-50%. This is fairly close to previous measurements. As noted, I am not certain that he is a candidate for beta blocker therapy at this time. I suspect he may not tolerate it due to low heart rates. Also, with his chronic kidney disease, I am not certain that he would tolerate an ACE inhibitor. His blood pressure would not likely tolerate the addition of hydralazine. His cardiomyopathy will be followed and we can make further decisions over time regarding titration of CHF medications.   Disposition:   FU with Dr. Liam Rogers 4-6 weeks.    Signed, Versie Starks, MHS 08/18/2014 8:31 AM    [facility] [place], [place], [place]  [address] Phone: [phone]; Fax: [phone]

## 2014-08-19 ENCOUNTER — Encounter: Payer: Self-pay | Admitting: *Deleted

## 2014-08-19 ENCOUNTER — Encounter (INDEPENDENT_AMBULATORY_CARE_PROVIDER_SITE_OTHER): Payer: Commercial Managed Care - HMO

## 2014-08-19 DIAGNOSIS — I2510 Atherosclerotic heart disease of native coronary artery without angina pectoris: Secondary | ICD-10-CM

## 2014-08-19 DIAGNOSIS — I491 Atrial premature depolarization: Secondary | ICD-10-CM

## 2014-08-19 NOTE — Progress Notes (Signed)
Patient ID: [name] Elliott, male   DOB: 1927-10-26, 80 y.o.   MRN: [id number] [employer] verite 30 day cardiac event monitor applied to patient.

## 2014-08-20 ENCOUNTER — Encounter: Payer: Self-pay | Admitting: Cardiology

## 2014-08-23 DIAGNOSIS — Z8673 Personal history of transient ischemic attack (TIA), and cerebral infarction without residual deficits: Secondary | ICD-10-CM

## 2014-08-23 DIAGNOSIS — I2510 Atherosclerotic heart disease of native coronary artery without angina pectoris: Secondary | ICD-10-CM

## 2014-08-23 DIAGNOSIS — E119 Type 2 diabetes mellitus without complications: Secondary | ICD-10-CM

## 2014-08-23 DIAGNOSIS — I4891 Unspecified atrial fibrillation: Secondary | ICD-10-CM

## 2014-09-01 ENCOUNTER — Telehealth: Payer: Self-pay | Admitting: *Deleted

## 2014-09-01 NOTE — Telephone Encounter (Signed)
Patient notified [employer] of monitor malfunction 08-26-14.  [employer] sending out new monitor to arrive 09/02/14.  Instructed to apply new monitor to patient, pull white tab on back of monitor and record an event.  Call [employer] to assure baseline recording went through.  Confirm with [employer] that service period will be extended 7 days that patient was without a functioning monitor.

## 2014-09-01 NOTE — Telephone Encounter (Signed)
Patients POA had questions regarding [employer] monitor.  Needed to send monitor back to [employer] because it was not working.  Initial call to [employer] was 12.24.15.  Patient will receive new monitor in mail on 09/02/14.  Instructed Darleen to apply monitor, pull paper tag off back of monitor to start, initiate a recording and then call [employer] to make sure they received the baseline recording.  Please confirm with [employer] service will be extended 7 additional days since initial monitor out of service.

## 2014-09-01 NOTE — Telephone Encounter (Signed)
[employer] monitor not working since 08/26/14.  [employer] mailing a new monitor to arrive 09/02/14.  Instructed to apply monitor, pull white tab from back of monitor to start, send initial recording, and call [employer] to confirm they received baseline.  Confirm with [employer] at that time service to be extended 7 day that patient was without service.

## 2014-09-07 ENCOUNTER — Other Ambulatory Visit: Payer: Self-pay

## 2014-09-07 MED ORDER — OMEPRAZOLE 20 MG PO CPDR: 20.0000 mg | DELAYED_RELEASE_CAPSULE | Freq: Two times a day (BID) | ORAL | Status: DC

## 2014-09-07 MED ORDER — OXYCODONE-ACETAMINOPHEN 5-325 MG PO TABS: 1.0000 | ORAL_TABLET | Freq: Three times a day (TID) | ORAL | Status: DC | PRN

## 2014-09-07 NOTE — Telephone Encounter (Signed)
[name] Elliott request rx oxycodone apap. Call when ready for pick up. Pt has changed ins co and request refill omeprazole to [facility] (acct set up and advised [name] Elliott done).

## 2014-09-07 NOTE — Telephone Encounter (Signed)
Left message on machine that rx is ready for pick-up, and it will be at our front desk.  

## 2014-09-22 ENCOUNTER — Telehealth: Payer: Self-pay | Admitting: Nurse Practitioner

## 2014-09-22 NOTE — Telephone Encounter (Signed)
Spoke with patient's daughter to report monitor results.  Daughter reports that since patient's first monitor malfunctioned, patient was told that he needs to wear the current monitor until 1/24.  I advised that thus far, monitor shows NSR with PVCs per Dr. Acie Fredrickson.  I advised daughter that I will call back with final report once monitor is discontinued; she verbalized understanding and agreement

## 2014-10-11 ENCOUNTER — Other Ambulatory Visit: Payer: Self-pay

## 2014-10-11 MED ORDER — OXYCODONE-ACETAMINOPHEN 5-325 MG PO TABS: 1.0000 | ORAL_TABLET | Freq: Three times a day (TID) | ORAL | Status: DC | PRN

## 2014-10-11 MED ORDER — ISOSORBIDE MONONITRATE ER 60 MG PO TB24: 60.0000 mg | ORAL_TABLET | Freq: Every morning | ORAL | Status: DC

## 2014-10-11 NOTE — Telephone Encounter (Signed)
[name] pts daughter left v/m requesting rx oxycodone apap to be picked up on 10/12/14. Call when ready for pick up. Pt last seen 07/27/14.Also request refill isosorbide to [facility]; refill done.

## 2014-10-11 NOTE — Telephone Encounter (Signed)
Patient's daughter [name]) notified that script is up front ready for pickup.

## 2014-10-12 ENCOUNTER — Encounter: Payer: Self-pay | Admitting: Internal Medicine

## 2014-10-12 ENCOUNTER — Ambulatory Visit (INDEPENDENT_AMBULATORY_CARE_PROVIDER_SITE_OTHER): Payer: PPO | Admitting: Internal Medicine

## 2014-10-12 VITALS — BP 126/62 | HR 86 | Temp 97.5°F | Wt 153.0 lb

## 2014-10-12 DIAGNOSIS — M25561 Pain in right knee: Secondary | ICD-10-CM

## 2014-10-12 DIAGNOSIS — L989 Disorder of the skin and subcutaneous tissue, unspecified: Secondary | ICD-10-CM

## 2014-10-12 MED ORDER — TRIAMCINOLONE ACETONIDE 0.1 % EX CREA: 1.0000 "application " | TOPICAL_CREAM | Freq: Two times a day (BID) | CUTANEOUS | Status: DC

## 2014-10-12 NOTE — Progress Notes (Signed)
Subjective:    Patient ID: [name] Elliott, male    DOB: [birth year], 81 y.o.   MRN: [id number]  HPI  [name] Elliott is an 81 y.o. male presenting to clinic with his daughter for c/o chronic bilateral lower leg pain. He has red and purple sores on his lower legs with pain most severe on the right.  Pain feels like it's shooting and stabbing his legs. He reports that when he walks, his legs get more swollen and painful. Despite taking lots of pain pills (percocet), the pain will not go away. He takes Neruontin as well but cannot tell that it helps at all. He is concerned about new scaly lesions above the older darker sores that he's had for a couple of years because sores are "growing up his leg" but they don't hurt as much. He has been previously diagnosed with varicose veins of both legs with edema. He does not wear his compression stockings because they hurt his legs. HTN, HL, DM2, CKD, diastolic dysfunction and CAD. Currently wearing a holter monitor for 30 days and returning to cardiologist. He denies chest pain or SOB.    Review of Systems      Past Medical History  Diagnosis Date  . Constipation, chronic   . Sebaceous cyst   . Other malaise and fatigue   . Pain in joint, shoulder region     Has chronic shoulder pain  . Persistent disorder of initiating or maintaining sleep   . Thrombocytopenia, unspecified   . Vascular dementia without behavioral disturbance     with periods of amnesia  . Other B-complex deficiencies   . Hypothyroidism   . Pain in limb   . Esophageal reflux   . Unspecified essential hypertension   . Arthropathy, unspecified, site unspecified   . Pure hypercholesterolemia   . Orthostasis   . PVC's (premature ventricular contractions)   . S/P cardiac cath 08/08/11    mild to moderate CAD primarily in the LAD. None are obstructive and appear stable from prior cath in 2007; managed medically  . Arthritis   . CAD (coronary artery disease)     last cath in 2012.  Managed medically-some blockages  . Headache(784.0)   . 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 MRI Nov '11 - tendonosis and partial tear. Left shoulder surgery Jan '12 for partial shoulder replacement. [name])   . Blood dyscrasia     thromboctopenia  . Pneumonia 1980's?; 2011  . History of stomach ulcers 11/2012  . Chronic lower back pain   . Atrial fibrillation   . Diabetes mellitus without complication   . DVT (deep venous thrombosis)   . Stroke     Current Outpatient Prescriptions  Medication Sig Dispense Refill  . ACCU-CHEK SOFTCLIX LANCETS lancets Use as instructed to test blood sugar once daily dx: 250.42 200 each 1  . aspirin EC 81 MG tablet Take 81 mg by mouth daily.     . Blood Glucose Calibration (ACCU-CHEK AVIVA) SOLN Use to calibrate blood glucose meter dx 250.42 1 each 0  . diclofenac sodium (VOLTAREN) 1 % GEL Apply 1 application topically 3 (three) times daily as needed (knee pain).    . furosemide (LASIX) 40 MG tablet Take 80 mg by mouth daily.    Marland Kitchen gabapentin (NEURONTIN) 300 MG capsule Take 2 capsules (600 mg total) by mouth 3 (three) times daily. 360 capsule 5  . glucose blood (ACCU-CHEK AVIVA PLUS) test strip Use as instructed to test  blood sugar once daily dx: 250.42 200 each 2  . isosorbide mononitrate (IMDUR) 60 MG 24 hr tablet Take 1 tablet (60 mg total) by mouth every morning. 90 tablet 1  . metFORMIN (GLUCOPHAGE) 500 MG tablet Take 1 tablet (500 mg total) by mouth 2 (two) times daily with a meal. 180 tablet 3  . omeprazole (PRILOSEC) 20 MG capsule Take 1 capsule (20 mg total) by mouth 2 (two) times daily before a meal. 180 capsule 2  . oxyCODONE-acetaminophen (PERCOCET/ROXICET) 5-325 MG per tablet Take 1 tablet by mouth 3 (three) times daily as needed for severe pain. 40 tablet 0  . potassium chloride SA (K-DUR,KLOR-CON) 20 MEQ tablet Take 1 tablet (20 mEq total) by mouth 2 (two) times daily. 180 tablet 3  . [DISCONTINUED] Hyoscyamine  Sulfate (HYOMAX-DT) 0.375 MG TBCR Take 1 tab bid for 5 days, then prn abd pain 30 each 1   No current facility-administered medications for this visit.    Allergies  Allergen Reactions  . Crestor [Rosuvastatin Calcium] Other (See Comments)    Muscle pain/aches    Family History  Problem Relation Age of Onset  . Breast cancer Mother   . Cancer Mother   . Diabetes Mother   . Cancer Brother     throat  . Diabetes Daughter   . Colon cancer Neg Hx   . Kidney disease Father   . Diabetes Sister   . Heart attack Neg Hx   . Stroke Neg Hx     History   Social History  . Marital Status: Widowed    Spouse Name: N/A    Number of Children: 2  . Years of Education: N/A   Occupational History  . retired    Social History Main Topics  . Smoking status: Former Smoker    Types: Cigars    Quit date: 09/03/1978  . Smokeless tobacco: Current User    Types: Chew     Comment: 02/17/2013 "I aien't smoked a cigar in 20-30 yr"  . Alcohol Use: No  . Drug Use: No  . Sexual Activity: No   Other Topics Concern  . Not on file   Social History Narrative   2nd grade education. Married '58, '95. 2 dtr '59, '64, youngest daughter died septic kidney.    Lives alone, handles all ADLs      Has living will   Daughter [name] is health care POA   Would accept resuscitation attempts--- but no prolonged ventilation.   Not sure about tube feeds.               Constitutional: Denies fever, chills or abrupt weight changes.  Respiratory: Denies difficulty breathing, shortness of breath or cough. Cardiovascular: Denies chest pain, chest tightness or palpitations. Some swelling in feet. Denies swelling in hands. Musculoskeletal: Positive lower leg muscle pain and swelling. Denies decrease in range of motion, difficulty with gait or joint pain. Skin: Positive redness and lesions. Denies itching, bleeding, or oozing.   Neurological: Denies dizziness or numbness/tingling.  No other specific  complaints in a complete review of systems (except as listed in HPI above).  Objective:   Physical Exam   BP 126/62 mmHg  Pulse 86  Temp(Src) 97.5 F (36.4 C) (Oral)  Wt 153 lb (69.4 kg)  SpO2 95% Wt Readings from Last 3 Encounters:  10/12/14 153 lb (69.4 kg)  08/18/14 155 lb (70.308 kg)  08/09/14 152 lb 12.8 oz (69.31 kg)    General: Appears his stated age, well-developed  and well-nourished, in NAD. Skin: Two 1-2 cm round red scaly lesions on right lower leg; no bleeding, weeping or ulceration; mild tenderness noted. Hyperpigmentation on both medial ankles with right side more pronounced and spread proximally; moderate tenderness to palpation b/l. Cardiovascular: Normal rate and rhythm. S1,S2 noted.  No murmur or gallops noted. BLE edema. Palpable pedal pulses noted. Venous dilation in feet. Pulmonary/Chest: Normal effort and positive vesicular breath sounds. No respiratory distress. No wheezes, rales or ronchi noted.  Neurological: CN II-XII intact, no focal abnormalities noted.   BMET    Component Value Date/Time   NA 137 08/03/2014 0450   K 4.4 08/03/2014 0450   CL 96 08/03/2014 0450   CO2 27 08/03/2014 0450   GLUCOSE 103* 08/03/2014 0450   BUN 25* 08/03/2014 0450   CREATININE 1.43* 08/03/2014 0450   CALCIUM 9.4 08/03/2014 0450   GFRNONAA 43* 08/03/2014 0450   GFRAA 50* 08/03/2014 0450    Lipid Panel     Component Value Date/Time   CHOL 155 08/02/2014 0334   TRIG 46 08/02/2014 0334   HDL 59 08/02/2014 0334   CHOLHDL 2.6 08/02/2014 0334   VLDL 9 08/02/2014 0334   LDLCALC 87 08/02/2014 0334    CBC    Component Value Date/Time   WBC 6.7 08/03/2014 0450   RBC 4.50 08/03/2014 0450   HGB 12.8* 08/03/2014 0450   HCT 38.8* 08/03/2014 0450   PLT 205 08/03/2014 0450   MCV 86.2 08/03/2014 0450   MCH 28.4 08/03/2014 0450   MCHC 33.0 08/03/2014 0450   RDW 13.8 08/03/2014 0450   LYMPHSABS 2.2 08/01/2014 1945   MONOABS 0.5 08/01/2014 1945   EOSABS 0.3 08/01/2014  1945   BASOSABS 0.0 08/01/2014 1945    Hgb A1C Lab Results  Component Value Date   HGBA1C 6.4* 08/02/2014        Assessment & Plan:   Lower leg pain, right:  Continue taking Percocet 5-325 MG PRN  Continue Neurontin- discussed increasing this but he did not want to increase at this time   Skin lesion of right leg:  Triamcinolone cream 0.1%; apply 2 times daily.  F/U with Dr. Silvio Pate

## 2014-10-12 NOTE — Progress Notes (Signed)
Pre visit review using our clinic review tool, if applicable. No additional management support is needed unless otherwise documented below in the visit note. 

## 2014-10-14 ENCOUNTER — Telehealth: Payer: Self-pay | Admitting: Internal Medicine

## 2014-10-14 NOTE — Telephone Encounter (Signed)
Will defer to [facility] about this since she saw it. If steroid cream made it hurt more, it may be best to have him see a dermatologist

## 2014-10-14 NOTE — Telephone Encounter (Signed)
[name] called stating mr mendizabal was in to see regina.  She gave mr Girton a cream to put on his legs  (not sure the name of it) When mr Disbro put this on his legs it made his leg hurt more.   They stop the cream and wanted to know what he needed to do  [facility]

## 2014-10-15 NOTE — Telephone Encounter (Signed)
Agree with Dr. Silvio Pate. We can refer to derm, but I have no other options for him. Let me know if he would like the referral

## 2014-10-19 NOTE — Telephone Encounter (Signed)
Left message on voicemail.

## 2014-10-20 ENCOUNTER — Ambulatory Visit: Payer: Commercial Managed Care - HMO | Admitting: Cardiovascular Disease

## 2014-10-25 ENCOUNTER — Telehealth: Payer: Self-pay

## 2014-10-25 MED ORDER — GABAPENTIN 300 MG PO CAPS: 600.0000 mg | ORAL_CAPSULE | Freq: Three times a day (TID) | ORAL | Status: DC

## 2014-10-25 MED ORDER — NORTRIPTYLINE HCL 25 MG PO CAPS: 25.0000 mg | ORAL_CAPSULE | Freq: Every day | ORAL | Status: DC

## 2014-10-25 NOTE — Telephone Encounter (Signed)
[name] Elliott pts daughter left v/m; pt last seen 10/12/14; pts leg pain is no better and pain now is almost unbearable; [name] Elliott wants to know what else can be done such as pain mgt. Or what to do; [name] Elliott request cb. Also request refill gabapentin to [facility].Please advise.

## 2014-10-25 NOTE — Telephone Encounter (Signed)
Called and notified patient as instructed by telephone. Patient requested that his daughter be notified by telephone. Advised [name] Elliott as instructed by telephone and verbalized understanding. [name] Elliott stated that something has to be done regarding her dad's leg pain and requested that he try Nortriptyline and see how he does with it. Follow-up appointment scheduled as instructed. Gabapentin prescription sent to pharmacy and Nortriptyline called to pharmacy.

## 2014-10-25 NOTE — Telephone Encounter (Signed)
Okay to send the gabapentin Rx  This is a tough situation--- I am okay with referral to pain specialist but this isn't the type of pain they mostly handle He is already on a fairly high dose of the gabapentin, so i would recommend starting a new medication at bedtime in addition to the gabapentin. If he is willing to try, send rx for nortriptyline 25mg  at bedtime (#30 x 1) He needs to watch for dry mouth, constipation or confusion Set up follow up with me in 2-3 weeks to see if it is helping We can discuss referral at that time also

## 2014-10-28 ENCOUNTER — Ambulatory Visit: Payer: PPO | Admitting: Cardiovascular Disease

## 2014-11-01 ENCOUNTER — Telehealth: Payer: Self-pay | Admitting: Internal Medicine

## 2014-11-01 NOTE — Telephone Encounter (Signed)
Patient's daughter called and said patient's legs are getting worse.  Patient's right leg has sores on the leg.  The legs are red and have purple blotches.  The new medication hasn't helped.  Patient's daughter wanted to bring him in to see you on Thursday, but you just have same day appointments. Can patient be seen on Thursday?

## 2014-11-01 NOTE — Telephone Encounter (Signed)
Left message for [name] Elliott to call office and schedule an appointment.

## 2014-11-01 NOTE — Telephone Encounter (Signed)
Yes. Are you sure she doesn't want to bring him in tomorrow--- at end of my schedule?

## 2014-11-02 ENCOUNTER — Ambulatory Visit (INDEPENDENT_AMBULATORY_CARE_PROVIDER_SITE_OTHER): Payer: PPO | Admitting: Cardiovascular Disease

## 2014-11-02 ENCOUNTER — Encounter: Payer: Self-pay | Admitting: Cardiovascular Disease

## 2014-11-02 VITALS — BP 130/62 | HR 66 | Ht 62.0 in | Wt 156.8 lb

## 2014-11-02 DIAGNOSIS — I10 Essential (primary) hypertension: Secondary | ICD-10-CM

## 2014-11-02 DIAGNOSIS — M79605 Pain in left leg: Secondary | ICD-10-CM

## 2014-11-02 DIAGNOSIS — I2510 Atherosclerotic heart disease of native coronary artery without angina pectoris: Secondary | ICD-10-CM

## 2014-11-02 DIAGNOSIS — M79604 Pain in right leg: Secondary | ICD-10-CM

## 2014-11-02 NOTE — Progress Notes (Signed)
Cardiology Office Note   Date:  11/02/2014   ID:  [name] Elliott, DOB 1928-04-12, MRN [id number]  PCP:  Viviana Simpler, MD  Cardiologist:   Acie Fredrickson Wonda Cheng, MD   Chief Complaint  Patient presents with  . Follow-up    CAD   Problem list:  1. Moderate coronary artery disease 2. Dementia 3. Peripheral vascular disease 4. CVA 5.   History of Present Illness:  81 year old gentleman with a history of moderate diffuse coronary artery disease. We treated medically. The left anterior descending arteries/ 1st diagonal stenosis has not was not suitable for PCI.  He complains of being sick in general is weak sensation. He also complains of some left arm pain. When I saw him last month. He was having some episodes of chest pain. We were trying to decide whether or not these were due to angina. I asked him to take nitroglycerin when he had these episodes of pain. At times the nitroglycerin helps him and at other times the nitroglycerin did not do anything. When he walks up a hill in his backyard he has significant shortness breath and this "sick feeling".  He has had a partial shoulder replacement for a shoulder fracture. He has had chronic shoulder pain since that time.  He has chronic knee pain and is considering knee surgery. His office visit was for the purpose of preoperative evaluation.  Sept. 17, 2013- He has no cardiac complaints. He denies any chest pain or dyspnea. He still has some left shoulder stiffness from his surgery February 05, 2012. He has had some wheezing and was recently started on steroids and an antibiotic.  He also ws diagnosed with peripheral neurophy He was prescribed gabapentin but his daughter did not fill the medication because it was too expensive.  She bought some over-the-counter vitamin B12 which seems to be helping a little bit.  November 26, 2012:  Makhai was recently hospitalized last week for peptic ulcer Disease. He had vomiting of coffee-ground  emesis. He also had blood in his stool. [facility] discontinued his Gabriel Earing powders and his Fosamax. He seems to be doing well from a cardiac standpoint. No angina.  He is having trouble with leg pain.   Jan. 20, 2015:  No CP. He has lots of fatigue especially if he tries to walk any distance.  April 21, 2014,   November 02, 2014;  [name] Elliott is a 81 y.o. male who presents for his coronary artery disease.   He was seen by Richardson Dopp several weeks ago. He had a stroke back in November. We've placed a event monitor looking for atrial ablation. There was no evidence of atrial fibrillation on the monitor. There is some mention of atrial fibrillation in past charts but we've not been able to locate any confirmation of that.  He complains of leg pain today.   He keeps his legs elevated 3-4 hours a day.  Has seen [facility] and they had no solutions for his leg pain.    No additional stroke symptoms.  He was seen with his granddaughter this am.   Past Medical History  Diagnosis Date  . Constipation, chronic   . Sebaceous cyst   . Other malaise and fatigue   . Pain in joint, shoulder region     Has chronic shoulder pain  . Persistent disorder of initiating or maintaining sleep   . Thrombocytopenia, unspecified   . Vascular dementia without behavioral disturbance     with periods of amnesia  .  Other B-complex deficiencies   . Hypothyroidism   . Pain in limb   . Esophageal reflux   . Unspecified essential hypertension   . Arthropathy, unspecified, site unspecified   . Pure hypercholesterolemia   . Orthostasis   . PVC's (premature ventricular contractions)   . S/P cardiac cath 08/08/11    mild to moderate CAD primarily in the LAD. None are obstructive and appear stable from prior cath in 2007; managed medically  . Arthritis   . CAD (coronary artery disease)     last cath in 2012. Managed medically-some blockages  . Headache(784.0)   . 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 MRI Nov '11 - tendonosis and partial tear. Left shoulder surgery Jan '12 for partial shoulder replacement. [name])   . Blood dyscrasia     thromboctopenia  . Pneumonia 1980's?; 2011  . History of stomach ulcers 11/2012  . Chronic lower back pain   . Atrial fibrillation   . Diabetes mellitus without complication   . DVT (deep venous thrombosis)   . Stroke     Past Surgical History  Procedure Laterality Date  . Hand surgery Right     crush injury, right fifth digit contracture, limited  motion  . Lumbar spine surgery  2007    Dr  Aspen ([place])  . Knee surgery Left 1991    "did it twice in 1 wk" (02/17/2013)  . Cataract extraction Left 2009  . Shoulder open rotator cuff repair Left 8.30.2011  . US echocardiography  02-28-2010    Est EF 50-55%  . Cardiac catheterization  08/2011  . Shoulder hemi-arthroplasty    . Finger amputation Right     pinky finger  . Hardware removal  02/05/2012    Procedure: HARDWARE REMOVAL;  Surgeon: Johnny Bridge, MD;  Location: [facility];  Service: Orthopedics;  Laterality: Left;  . Reverse shoulder arthroplasty  02/05/2012    Procedure: REVERSE SHOULDER ARTHROPLASTY;  Surgeon: Johnny Bridge, MD;  Location: [facility];  Service: Orthopedics;  Laterality: Left;  . Back surgery    . Esophagogastroduodenoscopy N/A 11/19/2012    Procedure: ESOPHAGOGASTRODUODENOSCOPY (EGD);  Surgeon: Jerene Bears, MD;  Location: [facility];  Service: Gastroenterology;  Laterality: N/A;     Current Outpatient Prescriptions  Medication Sig Dispense Refill  . ACCU-CHEK SOFTCLIX LANCETS lancets Use as instructed to test blood sugar once daily dx: 250.42 200 each 1  . aspirin EC 81 MG tablet Take 81 mg by mouth daily.     . Blood Glucose Calibration (ACCU-CHEK AVIVA) SOLN Use to calibrate blood glucose meter dx 250.42 1 each 0  . diclofenac sodium (VOLTAREN) 1 % GEL Apply 1 application topically 3 (three) times daily as needed (knee pain).    . furosemide  (LASIX) 40 MG tablet Take 80 mg by mouth daily.    Marland Kitchen gabapentin (NEURONTIN) 300 MG capsule Take 2 capsules (600 mg total) by mouth 3 (three) times daily. 540 capsule 1  . glucose blood (ACCU-CHEK AVIVA PLUS) test strip Use as instructed to test blood sugar once daily dx: 250.42 200 each 2  . isosorbide mononitrate (IMDUR) 60 MG 24 hr tablet Take 1 tablet (60 mg total) by mouth every morning. 90 tablet 1  . metFORMIN (GLUCOPHAGE) 500 MG tablet Take 1 tablet (500 mg total) by mouth 2 (two) times daily with a meal. 180 tablet 3  . nortriptyline (PAMELOR) 25 MG capsule Take 1 capsule (25 mg total) by mouth at bedtime. [place]  capsule 1  . omeprazole (PRILOSEC) 20 MG capsule Take 1 capsule (20 mg total) by mouth 2 (two) times daily before a meal. 180 capsule 2  . oxyCODONE-acetaminophen (PERCOCET/ROXICET) 5-325 MG per tablet Take 1 tablet by mouth 3 (three) times daily as needed for severe pain. 40 tablet 0  . potassium chloride SA (K-DUR,KLOR-CON) 20 MEQ tablet Take 1 tablet (20 mEq total) by mouth 2 (two) times daily. 180 tablet 3  . triamcinolone cream (KENALOG) 0.1 % Apply 1 application topically 2 (two) times daily. 30 g 0  . [DISCONTINUED] Hyoscyamine Sulfate (HYOMAX-DT) 0.375 MG TBCR Take 1 tab bid for 5 days, then prn abd pain 30 each 1   No current facility-administered medications for this visit.    Allergies:   Crestor    Social History:  The patient  reports that he quit smoking about 36 years ago. His smoking use included Cigars. His smokeless tobacco use includes Chew. He reports that he does not drink alcohol or use illicit drugs.   Family History:  The patient's family history includes Breast cancer in his mother; Cancer in his brother and mother; Diabetes in his daughter, mother, and sister; Kidney disease in his father. There is no history of Colon cancer, Heart attack, or Stroke.    ROS:  Please see the history of present illness.    Review of Systems: Constitutional:  denies  fever, chills, diaphoresis, appetite change and fatigue.  HEENT: denies photophobia, eye pain, redness, hearing loss, ear pain, congestion, sore throat, rhinorrhea, sneezing, neck pain, neck stiffness and tinnitus.  Respiratory: denies SOB, DOE, cough, chest tightness, and wheezing.  Cardiovascular: denies chest pain, palpitations and leg swelling.  Gastrointestinal: denies nausea, vomiting, abdominal pain, diarrhea, constipation, blood in stool.  Genitourinary: denies dysuria, urgency, frequency, hematuria, flank pain and difficulty urinating.  Musculoskeletal: admits to  Leg pain    Skin: admits to  Wound on his legs   Neurological: denies dizziness, seizures, syncope, weakness, light-headedness, numbness and headaches.   Hematological: denies adenopathy, easy bruising, personal or family bleeding history.  Psychiatric/ Behavioral: denies suicidal ideation, mood changes, confusion, nervousness, sleep disturbance and agitation.       All other systems are reviewed and negative.    PHYSICAL EXAM: VS:  BP 130/62 mmHg  Pulse 66  Ht 5\' 2"  (1.575 m)  Wt 156 lb 12.8 oz (71.124 kg)  BMI 28.67 kg/m2  SpO2 98% , BMI Body mass index is 28.67 kg/(m^2). GEN: Well nourished, well developed, in no acute distress HEENT: normal Neck: no JVD, carotid bruits, or masses Cardiac: RRR; soft systolic murmur  rubs, or gallops, 1+ bilateral leg edema  Respiratory:  clear to auscultation bilaterally, normal work of breathing GI: soft, nontender, nondistended, + BS MS: legs have hair loss.  Appear to have chronic ischemic changes.  Several sores. 1 + leg edema  Skin: warm and dry, no rash Neuro:  Moves slow.  Was able to get up on the table himself Psych: ? Mild dementia , slow speech    EKG:  EKG is not ordered today. The ekg ordered today demonstrates    Recent Labs: 12/21/2013: TSH 2.82 08/01/2014: ALT 10 08/03/2014: BUN 25*; Creatinine 1.43*; Hemoglobin 12.8*; Platelets 205; Potassium 4.4;  Sodium 137    Lipid Panel    Component Value Date/Time   CHOL 155 08/02/2014 0334   TRIG 46 08/02/2014 0334   HDL 59 08/02/2014 0334   CHOLHDL 2.6 08/02/2014 0334   VLDL 9 08/02/2014 0334  LDLCALC 87 08/02/2014 0334   LDLDIRECT 140.6 09/25/2013 1030      Wt Readings from Last 3 Encounters:  11/02/14 156 lb 12.8 oz (71.124 kg)  10/12/14 153 lb (69.4 kg)  08/18/14 155 lb (70.308 kg)      Other studies Reviewed: Additional studies/ records that were reviewed today include: . Review of the above records demonstrates:    ASSESSMENT AND PLAN:  1. Moderate coronary artery disease - he's not having any episodes of angina.  2. Dementia  3. Peripheral vascular disease- he has chronic leg pain. He has seen Dr. Trula Slade and there is not much more that can be done for his leg pain.   4. CVA - he's not had any recurrent strokes. There was no evidence of CVA on the 30 day event monitor. We'll continue to follow.   Current medicines are reviewed at length with the patient today.  The patient does not have concerns regarding medicines.  The following changes have been made:  no change   Disposition:   FU with me in 3 months     Signed, Sephira Zellman, Wonda Cheng, MD  11/02/2014 8:59 AM    [facility] [address], [place], [place]  [address] Phone: [phone]; Fax: [phone]

## 2014-11-02 NOTE — Patient Instructions (Signed)
Your physician recommends that you continue on your current medications as directed. Please refer to the Current Medication list given to you today.  Your physician wants you to follow-up in: 6 months with Dr. Nahser.  You will receive a reminder letter in the mail two months in advance. If you don't receive a letter, please call our office to schedule the follow-up appointment.  

## 2014-11-03 ENCOUNTER — Ambulatory Visit: Payer: PPO | Admitting: Cardiovascular Disease

## 2014-11-04 ENCOUNTER — Encounter: Payer: Self-pay | Admitting: Internal Medicine

## 2014-11-04 ENCOUNTER — Ambulatory Visit (INDEPENDENT_AMBULATORY_CARE_PROVIDER_SITE_OTHER): Payer: PPO | Admitting: Internal Medicine

## 2014-11-04 ENCOUNTER — Telehealth: Payer: Self-pay | Admitting: Internal Medicine

## 2014-11-04 VITALS — BP 120/72 | HR 80 | Temp 97.6°F | Wt 155.1 lb

## 2014-11-04 DIAGNOSIS — I809 Phlebitis and thrombophlebitis of unspecified site: Secondary | ICD-10-CM | POA: Insufficient documentation

## 2014-11-04 MED ORDER — CEPHALEXIN 500 MG PO CAPS: 500.0000 mg | ORAL_CAPSULE | Freq: Three times a day (TID) | ORAL | Status: DC

## 2014-11-04 NOTE — Telephone Encounter (Signed)
Informed [name] Elliott of Dr Silvio Pate recommendations.  She questioned what else can be done, advised her that Dr Silvio Pate wants to see what happens as the infection gets better.  [name] Elliott said "thank you, bye" and hung up the phone.

## 2014-11-04 NOTE — Assessment & Plan Note (Signed)
Complicating chronic venous stasis Discussed using warm compresses Cephalexin x 1 week

## 2014-11-04 NOTE — Progress Notes (Signed)
Subjective:    Patient ID: [name] Elliott, male    DOB: 24-Jun-1928, 81 y.o.   MRN: [id number]  HPI Here with granddaughter  Still having pain in legs and chronic edema Vascular surgeon doesn't feel procedure will help UTD with cardiologist--on diuretic  He has had some redness and inflammation on lower right calf Has progressed since visit 3 weeks ago No weeping or discharge  Current Outpatient Prescriptions on File Prior to Visit  Medication Sig Dispense Refill  . ACCU-CHEK SOFTCLIX LANCETS lancets Use as instructed to test blood sugar once daily dx: 250.42 200 each 1  . aspirin EC 81 MG tablet Take 81 mg by mouth daily.     . Blood Glucose Calibration (ACCU-CHEK AVIVA) SOLN Use to calibrate blood glucose meter dx 250.42 1 each 0  . diclofenac sodium (VOLTAREN) 1 % GEL Apply 1 application topically 3 (three) times daily as needed (knee pain).    . furosemide (LASIX) 40 MG tablet Take 80 mg by mouth daily.    Marland Kitchen gabapentin (NEURONTIN) 300 MG capsule Take 2 capsules (600 mg total) by mouth 3 (three) times daily. 540 capsule 1  . glucose blood (ACCU-CHEK AVIVA PLUS) test strip Use as instructed to test blood sugar once daily dx: 250.42 200 each 2  . isosorbide mononitrate (IMDUR) 60 MG 24 hr tablet Take 1 tablet (60 mg total) by mouth every morning. 90 tablet 1  . metFORMIN (GLUCOPHAGE) 500 MG tablet Take 1 tablet (500 mg total) by mouth 2 (two) times daily with a meal. 180 tablet 3  . nortriptyline (PAMELOR) 25 MG capsule Take 1 capsule (25 mg total) by mouth at bedtime. 30 capsule 1  . omeprazole (PRILOSEC) 20 MG capsule Take 1 capsule (20 mg total) by mouth 2 (two) times daily before a meal. 180 capsule 2  . oxyCODONE-acetaminophen (PERCOCET/ROXICET) 5-325 MG per tablet Take 1 tablet by mouth 3 (three) times daily as needed for severe pain. 40 tablet 0  . potassium chloride SA (K-DUR,KLOR-CON) 20 MEQ tablet Take 1 tablet (20 mEq total) by mouth 2 (two) times daily. 180 tablet 3  .  triamcinolone cream (KENALOG) 0.1 % Apply 1 application topically 2 (two) times daily. 30 g 0  . [DISCONTINUED] Hyoscyamine Sulfate (HYOMAX-DT) 0.375 MG TBCR Take 1 tab bid for 5 days, then prn abd pain 30 each 1   No current facility-administered medications on file prior to visit.    Allergies  Allergen Reactions  . Crestor [Rosuvastatin Calcium] Other (See Comments)    Muscle pain/aches    Past Medical History  Diagnosis Date  . Constipation, chronic   . Sebaceous cyst   . Other malaise and fatigue   . Pain in joint, shoulder region     Has chronic shoulder pain  . Persistent disorder of initiating or maintaining sleep   . Thrombocytopenia, unspecified   . Vascular dementia without behavioral disturbance     with periods of amnesia  . Other B-complex deficiencies   . Hypothyroidism   . Pain in limb   . Esophageal reflux   . Unspecified essential hypertension   . Arthropathy, unspecified, site unspecified   . Pure hypercholesterolemia   . Orthostasis   . PVC's (premature ventricular contractions)   . S/P cardiac cath 08/08/11    mild to moderate CAD primarily in the LAD. None are obstructive and appear stable from prior cath in 2007; managed medically  . Arthritis   . CAD (coronary artery disease)  last cath in 2012. Managed medically-some blockages  . Headache(784.0)   . 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 MRI Nov '11 - tendonosis and partial tear. Left shoulder surgery Jan '12 for partial shoulder replacement. [name])   . Blood dyscrasia     thromboctopenia  . Pneumonia 1980's?; 2011  . History of stomach ulcers 11/2012  . Chronic lower back pain   . Atrial fibrillation   . Diabetes mellitus without complication   . DVT (deep venous thrombosis)   . Stroke     Past Surgical History  Procedure Laterality Date  . Hand surgery Right     crush injury, right fifth digit contracture, limited  motion  . Lumbar spine surgery  2007     Dr Philip Aspen ([place])  . Knee surgery Left 1991    "did it twice in 1 wk" (02/17/2013)  . Cataract extraction Left 2009  . Shoulder open rotator cuff repair Left 8.30.2011  . US echocardiography  02-28-2010    Est EF 50-55%  . Cardiac catheterization  08/2011  . Shoulder hemi-arthroplasty    . Finger amputation Right     pinky finger  . Hardware removal  02/05/2012    Procedure: HARDWARE REMOVAL;  Surgeon: Johnny Bridge, MD;  Location: [facility];  Service: Orthopedics;  Laterality: Left;  . Reverse shoulder arthroplasty  02/05/2012    Procedure: REVERSE SHOULDER ARTHROPLASTY;  Surgeon: Johnny Bridge, MD;  Location: [facility];  Service: Orthopedics;  Laterality: Left;  . Back surgery    . Esophagogastroduodenoscopy N/A 11/19/2012    Procedure: ESOPHAGOGASTRODUODENOSCOPY (EGD);  Surgeon: Jerene Bears, MD;  Location: [facility];  Service: Gastroenterology;  Laterality: N/A;    Family History  Problem Relation Age of Onset  . Breast cancer Mother   . Cancer Mother   . Diabetes Mother   . Cancer Brother     throat  . Diabetes Daughter   . Colon cancer Neg Hx   . Kidney disease Father   . Diabetes Sister   . Heart attack Neg Hx   . Stroke Neg Hx     History   Social History  . Marital Status: Widowed    Spouse Name: N/A  . Number of Children: 2  . Years of Education: N/A   Occupational History  . retired    Social History Main Topics  . Smoking status: Former Smoker    Types: Cigars    Quit date: 09/03/1978  . Smokeless tobacco: Current User    Types: Chew     Comment: 02/17/2013 "I aien't smoked a cigar in 20-30 yr"  . Alcohol Use: No  . Drug Use: No  . Sexual Activity: No   Other Topics Concern  . Not on file   Social History Narrative   2nd grade education. Married '58, '95. 2 dtr '59, '64, youngest daughter died septic kidney.    Lives alone, handles all ADLs      Has living will   Daughter [name] is health care POA   Would accept resuscitation attempts---  but no prolonged ventilation.   Not sure about tube feeds.               Review of Systems No fever Appetite is okay    Objective:   Physical Exam  Constitutional: No distress.  Musculoskeletal:  1+ non pitting edema--more on left Stasis changes both calves Posterior left calf has redder area and tenderness over superficial dilated veins there  Assessment & Plan:

## 2014-11-04 NOTE — Progress Notes (Signed)
Pre visit review using our clinic review tool, if applicable. No additional management support is needed unless otherwise documented below in the visit note. 

## 2014-11-04 NOTE — Telephone Encounter (Signed)
Please call her His diabetes control is fine--- we can send him to an endocrinologist but they don't necessarily have any other Rx for his neuropathy. He did have an infection in the veins causing the worsening problems with pain--lets see if that helps him before we consider anything else  There is no indication for an MRI of his legs----this is not even a consideration  Refer to Vincente Liberty if she is impolite

## 2014-11-04 NOTE — Patient Instructions (Signed)
Please try warm compresses over the tender spot in your left calf

## 2014-11-04 NOTE — Telephone Encounter (Signed)
Daughter [name] called loudly asking to speak to Dr. Alla German nurse, now.  I explained that Dr. Silvio Pate and CMA actively seeing patients, could I help.  She wants a referral to a "diabetic specialist since that is his leg problem".  Also she wants an "MRI of legs".  I will call pt's daughter  with endo referral and to schedule any test Dr. Silvio Pate might order.  Best number to reach daughter is [phone] / lt

## 2014-11-16 ENCOUNTER — Ambulatory Visit: Payer: PPO | Admitting: Internal Medicine

## 2014-12-12 ENCOUNTER — Emergency Department (HOSPITAL_COMMUNITY)
Admission: EM | Admit: 2014-12-12 | Discharge: 2014-12-12 | Disposition: A | Discharge status: Home / Self Care | Payer: PPO | Attending: Emergency Medicine | Admitting: Emergency Medicine

## 2014-12-12 ENCOUNTER — Encounter (HOSPITAL_COMMUNITY): Payer: Self-pay

## 2014-12-12 DIAGNOSIS — Z86718 Personal history of other venous thrombosis and embolism: Secondary | ICD-10-CM | POA: Diagnosis not present

## 2014-12-12 DIAGNOSIS — M1990 Unspecified osteoarthritis, unspecified site: Secondary | ICD-10-CM | POA: Insufficient documentation

## 2014-12-12 DIAGNOSIS — K219 Gastro-esophageal reflux disease without esophagitis: Secondary | ICD-10-CM | POA: Diagnosis not present

## 2014-12-12 DIAGNOSIS — Z7982 Long term (current) use of aspirin: Secondary | ICD-10-CM | POA: Diagnosis not present

## 2014-12-12 DIAGNOSIS — I4891 Unspecified atrial fibrillation: Secondary | ICD-10-CM | POA: Insufficient documentation

## 2014-12-12 DIAGNOSIS — Z87891 Personal history of nicotine dependence: Secondary | ICD-10-CM | POA: Diagnosis not present

## 2014-12-12 DIAGNOSIS — Z862 Personal history of diseases of the blood and blood-forming organs and certain disorders involving the immune mechanism: Secondary | ICD-10-CM | POA: Insufficient documentation

## 2014-12-12 DIAGNOSIS — Z8673 Personal history of transient ischemic attack (TIA), and cerebral infarction without residual deficits: Secondary | ICD-10-CM | POA: Diagnosis not present

## 2014-12-12 DIAGNOSIS — Z9889 Other specified postprocedural states: Secondary | ICD-10-CM | POA: Diagnosis not present

## 2014-12-12 DIAGNOSIS — G8929 Other chronic pain: Secondary | ICD-10-CM | POA: Diagnosis not present

## 2014-12-12 DIAGNOSIS — I2510 Atherosclerotic heart disease of native coronary artery without angina pectoris: Secondary | ICD-10-CM | POA: Insufficient documentation

## 2014-12-12 DIAGNOSIS — E119 Type 2 diabetes mellitus without complications: Secondary | ICD-10-CM | POA: Diagnosis not present

## 2014-12-12 DIAGNOSIS — M79671 Pain in right foot: Secondary | ICD-10-CM | POA: Diagnosis present

## 2014-12-12 DIAGNOSIS — Z79899 Other long term (current) drug therapy: Secondary | ICD-10-CM | POA: Insufficient documentation

## 2014-12-12 DIAGNOSIS — I10 Essential (primary) hypertension: Secondary | ICD-10-CM | POA: Insufficient documentation

## 2014-12-12 DIAGNOSIS — L03115 Cellulitis of right lower limb: Secondary | ICD-10-CM

## 2014-12-12 LAB — BASIC METABOLIC PANEL
Anion gap: 10 (ref 5–15)
BUN: 25 mg/dL — ABNORMAL HIGH (ref 6–23)
CHLORIDE: 96 mmol/L (ref 96–112)
CO2: 27 mmol/L (ref 19–32)
Calcium: 9.1 mg/dL (ref 8.4–10.5)
Creatinine, Ser: 1.67 mg/dL — ABNORMAL HIGH (ref 0.50–1.35)
GFR calc Af Amer: 42 mL/min — ABNORMAL LOW (ref >90)
GFR calc non Af Amer: 36 mL/min — ABNORMAL LOW (ref >90)
Glucose, Bld: 99 mg/dL (ref 70–99)
Potassium: 4.2 mmol/L (ref 3.5–5.1)
Sodium: 133 mmol/L — ABNORMAL LOW (ref 135–145)

## 2014-12-12 LAB — CBC WITH DIFFERENTIAL/PLATELET
BASOS PCT: 1 % (ref 0–1)
Basophils Absolute: 0 10*3/uL (ref 0.0–0.1)
EOS ABS: 0.2 10*3/uL (ref 0.0–0.7)
Eosinophils Relative: 3 % (ref 0–5)
HEMATOCRIT: 39.5 % (ref 39.0–52.0)
HEMOGLOBIN: 13.2 g/dL (ref 13.0–17.0)
LYMPHS ABS: 2.1 10*3/uL (ref 0.7–4.0)
LYMPHS PCT: 32 % (ref 12–46)
MCH: 28.8 pg (ref 26.0–34.0)
MCHC: 33.4 g/dL (ref 30.0–36.0)
MCV: 86.1 fL (ref 78.0–100.0)
MONO ABS: 0.8 10*3/uL (ref 0.1–1.0)
Monocytes Relative: 12 % (ref 3–12)
Neutro Abs: 3.4 10*3/uL (ref 1.7–7.7)
Neutrophils Relative %: 52 % (ref 43–77)
Platelets: 241 10*3/uL (ref 150–400)
RBC: 4.59 MIL/uL (ref 4.22–5.81)
RDW: 14.1 % (ref 11.5–15.5)
WBC: 6.5 10*3/uL (ref 4.0–10.5)

## 2014-12-12 LAB — URIC ACID: URIC ACID, SERUM: 5.4 mg/dL (ref 4.0–7.8)

## 2014-12-12 MED ORDER — OXYCODONE HCL 10 MG PO TABS: 5.0000 mg | ORAL_TABLET | Freq: Four times a day (QID) | ORAL | Status: DC | PRN

## 2014-12-12 MED ORDER — OXYCODONE HCL 5 MG PO TABS
10.0000 mg | ORAL_TABLET | Freq: Once | ORAL | Status: AC
  Administered 2014-12-12: 10 mg via ORAL
  Filled 2014-12-12: qty 2

## 2014-12-12 MED ORDER — CEPHALEXIN 250 MG PO CAPS: 500.0000 mg | ORAL_CAPSULE | Freq: Once | ORAL | Status: DC

## 2014-12-12 MED ORDER — CEPHALEXIN 500 MG PO CAPS: 500.0000 mg | ORAL_CAPSULE | Freq: Three times a day (TID) | ORAL | Status: DC

## 2014-12-12 NOTE — ED Notes (Signed)
Pt here for pain to his right foot for the past two weeks. No known injury that he is aware of. Right lower leg is swollen and red.

## 2014-12-12 NOTE — ED Provider Notes (Signed)
CSN: [id number]     Arrival date & time 12/12/14  1252 History   First MD Initiated Contact with Patient 12/12/14 1306     Chief Complaint  Patient presents with  . Foot Pain      HPI  Patient resides for evaluation of leg pain. He has had leg pain and areas of redness for months. He states that he has worsening pain in his right leg for the past 2-3 weeks. Family noted area of redness adjacent his right ankle and he presents here.  Has had discoloration of the skin consistent with venous stasis for "years". His never had DVT or PE. No fevers or chills. No history of gout.  Past Medical History  Diagnosis Date  . Constipation, chronic   . Sebaceous cyst   . Other malaise and fatigue   . Pain in joint, shoulder region     Has chronic shoulder pain  . Persistent disorder of initiating or maintaining sleep   . Thrombocytopenia, unspecified   . Vascular dementia without behavioral disturbance     with periods of amnesia  . Other B-complex deficiencies   . Hypothyroidism   . Pain in limb   . Esophageal reflux   . Unspecified essential hypertension   . Arthropathy, unspecified, site unspecified   . Pure hypercholesterolemia   . Orthostasis   . PVC's (premature ventricular contractions)   . S/P cardiac cath 08/08/11    mild to moderate CAD primarily in the LAD. None are obstructive and appear stable from prior cath in 2007; managed medically  . Arthritis   . CAD (coronary artery disease)     last cath in 2012. Managed medically-some blockages  . Headache(784.0)   . 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 MRI Nov '11 - tendonosis and partial tear. Left shoulder surgery Jan '12 for partial shoulder replacement. [name])   . Blood dyscrasia     thromboctopenia  . Pneumonia 1980's?; 2011  . History of stomach ulcers 11/2012  . Chronic lower back pain   . Atrial fibrillation   . Diabetes mellitus without complication   . DVT (deep venous thrombosis)     . Stroke    Past Surgical History  Procedure Laterality Date  . Hand surgery Right     crush injury, right fifth digit contracture, limited  motion  . Lumbar spine surgery  2007    Dr Philip Aspen ([place])  . Knee surgery Left 1991    "did it twice in 1 wk" (02/17/2013)  . Cataract extraction Left 2009  . Shoulder open rotator cuff repair Left 8.30.2011  . US echocardiography  02-28-2010    Est EF 50-55%  . Cardiac catheterization  08/2011  . Shoulder hemi-arthroplasty    . Finger amputation Right     pinky finger  . Hardware removal  02/05/2012    Procedure: HARDWARE REMOVAL;  Surgeon: Johnny Bridge, MD;  Location: [facility];  Service: Orthopedics;  Laterality: Left;  . Reverse shoulder arthroplasty  02/05/2012    Procedure: REVERSE SHOULDER ARTHROPLASTY;  Surgeon: Johnny Bridge, MD;  Location: [facility];  Service: Orthopedics;  Laterality: Left;  . Back surgery    . Esophagogastroduodenoscopy N/A 11/19/2012    Procedure: ESOPHAGOGASTRODUODENOSCOPY (EGD);  Surgeon: Jerene Bears, MD;  Location: [facility];  Service: Gastroenterology;  Laterality: N/A;   Family History  Problem Relation Age of Onset  . Breast cancer Mother   . Cancer Mother   . Diabetes Mother   .  Cancer Brother     throat  . Stroke Brother   . Diabetes Daughter   . Colon cancer Neg Hx   . Heart attack Neg Hx   . Kidney disease Father   . Diabetes Sister    History  Substance Use Topics  . Smoking status: Former Smoker    Types: Cigars    Quit date: 09/03/1978  . Smokeless tobacco: Current User    Types: Chew     Comment: 02/17/2013 "I aien't smoked a cigar in 20-30 yr"  . Alcohol Use: No    Review of Systems  Constitutional: Negative for fever, chills, diaphoresis, appetite change and fatigue.  HENT: Negative for mouth sores, sore throat and trouble swallowing.   Eyes: Negative for visual disturbance.  Respiratory: Negative for cough, chest tightness, shortness of breath and wheezing.    Cardiovascular: Negative for chest pain.  Gastrointestinal: Negative for nausea, vomiting, abdominal pain, diarrhea and abdominal distention.  Endocrine: Negative for polydipsia, polyphagia and polyuria.  Genitourinary: Negative for dysuria, frequency and hematuria.  Musculoskeletal: Negative for gait problem.       Leg pain  Skin: Positive for color change. Negative for pallor.  Neurological: Negative for dizziness, syncope, light-headedness and headaches.  Hematological: Does not bruise/bleed easily.  Psychiatric/Behavioral: Negative for behavioral problems and confusion.      Allergies  Crestor  Home Medications   Prior to Admission medications   Medication Sig Start Date End Date Taking? Authorizing Provider  ACCU-CHEK SOFTCLIX LANCETS lancets Use as instructed to test blood sugar once daily dx: 250.42 02/19/14   Venia Carbon, MD  aspirin EC 81 MG tablet Take 81 mg by mouth daily.     Historical Provider, MD  Blood Glucose Calibration (ACCU-CHEK AVIVA) SOLN Use to calibrate blood glucose meter dx 250.42 02/19/14   Venia Carbon, MD  cephALEXin (KEFLEX) 500 MG capsule Take 1 capsule (500 mg total) by mouth 3 (three) times daily. 12/12/14   Tanna Furry, MD  diclofenac sodium (VOLTAREN) 1 % GEL Apply 1 application topically 3 (three) times daily as needed (knee pain).    Historical Provider, MD  furosemide (LASIX) 40 MG tablet Take 80 mg by mouth daily.    Historical Provider, MD  gabapentin (NEURONTIN) 300 MG capsule Take 2 capsules (600 mg total) by mouth 3 (three) times daily. 10/25/14   Venia Carbon, MD  glucose blood (ACCU-CHEK AVIVA PLUS) test strip Use as instructed to test blood sugar once daily dx: 250.42 02/19/14   Venia Carbon, MD  isosorbide mononitrate (IMDUR) 60 MG 24 hr tablet Take 1 tablet (60 mg total) by mouth every morning. 10/11/14   Venia Carbon, MD  metFORMIN (GLUCOPHAGE) 500 MG tablet Take 1 tablet (500 mg total) by mouth 2 (two) times daily with a  meal. 05/24/14   Venia Carbon, MD  omeprazole (PRILOSEC) 20 MG capsule Take 1 capsule (20 mg total) by mouth 2 (two) times daily before a meal. 09/07/14   Venia Carbon, MD  oxyCODONE-acetaminophen (PERCOCET/ROXICET) 5-325 MG per tablet Take 1 tablet by mouth 3 (three) times daily as needed for severe pain. 10/11/14   Venia Carbon, MD  potassium chloride SA (K-DUR,KLOR-CON) 20 MEQ tablet Take 1 tablet (20 mEq total) by mouth 2 (two) times daily. 04/06/14   Venia Carbon, MD  Rivaroxaban (XARELTO) 15 MG TABS tablet Take 1 tablet (15 mg total) by mouth 2 (two) times daily with a meal. 12/14/14   Mary Sella  Fletke, MD   BP 116/68 mmHg  Pulse 63  Temp(Src) 98 F (36.7 C) (Oral)  Resp 20  Ht 5\' 6"  (1.676 m)  Wt 153 lb 1.6 oz (69.446 kg)  BMI 24.72 kg/m2  SpO2 96% Physical Exam  Constitutional: He is oriented to person, place, and time. He appears well-developed and well-nourished. No distress.  HENT:  Head: Normocephalic.  Eyes: Conjunctivae are normal. Pupils are equal, round, and reactive to light. No scleral icterus.  Neck: Normal range of motion. Neck supple. No thyromegaly present.  Cardiovascular: Normal rate and regular rhythm.  Exam reveals no gallop and no friction rub.   No murmur heard. Pulmonary/Chest: Effort normal and breath sounds normal. No respiratory distress. He has no wheezes. He has no rales.  Abdominal: Soft. Bowel sounds are normal. He exhibits no distension. There is no tenderness. There is no rebound.  Musculoskeletal: Normal range of motion.       Legs:      Feet:  Neurological: He is alert and oriented to person, place, and time.  Skin: Skin is warm and dry. No rash noted.  Psychiatric: He has a normal mood and affect. His behavior is normal.    ED Course  Procedures (including critical care time) Labs Review Labs Reviewed  BASIC METABOLIC PANEL - Abnormal; Notable for the following:    Sodium 133 (*)    BUN 25 (*)    Creatinine, Ser 1.67 (*)    GFR  calc non Af Amer 36 (*)    GFR calc Af Amer 42 (*)    All other components within normal limits  CBC WITH DIFFERENTIAL/PLATELET  URIC ACID    Imaging Review No results found.   EKG Interpretation None      MDM   Final diagnoses:  Cellulitis of right lower extremity    Patient has trace joint effusion to the ankle. He has bilateral venous stasis disease. He is a small area of erythema adjacent left new malleolus posteriorly. He does not have increased size of the circumference of the calf right versus left. No palpable cording posterior leg. He has a small area of superficial phlebitis palpable in the right medial calf.  Headache is appropriate to treat him with antibiotics. He has outpatient follow-up in 48 hours with primary care. Return here with any worsening symptoms.    Tanna Furry, MD 12/15/14 [phone]

## 2014-12-12 NOTE — Discharge Instructions (Signed)
°  Pain Management Clinics in the [place] area.  [facility]  2 reviews  [facility]  [place] [address]  [phone]  Website  Directions   [facility]  3.5 (7)  Pain Management Physician  [address]  [phone]  Website  Directions   [facility]: Marlaine Hind MD  1 review  Doctor  [phone]  [phone]  Directions    Cellulitis Cellulitis is an infection of the skin and the tissue under the skin. The infected area is usually red and tender. This happens most often in the arms and lower legs. HOME CARE   Take your antibiotic medicine as told. Finish the medicine even if you start to feel better.  Keep the infected arm or leg raised (elevated).  Put a warm cloth on the area up to 4 times per day.  Only take medicines as told by your doctor.  Keep all doctor visits as told. GET HELP IF:  You see red streaks on the skin coming from the infected area.  Your red area gets bigger or turns a dark color.  Your bone or joint under the infected area is painful after the skin heals.  Your infection comes back in the same area or different area.  You have a puffy (swollen) bump in the infected area.  You have new symptoms.  You have a fever. GET HELP RIGHT AWAY IF:   You feel very sleepy.  You throw up (vomit) or have watery poop (diarrhea).  You feel sick and have muscle aches and pains. MAKE SURE YOU:   Understand these instructions.  Will watch your condition.  Will get help right away if you are not doing well or get worse. Document Released: 02/06/2008 Document Revised: 01/04/2014 Document Reviewed: 11/05/2011 [facility] Patient Information 2015 [place], [place]. This information is not intended to replace advice given to you by your health care provider. Make sure you discuss any questions you have with your health care provider.

## 2014-12-12 NOTE — ED Notes (Signed)
EDP at the bedside.  ?

## 2014-12-12 NOTE — ED Notes (Signed)
Pt made aware to return if symptoms worsen or if any life threatening symptoms occur.   

## 2014-12-14 ENCOUNTER — Ambulatory Visit (INDEPENDENT_AMBULATORY_CARE_PROVIDER_SITE_OTHER): Payer: PPO | Admitting: Family Medicine

## 2014-12-14 ENCOUNTER — Encounter: Payer: Self-pay | Admitting: Family Medicine

## 2014-12-14 ENCOUNTER — Ambulatory Visit (HOSPITAL_COMMUNITY)
Admission: RE | Admit: 2014-12-14 | Discharge: 2014-12-14 | Disposition: A | Discharge status: Home / Self Care | Payer: PPO | Source: Ambulatory Visit | Attending: Family Medicine | Admitting: Family Medicine

## 2014-12-14 VITALS — BP 132/75 | HR 74 | Temp 98.2°F | Ht 66.0 in | Wt 153.1 lb

## 2014-12-14 DIAGNOSIS — I82409 Acute embolism and thrombosis of unspecified deep veins of unspecified lower extremity: Secondary | ICD-10-CM | POA: Insufficient documentation

## 2014-12-14 DIAGNOSIS — M79605 Pain in left leg: Secondary | ICD-10-CM

## 2014-12-14 DIAGNOSIS — M79604 Pain in right leg: Secondary | ICD-10-CM

## 2014-12-14 DIAGNOSIS — I10 Essential (primary) hypertension: Secondary | ICD-10-CM | POA: Diagnosis not present

## 2014-12-14 DIAGNOSIS — R609 Edema, unspecified: Secondary | ICD-10-CM

## 2014-12-14 DIAGNOSIS — N058 Unspecified nephritic syndrome with other morphologic changes: Secondary | ICD-10-CM

## 2014-12-14 DIAGNOSIS — I83893 Varicose veins of bilateral lower extremities with other complications: Secondary | ICD-10-CM | POA: Insufficient documentation

## 2014-12-14 DIAGNOSIS — I5042 Chronic combined systolic (congestive) and diastolic (congestive) heart failure: Secondary | ICD-10-CM

## 2014-12-14 DIAGNOSIS — I809 Phlebitis and thrombophlebitis of unspecified site: Secondary | ICD-10-CM

## 2014-12-14 DIAGNOSIS — E1129 Type 2 diabetes mellitus with other diabetic kidney complication: Secondary | ICD-10-CM

## 2014-12-14 DIAGNOSIS — I82401 Acute embolism and thrombosis of unspecified deep veins of right lower extremity: Secondary | ICD-10-CM | POA: Diagnosis not present

## 2014-12-14 DIAGNOSIS — K219 Gastro-esophageal reflux disease without esophagitis: Secondary | ICD-10-CM | POA: Diagnosis not present

## 2014-12-14 DIAGNOSIS — R590 Localized enlarged lymph nodes: Secondary | ICD-10-CM | POA: Diagnosis not present

## 2014-12-14 DIAGNOSIS — I2510 Atherosclerotic heart disease of native coronary artery without angina pectoris: Secondary | ICD-10-CM

## 2014-12-14 DIAGNOSIS — I5040 Unspecified combined systolic (congestive) and diastolic (congestive) heart failure: Secondary | ICD-10-CM | POA: Insufficient documentation

## 2014-12-14 MED ORDER — RIVAROXABAN 15 MG PO TABS: 15.0000 mg | ORAL_TABLET | Freq: Two times a day (BID) | ORAL | Status: DC

## 2014-12-14 NOTE — Assessment & Plan Note (Addendum)
Bilateral leg edema likely due to several causes, with heart failure a contributing factor. No additional signs of volume overload noted today. - continue furosemide  Dois Davenport, MS3  Agree with the medical student assessment and plan. Reviewed echo from 2015. Exam not consistent with acute CHF exacerbation. Dossie Arbour MD

## 2014-12-14 NOTE — Progress Notes (Signed)
Bilateral lower extremity venous duplex completed.  Right:  DVT noted in the profunda vein. There appears to be superficial thrombosis in a varicose vein in the right calf..  No Baker's cyst.  Left:  No evidence of DVT, superficial thrombosis, or Baker's cyst.

## 2014-12-14 NOTE — Patient Instructions (Addendum)
It was nice to meet you today.  Please go to the hospital to get a venous doppler of the legs to rule out DVT.   Please schedule a follow up with our pharmacy team to get ABI's. (Ankle Brachial Indexes).  Please also schedule an appointment with me after the pharmacy appointment to discuss the results and your diabetes.     Notes from second visit.  1. Start Xarelto 15 mg twice daily, take for 21 days 2. Schedule follow up with Dr. Ree Kida in 1-2 weeks 3. Please call me immediately or go to the ER if you have bleeding or have increased shortness of breath.

## 2014-12-14 NOTE — Assessment & Plan Note (Signed)
Symptoms well controlled on omeprazole 20mg  BID before meals.  - continue omeprazole  Dois Davenport, MS3

## 2014-12-14 NOTE — Assessment & Plan Note (Addendum)
Patient reports significant bilateral leg pain throughout calf and ankle with erythema, edema, and multiple superficial veins noted on exam. Likely due to a combination of superficial thrombophlebitis, venous stasis, heart failure, diabetic neuropathy, and DVT.  - duplex ultrasound 12/14/2014: DVT noted in right profundus, prescribed Xarelto, follow up in 1 week - refer to pharmacy for measurement of ABI  - depending on results of ABI, consider applying a Unna boot to alleviate the edema - continue furosemide  [name] Elliott, MS3  Agree with the medical student assessment and plan. Patient presented with increased pain and swelling of right lower extremtiey. Differential included DVT/pain from chronic venous insufficiency/diabetic neuropathy/cellulitis.  -stat LE Venous duplex ordered to rule out DVT -patient counseled to continue current Percocet and Gabapentin as needed -if no evidence of DVT will schedule with pharmacy team to check ABI's prior to treatment of venous insufficiency with an unna boot -counseled to keep foot elevated and provided ACE bandages for compression (unable to tolerate compression stockings)  [name] Arbour MD

## 2014-12-14 NOTE — Assessment & Plan Note (Addendum)
BP controlled today at 132/75. Followed by cardiologist.  - Continue furosemide  Dois Davenport, MS3  Agree with medical student assessment and plan. Continue Imdur and Lasix. Dossie Arbour MD

## 2014-12-14 NOTE — Assessment & Plan Note (Addendum)
A1C 08/02/2014 6.4. Patient reports fasting blood glucose between 80 to 90. Microfilament testing today revealed no abnormalities. Patient reports no vision changes.  - continue metformin  Dois Davenport, MS3

## 2014-12-14 NOTE — Progress Notes (Signed)
Subjective:     Patient ID: [name] Elliott, male   DOB: [birth year], 81 y.o.   MRN: [id number]  Written by: Dois Davenport, MS3  HPI [name] Elliott is an 81 year old male who presents today for a new patient visit. Patient was previously seen by [name] Elliott at [facility] until he retired. He has since been following with [name] Elliott. [name] Elliott wishes to establish with a new PCP as his previous physician "would not send me to pain management for my leg pain".   PMH/PSH/Allergies/Medications/Social history reviewed and updated in Epic. Reviewed New Patient Health History Form and placed in scan box.   Leg swelling: [name] Elliott reports bilateral chronic pain in his legs, with swelling and erythema. He describes a throbbing, sharp pain throughout his ankles and posterior calves. He denies shortness of breath and chest pain. He was diagnosed with superficial thrombophlebitis 3 years prior and reports worsening pain and swelling since. He has undergone vein removal surgery on his right leg. His previous primary care physician prescribed Percocet 5-325mg  every 4-6 hours as needed for pain. He is taking it as prescribed daily with no relief. Gabapentin also provides no relief. He elevates his legs 2 hours daily which decreases the swelling, but returns when he gets up. Elevation partially alleviates the pain. He has never been referred to Pain Management for pain control.   He was seen in the ED on 12/12/2014 for increased swelling and pain over the right medial malleolus. He was given cephalexin, which he has been taking since with no symptom relief. His last duplex ultrasound was 04/13/2014 and showed venous reflux bilaterally. I have reviewed the ED note from 12/12/14. No imaging of the area was completed at that time.   Diabetes: He checks his blood glucose every other day and reports readings in the 80s to 90s. He denies vision changes and numbness of the extremities. He is on Metformin.  Social  History: [name] Elliott lives alone and reports being able to complete all ADLs. He uses chewing tobacco everyday throughout the day. He denies alcohol use. He has multiple family friends and relatives who live close by and check on him frequently. He is dependent on his daughter [name] Elliott and his granddaughter for transportation to medical appointments.   Review of Systems  Constitutional: Negative for fever, chills and fatigue.  Respiratory: Negative for cough and shortness of breath.   Cardiovascular: Positive for leg swelling. Negative for chest pain.  Gastrointestinal: Positive for nausea. Negative for vomiting and diarrhea.  Musculoskeletal: Positive for arthralgias and gait problem.  Skin: Positive for rash and wound.       Objective:   Physical Exam  BP 132/75 mmHg  Pulse 74  Temp(Src) 98.2 F (36.8 C) (Oral)  Ht 5\' 6"  (1.676 m)  Wt 153 lb 1.6 oz (69.446 kg)  BMI 24.72 kg/m2 General: NAD, Caucasian male, accompanied by daughter HEENT: Normocephalic, PERRLA, EOMI, nasal septum midline, no rhinorrhea, MMM, uvula midline, dentures Neck: no cervical, submandibular, or supraclavicular LAD. No thyroid nodules CV: irregular rate, S1 and S2 present, no MRG, no heaves or thrills Pulmonary: CTAB, no wheezes or crackles, normal work of breathing Abdominal: no scars, no distension, normoactive bowel sounds, no tenderness to palpation Neuro: CN III-XII intact, strength 5/5 in all extremities, microfilament testing of feet showed no abnormalities bilaterally MSK: ROM of ankles limited by pain bilaterally. Swelling over right medial malleolus. Erythema of calves and ankles bilaterally with multiple superficial distended veins visible. Calves and ankles  tender to light palpation bilaterally (Right > Left). Right and left calf circumference 37cm. 2+ DP pulses bilaterally, difficulty to palpate TP pulses due to edema, the posterior ankle/lower calf has the appearance of an early ulceration consistent  with chronic venous insufficiency, no current drainage     Assessment:     Please see Problem List.     Plan:     Please see Problem List.     I personally saw and evaluated the patient. I have reviewed the medical student note and agree with the documentation. I personally completed the physical exam. Additions to the medical student note are made in blue.   [name] Arbour MD   The patient was seen later in the afternoon on the same day of initial visit (4/12). He had been sent for stat LE doppler to rule out DVT which showed right profunda femoris DVT. Patient was provided samples of Xarelto 15 mg to take BID for 2 weeks. We discussed warning signs of PE and Bleeding, we also discussed reasons to return to the office/ED including sob and bleeding.   [name] Arbour MD

## 2014-12-15 NOTE — Assessment & Plan Note (Signed)
Patient found to have acute DVT of profunda femoris vein on right side. Also found to have bilateral enlarged inguinal lymph nodes.  - patient started on Xarelto 15 mg BID for 21 days, will transition to maintenance therapy after this time -follow up scheduled in 1-2 weeks -discussed side effects of Xarelto including risk of bleeding (patient has HadBled score of 6), given acute nature of DVT and risk of progression/PE patient was started on anticoagulation -bilateral inguinal lymph nodes may represent reactive process due to DVT/possible cellulitis of lower extremity. Malignancy can not fully be ruled out. Plan to examine inguinal lymph nodes during next visit. Will consider repeat US to evaluate for resolution.

## 2014-12-15 NOTE — Assessment & Plan Note (Signed)
Patient has evidence of chronic venous insufficiency which is leading to leg pain and ulceration. -stat LE Doppler to rule out DVT -schedule pharmacy follow up to check ABI's, if normal initiate therapy with unna boot -continue Gabapentin as diabetic neuropathy is likely contributing to pain -continue Percocet (no refills provided, will need to check UDS and sign pain contract if plan for narcotics in future)

## 2014-12-15 NOTE — Assessment & Plan Note (Signed)
Patient has known CAD with combined systolic and diastolic heart failure. Unclear why not on BB/ACE-I. As this is your first visit no changes in therapy today however will need to address at follow up visit.

## 2014-12-15 NOTE — Addendum Note (Signed)
Addended by: Lupita Dawn on: 12/15/2014 11:53 AM   Modules accepted: Level of Service

## 2014-12-17 ENCOUNTER — Ambulatory Visit: Payer: PPO | Admitting: Pharmacist

## 2014-12-21 ENCOUNTER — Telehealth: Payer: Self-pay | Admitting: Internal Medicine

## 2014-12-21 NOTE — Telephone Encounter (Signed)
Noted I don't think he was that satisfied with my care so I am not surprised. I hope he is happy with his new doctor

## 2014-12-21 NOTE — Telephone Encounter (Signed)
Pt's daughter called to cancel upcoming Medicare Wellness visit.  Pt has changed PCP to Dr. Ree Kida / lt

## 2014-12-23 ENCOUNTER — Encounter: Payer: Self-pay | Admitting: Pharmacist

## 2014-12-23 ENCOUNTER — Ambulatory Visit (INDEPENDENT_AMBULATORY_CARE_PROVIDER_SITE_OTHER): Payer: PPO | Admitting: Pharmacist

## 2014-12-23 VITALS — BP 108/70 | HR 76 | Ht 61.02 in | Wt 147.5 lb

## 2014-12-23 DIAGNOSIS — I83893 Varicose veins of bilateral lower extremities with other complications: Secondary | ICD-10-CM

## 2014-12-23 DIAGNOSIS — I82402 Acute embolism and thrombosis of unspecified deep veins of left lower extremity: Secondary | ICD-10-CM

## 2014-12-23 NOTE — Progress Notes (Signed)
S:    Patient arrives in good spirits today ambulating with cain accompanied by daughter.   He presents to the clinic for PAD/ABI evaluation.   Patient is still complaining of sharp/shooting pain in his right lower extremity where clot was found. There is minimal swelling and minimal erythema.  Patient denies his legs feeling cold.  PMH: Superficial thrombosis in a varicose vein in the right calf, vein removal surgery on his right leg, and TIA/stroke in Oct/Nov 2015. Patient lives independently at home with dog- daughter lives 10 minutes away.   He continues to chew 1 pack of dip daily.  O:  Lower extremity Physical Exam includes minimal swelling and minimal erythema.   A/P: Patient has a small site on right medial calf where vein was removed from that is heeling nicely.  Advised pt to continue wearing stockings and to elevate legs above heart while resting on recliner.  Because of concerns of GI bleed/ulcer and fall risk, we will stop the aspirin 81mg  temporarily.  After Xarelto dose has been decreased to one tablet a day (15mg ), we will reconsider adding the Aspirin at next visit with Dr. Ree Kida.    Reconsider ABI 1-2 months after clot resolution.   Results reviewed and written information provided.   F/U Clinic Visit with Dr. Ree Kida on December 28, 2014. Total time in face-to-face counseling 45 minutes.  Patient seen with Dr. Algis Greenhouse. & Eunice Blase, PharmD Candidate.

## 2014-12-23 NOTE — Assessment & Plan Note (Signed)
Patient has a small site on right medial calf where vein was removed from that is heeling nicely.  Advised pt to continue wearing stockings and to elevate legs above heart while resting on recliner.  Because of concerns of GI bleed/ulcer and fall risk, we will stop the aspirin 81mg  temporarily.  After Xarelto dose has been decreased to one tablet a day (15mg ), we will reconsider adding the Aspirin at next visit with Dr. Ree Kida.    Reconsider ABI 1-2 months after clot resolution.

## 2014-12-23 NOTE — Progress Notes (Signed)
   Subjective:    Patient ID: [name] Elliott, male    DOB: [birth year], 81 y.o.   MRN: [id number]  HPI Reviewed: Agree with Dr. Graylin Shiver documentation and management.    Review of Systems     Objective:   Physical Exam        Assessment & Plan:

## 2014-12-23 NOTE — Patient Instructions (Addendum)
It was great to see you today!  Stop taking your aspirin 81mg  for now.  Once the Xarelto goes down to one tablet- we will consider re-starting the aspirin.  To help with the swelling- get your feet elevated above your heart.  Sit with pillows on your recliner and continue wearing stockings.   Follow up with Dr. Ree Kida on December 28, 2014

## 2014-12-27 ENCOUNTER — Ambulatory Visit: Payer: PPO | Admitting: Family Medicine

## 2014-12-28 ENCOUNTER — Ambulatory Visit (INDEPENDENT_AMBULATORY_CARE_PROVIDER_SITE_OTHER): Payer: PPO | Admitting: Family Medicine

## 2014-12-28 ENCOUNTER — Encounter: Payer: Self-pay | Admitting: Family Medicine

## 2014-12-28 VITALS — BP 125/74 | HR 71 | Temp 97.7°F | Ht 64.0 in | Wt 152.6 lb

## 2014-12-28 DIAGNOSIS — K5909 Other constipation: Secondary | ICD-10-CM | POA: Diagnosis not present

## 2014-12-28 DIAGNOSIS — I82401 Acute embolism and thrombosis of unspecified deep veins of right lower extremity: Secondary | ICD-10-CM | POA: Diagnosis not present

## 2014-12-28 DIAGNOSIS — E1129 Type 2 diabetes mellitus with other diabetic kidney complication: Secondary | ICD-10-CM

## 2014-12-28 DIAGNOSIS — M79604 Pain in right leg: Secondary | ICD-10-CM

## 2014-12-28 DIAGNOSIS — N058 Unspecified nephritic syndrome with other morphologic changes: Secondary | ICD-10-CM

## 2014-12-28 DIAGNOSIS — E119 Type 2 diabetes mellitus without complications: Secondary | ICD-10-CM

## 2014-12-28 DIAGNOSIS — R599 Enlarged lymph nodes, unspecified: Secondary | ICD-10-CM

## 2014-12-28 DIAGNOSIS — M79605 Pain in left leg: Secondary | ICD-10-CM

## 2014-12-28 DIAGNOSIS — Z23 Encounter for immunization: Secondary | ICD-10-CM

## 2014-12-28 DIAGNOSIS — R590 Localized enlarged lymph nodes: Secondary | ICD-10-CM | POA: Insufficient documentation

## 2014-12-28 LAB — POCT GLYCOSYLATED HEMOGLOBIN (HGB A1C): Hemoglobin A1C: 6.4

## 2014-12-28 MED ORDER — SENNOSIDES-DOCUSATE SODIUM 8.6-50 MG PO TABS: 2.0000 | ORAL_TABLET | Freq: Every day | ORAL | Status: DC

## 2014-12-28 MED ORDER — RIVAROXABAN 15 MG PO TABS
15.0000 mg | ORAL_TABLET | Freq: Two times a day (BID) | ORAL | Status: DC
Start: 2014-12-28 — End: 2014-12-28

## 2014-12-28 MED ORDER — RIVAROXABAN 15 MG PO TABS: 15.0000 mg | ORAL_TABLET | Freq: Every day | ORAL | Status: DC

## 2014-12-28 MED ORDER — METFORMIN HCL 500 MG PO TABS: 500.0000 mg | ORAL_TABLET | Freq: Two times a day (BID) | ORAL | Status: DC

## 2014-12-28 NOTE — Assessment & Plan Note (Signed)
Patient continues to have bilateral leg pain (Right greater than left). Due to venous insufficiency, superficial thrombophlebitis, and DVT. (See note for DVT). -keep feet elevated and wear compression stockings when able to -check ABI after acute DVT period has passed to evaluate for PAD -if no evidence of PAD consider unna boot -continue gabapentin and prn narcotics (still has remaining Percocet and Oxycodone from previous PCP and ED), will need UDS/pain contract when he needs refills

## 2014-12-28 NOTE — Assessment & Plan Note (Addendum)
Patient tolerating Xarelto well. No bleeding. Continues to hold ASA. No change in leg symptoms (see note on leg pain) -complete 21 days of Xarelto 15 mg BID then transition to 15 mg daily -restart ASA when transitioned to once daily Xarelto -continue to monitor for bleeding -Inguinal lymph nodes not examined today (examine at follow up visits, normal CBC 2 weeks ago)

## 2014-12-28 NOTE — Assessment & Plan Note (Signed)
Inguinal lymphadenopathy found during LE duplex on 12/14/14. Inguinal lymph nodes not examined today. CBC two weeks ago unremarkable. Could represent reactive lymphadenopathy from cellulitis that patient was being treated for at the time.  -examine at follow up visit -If remain present will need further workup for malignancy

## 2014-12-28 NOTE — Patient Instructions (Addendum)
Diabetes - well controlled, please follow up with your eye doctor, stop metformin  DVT - continue twice daily Xarelto for one more week, then transition to 15 mg (one tablet) daily.  Leg Pain - continue Oxycodone as needed  Constipation - start Senna S two tablets daily  Return for follow up in 2-4 weeks.

## 2014-12-28 NOTE — Assessment & Plan Note (Signed)
Patient reports constipation related to narcotics -prescription for Senna-S provided

## 2014-12-28 NOTE — Assessment & Plan Note (Addendum)
Type 2 Diabetes well controlled. A1C 6.4 today. -stop metformin as serum Cr greater than 1.5 -recheck A1C in 3 months -patient counseled to make follow up appointment with ophthalmologist

## 2014-12-28 NOTE — Progress Notes (Signed)
   Subjective:    Patient ID: [name] Elliott, male    DOB: 01-23-1928, 81 y.o.   MRN: [id number]  HPI 81 y/o male presents for follow up of DVT. Accompanied by daughter  DVT/superficial thrombophlebitis - diagnosed 12/14/14, started on Xarelto 15 mg BID, taking as prescribed, denies bleeding episodes, holding ASA per pharmacy team recs, not able to tolerate compression stockings however has been keeping legs elevated and wearing long socks  Right leg pain - related to chronic venous stasis, patient continues to have continues stabbing pain in the right lower leg, taking oxycodone (recently prescribed by ER) intermittently which improves his symptoms, he reports not taking the narcotics regularly as they cause constipation, has had Miralax in the past which he states does not help, also has stool softener at home which he does not take regularly  Non Insulin Dependent Type 2 Diabetes - currently on metformin 500 mg BID, tolerating well, no associated GI upset or diarrhea, does not check blood sugars at home, due for yearly eye exam  Social - lives alone, former smoker   Review of Systems  Constitutional: Negative for fever, chills and fatigue.  Respiratory: Negative for cough and shortness of breath.   Cardiovascular: Positive for leg swelling. Negative for chest pain.  Gastrointestinal: Negative for nausea, vomiting and diarrhea.       Objective:   Physical Exam Vitals: reviewed Gen: pleasant Caucasian male, NAD, accompanied by daughter HEENT: normocephalic, PERRL, EOMI, no scleral icterus, MMM Cardiac: Regular rhythm with occasional irregular beats, no heaves/thrills Resp: CTAB, normal effort Abd: soft, no tenderness, normal bowel sounds Ext: 1+ edema of right LE, erythema of posterior ankle with to warmth, 2+ DP pulse or right LE  POC A1C 6.4 Reviewed lab work from previous 6 months.     Assessment & Plan:  Please see problem specific assessment and plan.   13 valent PNA  vaccine provided today.

## 2014-12-30 ENCOUNTER — Telehealth: Payer: Self-pay | Admitting: Family Medicine

## 2014-12-30 NOTE — Telephone Encounter (Signed)
[facility]: Wants to know if pt can receive a 90 day supply or Loistine Simas This is a mail order co Message can be left on her voicemail

## 2014-12-31 NOTE — Telephone Encounter (Signed)
Note to nursing staff - it is ok for patient to receive 90 day supply of Xarelto15 mg daily, please call [facility] to let them know.

## 2014-12-31 NOTE — Telephone Encounter (Signed)
Nunzio Cobbs with [employer] informed.

## 2015-01-07 ENCOUNTER — Encounter: Payer: Commercial Managed Care - HMO | Admitting: Internal Medicine

## 2015-01-12 ENCOUNTER — Emergency Department (HOSPITAL_COMMUNITY): Admission: EM | Admit: 2015-01-12 | Discharge: 2015-01-12 | Discharge status: Against Medical Advice | Payer: PPO

## 2015-01-13 ENCOUNTER — Encounter: Payer: Self-pay | Admitting: Family Medicine

## 2015-01-13 ENCOUNTER — Telehealth: Payer: Self-pay | Admitting: Family Medicine

## 2015-01-13 ENCOUNTER — Ambulatory Visit
Admission: RE | Admit: 2015-01-13 | Discharge: 2015-01-13 | Disposition: A | Discharge status: Home / Self Care | Payer: PPO | Source: Ambulatory Visit | Attending: Family Medicine | Admitting: Family Medicine

## 2015-01-13 ENCOUNTER — Ambulatory Visit (INDEPENDENT_AMBULATORY_CARE_PROVIDER_SITE_OTHER): Payer: PPO | Admitting: Family Medicine

## 2015-01-13 VITALS — BP 116/55 | HR 80 | Temp 98.7°F | Ht 61.0 in | Wt 154.5 lb

## 2015-01-13 DIAGNOSIS — W19XXXA Unspecified fall, initial encounter: Secondary | ICD-10-CM

## 2015-01-13 DIAGNOSIS — S59911A Unspecified injury of right forearm, initial encounter: Secondary | ICD-10-CM

## 2015-01-13 DIAGNOSIS — S59901A Unspecified injury of right elbow, initial encounter: Secondary | ICD-10-CM | POA: Diagnosis not present

## 2015-01-13 DIAGNOSIS — M25521 Pain in right elbow: Secondary | ICD-10-CM

## 2015-01-13 MED ORDER — SILVER SULFADIAZINE 1 % EX CREA: 1.0000 "application " | TOPICAL_CREAM | Freq: Every day | CUTANEOUS | Status: DC

## 2015-01-13 NOTE — Patient Instructions (Signed)
Follow up in 1 week.  Change the dressing every 1-2 days.    He is okay to bath and cleanse the area gently.  Take care  Dr. Lacinda Axon

## 2015-01-13 NOTE — Telephone Encounter (Signed)
Patient has appointment this morning with Dr. Lacinda Axon

## 2015-01-13 NOTE — Telephone Encounter (Signed)
Pt fell either Monday or Tuesday night. He has an injury to his arm from his wrist to his elbow. Daughter feels it might be getting infected. She is begging for him to be worked in today during clinic Please advise

## 2015-01-13 NOTE — Assessment & Plan Note (Signed)
Superficial wounds. Wounds redressed with tefla pads, gauze and coban. Rx for Silvadene given. Follow up in 1 week for a wound check.

## 2015-01-13 NOTE — Progress Notes (Signed)
   Subjective:    Patient ID: [name] Elliott, male    DOB: 1927-10-22, 81 y.o.   MRN: [id number]  HPI 81 year old male with an extensive PMH including DM-2, CKD III, Stroke, Hx of DVT (currently on Xarelto), Vascular dementia, CAD, HTN, Hx of GI bleed, and Combined Systolic and Diastolic CHF presents for a same day appointment for evaluation of right elbow pain and right forearm wound.  1) Right elbow pain & Right Forearm Wound  Patient accompanied by his daughter today.  He reports that on Monday he was fixing his lawnmower and slipped and fell on the lawnmower deck.  When he did so, he injured his elbow and suffered several superficial cuts and abrasions to his right forearm.  Daughter reports that when she found out a friend (who is a paramedic) came over and dressed his wounds with silvadene and covered with gauze and coban.  Daughter is concerned that his wounds may be infected.  Additionally, she is concerned about the elbow pain.  No meds tried.  No relieving factors. His pain is worse with activity/ROM.  No recent fever, chills.   No reported wound drainage.   Social Hx - Former Smoker. PMH reviewed.   Review of Systems  Constitutional: Negative for fever and chills.  Musculoskeletal: Positive for joint swelling.       Elbow pain and swelling (as well as bruising).   Skin: Positive for wound.      Objective:   Physical Exam Filed Vitals:   01/13/15 1059  BP: 116/55  Pulse: 80  Temp: 98.7 F (37.1 C)   Vital signs reviewed.  Exam: General: chronically ill appearing male in NAD.  MSK: R Elbow -  Inspection: bruising and swelling noted. No warmth. Palpation - tender to palpation on the medial and lateral aspects. Decreased ROM in all planes and very painful.  Skin: 3 moderate sized superficial cuts noted on the dorsal and ventral aspect of the right forearm.  Mild bleeding after removal of guaze/bandage.  No purulence/drainage. Surrounding ecchymosis diffusely.    Neuro/Extremity: R arm/hand - neurovascularly intact. 2+ radial pulse.    Assessment & Plan:  See Problem List

## 2015-01-13 NOTE — Assessment & Plan Note (Signed)
Given physical exam findings, age and recent fall I obtained an xray today. I personally reviewed with images.  It revealed a fat pad sign indicative of effusion and likely fracture (although I did not visualize a fracture). This is in accordance with the radiology read. I informed patient's daughter of this.  I am sending him to Orthopedics for further evaluation/management.  He has an appt with Raliegh Ip tomorrow at 2 PM.

## 2015-01-14 ENCOUNTER — Telehealth: Payer: Self-pay | Admitting: Family Medicine

## 2015-01-14 NOTE — Telephone Encounter (Signed)
Pt called and wanted to know if he needed an antibiotics for his arm. The pharmacist suggested this. jw

## 2015-01-17 NOTE — Telephone Encounter (Signed)
LM for patient to call back. [name] Elliott,CMA  

## 2015-01-17 NOTE — Telephone Encounter (Signed)
No antibiotics needed.

## 2015-01-19 ENCOUNTER — Ambulatory Visit: Payer: PPO | Admitting: Family Medicine

## 2015-01-19 NOTE — Telephone Encounter (Signed)
LM for patient or daughter to call back. Kallan Merrick,CMA

## 2015-01-24 ENCOUNTER — Other Ambulatory Visit: Payer: Self-pay | Admitting: Family Medicine

## 2015-01-25 ENCOUNTER — Encounter: Payer: Self-pay | Admitting: Family Medicine

## 2015-01-25 ENCOUNTER — Ambulatory Visit (INDEPENDENT_AMBULATORY_CARE_PROVIDER_SITE_OTHER): Payer: PPO | Admitting: Family Medicine

## 2015-01-25 VITALS — BP 122/60 | HR 68 | Temp 98.1°F | Ht 64.0 in | Wt 152.7 lb

## 2015-01-25 DIAGNOSIS — I82401 Acute embolism and thrombosis of unspecified deep veins of right lower extremity: Secondary | ICD-10-CM | POA: Diagnosis not present

## 2015-01-25 DIAGNOSIS — S59911D Unspecified injury of right forearm, subsequent encounter: Secondary | ICD-10-CM

## 2015-01-25 DIAGNOSIS — M79604 Pain in right leg: Secondary | ICD-10-CM | POA: Diagnosis not present

## 2015-01-25 DIAGNOSIS — T148 Other injury of unspecified body region: Secondary | ICD-10-CM | POA: Diagnosis not present

## 2015-01-25 DIAGNOSIS — R590 Localized enlarged lymph nodes: Secondary | ICD-10-CM

## 2015-01-25 DIAGNOSIS — G8929 Other chronic pain: Secondary | ICD-10-CM

## 2015-01-25 DIAGNOSIS — Z7189 Other specified counseling: Secondary | ICD-10-CM

## 2015-01-25 DIAGNOSIS — S59901D Unspecified injury of right elbow, subsequent encounter: Secondary | ICD-10-CM

## 2015-01-25 DIAGNOSIS — R599 Enlarged lymph nodes, unspecified: Secondary | ICD-10-CM

## 2015-01-25 DIAGNOSIS — T148XXA Other injury of unspecified body region, initial encounter: Secondary | ICD-10-CM | POA: Insufficient documentation

## 2015-01-25 DIAGNOSIS — M79605 Pain in left leg: Secondary | ICD-10-CM

## 2015-01-25 MED ORDER — HYDROCODONE-ACETAMINOPHEN 5-325 MG PO TABS: 1.0000 | ORAL_TABLET | Freq: Four times a day (QID) | ORAL | Status: DC | PRN

## 2015-01-25 NOTE — Progress Notes (Signed)
   Subjective:    Patient ID: [name] Elliott, male    DOB: 02-19-1928, 81 y.o.   MRN: [id number]  HPI 81 y/o male presents for right elbow pain.  Right elbow pain - sustained injury earlier this month (see previous progress note), xray of elbow showed effusion suspicious for radial head fracture, seen by [facility] and determined to not have a fracture, he also had a right hand xray which was negative for fracture, currently no elbow pain or decreased range of motion, the superficial skin wounds have healed and he is not longer using the dressings.   Skin lesion on left forearm - has popped up in the past few days, no inciting cut/injury/bite that the patient can recall, does report tick bite on palmar aspect of forearm earlier this week (no associated rash)  Chronic leg pain/DVT - patient taking Eliquis, no bleeding issues, continues to have daily chronic pain, pain is mostly in ankles, stabbing pain that will awake him from sleep at times, takes prn narcotics from previous PCP (last took last week). Has taken Percocet and Roxicet, last dose of narcotic pain medication is reported last week  Inguinal lymphadenopathy - seen on previous US, patient denies fevers/chills/night sweats/weight loss, no groin/inguinal pain, no known enlarged lymph nodes  Social - former smoker   Review of Systems  Constitutional: Negative for fever, chills and fatigue.  Respiratory: Negative for cough and shortness of breath.   Cardiovascular: Positive for leg swelling. Negative for chest pain.  Gastrointestinal: Negative for nausea, vomiting and diarrhea.  Skin: Positive for wound.       Objective:   Physical Exam Vitals: reviewed Gen: pleasant Caucasian male, accompanied by daughter, NAD HEENT: normocephalic, pupils equal, MMM Skin: multiple well healing abrasions of right upper extremity, multiple superficial hematomas of dorsal aspect of the left forearm (in area where patient noted skin  changes), one hematoma has broken open and has centralized dried blood (total area of largest contusion is 2.5 by 1.5 cm); small 2 mm by 31mm well healed mark over palmar aspect of left forearm where patient states that he was bitten by a tick (no target lesion noted) MSK: no right elbow tenderness, no right elbow swelling or erythema Ext: 1+ bilateral lower extremity edema up to mid shin, slight erythema of bilateral ankles consistent with chronic stasis, multiple varicosities present, palpable DP pulses bilatearlly Lymph: small inguinal right sided lymph node (less than 5 mm), no other inguinal/femoral adenopathy appreciated on exam   Left elbow xray on 5/12 showed elbow effusion consistent with possible radial head fracture.    Assessment & Plan:  Please see problem specific assessment and plan.

## 2015-01-25 NOTE — Patient Instructions (Signed)
It was nice to see you today.  Elbow injury - no further follow needed unless you have pain  Bleeding/injury to left forearm - likely bleeding due to [facility], will monitor that area  Leg pain - continue compression, elevated legs when able to, continue Gabapentin three times a day, start Norco (pain medication).

## 2015-01-25 NOTE — Assessment & Plan Note (Signed)
Superficial wounds healing well -continue local wound care

## 2015-01-25 NOTE — Assessment & Plan Note (Signed)
Patient prescribed Norco for chronic leg pain -UDS obtained (last dose of narcotic is reported as one week ago) -Pain contract signed -Reviewed [place] narcotic database and no red flags identified

## 2015-01-25 NOTE — Assessment & Plan Note (Signed)
Multiple superficial hematomas of left forearm. Largest 1.5 by 2.5 cm with centralized dried blood. No concern for malignancy. -continue to hold asa -monitor clinically

## 2015-01-25 NOTE — Assessment & Plan Note (Signed)
Physical exam today showed one small (less than 5 mm) right sided inguinal lymph node. No other inguinal/femoral adenopathy appreciated. No type B symptoms. -will continue to monitor -consider CT pelvis if patient develops type B symptoms or had inguinal/pelvic pain

## 2015-01-25 NOTE — Assessment & Plan Note (Signed)
Patient stable on once daily Xaralto.  -continue to hold asa due to superficial bruising

## 2015-01-25 NOTE — Assessment & Plan Note (Signed)
Bilateral leg pain secondary to venous insufficiency, peripheral neuropathy, and swelling from DVT. -patient prescribed Norco today (see encounter for chronic pan management) -continue Gabapentin -continue to elevate legs when able to -will need ABI's in near future to evaluate for PAD (holding over the past moth due to acute DVT)

## 2015-01-25 NOTE — Assessment & Plan Note (Signed)
Patient evaluated by orthopedics. No fracture identified. Patient reports no pain and exam is unremarkable today. -monitor clinically

## 2015-01-26 LAB — DRUG SCR UR, PAIN MGMT, REFLEX CONF
AMPHETAMINE SCRN UR: NEGATIVE
Barbiturate Quant, Ur: NEGATIVE
Benzodiazepines.: NEGATIVE
COCAINE METABOLITES: NEGATIVE
Creatinine,U: 26.83 mg/dL
Marijuana Metabolite: NEGATIVE
Methadone: NEGATIVE
Opiates: NEGATIVE
Phencyclidine (PCP): NEGATIVE
Propoxyphene: NEGATIVE

## 2015-01-28 ENCOUNTER — Telehealth: Payer: Self-pay | Admitting: Family Medicine

## 2015-01-28 NOTE — Telephone Encounter (Signed)
Attempted to return call, no answer, left message to return call

## 2015-01-28 NOTE — Telephone Encounter (Signed)
Daughter returned call. Clarified that patient should be taking Xaralto daily and no aspirin. Return visit in one month.

## 2015-01-28 NOTE — Telephone Encounter (Signed)
Was not at drs visit on Tuesday Has questions about the visit. Her Daughter was at visit and is tellling one thing and pt says another. Ms [name] Elliott needs clarification  Would like for dr Ree Kida to call

## 2015-02-02 ENCOUNTER — Telehealth: Payer: Self-pay | Admitting: Family Medicine

## 2015-02-02 ENCOUNTER — Other Ambulatory Visit: Payer: Self-pay | Admitting: Family Medicine

## 2015-02-02 MED ORDER — RIVAROXABAN 15 MG PO TABS: 15.0000 mg | ORAL_TABLET | Freq: Every day | ORAL | Status: DC

## 2015-02-02 NOTE — Telephone Encounter (Signed)
Spoke with patient's daughter. Needs refill for Xaralto. Was previously given 3 month supply however only picked up one month (due to cost). Mail pharmacy now states that prescription is expired. Sent 10 day supply to local pharmacy and 30 day supply with 2 refills to mail-order pharmacy.

## 2015-02-02 NOTE — Telephone Encounter (Signed)
Daughter Would like to talk to dr Ree Kida about some of dads meds

## 2015-02-14 ENCOUNTER — Other Ambulatory Visit: Payer: Self-pay | Admitting: Family Medicine

## 2015-02-14 MED ORDER — POTASSIUM CHLORIDE CRYS ER 20 MEQ PO TBCR: 20.0000 meq | EXTENDED_RELEASE_TABLET | Freq: Two times a day (BID) | ORAL | Status: DC

## 2015-02-14 NOTE — Telephone Encounter (Signed)
Patient needs a refill on hydrocodone and potassium. Would like for potassium to be sent to [facility]. Thank you, Fonda Kinder, ASA

## 2015-02-15 NOTE — Telephone Encounter (Signed)
Pt has an appt June 28.  He only has 2 pills left on hydrocodone. Dr Ree Kida said he review his meds on his monthly visit

## 2015-02-16 NOTE — Telephone Encounter (Signed)
Note to nursing staff - please call patient and inform him that I can not write for a narcotic script without first evaluating him, he is currently scheduled for 6/28. Offer to have him be seen this coming Tuesday 6/21 (I am currently full but ok to double book, preferably very early in the morning), thanks

## 2015-02-17 NOTE — Telephone Encounter (Signed)
LMOVM for pt daughter to call us back. Kerriann Kamphuis Kennon Holter, CMA

## 2015-02-17 NOTE — Telephone Encounter (Signed)
Pt daughter informed. And changed appt to June 21st @ 10. Sayan Aldava Kennon Holter, CMA

## 2015-02-22 ENCOUNTER — Telehealth: Payer: Self-pay | Admitting: Family Medicine

## 2015-02-22 ENCOUNTER — Ambulatory Visit (INDEPENDENT_AMBULATORY_CARE_PROVIDER_SITE_OTHER): Payer: PPO | Admitting: Family Medicine

## 2015-02-22 ENCOUNTER — Encounter: Payer: Self-pay | Admitting: Family Medicine

## 2015-02-22 ENCOUNTER — Ambulatory Visit: Payer: PPO | Admitting: Family Medicine

## 2015-02-22 VITALS — BP 129/61 | HR 64 | Temp 98.3°F | Ht 64.0 in | Wt 156.3 lb

## 2015-02-22 DIAGNOSIS — M79604 Pain in right leg: Secondary | ICD-10-CM

## 2015-02-22 DIAGNOSIS — R5381 Other malaise: Secondary | ICD-10-CM

## 2015-02-22 DIAGNOSIS — R413 Other amnesia: Secondary | ICD-10-CM | POA: Diagnosis not present

## 2015-02-22 DIAGNOSIS — M79605 Pain in left leg: Secondary | ICD-10-CM

## 2015-02-22 DIAGNOSIS — I83893 Varicose veins of bilateral lower extremities with other complications: Secondary | ICD-10-CM

## 2015-02-22 DIAGNOSIS — F0150 Vascular dementia without behavioral disturbance: Secondary | ICD-10-CM

## 2015-02-22 MED ORDER — HYDROCODONE-ACETAMINOPHEN 5-325 MG PO TABS: 1.0000 | ORAL_TABLET | Freq: Four times a day (QID) | ORAL | Status: DC | PRN

## 2015-02-22 NOTE — Telephone Encounter (Signed)
Daughter called and would like Dr. Ree Kida to call her. She wanted to ask a few questions. jw

## 2015-02-22 NOTE — Patient Instructions (Signed)
It was good to see you today  I have refilled your pain medication  Please schedule an appointment with the pharmacist to get you ABI's checked (ankle brachial indexes).  We will apply wraps to your legs if the blood flow to your feet is good.   Please continue to use your gabapentin and wear your compression stockings during the day.

## 2015-02-22 NOTE — Telephone Encounter (Signed)
Returned call to daughter (medical and healthcare power of attorney). She was unable to get information on previous nursing care as the patient did not allow her to get the information. She will attempt to get the information when she returns to his house later this week.   It also came up that he is still driving, Clinically I feel that he is not safe to drive due to chronic leg pain, will send letter to [facility] for him to have formal driving test.

## 2015-02-22 NOTE — Progress Notes (Signed)
   Subjective:    Patient ID: [name] Elliott, male    DOB: 1928-04-29, 81 y.o.   MRN: [id number]  HPI 81 y/o male presents for follow up of bilateral leg pain.  Chronic leg pain/venous insufficiency/recent DVT - patient prescribed Norco 5-325 mg 60 tablets last month, reports worsening medial heel pain bilaterally over the past week, ran out of pain pills 2 weeks ago (states taking 2-3 Norco per day), wearing compression stockings daily, taking Gabapentin 600 mg TID which is providing minimal pain relief  Note - story does not match # of pills given (gave 94 Norco on 5/24 but ran out on 6/7 per his story, he should not have ran out this early).   Debility - duaghter [name] is concerned about his well-being, she is financial power of attorney, he has had multiple falls over the past year, he lives alone, previously had nursing assistance in home however no longer has, patient states that the nursing staff said he no longer needed assistance approximately 6 months ago, he is able to feed and bath self, daughter is also concerned about his possible dementia/worsening memory.    Review of Systems  Constitutional: Negative for fever, chills and fatigue.  Respiratory: Negative for cough and shortness of breath.   Cardiovascular: Positive for leg swelling. Negative for chest pain.  Gastrointestinal: Negative for nausea, vomiting and diarrhea.       Objective:   Physical Exam Vitals: reviewed Gen: pleasant male, NAD, accompanied by daughter Cardiac: RRR, S1 and S2 present no murmur, no heaves/thrills Resp: CTAB, normal effort Bilaterally legs - DP pulses and PT pulses 2+ bilaterally, no skin breakdown, feet and legs are warm and and mildly erythematous to the touch (redness mostly over posterior right heel, much improved compared to previous examination), varicose veins present, exam not consistent with acute infection  Normal clock drawing and 3 item recall MMSE performed by MS3 - score of  26/30 (missed point for repeating "no ifs, and, or but's", not able to read and obey command or write a sentence, of note he is illiterate, 2nd grade education).      Assessment & Plan:  Please see problem specific assessment and plan.

## 2015-02-23 ENCOUNTER — Telehealth: Payer: Self-pay | Admitting: Family Medicine

## 2015-02-23 DIAGNOSIS — R531 Weakness: Secondary | ICD-10-CM | POA: Insufficient documentation

## 2015-02-23 DIAGNOSIS — R5381 Other malaise: Secondary | ICD-10-CM

## 2015-02-23 NOTE — Assessment & Plan Note (Signed)
Stable with continued leg pain. -referral back to pharmacy clinic for ABI -consider unna boot if arterial flow normal -continue compression stockings daily

## 2015-02-23 NOTE — Telephone Encounter (Signed)
Daughter called back after speaking to Dr. Ree Kida and now has some additional questions. jw

## 2015-02-23 NOTE — Assessment & Plan Note (Signed)
Daughter reports concern for memory loss. -clock and 3 item recall normal -MMSE score 26/30 (very appropriate given that the patient is illiterate) -will continue to monitor

## 2015-02-23 NOTE — Assessment & Plan Note (Deleted)
Daughter reports concern for memory loss. -clock and 3 item recall normal -MMSE score 26/30 (very appropriate given that the patient is illiterate) -will continue to monitor

## 2015-02-23 NOTE — Telephone Encounter (Signed)
Returned call. Daughter [name] is concerned that patient may transfer power of attorney over to other family members.  I informed the daughter that we can look into determining capacity if needed in the future.

## 2015-02-23 NOTE — Telephone Encounter (Signed)
I spoke with patient. He still drives 79-21 minutes to visit family members. He also drives to the store. I notified him that I will be contacting the DMV to have them perform a driving evaluation.

## 2015-02-23 NOTE — Assessment & Plan Note (Signed)
Daughter reports generalized debility that has been progressive. Multiple falls over the past year. She is concerned for his safety as he lives alone. Previously had nursing help in home. Daughter is medical and financial POA.  -patient/daughter to call the office with the name of the previous nursing help -if unable to find the name of the service will make home health referral.

## 2015-02-23 NOTE — Assessment & Plan Note (Signed)
Patient has persistent pain. Ran out of pain medications early as using more than 2 per day. Reports compliance with compression stockings. No evidence of acute infection. -will send back to pharmacy for ABI to rule out arterial insufficiency (previously unable to complete due to acute DVT) -if normal arterial flow consider unna boots (start with right side) -continue daily compression stockings -will increase Norco to 75 tablets -continue Gabapentin 600 mg TID

## 2015-02-24 ENCOUNTER — Encounter: Payer: Self-pay | Admitting: Family Medicine

## 2015-02-24 NOTE — Telephone Encounter (Signed)
Name of the last nurse was  [employer] sure what company Dad had a complete meltdown about taking a driving test. He was adamant that he would NOT be taking any driving test. He has also made threats to take daughter off power of attorney. Daughter was upset and crying because father is very mean to daughter.

## 2015-02-25 NOTE — Telephone Encounter (Signed)
Attempted to contact [employer]. No answer. Left message to return my call.

## 2015-02-28 NOTE — Telephone Encounter (Signed)
Attempted to contact [employer] with no answer.

## 2015-03-01 ENCOUNTER — Telehealth: Payer: Self-pay | Admitting: Family Medicine

## 2015-03-01 ENCOUNTER — Ambulatory Visit: Payer: PPO | Admitting: Family Medicine

## 2015-03-01 NOTE — Telephone Encounter (Signed)
Gi-Gi returned call. She previously worked for [employer]. I will place an order for home health services. I will also contact the patient to make him and his daughter aware.

## 2015-03-01 NOTE — Telephone Encounter (Signed)
Daughter called and said that it is urgent that she talks to Dr. Ree Kida concerning her father. jw

## 2015-03-01 NOTE — Telephone Encounter (Signed)
Spoke with daughter, informed her that Dr. Ree Kida was out of the office this afternoon and would not be able to immediately return her call. Daughter states "just tell him there is an issue with my father's medications and have him call as soon as he's able to.'  Tried to obtain more information but daughter only wants to speak with Dr. Ree Kida.

## 2015-03-02 ENCOUNTER — Encounter: Payer: Self-pay | Admitting: Clinical

## 2015-03-02 NOTE — Telephone Encounter (Signed)
Daughter talked with the home health care agency. Was told it would be a $25 co pay per visit Dad says he is not paying that So whats next?

## 2015-03-02 NOTE — Progress Notes (Signed)
CSW informed by Alvis Lemmings that pt and daughter are refusing HH due to the copay.   Hunt Oris, MSW, [place]

## 2015-03-02 NOTE — Telephone Encounter (Signed)
Returned call. Daughter thought patient had misplaced pain medications however they found them this AM.

## 2015-03-02 NOTE — Progress Notes (Signed)
Home health referral has been sent to [facility] as [facility] is unable to accept any nursing referrals from the community.  Hunt Oris, MSW, [place]

## 2015-03-03 NOTE — Telephone Encounter (Signed)
CSW contacted daughter and informed her that unfortunately there are no other options if pt is unwilling to pay the $25 co-pay. CSW inquired whether pt has applied for Medicaid. Daughter states he applied in 2011 however he did not qualify because of his assets. Daughter states she's interested in speaking with an attorney regarding pt qualifying for Medicaid. CSW provided daughter with the contact information for the [employer] and encouraged her to contact them for assistance. Daughter appreciative.

## 2015-03-03 NOTE — Telephone Encounter (Signed)
[name] Elliott can you please contact the daughter and offer options for home health.

## 2015-03-08 ENCOUNTER — Telehealth: Payer: Self-pay | Admitting: Family Medicine

## 2015-03-08 ENCOUNTER — Telehealth: Payer: Self-pay | Admitting: Clinical

## 2015-03-08 ENCOUNTER — Encounter: Payer: Self-pay | Admitting: Family Medicine

## 2015-03-08 NOTE — Telephone Encounter (Signed)
Returned the call from Mr. Marcha Dutton. Informed him that I would not be able to discuss any of Mr. Hustead medical care until he ([name] Elliott) signs a medical form at our office stating that we can speak to Mr. Marcha Dutton.

## 2015-03-08 NOTE — Telephone Encounter (Signed)
Daughter called because her father received the packet from [facility] for her father and his PCP to fill out. The patient is refusing to fill this out and also take the driving test. According to the [facility] form if this is not filled out and return within 30 days they will suspend his license. The patient also told the daughter that he will find another doctor who will fill this out for. The patient also stated that he will never be coming back here to see Dr. Valentina Lucks or Dr. Ree Kida and he is removing or trying to remove his daughter's power of attorney so that she can not stop him from driving or telling his doctors anything. She would like to speak to Dr. Ree Kida and is also going to be talking to [place] our Education officer, museum and the lawyer to see what her rights are. Blima Rich

## 2015-03-08 NOTE — Telephone Encounter (Signed)
CSW re

## 2015-03-08 NOTE — Telephone Encounter (Signed)
Spoke with Mr. Cappiello and informed him of the information below.

## 2015-03-08 NOTE — Telephone Encounter (Signed)
A Mr. Alyssa Grove called for patient today stating patient is very upset that he received paperwork from the [facility].  Letter stated patient would will need to take a driving test and have the physical form completed by his doctor.  Pt does not know why this is happening or contacted the DMV.  Advised Mr. Marcha Dutton that if patient did not sign a form to release information to him, then I can not speak to him regarding the patient.  He requested that patient's PCP give him a call at [phone].  Mr. Marcha Dutton is married to patient's niece.  Derl Barrow, RN

## 2015-03-08 NOTE — Telephone Encounter (Signed)
I have discussed this issue with Hunt Oris and agree with her assessment and recommendations.   Dossie Arbour MD

## 2015-03-08 NOTE — Telephone Encounter (Signed)
CSW received a call from pts daughter informing CSW that pt received a letter from the [facility] stating that he will need to take a driving test. Daughter states pt is very upset, is not agreeable to take the driving test rather plans to hire an attorney. Daughter states pt is also not agreeable to return to the clinic. CSW encouraged daughter to give pt space and contact the [employer] to obtain some education and guidance. Per daughter pt states he will be removing daughter (only living child) off of HCPOA and POA paperwork. CSW validated concerns daughter has with this and encouraged her to remain calm & contact the resource given. Daughter appreciative of support.  Hunt Oris, MSW, [place]

## 2015-03-08 NOTE — Progress Notes (Signed)
I have reviewed [name] from from 11/22/12 which states that his daughter [name] is his designated HCPOA.

## 2015-03-22 ENCOUNTER — Ambulatory Visit: Payer: PPO | Admitting: Pharmacist

## 2015-03-29 ENCOUNTER — Ambulatory Visit: Payer: PPO | Admitting: Family Medicine

## 2015-04-19 ENCOUNTER — Other Ambulatory Visit: Payer: Self-pay | Admitting: Internal Medicine

## 2015-04-20 ENCOUNTER — Telehealth: Payer: Self-pay | Admitting: Family Medicine

## 2015-04-20 NOTE — Telephone Encounter (Signed)
Pt's guardian and POA, [name] is calling because she would like to know if any medical records requests were sent for the pt as she was informed that the pt is switching doctors but he will not tell her as to whom. She would also like to directly speak with Dr. Ree Kida about the pt. Thank you, Fonda Kinder, ASA

## 2015-04-20 NOTE — Telephone Encounter (Signed)
Returned call to [employer]. Informed her that to the best of my knowledge no other physician has requested records. Daughter states that [name] Elliott has sought care with another physician, however will not tell her the name of the new physician. She is concerned about his care. I offered support but did tell her that it is his right to seek care elsewhere.

## 2015-05-03 ENCOUNTER — Ambulatory Visit (INDEPENDENT_AMBULATORY_CARE_PROVIDER_SITE_OTHER): Payer: PPO | Admitting: Cardiovascular Disease

## 2015-05-03 ENCOUNTER — Encounter: Payer: Self-pay | Admitting: Cardiovascular Disease

## 2015-05-03 VITALS — BP 110/58 | HR 81 | Ht 64.0 in | Wt 150.1 lb

## 2015-05-03 DIAGNOSIS — I10 Essential (primary) hypertension: Secondary | ICD-10-CM

## 2015-05-03 DIAGNOSIS — I2510 Atherosclerotic heart disease of native coronary artery without angina pectoris: Secondary | ICD-10-CM

## 2015-05-03 MED ORDER — POTASSIUM CHLORIDE CRYS ER 20 MEQ PO TBCR: 20.0000 meq | EXTENDED_RELEASE_TABLET | Freq: Two times a day (BID) | ORAL | Status: DC

## 2015-05-03 MED ORDER — ISOSORBIDE MONONITRATE ER 60 MG PO TB24: 60.0000 mg | ORAL_TABLET | Freq: Every morning | ORAL | Status: DC

## 2015-05-03 NOTE — Progress Notes (Signed)
Cardiology Office Note   Date:  05/03/2015   ID:  [name] Elliott, DOB 03-Feb-1928, MRN [id number]  PCP:  Lupita Dawn, MD  Cardiologist:   Thayer Headings, MD   Chief Complaint  Patient presents with  . Atherosclerosis   Problem list:  1. Moderate coronary artery disease 2. Dementia 3. Peripheral vascular disease 4. CVA 5.   History of Present Illness:  81 year old gentleman with a history of moderate diffuse coronary artery disease. We treated medically. The left anterior descending arteries/ 1st diagonal stenosis has not was not suitable for PCI.  He complains of being sick in general is weak sensation. He also complains of some left arm pain. When I saw him last month. He was having some episodes of chest pain. We were trying to decide whether or not these were due to angina. I asked him to take nitroglycerin when he had these episodes of pain. At times the nitroglycerin helps him and at other times the nitroglycerin did not do anything. When he walks up a hill in his backyard he has significant shortness breath and this "sick feeling".  He has had a partial shoulder replacement for a shoulder fracture. He has had chronic shoulder pain since that time.  He has chronic knee pain and is considering knee surgery. His office visit was for the purpose of preoperative evaluation.  Sept. 17, 2013- He has no cardiac complaints. He denies any chest pain or dyspnea. He still has some left shoulder stiffness from his surgery February 05, 2012. He has had some wheezing and was recently started on steroids and an antibiotic.  He also ws diagnosed with peripheral neurophy He was prescribed gabapentin but his daughter did not fill the medication because it was too expensive.  She bought some over-the-counter vitamin B12 which seems to be helping a little bit.  November 26, 2012:  [name] Elliott was recently hospitalized last week for peptic ulcer Disease. He had vomiting of coffee-ground  emesis. He also had blood in his stool. [facility] discontinued his Gabriel Earing powders and his Fosamax. He seems to be doing well from a cardiac standpoint. No angina.  He is having trouble with leg pain.   Jan. 20, 2015:  No CP. He has lots of fatigue especially if he tries to walk any distance.  April 21, 2014,   November 02, 2014;  [name] Elliott is a 81 y.o. male who presents for his coronary artery disease.   He was seen by Richardson Dopp several weeks ago. He had a stroke back in November. We've placed a event monitor looking for atrial fib . There was no evidence of atrial fibrillation on the monitor. There is some mention of atrial fibrillation in past charts but we've not been able to locate any confirmation of that.  He complains of leg pain today.   He keeps his legs elevated 3-4 hours a day.  Has seen [facility] and they had no solutions for his leg pain.    No additional stroke symptoms.  He was seen with his granddaughter this am.  Aug. 30, 2016:  He has been diagnosed with DVTs.  Has been on xarelto. Needs to have knee  Replacement .    Past Medical History  Diagnosis Date  . Constipation, chronic   . Sebaceous cyst   . Other malaise and fatigue   . Pain in joint, shoulder region     Has chronic shoulder pain  . Persistent disorder of initiating or maintaining sleep   .  Thrombocytopenia, unspecified   . Vascular dementia without behavioral disturbance     with periods of amnesia  . Other B-complex deficiencies   . Hypothyroidism   . Pain in limb   . Esophageal reflux   . Unspecified essential hypertension   . Arthropathy, unspecified, site unspecified   . Pure hypercholesterolemia   . Orthostasis   . PVC's (premature ventricular contractions)   . S/P cardiac cath 08/08/11    mild to moderate CAD primarily in the LAD. None are obstructive and appear stable from prior cath in 2007; managed medically  . Arthritis   . CAD (coronary artery disease)     last cath in 2012.  Managed medically-some blockages  . Headache(784.0)   . 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 MRI Nov '11 - tendonosis and partial tear. Left shoulder surgery Jan '12 for partial shoulder replacement. [name])   . Blood dyscrasia     thromboctopenia  . Pneumonia 1980's?; 2011  . History of stomach ulcers 11/2012  . Chronic lower back pain   . Atrial fibrillation   . Diabetes mellitus without complication   . DVT (deep venous thrombosis)   . Stroke     Past Surgical History  Procedure Laterality Date  . Hand surgery Right     crush injury, right fifth digit contracture, limited  motion  . Lumbar spine surgery  2007    Dr Philip Aspen ([place])  . Knee surgery Left 1991    "did it twice in 1 wk" (02/17/2013)  . Cataract extraction Left 2009  . Shoulder open rotator cuff repair Left 8.30.2011  . US echocardiography  02-28-2010    Est EF 50-55%  . Cardiac catheterization  08/2011  . Shoulder hemi-arthroplasty    . Finger amputation Right     pinky finger  . Hardware removal  02/05/2012    Procedure: HARDWARE REMOVAL;  Surgeon: Johnny Bridge, MD;  Location: [facility];  Service: Orthopedics;  Laterality: Left;  . Reverse shoulder arthroplasty  02/05/2012    Procedure: REVERSE SHOULDER ARTHROPLASTY;  Surgeon: Johnny Bridge, MD;  Location: [place];  Service: Orthopedics;  Laterality: Left;  . Back surgery    . Esophagogastroduodenoscopy N/A 11/19/2012    Procedure: ESOPHAGOGASTRODUODENOSCOPY (EGD);  Surgeon: Jerene Bears, MD;  Location: [facility];  Service: Gastroenterology;  Laterality: N/A;     Current Outpatient Prescriptions  Medication Sig Dispense Refill  . ACCU-CHEK SOFTCLIX LANCETS lancets Use as instructed to test blood sugar once daily dx: 250.42 200 each 1  . aspirin EC 81 MG tablet Take 81 mg by mouth daily.     . Blood Glucose Calibration (ACCU-CHEK AVIVA) SOLN Use to calibrate blood glucose meter dx 250.42 1 each 0  . diclofenac sodium  (VOLTAREN) 1 % GEL Apply 1 application topically 3 (three) times daily as needed (knee pain).    . furosemide (LASIX) 40 MG tablet Take 40 mg by mouth daily.     Marland Kitchen gabapentin (NEURONTIN) 300 MG capsule Take 2 capsules by mouth 3 times a day 540 capsule 0  . glucose blood (ACCU-CHEK AVIVA PLUS) test strip Use as instructed to test blood sugar once daily dx: 250.42 200 each 2  . HYDROcodone-acetaminophen (NORCO) 5-325 MG per tablet Take 1 tablet by mouth every 6 (six) hours as needed for moderate pain. 75 tablet 0  . isosorbide mononitrate (IMDUR) 60 MG 24 hr tablet Take 1 tablet by mouth every morning 90 tablet 0  . omeprazole (  PRILOSEC) 20 MG capsule Take 1 capsule (20 mg total) by mouth 2 (two) times daily before a meal. 180 capsule 2  . potassium chloride SA (K-DUR,KLOR-CON) 20 MEQ tablet Take 1 tablet (20 mEq total) by mouth 2 (two) times daily. 180 tablet 1  . Rivaroxaban (XARELTO) 15 MG TABS tablet Take 1 tablet (15 mg total) by mouth daily. 10 tablet 0  . senna-docusate (SENOKOT-S) 8.6-50 MG per tablet Take 2 tablets by mouth daily. 30 tablet 2  . [DISCONTINUED] Hyoscyamine Sulfate (HYOMAX-DT) 0.375 MG TBCR Take 1 tab bid for 5 days, then prn abd pain 30 each 1   No current facility-administered medications for this visit.    Allergies:   Crestor    Social History:  The patient  reports that he quit smoking about 36 years ago. His smoking use included Cigars. His smokeless tobacco use includes Chew. He reports that he does not drink alcohol or use illicit drugs.   Family History:  The patient's family history includes Breast cancer in his mother; Cancer in his brother and mother; Diabetes in his daughter, mother, and sister; Kidney disease in his father; Stroke in his brother. There is no history of Colon cancer or Heart attack.    ROS:  Please see the history of present illness.    Review of Systems: Constitutional:  denies fever, chills, diaphoresis, appetite change and fatigue.    HEENT: denies photophobia, eye pain, redness, hearing loss, ear pain, congestion, sore throat, rhinorrhea, sneezing, neck pain, neck stiffness and tinnitus.  Respiratory: denies SOB, DOE, cough, chest tightness, and wheezing.  Cardiovascular: denies chest pain, palpitations and leg swelling.  Gastrointestinal: denies nausea, vomiting, abdominal pain, diarrhea, constipation, blood in stool.  Genitourinary: denies dysuria, urgency, frequency, hematuria, flank pain and difficulty urinating.  Musculoskeletal: admits to  Leg pain    Skin: admits to  Wound on his legs   Neurological: denies dizziness, seizures, syncope, weakness, light-headedness, numbness and headaches.   Hematological: denies adenopathy, easy bruising, personal or family bleeding history.  Psychiatric/ Behavioral: denies suicidal ideation, mood changes, confusion, nervousness, sleep disturbance and agitation.       All other systems are reviewed and negative.    PHYSICAL EXAM: VS:  BP 110/58 mmHg  Pulse 81  Ht 5\' 4"  (1.626 m)  Wt 68.094 kg (150 lb 1.9 oz)  BMI 25.76 kg/m2  SpO2 96% , BMI Body mass index is 25.76 kg/(m^2). GEN: Well nourished, well developed, in no acute distress HEENT: normal Neck: no JVD, carotid bruits, or masses Cardiac: RRR; soft systolic murmur  rubs, or gallops, 1+ bilateral leg edema  Respiratory:  clear to auscultation bilaterally, normal work of breathing GI: soft, nontender, nondistended, + BS MS: legs have hair loss.  Appear to have chronic ischemic changes.  Several sores. 1 + leg edema  Skin: warm and dry, no rash Neuro:  Moves slow.  Was able to get up on the table himself Psych: ? Mild dementia , slow speech    EKG:  EKG is not ordered today. The ekg ordered today demonstrates    Recent Labs: 08/01/2014: ALT 10 12/12/2014: BUN 25*; Creatinine, Ser 1.67*; Hemoglobin 13.2; Platelets 241; Potassium 4.2; Sodium 133*    Lipid Panel    Component Value Date/Time   CHOL 155  08/02/2014 0334   TRIG 46 08/02/2014 0334   HDL 59 08/02/2014 0334   CHOLHDL 2.6 08/02/2014 0334   VLDL 9 08/02/2014 0334   LDLCALC 87 08/02/2014 0334   LDLDIRECT 140.6  09/25/2013 1030      Wt Readings from Last 3 Encounters:  05/03/15 68.094 kg (150 lb 1.9 oz)  02/22/15 70.897 kg (156 lb 4.8 oz)  01/25/15 69.264 kg (152 lb 11.2 oz)      Other studies Reviewed: Additional studies/ records that were reviewed today include:  Review of the above records demonstrates:    ASSESSMENT AND PLAN:  1. Moderate coronary artery disease - he's not having any episodes of angina.  2. Dementia  3. Peripheral vascular disease- he has chronic leg pain. He has seen Dr. Trula Slade and there is not much more that can be done for his leg pain.   4. CVA - he's not had any recurrent strokes. There was no evidence of atrial fib on the 30 day event monitor. We'll continue to follow.  5. DVT:  He was found have a deep vein thrombosis recent. He is now on Xarelto. I've cautioned the patient and his family that he should be very careful and avoid falls.  6. Knee arthritis: The patient may need to have knee replacement. I have told him that he would be at moderate to high risk for knee replacement given his heart disease and peripheral vascular disease and underlying medical issues.  Current medicines are reviewed at length with the patient today.  The patient does not have concerns regarding medicines.  The following changes have been made:  no change   Disposition:   FU with me in 6  months     Signed, Nahser, Wonda Cheng, MD  05/03/2015 3:41 PM    [facility] [address], [place], [place]  [address] Phone: [phone]; Fax: [phone]

## 2015-05-03 NOTE — Patient Instructions (Signed)
Medication Instructions:  Your physician recommends that you continue on your current medications as directed. Please refer to the Current Medication list given to you today.   Labwork: None Ordered   Testing/Procedures: None Ordered   Follow-Up: Your physician wants you to follow-up in: 6 months with Dr. Nahser.  You will receive a reminder letter in the mail two months in advance. If you don't receive a letter, please call our office to schedule the follow-up appointment.     

## 2015-06-11 ENCOUNTER — Other Ambulatory Visit: Payer: Self-pay | Admitting: Internal Medicine

## 2015-06-13 ENCOUNTER — Other Ambulatory Visit: Payer: Self-pay

## 2015-06-13 MED ORDER — ISOSORBIDE MONONITRATE ER 60 MG PO TB24: 60.0000 mg | ORAL_TABLET | Freq: Every morning | ORAL | Status: DC

## 2015-06-18 ENCOUNTER — Emergency Department (EMERGENCY_DEPARTMENT_HOSPITAL)
Admit: 2015-06-18 | Discharge: 2015-06-18 | Disposition: A | Discharge status: Home / Self Care | Payer: PPO | Attending: Emergency Medicine | Admitting: Emergency Medicine

## 2015-06-18 ENCOUNTER — Emergency Department (HOSPITAL_COMMUNITY)
Admission: EM | Admit: 2015-06-18 | Discharge: 2015-06-18 | Discharge status: Against Medical Advice | Payer: PPO | Attending: Emergency Medicine | Admitting: Emergency Medicine

## 2015-06-18 ENCOUNTER — Encounter (HOSPITAL_COMMUNITY): Payer: Self-pay | Admitting: Emergency Medicine

## 2015-06-18 ENCOUNTER — Emergency Department (HOSPITAL_COMMUNITY): Payer: PPO

## 2015-06-18 DIAGNOSIS — K219 Gastro-esophageal reflux disease without esophagitis: Secondary | ICD-10-CM | POA: Diagnosis not present

## 2015-06-18 DIAGNOSIS — E039 Hypothyroidism, unspecified: Secondary | ICD-10-CM | POA: Insufficient documentation

## 2015-06-18 DIAGNOSIS — Z862 Personal history of diseases of the blood and blood-forming organs and certain disorders involving the immune mechanism: Secondary | ICD-10-CM | POA: Diagnosis not present

## 2015-06-18 DIAGNOSIS — M1990 Unspecified osteoarthritis, unspecified site: Secondary | ICD-10-CM | POA: Insufficient documentation

## 2015-06-18 DIAGNOSIS — Z87891 Personal history of nicotine dependence: Secondary | ICD-10-CM | POA: Insufficient documentation

## 2015-06-18 DIAGNOSIS — E7800 Pure hypercholesterolemia, unspecified: Secondary | ICD-10-CM | POA: Diagnosis not present

## 2015-06-18 DIAGNOSIS — G8929 Other chronic pain: Secondary | ICD-10-CM | POA: Diagnosis not present

## 2015-06-18 DIAGNOSIS — E119 Type 2 diabetes mellitus without complications: Secondary | ICD-10-CM | POA: Diagnosis not present

## 2015-06-18 DIAGNOSIS — M7989 Other specified soft tissue disorders: Secondary | ICD-10-CM

## 2015-06-18 DIAGNOSIS — Z86718 Personal history of other venous thrombosis and embolism: Secondary | ICD-10-CM | POA: Diagnosis not present

## 2015-06-18 DIAGNOSIS — Z7982 Long term (current) use of aspirin: Secondary | ICD-10-CM | POA: Insufficient documentation

## 2015-06-18 DIAGNOSIS — Z8673 Personal history of transient ischemic attack (TIA), and cerebral infarction without residual deficits: Secondary | ICD-10-CM | POA: Insufficient documentation

## 2015-06-18 DIAGNOSIS — M79609 Pain in unspecified limb: Secondary | ICD-10-CM

## 2015-06-18 DIAGNOSIS — M25562 Pain in left knee: Secondary | ICD-10-CM | POA: Insufficient documentation

## 2015-06-18 DIAGNOSIS — I10 Essential (primary) hypertension: Secondary | ICD-10-CM | POA: Insufficient documentation

## 2015-06-18 DIAGNOSIS — I2510 Atherosclerotic heart disease of native coronary artery without angina pectoris: Secondary | ICD-10-CM | POA: Diagnosis not present

## 2015-06-18 DIAGNOSIS — Z8701 Personal history of pneumonia (recurrent): Secondary | ICD-10-CM | POA: Insufficient documentation

## 2015-06-18 LAB — BASIC METABOLIC PANEL
ANION GAP: 13 (ref 5–15)
BUN: 21 mg/dL — ABNORMAL HIGH (ref 6–20)
CO2: 24 mmol/L (ref 22–32)
Calcium: 8.8 mg/dL — ABNORMAL LOW (ref 8.9–10.3)
Chloride: 93 mmol/L — ABNORMAL LOW (ref 101–111)
Creatinine, Ser: 1.56 mg/dL — ABNORMAL HIGH (ref 0.61–1.24)
GFR calc non Af Amer: 39 mL/min — ABNORMAL LOW (ref >60)
GFR, EST AFRICAN AMERICAN: 45 mL/min — AB (ref >60)
GLUCOSE: 256 mg/dL — AB (ref 65–99)
POTASSIUM: 3.5 mmol/L (ref 3.5–5.1)
Sodium: 130 mmol/L — ABNORMAL LOW (ref 135–145)

## 2015-06-18 LAB — I-STAT TROPONIN, ED: TROPONIN I, POC: 0 ng/mL (ref 0.00–0.08)

## 2015-06-18 LAB — URINALYSIS, ROUTINE W REFLEX MICROSCOPIC
Bilirubin Urine: NEGATIVE
Glucose, UA: 250 mg/dL — AB
KETONES UR: NEGATIVE mg/dL
LEUKOCYTES UA: NEGATIVE
NITRITE: NEGATIVE
PH: 6.5 (ref 5.0–8.0)
Protein, ur: NEGATIVE mg/dL
SPECIFIC GRAVITY, URINE: 1.016 (ref 1.005–1.030)
UROBILINOGEN UA: 1 mg/dL (ref 0.0–1.0)

## 2015-06-18 LAB — CBC WITH DIFFERENTIAL/PLATELET
BASOS ABS: 0 10*3/uL (ref 0.0–0.1)
Basophils Relative: 0 %
Eosinophils Absolute: 0 10*3/uL (ref 0.0–0.7)
Eosinophils Relative: 0 %
HEMATOCRIT: 38.3 % — AB (ref 39.0–52.0)
HEMOGLOBIN: 12.9 g/dL — AB (ref 13.0–17.0)
LYMPHS PCT: 13 %
Lymphs Abs: 1.2 10*3/uL (ref 0.7–4.0)
MCH: 28.5 pg (ref 26.0–34.0)
MCHC: 33.7 g/dL (ref 30.0–36.0)
MCV: 84.7 fL (ref 78.0–100.0)
Monocytes Absolute: 1.2 10*3/uL — ABNORMAL HIGH (ref 0.1–1.0)
Monocytes Relative: 13 %
NEUTROS ABS: 6.6 10*3/uL (ref 1.7–7.7)
NEUTROS PCT: 74 %
Platelets: 220 10*3/uL (ref 150–400)
RBC: 4.52 MIL/uL (ref 4.22–5.81)
RDW: 14.7 % (ref 11.5–15.5)
WBC: 9 10*3/uL (ref 4.0–10.5)

## 2015-06-18 LAB — URINE MICROSCOPIC-ADD ON

## 2015-06-18 MED ORDER — OXYCODONE-ACETAMINOPHEN 5-325 MG PO TABS
1.0000 | ORAL_TABLET | Freq: Once | ORAL | Status: AC
  Administered 2015-06-18: 1 via ORAL
  Filled 2015-06-18: qty 1

## 2015-06-18 NOTE — Progress Notes (Signed)
VASCULAR LAB PRELIMINARY  PRELIMINARY  PRELIMINARY  PRELIMINARY  Bilateral venous duplex completed.    Preliminary report:  There is no DVT or SVT noted in the bilateral lower extremities.   Jerriah Ines, RVT 06/18/2015, 5:10 PM

## 2015-06-18 NOTE — ED Notes (Signed)
Pt left AMA with family. Pt left via wheelchair. Risks and benefits explained to pt. Pt a/o x4, NAD noted at this time.

## 2015-06-18 NOTE — Discharge Instructions (Signed)

## 2015-06-18 NOTE — ED Notes (Addendum)
Patient c/o L knee pain since last night, patient wearing black knee brace. Patient scheduled to have L knee replacement November 8 with Dr. Mardelle Matte.  Patient also c/o dyspnea at rest over the past few weeks - saw Cardiologist in August and everything checked out okay then. Pt A&O x 4, respirations e/u, NAD at this time.

## 2015-06-18 NOTE — ED Provider Notes (Signed)
CSN: [id number]     Arrival date & time 06/18/15  1438 History   First MD Initiated Contact with Patient 06/18/15 1507     Chief Complaint  Patient presents with  . Knee Pain     (Consider location/radiation/quality/duration/timing/severity/associated sxs/prior Treatment) Patient is a 81 y.o. male presenting with knee pain.  Knee Pain Location:  Knee Injury: no   Knee location:  L knee Pain details:    Quality:  Aching   Radiates to:  Does not radiate   Severity:  Severe   Onset quality:  Gradual   Duration: chronically but acutely worsening last night.   Timing:  Constant Relieved by:  Nothing Worsened by:  Bearing weight, flexion and extension Associated symptoms: no back pain, no fever, no itching, no numbness and no swelling     Past Medical History  Diagnosis Date  . Constipation, chronic   . Sebaceous cyst   . Other malaise and fatigue   . Pain in joint, shoulder region     Has chronic shoulder pain  . Persistent disorder of initiating or maintaining sleep   . Thrombocytopenia, unspecified ([place])   . Vascular dementia without behavioral disturbance     with periods of amnesia  . Other B-complex deficiencies   . Hypothyroidism   . Pain in limb   . Esophageal reflux   . Unspecified essential hypertension   . Arthropathy, unspecified, site unspecified   . Pure hypercholesterolemia   . Orthostasis   . PVC's (premature ventricular contractions)   . S/P cardiac cath 08/08/11    mild to moderate CAD primarily in the LAD. None are obstructive and appear stable from prior cath in 2007; managed medically  . Arthritis   . CAD (coronary artery disease)     last cath in 2012. Managed medically-some blockages  . Headache(784.0)   . 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 MRI Nov '11 - tendonosis and partial tear. Left shoulder surgery Jan '12 for partial shoulder replacement. [name])   . Blood dyscrasia     thromboctopenia  . Pneumonia  1980's?; 2011  . History of stomach ulcers 11/2012  . Chronic lower back pain   . Atrial fibrillation ([place])   . Diabetes mellitus without complication ([place])   . DVT (deep venous thrombosis) ([place])   . Stroke [facility])    Past Surgical History  Procedure Laterality Date  . Hand surgery Right     crush injury, right fifth digit contracture, limited  motion  . Lumbar spine surgery  2007    Dr Philip Aspen ([place])  . Knee surgery Left 1991    "did it twice in 1 wk" (02/17/2013)  . Cataract extraction Left 2009  . Shoulder open rotator cuff repair Left 8.30.2011  . US echocardiography  02-28-2010    Est EF 50-55%  . Cardiac catheterization  08/2011  . Shoulder hemi-arthroplasty    . Finger amputation Right     pinky finger  . Hardware removal  02/05/2012    Procedure: HARDWARE REMOVAL;  Surgeon: Johnny Bridge, MD;  Location: [facility];  Service: Orthopedics;  Laterality: Left;  . Reverse shoulder arthroplasty  02/05/2012    Procedure: REVERSE SHOULDER ARTHROPLASTY;  Surgeon: Johnny Bridge, MD;  Location: [place];  Service: Orthopedics;  Laterality: Left;  . Back surgery    . Esophagogastroduodenoscopy N/A 11/19/2012    Procedure: ESOPHAGOGASTRODUODENOSCOPY (EGD);  Surgeon: Jerene Bears, MD;  Location: [place];  Service: Gastroenterology;  Laterality: N/A;  Family History  Problem Relation Age of Onset  . Breast cancer Mother   . Cancer Mother   . Diabetes Mother   . Cancer Brother     throat  . Stroke Brother   . Diabetes Daughter   . Colon cancer Neg Hx   . Heart attack Neg Hx   . Kidney disease Father   . Diabetes Sister    Social History  Substance Use Topics  . Smoking status: Former Smoker    Types: Cigars    Quit date: 09/03/1978  . Smokeless tobacco: Current User    Types: Chew     Comment: 02/17/2013 "I aien't smoked a cigar in 20-30 yr"  . Alcohol Use: No    Review of Systems  Constitutional: Negative for fever.  Musculoskeletal: Negative for back pain.  Skin:  Negative for itching.  All other systems reviewed and are negative.     Allergies  Crestor  Home Medications   Prior to Admission medications   Medication Sig Start Date End Date Taking? Authorizing Provider  ACCU-CHEK SOFTCLIX LANCETS lancets Use as instructed to test blood sugar once daily dx: 250.42 02/19/14   Venia Carbon, MD  aspirin EC 81 MG tablet Take 81 mg by mouth daily.     Historical Provider, MD  Blood Glucose Calibration (ACCU-CHEK AVIVA) SOLN Use to calibrate blood glucose meter dx 250.42 02/19/14   Venia Carbon, MD  diclofenac sodium (VOLTAREN) 1 % GEL Apply 1 application topically 3 (three) times daily as needed (knee pain).    Historical Provider, MD  furosemide (LASIX) 40 MG tablet Take 40 mg by mouth daily.     Historical Provider, MD  gabapentin (NEURONTIN) 300 MG capsule Take 2 capsules by mouth 3 times a day 04/19/15   Venia Carbon, MD  glucose blood (ACCU-CHEK AVIVA PLUS) test strip Use as instructed to test blood sugar once daily dx: 250.42 02/19/14   Venia Carbon, MD  HYDROcodone-acetaminophen (NORCO) 5-325 MG per tablet Take 1 tablet by mouth every 6 (six) hours as needed for moderate pain. 02/22/15   Lupita Dawn, MD  isosorbide mononitrate (IMDUR) 60 MG 24 hr tablet Take 1 tablet (60 mg total) by mouth every morning. 06/13/15   Thayer Headings, MD  omeprazole (PRILOSEC) 20 MG capsule Take 1 capsule by mouth twice a day before a meal 06/13/15   Venia Carbon, MD  potassium chloride SA (K-DUR,KLOR-CON) 20 MEQ tablet Take 1 tablet (20 mEq total) by mouth 2 (two) times daily. 05/03/15   Thayer Headings, MD  Rivaroxaban (XARELTO) 15 MG TABS tablet Take 1 tablet (15 mg total) by mouth daily. 02/02/15   Lupita Dawn, MD  senna-docusate (SENOKOT-S) 8.6-50 MG per tablet Take 2 tablets by mouth daily. 12/28/14   Lupita Dawn, MD   BP 117/60 mmHg  Pulse 79  Temp(Src) 98.7 F (37.1 C) (Oral)  Resp 17  Ht 5\' 1"  (1.549 m)  Wt 150 lb (68.04 kg)  BMI 28.36  kg/m2  SpO2 97% Physical Exam  Constitutional: He is oriented to person, place, and time. He appears well-developed and well-nourished.  HENT:  Head: Normocephalic and atraumatic.  Eyes: Conjunctivae and EOM are normal.  Neck: Normal range of motion. Neck supple.  Cardiovascular: Normal rate, regular rhythm and normal heart sounds.   Pulmonary/Chest: Effort normal and breath sounds normal. No respiratory distress.  Abdominal: He exhibits no distension. There is no tenderness. There is no rebound and no  guarding.  Musculoskeletal: Normal range of motion.  FROM in bil knees, bil warmth, purpura present in bil le medial aspect.  No appreciable swelling  Neurological: He is alert and oriented to person, place, and time.  Skin: Skin is warm and dry.  Vitals reviewed.   ED Course  Procedures (including critical care time) Labs Review Labs Reviewed  CBC WITH DIFFERENTIAL/PLATELET - Abnormal; Notable for the following:    Hemoglobin 12.9 (*)    HCT 38.3 (*)    Monocytes Absolute 1.2 (*)    All other components within normal limits  BASIC METABOLIC PANEL - Abnormal; Notable for the following:    Sodium 130 (*)    Chloride 93 (*)    Glucose, Bld 256 (*)    BUN 21 (*)    Creatinine, Ser 1.56 (*)    Calcium 8.8 (*)    GFR calc non Af Amer 39 (*)    GFR calc Af Amer 45 (*)    All other components within normal limits  URINALYSIS, ROUTINE W REFLEX MICROSCOPIC (NOT AT [facility]) - Abnormal; Notable for the following:    Glucose, UA 250 (*)    Hgb urine dipstick TRACE (*)    All other components within normal limits  URINE MICROSCOPIC-ADD ON  Randolm Idol, ED    Imaging Review Dg Knee Complete 4 Views Left  06/18/2015  CLINICAL DATA:  Knee pain with swelling. EXAM: LEFT KNEE - COMPLETE 4+ VIEW COMPARISON:  02/17/2013 FINDINGS: Tricompartmental degenerative change. Chondrocalcinosis. Moderate joint space narrowing and spurring medially. Mild joint space narrowing laterally.  Patellofemoral degenerative change in spurring. Small joint effusion. Negative for fracture Arterial calcification IMPRESSION: Tricompartmental degenerative change and effusion. No acute abnormality and no change from the prior study Electronically Signed   By: Franchot Gallo M.D.   On: 06/18/2015 16:28   I have personally reviewed and evaluated these images and lab results as part of my medical decision-making.   EKG Interpretation   Date/Time:  Saturday June 18 2015 15:02:16 EDT Ventricular Rate:  94 PR Interval:  176 QRS Duration: 105 QT Interval:  357 QTC Calculation: 446 R Axis:   -31 Text Interpretation:  Sinus rhythm Incomplete RBBB and LAFB Low voltage,  precordial leads Abnormal R-wave progression, late transition No  significant change since last tracing Confirmed by Debby Freiberg [phone])  on 06/18/2015 3:13:32 PM      MDM   Final diagnoses:  Knee pain, chronic, left    81 y.o. male with pertinent PMH of CAD, chronic back and knee pain bil with scheduled replacement of knee within next month, vascular dementia presents with acute on chronic atraumatic L knee pain.  No systemic symptoms, however history is limited due to pt's underlying mental status chronically.  Exam as above nonspecific.    Wu obtained as above.  Pt still unable to fully range knee.  Discussed that he needed an arthrocentesis but he refused.  I explained that he could become sicker, be permanently disabled, or die, and he reiterated risks and continued to refuse.  Although he has dementia, I feel that he is currently understanding and of sound mind to make his own medical decisions, which was verified by his family member in the room.  DC AMA  I have reviewed all laboratory and imaging studies if ordered as above  1. Knee pain, chronic, left         Debby Freiberg, MD 06/19/15 [phone]

## 2015-06-18 NOTE — ED Notes (Signed)
Pt family member approached nurses station asking about delay. This RN in with family to talk. Pt states that he is in pain and ready to go home. Explained delay to pt and family. Pt and family stating that they will leave unless MD comes to talk with them soon.

## 2015-06-23 ENCOUNTER — Other Ambulatory Visit: Payer: Self-pay | Admitting: Orthopedic Surgery

## 2015-06-28 ENCOUNTER — Encounter: Payer: Self-pay | Admitting: Family Medicine

## 2015-06-28 ENCOUNTER — Ambulatory Visit (INDEPENDENT_AMBULATORY_CARE_PROVIDER_SITE_OTHER): Payer: PPO | Admitting: Family Medicine

## 2015-06-28 VITALS — BP 130/64 | HR 74 | Temp 97.4°F | Wt 151.0 lb

## 2015-06-28 DIAGNOSIS — Z23 Encounter for immunization: Secondary | ICD-10-CM

## 2015-06-28 DIAGNOSIS — R300 Dysuria: Secondary | ICD-10-CM

## 2015-06-28 DIAGNOSIS — D72829 Elevated white blood cell count, unspecified: Secondary | ICD-10-CM | POA: Diagnosis not present

## 2015-06-28 DIAGNOSIS — E1129 Type 2 diabetes mellitus with other diabetic kidney complication: Secondary | ICD-10-CM

## 2015-06-28 LAB — CBC WITH DIFFERENTIAL/PLATELET
BASOS PCT: 0 % (ref 0–1)
Basophils Absolute: 0 10*3/uL (ref 0.0–0.1)
EOS ABS: 0.1 10*3/uL (ref 0.0–0.7)
EOS PCT: 1 % (ref 0–5)
HCT: 39.1 % (ref 39.0–52.0)
Hemoglobin: 13 g/dL (ref 13.0–17.0)
Lymphocytes Relative: 22 % (ref 12–46)
Lymphs Abs: 2.3 10*3/uL (ref 0.7–4.0)
MCH: 28.3 pg (ref 26.0–34.0)
MCHC: 33.2 g/dL (ref 30.0–36.0)
MCV: 85 fL (ref 78.0–100.0)
MONO ABS: 1.1 10*3/uL — AB (ref 0.1–1.0)
MONOS PCT: 10 % (ref 3–12)
MPV: 10 fL (ref 8.6–12.4)
NEUTROS PCT: 67 % (ref 43–77)
Neutro Abs: 7 10*3/uL (ref 1.7–7.7)
PLATELETS: 384 10*3/uL (ref 150–400)
RBC: 4.6 MIL/uL (ref 4.22–5.81)
RDW: 14.2 % (ref 11.5–15.5)
WBC: 10.5 10*3/uL (ref 4.0–10.5)

## 2015-06-28 LAB — POCT URINALYSIS DIPSTICK
BILIRUBIN UA: NEGATIVE
Glucose, UA: NEGATIVE
KETONES UA: NEGATIVE
Leukocytes, UA: NEGATIVE
Nitrite, UA: NEGATIVE
PH UA: 5.5
PROTEIN UA: NEGATIVE
RBC UA: NEGATIVE
SPEC GRAV UA: 1.01
Urobilinogen, UA: 0.2

## 2015-06-28 LAB — POCT GLYCOSYLATED HEMOGLOBIN (HGB A1C): HEMOGLOBIN A1C: 7.5

## 2015-06-28 NOTE — Progress Notes (Signed)
   Subjective:    Patient ID: [name] Elliott, male    DOB: 04-30-1928, 81 y.o.   MRN: [id number]  HPI  CC: blood count  # Elevated WBC:  Scheduled for left knee replacement 11/8 with Dr. Mardelle Matte, at last visit had fluid drawn off the knee that was a little cloudy and looked like may be pseudogout. Also drew blood work and was told the WBC was elevated and that they needed to see a medical doctor about this.  They are unsure what the WBC level was, daughter will call for records to be faxed over  He denies cough, fevers, chills, new redness/warmth of his skin, but does endorse some dysuria that he noted today. Has not been peeing more frequently than normal. ROS: as above  Labwork faxed over dated 06/21/2015 WBC 7.8 RBC 4.21 Hgb 11.9 Hematocrit 35.3 MCV 83.8 ESR 76 CRP 19 Synovial fluid Cell count: WBC 5615 cu mm, calcium pyrophosphate crystals, no organisms on gram stain  Social Hx: former smoker  Review of Systems   See HPI for ROS.   Past medical history, surgical, family, and social history reviewed and updated in the EMR as appropriate. Objective:  BP 130/64 mmHg  Pulse 74  Temp(Src) 97.4 F (36.3 C) (Oral)  Wt 151 lb (68.493 kg) Vitals and nursing note reviewed  General: NAD HEENT: PERRL, EOMI CV: RRR, normal heart sounds, no murmur appreciated. Resp: clear to auscultation bilaterally, normal effort Skin: no rashes Neuro: alert, no deficits noted  Assessment & Plan:  1. Dysuria / concern for leukocytosis Negative UA in clinic, vague . Records received from ortho office and WBC appears normal. There is a mild anemia. Also elevated CRP/ESR but this could be explained by pseudogout. Will repeat CBC today and follow up, may need to discuss with ortho to see exactly what their concern was.  - POCT urinalysis dipstick - CBC with Differential/Platelet  2. 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 Follow up with PCP as needed. - POCT A1C  3. Encounter for immunization - flu shot today

## 2015-06-28 NOTE — Patient Instructions (Signed)
We will re-check the blood work today, and look at his urine for signs of infection.

## 2015-07-01 ENCOUNTER — Encounter (HOSPITAL_COMMUNITY): Payer: Self-pay

## 2015-07-01 ENCOUNTER — Encounter (HOSPITAL_COMMUNITY)
Admission: RE | Admit: 2015-07-01 | Discharge: 2015-07-01 | Disposition: A | Discharge status: Home / Self Care | Payer: PPO | Source: Ambulatory Visit | Attending: Orthopedic Surgery | Admitting: Orthopedic Surgery

## 2015-07-01 DIAGNOSIS — E119 Type 2 diabetes mellitus without complications: Secondary | ICD-10-CM | POA: Insufficient documentation

## 2015-07-01 DIAGNOSIS — Z79899 Other long term (current) drug therapy: Secondary | ICD-10-CM | POA: Diagnosis not present

## 2015-07-01 DIAGNOSIS — Z01812 Encounter for preprocedural laboratory examination: Secondary | ICD-10-CM | POA: Diagnosis not present

## 2015-07-01 DIAGNOSIS — Z8673 Personal history of transient ischemic attack (TIA), and cerebral infarction without residual deficits: Secondary | ICD-10-CM | POA: Insufficient documentation

## 2015-07-01 DIAGNOSIS — I2510 Atherosclerotic heart disease of native coronary artery without angina pectoris: Secondary | ICD-10-CM | POA: Diagnosis not present

## 2015-07-01 DIAGNOSIS — Z7982 Long term (current) use of aspirin: Secondary | ICD-10-CM | POA: Diagnosis not present

## 2015-07-01 DIAGNOSIS — E785 Hyperlipidemia, unspecified: Secondary | ICD-10-CM | POA: Diagnosis not present

## 2015-07-01 DIAGNOSIS — Z7902 Long term (current) use of antithrombotics/antiplatelets: Secondary | ICD-10-CM | POA: Diagnosis not present

## 2015-07-01 DIAGNOSIS — Z01818 Encounter for other preprocedural examination: Secondary | ICD-10-CM | POA: Insufficient documentation

## 2015-07-01 DIAGNOSIS — Z87891 Personal history of nicotine dependence: Secondary | ICD-10-CM | POA: Insufficient documentation

## 2015-07-01 DIAGNOSIS — Z86718 Personal history of other venous thrombosis and embolism: Secondary | ICD-10-CM | POA: Insufficient documentation

## 2015-07-01 DIAGNOSIS — M179 Osteoarthritis of knee, unspecified: Secondary | ICD-10-CM | POA: Diagnosis not present

## 2015-07-01 DIAGNOSIS — I10 Essential (primary) hypertension: Secondary | ICD-10-CM | POA: Insufficient documentation

## 2015-07-01 DIAGNOSIS — I4891 Unspecified atrial fibrillation: Secondary | ICD-10-CM | POA: Diagnosis not present

## 2015-07-01 HISTORY — DX: Cardiac arrhythmia, unspecified: I49.9

## 2015-07-01 LAB — SURGICAL PCR SCREEN
MRSA, PCR: NEGATIVE
Staphylococcus aureus: NEGATIVE

## 2015-07-01 LAB — CBC
HCT: 41.4 % (ref 39.0–52.0)
Hemoglobin: 13.4 g/dL (ref 13.0–17.0)
MCH: 28 pg (ref 26.0–34.0)
MCHC: 32.4 g/dL (ref 30.0–36.0)
MCV: 86.6 fL (ref 78.0–100.0)
PLATELETS: 341 10*3/uL (ref 150–400)
RBC: 4.78 MIL/uL (ref 4.22–5.81)
RDW: 15.1 % (ref 11.5–15.5)
WBC: 8.6 10*3/uL (ref 4.0–10.5)

## 2015-07-01 LAB — PROTIME-INR
INR: 3.61 — AB (ref 0.00–1.49)
PROTHROMBIN TIME: 35.2 s — AB (ref 11.6–15.2)

## 2015-07-01 LAB — BASIC METABOLIC PANEL
Anion gap: 11 (ref 5–15)
BUN: 20 mg/dL (ref 6–20)
CALCIUM: 8.9 mg/dL (ref 8.9–10.3)
CO2: 27 mmol/L (ref 22–32)
CREATININE: 1.53 mg/dL — AB (ref 0.61–1.24)
Chloride: 95 mmol/L — ABNORMAL LOW (ref 101–111)
GFR calc non Af Amer: 40 mL/min — ABNORMAL LOW (ref >60)
GFR, EST AFRICAN AMERICAN: 46 mL/min — AB (ref >60)
Glucose, Bld: 125 mg/dL — ABNORMAL HIGH (ref 65–99)
Potassium: 3.9 mmol/L (ref 3.5–5.1)
Sodium: 133 mmol/L — ABNORMAL LOW (ref 135–145)

## 2015-07-01 NOTE — Progress Notes (Signed)
Anesthesia Chart Review:  Pt is 81 year old male scheduled for L total knee arthroplasty on 07/12/2015 with Dr. Mardelle Matte.  Gets primary care at [facility]. Cardiologist is Dr. Acie Fredrickson.   PMH includes: CAD (LAD angioplasty 2006), atrial fibrillation, HTN, DM, stroke (07/2014), DVT, hyperlipidemia, vascular dementia, thrombocytopenia, PVCs. Former smoker. BMI 28. S/p L reverse shoulder arthroplasty 02/05/12.   Medications include: ASA, lasix, imdur, prilosec, potassium, xarelto. Pt to stop xarelto 3 days prior to surgery.   Preoperative labs reviewed.  PT 35.2. Will repeat DOS.   EKG 06/18/2015: Sinus rhythm. Incomplete RBBB and LAFB. Low voltage, precordial leads. Abnormal R-wave progression, late transition  Echo 08/02/2014:  - Left ventricle: The cavity size was normal. Wall thickness was normal. Systolic function was mildly reduced. The estimated ejection fraction was in the range of 45% to 50%. Diffuse hypokinesis. Doppler parameters are consistent with abnormal left ventricular relaxation (grade 1 diastolic dysfunction). - Aortic valve: There was mild to moderate regurgitation. - Mitral valve: There was mild regurgitation.  Carotid duplex US 08/02/2014: 1-39% B ICA stenosis  Cardiac cath 08/08/2011: 1. Mild to moderate coronary artery a regularities primarily involving the left anterior descending artery system (30-40% proximal LAD; 50-60% D1). None of these lesions were obstructive and in fact the stenosis appears to be stable from his previous catheterization 5 years ago. 2. Well-preserved left inject her systolic function.  Pt has cardiac clearance for knee replacement at moderate to high risk from Dr. Acie Fredrickson in [place] note dated 05/03/15.   If PT acceptable DOS, I anticipate pt can proceed with surgery as scheduled.   Willeen Cass, FNP-BC [facility] Short Stay Surgical Center/Anesthesiology Phone: [phone] 07/01/2015 4:00 PM

## 2015-07-01 NOTE — Pre-Procedure Instructions (Signed)
Westlee Devita  07/01/2015      [facility] - [place], [place] - [address] [place] [address] Phone: [phone] Fax: [phone] [place], [place] [address] Phone: [phone] Fax: [phone]  [facility] [place], [place] [address] Phone: [phone] Fax: [phone]    Your procedure is scheduled on Nov 8th  Report to [facility] at 530 A.M.  Call this number if you have problems the morning of surgery:  [phone]   Remember:  Do not eat food or drink liquids after midnight.  Take these medicines the morning of surgery with A SIP OF WATER Gabapentin (Neurontin), Hydrocodone-acetaminophen (Norco) if needed, isosorbide mononitrate (Imdur), Omeprazole (Prilosec)  Stop taking Xarelto as directed by your Dr.  Stop taking aspirin, Ibuprofen, Aleve, BC's, Goody's, Herbal medications, Fish Oil   Do not wear jewelry, make-up or nail polish.  Do not wear lotions, powders, or perfumes.  You may wear deodorant.  Do not shave 48 hours prior to surgery.  Men may shave face and neck.  Do not bring valuables to the hospital.  [facility] is not responsible for any belongings or valuables.  Contacts, dentures or bridgework may not be worn into surgery.  Leave your suitcase in the car.  After surgery it may be brought to your room.  For patients admitted to the hospital, discharge time will be determined by your treatment team.  Patients discharged the day of surgery will not be allowed to drive home.    Special instructions:  [facility] - Preparing for Surgery  Before surgery, you can play an important role.  Because skin is not sterile, your skin needs to be as free of germs as possible.  You can reduce the number of germs on you skin  by washing with CHG (chlorahexidine gluconate) soap before surgery.  CHG is an antiseptic cleaner which kills germs and bonds with the skin to continue killing germs even after washing.  Please DO NOT use if you have an allergy to CHG or antibacterial soaps.  If your skin becomes reddened/irritated stop using the CHG and inform your nurse when you arrive at Short Stay.  Do not shave (including legs and underarms) for at least 48 hours prior to the first CHG shower.  You may shave your face.  Please follow these instructions carefully:   1.  Shower with CHG Soap the night before surgery and the    morning of Surgery.  2.  If you choose to wash your hair, wash your hair first as usual with your    normal shampoo.  3.  After you shampoo, rinse your hair and body thoroughly to remove the   Shampoo.  4.  Use CHG as you would any other liquid soap.  You can apply chg directly   to the skin and wash gently with scrungie or a clean washcloth.  5.  Apply the CHG Soap to your body ONLY FROM THE NECK DOWN.   Do not use on open wounds or open sores.  Avoid contact with your eyes,  ears, mouth and genitals (private parts).  Wash genitals (private parts)  with your normal soap.  6.  Wash thoroughly, paying special attention to the area where your surgery   will be  performed.  7.  Thoroughly rinse your body with warm water from the neck down.  8.  DO NOT shower/wash with your normal soap after using and rinsing off   the CHG Soap.  9.  Pat yourself dry with a clean towel.            10.  Wear clean pajamas.            11.  Place clean sheets on your bed the night of your first shower and do not  sleep with pets.  Day of Surgery  Do not apply any lotions/deoderants the morning of surgery.  Please wear clean clothes to the hospital/surgery center.     Please read over the following fact sheets that you were given. Pain Booklet, Coughing and Deep Breathing, Total Joint Packet and MRSA  Information

## 2015-07-01 NOTE — Progress Notes (Signed)
PCP is [facility] -saw Dr Lamar Benes last time, his daughter states he just sees whomever is available.  Cardiologist is Dr Cathie Olden- saw him in Aug 2016 Pt daughter states that he was instructed to stop taking Xarelto 3 days before his surgery Last Card cath was 2012 Last echo 08-02-2014 EKG noted form 06-19-15

## 2015-07-04 ENCOUNTER — Telehealth: Payer: Self-pay | Admitting: Family Medicine

## 2015-07-04 NOTE — Telephone Encounter (Signed)
LV for normal testing results, if orthopedic office still has concerns asked patient's daughter to call our clinic and let us know. -Dr. Lamar Benes

## 2015-07-12 ENCOUNTER — Inpatient Hospital Stay (HOSPITAL_COMMUNITY)
Admission: RE | Admit: 2015-07-12 | Discharge: 2015-07-15 | DRG: 470 | Disposition: A | Discharge status: Skilled Nursing Facility | Payer: PPO | Source: Ambulatory Visit | Attending: Orthopedic Surgery | Admitting: Orthopedic Surgery

## 2015-07-12 ENCOUNTER — Inpatient Hospital Stay (HOSPITAL_COMMUNITY): Payer: PPO | Admitting: Anesthesiology

## 2015-07-12 ENCOUNTER — Encounter (HOSPITAL_COMMUNITY)
Admission: RE | Disposition: A | Discharge status: Skilled Nursing Facility | Payer: Self-pay | Source: Ambulatory Visit | Attending: Orthopedic Surgery

## 2015-07-12 ENCOUNTER — Encounter (HOSPITAL_COMMUNITY): Payer: Self-pay | Admitting: *Deleted

## 2015-07-12 ENCOUNTER — Inpatient Hospital Stay (HOSPITAL_COMMUNITY): Payer: PPO | Admitting: Emergency Medicine

## 2015-07-12 ENCOUNTER — Inpatient Hospital Stay (HOSPITAL_COMMUNITY): Payer: PPO

## 2015-07-12 DIAGNOSIS — I4891 Unspecified atrial fibrillation: Secondary | ICD-10-CM | POA: Diagnosis present

## 2015-07-12 DIAGNOSIS — Z87891 Personal history of nicotine dependence: Secondary | ICD-10-CM | POA: Diagnosis not present

## 2015-07-12 DIAGNOSIS — M1712 Unilateral primary osteoarthritis, left knee: Secondary | ICD-10-CM | POA: Diagnosis present

## 2015-07-12 DIAGNOSIS — I2510 Atherosclerotic heart disease of native coronary artery without angina pectoris: Secondary | ICD-10-CM | POA: Diagnosis present

## 2015-07-12 DIAGNOSIS — Z96659 Presence of unspecified artificial knee joint: Secondary | ICD-10-CM

## 2015-07-12 DIAGNOSIS — E875 Hyperkalemia: Secondary | ICD-10-CM | POA: Diagnosis present

## 2015-07-12 DIAGNOSIS — E1122 Type 2 diabetes mellitus with diabetic chronic kidney disease: Secondary | ICD-10-CM | POA: Diagnosis present

## 2015-07-12 DIAGNOSIS — Z7982 Long term (current) use of aspirin: Secondary | ICD-10-CM

## 2015-07-12 DIAGNOSIS — M1710 Unilateral primary osteoarthritis, unspecified knee: Secondary | ICD-10-CM | POA: Diagnosis present

## 2015-07-12 DIAGNOSIS — F0150 Vascular dementia without behavioral disturbance: Secondary | ICD-10-CM | POA: Diagnosis present

## 2015-07-12 DIAGNOSIS — M25562 Pain in left knee: Secondary | ICD-10-CM | POA: Diagnosis present

## 2015-07-12 DIAGNOSIS — R339 Retention of urine, unspecified: Secondary | ICD-10-CM | POA: Diagnosis not present

## 2015-07-12 DIAGNOSIS — N189 Chronic kidney disease, unspecified: Secondary | ICD-10-CM | POA: Diagnosis present

## 2015-07-12 DIAGNOSIS — Z8673 Personal history of transient ischemic attack (TIA), and cerebral infarction without residual deficits: Secondary | ICD-10-CM

## 2015-07-12 DIAGNOSIS — I129 Hypertensive chronic kidney disease with stage 1 through stage 4 chronic kidney disease, or unspecified chronic kidney disease: Secondary | ICD-10-CM | POA: Diagnosis present

## 2015-07-12 DIAGNOSIS — M179 Osteoarthritis of knee, unspecified: Secondary | ICD-10-CM | POA: Diagnosis present

## 2015-07-12 HISTORY — DX: Unilateral primary osteoarthritis, left knee: M17.12

## 2015-07-12 HISTORY — PX: TOTAL KNEE ARTHROPLASTY: SHX125

## 2015-07-12 LAB — GLUCOSE, CAPILLARY: Glucose-Capillary: 125 mg/dL — ABNORMAL HIGH (ref 65–99)

## 2015-07-12 LAB — PROTIME-INR
INR: 1.13 (ref 0.00–1.49)
PROTHROMBIN TIME: 14.7 s (ref 11.6–15.2)

## 2015-07-12 SURGERY — ARTHROPLASTY, KNEE, TOTAL
Anesthesia: General | Site: Knee | Laterality: Left

## 2015-07-12 MED ORDER — SODIUM CHLORIDE 0.9 % IJ SOLN
INTRAMUSCULAR | Status: AC
  Filled 2015-07-12: qty 10

## 2015-07-12 MED ORDER — PHENOL 1.4 % MT LIQD: 1.0000 | OROMUCOSAL | Status: DC | PRN

## 2015-07-12 MED ORDER — PANTOPRAZOLE SODIUM 40 MG PO TBEC
80.0000 mg | DELAYED_RELEASE_TABLET | Freq: Every day | ORAL | Status: DC
  Administered 2015-07-13 – 2015-07-15 (×3): 80 mg via ORAL
  Filled 2015-07-12 (×3): qty 2

## 2015-07-12 MED ORDER — ASPIRIN EC 81 MG PO TBEC
81.0000 mg | DELAYED_RELEASE_TABLET | Freq: Every day | ORAL | Status: DC
  Administered 2015-07-13 – 2015-07-15 (×3): 81 mg via ORAL
  Filled 2015-07-12 (×3): qty 1

## 2015-07-12 MED ORDER — PHENYLEPHRINE HCL 10 MG/ML IJ SOLN
INTRAMUSCULAR | Status: DC | PRN
  Administered 2015-07-12 (×3): 80 ug via INTRAVENOUS

## 2015-07-12 MED ORDER — OXYCODONE-ACETAMINOPHEN 5-325 MG PO TABS
1.0000 | ORAL_TABLET | Freq: Four times a day (QID) | ORAL | Status: DC | PRN
Start: 2015-07-12 — End: 2015-07-22

## 2015-07-12 MED ORDER — HYDROMORPHONE HCL 1 MG/ML IJ SOLN
INTRAMUSCULAR | Status: DC | PRN
  Administered 2015-07-12: .2 mg via INTRAVENOUS
  Administered 2015-07-12 (×3): .1 mg via INTRAVENOUS
  Administered 2015-07-12: .2 mg via INTRAVENOUS

## 2015-07-12 MED ORDER — BISACODYL 10 MG RE SUPP: 10.0000 mg | Freq: Every day | RECTAL | Status: DC | PRN

## 2015-07-12 MED ORDER — GABAPENTIN 300 MG PO CAPS
600.0000 mg | ORAL_CAPSULE | Freq: Three times a day (TID) | ORAL | Status: DC
  Administered 2015-07-12 – 2015-07-14 (×8): 600 mg via ORAL
  Filled 2015-07-12 (×8): qty 2

## 2015-07-12 MED ORDER — LIDOCAINE HCL (CARDIAC) 20 MG/ML IV SOLN
INTRAVENOUS | Status: AC
  Filled 2015-07-12: qty 5

## 2015-07-12 MED ORDER — SENNA 8.6 MG PO TABS
1.0000 | ORAL_TABLET | Freq: Two times a day (BID) | ORAL | Status: DC
  Administered 2015-07-12 – 2015-07-15 (×6): 8.6 mg via ORAL
  Filled 2015-07-12 (×6): qty 1

## 2015-07-12 MED ORDER — FENTANYL CITRATE (PF) 100 MCG/2ML IJ SOLN
25.0000 ug | INTRAMUSCULAR | Status: DC | PRN
  Administered 2015-07-12 (×2): 25 ug via INTRAVENOUS

## 2015-07-12 MED ORDER — ISOSORBIDE MONONITRATE ER 60 MG PO TB24
60.0000 mg | ORAL_TABLET | Freq: Every morning | ORAL | Status: DC
  Administered 2015-07-14 – 2015-07-15 (×2): 60 mg via ORAL
  Filled 2015-07-12 (×3): qty 1

## 2015-07-12 MED ORDER — ROCURONIUM BROMIDE 100 MG/10ML IV SOLN
INTRAVENOUS | Status: DC | PRN
  Administered 2015-07-12: 40 mg via INTRAVENOUS

## 2015-07-12 MED ORDER — BUPIVACAINE HCL (PF) 0.25 % IJ SOLN
INTRAMUSCULAR | Status: DC | PRN
  Administered 2015-07-12: 20 mL

## 2015-07-12 MED ORDER — BUPIVACAINE HCL (PF) 0.25 % IJ SOLN
INTRAMUSCULAR | Status: AC
  Filled 2015-07-12: qty 30

## 2015-07-12 MED ORDER — METHOCARBAMOL 1000 MG/10ML IJ SOLN
500.0000 mg | Freq: Four times a day (QID) | INTRAVENOUS | Status: DC | PRN
  Filled 2015-07-12: qty 5

## 2015-07-12 MED ORDER — PROPOFOL 10 MG/ML IV BOLUS
INTRAVENOUS | Status: DC | PRN
  Administered 2015-07-12: 120 mg via INTRAVENOUS

## 2015-07-12 MED ORDER — FENTANYL CITRATE (PF) 250 MCG/5ML IJ SOLN
INTRAMUSCULAR | Status: AC
  Filled 2015-07-12: qty 5

## 2015-07-12 MED ORDER — DOCUSATE SODIUM 100 MG PO CAPS
100.0000 mg | ORAL_CAPSULE | Freq: Two times a day (BID) | ORAL | Status: DC
Start: 2015-07-12 — End: 2015-07-15
  Administered 2015-07-12 – 2015-07-15 (×6): 100 mg via ORAL
  Filled 2015-07-12 (×6): qty 1

## 2015-07-12 MED ORDER — GLYCOPYRROLATE 0.2 MG/ML IJ SOLN
INTRAMUSCULAR | Status: AC
  Filled 2015-07-12: qty 2

## 2015-07-12 MED ORDER — METOCLOPRAMIDE HCL 5 MG PO TABS: 5.0000 mg | ORAL_TABLET | Freq: Three times a day (TID) | ORAL | Status: DC | PRN

## 2015-07-12 MED ORDER — MENTHOL 3 MG MT LOZG: 1.0000 | LOZENGE | OROMUCOSAL | Status: DC | PRN

## 2015-07-12 MED ORDER — SUCCINYLCHOLINE CHLORIDE 20 MG/ML IJ SOLN
INTRAMUSCULAR | Status: AC
  Filled 2015-07-12: qty 1

## 2015-07-12 MED ORDER — LIDOCAINE HCL (CARDIAC) 20 MG/ML IV SOLN
INTRAVENOUS | Status: DC | PRN
  Administered 2015-07-12: 100 mg via INTRAVENOUS

## 2015-07-12 MED ORDER — CEFAZOLIN SODIUM-DEXTROSE 2-3 GM-% IV SOLR
2.0000 g | INTRAVENOUS | Status: AC
Start: 2015-07-12 — End: 2015-07-12
  Administered 2015-07-12: 2 g via INTRAVENOUS

## 2015-07-12 MED ORDER — ALUM & MAG HYDROXIDE-SIMETH 200-200-20 MG/5ML PO SUSP: 30.0000 mL | ORAL | Status: DC | PRN

## 2015-07-12 MED ORDER — SODIUM CHLORIDE 0.9 % IR SOLN
Status: DC | PRN
  Administered 2015-07-12 (×2): 1000 mL

## 2015-07-12 MED ORDER — POLYETHYLENE GLYCOL 3350 17 G PO PACK: 17.0000 g | PACK | Freq: Every day | ORAL | Status: DC | PRN

## 2015-07-12 MED ORDER — NEOSTIGMINE METHYLSULFATE 10 MG/10ML IV SOLN
INTRAVENOUS | Status: DC | PRN
  Administered 2015-07-12: 3 mg via INTRAVENOUS

## 2015-07-12 MED ORDER — ACETAMINOPHEN 650 MG RE SUPP: 650.0000 mg | Freq: Four times a day (QID) | RECTAL | Status: DC | PRN

## 2015-07-12 MED ORDER — BUPIVACAINE-EPINEPHRINE (PF) 0.5% -1:200000 IJ SOLN
INTRAMUSCULAR | Status: DC | PRN
  Administered 2015-07-12: 20 mL

## 2015-07-12 MED ORDER — RIVAROXABAN 15 MG PO TABS: 15.0000 mg | ORAL_TABLET | Freq: Every day | ORAL | Status: DC

## 2015-07-12 MED ORDER — POTASSIUM CHLORIDE IN NACL 20-0.45 MEQ/L-% IV SOLN
INTRAVENOUS | Status: DC
  Administered 2015-07-12: 18:00:00 via INTRAVENOUS
  Filled 2015-07-12 (×4): qty 1000

## 2015-07-12 MED ORDER — METHOCARBAMOL 500 MG PO TABS
500.0000 mg | ORAL_TABLET | Freq: Four times a day (QID) | ORAL | Status: DC | PRN
  Administered 2015-07-12 – 2015-07-14 (×5): 500 mg via ORAL
  Filled 2015-07-12 (×4): qty 1

## 2015-07-12 MED ORDER — EPHEDRINE SULFATE 50 MG/ML IJ SOLN
INTRAMUSCULAR | Status: AC
  Filled 2015-07-12: qty 1

## 2015-07-12 MED ORDER — RIVAROXABAN 15 MG PO TABS
15.0000 mg | ORAL_TABLET | Freq: Every day | ORAL | Status: DC
  Administered 2015-07-12 – 2015-07-14 (×3): 15 mg via ORAL
  Filled 2015-07-12 (×5): qty 1

## 2015-07-12 MED ORDER — CEFAZOLIN SODIUM-DEXTROSE 2-3 GM-% IV SOLR
INTRAVENOUS | Status: AC
  Filled 2015-07-12: qty 50

## 2015-07-12 MED ORDER — PSYLLIUM 95 % PO PACK: 1.0000 | PACK | Freq: Every day | ORAL | Status: DC

## 2015-07-12 MED ORDER — BACLOFEN 10 MG PO TABS: 10.0000 mg | ORAL_TABLET | Freq: Three times a day (TID) | ORAL | Status: DC

## 2015-07-12 MED ORDER — HYDROMORPHONE HCL 1 MG/ML IJ SOLN
INTRAMUSCULAR | Status: AC
  Filled 2015-07-12: qty 1

## 2015-07-12 MED ORDER — OXYCODONE HCL 5 MG PO TABS
5.0000 mg | ORAL_TABLET | ORAL | Status: DC | PRN
Start: 2015-07-12 — End: 2015-07-15
  Administered 2015-07-12 – 2015-07-15 (×5): 10 mg via ORAL
  Filled 2015-07-12 (×4): qty 2

## 2015-07-12 MED ORDER — DIPHENHYDRAMINE HCL 12.5 MG/5ML PO ELIX: 12.5000 mg | ORAL_SOLUTION | ORAL | Status: DC | PRN

## 2015-07-12 MED ORDER — ACETAMINOPHEN 325 MG PO TABS
650.0000 mg | ORAL_TABLET | Freq: Four times a day (QID) | ORAL | Status: DC | PRN
  Administered 2015-07-13 – 2015-07-14 (×5): 650 mg via ORAL
  Filled 2015-07-12 (×5): qty 2

## 2015-07-12 MED ORDER — LACTATED RINGERS IV SOLN
INTRAVENOUS | Status: DC | PRN
  Administered 2015-07-12 (×2): via INTRAVENOUS

## 2015-07-12 MED ORDER — ONDANSETRON HCL 4 MG PO TABS: 4.0000 mg | ORAL_TABLET | Freq: Four times a day (QID) | ORAL | Status: DC | PRN

## 2015-07-12 MED ORDER — ONDANSETRON HCL 4 MG/2ML IJ SOLN
INTRAMUSCULAR | Status: DC | PRN
  Administered 2015-07-12: 4 mg via INTRAVENOUS

## 2015-07-12 MED ORDER — ONDANSETRON HCL 4 MG/2ML IJ SOLN: 4.0000 mg | Freq: Four times a day (QID) | INTRAMUSCULAR | Status: DC | PRN

## 2015-07-12 MED ORDER — FENTANYL CITRATE (PF) 100 MCG/2ML IJ SOLN
INTRAMUSCULAR | Status: DC | PRN
  Administered 2015-07-12 (×5): 50 ug via INTRAVENOUS

## 2015-07-12 MED ORDER — MAGNESIUM CITRATE PO SOLN: 1.0000 | Freq: Once | ORAL | Status: DC | PRN

## 2015-07-12 MED ORDER — PROPOFOL 10 MG/ML IV BOLUS
INTRAVENOUS | Status: AC
  Filled 2015-07-12: qty 20

## 2015-07-12 MED ORDER — ONDANSETRON HCL 4 MG/2ML IJ SOLN: 4.0000 mg | Freq: Once | INTRAMUSCULAR | Status: DC | PRN

## 2015-07-12 MED ORDER — CEFAZOLIN SODIUM-DEXTROSE 2-3 GM-% IV SOLR
2.0000 g | Freq: Four times a day (QID) | INTRAVENOUS | Status: AC
Start: 2015-07-12 — End: 2015-07-13
  Administered 2015-07-12: 2 g via INTRAVENOUS
  Filled 2015-07-12 (×3): qty 50

## 2015-07-12 MED ORDER — ROCURONIUM BROMIDE 50 MG/5ML IV SOLN
INTRAVENOUS | Status: AC
  Filled 2015-07-12: qty 1

## 2015-07-12 MED ORDER — ACETAMINOPHEN 10 MG/ML IV SOLN
INTRAVENOUS | Status: DC | PRN
  Administered 2015-07-12: 1000 mg via INTRAVENOUS

## 2015-07-12 MED ORDER — FENTANYL CITRATE (PF) 100 MCG/2ML IJ SOLN
INTRAMUSCULAR | Status: AC
  Filled 2015-07-12: qty 2

## 2015-07-12 MED ORDER — MIDAZOLAM HCL 2 MG/2ML IJ SOLN
INTRAMUSCULAR | Status: AC
  Filled 2015-07-12: qty 4

## 2015-07-12 MED ORDER — HYDROMORPHONE HCL 1 MG/ML IJ SOLN
0.5000 mg | INTRAMUSCULAR | Status: DC | PRN
  Administered 2015-07-12: 0.5 mg via INTRAVENOUS
  Filled 2015-07-12: qty 1

## 2015-07-12 MED ORDER — ONDANSETRON HCL 4 MG/2ML IJ SOLN
INTRAMUSCULAR | Status: AC
  Filled 2015-07-12: qty 2

## 2015-07-12 MED ORDER — ONDANSETRON HCL 4 MG PO TABS: 4.0000 mg | ORAL_TABLET | Freq: Three times a day (TID) | ORAL | Status: DC | PRN

## 2015-07-12 MED ORDER — GLYCOPYRROLATE 0.2 MG/ML IJ SOLN
INTRAMUSCULAR | Status: DC | PRN
  Administered 2015-07-12: .4 mg via INTRAVENOUS

## 2015-07-12 MED ORDER — SENNA-DOCUSATE SODIUM 8.6-50 MG PO TABS: 2.0000 | ORAL_TABLET | Freq: Every day | ORAL | Status: DC

## 2015-07-12 MED ORDER — KETOROLAC TROMETHAMINE 15 MG/ML IJ SOLN
INTRAMUSCULAR | Status: AC
  Filled 2015-07-12: qty 1

## 2015-07-12 MED ORDER — METOCLOPRAMIDE HCL 5 MG/ML IJ SOLN: 5.0000 mg | Freq: Three times a day (TID) | INTRAMUSCULAR | Status: DC | PRN

## 2015-07-12 MED ORDER — FUROSEMIDE 40 MG PO TABS
40.0000 mg | ORAL_TABLET | Freq: Every day | ORAL | Status: DC
  Administered 2015-07-15: 40 mg via ORAL
  Filled 2015-07-12: qty 1

## 2015-07-12 MED ORDER — PHENYLEPHRINE 40 MCG/ML (10ML) SYRINGE FOR IV PUSH (FOR BLOOD PRESSURE SUPPORT)
PREFILLED_SYRINGE | INTRAVENOUS | Status: AC
  Filled 2015-07-12: qty 10

## 2015-07-12 MED ORDER — KETOROLAC TROMETHAMINE 15 MG/ML IJ SOLN
7.5000 mg | Freq: Four times a day (QID) | INTRAMUSCULAR | Status: AC
  Administered 2015-07-12 – 2015-07-13 (×4): 7.5 mg via INTRAVENOUS
  Filled 2015-07-12 (×3): qty 1

## 2015-07-12 MED ORDER — NEOSTIGMINE METHYLSULFATE 10 MG/10ML IV SOLN
INTRAVENOUS | Status: AC
  Filled 2015-07-12: qty 5

## 2015-07-12 MED ORDER — POTASSIUM CHLORIDE CRYS ER 20 MEQ PO TBCR
20.0000 meq | EXTENDED_RELEASE_TABLET | Freq: Two times a day (BID) | ORAL | Status: DC
  Administered 2015-07-12 – 2015-07-13 (×2): 20 meq via ORAL
  Filled 2015-07-12 (×2): qty 1

## 2015-07-12 MED ORDER — METHOCARBAMOL 500 MG PO TABS
ORAL_TABLET | ORAL | Status: AC
  Filled 2015-07-12: qty 1

## 2015-07-12 MED ORDER — OXYCODONE HCL 5 MG PO TABS
ORAL_TABLET | ORAL | Status: AC
  Filled 2015-07-12: qty 2

## 2015-07-12 SURGICAL SUPPLY — 71 items
APL SKNCLS STERI-STRIP NONHPOA (GAUZE/BANDAGES/DRESSINGS) ×1
BANDAGE ELASTIC 6 VELCRO ST LF (GAUZE/BANDAGES/DRESSINGS) ×6 IMPLANT
BANDAGE ESMARK 6X9 LF (GAUZE/BANDAGES/DRESSINGS) ×1 IMPLANT
BENZOIN TINCTURE PRP APPL 2/3 (GAUZE/BANDAGES/DRESSINGS) ×3 IMPLANT
BLADE SAG 18X100X1.27 (BLADE) ×3 IMPLANT
BLADE SAW RECIP 87.9 MT (BLADE) ×3 IMPLANT
BLADE SAW SGTL 13X75X1.27 (BLADE) ×3 IMPLANT
BNDG CMPR 9X6 STRL LF SNTH (GAUZE/BANDAGES/DRESSINGS) ×1
BNDG ESMARK 6X9 LF (GAUZE/BANDAGES/DRESSINGS) ×3
BOOTCOVER CLEANROOM LRG (PROTECTIVE WEAR) ×6 IMPLANT
BOWL SMART MIX CTS (DISPOSABLE) ×3 IMPLANT
CAP KNEE TOTAL 3 SIGMA ×2 IMPLANT
CEMENT HV SMART SET (Cement) ×6 IMPLANT
CLOSURE STERI-STRIP 1/2X4 (GAUZE/BANDAGES/DRESSINGS) ×1
CLSR STERI-STRIP ANTIMIC 1/2X4 (GAUZE/BANDAGES/DRESSINGS) ×2 IMPLANT
COVER SURGICAL LIGHT HANDLE (MISCELLANEOUS) ×3 IMPLANT
CUFF TOURNIQUET SINGLE 34IN LL (TOURNIQUET CUFF) ×3 IMPLANT
DRAPE EXTREMITY T 121X128X90 (DRAPE) ×3 IMPLANT
DRAPE U-SHAPE 47X51 STRL (DRAPES) ×3 IMPLANT
DRSG PAD ABDOMINAL 8X10 ST (GAUZE/BANDAGES/DRESSINGS) ×2 IMPLANT
DURAPREP 26ML APPLICATOR (WOUND CARE) ×3 IMPLANT
ELECT CAUTERY BLADE 6.4 (BLADE) ×3 IMPLANT
ELECT REM PT RETURN 9FT ADLT (ELECTROSURGICAL) ×3
ELECTRODE REM PT RTRN 9FT ADLT (ELECTROSURGICAL) ×1 IMPLANT
FACESHIELD STD STERILE (MASK) ×3 IMPLANT
GAUZE SPONGE 4X4 12PLY STRL (GAUZE/BANDAGES/DRESSINGS) ×3 IMPLANT
GLOVE BIO SURGEON STRL SZ 6.5 (GLOVE) ×1 IMPLANT
GLOVE BIO SURGEONS STRL SZ 6.5 (GLOVE) ×1
GLOVE BIOGEL PI IND STRL 7.0 (GLOVE) IMPLANT
GLOVE BIOGEL PI IND STRL 8 (GLOVE) ×1 IMPLANT
GLOVE BIOGEL PI INDICATOR 7.0 (GLOVE) ×4
GLOVE BIOGEL PI INDICATOR 8 (GLOVE) ×2
GLOVE BIOGEL PI ORTHO PRO SZ8 (GLOVE) ×2
GLOVE ECLIPSE 7.0 STRL STRAW (GLOVE) ×4 IMPLANT
GLOVE ORTHO TXT STRL SZ7.5 (GLOVE) ×3 IMPLANT
GLOVE PI ORTHO PRO STRL SZ8 (GLOVE) ×1 IMPLANT
GLOVE SURG ORTHO 8.0 STRL STRW (GLOVE) ×3 IMPLANT
GOWN STRL REUS W/ TWL XL LVL3 (GOWN DISPOSABLE) ×1 IMPLANT
GOWN STRL REUS W/TWL 2XL LVL3 (GOWN DISPOSABLE) ×3 IMPLANT
GOWN STRL REUS W/TWL XL LVL3 (GOWN DISPOSABLE) ×3
HANDPIECE INTERPULSE COAX TIP (DISPOSABLE) ×3
HOOD PEEL AWAY FACE SHEILD DIS (HOOD) ×6 IMPLANT
IMMOBILIZER KNEE 20 (SOFTGOODS) ×3
IMMOBILIZER KNEE 20 THIGH 36 (SOFTGOODS) IMPLANT
IMMOBILIZER KNEE 22 (SOFTGOODS) ×3 IMPLANT
KIT BASIN OR (CUSTOM PROCEDURE TRAY) ×3 IMPLANT
KIT ROOM TURNOVER OR (KITS) ×3 IMPLANT
MANIFOLD NEPTUNE II (INSTRUMENTS) ×3 IMPLANT
NDL 18GX1X1/2 (RX/OR ONLY) (NEEDLE) ×1 IMPLANT
NEEDLE 18GX1X1/2 (RX/OR ONLY) (NEEDLE) ×3 IMPLANT
NS IRRIG 1000ML POUR BTL (IV SOLUTION) ×3 IMPLANT
PACK TOTAL JOINT (CUSTOM PROCEDURE TRAY) ×3 IMPLANT
PACK UNIVERSAL I (CUSTOM PROCEDURE TRAY) ×3 IMPLANT
PAD ABD 8X10 STRL (GAUZE/BANDAGES/DRESSINGS) ×3 IMPLANT
PAD ARMBOARD 7.5X6 YLW CONV (MISCELLANEOUS) ×6 IMPLANT
PAD CAST 4YDX4 CTTN HI CHSV (CAST SUPPLIES) ×1 IMPLANT
PADDING CAST COTTON 4X4 STRL (CAST SUPPLIES) ×3
PADDING CAST COTTON 6X4 STRL (CAST SUPPLIES) ×3 IMPLANT
SET HNDPC FAN SPRY TIP SCT (DISPOSABLE) ×1 IMPLANT
SUCTION FRAZIER TIP 10 FR DISP (SUCTIONS) ×3 IMPLANT
SUT MNCRL AB 4-0 PS2 18 (SUTURE) ×2 IMPLANT
SUT VIC AB 0 CT1 27 (SUTURE) ×6
SUT VIC AB 0 CT1 27XBRD ANBCTR (SUTURE) ×1 IMPLANT
SUT VIC AB 2-0 CT1 27 (SUTURE) ×3
SUT VIC AB 2-0 CT1 TAPERPNT 27 (SUTURE) ×1 IMPLANT
SUT VIC AB 3-0 SH 8-18 (SUTURE) ×6 IMPLANT
SYR 30ML LL (SYRINGE) IMPLANT
SYR 50ML LL SCALE MARK (SYRINGE) ×3 IMPLANT
TOWEL OR 17X24 6PK STRL BLUE (TOWEL DISPOSABLE) ×3 IMPLANT
TOWEL OR 17X26 10 PK STRL BLUE (TOWEL DISPOSABLE) ×3 IMPLANT
WATER STERILE IRR 1000ML POUR (IV SOLUTION) ×6 IMPLANT

## 2015-07-12 NOTE — Op Note (Signed)
DATE OF SURGERY:  07/12/2015 TIME: 8:57 AM  PATIENT NAME:  [name] Elliott   AGE: 81 y.o.    PRE-OPERATIVE DIAGNOSIS:  OA LEFT KNEE  POST-OPERATIVE DIAGNOSIS:  Same  PROCEDURE:  Procedure(s): TOTAL KNEE ARTHROPLASTY   SURGEON:  Johnny Bridge, MD   ASSISTANT:  Joya Gaskins, OPA-C, present and scrubbed throughout the case, critical for assistance with exposure, retraction, instrumentation, and closure.   OPERATIVE IMPLANTS: Depuy PFC Sigma, Posterior Stabilized.  Femur size 3, Tibia size 3, Patella size 41 3-peg oval button, with a 10 mm polyethylene insert.   PREOPERATIVE INDICATIONS:  [name] Elliott is a 81 y.o. year old male with end stage bone on bone degenerative arthritis of the knee who failed conservative treatment, including injections, antiinflammatories, activity modification, and assistive devices, and had significant impairment of their activities of daily living, and elected for Total Knee Arthroplasty.   The risks, benefits, and alternatives were discussed at length including but not limited to the risks of infection, bleeding, nerve injury, stiffness, blood clots, the need for revision surgery, cardiopulmonary complications, among others, and they were willing to proceed.  OPERATIVE FINDINGS AND UNIQUE ASPECTS OF THE CASE:  Cut femur at 48mm, cut tibia at 2 clicks over and 22 on slope.    OPERATIVE DESCRIPTION:  The patient was brought to the operative room and placed in a supine position.  General anesthesia was administered.  IV antibiotics were given.  The lower extremity was prepped and draped in the usual sterile fashion.  Time out was performed.  The leg was elevated and exsanguinated and the tourniquet was inflated.  Anterior quadriceps tendon splitting approach was performed.  The patella was everted and osteophytes were removed.  The anterior horn of the medial and lateral meniscus was removed.   The distal femur was opened with the drill and the  intramedullary distal femoral cutting jig was utilized, set at 5 degrees resecting 10 mm off the distal femur.  Care was taken to protect the collateral ligaments.  Then the extramedullary tibial cutting jig was utilized making the appropriate cut using the anterior tibial crest as a reference building in appropriate posterior slope.  Care was taken during the cut to protect the medial and collateral ligaments.  The proximal tibia was removed along with the posterior horns of the menisci.  The PCL was sacrificed.    The extensor gap was measured and was approximately 30mm.    The distal femoral sizing jig was applied, taking care to avoid notching.  Then the 4-in-1 cutting jig was applied and the anterior and posterior femur was cut, along with the chamfer cuts.  All posterior osteophytes were removed.  The flexion gap was then measured and was symmetric with the extension gap.  I completed the distal femoral preparation using the appropriate jig to prepare the box.  The patella was then measured, and cut with the saw.  The thickness before the cut was 24 and after the cut was 15.  The proximal tibia sized and prepared accordingly with the reamer and the punch, and then all components were trialed with the 53mm poly insert.  The knee was found to have excellent balance and full motion.    The above named components were then cemented into place and all excess cement was removed.  The real polyethylene implant was placed.  After the cement had cured I released the tourniquet and confirmed excellent hemostasis with no major posterior vessel injury.    The knee was easily  taken through a range of motion and the patella tracked well and the knee irrigated copiously and the parapatellar and subcutaneous tissue closed with vicryl, and monocryl with steri strips for the skin.  The wounds were injected with marcaine, and dressed with sterile gauze and the patient was awakened and returned to the PACU in  stable and satisfactory condition.  There were no complications.  Total tourniquet time was 70 minutes.

## 2015-07-12 NOTE — Progress Notes (Signed)
Report given to jena rn as caregiver 

## 2015-07-12 NOTE — H&P (Signed)
PREOPERATIVE H&P  Chief Complaint: OA LEFT KNEE  HPI: [name] Elliott is a 81 y.o. male who presents for preoperative history and physical with a diagnosis of OA LEFT KNEE. Symptoms are rated as moderate to severe, and have been worsening.  This is significantly impairing activities of daily living.  He has elected for surgical management.   He has failed injections, activity modification, anti-inflammatories, and assistive devices.  Preoperative X-rays demonstrate end stage degenerative changes with osteophyte formation, loss of joint space, subchondral sclerosis.   Past Medical History  Diagnosis Date  . Constipation, chronic   . Sebaceous cyst   . Other malaise and fatigue   . Pain in joint, shoulder region     Has chronic shoulder pain  . Persistent disorder of initiating or maintaining sleep   . Thrombocytopenia, unspecified ([place])   . Vascular dementia without behavioral disturbance     with periods of amnesia  . Other B-complex deficiencies   . Pain in limb   . Esophageal reflux   . Unspecified essential hypertension   . Arthropathy, unspecified, site unspecified   . Pure hypercholesterolemia   . Orthostasis   . PVC's (premature ventricular contractions)   . S/P cardiac cath 08/08/11    mild to moderate CAD primarily in the LAD. None are obstructive and appear stable from prior cath in 2007; managed medically  . Arthritis   . CAD (coronary artery disease)     last cath in 2012. Managed medically-some blockages  . Headache(784.0)   . 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 MRI Nov '11 - tendonosis and partial tear. Left shoulder surgery Jan '12 for partial shoulder replacement. [name])   . Pneumonia 1980's?; 2011  . History of stomach ulcers 11/2012  . Chronic lower back pain   . Atrial fibrillation ([place])   . DVT (deep venous thrombosis) ([place])   . Stroke ([place])   . Dysrhythmia   . Hypothyroidism     pt states he doeen't have   . Diabetes  mellitus without complication ([place])     pt states he doesn't have  . Blood dyscrasia     thromboctopenia pt family states he was never told of this   Past Surgical History  Procedure Laterality Date  . Hand surgery Right     crush injury, right fifth digit contracture, limited  motion  . Lumbar spine surgery  2007    Dr Philip Aspen ([place])  . Knee surgery Left 1991    "did it twice in 1 wk" (02/17/2013)  . Cataract extraction Left 2009  . Shoulder open rotator cuff repair Left 8.30.2011  . US echocardiography  02-28-2010    Est EF 50-55%  . Cardiac catheterization  08/2011  . Shoulder hemi-arthroplasty    . Finger amputation Right     pinky finger  . Hardware removal  02/05/2012    Procedure: HARDWARE REMOVAL;  Surgeon: Johnny Bridge, MD;  Location: [facility];  Service: Orthopedics;  Laterality: Left;  . Reverse shoulder arthroplasty  02/05/2012    Procedure: REVERSE SHOULDER ARTHROPLASTY;  Surgeon: Johnny Bridge, MD;  Location: [place];  Service: Orthopedics;  Laterality: Left;  . Back surgery    . Esophagogastroduodenoscopy N/A 11/19/2012    Procedure: ESOPHAGOGASTRODUODENOSCOPY (EGD);  Surgeon: Jerene Bears, MD;  Location: [facility];  Service: Gastroenterology;  Laterality: N/A;  . Colonoscopy     Social History   Social History  . Marital Status: Widowed    Spouse Name:  N/A  . Number of Children: 2  . Years of Education: N/A   Occupational History  . retired    Social History Main Topics  . Smoking status: Former Smoker    Types: Cigars    Quit date: 09/03/1978  . Smokeless tobacco: Current User    Types: Chew     Comment: 02/17/2013 "I aien't smoked a cigar in 20-30 yr"  . Alcohol Use: No  . Drug Use: No  . Sexual Activity: No   Other Topics Concern  . None   Social History Narrative   2nd grade education. Married '58, '95. 2 dtr '59, '64, youngest daughter died septic kidney.    Lives alone, handles all ADLs      Has living will   Daughter [name] is  health care POA   Would accept resuscitation attempts--- but no prolonged ventilation.   Not sure about tube feeds.               Family History  Problem Relation Age of Onset  . Breast cancer Mother   . Cancer Mother   . Diabetes Mother   . Cancer Brother     throat  . Stroke Brother   . Diabetes Daughter   . Colon cancer Neg Hx   . Heart attack Neg Hx   . Kidney disease Father   . Diabetes Sister    Allergies  Allergen Reactions  . Crestor [Rosuvastatin Calcium] Other (See Comments)    Muscle pain/aches   Prior to Admission medications   Medication Sig Start Date End Date Taking? Authorizing Provider  aspirin EC 81 MG tablet Take 81 mg by mouth daily.    Yes Historical Provider, MD  furosemide (LASIX) 40 MG tablet Take 40 mg by mouth daily.    Yes Historical Provider, MD  gabapentin (NEURONTIN) 300 MG capsule Take 2 capsules by mouth 3 times a day 04/19/15  Yes Venia Carbon, MD  HYDROcodone-acetaminophen (NORCO) 5-325 MG per tablet Take 1 tablet by mouth every 6 (six) hours as needed for moderate pain. 02/22/15  Yes Lupita Dawn, MD  isosorbide mononitrate (IMDUR) 60 MG 24 hr tablet Take 1 tablet (60 mg total) by mouth every morning. 06/13/15  Yes Thayer Headings, MD  omeprazole (PRILOSEC) 20 MG capsule Take 1 capsule by mouth twice a day before a meal 06/13/15  Yes Venia Carbon, MD  potassium chloride SA (K-DUR,KLOR-CON) 20 MEQ tablet Take 1 tablet (20 mEq total) by mouth 2 (two) times daily. 05/03/15  Yes Thayer Headings, MD  Rivaroxaban (XARELTO) 15 MG TABS tablet Take 1 tablet (15 mg total) by mouth daily. 02/02/15  Yes Lupita Dawn, MD  diclofenac sodium (VOLTAREN) 1 % GEL Apply 1 application topically 3 (three) times daily as needed (knee pain).    Historical Provider, MD  psyllium (METAMUCIL) 58.6 % powder Take 1 packet by mouth daily as needed (constipation).    Historical Provider, MD  senna-docusate (SENOKOT-S) 8.6-50 MG per tablet Take 2 tablets by mouth  daily. Patient not taking: Reported on 06/30/2015 12/28/14   Lupita Dawn, MD     Positive ROS: All other systems have been reviewed and were otherwise negative with the exception of those mentioned in the HPI and as above.  Physical Exam: General: Alert, no acute distress Cardiovascular: No pedal edema Respiratory: No cyanosis, no use of accessory musculature GI: No organomegaly, abdomen is soft and non-tender Skin: No lesions in the area of chief complaint  Neurologic: Sensation intact distally Psychiatric: Patient is competent for consent with normal mood and affect Lymphatic: No axillary or cervical lymphadenopathy  MUSCULOSKELETAL: left knee with rom 10-90 degrees, severe crepitance, varus, pain with ROM.  Assessment: OA LEFT KNEE   Plan: Plan for Procedure(s): TOTAL KNEE ARTHROPLASTY  The risks benefits and alternatives were discussed with the patient including but not limited to the risks of nonoperative treatment, versus surgical intervention including infection, bleeding, nerve injury,  blood clots, cardiopulmonary complications, morbidity, mortality, among others, and they were willing to proceed.   Johnny Bridge, MD Cell [phone]   07/12/2015 7:15 AM

## 2015-07-12 NOTE — Anesthesia Procedure Notes (Addendum)
Anesthesia Regional Block:  Adductor canal block  Pre-Anesthetic Checklist: ,, timeout performed, Correct Patient, Correct Site, Correct Laterality, Correct Procedure, Correct Position, site marked, Risks and benefits discussed,  Surgical consent,  Pre-op evaluation,  At surgeon's request and post-op pain management  Laterality: Left  Prep: chloraprep       Needles:  Injection technique: Single-shot  Needle Type: Echogenic Stimulator Needle          Additional Needles:  Procedures: ultrasound guided (picture in chart) Adductor canal block Narrative:  Injection made incrementally with aspirations every 5 mL.  Performed by: Personally  Anesthesiologist: JUDD, MARY  Additional Notes: Risks, benefits and alternative to block explained extensively.  Patient tolerated procedure well, without complications.   Procedure Name: Intubation Date/Time: 07/12/2015 7:37 AM Performed by: Salli Quarry Dre Gamino Pre-anesthesia Checklist: Patient identified, Emergency Drugs available, Suction available and Patient being monitored Patient Re-evaluated:Patient Re-evaluated prior to inductionOxygen Delivery Method: Circle system utilized Preoxygenation: Pre-oxygenation with 100% oxygen Intubation Type: IV induction Ventilation: Mask ventilation without difficulty and Oral airway inserted - appropriate to patient size Laryngoscope Size: Sabra Heck and 2 Grade View: Grade I Tube type: Oral Tube size: 7.0 mm Number of attempts: 1 Airway Equipment and Method: Stylet Placement Confirmation: ETT inserted through vocal cords under direct vision,  positive ETCO2 and breath sounds checked- equal and bilateral Secured at: 22 cm Tube secured with: Tape Dental Injury: Teeth and Oropharynx as per pre-operative assessment

## 2015-07-12 NOTE — Anesthesia Postprocedure Evaluation (Signed)
  Anesthesia Post-op Note  Patient: [name] Elliott  Procedure(s) Performed: Procedure(s) (LRB): TOTAL KNEE ARTHROPLASTY (Left)  Patient Location: PACU  Anesthesia Type: General  Level of Consciousness: awake and alert   Airway and Oxygen Therapy: Patient Spontanous Breathing  Post-op Pain: mild  Post-op Assessment: Post-op Vital signs reviewed, Patient's Cardiovascular Status Stable, Respiratory Function Stable, Patent Airway and No signs of Nausea or vomiting  Last Vitals:  Filed Vitals:   07/12/15 1059  BP: 129/82  Pulse: 68  Temp:   Resp: 19    Post-op Vital Signs: stable   Complications: No apparent anesthesia complications

## 2015-07-12 NOTE — Progress Notes (Signed)
OT Cancellation Note  Patient Details Name: [name] Elliott MRN: [id number] DOB: 04/09/1928   Cancelled Treatment:    Reason Eval/Treat Not Completed:  (OT screened) Pt's current D/C plan is SNF. No apparent immediate acute care OT needs, therefore will defer OT to SNF. If OT eval is needed please call Acute Rehab Dept. at [phone] or text page OT at [phone].    Benito Mccreedy OTR/L [phone] 07/12/2015, 5:07 PM

## 2015-07-12 NOTE — Transfer of Care (Signed)
Immediate Anesthesia Transfer of Care Note  Patient: [name] Elliott  Procedure(s) Performed: Procedure(s): TOTAL KNEE ARTHROPLASTY (Left)  Patient Location: PACU  Anesthesia Type:General and Regional  Level of Consciousness: awake, alert , oriented and patient cooperative  Airway & Oxygen Therapy: Patient Spontanous Breathing and Patient connected to nasal cannula oxygen  Post-op Assessment: Report given to RN and Post -op Vital signs reviewed and stable  Post vital signs: Reviewed and stable  Last Vitals:  Filed Vitals:   07/12/15 0618  BP: 128/60  Pulse: 59  Temp: 35.9 C  Resp: 16    Complications: No apparent anesthesia complications

## 2015-07-12 NOTE — Evaluation (Signed)
Physical Therapy Evaluation Patient Details Name: [name] Elliott MRN: [id number] DOB: [birth year] Today's Date: 07/12/2015   History of Present Illness  Patient is a 81 y/o male s/p L TKA. PMH includes HTN, orthostasis, PVCs, CAD, chronic low back pain, A-fib, Dm. DVT, L TSA.  Clinical Impression  Patient presents with pain and post surgical deficits LLE s/p L TKA. Instructed pt in exercises. Tolerated transfers and ambulation with Min A for balance/safety. Pt independent PTA however lives alone. Would benefit from [facility] SNF to maximize independence and mobility prior to return home.     Follow Up Recommendations SNF    Equipment Recommendations  None recommended by PT (defer to next venue)    Recommendations for Other Services       Precautions / Restrictions Precautions Precautions: Knee Precaution Booklet Issued: No Precaution Comments: Reviewed no pillow under knee and precautions. Required Braces or Orthoses: Knee Immobilizer - Left Knee Immobilizer - Left: Other (comment);On when out of bed or walking (D/c POD 1) Restrictions Weight Bearing Restrictions: Yes LLE Weight Bearing: Weight bearing as tolerated      Mobility  Bed Mobility Overal bed mobility: Needs Assistance Bed Mobility: Supine to Sit     Supine to sit: Min guard;HOB elevated     General bed mobility comments: Able to bring LLE to EOB without assist. Use of rails for support.   Transfers Overall transfer level: Needs assistance Equipment used: Rolling walker (2 wheeled) Transfers: Sit to/from Stand Sit to Stand: Min guard         General transfer comment: Min guard for safety. Stood from [employer]. Transferred to chair post ambulation bout.  Ambulation/Gait Ambulation/Gait assistance: Min assist Ambulation Distance (Feet): 6 Feet Assistive device: Rolling walker (2 wheeled) Gait Pattern/deviations: Step-to pattern;Decreased stride length;Decreased stance time - left;Decreased step length -  right;Trunk flexed Gait velocity: decreased   General Gait Details: Min A for balance. Manual cues for RW proximity/management as pt pushing RW too far anterior.   Stairs            Wheelchair Mobility    Modified Rankin (Stroke Patients Only)       Balance Overall balance assessment: Needs assistance Sitting-balance support: Feet supported;No upper extremity supported Sitting balance-Leahy Scale: Fair     Standing balance support: During functional activity Standing balance-Leahy Scale: Poor Standing balance comment: Relient on RW for support.                              Pertinent Vitals/Pain Pain Assessment: Faces Faces Pain Scale: Hurts a little bit Pain Location: left knee Pain Descriptors / Indicators: Sore Pain Intervention(s): Monitored during session;Repositioned    Home Living Family/patient expects to be discharged to:: Skilled nursing facility Living Arrangements: Alone Available Help at Discharge: Family;Available PRN/intermittently Type of Home: House Home Access: Stairs to enter Entrance Stairs-Rails: None Entrance Stairs-Number of Steps: 1 Home Layout: One level Home Equipment: Cane - single point;Walker - 2 wheels      Prior Function Level of Independence: Independent with assistive device(s)         Comments: Reports cooking, cleaning, driving. Uses SPC.     Hand Dominance   Dominant Hand: Right    Extremity/Trunk Assessment   Upper Extremity Assessment: Defer to OT evaluation           Lower Extremity Assessment: LLE deficits/detail   LLE Deficits / Details: Limited AROM/strength secondary to pain and surgery.  Communication   Communication: No difficulties  Cognition Arousal/Alertness: Awake/alert Behavior During Therapy: WFL for tasks assessed/performed Overall Cognitive Status: Within Functional Limits for tasks assessed                      General Comments      Exercises Total Joint  Exercises Ankle Circles/Pumps: Both;10 reps;Supine Quad Sets: Both;10 reps;Supine Gluteal Sets: Both;10 reps;Seated      Assessment/Plan    PT Assessment Patient needs continued PT services  PT Diagnosis Difficulty walking;Acute pain   PT Problem List Decreased strength;Pain;Decreased activity tolerance;Decreased balance;Decreased mobility;Decreased knowledge of use of DME;Decreased range of motion  PT Treatment Interventions Balance training;Gait training;Functional mobility training;Therapeutic activities;Therapeutic exercise;Patient/family education;DME instruction   PT Goals (Current goals can be found in the Care Plan section) Acute Rehab PT Goals Patient Stated Goal: none stated PT Goal Formulation: With patient Time For Goal Achievement: 07/26/15 Potential to Achieve Goals: Fair    Frequency 7X/week   Barriers to discharge Decreased caregiver support Pt lives alone    Co-evaluation               End of Session Equipment Utilized During Treatment: Gait belt;Left knee immobilizer Activity Tolerance: Patient tolerated treatment well Patient left: in chair;with call bell/phone within reach Nurse Communication: Mobility status         Time: 1357-1416 PT Time Calculation (min) (ACUTE ONLY): 19 min   Charges:   PT Evaluation $Initial PT Evaluation Tier I: 1 Procedure     PT G Codes:        Bao Bazen A Elandra Powell 07/12/2015, 2:22 PM Wray Kearns, [place], DPT [phone]

## 2015-07-12 NOTE — Care Management (Signed)
Utilization review completed. Cloris Flippo, RN Case Manager [phone]. 

## 2015-07-12 NOTE — Discharge Instructions (Signed)

## 2015-07-12 NOTE — Anesthesia Preprocedure Evaluation (Addendum)
Anesthesia Evaluation  Patient identified by MRN, date of birth, ID band Patient awake    Reviewed: Allergy & Precautions, NPO status , Patient's Chart, lab work & pertinent test results  History of Anesthesia Complications Negative for: history of anesthetic complications  Airway Mallampati: III  TM Distance: >3 FB Neck ROM: Full    Dental no notable dental hx. (+) Dental Advisory Given, Edentulous Upper, Edentulous Lower   Pulmonary former smoker,    Pulmonary exam normal breath sounds clear to auscultation       Cardiovascular hypertension, Pt. on medications + CAD and + Peripheral Vascular Disease  Normal cardiovascular exam Rhythm:Regular Rate:Normal  Cardiac clearance, see last cardiology note  Echo 08/02/2014:  - Left ventricle: The cavity size was normal. Wall thickness was normal. Systolic function was mildly reduced. The estimated ejection fraction was in the range of 45% to 50%. Diffuse hypokinesis. Doppler parameters are consistent with abnormal left ventricular relaxation (grade 1 diastolic dysfunction). - Aortic valve: There was mild to moderate regurgitation. - Mitral valve: There was mild regurgitation.   Neuro/Psych  Headaches, PSYCHIATRIC DISORDERS Vascular dementia TIACVA negative psych ROS   GI/Hepatic Neg liver ROS, GERD  ,  Endo/Other  diabetes, Type 2, Oral Hypoglycemic AgentsHypothyroidism   Renal/GU Renal disease  negative genitourinary   Musculoskeletal  (+) Arthritis ,   Abdominal   Peds negative pediatric ROS (+)  Hematology negative hematology ROS (+)   Anesthesia Other Findings DVT, on xarelto  Reproductive/Obstetrics negative OB ROS                            Anesthesia Physical Anesthesia Plan  ASA: III  Anesthesia Plan: General   Post-op Pain Management: GA combined w/ Regional for post-op pain   Induction: Intravenous  Airway Management  Planned: LMA  Additional Equipment:   Intra-op Plan:   Post-operative Plan: Extubation in OR  Informed Consent: I have reviewed the patients History and Physical, chart, labs and discussed the procedure including the risks, benefits and alternatives for the proposed anesthesia with the patient or authorized representative who has indicated his/her understanding and acceptance.   Dental advisory given  Plan Discussed with: CRNA  Anesthesia Plan Comments:         Anesthesia Quick Evaluation

## 2015-07-13 ENCOUNTER — Encounter (HOSPITAL_COMMUNITY): Payer: Self-pay | Admitting: Orthopedic Surgery

## 2015-07-13 LAB — CBC
HEMATOCRIT: 31.4 % — AB (ref 39.0–52.0)
Hemoglobin: 10.1 g/dL — ABNORMAL LOW (ref 13.0–17.0)
MCH: 27.9 pg (ref 26.0–34.0)
MCHC: 32.2 g/dL (ref 30.0–36.0)
MCV: 86.7 fL (ref 78.0–100.0)
Platelets: 188 10*3/uL (ref 150–400)
RBC: 3.62 MIL/uL — ABNORMAL LOW (ref 4.22–5.81)
RDW: 15.3 % (ref 11.5–15.5)
WBC: 6.9 10*3/uL (ref 4.0–10.5)

## 2015-07-13 LAB — BASIC METABOLIC PANEL
Anion gap: 7 (ref 5–15)
BUN: 18 mg/dL (ref 6–20)
CALCIUM: 8.4 mg/dL — AB (ref 8.9–10.3)
CO2: 25 mmol/L (ref 22–32)
CREATININE: 1.94 mg/dL — AB (ref 0.61–1.24)
Chloride: 100 mmol/L — ABNORMAL LOW (ref 101–111)
GFR calc Af Amer: 35 mL/min — ABNORMAL LOW (ref >60)
GFR calc non Af Amer: 30 mL/min — ABNORMAL LOW (ref >60)
GLUCOSE: 127 mg/dL — AB (ref 65–99)
Potassium: 5.1 mmol/L (ref 3.5–5.1)
Sodium: 132 mmol/L — ABNORMAL LOW (ref 135–145)

## 2015-07-13 MED ORDER — SODIUM CHLORIDE 0.45 % IV SOLN
INTRAVENOUS | Status: DC
  Administered 2015-07-13: 11:00:00 via INTRAVENOUS

## 2015-07-13 MED ORDER — POTASSIUM CHLORIDE CRYS ER 20 MEQ PO TBCR
20.0000 meq | EXTENDED_RELEASE_TABLET | Freq: Two times a day (BID) | ORAL | Status: DC
  Administered 2015-07-14 – 2015-07-15 (×3): 20 meq via ORAL
  Filled 2015-07-13 (×3): qty 1

## 2015-07-13 NOTE — NC FL2 (Signed)
[place] LEVEL OF CARE SCREENING TOOL     IDENTIFICATION  Patient Name: [name] Elliott Birthdate: [birth year] Sex: male Admission Date (Current Location): 07/12/2015  [facility] and [place] Number: Herbalist and Address:  [facility] [place], [place] [address] [place], [place] [address]      Provider Number: [id number]  Attending Physician Name and Address:  Marchia Bond, MD  Relative Name and Phone Number:  [name], patient's daughter [phone]    Current Level of Care: Hospital Recommended Level of Care: [place] Prior Approval Number:    Date Approved/Denied:   PASRR Number:   [id number]   Discharge Plan: SNF    Current Diagnoses: Patient Active Problem List   Diagnosis Date Noted  . Primary localized osteoarthritis of left knee 07/12/2015  . Knee osteoarthritis 07/12/2015  . Debility 02/23/2015  . Superficial hematoma 01/25/2015  . Encounter for chronic pain management 01/25/2015  . Injury of right elbow 01/13/2015  . Injury of right forearm 01/13/2015  . Inguinal lymphadenopathy 12/28/2014  . DVT (deep venous thrombosis) ([place]) 12/14/2014  . Combined congestive systolic and diastolic heart failure ([place]) 12/14/2014  . Cerebral infarction due to unspecified mechanism   . TIA (transient ischemic attack) 08/01/2014  . Varicose veins of both legs with edema 05/07/2014  . Bilateral leg pain 04/16/2014  . Chronic kidney disease, stage III (moderate) 12/21/2013  . Vascular dementia without behavioral disturbance   . Type 2 diabetes mellitus, controlled, with renal complications ([place]) 37/16/9678  . Diastolic dysfunction 93/81/0175  . Knee pain, bilateral 03/01/2013  . Unspecified hereditary and idiopathic peripheral neuropathy 11/26/2012  . Osteoporosis, unspecified 11/26/2012  . GI bleed due to NSAIDs, DDX=Isch colitis, infectious colitis 11/19/2012  . Cough 05/29/2012  . Shoulder joint replacement by  other means 01/01/2012  . CONSTIPATION, CHRONIC 08/02/2010  . INSOMNIA, CHRONIC 03/08/2010  . Unspecified hypothyroidism 01/04/2010  . VITAMIN B12 DEFICIENCY 01/04/2010  . HYPERCHOLESTEROLEMIA 08/12/2009  . Essential hypertension 08/12/2009  . Coronary atherosclerosis of native coronary artery 08/12/2009  . GERD 08/12/2009    Orientation ACTIVITIES/SOCIAL BLADDER RESPIRATION    Self, Time, Situation, Place    Continent Normal  BEHAVIORAL SYMPTOMS/MOOD NEUROLOGICAL BOWEL NUTRITION STATUS      Continent Diet (Regular)  PHYSICIAN VISITS COMMUNICATION OF NEEDS Height & Weight Skin    Verbally 5\' 2"  (157.5 cm) 151 lbs. Surgical wounds          AMBULATORY STATUS RESPIRATION      Normal      Personal Care Assistance Level of Assistance  Bathing, Dressing Bathing Assistance: Limited assistance   Dressing Assistance: Limited assistance      Functional Limitations Info                SPECIAL CARE FACTORS FREQUENCY  PT (By licensed PT)     PT Frequency: 7X per week             Additional Factors Info  Code Status Code Status Info: Full Code Allergies Info: CRESTOR           Current Medications (07/13/2015): Current Facility-Administered Medications  Medication Dose Route Frequency Provider Last Rate Last Dose  . 0.45 % sodium chloride infusion   Intravenous Continuous Marchia Bond, MD 75 mL/hr at 07/13/15 1125    . acetaminophen (TYLENOL) tablet 650 mg  650 mg Oral Q6H PRN Marchia Bond, MD   650 mg at 07/13/15 2015   Or  . acetaminophen (TYLENOL) suppository 650  mg  650 mg Rectal Q6H PRN Marchia Bond, MD      . alum & mag hydroxide-simeth (MAALOX/MYLANTA) 200-200-20 MG/5ML suspension 30 mL  30 mL Oral Q4H PRN Marchia Bond, MD      . aspirin EC tablet 81 mg  81 mg Oral Daily Marchia Bond, MD   81 mg at 07/13/15 0758  . bisacodyl (DULCOLAX) suppository 10 mg  10 mg Rectal Daily PRN Marchia Bond, MD      . diphenhydrAMINE (BENADRYL) 12.5 MG/5ML elixir  12.5-25 mg  12.5-25 mg Oral Q4H PRN Marchia Bond, MD      . docusate sodium (COLACE) capsule 100 mg  100 mg Oral BID Marchia Bond, MD   100 mg at 07/13/15 2150  . furosemide (LASIX) tablet 40 mg  40 mg Oral Daily Marchia Bond, MD   40 mg at 07/13/15 0800  . gabapentin (NEURONTIN) capsule 600 mg  600 mg Oral TID Marchia Bond, MD   600 mg at 07/13/15 2150  . HYDROmorphone (DILAUDID) injection 0.5 mg  0.5 mg Intravenous Q2H PRN Marchia Bond, MD   0.5 mg at 07/12/15 2131  . isosorbide mononitrate (IMDUR) 24 hr tablet 60 mg  60 mg Oral q morning - 10a Marchia Bond, MD   Stopped at 07/13/15 0802  . magnesium citrate solution 1 Bottle  1 Bottle Oral Once PRN Marchia Bond, MD      . menthol-cetylpyridinium (CEPACOL) lozenge 3 mg  1 lozenge Oral PRN Marchia Bond, MD       Or  . phenol (CHLORASEPTIC) mouth spray 1 spray  1 spray Mouth/Throat PRN Marchia Bond, MD      . methocarbamol (ROBAXIN) tablet 500 mg  500 mg Oral Q6H PRN Marchia Bond, MD   500 mg at 07/13/15 1538   Or  . methocarbamol (ROBAXIN) 500 mg in dextrose 5 % 50 mL IVPB  500 mg Intravenous Q6H PRN Marchia Bond, MD      . metoCLOPramide (REGLAN) tablet 5-10 mg  5-10 mg Oral Q8H PRN Marchia Bond, MD       Or  . metoCLOPramide (REGLAN) injection 5-10 mg  5-10 mg Intravenous Q8H PRN Marchia Bond, MD      . ondansetron [facility]) tablet 4 mg  4 mg Oral Q6H PRN Marchia Bond, MD       Or  . ondansetron [facility]) injection 4 mg  4 mg Intravenous Q6H PRN Marchia Bond, MD      . oxyCODONE (Oxy IR/ROXICODONE) immediate release tablet 5-10 mg  5-10 mg Oral Q3H PRN Marchia Bond, MD   10 mg at 07/13/15 1538  . pantoprazole (PROTONIX) EC tablet 80 mg  80 mg Oral Daily Marchia Bond, MD   80 mg at 07/13/15 0759  . polyethylene glycol (MIRALAX / GLYCOLAX) packet 17 g  17 g Oral Daily PRN Marchia Bond, MD      . Derrill Memo ON 07/14/2015] potassium chloride SA (K-DUR,KLOR-CON) CR tablet 20 mEq  20 mEq Oral BID Marchia Bond, MD      . Rivaroxaban  Alveda Reasons) tablet 15 mg  15 mg Oral Q supper Marchia Bond, MD   15 mg at 07/13/15 1802  . senna (SENOKOT) tablet 8.6 mg  1 tablet Oral BID Marchia Bond, MD   8.6 mg at 07/13/15 2150   Do not use this list as official medication orders. Please verify with discharge summary.  Discharge Medications:   Medication List    STOP taking these medications  diclofenac sodium 1 % Gel  Commonly known as:  VOLTAREN     HYDROcodone-acetaminophen 5-325 MG tablet  Commonly known as:  NORCO      TAKE these medications        aspirin EC 81 MG tablet  Take 81 mg by mouth daily.     baclofen 10 MG tablet  Commonly known as:  LIORESAL  Take 1 tablet (10 mg total) by mouth 3 (three) times daily. As needed for muscle spasm     furosemide 40 MG tablet  Commonly known as:  LASIX  Take 40 mg by mouth daily.     gabapentin 300 MG capsule  Commonly known as:  NEURONTIN  Take 2 capsules by mouth 3 times a day     isosorbide mononitrate 60 MG 24 hr tablet  Commonly known as:  IMDUR  Take 1 tablet (60 mg total) by mouth every morning.     omeprazole 20 MG capsule  Commonly known as:  PRILOSEC  Take 1 capsule by mouth twice a day before a meal     ondansetron 4 MG tablet  Commonly known as:  ZOFRAN  Take 1 tablet (4 mg total) by mouth every 8 (eight) hours as needed for nausea or vomiting.     oxyCODONE-acetaminophen 5-325 MG tablet  Commonly known as:  ROXICET  Take 1-2 tablets by mouth every 6 (six) hours as needed for severe pain.     potassium chloride SA 20 MEQ tablet  Commonly known as:  K-DUR,KLOR-CON  Take 1 tablet (20 mEq total) by mouth 2 (two) times daily.     psyllium 58.6 % powder  Commonly known as:  METAMUCIL  Take 1 packet by mouth daily as needed (constipation).     Rivaroxaban 15 MG Tabs tablet  Commonly known as:  XARELTO  Take 1 tablet (15 mg total) by mouth daily.     sennosides-docusate sodium 8.6-50 MG tablet  Commonly known as:  SENOKOT-S  Take 2  tablets by mouth daily.        Relevant Imaging Results:  Relevant Lab Results:  Recent Labs    Additional Information    Anterhaus, Jones Broom, LCSWA

## 2015-07-13 NOTE — Progress Notes (Signed)
Physical Therapy Treatment Patient Details Name: [name] Elliott MRN: [id number] DOB: 02/26/1928 Today's Date: 07/13/2015    History of Present Illness Patient is a 81 y/o male s/p L TKA. PMH includes HTN, orthostasis, PVCs, CAD, chronic low back pain, A-fib, Dm. DVT, L TSA.    PT Comments    Session focused on knee control with exercise & walking with activation of quad during therex and walking. Used knee immobilizer to increase stability of knee and needed assistance to extend knee against gravity. Continue to agree with SNF for post-acute rehab to maximize independence and safety with mobility. Needs continued PT to increase knee control, improved gait, & overall safety with general mobility.   Session conducted on room air with O2 sats > 93%    Follow Up Recommendations  SNF     Equipment Recommendations  Rolling walker with 5" wheels    Recommendations for Other Services       Precautions / Restrictions Precautions Precautions: Knee Precaution Booklet Issued: No Precaution Comments: Reviewed no pillow under knee and precautions. Required Braces or Orthoses: Knee Immobilizer - Left Knee Immobilizer - Left: Other (comment);On when out of bed or walking Restrictions Weight Bearing Restrictions: Yes LLE Weight Bearing: Weight bearing as tolerated    Mobility  Bed Mobility Overal bed mobility: Needs Assistance Bed Mobility: Supine to Sit     Supine to sit: Min assist     General bed mobility comments: Able to bring LLE to EOB without assist. Use of rails for support.   Transfers Overall transfer level: Needs assistance Equipment used: Rolling walker (2 wheeled) Transfers: Sit to/from Stand Sit to Stand: Mod assist         General transfer comment: mod assist for safety. Transferred to chair post ambulation.   Ambulation/Gait Ambulation/Gait assistance: Min assist Ambulation Distance (Feet): 65 Feet Assistive device: Rolling walker (2 wheeled) Gait  Pattern/deviations: Step-through pattern;Decreased step length - left;Decreased stride length;Trunk flexed Gait velocity: decreased   General Gait Details: Min A for balance. Manual cues for RW proximity/management as pt pushing RW too far anterior.    Stairs            Wheelchair Mobility    Modified Rankin (Stroke Patients Only)       Balance Overall balance assessment: Needs assistance Sitting-balance support: Feet supported;No upper extremity supported Sitting balance-Leahy Scale: Fair     Standing balance support: During functional activity   Standing balance comment: Relient on RW for support                    Cognition Arousal/Alertness: Lethargic Behavior During Therapy: WFL for tasks assessed/performed Overall Cognitive Status: Within Functional Limits for tasks assessed                      Exercises Total Joint Exercises Quad Sets: AAROM;Strengthening;Both;10 reps;Supine Short Arc Quad: AAROM;Strengthening;Left;10 reps;Supine Heel Slides: PROM;Strengthening;Left;10 reps;Supine Straight Leg Raises: AAROM;Strengthening;Left;10 reps;Supine    General Comments        Pertinent Vitals/Pain Pain Assessment: Faces Faces Pain Scale: Hurts whole lot Pain Location: Knee Pain Descriptors / Indicators: Grimacing;Aching;Dull Pain Intervention(s): Monitored during session;Repositioned    Home Living                      Prior Function            PT Goals (current goals can now be found in the care plan section) Acute Rehab PT Goals Patient Stated  Goal: none stated PT Goal Formulation: With patient Time For Goal Achievement: 07/26/15 Potential to Achieve Goals: Good    Frequency  7X/week    PT Plan Current plan remains appropriate    Co-evaluation             End of Session Equipment Utilized During Treatment: Gait belt;Left knee immobilizer Activity Tolerance: Patient tolerated treatment well Patient left: in  chair;with call bell/phone within reach     Time: 1214-1255 PT Time Calculation (min) (ACUTE ONLY): 41 min  Charges:  $Gait Training: 8-22 mins $Therapeutic Exercise: 8-22 mins $Therapeutic Activity: 8-22 mins                    G Codes:      Chaney Malling 07-22-15, 3:04 PM

## 2015-07-13 NOTE — Clinical Social Work Note (Addendum)
Clinical Social Work Assessment  Patient Details  Name: [name] Elliott MRN: [id number] Date of Birth: Nov 29, 1927  Date of referral:  07/13/15               Reason for consult:  Facility Placement                Permission sought to share information with:  Family Supports Permission granted to share information::  Yes, Verbal Permission Granted  Name::     [name]  Agency::  Snf admissions  Relationship::     Contact Information:     Housing/Transportation Living arrangements for the past 2 months:  [place] of Information:  Patient, Adult Children Patient Interpreter Needed:  None Criminal Activity/Legal Involvement Pertinent to Current Situation/Hospitalization:  No - Comment as needed Significant Relationships:  Adult Children Lives with:  Self Do you feel safe going back to the place where you live?  Yes (Patient's daughter feels patient needs therapy before she can return home) Need for family participation in patient care:  Yes (Comment) (Patient requests that his daughter help make decisions)  Care giving concerns:  Patient and daughter feel patient needs short term rehab before she can return back home.   Social Worker assessment / plan: Patient is a 81 year old male who lives alone.  Patient is alert and oriented x4 and pleasant to talk to.  Patient stated he has been in rehab in the past and was at [facility]  Patient and his daughter would like him to return back to facility for short term rehab.  Patient and his daughter were explained how SNF search process is completed and insurance pays for his stay.  Patient's daughter expressed that she only wants patient to go to [facility] and is not interested in other facilities.  Patient and his daughter did not have any other questions and gave CSW permission to fax information to SNFs.  Employment status:  Retired Nurse, adult PT Recommendations:  [place] / Referral to community resources:     Patient/Family's Response to care: Patient and his daughter are in agreement to SNF  Patient/Family's Understanding of and Emotional Response to Diagnosis, Current Treatment, and Prognosis:  Patient and daughter are aware of current treatment  And prognosis.  Emotional Assessment Appearance:  Appears stated age Attitude/Demeanor/Rapport:    Affect (typically observed):  Appropriate, Pleasant, Calm, Stable Orientation:  Oriented to Place, Oriented to Self, Oriented to  Time, Oriented to Situation Alcohol / Substance use:  Not Applicable Psych involvement (Current and /or in the community):  No (Comment)  Discharge Needs  Concerns to be addressed:  Lack of Support Readmission within the last 30 days:  No Current discharge risk:  Lives alone Barriers to Discharge:  No Barriers Identified   Ross Ludwig, LCSWA 07/13/2015, 11:26 PM

## 2015-07-13 NOTE — Progress Notes (Signed)
Patient ID: [name] Elliott, male   DOB: [birth year], 81 y.o.   MRN: [id number]     Subjective:  Patient reports pain as mild to moderate.  Patient in bed and resting in no acute distress.  Denies any CP or SOB.  Had difficulty voiding, I/O cath last night.  Objective:   VITALS:   Filed Vitals:   07/12/15 1749 07/12/15 2222 07/13/15 0110 07/13/15 0500  BP: 119/58 114/52 121/60 108/45  Pulse: 59 84 79 75  Temp:  97.5 F (36.4 C) 97.6 F (36.4 C) 97.8 F (36.6 C)  TempSrc:  Oral Oral Oral  Resp:  16 16 16   Height:      Weight:      SpO2:  96% 100% 96%    ABD soft Sensation intact distally Dorsiflexion/Plantar flexion intact Incision: dressing C/D/I and no drainage Good foot and ankle motion  Lab Results  Component Value Date   WBC 6.9 07/13/2015   HGB 10.1* 07/13/2015   HCT 31.4* 07/13/2015   MCV 86.7 07/13/2015   PLT 188 07/13/2015   BMET    Component Value Date/Time   NA 132* 07/13/2015 0515   K 5.1 07/13/2015 0515   CL 100* 07/13/2015 0515   CO2 25 07/13/2015 0515   GLUCOSE 127* 07/13/2015 0515   BUN 18 07/13/2015 0515   CREATININE 1.94* 07/13/2015 0515   CALCIUM 8.4* 07/13/2015 0515   GFRNONAA 30* 07/13/2015 0515   GFRAA 35* 07/13/2015 0515     Assessment/Plan: 1 Day Post-Op   Principal Problem:   Primary localized osteoarthritis of left knee Active Problems:   Knee osteoarthritis  Chronic renal insufficiency, slightly worse, continue monitoring, and give IVF, recheck labs tomorrow. Urinary retention, monitor for UOP today.  Advance diet Up with therapy Plan for discharge tomorrow or friday WBAT Plan for Dressing change tomorrow   Remonia Richter 07/13/2015, 7:14 AM  Seen and agree.  Marchia Bond, MD Cell [phone]

## 2015-07-14 LAB — BASIC METABOLIC PANEL
Anion gap: 7 (ref 5–15)
BUN: 21 mg/dL — AB (ref 6–20)
CHLORIDE: 100 mmol/L — AB (ref 101–111)
CO2: 24 mmol/L (ref 22–32)
CREATININE: 1.74 mg/dL — AB (ref 0.61–1.24)
Calcium: 8.5 mg/dL — ABNORMAL LOW (ref 8.9–10.3)
GFR calc Af Amer: 39 mL/min — ABNORMAL LOW (ref >60)
GFR calc non Af Amer: 34 mL/min — ABNORMAL LOW (ref >60)
Glucose, Bld: 148 mg/dL — ABNORMAL HIGH (ref 65–99)
Potassium: 4.5 mmol/L (ref 3.5–5.1)
SODIUM: 131 mmol/L — AB (ref 135–145)

## 2015-07-14 LAB — CBC
HCT: 29 % — ABNORMAL LOW (ref 39.0–52.0)
HEMOGLOBIN: 9.4 g/dL — AB (ref 13.0–17.0)
MCH: 27.8 pg (ref 26.0–34.0)
MCHC: 32.4 g/dL (ref 30.0–36.0)
MCV: 85.8 fL (ref 78.0–100.0)
Platelets: 158 10*3/uL (ref 150–400)
RBC: 3.38 MIL/uL — ABNORMAL LOW (ref 4.22–5.81)
RDW: 15.2 % (ref 11.5–15.5)
WBC: 7.2 10*3/uL (ref 4.0–10.5)

## 2015-07-14 NOTE — Discharge Summary (Addendum)
Physician Discharge Summary  Patient ID: [name] Elliott MRN: [id number] DOB/AGE: [birth year] 81 y.o.  Admit date: 07/12/2015 Discharge date: 07/15/2015  Admission Diagnoses:  Primary localized osteoarthritis of left knee  Discharge Diagnoses:  Principal Problem:   Primary localized osteoarthritis of left knee Active Problems:   Knee osteoarthritis acute on chronic renal insufficiency hyperkalemia  Past Medical History  Diagnosis Date  . Constipation, chronic   . Sebaceous cyst   . Other malaise and fatigue   . Pain in joint, shoulder region     Has chronic shoulder pain  . Persistent disorder of initiating or maintaining sleep   . Thrombocytopenia, unspecified ([place])   . Vascular dementia without behavioral disturbance     with periods of amnesia  . Other B-complex deficiencies   . Pain in limb   . Esophageal reflux   . Unspecified essential hypertension   . Arthropathy, unspecified, site unspecified   . Pure hypercholesterolemia   . Orthostasis   . PVC's (premature ventricular contractions)   . S/P cardiac cath 08/08/11    mild to moderate CAD primarily in the LAD. None are obstructive and appear stable from prior cath in 2007; managed medically  . Arthritis   . CAD (coronary artery disease)     last cath in 2012. Managed medically-some blockages  . Headache(784.0)   . 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 MRI Nov '11 - tendonosis and partial tear. Left shoulder surgery Jan '12 for partial shoulder replacement. [name] Elliott)   . Pneumonia 1980's?; 2011  . History of stomach ulcers 11/2012  . Chronic lower back pain   . Atrial fibrillation ([place])   . DVT (deep venous thrombosis) ([place])   . Stroke ([place])   . Dysrhythmia   . Hypothyroidism     pt states he doeen't have   . Diabetes mellitus without complication ([place])     pt states he doesn't have  . Blood dyscrasia     thromboctopenia pt family states he was never told of this  . Primary  localized osteoarthritis of left knee 07/12/2015    Surgeries: Procedure(s): TOTAL KNEE ARTHROPLASTY on 07/12/2015   Consultants (if any):    Discharged Condition: Improved  Hospital Course: [name] Elliott is an 81 y.o. male who was admitted 07/12/2015 with a diagnosis of Primary localized osteoarthritis of left knee and went to the operating room on 07/12/2015 and underwent the above named procedures.    He was given perioperative antibiotics:  Anti-infectives    Start     Dose/Rate Route Frequency Ordered Stop   07/12/15 1400  ceFAZolin (ANCEF) IVPB 2 g/50 mL premix     2 g 100 mL/hr over 30 Minutes Intravenous Every 6 hours 07/12/15 1221 07/13/15 0159   07/12/15 0543  ceFAZolin (ANCEF) IVPB 2 g/50 mL premix     2 g 100 mL/hr over 30 Minutes Intravenous On call to O.R. 07/12/15 0543 07/12/15 0745   07/12/15 0529  ceFAZolin (ANCEF) 2-3 GM-% IVPB SOLR    Comments:  [name] Elliott   : cabinet override      07/12/15 0529 07/12/15 1744    .  He was given sequential compression devices, early ambulation, and xarelto for DVT prophylaxis.  He has some renal insufficiency with hyperkalemia improved with iv fluids, and by reducing his oral and iv potassium intake.  He benefited maximally from the hospital stay and there were no complications.    Recent vital signs:  Filed Vitals:   07/14/15 0500  BP: 102/46  Pulse: 74  Temp: 97.7 F (36.5 C)  Resp: 20    Recent laboratory studies:  Lab Results  Component Value Date   HGB 9.4* 07/14/2015   HGB 10.1* 07/13/2015   HGB 13.4 07/01/2015   Lab Results  Component Value Date   WBC 7.2 07/14/2015   PLT 158 07/14/2015   Lab Results  Component Value Date   INR 1.13 07/12/2015   Lab Results  Component Value Date   NA 131* 07/14/2015   K 4.5 07/14/2015   CL 100* 07/14/2015   CO2 24 07/14/2015   BUN 21* 07/14/2015   CREATININE 1.74* 07/14/2015   GLUCOSE 148* 07/14/2015    Discharge Medications:     Medication List     STOP taking these medications        diclofenac sodium 1 % Gel  Commonly known as:  VOLTAREN     HYDROcodone-acetaminophen 5-325 MG tablet  Commonly known as:  NORCO      TAKE these medications        aspirin EC 81 MG tablet  Take 81 mg by mouth daily.     baclofen 10 MG tablet  Commonly known as:  LIORESAL  Take 1 tablet (10 mg total) by mouth 3 (three) times daily. As needed for muscle spasm     furosemide 40 MG tablet  Commonly known as:  LASIX  Take 40 mg by mouth daily.     gabapentin 300 MG capsule  Commonly known as:  NEURONTIN  Take 2 capsules by mouth 3 times a day     isosorbide mononitrate 60 MG 24 hr tablet  Commonly known as:  IMDUR  Take 1 tablet (60 mg total) by mouth every morning.     omeprazole 20 MG capsule  Commonly known as:  PRILOSEC  Take 1 capsule by mouth twice a day before a meal     ondansetron 4 MG tablet  Commonly known as:  ZOFRAN  Take 1 tablet (4 mg total) by mouth every 8 (eight) hours as needed for nausea or vomiting.     oxyCODONE-acetaminophen 5-325 MG tablet  Commonly known as:  ROXICET  Take 1-2 tablets by mouth every 6 (six) hours as needed for severe pain.     potassium chloride SA 20 MEQ tablet  Commonly known as:  K-DUR,KLOR-CON  Take 1 tablet (20 mEq total) by mouth 2 (two) times daily.     psyllium 58.6 % powder  Commonly known as:  METAMUCIL  Take 1 packet by mouth daily as needed (constipation).     Rivaroxaban 15 MG Tabs tablet  Commonly known as:  XARELTO  Take 1 tablet (15 mg total) by mouth daily.     sennosides-docusate sodium 8.6-50 MG tablet  Commonly known as:  SENOKOT-S  Take 2 tablets by mouth daily.        Diagnostic Studies: Dg Knee Complete 4 Views Left  06/18/2015  CLINICAL DATA:  Knee pain with swelling. EXAM: LEFT KNEE - COMPLETE 4+ VIEW COMPARISON:  02/17/2013 FINDINGS: Tricompartmental degenerative change. Chondrocalcinosis. Moderate joint space narrowing and spurring medially. Mild joint  space narrowing laterally. Patellofemoral degenerative change in spurring. Small joint effusion. Negative for fracture Arterial calcification IMPRESSION: Tricompartmental degenerative change and effusion. No acute abnormality and no change from the prior study Electronically Signed   By: Franchot Gallo M.D.   On: 06/18/2015 16:28   Dg Knee Left Port  07/12/2015  CLINICAL DATA:  Status post total left knee  arthroplasty this morning EXAM: PORTABLE LEFT KNEE - 1-2 VIEW COMPARISON:  06/18/2015 FINDINGS: Anticipated soft tissue postoperative change over the left knee. Components of tricompartmental knee prosthesis in anticipated position. There is femoral and popliteal artery calcification. IMPRESSION: Anticipated postoperative appearance Electronically Signed   By: Skipper Cliche M.D.   On: 07/12/2015 11:05    Disposition: 07-Left Against Medical Advice        Follow-up Information    Follow up with Johnny Bridge, MD. Schedule an appointment as soon as possible for a visit in 2 weeks.   Specialty:  Orthopedic Surgery   Contact information:   [place] [address] [phone]        Signed: Johnny Bridge 07/14/2015, 9:42 AM

## 2015-07-14 NOTE — Clinical Social Work Note (Signed)
Patient has accepted bed at [facility].  CSW remains available as needed.   Glendon Axe, MSW, LCSWA [phone] 07/14/2015 12:40 PM

## 2015-07-14 NOTE — Progress Notes (Signed)
[name] Elliott has current discharge orders for 07/15/15 to [facility]. [facility] has been contacted and I spoke with Charlsie Merles, RN at the facility to inform providers that the pt would arrive on tomorrow, 07/15/15 after discharge from [facility]. Pts daughter [name], updated with pt current discharge status. Will continue to monitor the pt. [name]

## 2015-07-14 NOTE — Progress Notes (Signed)
Patient ID: [name] Elliott, male   DOB: [birth year], 81 y.o.   MRN: [id number]     Subjective:  Patient reports pain as mild to moderate.  Patient in the bed resting.  Denies any CP or SOB.  Alert to time and place  Objective:   VITALS:   Filed Vitals:   07/13/15 1300 07/13/15 1948 07/14/15 0036 07/14/15 0500  BP: 119/58 108/57 106/62 102/46  Pulse: 68 83 89 74  Temp: 98.1 F (36.7 C) 99.2 F (37.3 C) 98.1 F (36.7 C) 97.7 F (36.5 C)  TempSrc: Oral Oral Oral Oral  Resp: 18 18 20 20   Height:      Weight:      SpO2: 94% 98% 100% 99%    ABD soft Sensation intact distally Dorsiflexion/Plantar flexion intact Incision: dressing C/D/I and no drainage Dressing changed and would is good  Lab Results  Component Value Date   WBC 7.2 07/14/2015   HGB 9.4* 07/14/2015   HCT 29.0* 07/14/2015   MCV 85.8 07/14/2015   PLT 158 07/14/2015   BMET    Component Value Date/Time   NA 131* 07/14/2015 0753   K 4.5 07/14/2015 0753   CL 100* 07/14/2015 0753   CO2 24 07/14/2015 0753   GLUCOSE 148* 07/14/2015 0753   BUN 21* 07/14/2015 0753   CREATININE 1.74* 07/14/2015 0753   CALCIUM 8.5* 07/14/2015 0753   GFRNONAA 34* 07/14/2015 0753   GFRAA 39* 07/14/2015 0753     Assessment/Plan: 2 Days Post-Op   Principal Problem:   Primary localized osteoarthritis of left knee Active Problems:   Knee osteoarthritis   Advance diet Up with therapy Plan for discharge tomorrow Plan for SNF WBAT Dry dressing PRN   Ryson Bacha P 07/14/2015, 9:40 AM  Agree with above.  Creatinine improved.  Plan SNF.   Marchia Bond, MD Cell [phone]

## 2015-07-14 NOTE — Progress Notes (Signed)
Physical Therapy Treatment Patient Details Name: [name] Elliott MRN: [id number] DOB: [birth year] Today's Date: 07/14/2015    History of Present Illness Patient is a 81 y/o male s/p L TKA. PMH includes HTN, orthostasis, PVCs, CAD, chronic low back pain, A-fib, Dm. DVT, L TSA.    PT Comments    Pt progressing with gait and transfers but continues to require assist for all mobility. Very limited Left knee ROm and encouraged increased HEP and ROM. Will continue to follow.   Follow Up Recommendations  SNF     Equipment Recommendations       Recommendations for Other Services       Precautions / Restrictions Precautions Precautions: Knee;Fall Required Braces or Orthoses: Knee Immobilizer - Left Knee Immobilizer - Left: Other (comment);On when out of bed or walking Restrictions Weight Bearing Restrictions: Yes LLE Weight Bearing: Weight bearing as tolerated    Mobility  Bed Mobility Overal bed mobility: Needs Assistance       Supine to sit: Supervision     General bed mobility comments: increased time with use of rail and cues for sequence and hand placement  Transfers Overall transfer level: Needs assistance   Transfers: Sit to/from Stand Sit to Stand: Min assist         General transfer comment: cues for hand placement, assist for anterior translation as pt maintains posterior lean with initial standing and increased time to shift anteriorly  Ambulation/Gait Ambulation/Gait assistance: Min guard Ambulation Distance (Feet): 150 Feet Assistive device: Rolling walker (2 wheeled) Gait Pattern/deviations: Step-through pattern;Decreased stride length;Trunk flexed   Gait velocity interpretation: Below normal speed for age/gender General Gait Details: max cues for posture, position in RW and safety. Pt tends to remain flexed with RW too far anterior   Stairs            Wheelchair Mobility    Modified Rankin (Stroke Patients Only)       Balance Overall  balance assessment: Needs assistance   Sitting balance-Leahy Scale: Fair       Standing balance-Leahy Scale: Poor                      Cognition Arousal/Alertness: Awake/alert Behavior During Therapy: Flat affect Overall Cognitive Status: Impaired/Different from baseline       Memory: Decreased short-term memory              Exercises Total Joint Exercises Short Arc Quad: AROM;Left;15 reps;Supine Heel Slides: AAROM;Left;15 reps;Supine Hip ABduction/ADduction: AROM;Left;15 reps;Supine Straight Leg Raises: AAROM;Left;15 reps;Supine Goniometric ROM: 5-45    General Comments        Pertinent Vitals/Pain Pain Score: 6  Pain Location: left knee Pain Descriptors / Indicators: Aching Pain Intervention(s): Monitored during session;Limited activity within patient's tolerance;Repositioned;Patient requesting pain meds-RN notified (pt denied ice)    Home Living                      Prior Function            PT Goals (current goals can now be found in the care plan section) Progress towards PT goals: Progressing toward goals    Frequency  7X/week    PT Plan Current plan remains appropriate    Co-evaluation             End of Session Equipment Utilized During Treatment: Gait belt;Left knee immobilizer Activity Tolerance: Patient tolerated treatment well Patient left: in chair;with call bell/phone within reach;with chair alarm set  Time: [id number] PT Time Calculation (min) (ACUTE ONLY): 30 min  Charges:  $Gait Training: 8-22 mins $Therapeutic Exercise: 8-22 mins                    G Codes:      Melford Aase 2015-07-24, 10:50 AM Elwyn Reach, [place]

## 2015-07-15 LAB — CBC
HCT: 25.4 % — ABNORMAL LOW (ref 39.0–52.0)
Hemoglobin: 8.3 g/dL — ABNORMAL LOW (ref 13.0–17.0)
MCH: 27.8 pg (ref 26.0–34.0)
MCHC: 32.7 g/dL (ref 30.0–36.0)
MCV: 84.9 fL (ref 78.0–100.0)
PLATELETS: 183 10*3/uL (ref 150–400)
RBC: 2.99 MIL/uL — AB (ref 4.22–5.81)
RDW: 15.2 % (ref 11.5–15.5)
WBC: 7.9 10*3/uL (ref 4.0–10.5)

## 2015-07-15 LAB — BASIC METABOLIC PANEL
ANION GAP: 8 (ref 5–15)
BUN: 21 mg/dL — ABNORMAL HIGH (ref 6–20)
CALCIUM: 8.4 mg/dL — AB (ref 8.9–10.3)
CO2: 24 mmol/L (ref 22–32)
Chloride: 104 mmol/L (ref 101–111)
Creatinine, Ser: 1.47 mg/dL — ABNORMAL HIGH (ref 0.61–1.24)
GFR, EST AFRICAN AMERICAN: 48 mL/min — AB (ref >60)
GFR, EST NON AFRICAN AMERICAN: 42 mL/min — AB (ref >60)
GLUCOSE: 126 mg/dL — AB (ref 65–99)
POTASSIUM: 4.8 mmol/L (ref 3.5–5.1)
SODIUM: 136 mmol/L (ref 135–145)

## 2015-07-15 MED ORDER — GABAPENTIN 300 MG PO CAPS: 600.0000 mg | ORAL_CAPSULE | Freq: Every day | ORAL | Status: DC

## 2015-07-15 MED ORDER — GABAPENTIN 300 MG PO CAPS
600.0000 mg | ORAL_CAPSULE | Freq: Every day | ORAL | Status: DC
  Administered 2015-07-15: 600 mg via ORAL
  Filled 2015-07-15 (×2): qty 2

## 2015-07-15 NOTE — Discharge Planning (Signed)
Report called to [place] at [facility].

## 2015-07-15 NOTE — Progress Notes (Signed)
Patient ID: [name] Elliott, male   DOB: Aug 17, 1928, 81 y.o.   MRN: [id number]     Subjective:  Patient reports pain as mild.  Patient is alert and working with PT.  Objective:   VITALS:   Filed Vitals:   07/14/15 0500 07/14/15 1308 07/14/15 2119 07/15/15 0644  BP: 102/46 123/63 103/47 122/50  Pulse: 74 79 81 86  Temp: 97.7 F (36.5 C) 97.7 F (36.5 C) 98.2 F (36.8 C) 98.1 F (36.7 C)  TempSrc: Oral Oral Oral Oral  Resp: 20  16 16   Height:      Weight:      SpO2: 99% 99% 98% 97%    ABD soft Sensation intact distally Dorsiflexion/Plantar flexion intact Incision: dressing C/D/I and scant drainage   Lab Results  Component Value Date   WBC 7.9 07/15/2015   HGB 8.3* 07/15/2015   HCT 25.4* 07/15/2015   MCV 84.9 07/15/2015   PLT 183 07/15/2015   BMET    Component Value Date/Time   NA 136 07/15/2015 0449   K 4.8 07/15/2015 0449   CL 104 07/15/2015 0449   CO2 24 07/15/2015 0449   GLUCOSE 126* 07/15/2015 0449   BUN 21* 07/15/2015 0449   CREATININE 1.47* 07/15/2015 0449   CALCIUM 8.4* 07/15/2015 0449   GFRNONAA 42* 07/15/2015 0449   GFRAA 48* 07/15/2015 0449     Assessment/Plan: 3 Days Post-Op   Principal Problem:   Primary localized osteoarthritis of left knee Active Problems:   Knee osteoarthritis   Advance diet Up with therapy Discharge to SNF WBAT Dry dressing PRN Follow up with Dr Mardelle Matte in 2 weeks   [name] Elliott 07/15/2015, 8:45 AM   Marchia Bond, MD Cell [phone]

## 2015-07-15 NOTE — Clinical Social Work Note (Signed)
Patient to be d/c'ed today to [facility].   Patient and family agreeable to plans will transport via ems RN to call report.  Marcellino Fidalgo, MSW, LCSWA [phone]  

## 2015-07-15 NOTE — Progress Notes (Signed)
Physical Therapy Treatment Patient Details Name: [name] Elliott MRN: [id number] DOB: 1928/03/19 Today's Date: 07/15/2015    History of Present Illness Patient is a 81 y/o male s/p L TKA. PMH includes HTN, orthostasis, PVCs, CAD, chronic low back pain, A-fib, Dm. DVT, L TSA.    PT Comments    Pt pleasant and continues to slowly progress with HEP and gait. Pt requires education and direction for KI use, RW use, HEP and functional mobility with LLE. Pt reports he has lost his upper dentures and RN notified, unable to find in room. Will continue to follow.   Follow Up Recommendations  SNF     Equipment Recommendations       Recommendations for Other Services       Precautions / Restrictions Precautions Precautions: Knee;Fall Required Braces or Orthoses: Knee Immobilizer - Left Knee Immobilizer - Left: Other (comment);On when out of bed or walking Restrictions LLE Weight Bearing: Weight bearing as tolerated    Mobility  Bed Mobility               General bed mobility comments: EOB on arrival  Transfers Overall transfer level: Needs assistance   Transfers: Sit to/from Stand Sit to Stand: Min assist         General transfer comment: cues for hand placement, assist for anterior translation to stand  Ambulation/Gait Ambulation/Gait assistance: Min guard Ambulation Distance (Feet): 170 Feet Assistive device: Rolling walker (2 wheeled) Gait Pattern/deviations: Step-through pattern;Decreased stride length;Trunk flexed   Gait velocity interpretation: Below normal speed for age/gender General Gait Details: max cues for posture, position in RW and safety. Pt tends to remain flexed with RW too far anterior   Stairs            Wheelchair Mobility    Modified Rankin (Stroke Patients Only)       Balance                                    Cognition Arousal/Alertness: Awake/alert Behavior During Therapy: Flat affect Overall Cognitive  Status: Impaired/Different from baseline Area of Impairment: Safety/judgement     Memory: Decreased short-term memory   Safety/Judgement: Decreased awareness of safety          Exercises Total Joint Exercises Heel Slides: AAROM;Seated;Left;15 reps Hip ABduction/ADduction: AROM;Left;15 reps;Seated Long Arc Quad: AAROM;Seated;Left;15 reps Marching in Standing: AAROM;Seated;Left;15 reps    General Comments        Pertinent Vitals/Pain Pain Score: 5  Pain Location: left knee Pain Descriptors / Indicators: Aching Pain Intervention(s): Limited activity within patient's tolerance;Monitored during session;RN gave pain meds during session;Repositioned    Home Living                      Prior Function            PT Goals (current goals can now be found in the care plan section) Progress towards PT goals: Progressing toward goals    Frequency       PT Plan Current plan remains appropriate    Co-evaluation             End of Session Equipment Utilized During Treatment: Left knee immobilizer Activity Tolerance: Patient tolerated treatment well Patient left: in chair;with call bell/phone within reach;with chair alarm set     Time: [id number] PT Time Calculation (min) (ACUTE ONLY): 24 min  Charges:  $Gait Training: 8-22 mins $Therapeutic Exercise:  8-22 mins                    G Codes:      Melford Aase 08/03/15, 9:05 AM Elwyn Reach, [place]

## 2015-07-15 NOTE — Care Management Important Message (Signed)
Important Message  Patient Details  Name: [name] Elliott MRN: [id number] Date of Birth: 18-Jul-1928   Medicare Important Message Given:  Yes    Rainn Bullinger P Jaquelin Meaney 07/15/2015, 2:32 PM

## 2015-07-18 ENCOUNTER — Non-Acute Institutional Stay (SKILLED_NURSING_FACILITY): Payer: PPO | Admitting: Internal Medicine

## 2015-07-18 DIAGNOSIS — I5042 Chronic combined systolic (congestive) and diastolic (congestive) heart failure: Secondary | ICD-10-CM

## 2015-07-18 DIAGNOSIS — N183 Chronic kidney disease, stage 3 unspecified: Secondary | ICD-10-CM

## 2015-07-18 DIAGNOSIS — K5909 Other constipation: Secondary | ICD-10-CM

## 2015-07-18 DIAGNOSIS — M1712 Unilateral primary osteoarthritis, left knee: Secondary | ICD-10-CM | POA: Diagnosis not present

## 2015-07-18 DIAGNOSIS — R2681 Unsteadiness on feet: Secondary | ICD-10-CM | POA: Diagnosis not present

## 2015-07-18 DIAGNOSIS — D62 Acute posthemorrhagic anemia: Secondary | ICD-10-CM | POA: Diagnosis not present

## 2015-07-18 DIAGNOSIS — I10 Essential (primary) hypertension: Secondary | ICD-10-CM

## 2015-07-18 DIAGNOSIS — M792 Neuralgia and neuritis, unspecified: Secondary | ICD-10-CM

## 2015-07-18 NOTE — Progress Notes (Signed)
Patient ID: [name] Elliott, male   DOB: [birth year], 81 y.o.   MRN: [id number]     Facility: [facility]    PCP: Lupita Dawn, MD  Code Status: full code  Allergies  Allergen Reactions  . Crestor [Rosuvastatin Calcium] Other (See Comments)    Muscle pain/aches    Chief Complaint  Patient presents with  . New Admit To SNF     HPI:  81 y.o. patient is here for short term rehabilitation post hospital admission from 07/12/15-07/15/15 with left knee OA. He underwent left total knee arthroplasty. He had acute renal failure and hyperkalemia which responded well to iv fluids. He is seen in his room today. His pain is under control with current regimen. He has been having increased sleepiness per staff. He had bowel movement this am  Review of Systems:  Constitutional: Negative for fever, chills, diaphoresis.  HENT: Negative for headache, congestion, nasal discharge Eyes: Negative for eye pain, blurred vision, double vision and discharge.  Respiratory: Negative for cough, shortness of breath and wheezing.   Cardiovascular: Negative for chest pain, palpitations, leg swelling.  Gastrointestinal: Negative for heartburn, nausea, vomiting, abdominal pain. Genitourinary: Negative for dysuria  Musculoskeletal: Negative for back pain, fall in the facility Skin: Negative for itching, rash.  Neurological: Negative for dizziness, tingling, focal weakness Psychiatric/Behavioral: Negative for depression  Past Medical History  Diagnosis Date  . Constipation, chronic   . Sebaceous cyst   . Other malaise and fatigue   . Pain in joint, shoulder region     Has chronic shoulder pain  . Persistent disorder of initiating or maintaining sleep   . Thrombocytopenia, unspecified ([place])   . Vascular dementia without behavioral disturbance     with periods of amnesia  . Other B-complex deficiencies   . Pain in limb   . Esophageal reflux   . Unspecified essential hypertension    . Arthropathy, unspecified, site unspecified   . Pure hypercholesterolemia   . Orthostasis   . PVC's (premature ventricular contractions)   . S/P cardiac cath 08/08/11    mild to moderate CAD primarily in the LAD. None are obstructive and appear stable from prior cath in 2007; managed medically  . Arthritis   . CAD (coronary artery disease)     last cath in 2012. Managed medically-some blockages  . Headache(784.0)   . 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 MRI Nov '11 - tendonosis and partial tear. Left shoulder surgery Jan '12 for partial shoulder replacement. [name])   . Pneumonia 1980's?; 2011  . History of stomach ulcers 11/2012  . Chronic lower back pain   . Atrial fibrillation ([place])   . DVT (deep venous thrombosis) ([place])   . Stroke ([place])   . Dysrhythmia   . Hypothyroidism     pt states he doeen't have   . Diabetes mellitus without complication ([place])     pt states he doesn't have  . Blood dyscrasia     thromboctopenia pt family states he was never told of this  . Primary localized osteoarthritis of left knee 07/12/2015   Past Surgical History  Procedure Laterality Date  . Hand surgery Right     crush injury, right fifth digit contracture, limited  motion  . Lumbar spine surgery  2007    Dr Philip Aspen ([place])  . Knee surgery Left 1991    "did it twice in 1 wk" (02/17/2013)  . Cataract extraction Left 2009  . Shoulder open  rotator cuff repair Left 8.30.2011  . US echocardiography  02-28-2010    Est EF 50-55%  . Cardiac catheterization  08/2011  . Shoulder hemi-arthroplasty    . Finger amputation Right     pinky finger  . Hardware removal  02/05/2012    Procedure: HARDWARE REMOVAL;  Surgeon: Johnny Bridge, MD;  Location: [place];  Service: Orthopedics;  Laterality: Left;  . Reverse shoulder arthroplasty  02/05/2012    Procedure: REVERSE SHOULDER ARTHROPLASTY;  Surgeon: Johnny Bridge, MD;  Location: [place];  Service: Orthopedics;   Laterality: Left;  . Back surgery    . Esophagogastroduodenoscopy N/A 11/19/2012    Procedure: ESOPHAGOGASTRODUODENOSCOPY (EGD);  Surgeon: Jerene Bears, MD;  Location: [place];  Service: Gastroenterology;  Laterality: N/A;  . Colonoscopy    . Total knee arthroplasty Left 07/12/2015    Procedure: TOTAL KNEE ARTHROPLASTY;  Surgeon: Marchia Bond, MD;  Location: [place];  Service: Orthopedics;  Laterality: Left;   Social History:   reports that he quit smoking about 36 years ago. His smoking use included Cigars. His smokeless tobacco use includes Chew. He reports that he does not drink alcohol or use illicit drugs.  Family History  Problem Relation Age of Onset  . Breast cancer Mother   . Cancer Mother   . Diabetes Mother   . Cancer Brother     throat  . Stroke Brother   . Diabetes Daughter   . Colon cancer Neg Hx   . Heart attack Neg Hx   . Kidney disease Father   . Diabetes Sister     Medications:   Medication List       This list is accurate as of: 07/18/15 11:53 AM.  Always use your most recent med list.               aspirin EC 81 MG tablet  Take 81 mg by mouth daily.     baclofen 10 MG tablet  Commonly known as:  LIORESAL  Take 1 tablet (10 mg total) by mouth 3 (three) times daily. As needed for muscle spasm     furosemide 40 MG tablet  Commonly known as:  LASIX  Take 40 mg by mouth daily.     gabapentin 300 MG capsule  Commonly known as:  NEURONTIN  Take 2 capsules (600 mg total) by mouth daily.     isosorbide mononitrate 60 MG 24 hr tablet  Commonly known as:  IMDUR  Take 1 tablet (60 mg total) by mouth every morning.     omeprazole 20 MG capsule  Commonly known as:  PRILOSEC  Take 1 capsule by mouth twice a day before a meal     ondansetron 4 MG tablet  Commonly known as:  ZOFRAN  Take 1 tablet (4 mg total) by mouth every 8 (eight) hours as needed for nausea or vomiting.     oxyCODONE-acetaminophen 5-325 MG tablet  Commonly known as:  ROXICET    Take 1-2 tablets by mouth every 6 (six) hours as needed for severe pain.     potassium chloride SA 20 MEQ tablet  Commonly known as:  K-DUR,KLOR-CON  Take 1 tablet (20 mEq total) by mouth 2 (two) times daily.     psyllium 58.6 % powder  Commonly known as:  METAMUCIL  Take 1 packet by mouth daily as needed (constipation).     Rivaroxaban 15 MG Tabs tablet  Commonly known as:  XARELTO  Take 1 tablet (15 mg total)  by mouth daily.     sennosides-docusate sodium 8.6-50 MG tablet  Commonly known as:  SENOKOT-S  Take 2 tablets by mouth daily.         Physical Exam: Filed Vitals:   07/18/15 1151  BP: 124/68  Pulse: 78  Temp: 98.9 F (37.2 C)  Resp: 18  SpO2: 96%    General- elderly male, in no acute distress Head- normocephalic, atraumatic Nose- no maxillary or frontal sinus tenderness, no nasal discharge Throat- moist mucus membrane Eyes- PERRLA, EOMI, no pallor, no icterus, no discharge, normal conjunctiva, normal sclera Neck- no cervical lymphadenopathy Cardiovascular- normal s1,s2, no murmurs, trace left leg edema Respiratory- bilateral clear to auscultation, no wheeze, no rhonchi, no crackles, no use of accessory muscles Abdomen- bowel sounds present, soft, non tender Musculoskeletal- able to move all 4 extremities, limited left knee range of motion  Neurological- no focal deficit, alert and oriented to person, place and time Skin- warm and dry, steristrip to left knee surgical incision, easy bruising Psychiatry- normal mood and affect    Labs reviewed: Basic Metabolic Panel:  Recent Labs  07/27/14 1312  07/13/15 0515 07/14/15 0753 07/15/15 0449  NA 136  < > 132* 131* 136  K 4.3  < > 5.1 4.5 4.8  CL 96  < > 100* 100* 104  CO2 27  < > 25 24 24   GLUCOSE 94  < > 127* 148* 126*  BUN 26*  < > 18 21* 21*  CREATININE 1.3  < > 1.94* 1.74* 1.47*  CALCIUM 9.5  < > 8.4* 8.5* 8.4*  PHOS 3.6  --   --   --   --   < > = values in this interval not displayed. Liver  Function Tests:  Recent Labs  07/27/14 1312 08/01/14 1945  AST  --  18  ALT  --  10  ALKPHOS  --  72  BILITOT  --  0.5  PROT  --  7.2  ALBUMIN 4.2 3.7   No results for input(s): LIPASE, AMYLASE in the last 8760 hours. No results for input(s): AMMONIA in the last 8760 hours. CBC:  Recent Labs  12/12/14 1400 06/18/15 1529 06/28/15 1535  07/13/15 0515 07/14/15 0753 07/15/15 0449  WBC 6.5 9.0 10.5  < > 6.9 7.2 7.9  NEUTROABS 3.4 6.6 7.0  --   --   --   --   HGB 13.2 12.9* 13.0  < > 10.1* 9.4* 8.3*  HCT 39.5 38.3* 39.1  < > 31.4* 29.0* 25.4*  MCV 86.1 84.7 85.0  < > 86.7 85.8 84.9  PLT 241 220 384  < > 188 158 183  < > = values in this interval not displayed. Cardiac Enzymes: No results for input(s): CKTOTAL, CKMB, CKMBINDEX, TROPONINI in the last 8760 hours. BNP: Invalid input(s): POCBNP CBG:  Recent Labs  08/01/14 2332 08/03/14 0705 07/12/15 0946  GLUCAP 129* 107* 125*     Assessment/Plan  Unsteady gait Will have patient work with PT/OT as tolerated to regain strength and restore function.  Fall precautions are in place.  Left knee OA S/p left TKA. WBAT. Has f/u with orthopedics. Continue baclofen 10 mg tid prn muscle spasm. Continue roxicet 5-325 mg 1-2 tab q6h prn pain. Continue xarelto for dvt prophylaxis. Will have him work with physical therapy and occupational therapy team to help with gait training and muscle strengthening exercises.fall precautions. Skin care. Encourage to be out of bed. Add ted hose to help with left leg trace  edema  Neuropathic pain Change gabapentin to 600 mg daily from 600 mg tid for now with increased sleepiness.   CHF Stable, continue lasix 40 mg daily, imdur 60 mg daily, kcl supplement. Check bmp. Monitor weight  HTN Stable bp, monitor bp reading, continue imdur, lasix  Constipation Continue senokot s 2 tab daily with prn metamucil  Blood loss anemia Post op, monitor cbc  ckd stage 3 Monitor renal function  Goals of  care: short term rehabilitation   Labs/tests ordered: cbc, cmp 07/19/15  Family/ staff Communication: reviewed care plan with patient and nursing supervisor    Blanchie Serve, MD  [facility] [phone] (Monday-Friday 8 am - 5 pm) [phone] (afterhours)

## 2015-07-22 ENCOUNTER — Other Ambulatory Visit: Payer: Self-pay | Admitting: *Deleted

## 2015-07-22 MED ORDER — OXYCODONE-ACETAMINOPHEN 5-325 MG PO TABS: 1.0000 | ORAL_TABLET | ORAL | Status: DC | PRN

## 2015-07-25 LAB — CBC AND DIFFERENTIAL
HCT: 31 % — AB (ref 41–53)
HEMOGLOBIN: 9.8 g/dL — AB (ref 13.5–17.5)
Platelets: 515 10*3/uL — AB (ref 150–399)
WBC: 9.3 10^3/mL

## 2015-08-01 ENCOUNTER — Non-Acute Institutional Stay (SKILLED_NURSING_FACILITY): Payer: PPO | Admitting: Nurse Practitioner

## 2015-08-01 DIAGNOSIS — K219 Gastro-esophageal reflux disease without esophagitis: Secondary | ICD-10-CM

## 2015-08-01 DIAGNOSIS — I824Y1 Acute embolism and thrombosis of unspecified deep veins of right proximal lower extremity: Secondary | ICD-10-CM | POA: Diagnosis not present

## 2015-08-01 DIAGNOSIS — K5909 Other constipation: Secondary | ICD-10-CM | POA: Diagnosis not present

## 2015-08-01 DIAGNOSIS — I5042 Chronic combined systolic (congestive) and diastolic (congestive) heart failure: Secondary | ICD-10-CM | POA: Diagnosis not present

## 2015-08-01 DIAGNOSIS — I10 Essential (primary) hypertension: Secondary | ICD-10-CM

## 2015-08-01 DIAGNOSIS — M1712 Unilateral primary osteoarthritis, left knee: Secondary | ICD-10-CM

## 2015-08-01 NOTE — Progress Notes (Signed)
Patient ID: [name] Elliott, male   DOB: April 12, 1928, 81 y.o.   MRN: [id number]    Nursing Home Location:  [place] of Service: SNF (31)  PCP: Lupita Dawn, MD  Allergies  Allergen Reactions  . Crestor [Rosuvastatin Calcium] Other (See Comments)    Muscle pain/aches    Chief Complaint  Patient presents with  . Discharge Note    HPI:  Patient is a 82 y.o. male seen today at [facility] for discharge home.pt is here for short term rehabilitation post hospital admission from 07/12/15-07/15/15 with left knee OA. He underwent left total knee arthroplasty. During hospitalization he had acute renal failure and hyperkalemia which resolved with IV fluids. Patient currently doing well with therapy, now stable to discharge home with home health.  Review of Systems:  Review of Systems  Constitutional: Negative for activity change, appetite change, fatigue and unexpected weight change.  HENT: Negative for congestion and hearing loss.   Eyes: Negative.   Respiratory: Negative for cough and shortness of breath.   Cardiovascular: Negative for chest pain, palpitations and leg swelling.  Gastrointestinal: Negative for abdominal pain, diarrhea and constipation.  Genitourinary: Negative for dysuria and difficulty urinating.  Musculoskeletal: Negative for myalgias and arthralgias.  Skin: Negative for color change and wound.  Neurological: Negative for dizziness and weakness.  Psychiatric/Behavioral: Negative for behavioral problems, confusion and agitation.    Past Medical History  Diagnosis Date  . Constipation, chronic   . Sebaceous cyst   . Other malaise and fatigue   . Pain in joint, shoulder region     Has chronic shoulder pain  . Persistent disorder of initiating or maintaining sleep   . Thrombocytopenia, unspecified ([place])   . Vascular dementia without behavioral disturbance     with periods of amnesia  . Other B-complex deficiencies     . Pain in limb   . Esophageal reflux   . Unspecified essential hypertension   . Arthropathy, unspecified, site unspecified   . Pure hypercholesterolemia   . Orthostasis   . PVC's (premature ventricular contractions)   . S/P cardiac cath 08/08/11    mild to moderate CAD primarily in the LAD. None are obstructive and appear stable from prior cath in 2007; managed medically  . Arthritis   . CAD (coronary artery disease)     last cath in 2012. Managed medically-some blockages  . Headache(784.0)   . 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 MRI Nov '11 - tendonosis and partial tear. Left shoulder surgery Jan '12 for partial shoulder replacement. [name])   . Pneumonia 1980's?; 2011  . History of stomach ulcers 11/2012  . Chronic lower back pain   . Atrial fibrillation ([place])   . DVT (deep venous thrombosis) ([place])   . Stroke ([place])   . Dysrhythmia   . Hypothyroidism     pt states he doeen't have   . Diabetes mellitus without complication ([place])     pt states he doesn't have  . Blood dyscrasia     thromboctopenia pt family states he was never told of this  . Primary localized osteoarthritis of left knee 07/12/2015   Past Surgical History  Procedure Laterality Date  . Hand surgery Right     crush injury, right fifth digit contracture, limited  motion  . Lumbar spine surgery  2007    Dr Philip Aspen ([place])  . Knee surgery Left 1991    "did it twice  in 1 wk" (02/17/2013)  . Cataract extraction Left 2009  . Shoulder open rotator cuff repair Left 8.30.2011  . US echocardiography  02-28-2010    Est EF 50-55%  . Cardiac catheterization  08/2011  . Shoulder hemi-arthroplasty    . Finger amputation Right     pinky finger  . Hardware removal  02/05/2012    Procedure: HARDWARE REMOVAL;  Surgeon: Johnny Bridge, MD;  Location: [place];  Service: Orthopedics;  Laterality: Left;  . Reverse shoulder arthroplasty  02/05/2012    Procedure: REVERSE SHOULDER ARTHROPLASTY;   Surgeon: Johnny Bridge, MD;  Location: [place];  Service: Orthopedics;  Laterality: Left;  . Back surgery    . Esophagogastroduodenoscopy N/A 11/19/2012    Procedure: ESOPHAGOGASTRODUODENOSCOPY (EGD);  Surgeon: Jerene Bears, MD;  Location: [place];  Service: Gastroenterology;  Laterality: N/A;  . Colonoscopy    . Total knee arthroplasty Left 07/12/2015    Procedure: TOTAL KNEE ARTHROPLASTY;  Surgeon: Marchia Bond, MD;  Location: [place];  Service: Orthopedics;  Laterality: Left;   Social History:   reports that he quit smoking about 36 years ago. His smoking use included Cigars. His smokeless tobacco use includes Chew. He reports that he does not drink alcohol or use illicit drugs.  Family History  Problem Relation Age of Onset  . Breast cancer Mother   . Cancer Mother   . Diabetes Mother   . Cancer Brother     throat  . Stroke Brother   . Diabetes Daughter   . Colon cancer Neg Hx   . Heart attack Neg Hx   . Kidney disease Father   . Diabetes Sister     Medications: Patient's Medications  New Prescriptions   No medications on file  Previous Medications   ASPIRIN EC 81 MG TABLET    Take 81 mg by mouth daily.    BACLOFEN (LIORESAL) 10 MG TABLET    Take 1 tablet (10 mg total) by mouth 3 (three) times daily. As needed for muscle spasm   FUROSEMIDE (LASIX) 40 MG TABLET    Take 40 mg by mouth daily.    GABAPENTIN (NEURONTIN) 300 MG CAPSULE    Take 2 capsules (600 mg total) by mouth daily.   ISOSORBIDE MONONITRATE (IMDUR) 60 MG 24 HR TABLET    Take 1 tablet (60 mg total) by mouth every morning.   OMEPRAZOLE (PRILOSEC) 20 MG CAPSULE    Take 1 capsule by mouth twice a day before a meal   ONDANSETRON (ZOFRAN) 4 MG TABLET    Take 1 tablet (4 mg total) by mouth every 8 (eight) hours as needed for nausea or vomiting.   OXYCODONE-ACETAMINOPHEN (ROXICET) 5-325 MG TABLET    Take 1-2 tablets by mouth every 4 (four) hours as needed for severe pain.   POTASSIUM CHLORIDE SA (K-DUR,KLOR-CON) 20  MEQ TABLET    Take 1 tablet (20 mEq total) by mouth 2 (two) times daily.   PSYLLIUM (METAMUCIL) 58.6 % POWDER    Take 1 packet by mouth daily as needed (constipation).   RIVAROXABAN (XARELTO) 15 MG TABS TABLET    Take 1 tablet (15 mg total) by mouth daily.   SENNOSIDES-DOCUSATE SODIUM (SENOKOT-S) 8.6-50 MG TABLET    Take 2 tablets by mouth daily.  Modified Medications   No medications on file  Discontinued Medications   No medications on file     Physical Exam: Filed Vitals:   08/01/15 1054  BP: 127/51  Pulse: 78  Temp:  98.1 F (36.7 C)  Resp: 18    Physical Exam  Constitutional: He is oriented to person, place, and time. He appears well-developed and well-nourished. No distress.  HENT:  Head: Normocephalic and atraumatic.  Mouth/Throat: Oropharynx is clear and moist. No oropharyngeal exudate.  Eyes: Conjunctivae and EOM are normal. Pupils are equal, round, and reactive to light.  Neck: Normal range of motion. Neck supple.  Cardiovascular: Normal rate, regular rhythm and normal heart sounds.   Pulmonary/Chest: Effort normal and breath sounds normal.  Abdominal: Soft. Bowel sounds are normal.  Musculoskeletal: He exhibits no edema or tenderness.  Neurological: He is alert and oriented to person, place, and time.  Skin: Skin is warm and dry. He is not diaphoretic.  Well healing left knee surgical incision   Psychiatric: He has a normal mood and affect.    Labs reviewed: Basic Metabolic Panel:  Recent Labs  07/13/15 0515 07/14/15 0753 07/15/15 0449  NA 132* 131* 136  K 5.1 4.5 4.8  CL 100* 100* 104  CO2 25 24 24   GLUCOSE 127* 148* 126*  BUN 18 21* 21*  CREATININE 1.94* 1.74* 1.47*  CALCIUM 8.4* 8.5* 8.4*   Liver Function Tests:  Recent Labs  08/01/14 1945  AST 18  ALT 10  ALKPHOS 72  BILITOT 0.5  PROT 7.2  ALBUMIN 3.7   No results for input(s): LIPASE, AMYLASE in the last 8760 hours. No results for input(s): AMMONIA in the last 8760  hours. CBC:  Recent Labs  12/12/14 1400 06/18/15 1529 06/28/15 1535  07/13/15 0515 07/14/15 0753 07/15/15 0449 07/25/15  WBC 6.5 9.0 10.5  < > 6.9 7.2 7.9 9.3  NEUTROABS 3.4 6.6 7.0  --   --   --   --   --   HGB 13.2 12.9* 13.0  < > 10.1* 9.4* 8.3* 9.8*  HCT 39.5 38.3* 39.1  < > 31.4* 29.0* 25.4* 31*  MCV 86.1 84.7 85.0  < > 86.7 85.8 84.9  --   PLT 241 220 384  < > 188 158 183 515*  < > = values in this interval not displayed. TSH: No results for input(s): TSH in the last 8760 hours. A1C: Lab Results  Component Value Date   HGBA1C 7.5 06/28/2015   Lipid Panel:  Recent Labs  08/02/14 0334  CHOL 155  HDL 59  LDLCALC 87  TRIG 46  CHOLHDL 2.6   Assessment/Plan 1. Primary osteoarthritis of left knee S/p total knee arthoplasty, pain controlled with current regimen, to cont baclofen 10 mg TID as needed and percocet as needed. doing well with therapy. conts on xarelto for DVT prophylaxis Follow up with ortho   2. CONSTIPATION, CHRONIC Stable on current regimen, to cont at this time  3. Gastroesophageal reflux disease, esophagitis presence not specified Stable, conts on omeprazole daily   4. Essential hypertension Blood pressure remains stable, cont on lasix and imdur.    5. Deep vein thrombosis (DVT) of proximal vein of right lower extremity, unspecified chronicity (HCC) Stable, to  remain on xarelto due to DVT  6. Chronic combined systolic and diastolic congestive heart failure (HCC) Evuolemic, conts on lasix and potassium supplement    pt is stable for discharge-will need PT/OT/per home health. No DME needed. Rx written.  will need to follow up with PCP within 2 weeks.    Carlos American. Harle Battiest  [facility] [phone] 8 am - 5 pm) [phone] (after hours)

## 2015-08-05 DIAGNOSIS — I10 Essential (primary) hypertension: Secondary | ICD-10-CM | POA: Diagnosis not present

## 2015-08-05 DIAGNOSIS — Z96652 Presence of left artificial knee joint: Secondary | ICD-10-CM | POA: Diagnosis not present

## 2015-08-05 DIAGNOSIS — K219 Gastro-esophageal reflux disease without esophagitis: Secondary | ICD-10-CM | POA: Diagnosis not present

## 2015-08-05 DIAGNOSIS — Z471 Aftercare following joint replacement surgery: Secondary | ICD-10-CM | POA: Diagnosis not present

## 2015-09-20 ENCOUNTER — Other Ambulatory Visit: Payer: Self-pay | Admitting: Internal Medicine

## 2015-09-22 ENCOUNTER — Other Ambulatory Visit: Payer: Self-pay | Admitting: *Deleted

## 2015-09-22 NOTE — Telephone Encounter (Signed)
Faxed refill request from [facility] requesting 3 month supply (#[phone]).  Last Filled:    30 capsule 0 07/15/2015  Please advise.

## 2015-09-22 NOTE — Telephone Encounter (Signed)
RN staff - please call patient and verify how often he is taking Gabapentin and what dose. I received a refill request for 600 mg daily (this is typically a TID drug), thanks.

## 2015-09-24 ENCOUNTER — Encounter (HOSPITAL_COMMUNITY): Payer: Self-pay | Admitting: *Deleted

## 2015-09-24 ENCOUNTER — Emergency Department (HOSPITAL_COMMUNITY)
Admission: EM | Admit: 2015-09-24 | Discharge: 2015-09-25 | Disposition: A | Discharge status: Home / Self Care | Payer: PPO | Attending: Emergency Medicine | Admitting: Emergency Medicine

## 2015-09-24 ENCOUNTER — Emergency Department (HOSPITAL_COMMUNITY): Payer: PPO

## 2015-09-24 ENCOUNTER — Other Ambulatory Visit: Payer: Self-pay

## 2015-09-24 DIAGNOSIS — G8929 Other chronic pain: Secondary | ICD-10-CM | POA: Diagnosis not present

## 2015-09-24 DIAGNOSIS — Z9889 Other specified postprocedural states: Secondary | ICD-10-CM | POA: Insufficient documentation

## 2015-09-24 DIAGNOSIS — Z8701 Personal history of pneumonia (recurrent): Secondary | ICD-10-CM | POA: Insufficient documentation

## 2015-09-24 DIAGNOSIS — Z7982 Long term (current) use of aspirin: Secondary | ICD-10-CM | POA: Diagnosis not present

## 2015-09-24 DIAGNOSIS — R079 Chest pain, unspecified: Secondary | ICD-10-CM | POA: Diagnosis not present

## 2015-09-24 DIAGNOSIS — F0150 Vascular dementia without behavioral disturbance: Secondary | ICD-10-CM | POA: Diagnosis not present

## 2015-09-24 DIAGNOSIS — Z86718 Personal history of other venous thrombosis and embolism: Secondary | ICD-10-CM | POA: Insufficient documentation

## 2015-09-24 DIAGNOSIS — R350 Frequency of micturition: Secondary | ICD-10-CM | POA: Diagnosis not present

## 2015-09-24 DIAGNOSIS — I2510 Atherosclerotic heart disease of native coronary artery without angina pectoris: Secondary | ICD-10-CM | POA: Insufficient documentation

## 2015-09-24 DIAGNOSIS — K5900 Constipation, unspecified: Secondary | ICD-10-CM | POA: Insufficient documentation

## 2015-09-24 DIAGNOSIS — Z862 Personal history of diseases of the blood and blood-forming organs and certain disorders involving the immune mechanism: Secondary | ICD-10-CM | POA: Diagnosis not present

## 2015-09-24 DIAGNOSIS — E119 Type 2 diabetes mellitus without complications: Secondary | ICD-10-CM | POA: Insufficient documentation

## 2015-09-24 DIAGNOSIS — Z8673 Personal history of transient ischemic attack (TIA), and cerebral infarction without residual deficits: Secondary | ICD-10-CM | POA: Diagnosis not present

## 2015-09-24 DIAGNOSIS — R1030 Lower abdominal pain, unspecified: Secondary | ICD-10-CM | POA: Insufficient documentation

## 2015-09-24 DIAGNOSIS — M1990 Unspecified osteoarthritis, unspecified site: Secondary | ICD-10-CM | POA: Insufficient documentation

## 2015-09-24 DIAGNOSIS — I10 Essential (primary) hypertension: Secondary | ICD-10-CM | POA: Insufficient documentation

## 2015-09-24 DIAGNOSIS — Z79899 Other long term (current) drug therapy: Secondary | ICD-10-CM | POA: Diagnosis not present

## 2015-09-24 DIAGNOSIS — Z87891 Personal history of nicotine dependence: Secondary | ICD-10-CM | POA: Diagnosis not present

## 2015-09-24 DIAGNOSIS — Z872 Personal history of diseases of the skin and subcutaneous tissue: Secondary | ICD-10-CM | POA: Diagnosis not present

## 2015-09-24 DIAGNOSIS — R351 Nocturia: Secondary | ICD-10-CM | POA: Insufficient documentation

## 2015-09-24 DIAGNOSIS — Z7901 Long term (current) use of anticoagulants: Secondary | ICD-10-CM | POA: Insufficient documentation

## 2015-09-24 DIAGNOSIS — K219 Gastro-esophageal reflux disease without esophagitis: Secondary | ICD-10-CM | POA: Diagnosis not present

## 2015-09-24 LAB — CBC WITH DIFFERENTIAL/PLATELET
Basophils Absolute: 0 10*3/uL (ref 0.0–0.1)
Basophils Relative: 0 %
EOS ABS: 0 10*3/uL (ref 0.0–0.7)
EOS PCT: 0 %
HCT: 37.2 % — ABNORMAL LOW (ref 39.0–52.0)
Hemoglobin: 11.9 g/dL — ABNORMAL LOW (ref 13.0–17.0)
LYMPHS ABS: 1.8 10*3/uL (ref 0.7–4.0)
Lymphocytes Relative: 20 %
MCH: 26.3 pg (ref 26.0–34.0)
MCHC: 32 g/dL (ref 30.0–36.0)
MCV: 82.3 fL (ref 78.0–100.0)
MONOS PCT: 9 %
Monocytes Absolute: 0.8 10*3/uL (ref 0.1–1.0)
Neutro Abs: 6.4 10*3/uL (ref 1.7–7.7)
Neutrophils Relative %: 71 %
Platelets: 273 10*3/uL (ref 150–400)
RBC: 4.52 MIL/uL (ref 4.22–5.81)
RDW: 14.6 % (ref 11.5–15.5)
WBC: 9.1 10*3/uL (ref 4.0–10.5)

## 2015-09-24 LAB — COMPREHENSIVE METABOLIC PANEL
ALK PHOS: 52 U/L (ref 38–126)
ALT: 8 U/L — AB (ref 17–63)
AST: 17 U/L (ref 15–41)
Albumin: 3.5 g/dL (ref 3.5–5.0)
Anion gap: 12 (ref 5–15)
BUN: 20 mg/dL (ref 6–20)
CALCIUM: 9.2 mg/dL (ref 8.9–10.3)
CO2: 22 mmol/L (ref 22–32)
CREATININE: 1.48 mg/dL — AB (ref 0.61–1.24)
Chloride: 101 mmol/L (ref 101–111)
GFR, EST AFRICAN AMERICAN: 48 mL/min — AB (ref >60)
GFR, EST NON AFRICAN AMERICAN: 41 mL/min — AB (ref >60)
Glucose, Bld: 131 mg/dL — ABNORMAL HIGH (ref 65–99)
Potassium: 4.4 mmol/L (ref 3.5–5.1)
Sodium: 135 mmol/L (ref 135–145)
TOTAL PROTEIN: 7.5 g/dL (ref 6.5–8.1)
Total Bilirubin: 0.9 mg/dL (ref 0.3–1.2)

## 2015-09-24 LAB — LIPASE, BLOOD: LIPASE: 24 U/L (ref 11–51)

## 2015-09-24 LAB — I-STAT CG4 LACTIC ACID, ED: LACTIC ACID, VENOUS: 1.72 mmol/L (ref 0.5–2.0)

## 2015-09-24 LAB — TROPONIN I

## 2015-09-24 MED ORDER — IOHEXOL 300 MG/ML  SOLN
80.0000 mL | Freq: Once | INTRAMUSCULAR | Status: AC | PRN
  Administered 2015-09-24: 100 mL via INTRAVENOUS

## 2015-09-24 NOTE — ED Provider Notes (Addendum)
CSN: [id number]     Arrival date & time 09/24/15  1935 History   First MD Initiated Contact with Patient 09/24/15 2125     Chief Complaint  Patient presents with  . multiple complaints      (Consider location/radiation/quality/duration/timing/severity/associated sxs/prior Treatment) The history is provided by the patient and a relative. The history is limited by the condition of the patient.  Patient c/o pain to lower abdomen in the past week. Pain dull, constant, persistent. No specific exacerbating or alleviating factors. Denies hx same pain. Pt very limited/difficult historian. No vomiting or diarrhea. No dysuria or hematuria, but says was up urinating several times last night. +urinary urgency/freq. No dysuria  No fevers. Denies trauma or fall. Pt had also c/o pain to left foot earlier, denies injury.       Past Medical History  Diagnosis Date  . Constipation, chronic   . Sebaceous cyst   . Other malaise and fatigue   . Pain in joint, shoulder region     Has chronic shoulder pain  . Persistent disorder of initiating or maintaining sleep   . Thrombocytopenia, unspecified ([place])   . Vascular dementia without behavioral disturbance     with periods of amnesia  . Other B-complex deficiencies   . Pain in limb   . Esophageal reflux   . Unspecified essential hypertension   . Arthropathy, unspecified, site unspecified   . Pure hypercholesterolemia   . Orthostasis   . PVC's (premature ventricular contractions)   . S/P cardiac cath 08/08/11    mild to moderate CAD primarily in the LAD. None are obstructive and appear stable from prior cath in 2007; managed medically  . Arthritis   . CAD (coronary artery disease)     last cath in 2012. Managed medically-some blockages  . Headache(784.0)   . 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 MRI Nov '11 - tendonosis and partial tear. Left shoulder surgery Jan '12 for partial shoulder replacement. [name])   .  Pneumonia 1980's?; 2011  . History of stomach ulcers 11/2012  . Chronic lower back pain   . Atrial fibrillation ([place])   . DVT (deep venous thrombosis) ([place])   . Stroke ([place])   . Dysrhythmia   . Hypothyroidism     pt states he doeen't have   . Diabetes mellitus without complication ([place])     pt states he doesn't have  . Blood dyscrasia     thromboctopenia pt family states he was never told of this  . Primary localized osteoarthritis of left knee 07/12/2015   Past Surgical History  Procedure Laterality Date  . Hand surgery Right     crush injury, right fifth digit contracture, limited  motion  . Lumbar spine surgery  2007    Dr Philip Aspen ([place])  . Knee surgery Left 1991    "did it twice in 1 wk" (02/17/2013)  . Cataract extraction Left 2009  . Shoulder open rotator cuff repair Left 8.30.2011  . US echocardiography  02-28-2010    Est EF 50-55%  . Cardiac catheterization  08/2011  . Shoulder hemi-arthroplasty    . Finger amputation Right     pinky finger  . Hardware removal  02/05/2012    Procedure: HARDWARE REMOVAL;  Surgeon: Johnny Bridge, MD;  Location: [facility];  Service: Orthopedics;  Laterality: Left;  . Reverse shoulder arthroplasty  02/05/2012    Procedure: REVERSE SHOULDER ARTHROPLASTY;  Surgeon: Johnny Bridge, MD;  Location: [place];  Service: Orthopedics;  Laterality: Left;  . Back surgery    . Esophagogastroduodenoscopy N/A 11/19/2012    Procedure: ESOPHAGOGASTRODUODENOSCOPY (EGD);  Surgeon: Jerene Bears, MD;  Location: [place];  Service: Gastroenterology;  Laterality: N/A;  . Colonoscopy    . Total knee arthroplasty Left 07/12/2015    Procedure: TOTAL KNEE ARTHROPLASTY;  Surgeon: Marchia Bond, MD;  Location: [place];  Service: Orthopedics;  Laterality: Left;   Family History  Problem Relation Age of Onset  . Breast cancer Mother   . Cancer Mother   . Diabetes Mother   . Cancer Brother     throat  . Stroke Brother   . Diabetes Daughter   . Colon cancer Neg  Hx   . Heart attack Neg Hx   . Kidney disease Father   . Diabetes Sister    Social History  Substance Use Topics  . Smoking status: Former Smoker    Types: Cigars    Quit date: 09/03/1978  . Smokeless tobacco: Current User    Types: Chew     Comment: 02/17/2013 "I aien't smoked a cigar in 20-30 yr"  . Alcohol Use: No    Review of Systems  Constitutional: Negative for fever and chills.  HENT: Negative for sore throat.   Eyes: Negative for redness.  Respiratory: Negative for cough and shortness of breath.   Cardiovascular: Negative for chest pain.  Gastrointestinal: Positive for abdominal pain. Negative for vomiting and diarrhea.  Genitourinary: Positive for frequency. Negative for dysuria and flank pain.  Musculoskeletal: Negative for back pain and neck pain.  Skin: Negative for rash.  Neurological: Negative for headaches.  Hematological: Does not bruise/bleed easily.  Psychiatric/Behavioral: Negative for agitation.      Allergies  Crestor  Home Medications   Prior to Admission medications   Medication Sig Start Date End Date Taking? Authorizing Provider  aspirin EC 81 MG tablet Take 81 mg by mouth daily.     Historical Provider, MD  baclofen (LIORESAL) 10 MG tablet Take 1 tablet (10 mg total) by mouth 3 (three) times daily. As needed for muscle spasm 07/12/15   Marchia Bond, MD  furosemide (LASIX) 40 MG tablet Take 40 mg by mouth daily.     Historical Provider, MD  gabapentin (NEURONTIN) 300 MG capsule Take 2 capsules (600 mg total) by mouth daily. 07/15/15   Marchia Bond, MD  isosorbide mononitrate (IMDUR) 60 MG 24 hr tablet Take 1 tablet (60 mg total) by mouth every morning. 06/13/15   Thayer Headings, MD  omeprazole (PRILOSEC) 20 MG capsule Take 1 capsule by mouth twice a day before a meal 06/13/15   Venia Carbon, MD  ondansetron (ZOFRAN) 4 MG tablet Take 1 tablet (4 mg total) by mouth every 8 (eight) hours as needed for nausea or vomiting. 07/12/15   Marchia Bond, MD  oxyCODONE-acetaminophen (ROXICET) 5-325 MG tablet Take 1-2 tablets by mouth every 4 (four) hours as needed for severe pain. 07/22/15   Tiffany L Reed, DO  potassium chloride SA (K-DUR,KLOR-CON) 20 MEQ tablet Take 1 tablet (20 mEq total) by mouth 2 (two) times daily. 05/03/15   Thayer Headings, MD  psyllium (METAMUCIL) 58.6 % powder Take 1 packet by mouth daily as needed (constipation).    Historical Provider, MD  Rivaroxaban (XARELTO) 15 MG TABS tablet Take 1 tablet (15 mg total) by mouth daily. 07/12/15   Marchia Bond, MD  sennosides-docusate sodium (SENOKOT-S) 8.6-50 MG tablet Take 2 tablets by mouth daily. 07/12/15  Marchia Bond, MD   BP 122/75 mmHg  Pulse 113  Temp(Src) 98.4 F (36.9 C) (Oral)  Resp 18  SpO2 97% Physical Exam  Constitutional: He is oriented to person, place, and time. He appears well-developed and well-nourished. No distress.  HENT:  Head: Atraumatic.  Mouth/Throat: Oropharynx is clear and moist.  Eyes: Conjunctivae are normal. Pupils are equal, round, and reactive to light. No scleral icterus.  Neck: Neck supple. No tracheal deviation present.  No stiffness or rigidity  Cardiovascular: Normal rate, regular rhythm, normal heart sounds and intact distal pulses.  Exam reveals no gallop and no friction rub.   No murmur heard. Pulmonary/Chest: Effort normal and breath sounds normal. No accessory muscle usage. No respiratory distress.  Abdominal: Soft. Bowel sounds are normal. He exhibits no distension and no mass. There is tenderness. There is no rebound and no guarding.  Lower abdominal tenderness.   Genitourinary:  Normal ext genitalia, no scrotal or testicular pain, swelling or tenderness. No cva tenderness  Musculoskeletal: Normal range of motion. He exhibits no edema.  Tenderness along left 1st mt. Distal pulses palp. No infection, cellulitis or abscess to extremities or foot.   Neurological: He is alert and oriented to person, place, and time.  Skin:  Skin is warm and dry. No rash noted.  Psychiatric: He has a normal mood and affect.  Nursing note and vitals reviewed.   ED Course  Procedures (including critical care time) Labs Review  Results for orders placed or performed during the hospital encounter of 09/24/15  CBC with Differential  Result Value Ref Range   WBC 9.1 4.0 - 10.5 K/uL   RBC 4.52 4.22 - 5.81 MIL/uL   Hemoglobin 11.9 (L) 13.0 - 17.0 g/dL   HCT 37.2 (L) 39.0 - 52.0 %   MCV 82.3 78.0 - 100.0 fL   MCH 26.3 26.0 - 34.0 pg   MCHC 32.0 30.0 - 36.0 g/dL   RDW 14.6 11.5 - 15.5 %   Platelets 273 150 - 400 K/uL   Neutrophils Relative % 71 %   Neutro Abs 6.4 1.7 - 7.7 K/uL   Lymphocytes Relative 20 %   Lymphs Abs 1.8 0.7 - 4.0 K/uL   Monocytes Relative 9 %   Monocytes Absolute 0.8 0.1 - 1.0 K/uL   Eosinophils Relative 0 %   Eosinophils Absolute 0.0 0.0 - 0.7 K/uL   Basophils Relative 0 %   Basophils Absolute 0.0 0.0 - 0.1 K/uL  Comprehensive metabolic panel  Result Value Ref Range   Sodium 135 135 - 145 mmol/L   Potassium 4.4 3.5 - 5.1 mmol/L   Chloride 101 101 - 111 mmol/L   CO2 22 22 - 32 mmol/L   Glucose, Bld 131 (H) 65 - 99 mg/dL   BUN 20 6 - 20 mg/dL   Creatinine, Ser 1.48 (H) 0.61 - 1.24 mg/dL   Calcium 9.2 8.9 - 10.3 mg/dL   Total Protein 7.5 6.5 - 8.1 g/dL   Albumin 3.5 3.5 - 5.0 g/dL   AST 17 15 - 41 U/L   ALT 8 (L) 17 - 63 U/L   Alkaline Phosphatase 52 38 - 126 U/L   Total Bilirubin 0.9 0.3 - 1.2 mg/dL   GFR calc non Af Amer 41 (L) >60 mL/min   GFR calc Af Amer 48 (L) >60 mL/min   Anion gap 12 5 - 15  Troponin I  Result Value Ref Range   Troponin I <0.03 <0.031 ng/mL  Urinalysis, Routine w reflex  microscopic (not at [facility])  Result Value Ref Range   Color, Urine YELLOW YELLOW   APPearance CLEAR CLEAR   Specific Gravity, Urine 1.013 1.005 - 1.030   pH 5.0 5.0 - 8.0   Glucose, UA NEGATIVE NEGATIVE mg/dL   Hgb urine dipstick SMALL (A) NEGATIVE   Bilirubin Urine NEGATIVE NEGATIVE   Ketones, ur  15 (A) NEGATIVE mg/dL   Protein, ur NEGATIVE NEGATIVE mg/dL   Nitrite NEGATIVE NEGATIVE   Leukocytes, UA NEGATIVE NEGATIVE  Lipase, blood  Result Value Ref Range   Lipase 24 11 - 51 U/L  Urine microscopic-add on  Result Value Ref Range   Squamous Epithelial / LPF 0-5 (A) NONE SEEN   WBC, UA 0-5 0 - 5 WBC/hpf   RBC / HPF 0-5 0 - 5 RBC/hpf   Bacteria, UA RARE (A) NONE SEEN   Casts HYALINE CASTS (A) NEGATIVE  I-Stat CG4 Lactic Acid, ED  Result Value Ref Range   Lactic Acid, Venous 1.72 0.5 - 2.0 mmol/L  I-Stat CG4 Lactic Acid, ED  Result Value Ref Range   Lactic Acid, Venous 1.84 0.5 - 2.0 mmol/L   Dg Chest 2 View  09/24/2015  CLINICAL DATA:  82 year old male with lower abdominal pain for 1 week. Nausea. Confusion. Chest pain. Initial encounter. EXAM: CHEST  2 VIEW COMPARISON:  Chest and acute abdomen series 41215 and earlier. FINDINGS: Upright AP and lateral views of the chest. Improved lung base ventilation compared to prior studies. Large lung volumes. No pneumothorax, pulmonary edema, pleural effusion or confluent pulmonary opacity. Stable cardiac size and mediastinal contours. Osteopenia. Chronic severe lower thoracic compression fracture appears stable. Chronic left shoulder arthroplasty. No acute osseous abnormality identified. IMPRESSION: No acute cardiopulmonary abnormality. Electronically Signed   By: Genevie Ann M.D.   On: 09/24/2015 20:51   Ct Abdomen Pelvis W Contrast  09/24/2015  CLINICAL DATA:  Acute onset of lower abdominal pain and groin pain. Nausea. Initial encounter. EXAM: CT ABDOMEN AND PELVIS WITH CONTRAST TECHNIQUE: Multidetector CT imaging of the abdomen and pelvis was performed using the standard protocol following bolus administration of intravenous contrast. CONTRAST:  139mL OMNIPAQUE IOHEXOL 300 MG/ML  SOLN COMPARISON:  CT of the abdomen and pelvis from 11/15/2011 FINDINGS: Mild bibasilar atelectasis or scarring is noted. Diffuse coronary artery calcification is seen.  Mild bilateral gynecomastia is noted. The liver and spleen are unremarkable in appearance. The gallbladder is within normal limits. The pancreas and adrenal glands are unremarkable. The kidneys are unremarkable in appearance. There is no evidence of hydronephrosis. No renal or ureteral stones are seen. Mild nonspecific perinephric stranding is noted bilaterally. No free fluid is identified. The small bowel is unremarkable in appearance. The stomach is within normal limits. No acute vascular abnormalities are seen. Scattered calcification is noted along the abdominal aorta and its branches, including at the origins of the superior mesenteric artery and both renal arteries. The appendix is not definitely characterized; there is no evidence for appendicitis. Scattered diverticulosis is noted along the sigmoid colon, without evidence of diverticulitis. The bladder is mildly distended. Apparent mild anterior bladder wall thickening is nonspecific. Mild surrounding soft tissue inflammation noted. Would correlate for evidence of cystitis. This appears only minimally worsened from 2013. The prostate remains normal in size. No inguinal lymphadenopathy is seen. No acute osseous abnormalities are identified. There is chronic compression deformity of vertebral body T11. Endplate sclerotic change is noted at L4-L5. IMPRESSION: 1. Mild anterior bladder wall thickening is nonspecific. Mild surrounding soft tissue inflammation  noted. Would correlate for evidence of cystitis. The slight bladder wall thickening appears only minimally worsened from 2013. 2. Diffuse coronary artery calcification noted. 3. Mild bilateral gynecomastia noted. 4. Scattered calcification along the abdominal aorta and its branches, including at the origins of the superior mesenteric artery and both renal arteries. 5. Scattered diverticulosis along the sigmoid colon, without evidence of diverticulitis. 6. Mild bibasilar atelectasis or scarring noted. 7.  Chronic compression deformity of vertebral body T11. Electronically Signed   By: Garald Balding M.D.   On: 09/24/2015 23:58      I have personally reviewed and evaluated these images and lab results as part of my medical decision-making.   EKG Interpretation   Date/Time:  Saturday September 24 2015 19:52:46 EST Ventricular Rate:  116 PR Interval:  152 QRS Duration: 84 QT Interval:  326 QTC Calculation: 453 R Axis:   -102 Text Interpretation:  Sinus tachycardia with Premature atrial complexes  Right superior axis deviation Pulmonary disease pattern Right ventricular  hypertrophy with repolarization abnormality Confirmed by Ashok Cordia  MD, Lennette Bihari  ([id number]) on 09/24/2015 9:27:57 PM      MDM   Reviewed nursing notes and prior charts for additional history.   Iv ns. Labs.  Ct.  Delay in ua results.  Pt w new urinary symptoms, freq/urgency, and ?inflammatory changes about bladder on imaging study.  ua is underwhelming, few bacteria, will culture and rx.   Post ct, pt voids 400 cc. Bladder scan 20-25 ml. No signif retention issues.   Rocephin iv in ED.  rx keflex for home.  Recheck pt comfortable. Afeb, normal vitals.   No chest pain or sob.  Pt currently appears stable for d/c.   Return precautions provided.         Lajean Saver, MD 09/25/15 [phone]

## 2015-09-24 NOTE — ED Notes (Signed)
Pt verbalized that he did not wish to answer any of this nurses questions nor did he want me to assess him b/c "the doctor just did that."  I attempted to explain why RN needed to assess him but he did not want RN to assess him.  It was explained that he has had "groin pain" for two weeks.

## 2015-09-24 NOTE — ED Notes (Signed)
Also c/o chest soreness

## 2015-09-24 NOTE — ED Notes (Signed)
The pt is c/o paion in his lower abd for one week    He has had dizziness for  2 weeks with nausea.  He has pain in his groin  He has a knot on his lt foot..  Symptoms from 2 weeks to 4 years.  He sees a family practice doctor

## 2015-09-25 LAB — URINALYSIS, ROUTINE W REFLEX MICROSCOPIC
BILIRUBIN URINE: NEGATIVE
GLUCOSE, UA: NEGATIVE mg/dL
Ketones, ur: 15 mg/dL — AB
Leukocytes, UA: NEGATIVE
Nitrite: NEGATIVE
PROTEIN: NEGATIVE mg/dL
Specific Gravity, Urine: 1.013 (ref 1.005–1.030)
pH: 5 (ref 5.0–8.0)

## 2015-09-25 LAB — URINE MICROSCOPIC-ADD ON

## 2015-09-25 LAB — I-STAT CG4 LACTIC ACID, ED: Lactic Acid, Venous: 1.84 mmol/L (ref 0.5–2.0)

## 2015-09-25 MED ORDER — CEPHALEXIN 500 MG PO CAPS: 500.0000 mg | ORAL_CAPSULE | Freq: Three times a day (TID) | ORAL | Status: DC

## 2015-09-25 MED ORDER — ACETAMINOPHEN 325 MG PO TABS
650.0000 mg | ORAL_TABLET | Freq: Once | ORAL | Status: AC
  Administered 2015-09-25: 650 mg via ORAL
  Filled 2015-09-25: qty 2

## 2015-09-25 MED ORDER — DEXTROSE 5 % IV SOLN
1.0000 g | Freq: Once | INTRAVENOUS | Status: AC
  Administered 2015-09-25: 1 g via INTRAVENOUS
  Filled 2015-09-25: qty 10

## 2015-09-25 NOTE — ED Notes (Signed)
Pt and family member updated on POC, pt and family upset about the long wait and states "you just trying to kill time". Pt oriented that EDP will be there ASAP when he gets all the results available.

## 2015-09-25 NOTE — Discharge Instructions (Signed)
It was our pleasure to provide your ER care today - we hope that you feel better.  Rest. Drink plenty of fluids.  Take keflex (antibiotic) as prescribed, for possible urine infection.  Take tylenol/advil as need for pain.  Follow up with your doctor in the next couple days for recheck if symptoms fail to improve/resolve.  Return to ER if worse, new symptoms, fevers, vomiting, recurrent or persistent chest pain, trouble breathing, other concern.      Abdominal Pain, Adult Many things can cause abdominal pain. Usually, abdominal pain is not caused by a disease and will improve without treatment. It can often be observed and treated at home. Your health care provider will do a physical exam and possibly order blood tests and X-rays to help determine the seriousness of your pain. However, in many cases, more time must pass before a clear cause of the pain can be found. Before that point, your health care provider may not know if you need more testing or further treatment. HOME CARE INSTRUCTIONS Monitor your abdominal pain for any changes. The following actions may help to alleviate any discomfort you are experiencing:  Only take over-the-counter or prescription medicines as directed by your health care provider.  Do not take laxatives unless directed to do so by your health care provider.  Try a clear liquid diet (broth, tea, or water) as directed by your health care provider. Slowly move to a bland diet as tolerated. SEEK MEDICAL CARE IF:  You have unexplained abdominal pain.  You have abdominal pain associated with nausea or diarrhea.  You have pain when you urinate or have a bowel movement.  You experience abdominal pain that wakes you in the night.  You have abdominal pain that is worsened or improved by eating food.  You have abdominal pain that is worsened with eating fatty foods.  You have a fever. SEEK IMMEDIATE MEDICAL CARE IF:  Your pain does not go away within 2  hours.  You keep throwing up (vomiting).  Your pain is felt only in portions of the abdomen, such as the right side or the left lower portion of the abdomen.  You pass bloody or black tarry stools. MAKE SURE YOU:  Understand these instructions.  Will watch your condition.  Will get help right away if you are not doing well or get worse.   This information is not intended to replace advice given to you by your health care provider. Make sure you discuss any questions you have with your health care provider.   Document Released: 05/30/2005 Document Revised: 05/11/2015 Document Reviewed: 04/29/2013 Elsevier Interactive Patient Education [employer].

## 2015-09-26 ENCOUNTER — Ambulatory Visit (INDEPENDENT_AMBULATORY_CARE_PROVIDER_SITE_OTHER): Payer: PPO | Admitting: Family Medicine

## 2015-09-26 VITALS — BP 132/68 | HR 82 | Temp 97.6°F

## 2015-09-26 DIAGNOSIS — E1129 Type 2 diabetes mellitus with other diabetic kidney complication: Secondary | ICD-10-CM

## 2015-09-26 DIAGNOSIS — M25562 Pain in left knee: Secondary | ICD-10-CM

## 2015-09-26 DIAGNOSIS — Z96652 Presence of left artificial knee joint: Secondary | ICD-10-CM | POA: Diagnosis not present

## 2015-09-26 DIAGNOSIS — R42 Dizziness and giddiness: Secondary | ICD-10-CM

## 2015-09-26 LAB — POCT GLYCOSYLATED HEMOGLOBIN (HGB A1C): Hemoglobin A1C: 6.5

## 2015-09-26 MED ORDER — MECLIZINE HCL 25 MG PO TABS: 25.0000 mg | ORAL_TABLET | Freq: Three times a day (TID) | ORAL | Status: DC | PRN

## 2015-09-26 NOTE — Progress Notes (Signed)
   Subjective:    Patient ID: [name] Elliott, male    DOB: [birth year], 82 y.o.   MRN: [id number]  HPI  CC: diabetes follow up  # Diabetes:  Feels he is doing well  Metformin was stopped multiple months ago  Does still check his blood sugar sometimes, usually 130, rarely above 200. ROS: no polyuria or polydipsia, chest pain  # Left knee pain  Had left knee replacement in November  Says recently had developed swelling in the knee as well as significantly painful/tender  Made an appointment with his orthopedic surgeon for later this morning ROS: no fevers or chills  # Dizziness  Going on for 4-5 years  "Sickly" spells. Becomes nauseated and sometimes throws up. Feels like they are getting worse and more frequent  Can't say what brings them on, sometimes standing for prolonged periods or sitting and then standing up.  Can last minutes, up to 10 minutes.   Difficult to interpret what the patient says on this, but does sound like it is a room spinning sensation  During the episodes he does feel weak as well, but feels fine afterward  Daughter concerned about BPPV  Social Hx: former smoker  Review of Systems   See HPI for ROS.   Past medical history, surgical, family, and social history reviewed and updated in the EMR as appropriate. Objective:  BP 132/68 mmHg  Pulse 82  Temp(Src) 97.6 F (36.4 C) (Oral)  Wt   SpO2 99% Vitals and nursing note reviewed  General: no apparent distress, sitting in wheelchair CV: normal rate, regular rhythm, no murmur appreciated. 2+ radial pulses bilaterally  Resp: clear to auscultation bilaterally, normal effort MSK: left knee with well healed midline surgical scar. There is diffuse tenderness to light touch anteriorly, with swelling appreciated compared to the right. Mildly more warm on the left than right  Assessment & Plan:  Type 2 diabetes mellitus, controlled, with renal complications 123456 well controlled, 6.5%. He has been  off of his metformin (reviewed his labs and with his kidney function, risk of restarting metformin would outweigh the benefits as he is under such good control). Follow up 3 months.  Dizziness Rather poor reporter of his symptoms for this issue. As best I can tell it is a room spinning sensation and is only loosely associated with position (worse on standing). Time course does not completely fit with BPPV. This apparently has been going on for years and worsened recently in setting of possible UTI (on antibiotics from ED). Unable to do canal testing in clinic, will refer to PT vestibular rehab. Can do trial of meclizine for symptoms (discussed risk of using this medication in the elderly, daughter will observe him while on it).   Left knee pain S/p TKR. Swollen/slightly red and warm. Will defer this to his orthopedic surgeon as he already had an appointment scheduled 1 hour after his visit here.

## 2015-09-26 NOTE — Patient Instructions (Signed)
Hemoglobin A1c is 6.5, this is very good.  We will stay off of the metformin right now since your kidney function is close to where taking the metformin may put you at risk of reaction.   For your dizziness: made a referral to the Physical therapists. Try the meclizine, but observe him when he takes the medicine as it can have some side effects.

## 2015-09-27 ENCOUNTER — Telehealth: Payer: Self-pay | Admitting: Family Medicine

## 2015-09-27 DIAGNOSIS — R42 Dizziness and giddiness: Secondary | ICD-10-CM | POA: Insufficient documentation

## 2015-09-27 LAB — URINE CULTURE

## 2015-09-27 MED ORDER — MECLIZINE HCL 25 MG PO TABS: 25.0000 mg | ORAL_TABLET | Freq: Three times a day (TID) | ORAL | Status: DC | PRN

## 2015-09-27 NOTE — Telephone Encounter (Signed)
Daughter called because she needs the Meclizine sent to the [facility]. jw

## 2015-09-27 NOTE — Telephone Encounter (Signed)
Left message on voicemail for patient or patient daughter to return call.

## 2015-09-27 NOTE — Assessment & Plan Note (Signed)
A1c well controlled, 6.5%. He has been off of his metformin (reviewed his labs and with his kidney function, risk of restarting metformin would outweigh the benefits as he is under such good control). Follow up 3 months.

## 2015-09-27 NOTE — Telephone Encounter (Signed)
Rx resent to [facility].

## 2015-09-27 NOTE — Addendum Note (Signed)
Addended by: Leone Brand on: 09/27/2015 08:39 PM   Modules accepted: Orders

## 2015-09-27 NOTE — Telephone Encounter (Signed)
Will forward to Dr. Lamar Benes as he prescribed this medication.

## 2015-09-27 NOTE — Assessment & Plan Note (Signed)
Rather poor reporter of his symptoms for this issue. As best I can tell it is a room spinning sensation and is only loosely associated with position (worse on standing). Time course does not completely fit with BPPV. This apparently has been going on for years and worsened recently in setting of possible UTI (on antibiotics from ED). Unable to do canal testing in clinic, will refer to PT vestibular rehab. Can do trial of meclizine for symptoms (discussed risk of using this medication in the elderly, daughter will observe him while on it).

## 2015-09-28 ENCOUNTER — Telehealth (HOSPITAL_BASED_OUTPATIENT_CLINIC_OR_DEPARTMENT_OTHER): Payer: Self-pay | Admitting: Emergency Medicine

## 2015-09-28 NOTE — Telephone Encounter (Signed)
Post ED Visit - Positive Culture Follow-up  Culture report reviewed by antimicrobial stewardship pharmacist:  []  Elenor Quinones, Pharm.D. []  Heide Guile, Pharm.D., BCPS []  Parks Neptune, Pharm.D. []  Alycia Rossetti, Pharm.D., BCPS []  [place], [place].D., BCPS, AAHIVP []  Legrand Como, Pharm.D., BCPS, AAHIVP []  Cassie Stewart, Pharm.D. []  Stephens [place], [place].D.  Positive urine culture Klebsiella Treated with cephalexin, organism sensitive to the same and no further patient follow-up is required at this time.  Hazle Nordmann 09/28/2015, 9:43 AM

## 2015-09-30 NOTE — Telephone Encounter (Signed)
Left another message for patient/daughter to return call.

## 2015-10-03 ENCOUNTER — Other Ambulatory Visit: Payer: Self-pay | Admitting: Family Medicine

## 2015-10-03 NOTE — Telephone Encounter (Signed)
Needs refills on gabapentin and furosemide.  [name] Elliott

## 2015-10-04 MED ORDER — GABAPENTIN 300 MG PO CAPS: 600.0000 mg | ORAL_CAPSULE | Freq: Three times a day (TID) | ORAL | Status: DC

## 2015-10-04 MED ORDER — FUROSEMIDE 40 MG PO TABS: 40.0000 mg | ORAL_TABLET | Freq: Every day | ORAL | Status: DC

## 2015-10-06 NOTE — Telephone Encounter (Signed)
Left message for patient to return call.

## 2015-10-13 ENCOUNTER — Telehealth: Payer: Self-pay | Admitting: Family Medicine

## 2015-10-13 DIAGNOSIS — R42 Dizziness and giddiness: Secondary | ICD-10-CM

## 2015-10-13 NOTE — Telephone Encounter (Signed)
I do not see any referral for ENT, will forward to last MD seen for this issue.

## 2015-10-13 NOTE — Telephone Encounter (Signed)
Daughter called and was checking the status of her father's referral to see and ENT. She said that no one has called to set up the appointment to check her father's ear to see if he has vertigo.jw

## 2015-10-27 NOTE — Telephone Encounter (Signed)
Left message for daughter. I did not order the PT referral correctly, I have fixed t his and they should be getting a call from the PT. If they do not I asked her to call back next week.

## 2015-11-01 ENCOUNTER — Encounter: Payer: Self-pay | Admitting: Cardiovascular Disease

## 2015-11-01 ENCOUNTER — Ambulatory Visit (INDEPENDENT_AMBULATORY_CARE_PROVIDER_SITE_OTHER): Payer: PPO | Admitting: Cardiovascular Disease

## 2015-11-01 VITALS — BP 100/64 | HR 82 | Ht 62.0 in | Wt 153.4 lb

## 2015-11-01 DIAGNOSIS — M25552 Pain in left hip: Secondary | ICD-10-CM | POA: Diagnosis not present

## 2015-11-01 DIAGNOSIS — I2510 Atherosclerotic heart disease of native coronary artery without angina pectoris: Secondary | ICD-10-CM | POA: Diagnosis not present

## 2015-11-01 DIAGNOSIS — I5042 Chronic combined systolic (congestive) and diastolic (congestive) heart failure: Secondary | ICD-10-CM | POA: Diagnosis not present

## 2015-11-01 DIAGNOSIS — M545 Low back pain: Secondary | ICD-10-CM | POA: Diagnosis not present

## 2015-11-01 DIAGNOSIS — Z96652 Presence of left artificial knee joint: Secondary | ICD-10-CM | POA: Diagnosis not present

## 2015-11-01 MED ORDER — RIVAROXABAN 15 MG PO TABS: 15.0000 mg | ORAL_TABLET | Freq: Every day | ORAL | Status: DC

## 2015-11-01 MED ORDER — FUROSEMIDE 40 MG PO TABS: 40.0000 mg | ORAL_TABLET | Freq: Every day | ORAL | Status: DC

## 2015-11-01 NOTE — Progress Notes (Signed)
Cardiology Office Note   Date:  11/01/2015   ID:  [name] Elliott, DOB Sep 29, 1927, MRN [id number]  PCP:  Lupita Dawn, MD  Cardiologist:   Thayer Headings, MD   Chief Complaint  Patient presents with  . Hypertension    SWELLING IN THE LEGS. NO CHEST PAIN OR SOB. NO OTHER COMPLAINTS   Problem list:  1. Moderate coronary artery disease 2. Dementia 3. Peripheral vascular disease 4. CVA 5.   History of Present Illness:  82 year old gentleman with a history of moderate diffuse coronary artery disease. We treated medically. The left anterior descending arteries/ 1st diagonal stenosis has not was not suitable for PCI.  He complains of being sick in general is weak sensation. He also complains of some left arm pain. When I saw him last month. He was having some episodes of chest pain. We were trying to decide whether or not these were due to angina. I asked him to take nitroglycerin when he had these episodes of pain. At times the nitroglycerin helps him and at other times the nitroglycerin did not do anything. When he walks up a hill in his backyard he has significant shortness breath and this "sick feeling".  He has had a partial shoulder replacement for a shoulder fracture. He has had chronic shoulder pain since that time.  He has chronic knee pain and is considering knee surgery. His office visit was for the purpose of preoperative evaluation.  Sept. 17, 2013- He has no cardiac complaints. He denies any chest pain or dyspnea. He still has some left shoulder stiffness from his surgery February 05, 2012. He has had some wheezing and was recently started on steroids and an antibiotic.  He also ws diagnosed with peripheral neurophy He was prescribed gabapentin but his daughter did not fill the medication because it was too expensive.  She bought some over-the-counter vitamin B12 which seems to be helping a little bit.  November 26, 2012:  [name] Elliott was recently hospitalized last  week for peptic ulcer Disease. He had vomiting of coffee-ground emesis. He also had blood in his stool. [facility] discontinued his Gabriel Earing powders and his Fosamax. He seems to be doing well from a cardiac standpoint. No angina.  He is having trouble with leg pain.   Jan. 20, 2015:  No CP. He has lots of fatigue especially if he tries to walk any distance.  April 21, 2014,   November 02, 2014;  [name] Elliott is a 82 y.o. male who presents for his coronary artery disease.   He was seen by Richardson Dopp several weeks ago. He had a stroke back in November. We've placed a event monitor looking for atrial fib . There was no evidence of atrial fibrillation on the monitor. There is some mention of atrial fibrillation in past charts but we've not been able to locate any confirmation of that.  He complains of leg pain today.   He keeps his legs elevated 3-4 hours a day.  Has seen [facility] and they had no solutions for his leg pain.    No additional stroke symptoms.  He was seen with his granddaughter this am.  Aug. 30, 2016:  He has been diagnosed with DVTs.  Has been on xarelto. Needs to have knee  Replacement .   Feb. 28, 2017: Still having lot of left leg swelling from his left TKA . No CP . Breathing is ok Unable to walk very far at all   Past Medical History  Diagnosis Date  . Constipation, chronic   . Sebaceous cyst   . Other malaise and fatigue   . Pain in joint, shoulder region     Has chronic shoulder pain  . Persistent disorder of initiating or maintaining sleep   . Thrombocytopenia, unspecified ([place])   . Vascular dementia without behavioral disturbance     with periods of amnesia  . Other B-complex deficiencies   . Pain in limb   . Esophageal reflux   . Unspecified essential hypertension   . Arthropathy, unspecified, site unspecified   . Pure hypercholesterolemia   . Orthostasis   . PVC's (premature ventricular contractions)   . S/P cardiac cath 08/08/11    mild to  moderate CAD primarily in the LAD. None are obstructive and appear stable from prior cath in 2007; managed medically  . Arthritis   . CAD (coronary artery disease)     last cath in 2012. Managed medically-some blockages  . Headache(784.0)   . 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 MRI Nov '11 - tendonosis and partial tear. Left shoulder surgery Jan '12 for partial shoulder replacement. [name] Elliott)   . Pneumonia 1980's?; 2011  . History of stomach ulcers 11/2012  . Chronic lower back pain   . Atrial fibrillation ([place])   . DVT (deep venous thrombosis) ([place])   . Stroke ([place])   . Dysrhythmia   . Hypothyroidism     pt states he doeen't have   . Diabetes mellitus without complication ([place])     pt states he doesn't have  . Blood dyscrasia     thromboctopenia pt family states he was never told of this  . Primary localized osteoarthritis of left knee 07/12/2015    Past Surgical History  Procedure Laterality Date  . Hand surgery Right     crush injury, right fifth digit contracture, limited  motion  . Lumbar spine surgery  2007    Dr Philip Aspen ([place])  . Knee surgery Left 1991    "did it twice in 1 wk" (02/17/2013)  . Cataract extraction Left 2009  . Shoulder open rotator cuff repair Left 8.30.2011  . US echocardiography  02-28-2010    Est EF 50-55%  . Cardiac catheterization  08/2011  . Shoulder hemi-arthroplasty    . Finger amputation Right     pinky finger  . Hardware removal  02/05/2012    Procedure: HARDWARE REMOVAL;  Surgeon: Johnny Bridge, MD;  Location: [facility];  Service: Orthopedics;  Laterality: Left;  . Reverse shoulder arthroplasty  02/05/2012    Procedure: REVERSE SHOULDER ARTHROPLASTY;  Surgeon: Johnny Bridge, MD;  Location: [facility];  Service: Orthopedics;  Laterality: Left;  . Back surgery    . Esophagogastroduodenoscopy N/A 11/19/2012    Procedure: ESOPHAGOGASTRODUODENOSCOPY (EGD);  Surgeon: Jerene Bears, MD;  Location: [facility];  Service:  Gastroenterology;  Laterality: N/A;  . Colonoscopy    . Total knee arthroplasty Left 07/12/2015    Procedure: TOTAL KNEE ARTHROPLASTY;  Surgeon: Marchia Bond, MD;  Location: [facility];  Service: Orthopedics;  Laterality: Left;     Current Outpatient Prescriptions  Medication Sig Dispense Refill  . aspirin EC 81 MG tablet Take 81 mg by mouth daily.     . baclofen (LIORESAL) 10 MG tablet Take 1 tablet (10 mg total) by mouth 3 (three) times daily. As needed for muscle spasm 50 tablet 0  . furosemide (LASIX) 40 MG tablet Take 1 tablet (40 mg total) by mouth daily.  30 tablet 3  . gabapentin (NEURONTIN) 300 MG capsule Take 2 capsules (600 mg total) by mouth 3 (three) times daily. 180 capsule 3  . isosorbide mononitrate (IMDUR) 60 MG 24 hr tablet Take 1 tablet (60 mg total) by mouth every morning. 90 tablet 3  . magnesium hydroxide (MILK OF MAGNESIA) 400 MG/5ML suspension Take 15 mLs by mouth daily as needed for mild constipation or moderate constipation.    . meclizine (ANTIVERT) 25 MG tablet Take 1 tablet (25 mg total) by mouth 3 (three) times daily as needed for dizziness. 30 tablet 0  . omeprazole (PRILOSEC) 20 MG capsule Take 1 capsule by mouth twice a day before a meal 180 capsule 1  . ondansetron (ZOFRAN) 4 MG tablet Take 1 tablet (4 mg total) by mouth every 8 (eight) hours as needed for nausea or vomiting. 30 tablet 0  . oxyCODONE-acetaminophen (ROXICET) 5-325 MG tablet Take 1-2 tablets by mouth every 4 (four) hours as needed for severe pain. 180 tablet 0  . potassium chloride SA (K-DUR,KLOR-CON) 20 MEQ tablet Take 1 tablet (20 mEq total) by mouth 2 (two) times daily. 180 tablet 3  . sennosides-docusate sodium (SENOKOT-S) 8.6-50 MG tablet Take 2 tablets by mouth daily. 30 tablet 1  . Rivaroxaban (XARELTO) 15 MG TABS tablet Take 1 tablet (15 mg total) by mouth daily. (Patient not taking: Reported on 11/01/2015) 30 tablet 0  . [DISCONTINUED] Hyoscyamine Sulfate (HYOMAX-DT) 0.375 MG TBCR Take 1 tab  bid for 5 days, then prn abd pain 30 each 1   No current facility-administered medications for this visit.    Allergies:   Crestor    Social History:  The patient  reports that he quit smoking about 37 years ago. His smoking use included Cigars. His smokeless tobacco use includes Chew. He reports that he does not drink alcohol or use illicit drugs.   Family History:  The patient's family history includes Breast cancer in his mother; Cancer in his brother and mother; Diabetes in his daughter, mother, and sister; Kidney disease in his father; Stroke in his brother. There is no history of Colon cancer or Heart attack.    ROS:  Please see the history of present illness.    Review of Systems: Constitutional:  denies fever, chills, diaphoresis, appetite change and fatigue.  HEENT: denies photophobia, eye pain, redness, hearing loss, ear pain, congestion, sore throat, rhinorrhea, sneezing, neck pain, neck stiffness and tinnitus.  Respiratory: denies SOB, DOE, cough, chest tightness, and wheezing.  Cardiovascular: denies chest pain, palpitations and leg swelling.  Gastrointestinal: denies nausea, vomiting, abdominal pain, diarrhea, constipation, blood in stool.  Genitourinary: denies dysuria, urgency, frequency, hematuria, flank pain and difficulty urinating.  Musculoskeletal: admits to  Leg pain    Skin: admits to  Wound on his legs   Neurological: denies dizziness, seizures, syncope, weakness, light-headedness, numbness and headaches.   Hematological: denies adenopathy, easy bruising, personal or family bleeding history.  Psychiatric/ Behavioral: denies suicidal ideation, mood changes, confusion, nervousness, sleep disturbance and agitation.       All other systems are reviewed and negative.    PHYSICAL EXAM: VS:  BP 100/64 mmHg  Pulse 82  Ht 5\' 2"  (1.575 m)  Wt 153 lb 6.4 oz (69.582 kg)  BMI 28.05 kg/m2  SpO2 95% , BMI Body mass index is 28.05 kg/(m^2). GEN: Well nourished, well  developed, in no acute distress HEENT: normal Neck: no JVD, carotid bruits, or masses Cardiac: RRR; soft systolic murmur  rubs, or gallops,  1+ bilateral leg edema  Respiratory:  clear to auscultation bilaterally, normal work of breathing GI: soft, nontender, nondistended, + BS MS: legs have hair loss.  Appear to have chronic ischemic changes.  Several sores. 1 + leg edema  Skin: warm and dry, no rash Neuro:  Moves slow.  Was able to get up on the table himself Psych: ? Mild dementia , slow speech    EKG:  EKG is not ordered today. The ekg ordered today demonstrates    Recent Labs: 09/24/2015: ALT 8*; BUN 20; Creatinine, Ser 1.48*; Hemoglobin 11.9*; Platelets 273; Potassium 4.4; Sodium 135    Lipid Panel    Component Value Date/Time   CHOL 155 08/02/2014 0334   TRIG 46 08/02/2014 0334   HDL 59 08/02/2014 0334   CHOLHDL 2.6 08/02/2014 0334   VLDL 9 08/02/2014 0334   LDLCALC 87 08/02/2014 0334   LDLDIRECT 140.6 09/25/2013 1030      Wt Readings from Last 3 Encounters:  11/01/15 153 lb 6.4 oz (69.582 kg)  07/12/15 151 lb (68.493 kg)  07/01/15 151 lb 4.8 oz (68.629 kg)      Other studies Reviewed: Additional studies/ records that were reviewed today include:  Review of the above records demonstrates:    ASSESSMENT AND PLAN:  1. Moderate coronary artery disease - he's not having any episodes of angina.  2. Dementia  3. Peripheral vascular disease- he has chronic leg pain. He has seen Dr. Trula Slade and there is not much more that can be done for his leg pain.   4. CVA - he's not had any recurrent strokes. There was no evidence of atrial fib on the 30 day event monitor. We'll continue to follow.  5. DVT:  He was found have a deep vein thrombosis recent. He is now on Xarelto. I've cautioned the patient and his family that he should be very careful and avoid falls. He ran out of his Xarelto,  i've encouraged him to start taking it again .   Will reorder .  6. Knee  arthritis: The patient may need to have knee replacement. I have told him that he would be at moderate to high risk for knee replacement given his heart disease and peripheral vascular disease and underlying medical issues.  7. Chronic combined systolic and diastolic congestive heart failure :   He ran out of his lasix.   He's been having increased leg swelling sincerely out of his Will reorder.     Current medicines are reviewed at length with the patient today.  The patient does not have concerns regarding medicines.  The following changes have been made:  no change   Disposition:   FU with me in 6  months     Signed, Nahser, Wonda Cheng, MD  11/01/2015 3:22 PM    [facility] [address], [place], [place]  [address] Phone: [phone]; Fax: [phone]

## 2015-11-01 NOTE — Patient Instructions (Signed)
Medication Instructions:  Continue Lasix and potassium Continue Xarelto   Labwork: Your physician recommends that you return for lab work in: 6 months on the day of or a few days before your office visit with Dr. Acie Fredrickson.  You will need to FAST for this appointment - nothing to eat or drink after midnight the night before except water.   Testing/Procedures: None Ordered   Follow-Up: Your physician wants you to follow-up in: 6 months with Dr. Acie Fredrickson.  You will receive a reminder letter in the mail two months in advance. If you don't receive a letter, please call our office to schedule the follow-up appointment.   If you need a refill on your cardiac medications before your next appointment, please call your pharmacy.   Thank you for choosing [facility]! Christen Bame, RN [phone]

## 2015-11-08 ENCOUNTER — Ambulatory Visit: Payer: PPO | Attending: Family Medicine | Admitting: Physical Therapy

## 2015-11-08 ENCOUNTER — Other Ambulatory Visit: Payer: Self-pay | Admitting: Internal Medicine

## 2015-11-08 ENCOUNTER — Other Ambulatory Visit: Payer: Self-pay | Admitting: Cardiovascular Disease

## 2015-11-08 DIAGNOSIS — H8112 Benign paroxysmal vertigo, left ear: Secondary | ICD-10-CM

## 2015-11-08 DIAGNOSIS — R269 Unspecified abnormalities of gait and mobility: Secondary | ICD-10-CM | POA: Diagnosis not present

## 2015-11-08 NOTE — Therapy (Addendum)
[facility] [address] [place], [place], [address] Phone: [phone]   Fax:  [phone]  Physical Therapy Evaluation  Patient Details  Name: [name] Elliott MRN: [id number] Date of Birth: 05/14/1928 Referring Provider: Zenia Resides  Encounter Date: 11/08/2015      PT End of Session - 11/08/15 1539    Visit Number 1   Number of Visits 5   Date for PT Re-Evaluation 12/06/15   PT Start Time 1500   PT Stop Time 1538   PT Time Calculation (min) 38 min   Activity Tolerance Patient tolerated treatment well   Behavior During Therapy [facility] for tasks assessed/performed      Past Medical History  Diagnosis Date  . Constipation, chronic   . Sebaceous cyst   . Other malaise and fatigue   . Pain in joint, shoulder region     Has chronic shoulder pain  . Persistent disorder of initiating or maintaining sleep   . Thrombocytopenia, unspecified ([place])   . Vascular dementia without behavioral disturbance     with periods of amnesia  . Other B-complex deficiencies   . Pain in limb   . Esophageal reflux   . Unspecified essential hypertension   . Arthropathy, unspecified, site unspecified   . Pure hypercholesterolemia   . Orthostasis   . PVC's (premature ventricular contractions)   . S/P cardiac cath 08/08/11    mild to moderate CAD primarily in the LAD. None are obstructive and appear stable from prior cath in 2007; managed medically  . Arthritis   . CAD (coronary artery disease)     last cath in 2012. Managed medically-some blockages  . Headache(784.0)   . 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 MRI Nov '11 - tendonosis and partial tear. Left shoulder surgery Jan '12 for partial shoulder replacement. [name])   . Pneumonia 1980's?; 2011  . History of stomach ulcers 11/2012  . Chronic lower back pain   . Atrial fibrillation ([place])   . DVT (deep venous thrombosis) ([place])   . Stroke ([place])   .  Dysrhythmia   . Hypothyroidism     pt states he doeen't have   . Diabetes mellitus without complication ([place])     pt states he doesn't have  . Blood dyscrasia     thromboctopenia pt family states he was never told of this  . Primary localized osteoarthritis of left knee 07/12/2015    Past Surgical History  Procedure Laterality Date  . Hand surgery Right     crush injury, right fifth digit contracture, limited  motion  . Lumbar spine surgery  2007    Dr Philip Aspen ([place])  . Knee surgery Left 1991    "did it twice in 1 wk" (02/17/2013)  . Cataract extraction Left 2009  . Shoulder open rotator cuff repair Left 8.30.2011  . US echocardiography  02-28-2010    Est EF 50-55%  . Cardiac catheterization  08/2011  . Shoulder hemi-arthroplasty    . Finger amputation Right     pinky finger  . Hardware removal  02/05/2012    Procedure: HARDWARE REMOVAL;  Surgeon: Johnny Bridge, MD;  Location: [facility];  Service: Orthopedics;  Laterality: Left;  . Reverse shoulder arthroplasty  02/05/2012    Procedure: REVERSE SHOULDER ARTHROPLASTY;  Surgeon: Johnny Bridge, MD;  Location: [facility];  Service: Orthopedics;  Laterality: Left;  . Back surgery    . Esophagogastroduodenoscopy N/A 11/19/2012    Procedure:  ESOPHAGOGASTRODUODENOSCOPY (EGD);  Surgeon: Jerene Bears, MD;  Location: [facility];  Service: Gastroenterology;  Laterality: N/A;  . Colonoscopy    . Total knee arthroplasty Left 07/12/2015    Procedure: TOTAL KNEE ARTHROPLASTY;  Surgeon: Marchia Bond, MD;  Location: [facility];  Service: Orthopedics;  Laterality: Left;    There were no vitals filed for this visit.  Visit Diagnosis:  BPPV (benign paroxysmal positional vertigo), left - Plan: PT plan of care cert/re-cert  Abnormality of gait - Plan: PT plan of care cert/re-cert      Subjective Assessment - 11/08/15 1502    Subjective Pt is a 82 y.o male who presents to OPPT for vertigo.  Pt states he "gets sicklin' all over" with c/o nausea and  "swimmy headed."  Pt and daughter report symptoms x 5 years.   Patient is accompained by: Family member   Patient Stated Goals improve symptoms; "make me feel better"   Currently in Pain? Yes   Pain Score 7    Pain Location Leg   Pain Orientation Right;Left   Pain Descriptors / Indicators --  "hurts bad enough to wake me up"   Pain Type Chronic pain   Pain Onset More than a month ago   Pain Frequency Constant   Aggravating Factors  will monitor however will not directly address            [facility] PT Assessment - 11/08/15 1505    Assessment   Medical Diagnosis vertigo   Referring Provider Madison Hickman A   Onset Date/Surgical Date --  5 yrs ago   Next MD Visit PRN   Prior Therapy post surgical PT; none for vertigo   Precautions   Precautions Fall   Restrictions   Weight Bearing Restrictions No   Balance Screen   Has the patient fallen in the past 6 months No   Has the patient had a decrease in activity level because of a fear of falling?  Yes   Is the patient reluctant to leave their home because of a fear of falling?  No   Home Environment   Living Environment Private residence   Living Arrangements Alone   Type of [place] to enter   Entrance Stairs-Number of Steps 2   Entrance Stairs-Rails None   Home Layout One level   Prior Function   Level of Independence Requires assistive device for independence   Vocation Retired   Leisure "sit in the chair"   Observation/Other Assessments   Focus on Therapeutic Outcomes (FOTO)  27 (73% limited; predicted 40% limited)   Dizziness Handicap Inventory [facility])  56            Vestibular Assessment - 11/08/15 1510    Symptom Behavior   Type of Dizziness Blurred vision   Frequency of Dizziness 2-3 times/day   Duration of Dizziness unable to provide clear answer   Aggravating Factors Sit to stand   Relieving Factors Closing eyes;Rest  "going to sleep"   Occulomotor Exam   Occulomotor Alignment  Normal   Spontaneous Absent   Gaze-induced Absent   Smooth Pursuits Intact   Saccades Intact   Vestibulo-Occular Reflex   VOR 1 Head Only (x 1 viewing) WNL with mild increase in symptoms   Comment positive head thrust to L; negative to R   Positional Testing   Dix-Hallpike Dix-Hallpike Right;Dix-Hallpike Left   Horizontal Canal Testing Horizontal Canal Right;Horizontal Canal Left   Dix-Hallpike Right   Dix-Hallpike  Right Duration none   Dix-Hallpike Right Symptoms No nystagmus   Dix-Hallpike Left   Dix-Hallpike Left Duration 45 sec   Dix-Hallpike Left Symptoms Upbeat, left rotatory nystagmus;Right nystagmus   Horizontal Canal Right   Horizontal Canal Right Duration none   Horizontal Canal Right Symptoms Normal   Horizontal Canal Left   Horizontal Canal Left Duration none   Horizontal Canal Left Symptoms Normal                Vestibular Treatment/Exercise - 2015/11/30 1538    Vestibular Treatment/Exercise   Vestibular Treatment Provided Canalith Repositioning   Canalith Repositioning Epley Manuever Left    EPLEY MANUEVER LEFT   Number of Reps  1    RESPONSE DETAILS LEFT pt without symptoms after 1 rep of epley's               PT Education - 11-30-15 1538    Education provided Yes   Education Details BPPV   Person(s) Educated Patient;Child(ren)   Methods Explanation;Handout   Comprehension Verbalized understanding             PT Long Term Goals - 2015-11-30 1542    PT LONG TERM GOAL #1   Title demonstrate negative positional testing (12/06/15)   Time 4   Period Weeks   Status New   PT LONG TERM GOAL #2   Title perform balance assessment with formal goals to be written as indicated (12/06/15)   Time 4   Period Weeks   Status New               Plan - 11-30-15 1539    Clinical Impression Statement Pt is an 82 y/o male who presents to OPPT with c/o vertigo and feeling  "swimmy headed."  Pt poor historian and daughter provided most of history.   Clinical findings consistent with L posterior canal BPPV; with possible cupulolithiasis given onset 5 yrs ago.  Pt demonstrates guarded gait and would benefit from formal balance assessment once vertigo clears; and tx as indicated.   Pt will benefit from skilled therapeutic intervention in order to improve on the following deficits Decreased balance;Dizziness   Rehab Potential Good   PT Frequency 1x / week   PT Duration 4 weeks   PT Treatment/Interventions ADLs/Self Care Home Management;Canalith Repostioning;Balance training;Neuromuscular re-education;Patient/family education;Therapeutic activities;Therapeutic exercise;Functional mobility training;Stair training;Gait training   PT Next Visit Plan reassess positional testing; tx as indicated; balance assessment once vertigo cleared   Consulted and Agree with Plan of Care Patient          G-Codes - 11/30/15 1543    Functional Assessment Tool Used DHI 56   Functional Limitation Changing and maintaining body position   Changing and Maintaining Body Position Current Status (S8270) At least 40 percent but less than 60 percent impaired, limited or restricted   Changing and Maintaining Body Position Goal Status (B8675) At least 20 percent but less than 40 percent impaired, limited or restricted     Changing and Maintaining Body Position Discharge Status: CK (addendum 11/24/15)  Problem List Patient Active Problem List   Diagnosis Date Noted  . Dizziness 09/27/2015  . Primary localized osteoarthritis of left knee 07/12/2015  . Debility 02/23/2015  . Superficial hematoma 01/25/2015  . Encounter for chronic pain management 01/25/2015  . Injury of right elbow 01/13/2015  . Injury of right forearm 01/13/2015  . Inguinal lymphadenopathy 12/28/2014  . DVT (deep venous thrombosis) ([place]) 12/14/2014  . Combined congestive systolic and diastolic heart failure (  [place]) 12/14/2014  . Cerebral infarction due to unspecified mechanism   . TIA (transient  ischemic attack) 08/01/2014  . Varicose veins of both legs with edema 05/07/2014  . Bilateral leg pain 04/16/2014  . Chronic kidney disease, stage III (moderate) 12/21/2013  . Vascular dementia without behavioral disturbance   . Type 2 diabetes mellitus, controlled, with renal complications ([place]) 86/75/1982  . Diastolic dysfunction 42/99/8069  . Knee pain, bilateral 03/01/2013  . Unspecified hereditary and idiopathic peripheral neuropathy 11/26/2012  . Osteoporosis, unspecified 11/26/2012  . GI bleed due to NSAIDs, DDX=Isch colitis, infectious colitis 11/19/2012  . Cough 05/29/2012  . 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 CONSTIPATION, CHRONIC 08/02/2010  . INSOMNIA, CHRONIC 03/08/2010  . Unspecified hypothyroidism 01/04/2010  . VITAMIN B12 DEFICIENCY 01/04/2010  . HYPERCHOLESTEROLEMIA 08/12/2009  . Essential hypertension 08/12/2009  . Coronary atherosclerosis of native coronary artery 08/12/2009  . GERD 08/12/2009   Laureen Abrahams, PT, DPT 11/08/2015 3:44 PM  [facility] [address] [place], [place], [address] Phone: [phone]   Fax:  [phone]  Name: [name] Elliott MRN: [id number] Date of Birth: 1928-08-28    PHYSICAL THERAPY DISCHARGE SUMMARY  Visits from Start of Care: 1  Current functional level related to goals / functional outcomes: See above; pt's daughter called and canceled remaining appts as pt refused to return to PT.  Daughter understood benefits of PT but stated father refused to attend.   Remaining deficits: Unknown as pt did not return   Education / Equipment: tx BPPV on initial eval but unsure if vertigo cleared.  Plan: Patient agrees to discharge.  Patient goals were not met. Patient is being discharged due to the patient's request.  ?????    Laureen Abrahams, PT, DPT 11/24/2015 9:46 AM  [facility] [address] [place], [place]  [address]  [phone] (office) [phone] (fax)

## 2015-11-08 NOTE — Telephone Encounter (Signed)
Received refill requests for gabapentin and omeprazole for this patient. Is he one of your home patients or nursing home patients?

## 2015-11-09 NOTE — Telephone Encounter (Signed)
Refill refusal sent back to pharmacy

## 2015-11-09 NOTE — Telephone Encounter (Signed)
I saw him in the past but he switched doctors Deny the request--they need to ask his current doctor

## 2015-11-10 ENCOUNTER — Other Ambulatory Visit: Payer: Self-pay | Admitting: Family Medicine

## 2015-11-10 ENCOUNTER — Telehealth: Payer: Self-pay | Admitting: Family Medicine

## 2015-11-10 MED ORDER — POTASSIUM CHLORIDE CRYS ER 20 MEQ PO TBCR: 20.0000 meq | EXTENDED_RELEASE_TABLET | Freq: Two times a day (BID) | ORAL | Status: DC

## 2015-11-10 MED ORDER — GABAPENTIN 300 MG PO CAPS: 600.0000 mg | ORAL_CAPSULE | Freq: Three times a day (TID) | ORAL | Status: DC

## 2015-11-10 NOTE — Telephone Encounter (Signed)
Daughter called because her father needs refills on his gabapentin and Potassium called in to his online pharmacy. jw

## 2015-11-10 NOTE — Telephone Encounter (Signed)
Pt called

## 2015-11-15 NOTE — Telephone Encounter (Signed)
Attempted to contact [place] again with no answer.

## 2015-11-15 NOTE — Telephone Encounter (Addendum)
[facility] called to ask that the rx for gabapentin be authorized for 90 day supply.  Can call [place] back at [phone].  Fax [phone]

## 2015-11-15 NOTE — Telephone Encounter (Signed)
Left message for Sharl Ma to return my call.

## 2015-11-16 MED ORDER — GABAPENTIN 300 MG PO CAPS: 600.0000 mg | ORAL_CAPSULE | Freq: Three times a day (TID) | ORAL | Status: DC

## 2015-11-16 NOTE — Telephone Encounter (Signed)
Sent 90 day supply of Gabapentin to [facility] pharmacy via electronic prescription.

## 2015-11-18 ENCOUNTER — Ambulatory Visit: Payer: PPO

## 2015-11-22 ENCOUNTER — Other Ambulatory Visit: Payer: Self-pay | Admitting: *Deleted

## 2015-11-22 DIAGNOSIS — I5042 Chronic combined systolic (congestive) and diastolic (congestive) heart failure: Secondary | ICD-10-CM

## 2015-11-23 MED ORDER — FUROSEMIDE 40 MG PO TABS: 40.0000 mg | ORAL_TABLET | Freq: Every day | ORAL | Status: DC

## 2015-11-23 MED ORDER — OMEPRAZOLE 20 MG PO CPDR: 20.0000 mg | DELAYED_RELEASE_CAPSULE | Freq: Two times a day (BID) | ORAL | Status: DC

## 2015-11-24 ENCOUNTER — Ambulatory Visit: Payer: PPO | Admitting: Physical Therapy

## 2015-11-30 ENCOUNTER — Encounter: Payer: PPO | Admitting: Physical Therapy

## 2015-12-01 ENCOUNTER — Encounter: Payer: Self-pay | Admitting: Family Medicine

## 2015-12-01 ENCOUNTER — Ambulatory Visit (INDEPENDENT_AMBULATORY_CARE_PROVIDER_SITE_OTHER): Payer: PPO | Admitting: Family Medicine

## 2015-12-01 VITALS — BP 128/62 | HR 72 | Temp 97.9°F | Wt 161.5 lb

## 2015-12-01 DIAGNOSIS — M545 Low back pain, unspecified: Secondary | ICD-10-CM

## 2015-12-01 DIAGNOSIS — R059 Cough, unspecified: Secondary | ICD-10-CM

## 2015-12-01 DIAGNOSIS — R05 Cough: Secondary | ICD-10-CM | POA: Diagnosis not present

## 2015-12-01 DIAGNOSIS — R351 Nocturia: Secondary | ICD-10-CM | POA: Diagnosis not present

## 2015-12-01 LAB — POCT UA - MICROSCOPIC ONLY

## 2015-12-01 LAB — POCT URINALYSIS DIP (MANUAL ENTRY)
BILIRUBIN UA: NEGATIVE
Bilirubin, UA: NEGATIVE
Blood, UA: NEGATIVE
Glucose, UA: NEGATIVE
NITRITE UA: NEGATIVE
PROTEIN UA: NEGATIVE
Spec Grav, UA: 1.02
Urobilinogen, UA: 0.2
pH, UA: 5.5

## 2015-12-01 NOTE — Progress Notes (Signed)
   Subjective:    Patient ID: [name] Elliott, male    DOB: 26-Jan-1928, 82 y.o.   MRN: [id number]  HPI  Patient presents for Same Day Appointment  CC: low back pain, cough  # Cough and low back pain:  Cough started about 6 days ago  Productive of some yellow sputum  Back pain started 4 days ago, left lower back. Worse with the cough.  Also having some increased urinary frequency, started about 4 days ago as well, going 3-4 times at night which is not normal for him.  ROS: no fevers, no shortness of breath, no dysuria, no hematuria, no hemoptysis  Social Hx: former smoker  Review of Systems   See HPI for ROS.   Past medical history, surgical, family, and social history reviewed and updated in the EMR as appropriate.  Objective:  BP 128/62 mmHg  Pulse 72  Temp(Src) 97.9 F (36.6 C) (Oral)  Wt 161 lb 8 oz (73.256 kg) Vitals and nursing note reviewed  General: no apparent distress  CV: normal rate, regular rhythm, no murmurs, rubs or gallop.  Resp: faint expiratory crackles LLL however this goes away after 4-5 breaths, good air movement, normal effort Abdomen: no suprapubic tenderness Back: not really tender to palpation over left lower costal margin, however there is pain with percussion. No skin rashes   Assessment & Plan:   1. Low back pain, non-specific UA essentially normal (trace leuks, no blood), I suspect he is having MSK pain in setting of cough, there are no radicular signs or red flag symptoms. Patient had left before UA and full discussion but I spoke with the daughter and told her I would treat him with OTC tylenol, some cough/cold medicines, follow up if worsening or not showing much improving next 1-2 weeks. - POCT urinalysis dipstick  2. Frequent urination at night As above UA not much evidence for infection, no blood. This may be related to prostate issues. Follow up if not improving. - POCT urinalysis dipstick

## 2015-12-06 ENCOUNTER — Other Ambulatory Visit: Payer: Self-pay | Admitting: *Deleted

## 2015-12-06 ENCOUNTER — Encounter: Payer: PPO | Admitting: Physical Therapy

## 2015-12-06 DIAGNOSIS — I2510 Atherosclerotic heart disease of native coronary artery without angina pectoris: Secondary | ICD-10-CM

## 2015-12-06 MED ORDER — RIVAROXABAN 15 MG PO TABS: 15.0000 mg | ORAL_TABLET | Freq: Every day | ORAL | Status: DC

## 2015-12-12 ENCOUNTER — Ambulatory Visit (INDEPENDENT_AMBULATORY_CARE_PROVIDER_SITE_OTHER): Payer: PPO | Admitting: Student

## 2015-12-12 ENCOUNTER — Encounter: Payer: Self-pay | Admitting: Student

## 2015-12-12 VITALS — BP 104/55 | HR 84 | Temp 98.3°F | Ht 62.0 in | Wt 152.6 lb

## 2015-12-12 DIAGNOSIS — N183 Chronic kidney disease, stage 3 unspecified: Secondary | ICD-10-CM

## 2015-12-12 DIAGNOSIS — R109 Unspecified abdominal pain: Secondary | ICD-10-CM | POA: Insufficient documentation

## 2015-12-12 DIAGNOSIS — I5022 Chronic systolic (congestive) heart failure: Secondary | ICD-10-CM | POA: Diagnosis not present

## 2015-12-12 DIAGNOSIS — R600 Localized edema: Secondary | ICD-10-CM

## 2015-12-12 LAB — POCT URINALYSIS DIPSTICK
BILIRUBIN UA: NEGATIVE
GLUCOSE UA: NEGATIVE
Ketones, UA: NEGATIVE
Leukocytes, UA: NEGATIVE
Nitrite, UA: NEGATIVE
Protein, UA: NEGATIVE
SPEC GRAV UA: 1.01
Urobilinogen, UA: 0.2
pH, UA: 5.5

## 2015-12-12 LAB — POCT UA - MICROSCOPIC ONLY

## 2015-12-12 NOTE — Patient Instructions (Signed)
Follow-up in 2 weeks with primary care provider for abdominal pain Follow with cardiologist as soon as possible If you have questions or concerns call the office at [phone]

## 2015-12-12 NOTE — Addendum Note (Signed)
Addended by: [place] Pink on: 12/12/2015 04:48 PM   Modules accepted: Orders

## 2015-12-12 NOTE — Assessment & Plan Note (Signed)
Abdominal pain in setting of history of inguinal lymphadenopathy. No nights sweats or weight loss noted. However, stable weight in setting pf heart failure may be just due to fluid retention.  - Will evaluate further with CT abd pelvis - Repeat UA today - poc ua to rule out UTI

## 2015-12-12 NOTE — Assessment & Plan Note (Signed)
Bilateral leg edema chronic in nature. Improved by today's exam and was likely exacerbated by increased salt intake prior to swelling onset. Improved welling with no SOB making CHF exacerbation unlikeley - Patient to follow with cardiology

## 2015-12-12 NOTE — Progress Notes (Signed)
   Subjective:    Patient ID: [name] Elliott, male    DOB: 1928-05-01, 82 y.o.   MRN: [id number]   CC: Abdominal pain, leg swelling  HPI: 82 year old male presenting for abdominal pain  Abdominal pain - Noted that it was worsening over the last week - Denies dysuria or increased urinary frequency - Denies penile pain, constipation  - Denies night sweats, loss of appetite     leg swelling - Worsened over the weekend - Patient reports compliance with Lasix regimen - Denies shortness of breath - Reports diet consists with likely dinners, prior to onset of leg swelling patient does admit to eating several hotdogs - leg swelling improved prior to this visit  Review of Systems ROS  per history of present illness otherwise patient denies recent illnesses, fevers, nausea, vomiting, diarrhea, chest pain  Past Medical, Surgical, Social, and Family History Reviewed & Updated per EMR.   Objective:  BP 104/55 mmHg  Pulse 84  Temp(Src) 98.3 F (36.8 C) (Oral)  Ht 5\' 2"  (1.575 m)  Wt 152 lb 9.6 oz (69.219 kg)  BMI 27.90 kg/m2 Vitals and nursing note reviewed  General: NAD Cardiac: RRR Respiratory: CTAB, normal effort Abdomen : Suprapubic tenderness to palpation, no rebound or guarding  GU: Shotty inguinal lymphadenopathy noted, no inguinal tenderness  Extremities: 1-2+ bilateral pitting edema, mild tenderness to palpation over bilateral lower extremities, venous stasis skin changes noted bilaterally  Skin: As noted above as noted above    Assessment & Plan:    Abdominal pain Abdominal pain in setting of history of inguinal lymphadenopathy. No nights sweats or weight loss noted. However, stable weight in setting pf heart failure may be just due to fluid retention.  - Will evaluate further with CT abd pelvis - Repeat UA today - poc ua to rule out UTI  Bilateral leg edema Bilateral leg edema chronic in nature. Improved by today's exam and was likely exacerbated by increased  salt intake prior to swelling onset. Improved welling with no SOB making CHF exacerbation unlikeley - Patient to follow with cardiology     Meyli Boice A. Lincoln Brigham MD, [facility] Resident PGY-2 Pager [phone]

## 2015-12-13 ENCOUNTER — Telehealth: Payer: Self-pay | Admitting: *Deleted

## 2015-12-13 LAB — BASIC METABOLIC PANEL
BUN: 28 mg/dL — ABNORMAL HIGH (ref 7–25)
CHLORIDE: 96 mmol/L — AB (ref 98–110)
CO2: 25 mmol/L (ref 20–31)
Calcium: 9 mg/dL (ref 8.6–10.3)
Creat: 1.32 mg/dL — ABNORMAL HIGH (ref 0.70–1.11)
GLUCOSE: 133 mg/dL — AB (ref 65–99)
POTASSIUM: 3.9 mmol/L (ref 3.5–5.3)
SODIUM: 133 mmol/L — AB (ref 135–146)

## 2015-12-13 NOTE — Telephone Encounter (Signed)
Done

## 2015-12-13 NOTE — Telephone Encounter (Signed)
[name] with [facility] calls stating that patients CT abd/pelvis needs prior auth through his health team advantage.

## 2015-12-15 ENCOUNTER — Emergency Department (HOSPITAL_COMMUNITY)
Admission: EM | Admit: 2015-12-15 | Discharge: 2015-12-15 | Disposition: A | Discharge status: Home / Self Care | Payer: PPO | Attending: Emergency Medicine | Admitting: Emergency Medicine

## 2015-12-15 ENCOUNTER — Emergency Department (HOSPITAL_COMMUNITY): Payer: PPO

## 2015-12-15 ENCOUNTER — Encounter (HOSPITAL_COMMUNITY): Payer: Self-pay | Admitting: *Deleted

## 2015-12-15 ENCOUNTER — Ambulatory Visit (HOSPITAL_COMMUNITY): Payer: PPO

## 2015-12-15 DIAGNOSIS — Y9389 Activity, other specified: Secondary | ICD-10-CM | POA: Insufficient documentation

## 2015-12-15 DIAGNOSIS — S22060A Wedge compression fracture of T7-T8 vertebra, initial encounter for closed fracture: Secondary | ICD-10-CM | POA: Insufficient documentation

## 2015-12-15 DIAGNOSIS — Z87891 Personal history of nicotine dependence: Secondary | ICD-10-CM | POA: Diagnosis not present

## 2015-12-15 DIAGNOSIS — S22080A Wedge compression fracture of T11-T12 vertebra, initial encounter for closed fracture: Secondary | ICD-10-CM | POA: Insufficient documentation

## 2015-12-15 DIAGNOSIS — I2510 Atherosclerotic heart disease of native coronary artery without angina pectoris: Secondary | ICD-10-CM | POA: Insufficient documentation

## 2015-12-15 DIAGNOSIS — X58XXXA Exposure to other specified factors, initial encounter: Secondary | ICD-10-CM | POA: Insufficient documentation

## 2015-12-15 DIAGNOSIS — E7800 Pure hypercholesterolemia, unspecified: Secondary | ICD-10-CM | POA: Diagnosis not present

## 2015-12-15 DIAGNOSIS — IMO0002 Reserved for concepts with insufficient information to code with codable children: Secondary | ICD-10-CM

## 2015-12-15 DIAGNOSIS — Z8673 Personal history of transient ischemic attack (TIA), and cerebral infarction without residual deficits: Secondary | ICD-10-CM | POA: Insufficient documentation

## 2015-12-15 DIAGNOSIS — Z8701 Personal history of pneumonia (recurrent): Secondary | ICD-10-CM | POA: Diagnosis not present

## 2015-12-15 DIAGNOSIS — S22050A Wedge compression fracture of T5-T6 vertebra, initial encounter for closed fracture: Secondary | ICD-10-CM | POA: Diagnosis not present

## 2015-12-15 DIAGNOSIS — E119 Type 2 diabetes mellitus without complications: Secondary | ICD-10-CM | POA: Diagnosis not present

## 2015-12-15 DIAGNOSIS — I10 Essential (primary) hypertension: Secondary | ICD-10-CM | POA: Insufficient documentation

## 2015-12-15 DIAGNOSIS — R109 Unspecified abdominal pain: Secondary | ICD-10-CM | POA: Diagnosis not present

## 2015-12-15 DIAGNOSIS — Y998 Other external cause status: Secondary | ICD-10-CM | POA: Diagnosis not present

## 2015-12-15 DIAGNOSIS — R1030 Lower abdominal pain, unspecified: Secondary | ICD-10-CM | POA: Diagnosis not present

## 2015-12-15 DIAGNOSIS — G8929 Other chronic pain: Secondary | ICD-10-CM | POA: Diagnosis not present

## 2015-12-15 DIAGNOSIS — S22040A Wedge compression fracture of fourth thoracic vertebra, initial encounter for closed fracture: Secondary | ICD-10-CM | POA: Diagnosis not present

## 2015-12-15 DIAGNOSIS — M545 Low back pain: Secondary | ICD-10-CM | POA: Insufficient documentation

## 2015-12-15 DIAGNOSIS — M549 Dorsalgia, unspecified: Secondary | ICD-10-CM

## 2015-12-15 DIAGNOSIS — Z79899 Other long term (current) drug therapy: Secondary | ICD-10-CM | POA: Diagnosis not present

## 2015-12-15 DIAGNOSIS — S22030A Wedge compression fracture of third thoracic vertebra, initial encounter for closed fracture: Secondary | ICD-10-CM | POA: Diagnosis not present

## 2015-12-15 DIAGNOSIS — Z7982 Long term (current) use of aspirin: Secondary | ICD-10-CM | POA: Insufficient documentation

## 2015-12-15 DIAGNOSIS — E039 Hypothyroidism, unspecified: Secondary | ICD-10-CM | POA: Diagnosis not present

## 2015-12-15 DIAGNOSIS — Y9289 Other specified places as the place of occurrence of the external cause: Secondary | ICD-10-CM | POA: Insufficient documentation

## 2015-12-15 DIAGNOSIS — Z86718 Personal history of other venous thrombosis and embolism: Secondary | ICD-10-CM | POA: Insufficient documentation

## 2015-12-15 DIAGNOSIS — R1084 Generalized abdominal pain: Secondary | ICD-10-CM | POA: Diagnosis not present

## 2015-12-15 LAB — CBC WITH DIFFERENTIAL/PLATELET
Basophils Absolute: 0 10*3/uL (ref 0.0–0.1)
Basophils Relative: 0 %
Eosinophils Absolute: 0 10*3/uL (ref 0.0–0.7)
Eosinophils Relative: 0 %
HEMATOCRIT: 32.6 % — AB (ref 39.0–52.0)
HEMOGLOBIN: 10.5 g/dL — AB (ref 13.0–17.0)
LYMPHS ABS: 1.2 10*3/uL (ref 0.7–4.0)
LYMPHS PCT: 12 %
MCH: 24.9 pg — AB (ref 26.0–34.0)
MCHC: 32.2 g/dL (ref 30.0–36.0)
MCV: 77.4 fL — AB (ref 78.0–100.0)
MONO ABS: 1 10*3/uL (ref 0.1–1.0)
MONOS PCT: 9 %
NEUTROS ABS: 8.4 10*3/uL — AB (ref 1.7–7.7)
NEUTROS PCT: 79 %
Platelets: 265 10*3/uL (ref 150–400)
RBC: 4.21 MIL/uL — ABNORMAL LOW (ref 4.22–5.81)
RDW: 19.4 % — AB (ref 11.5–15.5)
WBC: 10.7 10*3/uL — ABNORMAL HIGH (ref 4.0–10.5)

## 2015-12-15 LAB — URINALYSIS, ROUTINE W REFLEX MICROSCOPIC
BILIRUBIN URINE: NEGATIVE
Glucose, UA: NEGATIVE mg/dL
KETONES UR: NEGATIVE mg/dL
Leukocytes, UA: NEGATIVE
NITRITE: NEGATIVE
PROTEIN: NEGATIVE mg/dL
SPECIFIC GRAVITY, URINE: 1.012 (ref 1.005–1.030)
pH: 5.5 (ref 5.0–8.0)

## 2015-12-15 LAB — URINE MICROSCOPIC-ADD ON: RBC / HPF: NONE SEEN RBC/hpf (ref 0–5)

## 2015-12-15 LAB — CK: Total CK: 74 U/L (ref 49–397)

## 2015-12-15 LAB — BASIC METABOLIC PANEL
Anion gap: 14 (ref 5–15)
BUN: 33 mg/dL — ABNORMAL HIGH (ref 6–20)
CALCIUM: 8.5 mg/dL — AB (ref 8.9–10.3)
CHLORIDE: 96 mmol/L — AB (ref 101–111)
CO2: 23 mmol/L (ref 22–32)
CREATININE: 1.61 mg/dL — AB (ref 0.61–1.24)
GFR calc Af Amer: 43 mL/min — ABNORMAL LOW (ref >60)
GFR, EST NON AFRICAN AMERICAN: 37 mL/min — AB (ref >60)
Glucose, Bld: 152 mg/dL — ABNORMAL HIGH (ref 65–99)
Potassium: 3.8 mmol/L (ref 3.5–5.1)
Sodium: 133 mmol/L — ABNORMAL LOW (ref 135–145)

## 2015-12-15 MED ORDER — HYDROCODONE-ACETAMINOPHEN 5-325 MG PO TABS
1.0000 | ORAL_TABLET | Freq: Once | ORAL | Status: AC
  Administered 2015-12-15: 1 via ORAL
  Filled 2015-12-15: qty 1

## 2015-12-15 MED ORDER — SODIUM CHLORIDE 0.9 % IV BOLUS (SEPSIS)
1000.0000 mL | Freq: Once | INTRAVENOUS | Status: AC
  Administered 2015-12-15: 1000 mL via INTRAVENOUS

## 2015-12-15 MED ORDER — IOPAMIDOL (ISOVUE-300) INJECTION 61%
INTRAVENOUS | Status: AC
  Administered 2015-12-15: 80 mL
  Filled 2015-12-15: qty 100

## 2015-12-15 MED ORDER — HYDROCODONE-ACETAMINOPHEN 5-325 MG PO TABS: 1.0000 | ORAL_TABLET | ORAL | Status: DC | PRN

## 2015-12-15 NOTE — Discharge Planning (Signed)
ED CM consulted regarding discharge planning for pt (home health).  ED CM went to pt room to speak with pt and POA.  Pt was OTF; ED CM spoke with POA and explained that there is a $25 co pay for home health services per visit, per discipline.  POA states pt will not want to pay for services, but would like to have someone come back to talk to him when he returns.  ED CM will continue to monitor.

## 2015-12-15 NOTE — ED Notes (Signed)
Pt arrives from home via [facility]. Pt states he has generalized chronic pain, but for the last 2 weeks has had shooting sharp pain in his inguinal region with movement made worse with walking. Pt states he has also developed a tremor in his arms. Pt is in NAD upon arrival with no tremor noted at rest. Pt does have a mild tremor in his arms bilaterally with movement.

## 2015-12-15 NOTE — ED Provider Notes (Signed)
CSN: [id number]     Arrival date & time 12/15/15  1201 History   First MD Initiated Contact with Patient 12/15/15 1259     Chief Complaint  Patient presents with  . Groin Pain  . Abdominal Pain     (Consider location/radiation/quality/duration/timing/severity/associated sxs/prior Treatment) HPI Comments: Pt comes via EMS with cc of pain. Per daughter, who has a POa, her father lives by himself. Apparently there was a family member visiting, and they noticed patient shaking, and so EMS was called. Pt reports that he was doing that because he was in pain and remembers shaking himself. Pt reports that he has NO NEW PAIN. He has pain is his back, and his lower abd and groin area. No uti like symptoms. No recent falls. Pt admits to having poor mobility, but states that he is able to take care for himself when he needs to. Pt denies any n/v/f/c, chest pain, dib. Denies any new numbness, tingling in the legs.  Patient is a 82 y.o. male presenting with groin pain and abdominal pain. The history is provided by the patient.  Groin Pain Associated symptoms include abdominal pain.  Abdominal Pain   Past Medical History  Diagnosis Date  . Constipation, chronic   . Sebaceous cyst   . Other malaise and fatigue   . Pain in joint, shoulder region     Has chronic shoulder pain  . Persistent disorder of initiating or maintaining sleep   . Thrombocytopenia, unspecified ([place])   . Vascular dementia without behavioral disturbance     with periods of amnesia  . Other B-complex deficiencies   . Pain in limb   . Esophageal reflux   . Unspecified essential hypertension   . Arthropathy, unspecified, site unspecified   . Pure hypercholesterolemia   . Orthostasis   . PVC's (premature ventricular contractions)   . S/P cardiac cath 08/08/11    mild to moderate CAD primarily in the LAD. None are obstructive and appear stable from prior cath in 2007; managed medically  . Arthritis   . CAD (coronary artery  disease)     last cath in 2012. Managed medically-some blockages  . Headache(784.0)   . 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 MRI Nov '11 - tendonosis and partial tear. Left shoulder surgery Jan '12 for partial shoulder replacement. [name])   . Pneumonia 1980's?; 2011  . History of stomach ulcers 11/2012  . Chronic lower back pain   . Atrial fibrillation ([place])   . DVT (deep venous thrombosis) ([place])   . Stroke ([place])   . Dysrhythmia   . Hypothyroidism     pt states he doeen't have   . Diabetes mellitus without complication ([place])     pt states he doesn't have  . Blood dyscrasia     thromboctopenia pt family states he was never told of this  . Primary localized osteoarthritis of left knee 07/12/2015   Past Surgical History  Procedure Laterality Date  . Hand surgery Right     crush injury, right fifth digit contracture, limited  motion  . Lumbar spine surgery  2007    Dr Philip Aspen ([place])  . Knee surgery Left 1991    "did it twice in 1 wk" (02/17/2013)  . Cataract extraction Left 2009  . Shoulder open rotator cuff repair Left 8.30.2011  . US echocardiography  02-28-2010    Est EF 50-55%  . Cardiac catheterization  08/2011  . Shoulder hemi-arthroplasty    . Finger  amputation Right     pinky finger  . Hardware removal  02/05/2012    Procedure: HARDWARE REMOVAL;  Surgeon: Johnny Bridge, MD;  Location: [facility];  Service: Orthopedics;  Laterality: Left;  . Reverse shoulder arthroplasty  02/05/2012    Procedure: REVERSE SHOULDER ARTHROPLASTY;  Surgeon: Johnny Bridge, MD;  Location: [facility];  Service: Orthopedics;  Laterality: Left;  . Back surgery    . Esophagogastroduodenoscopy N/A 11/19/2012    Procedure: ESOPHAGOGASTRODUODENOSCOPY (EGD);  Surgeon: Jerene Bears, MD;  Location: [facility];  Service: Gastroenterology;  Laterality: N/A;  . Colonoscopy    . Total knee arthroplasty Left 07/12/2015    Procedure: TOTAL KNEE ARTHROPLASTY;  Surgeon: Marchia Bond, MD;  Location: [facility];  Service: Orthopedics;  Laterality: Left;   Family History  Problem Relation Age of Onset  . Breast cancer Mother   . Cancer Mother   . Diabetes Mother   . Cancer Brother     throat  . Stroke Brother   . Diabetes Daughter   . Colon cancer Neg Hx   . Heart attack Neg Hx   . Kidney disease Father   . Diabetes Sister    Social History  Substance Use Topics  . Smoking status: Former Smoker    Types: Cigars    Quit date: 09/03/1978  . Smokeless tobacco: Current User    Types: Chew     Comment: 02/17/2013 "I aien't smoked a cigar in 20-30 yr"  . Alcohol Use: No    Review of Systems  Gastrointestinal: Positive for abdominal pain.  All other systems reviewed and are negative.     Allergies  Crestor  Home Medications   Prior to Admission medications   Medication Sig Start Date End Date Taking? Authorizing Provider  aspirin EC 81 MG tablet Take 81 mg by mouth daily.    Yes Historical Provider, MD  baclofen (LIORESAL) 10 MG tablet Take 1 tablet (10 mg total) by mouth 3 (three) times daily. As needed for muscle spasm 07/12/15  Yes Marchia Bond, MD  furosemide (LASIX) 40 MG tablet Take 1 tablet (40 mg total) by mouth daily. 11/23/15  Yes Leone Brand, MD  gabapentin (NEURONTIN) 300 MG capsule Take 2 capsules (600 mg total) by mouth 3 (three) times daily. 11/16/15  Yes Lupita Dawn, MD  isosorbide mononitrate (IMDUR) 60 MG 24 hr tablet Take 1 tablet (60 mg total) by mouth every morning. 06/13/15  Yes Thayer Headings, MD  magnesium hydroxide (MILK OF MAGNESIA) 400 MG/5ML suspension Take 15 mLs by mouth daily as needed for mild constipation or moderate constipation.   Yes Historical Provider, MD  meclizine (ANTIVERT) 25 MG tablet Take 1 tablet (25 mg total) by mouth 3 (three) times daily as needed for dizziness. 09/27/15  Yes Leone Brand, MD  omeprazole (PRILOSEC) 20 MG capsule Take 1 capsule (20 mg total) by mouth 2 (two) times daily before a meal.  11/23/15  Yes Leone Brand, MD  ondansetron (ZOFRAN) 4 MG tablet Take 1 tablet (4 mg total) by mouth every 8 (eight) hours as needed for nausea or vomiting. 07/12/15  Yes Marchia Bond, MD  potassium chloride SA (K-DUR,KLOR-CON) 20 MEQ tablet Take 1 tablet (20 mEq total) by mouth 2 (two) times daily. 11/10/15  Yes Lupita Dawn, MD  Rivaroxaban (XARELTO) 15 MG TABS tablet Take 1 tablet (15 mg total) by mouth daily. 12/06/15  Yes Thayer Headings, MD  sennosides-docusate sodium (SENOKOT-S) 8.6-50 MG  tablet Take 2 tablets by mouth daily. 07/12/15  Yes Marchia Bond, MD   BP 126/71 mmHg  Pulse 79  Temp(Src) 98 F (36.7 C) (Oral)  SpO2 97% Physical Exam  Constitutional: He is oriented to person, place, and time. He appears well-developed.  HENT:  Head: Normocephalic and atraumatic.  Eyes: Conjunctivae and EOM are normal. Pupils are equal, round, and reactive to light.  Neck: Normal range of motion. Neck supple.  Cardiovascular: Normal rate and regular rhythm.   Pulmonary/Chest: Effort normal and breath sounds normal.  Abdominal: Soft. Bowel sounds are normal. He exhibits no distension. There is no tenderness. There is no rebound and no guarding.  Neurological: He is alert and oriented to person, place, and time.  Skin: Skin is warm.  Nursing note and vitals reviewed.   ED Course  Procedures (including critical care time) Labs Review Labs Reviewed  CBC WITH DIFFERENTIAL/PLATELET - Abnormal; Notable for the following:    WBC 10.7 (*)    RBC 4.21 (*)    Hemoglobin 10.5 (*)    HCT 32.6 (*)    MCV 77.4 (*)    MCH 24.9 (*)    RDW 19.4 (*)    Neutro Abs 8.4 (*)    All other components within normal limits  BASIC METABOLIC PANEL - Abnormal; Notable for the following:    Sodium 133 (*)    Chloride 96 (*)    Glucose, Bld 152 (*)    BUN 33 (*)    Creatinine, Ser 1.61 (*)    Calcium 8.5 (*)    GFR calc non Af Amer 37 (*)    GFR calc Af Amer 43 (*)    All other components within normal  limits  URINALYSIS, ROUTINE W REFLEX MICROSCOPIC (NOT AT [facility])    Imaging Review Dg Thoracic Spine 4v  12/15/2015  CLINICAL DATA:  Back pain which is generalized and chronic per report EXAM: THORACIC SPINE - 4+ VIEW COMPARISON:  09/24/2015 chest x-ray FINDINGS: Compression fractures at T3, T4, T7, and T11 at least, with unchanged appearance since comparison chest x-ray. No definite acute fracture is seen. Height loss is worst at T11, greater than 75%. No evidence of endplate erosion or focal bone lesion. Diffuse degenerative disc disease. Marked osteopenia. IMPRESSION: Multiple compression fractures that are chronic based on prior imaging. No acute fracture identified. Electronically Signed   By: Monte Fantasia M.D.   On: 12/15/2015 14:56   I have personally reviewed and evaluated these images and lab results as part of my medical decision-making.   EKG Interpretation None      MDM   Final diagnoses:  Back pain    Pt comes in with cc of back pain and groin pain. Pt has no new pain, but his pain is worse than usual. Pt saw PCP, CT abdomen ordered. On exam, pt has inguinal tenderness, L side, lower abd pain, back pain. No red flags for cord compression.  Pt is able to ambulate, with pain. Lives by himself. Daughter, who also has POA, at bedside. She is upset that we are unable to figure out the cause of the pain. Informed her that we will get back xrays to ensure there is no new compression fractures, but no further imaging is needed, and the inguinal area has no gross deformity or signs of infection. We will get CT as a courtesy, especially since it will be difficult for patient to get there and it will cause even more pain. Pt has made  it abundantly clear that he doesn't want to go to a SNF. He understands that if he falls, he will hurt him self, but he is not ready to go to SNF. Informed the family to be prepared for SNF if patient gets worse. Consulted Casemanagement to se if they can  discuss home health, POA told me however that if insurance doesn't cover the visit fully, they are not interested in any help right now. My partner will f/u on the CT, if neg, pt to be discharged home.     Varney Biles, MD 12/17/15 [phone]

## 2015-12-15 NOTE — Discharge Instructions (Signed)
We saw you in the ER for THE PAIN. All the results in the ER are normal, labs and imaging. You have compression fracture on the thoracic spine - but they are not new. CT scan results are pending at this time, and my partner would ensure that it's not showing any thing that needs admission if you were to be discharged.  We are not sure what is causing your symptoms. The workup in the ER is not complete, and is limited to screening for life threatening and emergent conditions only, so please see a primary care doctor for further evaluation.  Please consider home health if your symptoms get worse. Our Care manager offered you the options you have available at this time.  Vertebral Fracture A vertebral fracture is a break in one of the bones that make up the spine (vertebrae). The vertebrae are stacked on top of each other to form the spinal column. They support the body and protect the spinal cord. The vertebral column has an upper part (cervical spine), a middle part (thoracic spine), and a lower part (lumbar spine). Most vertebral fractures occur in the thoracic spine or lumbar spine. There are three main types of vertebral fractures:  Flexion fracture. This happens when vertebrae collapse. Vertebrae can collapse:  In the front (compression fracture). This type of fracture is common in people who have a condition that causes their bones to be weak and brittle (osteoporosis). The fracture can make a person lose height.  In the front and back (axial burst fracture).  Extension fracture. This happens when an external force pulls apart the vertebrae.  Rotation fracture. This happens when the spine bends extremely in one direction. This type can cause a piece of a vertebra to break off (transverse process fracture) or move out of its normal position (fracture dislocation). This type of fracture has a high risk for spinal cord injury. Vertebral fractures can range from mild to very severe. The most  severe types are those that cause the broken bones to move out of place (unstable) and those that injure or press on the spinal cord. CAUSES This condition is usually caused by a forceful injury. This type of injury commonly results from:  Car accidents.  Falling or jumping from a great height.  Collisions in contact sports.  Violent acts, such as an assault or a gunshot wound. RISK FACTORS This injury is more likely to happen to people who:  Have osteoporosis.  Participate in contact sports.  Are in situations that could result in falls or other violent injuries. SYMPTOMS Symptoms of this injury depend on the location and the type of fracture. The most common symptom is back pain that gets worse with movement. You may also have trouble standing or walking. If a fracture has damaged your spinal cord or is pressing on it, you may also have:  Numbness.  Tingling.  Weakness.  Loss of movement.  Loss of bowel or bladder control. DIAGNOSIS This injury may be diagnosed based on symptoms, medical history, and a physical exam. You may also have imaging tests to confirm the diagnosis. These may include:  Spine X-ray.  CT scan.  MRI. TREATMENT Treatment for this injury depends on the type of fracture. If your fracture is stable and does not affect your spinal cord, it may heal with nonsurgical treatment, such as:  Taking pain medicine.  Wearing a cast or a brace.  Doing physical therapy exercises. If your vertebral fracture is unstable or it affects your  spinal cord, you may need surgical treatment, such as:  Laminectomy. This procedure involves removing the part of a vertebra that is pushing on the spinal cord (spinal decompression surgery). Bone fragments may also be removed.  Spinal fusion. This procedure is used to stabilize an unstable fracture. Vertebrae may be joined together with a piece of bone from another part of your body (graft) and held in place with rods,  plates, or screws.  Vertebroplasty. In this procedure, bone cement is used to rebuild collapsed vertebrae. HOME CARE INSTRUCTIONS General Instructions  Take medicines only as directed by your health care provider.  Do not drive or operate heavy machinery while taking pain medicine.  If directed, apply ice to the injured area:  Put ice in a plastic bag.  Place a towel between your skin and the bag.  Leave the ice on for 30 minutes every two hours at first. Then apply the ice as needed.  Wear your neck brace or back brace as directed by your health care provider.  Do not drink alcohol. Alcohol can interfere with your treatment.  Keep all follow-up visits as directed by your health care provider. This is important. It can help to prevent permanent injury, disability, and long-lasting (chronic) pain. Activity  Stay in bed (on bed rest) only as directed by your health care provider. Being on bed rest for too long can make your condition worse.  Return to your normal activities as directed by your health care provider. Ask what activities are safe for you.  Do exercises to improve motion and strength in your back (physical therapy), as recommended by your health care provider.   Exercise regularly as directed by your health care provider. SEEK MEDICAL CARE IF:  You have a fever.  You develop a cough that makes your pain worse.  Your pain medicine is not helping.  Your pain does not get better over time.  You cannot return to your normal activities as planned or expected. SEEK IMMEDIATE MEDICAL CARE IF:  Your pain is very bad and it suddenly gets worse.  You are unable to move any body part (paralysis) that is below the level of your injury.  You have numbness, tingling, or weakness in any body part that is below the level of your injury.  You cannot control your bladder or bowels.   This information is not intended to replace advice given to you by your health care  provider. Make sure you discuss any questions you have with your health care provider.   Document Released: 09/27/2004 Document Revised: 01/04/2015 Document Reviewed: 08/25/2014 Elsevier Interactive Patient Education 2016 Elsevier Inc. Chronic Back Pain  When back pain lasts longer than 3 months, it is called chronic back pain.People with chronic back pain often go through certain periods that are more intense (flare-ups).  CAUSES Chronic back pain can be caused by wear and tear (degeneration) on different structures in your back. These structures include:  The bones of your spine (vertebrae) and the joints surrounding your spinal cord and nerve roots (facets).  The strong, fibrous tissues that connect your vertebrae (ligaments). Degeneration of these structures may result in pressure on your nerves. This can lead to constant pain. HOME CARE INSTRUCTIONS  Avoid bending, heavy lifting, prolonged sitting, and activities which make the problem worse.  Take brief periods of rest throughout the day to reduce your pain. Lying down or standing usually is better than sitting while you are resting.  Take over-the-counter or prescription medicines only  as directed by your caregiver. SEEK IMMEDIATE MEDICAL CARE IF:   You have weakness or numbness in one of your legs or feet.  You have trouble controlling your bladder or bowels.  You have nausea, vomiting, abdominal pain, shortness of breath, or fainting.   This information is not intended to replace advice given to you by your health care provider. Make sure you discuss any questions you have with your health care provider.   Document Released: 09/27/2004 Document Revised: 11/12/2011 Document Reviewed: 02/07/2015 Elsevier Interactive Patient Education [employer].

## 2015-12-15 NOTE — ED Provider Notes (Signed)
Results of CT reviewed with the patient and his daughter as per plan with Dr. Kathrynn Humble.  No UTI present and patient is putting out urine well. Patient will be discharged as per plan with continued outpatient follow-up and management.  [name] Shanks, MD 12/15/15 2037

## 2015-12-15 NOTE — ED Notes (Signed)
Called CT, they state pt is due at 42.

## 2015-12-15 NOTE — ED Notes (Signed)
Explained to pt we are waiting on CT and need to establish an IV. Pt family member began huffing and rolling her eyes.

## 2015-12-16 ENCOUNTER — Ambulatory Visit (HOSPITAL_COMMUNITY): Admission: RE | Admit: 2015-12-16 | Payer: PPO | Source: Ambulatory Visit

## 2015-12-16 ENCOUNTER — Other Ambulatory Visit: Payer: Self-pay | Admitting: *Deleted

## 2015-12-16 NOTE — Patient Outreach (Signed)
[place] [facility]) Care Management [place] Telephone Outreach 12/16/2015  Emerson Rallo December 27, 1927 [id number]  Successful telephone outreach to [name], daughter and [name] for patient [name] Elliott, on referral from ED nurse secondary to patient/ family request for home health care after recent ED visit on Thursday December 15, 2015.  HIPPA verified during conversation; explained [facility] CM services to Ms. Joneen Caraway, who reports that she will discuss with Mr. Sokalski to determine their interest in participating.  Explained to Ms. Joneen Caraway that I would need verbal consent to participate in Mr. Forestier care, followed by written consent, which could be obtained during an in-home visit at a later time.  Ms. Joneen Caraway stated that she would consider [facility] services, and requested that I call her back next week to discuss.  Plan: Telephone outreach next week to Mr. Rummell and/ or Ms. Joneen Caraway.  Oneta Rack, RN, BSN, [employer] [facility] Care Management  [phone]

## 2015-12-20 ENCOUNTER — Other Ambulatory Visit: Payer: Self-pay | Admitting: *Deleted

## 2015-12-20 NOTE — Patient Outreach (Signed)
[place] [facility]) Care Management  12/20/2015  Lanney Carrithers [birth year] [id number]  Successful telephone outreach to [name], daughter and [name] for patient Jamor Ajayi, on referral from ED nurse secondary to patient/ family request for home health care after recent ED visit on Thursday December 15, 2015.  Ms. Joneen Caraway had asked that I call her this week after our initial conversation last week to explain [facility] services.  HIPPA verified during conversation;  Ms. Joneen Caraway reported that she had not had time to discuss with Mr. Meek to determine their interest in participating with [place]. Re-explained to Ms. Joneen Caraway that I would need verbal consent to participate in Mr. Fran care, followed by written consent, which could be obtained during an in-home visit at a later time. Ms. Joneen Caraway again stated that she would discuss with Mr. Douget and would consider [facility] services, and requested that I call her back later this week to discuss.  I again explained [facility] CM services to Ms. Joneen Caraway, who voiced understanding.  Plan: Telephone outreach later this week to Mr. Lafler and/ or Ms. Joneen Caraway.  Oneta Rack, RN, BSN, [employer] [facility] Care Management  [phone]

## 2015-12-23 ENCOUNTER — Encounter: Payer: Self-pay | Admitting: *Deleted

## 2015-12-23 ENCOUNTER — Other Ambulatory Visit: Payer: Self-pay | Admitting: *Deleted

## 2015-12-23 NOTE — Patient Outreach (Signed)
[place] [facility]) Care Management  12/23/2015  [name] Elliott October 01, 1927 [id number]  Successful telephone outreach to [name], daughter and [name] for patient [name] Elliott, on referral from ED nurse secondary to patient/ family request for home health care after recent ED visit on Thursday December 15, 2015. Ms. Joneen Caraway had asked that I call her later this week after our 2 previous conversations about [facility] services.  HIPPA verified during conversation; Ms. Joneen Caraway reported that she had time this week to discuss with [name], whom she states does not wish to have anyone coming to his home at this time.  I explained that we could follow by telephone outreach, and also explained that there were no charges for [facility] services, again explaining [facility] CM services to Ms. Joneen Caraway, who voiced understanding of all.  Ms. Joneen Caraway continually maintained that [name] did not wish to engage with [facility] CM services at this time.  I explained that I would put a letter in the mail to him, as well as to [name] PCP, and encouraged her to please contact us in the future should he change his mind, which she agreed to do.   Plan: Will send letter to patient and to his PCP to inform that patient declined [facility] services at this time.  Oneta Rack, RN, BSN, [employer] [facility] Care Management  [phone]

## 2015-12-29 DIAGNOSIS — M5136 Other intervertebral disc degeneration, lumbar region: Secondary | ICD-10-CM | POA: Diagnosis not present

## 2016-01-04 DIAGNOSIS — M5136 Other intervertebral disc degeneration, lumbar region: Secondary | ICD-10-CM | POA: Diagnosis not present

## 2016-01-05 DIAGNOSIS — M5136 Other intervertebral disc degeneration, lumbar region: Secondary | ICD-10-CM | POA: Diagnosis not present

## 2016-01-05 DIAGNOSIS — M5126 Other intervertebral disc displacement, lumbar region: Secondary | ICD-10-CM | POA: Diagnosis not present

## 2016-01-11 DIAGNOSIS — M5136 Other intervertebral disc degeneration, lumbar region: Secondary | ICD-10-CM | POA: Diagnosis not present

## 2016-02-06 ENCOUNTER — Ambulatory Visit: Payer: PPO | Admitting: Family Medicine

## 2016-02-06 ENCOUNTER — Ambulatory Visit (INDEPENDENT_AMBULATORY_CARE_PROVIDER_SITE_OTHER): Payer: PPO | Admitting: Family Medicine

## 2016-02-06 ENCOUNTER — Encounter: Payer: Self-pay | Admitting: Family Medicine

## 2016-02-06 VITALS — BP 120/64 | HR 58 | Temp 98.0°F | Wt 147.4 lb

## 2016-02-06 DIAGNOSIS — F0150 Vascular dementia without behavioral disturbance: Secondary | ICD-10-CM

## 2016-02-06 NOTE — Progress Notes (Signed)
   Subjective:    Patient ID: [name] Elliott, male    DOB: 06-19-1928, 82 y.o.   MRN: [id number]  HPI  CC: DMV forms  # DMV forms:  Had forms filled out by Dr. Myles Rosenthal last year, however did not fill out section that would not require him to get yearly exams. Dr. Myles Rosenthal has apparently since passed away. They think Dr. Myles Rosenthal is a family/regular doctor  Patient lives by himself and tends to drive short distances  States passed driving test in March  States completed through 3rd grade, but reports being illiterate  Denies any confusion with driving, at home. He does live alone.   Denies any current limitations or pains with his legs (previously PCP was concerned about leg function impairing driving ability)  Social Hx: former smoker  Review of Systems   See HPI for ROS.   Past medical history, surgical, family, and social history reviewed and updated in the EMR as appropriate. Objective:  BP 120/64 mmHg  Pulse 58  Temp(Src) 98 F (36.7 C) (Oral)  Wt 147 lb 6.4 oz (66.86 kg)  SpO2 98% Vitals and nursing note reviewed  General: no apparent distress  Neuro: alert, oriented to person/place/year. MOCA score 12/30, see below. He is able to ambulate without assistance.     Assessment & Plan:  Vascular dementia without behavioral disturbance MOCA test score 12/30 -- however he actually should likely need to have the MOCA-Basic test performed given that he had only gotten to grade 3. MMSE last year was 26/30. Given the score here I discussed that I would not fill out the [facility] paperwork today, but requested they return to the clinic to get the [facility] testing done as we did not have additional time to do this. Paperwork deadline is 6/15, so they have >1 week to reschedule, discussed if I was unavailable they could get in to be seen by another doctor.     Return in about 1 week (around 02/13/2016).

## 2016-02-07 NOTE — Assessment & Plan Note (Signed)
MOCA test score 12/30 -- however he actually should likely need to have the MOCA-Basic test performed given that he had only gotten to grade 3. MMSE last year was 26/30. Given the score here I discussed that I would not fill out the [facility] paperwork today, but requested they return to the clinic to get the [facility] testing done as we did not have additional time to do this. Paperwork deadline is 6/15, so they have >1 week to reschedule, discussed if I was unavailable they could get in to be seen by another doctor.

## 2016-02-08 DIAGNOSIS — K219 Gastro-esophageal reflux disease without esophagitis: Secondary | ICD-10-CM | POA: Diagnosis not present

## 2016-02-08 DIAGNOSIS — I519 Heart disease, unspecified: Secondary | ICD-10-CM | POA: Diagnosis not present

## 2016-02-14 ENCOUNTER — Ambulatory Visit (INDEPENDENT_AMBULATORY_CARE_PROVIDER_SITE_OTHER): Payer: PPO | Admitting: Family Medicine

## 2016-02-14 ENCOUNTER — Encounter: Payer: Self-pay | Admitting: Family Medicine

## 2016-02-14 VITALS — BP 113/58 | HR 77 | Temp 97.7°F | Ht 62.0 in | Wt 147.8 lb

## 2016-02-14 DIAGNOSIS — K1321 Leukoplakia of oral mucosa, including tongue: Secondary | ICD-10-CM | POA: Diagnosis not present

## 2016-02-14 DIAGNOSIS — R599 Enlarged lymph nodes, unspecified: Secondary | ICD-10-CM

## 2016-02-14 DIAGNOSIS — R6884 Jaw pain: Secondary | ICD-10-CM | POA: Diagnosis not present

## 2016-02-14 DIAGNOSIS — R634 Abnormal weight loss: Secondary | ICD-10-CM

## 2016-02-14 DIAGNOSIS — R591 Generalized enlarged lymph nodes: Secondary | ICD-10-CM

## 2016-02-14 LAB — COMPLETE METABOLIC PANEL WITH GFR
ALT: 7 U/L — AB (ref 9–46)
AST: 16 U/L (ref 10–35)
Albumin: 3.8 g/dL (ref 3.6–5.1)
Alkaline Phosphatase: 63 U/L (ref 40–115)
BUN: 25 mg/dL (ref 7–25)
CALCIUM: 8.9 mg/dL (ref 8.6–10.3)
CHLORIDE: 100 mmol/L (ref 98–110)
CO2: 21 mmol/L (ref 20–31)
CREATININE: 1.59 mg/dL — AB (ref 0.70–1.11)
GFR, Est African American: 45 mL/min — ABNORMAL LOW (ref >=60)
GFR, Est Non African American: 39 mL/min — ABNORMAL LOW (ref >=60)
GLUCOSE: 155 mg/dL — AB (ref 65–99)
Potassium: 4 mmol/L (ref 3.5–5.3)
SODIUM: 133 mmol/L — AB (ref 135–146)
Total Bilirubin: 0.8 mg/dL (ref 0.2–1.2)
Total Protein: 6.8 g/dL (ref 6.1–8.1)

## 2016-02-14 LAB — CBC WITH DIFFERENTIAL/PLATELET
Basophils Absolute: 80 cells/uL (ref 0–200)
Basophils Relative: 1 %
Eosinophils Absolute: 480 cells/uL (ref 15–500)
Eosinophils Relative: 6 %
HEMATOCRIT: 34.1 % — AB (ref 38.5–50.0)
HEMOGLOBIN: 10.8 g/dL — AB (ref 13.2–17.1)
LYMPHS PCT: 30 %
Lymphs Abs: 2400 cells/uL (ref 850–3900)
MCH: 25.8 pg — ABNORMAL LOW (ref 27.0–33.0)
MCHC: 31.7 g/dL — AB (ref 32.0–36.0)
MCV: 81.6 fL (ref 80.0–100.0)
MONO ABS: 640 {cells}/uL (ref 200–950)
MPV: 11 fL (ref 7.5–12.5)
Monocytes Relative: 8 %
NEUTROS PCT: 55 %
Neutro Abs: 4400 cells/uL (ref 1500–7800)
Platelets: 268 10*3/uL (ref 140–400)
RBC: 4.18 MIL/uL — ABNORMAL LOW (ref 4.20–5.80)
RDW: 17.3 % — AB (ref 11.0–15.0)
WBC: 8 10*3/uL (ref 3.8–10.8)

## 2016-02-14 LAB — TSH: TSH: 4.01 mIU/L (ref 0.40–4.50)

## 2016-02-14 MED ORDER — TRAMADOL HCL 50 MG PO TABS: 50.0000 mg | ORAL_TABLET | Freq: Three times a day (TID) | ORAL | Status: DC | PRN

## 2016-02-14 NOTE — Patient Instructions (Signed)
Nice to meet you today. We are getting an appointment with the ear nose and throat doctor. It is important to go to this appointment to figure out what may be going wrong.  I'm getting some lab work today and someone will call you or send you a letter with the results when they're available.  Please make a follow-up appointment with our office in about 4 weeks with your new primary care doctor, Dr. Dallas Schimke.  Take care, Dr. Jacinto Reap

## 2016-02-14 NOTE — Progress Notes (Signed)
   Subjective:   [name] Elliott is a 82 y.o. male with a history of HTN, CAD, TIA, DVT, combined heart failure, T2 DM here for same day appointment for jaw pain  History provided by patient and his daughter. They report that the inside of his mouth and his left jawline have been hurting for about a week. He is not sure if there've been any new bumps, in his mouth over this time course. He reports his throat is also a little sore. His daughter was concerned because his brother has a history of throat cancer. He has had a 4 pound weight loss over the last 2 months. He reports intermittent night sweats. He denies any change in diet or appetite, known fevers, other URI symptoms. His daughter reports that he uses chewing tobacco all day long for as long as she can remember.  He wears dentures and does not regularly see a dentist.  Review of Systems:  Per HPI.   Social History: former smoker  Objective:  BP 113/58 mmHg  Pulse 77  Temp(Src) 97.7 F (36.5 C) (Oral)  Ht 5\' 2"  (1.575 m)  Wt 147 lb 12.8 oz (67.042 kg)  BMI 27.03 kg/m2  Gen:  82 y.o. 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 male here for mouth pain  Pain in lower jaw Concern for possible oral cancer given tenderness over her jaw line, lymphadenopathy, leukoplakia, and extensive tobacco use Weight loss, night sweats are also concerning Urgent referral to ENT Placed today Basic labs including CBC, CMP, TSH collected today Follow-up in our clinic in 4 weeks to ensure continuity     Virginia Crews, MD  [facility] PGY-2,  [place] [facility] 02/14/2016  5:02 PM

## 2016-02-14 NOTE — Assessment & Plan Note (Signed)
Concern for possible oral cancer given tenderness over her jaw line, lymphadenopathy, leukoplakia, and extensive tobacco use Weight loss, night sweats are also concerning Urgent referral to ENT Placed today Basic labs including CBC, CMP, TSH collected today Follow-up in our clinic in 4 weeks to ensure continuity

## 2016-02-15 ENCOUNTER — Encounter: Payer: Self-pay | Admitting: Family Medicine

## 2016-02-29 DIAGNOSIS — K219 Gastro-esophageal reflux disease without esophagitis: Secondary | ICD-10-CM | POA: Diagnosis not present

## 2016-02-29 DIAGNOSIS — F17290 Nicotine dependence, other tobacco product, uncomplicated: Secondary | ICD-10-CM | POA: Diagnosis not present

## 2016-02-29 DIAGNOSIS — R070 Pain in throat: Secondary | ICD-10-CM | POA: Diagnosis not present

## 2016-02-29 DIAGNOSIS — R1310 Dysphagia, unspecified: Secondary | ICD-10-CM | POA: Diagnosis not present

## 2016-06-04 ENCOUNTER — Encounter: Payer: Self-pay | Admitting: Cardiovascular Disease

## 2016-06-04 ENCOUNTER — Other Ambulatory Visit: Payer: PPO | Admitting: *Deleted

## 2016-06-04 ENCOUNTER — Ambulatory Visit (INDEPENDENT_AMBULATORY_CARE_PROVIDER_SITE_OTHER): Payer: PPO | Admitting: Cardiovascular Disease

## 2016-06-04 VITALS — BP 108/50 | HR 78 | Ht 62.0 in | Wt 150.0 lb

## 2016-06-04 DIAGNOSIS — I5042 Chronic combined systolic (congestive) and diastolic (congestive) heart failure: Secondary | ICD-10-CM

## 2016-06-04 DIAGNOSIS — I5022 Chronic systolic (congestive) heart failure: Secondary | ICD-10-CM | POA: Diagnosis not present

## 2016-06-04 DIAGNOSIS — R0602 Shortness of breath: Secondary | ICD-10-CM | POA: Diagnosis not present

## 2016-06-04 DIAGNOSIS — I2510 Atherosclerotic heart disease of native coronary artery without angina pectoris: Secondary | ICD-10-CM | POA: Diagnosis not present

## 2016-06-04 DIAGNOSIS — Z23 Encounter for immunization: Secondary | ICD-10-CM | POA: Diagnosis not present

## 2016-06-04 HISTORY — DX: Chronic systolic (congestive) heart failure: I50.22

## 2016-06-04 LAB — COMPREHENSIVE METABOLIC PANEL
ALBUMIN: 4 g/dL (ref 3.6–5.1)
ALT: 10 U/L (ref 9–46)
AST: 16 U/L (ref 10–35)
Alkaline Phosphatase: 53 U/L (ref 40–115)
BILIRUBIN TOTAL: 0.8 mg/dL (ref 0.2–1.2)
BUN: 27 mg/dL — ABNORMAL HIGH (ref 7–25)
CALCIUM: 9.2 mg/dL (ref 8.6–10.3)
CHLORIDE: 103 mmol/L (ref 98–110)
CO2: 23 mmol/L (ref 20–31)
Creat: 1.5 mg/dL — ABNORMAL HIGH (ref 0.70–1.11)
GLUCOSE: 123 mg/dL — AB (ref 65–99)
Potassium: 4.1 mmol/L (ref 3.5–5.3)
Sodium: 137 mmol/L (ref 135–146)
Total Protein: 7.2 g/dL (ref 6.1–8.1)

## 2016-06-04 LAB — LIPID PANEL
CHOL/HDL RATIO: 2.5 ratio (ref <=5.0)
CHOLESTEROL: 159 mg/dL (ref 125–200)
HDL: 63 mg/dL (ref >=40)
LDL Cholesterol: 87 mg/dL (ref <130)
Triglycerides: 44 mg/dL (ref <150)
VLDL: 9 mg/dL (ref <30)

## 2016-06-04 NOTE — Progress Notes (Signed)
Cardiology Office Note   Date:  06/04/2016   ID:  [name] Elliott, DOB [birth year], MRN [id number]  PCP:  [name] Houseman, MD  Cardiologist:   [name] Moores, MD   Chief Complaint  Patient presents with  . 6 month F/U    chf, CAD   Problem list:  1. Moderate coronary artery disease 2. Dementia 3. Peripheral vascular disease 4. CVA 5.   History of Present Illness:  82 year old gentleman with a history of moderate diffuse coronary artery disease. We treated medically. The left anterior descending arteries/ 1st diagonal stenosis has not was not suitable for PCI.  He complains of being sick in general is weak sensation. He also complains of some left arm pain. When I saw him last month. He was having some episodes of chest pain. We were trying to decide whether or not these were due to angina. I asked him to take nitroglycerin when he had these episodes of pain. At times the nitroglycerin helps him and at other times the nitroglycerin did not do anything. When he walks up a hill in his backyard he has significant shortness breath and this "sick feeling".  He has had a partial shoulder replacement for a shoulder fracture. He has had chronic shoulder pain since that time.  He has chronic knee pain and is considering knee surgery. His office visit was for the purpose of preoperative evaluation.  Sept. 17, 2013- He has no cardiac complaints. He denies any chest pain or dyspnea. He still has some left shoulder stiffness from his surgery February 05, 2012. He has had some wheezing and was recently started on steroids and an antibiotic.  He also ws diagnosed with peripheral neurophy He was prescribed gabapentin but his daughter did not fill the medication because it was too expensive.  She bought some over-the-counter vitamin B12 which seems to be helping a little bit.  November 26, 2012:  [name] Elliott was recently hospitalized last week for peptic ulcer Disease. He had vomiting of  coffee-ground emesis. He also had blood in his stool. [facility] discontinued his [name] Elliott powders and his [name] Elliott. He seems to be doing well from a cardiac standpoint. No angina.  He is having trouble with leg pain.   Jan. 20, 2015:  No CP. He has lots of fatigue especially if he tries to walk any distance.  April 21, 2014,   November 02, 2014;  [name] Elliott is a 82 y.o. male who presents for his coronary artery disease.   He was seen by [name] Elliott several weeks ago. He had a stroke back in November. We've placed a event monitor looking for atrial fib . There was no evidence of atrial fibrillation on the monitor. There is some mention of atrial fibrillation in past charts but we've not been able to locate any confirmation of that.  He complains of leg pain today.   He keeps his legs elevated 3-4 hours a day.  Has seen [facility] and they had no solutions for his leg pain.    No additional stroke symptoms.  He was seen with his granddaughter this am.  Aug. 30, 2016:  He has been diagnosed with DVTs.  Has been on xarelto. Needs to have knee  Replacement .   Feb. 28, 2017: Still having lot of left leg swelling from his left TKA . No CP . Breathing is ok Unable to walk very far at all   Oct. 2, 2017:  Is having some shortness of breath  -  especially with any exertion  Last echo was 08/02/14 and showed LVEF of 45-50%. Grade 1 DD.  Mild - mod MR  Eats lots of salty foods according to family Has some leg swelling   Past Medical History:  Diagnosis Date  . Arthritis   . Arthropathy, unspecified, site unspecified   . Atrial fibrillation ([place])   . Blood dyscrasia    thromboctopenia pt family states he was never told of this  . CAD (coronary artery disease)    last cath in 2012. Managed medically-some blockages  . Chronic lower back pain   . Constipation, chronic   . Diabetes mellitus without complication ([place])    pt states he doesn't have  . DVT (deep venous thrombosis) ([place])     . Dysrhythmia   . Esophageal reflux   . 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 MRI Nov '11 - tendonosis and partial tear. Left shoulder surgery Jan '12 for partial shoulder replacement. [name] Elliott)   . Headache(784.0)   . History of stomach ulcers 11/2012  . Hypothyroidism    pt states he doeen't have   . Orthostasis   . Other B-complex deficiencies   . Other malaise and fatigue   . Pain in joint, shoulder region    Has chronic shoulder pain  . Pain in limb   . Persistent disorder of initiating or maintaining sleep   . Pneumonia 1980's?; 2011  . Primary localized osteoarthritis of left knee 07/12/2015  . Pure hypercholesterolemia   . PVC's (premature ventricular contractions)   . S/P cardiac cath 08/08/11   mild to moderate CAD primarily in the LAD. None are obstructive and appear stable from prior cath in 2007; managed medically  . Sebaceous cyst   . Stroke ([place])   . Thrombocytopenia, unspecified   . Unspecified essential hypertension   . Vascular dementia without behavioral disturbance    with periods of amnesia    Past Surgical History:  Procedure Laterality Date  . BACK SURGERY    . CARDIAC CATHETERIZATION  08/2011  . CATARACT EXTRACTION Left 2009  . COLONOSCOPY    . ESOPHAGOGASTRODUODENOSCOPY N/A 11/19/2012   Procedure: ESOPHAGOGASTRODUODENOSCOPY (EGD);  Surgeon: [name] Bears, MD;  Location: [facility];  Service: Gastroenterology;  Laterality: N/A;  . FINGER AMPUTATION Right    pinky finger  . HAND SURGERY Right    crush injury, right fifth digit contracture, limited  motion  . HARDWARE REMOVAL  02/05/2012   Procedure: HARDWARE REMOVAL;  Surgeon: [name] Bridge, MD;  Location: [facility];  Service: Orthopedics;  Laterality: Left;  . KNEE SURGERY Left 1991   "did it twice in 1 wk" (02/17/2013)  . LUMBAR SPINE SURGERY  2007   Dr  [name] Elliott ([place])  . REVERSE SHOULDER ARTHROPLASTY  02/05/2012   Procedure: REVERSE SHOULDER ARTHROPLASTY;  Surgeon:  [name] Bridge, MD;  Location: [facility];  Service: Orthopedics;  Laterality: Left;  . SHOULDER HEMI-ARTHROPLASTY    . SHOULDER OPEN ROTATOR CUFF REPAIR Left 8.30.2011  . TOTAL KNEE ARTHROPLASTY Left 07/12/2015   Procedure: TOTAL KNEE ARTHROPLASTY;  Surgeon: Marchia Bond, MD;  Location: [facility];  Service: Orthopedics;  Laterality: Left;  . US ECHOCARDIOGRAPHY  02-28-2010   Est EF 50-55%     Current Outpatient Prescriptions  Medication Sig Dispense Refill  . aspirin EC 81 MG tablet Take 81 mg by mouth daily.     . baclofen (LIORESAL) 10 MG tablet Take 1 tablet (10 mg total) by mouth 3 (three) times daily.  As needed for muscle spasm 50 tablet 0  . furosemide (LASIX) 40 MG tablet Take 1 tablet (40 mg total) by mouth daily. 90 tablet 3  . gabapentin (NEURONTIN) 300 MG capsule Take 2 capsules (600 mg total) by mouth 3 (three) times daily. 540 capsule 1  . isosorbide mononitrate (IMDUR) 60 MG 24 hr tablet Take 1 tablet (60 mg total) by mouth every morning. 90 tablet 3  . magnesium hydroxide (MILK OF MAGNESIA) 400 MG/5ML suspension Take 15 mLs by mouth daily as needed for mild constipation or moderate constipation.    Marland Kitchen omeprazole (PRILOSEC) 20 MG capsule Take 1 capsule (20 mg total) by mouth 2 (two) times daily before a meal. 180 capsule 3  . ondansetron (ZOFRAN) 4 MG tablet Take 1 tablet (4 mg total) by mouth every 8 (eight) hours as needed for nausea or vomiting. 30 tablet 0  . potassium chloride SA (K-DUR,KLOR-CON) 20 MEQ tablet Take 1 tablet (20 mEq total) by mouth 2 (two) times daily. 180 tablet 3  . Rivaroxaban (XARELTO) 15 MG TABS tablet Take 1 tablet (15 mg total) by mouth daily. 90 tablet 3   No current facility-administered medications for this visit.     Allergies:   Crestor [rosuvastatin calcium]    Social History:  The patient  reports that he quit smoking about 37 years ago. His smoking use included Cigars. His smokeless tobacco use includes Chew. He reports that he does not drink  alcohol or use drugs.   Family History:  The patient's family history includes Breast cancer in his mother; Cancer in his brother and mother; Diabetes in his daughter, mother, and sister; Kidney disease in his father; Stroke in his brother.    ROS:  Please see the history of present illness.    Review of Systems: Constitutional:  denies fever, chills, diaphoresis, appetite change and fatigue.  HEENT: denies photophobia, eye pain, redness, hearing loss, ear pain, congestion, sore throat, rhinorrhea, sneezing, neck pain, neck stiffness and tinnitus.  Respiratory: denies SOB, DOE, cough, chest tightness, and wheezing.  Cardiovascular: denies chest pain, palpitations and leg swelling.  Gastrointestinal: denies nausea, vomiting, abdominal pain, diarrhea, constipation, blood in stool.  Genitourinary: denies dysuria, urgency, frequency, hematuria, flank pain and difficulty urinating.  Musculoskeletal: admits to  Leg pain    Skin: admits to  Wound on his legs   Neurological: denies dizziness, seizures, syncope, weakness, light-headedness, numbness and headaches.   Hematological: denies adenopathy, easy bruising, personal or family bleeding history.  Psychiatric/ Behavioral: denies suicidal ideation, mood changes, confusion, nervousness, sleep disturbance and agitation.       All other systems are reviewed and negative.    PHYSICAL EXAM: VS:  BP (!) 108/50   Pulse 78   Ht 5\' 2"  (1.575 m)   Wt 150 lb (68 kg)   SpO2 90%   BMI 27.44 kg/m  , BMI Body mass index is 27.44 kg/m. GEN: Well nourished, well developed, in no acute distress  HEENT: normal  Neck: no JVD, carotid bruits, or masses Cardiac: RRR; soft systolic murmur  rubs, or gallops, 1+ bilateral leg edema  Respiratory:  clear to auscultation bilaterally, normal work of breathing GI: soft, nontender, nondistended, + BS MS: legs have hair loss.  Appear to have chronic ischemic changes.  Several sores. 1 + leg edema  Skin: warm and  dry, no rash Neuro:  Moves slow.  Was able to get up on the table himself Psych: ? Mild dementia , slow speech  EKG:  EKG is not ordered today. The ekg ordered today demonstrates    Recent Labs: 02/14/2016: ALT 7; BUN 25; Creat 1.59; Hemoglobin 10.8; Platelets 268; Potassium 4.0; Sodium 133; TSH 4.01    Lipid Panel    Component Value Date/Time   CHOL 155 08/02/2014 0334   TRIG 46 08/02/2014 0334   HDL 59 08/02/2014 0334   CHOLHDL 2.6 08/02/2014 0334   VLDL 9 08/02/2014 0334   LDLCALC 87 08/02/2014 0334   LDLDIRECT 140.6 09/25/2013 1030      Wt Readings from Last 3 Encounters:  06/04/16 150 lb (68 kg)  02/14/16 147 lb 12.8 oz (67 kg)  02/06/16 147 lb 6.4 oz (66.9 kg)      Other studies Reviewed: Additional studies/ records that were reviewed today include:  Review of the above records demonstrates:    ASSESSMENT AND PLAN:  1. Moderate coronary artery disease - he's not having any episodes of angina.  2. Dementia  3. Peripheral vascular disease- he has chronic leg pain. He has seen Dr. Trula Slade and there is not much more that can be done for his leg pain.   4. CVA - he's not had any recurrent strokes. There was no evidence of atrial fib on the 30 day event monitor. We'll continue to follow.  5. DVT:  He was found have a deep vein thrombosis recent. He is now on Xarelto. I've cautioned the patient and his family that he should be very careful and avoid falls.    6. Knee arthritis: The patient may need to have knee replacement. I have told him that he would be at moderate to high risk for knee replacement given his heart disease and peripheral vascular disease and underlying medical issues.  7. Chronic combined systolic and diastolic congestive heart failure :    He still eats lots of salt. Discussed with family . He should decrease his salt intake  He has some dyspnea.   Will repeat an echo. Office visit in 6 months    Current medicines are reviewed at length  with the patient today.  The patient does not have concerns regarding medicines.  The following changes have been made:  no change   Disposition:   FU with me in 6  months      [name] Moores, MD  06/04/2016 9:37 AM    [address], [place], [place]  [address] Phone: [phone]; Fax: [phone]

## 2016-06-04 NOTE — Patient Instructions (Signed)
Medication Instructions:  Your physician recommends that you continue on your current medications as directed. Please refer to the Current Medication list given to you today.   Labwork: None Ordered   Testing/Procedures: Your physician has requested that you have an echocardiogram. Echocardiography is a painless test that uses sound waves to create images of your heart. It provides your doctor with information about the size and shape of your heart and how well your heart's chambers and valves are working. This procedure takes approximately one hour. There are no restrictions for this procedure.   Follow-Up Your physician wants you to follow-up in: 6 months with Dr. Nahser.  You will receive a reminder letter in the mail two months in advance. If you don't receive a letter, please call our office to schedule the follow-up appointment.   If you need a refill on your cardiac medications before your next appointment, please call your pharmacy.   Thank you for choosing [facility]! Djuan Talton, RN [phone]    

## 2016-06-13 ENCOUNTER — Other Ambulatory Visit: Payer: Self-pay | Admitting: *Deleted

## 2016-06-13 ENCOUNTER — Telehealth: Payer: Self-pay | Admitting: Internal Medicine

## 2016-06-13 MED ORDER — GABAPENTIN 300 MG PO CAPS
600.0000 mg | ORAL_CAPSULE | Freq: Three times a day (TID) | ORAL | 1 refills | Status: DC
Start: 2016-06-13 — End: 2017-01-12

## 2016-06-13 NOTE — Telephone Encounter (Signed)
Attempted to call and discuss Gabapentin dose. No answer, will attempt again tomorrow.

## 2016-06-15 ENCOUNTER — Ambulatory Visit (HOSPITAL_COMMUNITY): Payer: PPO | Attending: Cardiology

## 2016-06-15 ENCOUNTER — Other Ambulatory Visit: Payer: Self-pay

## 2016-06-15 DIAGNOSIS — R0602 Shortness of breath: Secondary | ICD-10-CM | POA: Insufficient documentation

## 2016-06-15 DIAGNOSIS — E785 Hyperlipidemia, unspecified: Secondary | ICD-10-CM | POA: Diagnosis not present

## 2016-06-15 DIAGNOSIS — I739 Peripheral vascular disease, unspecified: Secondary | ICD-10-CM | POA: Insufficient documentation

## 2016-06-15 DIAGNOSIS — I351 Nonrheumatic aortic (valve) insufficiency: Secondary | ICD-10-CM | POA: Insufficient documentation

## 2016-06-15 DIAGNOSIS — Z8673 Personal history of transient ischemic attack (TIA), and cerebral infarction without residual deficits: Secondary | ICD-10-CM | POA: Insufficient documentation

## 2016-06-15 DIAGNOSIS — E119 Type 2 diabetes mellitus without complications: Secondary | ICD-10-CM | POA: Diagnosis not present

## 2016-06-15 DIAGNOSIS — I5022 Chronic systolic (congestive) heart failure: Secondary | ICD-10-CM | POA: Insufficient documentation

## 2016-06-15 DIAGNOSIS — I2510 Atherosclerotic heart disease of native coronary artery without angina pectoris: Secondary | ICD-10-CM | POA: Diagnosis not present

## 2016-06-19 ENCOUNTER — Other Ambulatory Visit: Payer: Self-pay | Admitting: *Deleted

## 2016-06-19 ENCOUNTER — Telehealth: Payer: Self-pay | Admitting: Cardiovascular Disease

## 2016-06-19 DIAGNOSIS — I2510 Atherosclerotic heart disease of native coronary artery without angina pectoris: Secondary | ICD-10-CM

## 2016-06-19 MED ORDER — RIVAROXABAN 15 MG PO TABS: 15.0000 mg | ORAL_TABLET | Freq: Every day | ORAL | 0 refills | Status: DC

## 2016-06-19 NOTE — Telephone Encounter (Signed)
New message      Daughter is calling to talk to a nurse about a letter pt received from Dr Acie Fredrickson regarding his medication

## 2016-06-19 NOTE — Telephone Encounter (Signed)
Thirty day rx sent to local pharmacy per patients daughters request.

## 2016-06-19 NOTE — Telephone Encounter (Signed)
Spoke with pr dtr, recent lab work discussed, no medication changes were made.

## 2016-07-17 ENCOUNTER — Other Ambulatory Visit: Payer: Self-pay | Admitting: Cardiovascular Disease

## 2016-07-17 DIAGNOSIS — I2510 Atherosclerotic heart disease of native coronary artery without angina pectoris: Secondary | ICD-10-CM

## 2016-08-10 ENCOUNTER — Ambulatory Visit: Payer: PPO | Admitting: Nurse Practitioner

## 2016-08-14 ENCOUNTER — Telehealth: Payer: Self-pay

## 2016-08-14 ENCOUNTER — Ambulatory Visit: Payer: PPO | Admitting: Nurse Practitioner

## 2016-08-14 ENCOUNTER — Ambulatory Visit (INDEPENDENT_AMBULATORY_CARE_PROVIDER_SITE_OTHER): Payer: PPO | Admitting: Physician Assistant

## 2016-08-14 ENCOUNTER — Ambulatory Visit (INDEPENDENT_AMBULATORY_CARE_PROVIDER_SITE_OTHER): Payer: PPO

## 2016-08-14 ENCOUNTER — Ambulatory Visit (HOSPITAL_COMMUNITY)
Admission: RE | Admit: 2016-08-14 | Discharge: 2016-08-14 | Disposition: A | Discharge status: Home / Self Care | Payer: PPO | Source: Ambulatory Visit | Attending: Physician Assistant | Admitting: Physician Assistant

## 2016-08-14 VITALS — BP 130/80 | HR 76 | Temp 97.6°F | Resp 17 | Ht 62.0 in | Wt 162.0 lb

## 2016-08-14 DIAGNOSIS — R9389 Abnormal findings on diagnostic imaging of other specified body structures: Secondary | ICD-10-CM

## 2016-08-14 DIAGNOSIS — R0981 Nasal congestion: Secondary | ICD-10-CM

## 2016-08-14 DIAGNOSIS — R05 Cough: Secondary | ICD-10-CM

## 2016-08-14 DIAGNOSIS — I2510 Atherosclerotic heart disease of native coronary artery without angina pectoris: Secondary | ICD-10-CM | POA: Insufficient documentation

## 2016-08-14 DIAGNOSIS — R938 Abnormal findings on diagnostic imaging of other specified body structures: Secondary | ICD-10-CM | POA: Diagnosis not present

## 2016-08-14 DIAGNOSIS — I700 Atherosclerosis of aorta: Secondary | ICD-10-CM | POA: Insufficient documentation

## 2016-08-14 DIAGNOSIS — R0602 Shortness of breath: Secondary | ICD-10-CM | POA: Diagnosis not present

## 2016-08-14 DIAGNOSIS — R059 Cough, unspecified: Secondary | ICD-10-CM

## 2016-08-14 LAB — POCT CBC
Granulocyte percent: 56.2 %G (ref 37–80)
HCT, POC: 31.3 % — AB (ref 43.5–53.7)
Hemoglobin: 10.5 g/dL — AB (ref 14.1–18.1)
Lymph, poc: 2.3 (ref 0.6–3.4)
MCH, POC: 24.5 pg — AB (ref 27–31.2)
MCHC: 33.6 g/dL (ref 31.8–35.4)
MCV: 72.9 fL — AB (ref 80–97)
MID (cbc): 0.7 (ref 0–0.9)
MPV: 7.6 fL (ref 0–99.8)
POC Granulocyte: 3.8 (ref 2–6.9)
POC LYMPH PERCENT: 33.8 %L (ref 10–50)
POC MID %: 10 % (ref 0–12)
Platelet Count, POC: 258 10*3/uL (ref 142–424)
RBC: 4.3 M/uL — AB (ref 4.69–6.13)
RDW, POC: 15.6 %
WBC: 6.8 10*3/uL (ref 4.6–10.2)

## 2016-08-14 MED ORDER — AZITHROMYCIN 250 MG PO TABS: ORAL_TABLET | ORAL | 0 refills | Status: DC

## 2016-08-14 MED ORDER — IOPAMIDOL (ISOVUE-300) INJECTION 61%
INTRAVENOUS | Status: AC
  Administered 2016-08-14: 75 mL
  Filled 2016-08-14: qty 75

## 2016-08-14 MED ORDER — IOPAMIDOL (ISOVUE-300) INJECTION 61%
INTRAVENOUS | Status: AC
  Filled 2016-08-14: qty 100

## 2016-08-14 MED ORDER — BENZONATATE 100 MG PO CAPS: 100.0000 mg | ORAL_CAPSULE | Freq: Three times a day (TID) | ORAL | 0 refills | Status: DC | PRN

## 2016-08-14 NOTE — Patient Instructions (Addendum)
   Thank you for coming in today. I hope you feel we met your needs.  Feel free to call [facility] if you have any questions or further requests.  Please consider signing up for MyChart if you do not already have it, as this is a great way to communicate with me.  Best,  Whitney McVey, PA-C     IF you received an x-ray today, you will receive an invoice from [facility]. Please contact [facility] at [phone] with questions or concerns regarding your invoice.   IF you received labwork today, you will receive an invoice from [employer]. Please contact [facility] at [phone] with questions or concerns regarding your invoice.   Our billing staff will not be able to assist you with questions regarding bills from these companies.  You will be contacted with the lab results as soon as they are available. The fastest way to get your results is to activate your My Chart account. Instructions are located on the last page of this paperwork. If you have not heard from [place] regarding the results in 2 weeks, please contact this office.

## 2016-08-14 NOTE — Progress Notes (Signed)
I called patient, spoke with his daughter who was with him at his appt today. Discussed the results. Patient has known CAD and is followed by cardiologist every six months. Asked for pt to f/u next week once course of antibiotics is complete.

## 2016-08-14 NOTE — Telephone Encounter (Signed)
[facility] preauth called to get auth for pts emergency CT that is scheduled for 2:30 today. The request has been submitted online as Urgent. Called and spoke with the local office to draw their attention, they stated that it can still take up to 48 hours and to not with hold care if deemed medically necessary.

## 2016-08-14 NOTE — Progress Notes (Signed)
[name] Elliott  MRN: [id number] DOB: [birth year]  PCP: [name] Houseman, MD  Subjective:  Pt is a pleasant 82 year old male PMH HTN, CAD, CHF, TIA, DVT, DM, hypothyroidism, HLD who presents to clinic for cough and congestion x five days. He is here today with his daughter. He complains of productive cough throughout the day, also keeps him up at night. Also notes nasal congestion with runny nose. C/o occasional SOB, difficulty taking a deep breath. No known sick contacts.   Has tried DayQuil, not helping. Denies fever, chills, chest pain, palpitations, nausea, vomiting, lightheadedness.  He had the flu shot this year. Current on pneumococcal vaccination - about 10 years ago. Has had pneumonia in the past, about 10 years ago.   Review of Systems  Constitutional: Positive for fatigue. Negative for chills, fever and unexpected weight change.  HENT: Positive for congestion.   Respiratory: Positive for cough.   Cardiovascular: Negative for chest pain and palpitations.  Gastrointestinal: Negative for abdominal pain, diarrhea, nausea and vomiting.  Psychiatric/Behavioral: Positive for sleep disturbance.    Patient Active Problem List   Diagnosis Date Noted  . Shortness of breath 06/04/2016  . Chronic systolic CHF (congestive heart failure) ([place]) 06/04/2016  . Pain in lower jaw 02/14/2016  . Abdominal pain 12/12/2015  . Bilateral leg edema 12/12/2015  . Dizziness 09/27/2015  . Primary localized osteoarthritis of left knee 07/12/2015  . Debility 02/23/2015  . Superficial hematoma 01/25/2015  . Encounter for chronic pain management 01/25/2015  . Injury of right elbow 01/13/2015  . Injury of right forearm 01/13/2015  . Inguinal lymphadenopathy 12/28/2014  . DVT (deep venous thrombosis) ([place]) 12/14/2014  . Combined congestive systolic and diastolic heart failure ([place]) 12/14/2014  . Cerebral infarction due to unspecified mechanism   . TIA (transient ischemic attack) 08/01/2014  .  Varicose veins of both legs with edema 05/07/2014  . Bilateral leg pain 04/16/2014  . Chronic kidney disease, stage III (moderate) 12/21/2013  . Vascular dementia without behavioral disturbance   . Type 2 diabetes mellitus, controlled, with renal complications ([place]) 123456  . Diastolic dysfunction 123XX123  . Knee pain, bilateral 03/01/2013  . Unspecified hereditary and idiopathic peripheral neuropathy 11/26/2012  . Osteoporosis, unspecified 11/26/2012  . GI bleed due to NSAIDs, DDX=Isch colitis, infectious colitis 11/19/2012  . Cough 05/29/2012  . 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 CONSTIPATION, CHRONIC 08/02/2010  . INSOMNIA, CHRONIC 03/08/2010  . Unspecified hypothyroidism 01/04/2010  . VITAMIN B12 DEFICIENCY 01/04/2010  . HYPERCHOLESTEROLEMIA 08/12/2009  . Essential hypertension 08/12/2009  . Coronary atherosclerosis of native coronary artery 08/12/2009  . GERD 08/12/2009    Current Outpatient Prescriptions on File Prior to Visit  Medication Sig Dispense Refill  . aspirin EC 81 MG tablet Take 81 mg by mouth daily.     . furosemide (LASIX) 40 MG tablet Take 1 tablet (40 mg total) by mouth daily. 90 tablet 3  . gabapentin (NEURONTIN) 300 MG capsule Take 2 capsules (600 mg total) by mouth 3 (three) times daily. 540 capsule 1  . isosorbide mononitrate (IMDUR) 60 MG 24 hr tablet Take 1 tablet (60 mg total) by mouth every morning. 90 tablet 3  . magnesium hydroxide (MILK OF MAGNESIA) 400 MG/5ML suspension Take 15 mLs by mouth daily as needed for mild constipation or moderate constipation.    Marland Kitchen omeprazole (PRILOSEC) 20 MG capsule Take 1 capsule (20 mg total) by mouth 2 (two) times daily before a meal. 180 capsule 3  .  potassium chloride SA (K-DUR,KLOR-CON) 20 MEQ tablet Take 1 tablet (20 mEq total) by mouth 2 (two) times daily. 180 tablet 3  . XARELTO 15 MG TABS tablet TAKE ONE TABLET BY MOUTH ONCE DAILY 90 tablet 3  . [DISCONTINUED] Hyoscyamine Sulfate  (HYOMAX-DT) 0.375 MG TBCR Take 1 tab bid for 5 days, then prn abd pain 30 each 1   No current facility-administered medications on file prior to visit.     Allergies  Allergen Reactions  . Crestor [Rosuvastatin Calcium] Other (See Comments)    Muscle pain/aches     Objective:  BP 130/80 (BP Location: Right Arm, Patient Position: Sitting, Cuff Size: Normal)   Pulse 76   Temp 97.6 F (36.4 C) (Oral)   Resp 17   Ht 5\' 2"  (1.575 m)   Wt 162 lb (73.5 kg)   SpO2 98%   BMI 29.63 kg/m   Physical Exam  Constitutional: He is oriented to person, place, and time and well-developed, well-nourished, and in no distress. No distress.  Cardiovascular: Normal rate, regular rhythm and normal heart sounds.   Pulmonary/Chest: Effort normal. No respiratory distress. He has rales in the right lower field.  Neurological: He is alert and oriented to person, place, and time. GCS score is 15.  Skin: Skin is warm and dry.  Psychiatric: Mood, memory, affect and judgment normal.  Vitals reviewed.   Dg Chest 2 View  Result Date: 08/14/2016 CLINICAL DATA:  Cough EXAM: CHEST  2 VIEW COMPARISON:  Chest x-ray of 09/24/2015 FINDINGS: Compared to the prior chest x-ray, there is an apparent new nodular lesion in the right suprahilar region medially on the frontal view. This may overlie the hilum on the lateral view. This lesion is worrisome for neoplasm and CT of the chest with IV contrast media is recommended. Otherwise the lungs are clear. Mediastinal and hilar contours are unremarkable. The heart is borderline enlarged and stable. The bones are diffusely osteopenic. Compression of a lower thoracic vertebral body is stable. A left shoulder prosthesis is noted. IMPRESSION: 1. Nodular opacity in the right suprahilar region worrisome for neoplasm. Recommend CT of the chest with IV contrast media. 2. Stable cardiomegaly. Electronically Signed   By: Ivar Drape M.D.   On: 08/14/2016 11:43   Results for orders placed or  performed in visit on 08/14/16  POCT CBC  Result Value Ref Range   WBC 6.8 4.6 - 10.2 K/uL   Lymph, poc 2.3 0.6 - 3.4   POC LYMPH PERCENT 33.8 10 - 50 %L   MID (cbc) 0.7 0 - 0.9   POC MID % 10.0 0 - 12 %M   POC Granulocyte 3.8 2 - 6.9   Granulocyte percent 56.2 37 - 80 %G   RBC 4.30 (A) 4.69 - 6.13 M/uL   Hemoglobin 10.5 (A) 14.1 - 18.1 g/dL   HCT, POC 31.3 (A) 43.5 - 53.7 %   MCV 72.9 (A) 80 - 97 fL   MCH, POC 24.5 (A) 27 - 31.2 pg   MCHC 33.6 31.8 - 35.4 g/dL   RDW, POC 15.6 %   Platelet Count, POC 258 142 - 424 K/uL   MPV 7.6 0 - 99.8 fL    Assessment and Plan :  1. Cough 2. Nasal congestion 3. Abnormal finding on chest xray - DG Chest 2 View; Future - POCT CBC - benzonatate (TESSALON) 100 MG capsule; Take 1-2 capsules (100-200 mg total) by mouth 3 (three) times daily as needed for cough.  Dispense: 40  capsule; Refill: 0 - azithromycin (ZITHROMAX) 250 MG tablet; Take 2 tabs PO x 1 dose, then 1 tab PO QD x 4 days  Dispense: 6 tablet; Refill: 0 - CT Chest W Contrast; Future - Negative chest x-ray and CBC. Due to physical exam finding of LLF consolidation, will treat for infection.  - Will contact with CT results.    Mercer Pod, PA-C  [facility] 08/14/2016 11:06 AM

## 2016-08-15 LAB — POCT I-STAT CREATININE: Creatinine, Ser: 1.7 mg/dL — ABNORMAL HIGH (ref 0.61–1.24)

## 2016-08-22 ENCOUNTER — Encounter: Payer: Self-pay | Admitting: Physician Assistant

## 2016-08-22 ENCOUNTER — Ambulatory Visit (INDEPENDENT_AMBULATORY_CARE_PROVIDER_SITE_OTHER): Payer: PPO

## 2016-08-22 ENCOUNTER — Ambulatory Visit (INDEPENDENT_AMBULATORY_CARE_PROVIDER_SITE_OTHER): Payer: PPO | Admitting: Physician Assistant

## 2016-08-22 VITALS — BP 110/60 | HR 73 | Temp 97.6°F | Resp 17 | Ht 62.0 in | Wt 158.0 lb

## 2016-08-22 DIAGNOSIS — K219 Gastro-esophageal reflux disease without esophagitis: Secondary | ICD-10-CM | POA: Diagnosis not present

## 2016-08-22 DIAGNOSIS — R0982 Postnasal drip: Secondary | ICD-10-CM | POA: Diagnosis not present

## 2016-08-22 DIAGNOSIS — R0789 Other chest pain: Secondary | ICD-10-CM | POA: Diagnosis not present

## 2016-08-22 DIAGNOSIS — R0981 Nasal congestion: Secondary | ICD-10-CM

## 2016-08-22 DIAGNOSIS — R059 Cough, unspecified: Secondary | ICD-10-CM

## 2016-08-22 DIAGNOSIS — R05 Cough: Secondary | ICD-10-CM

## 2016-08-22 LAB — POCT CBC
Granulocyte percent: 66.1 %G (ref 37–80)
HCT, POC: 30.4 % — AB (ref 43.5–53.7)
Hemoglobin: 10.3 g/dL — AB (ref 14.1–18.1)
Lymph, poc: 2.2 (ref 0.6–3.4)
MCH, POC: 24.4 pg — AB (ref 27–31.2)
MCHC: 33.9 g/dL (ref 31.8–35.4)
MCV: 72.1 fL — AB (ref 80–97)
MID (cbc): 0.7 (ref 0–0.9)
MPV: 8.3 fL (ref 0–99.8)
POC Granulocyte: 5.6 (ref 2–6.9)
POC LYMPH PERCENT: 26.2 %L (ref 10–50)
POC MID %: 7.7 % (ref 0–12)
Platelet Count, POC: 258 10*3/uL (ref 142–424)
RBC: 4.22 M/uL — AB (ref 4.69–6.13)
RDW, POC: 16 %
WBC: 8.5 10*3/uL (ref 4.6–10.2)

## 2016-08-22 MED ORDER — CODEINE POLT-CHLORPHEN POLT ER 14.7-2.8 MG/5ML PO SUER: 10.0000 mL | Freq: Two times a day (BID) | ORAL | 0 refills | Status: DC

## 2016-08-22 MED ORDER — LORATADINE 10 MG PO TABS: 10.0000 mg | ORAL_TABLET | Freq: Every day | ORAL | 1 refills | Status: DC

## 2016-08-22 MED ORDER — FLUTICASONE PROPIONATE 50 MCG/ACT NA SUSP: 2.0000 | Freq: Every day | NASAL | 6 refills | Status: DC

## 2016-08-22 MED ORDER — HYDROCODONE-HOMATROPINE 5-1.5 MG/5ML PO SYRP: 5.0000 mL | ORAL_SOLUTION | Freq: Three times a day (TID) | ORAL | 0 refills | Status: DC | PRN

## 2016-08-22 MED ORDER — RANITIDINE HCL 150 MG PO TABS: 150.0000 mg | ORAL_TABLET | Freq: Two times a day (BID) | ORAL | 0 refills | Status: DC

## 2016-08-22 MED ORDER — OMEPRAZOLE 20 MG PO CPDR: 20.0000 mg | DELAYED_RELEASE_CAPSULE | Freq: Two times a day (BID) | ORAL | 3 refills | Status: DC

## 2016-08-22 NOTE — Progress Notes (Signed)
[name] Elliott  MRN: [id number] DOB: [birth year]  PCP: [name] Houseman, MD  Subjective:  Pt is an 82 year old male PMH HTN, CAD, CHF, TIA, DVT, Dm, hypothyroidism, HLD  who presents to clinic for f/u cough.  He was here eight days ago, WBC 6.8, Chest x-ray showed nodular opacity in right suprahilar region. He was sent for a CT which found no worrisome pulmonary lesions or acute findings, no mediastinal or hilar mass. There is a Stable calcification or thoracic and upper abdominal branch vessels ostial calcifications.  He was treated with Z-pack and Tessalon pearls due to physical exam of rales in RLL. Asked to f/u in one week.   Today he states his cough is still present and he still feels sick, "if I had a nickel for every time I blow my nose, I'd be a rich man". Cough keeps his up at night. Cough is worse when he lays down flat, better when he sits up. Endorses occasional difficulty taking a deep breath with coughing. He now c/o chest wall pain when he coughs.  Tried Delsym, Tessalon. Not helping.  History of pneumonia - three times in one year. About 10 years ago.    Review of Systems  Constitutional: Negative for chills, diaphoresis and fever.  HENT: Positive for congestion and rhinorrhea. Negative for sinus pain, sinus pressure, sneezing, sore throat and trouble swallowing.   Respiratory: Positive for cough and shortness of breath. Negative for chest tightness and wheezing.   Cardiovascular: Positive for chest pain (chest wall pain w cough). Negative for palpitations and leg swelling.  Gastrointestinal: Negative for abdominal pain, diarrhea, nausea and vomiting.  Musculoskeletal: Negative for neck pain.  Neurological: Negative for dizziness, syncope, light-headedness and headaches.  Psychiatric/Behavioral: Positive for sleep disturbance. The patient is not nervous/anxious.     Patient Active Problem List   Diagnosis Date Noted  . Shortness of breath 06/04/2016  . Chronic  systolic CHF (congestive heart failure) ([place]) 06/04/2016  . Pain in lower jaw 02/14/2016  . Abdominal pain 12/12/2015  . Bilateral leg edema 12/12/2015  . Dizziness 09/27/2015  . Primary localized osteoarthritis of left knee 07/12/2015  . Debility 02/23/2015  . Superficial hematoma 01/25/2015  . Encounter for chronic pain management 01/25/2015  . Injury of right elbow 01/13/2015  . Injury of right forearm 01/13/2015  . Inguinal lymphadenopathy 12/28/2014  . DVT (deep venous thrombosis) ([place]) 12/14/2014  . Combined congestive systolic and diastolic heart failure ([place]) 12/14/2014  . Cerebral infarction due to unspecified mechanism   . TIA (transient ischemic attack) 08/01/2014  . Varicose veins of both legs with edema 05/07/2014  . Bilateral leg pain 04/16/2014  . Chronic kidney disease, stage III (moderate) 12/21/2013  . Vascular dementia without behavioral disturbance   . Type 2 diabetes mellitus, controlled, with renal complications ([place]) 123456  . Diastolic dysfunction 123XX123  . Knee pain, bilateral 03/01/2013  . Unspecified hereditary and idiopathic peripheral neuropathy 11/26/2012  . Osteoporosis, unspecified 11/26/2012  . GI bleed due to NSAIDs, DDX=Isch colitis, infectious colitis 11/19/2012  . Cough 05/29/2012  . 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 CONSTIPATION, CHRONIC 08/02/2010  . INSOMNIA, CHRONIC 03/08/2010  . Unspecified hypothyroidism 01/04/2010  . VITAMIN B12 DEFICIENCY 01/04/2010  . HYPERCHOLESTEROLEMIA 08/12/2009  . Essential hypertension 08/12/2009  . Coronary atherosclerosis of native coronary artery 08/12/2009  . GERD 08/12/2009    Current Outpatient Prescriptions on File Prior to Visit  Medication Sig Dispense Refill  . aspirin EC 81 MG  tablet Take 81 mg by mouth daily.     . benzonatate (TESSALON) 100 MG capsule Take 1-2 capsules (100-200 mg total) by mouth 3 (three) times daily as needed for cough. 40 capsule 0  . furosemide  (LASIX) 40 MG tablet Take 1 tablet (40 mg total) by mouth daily. 90 tablet 3  . gabapentin (NEURONTIN) 300 MG capsule Take 2 capsules (600 mg total) by mouth 3 (three) times daily. 540 capsule 1  . isosorbide mononitrate (IMDUR) 60 MG 24 hr tablet Take 1 tablet (60 mg total) by mouth every morning. 90 tablet 3  . magnesium hydroxide (MILK OF MAGNESIA) 400 MG/5ML suspension Take 15 mLs by mouth daily as needed for mild constipation or moderate constipation.    Marland Kitchen omeprazole (PRILOSEC) 20 MG capsule Take 1 capsule (20 mg total) by mouth 2 (two) times daily before a meal. 180 capsule 3  . potassium chloride SA (K-DUR,KLOR-CON) 20 MEQ tablet Take 1 tablet (20 mEq total) by mouth 2 (two) times daily. 180 tablet 3  . XARELTO 15 MG TABS tablet TAKE ONE TABLET BY MOUTH ONCE DAILY 90 tablet 3  . [DISCONTINUED] Hyoscyamine Sulfate (HYOMAX-DT) 0.375 MG TBCR Take 1 tab bid for 5 days, then prn abd pain 30 each 1   No current facility-administered medications on file prior to visit.     Allergies  Allergen Reactions  . Crestor [Rosuvastatin Calcium] Other (See Comments)    Muscle pain/aches     Objective:  Pulse 73   Temp 97.6 F (36.4 C) (Oral)   Resp 17   Ht 5\' 2"  (1.575 m)   Wt 158 lb (71.7 kg)   SpO2 98%   BMI 28.90 kg/m   Physical Exam  Constitutional: He is oriented to person, place, and time and well-developed, well-nourished, and in no distress. No distress.  HENT:  Right Ear: Tympanic membrane normal.  Left Ear: Tympanic membrane normal.  Mouth/Throat: Mucous membranes are normal.  Cardiovascular: Normal rate, regular rhythm and normal heart sounds.   Pulmonary/Chest: Effort normal. He has wheezes in the right upper field. He has rhonchi in the right middle field. He has no rales.  Neurological: He is alert and oriented to person, place, and time. GCS score is 15.  Skin: Skin is warm and dry.  Psychiatric: Mood, memory, affect and judgment normal.  Vitals reviewed.   Results for  orders placed or performed in visit on 08/22/16  POCT CBC  Result Value Ref Range   WBC 8.5 4.6 - 10.2 K/uL   Lymph, poc 2.2 0.6 - 3.4   POC LYMPH PERCENT 26.2 10 - 50 %L   MID (cbc) 0.7 0 - 0.9   POC MID % 7.7 0 - 12 %M   POC Granulocyte 5.6 2 - 6.9   Granulocyte percent 66.1 37 - 80 %G   RBC 4.22 (A) 4.69 - 6.13 M/uL   Hemoglobin 10.3 (A) 14.1 - 18.1 g/dL   HCT, POC 30.4 (A) 43.5 - 53.7 %   MCV 72.1 (A) 80 - 97 fL   MCH, POC 24.4 (A) 27 - 31.2 pg   MCHC 33.9 31.8 - 35.4 g/dL   RDW, POC 16.0 %   Platelet Count, POC 258 142 - 424 K/uL   MPV 8.3 0 - 99.8 fL   Dg Chest 2 View  Result Date: 08/22/2016 CLINICAL DATA:  Cough for 2 weeks EXAM: CHEST  2 VIEW COMPARISON:  08/14/2016 FINDINGS: Cardiomediastinal silhouette is stable. No infiltrate or pleural effusion. No  pulmonary edema. Osteopenia and degenerative changes thoracic spine. Left shoulder prosthesis. IMPRESSION: No active cardiopulmonary disease. Electronically Signed   By: Lahoma Crocker M.D.   On: 08/22/2016 12:33    Assessment and Plan :  This case was discussed with Dr. Carlota Raspberry.  1. Cough 2. Chest wall pain 3. Nasal congestion 4. PND (post-nasal drip) 5. Gastroesophageal reflux disease without esophagitis - POCT CBC - DG Chest 2 View; Future - Codeine Polt-Chlorphen Polt ER (TUZISTRA XR) 14.7-2.8 MG/5ML SUER; Take 10 mLs by mouth 2 (two) times daily.  Dispense: 1 Bottle; Refill: 0 - loratadine (CLARITIN) 10 MG tablet; Take 1 tablet (10 mg total) by mouth daily.  Dispense: 30 tablet; Refill: 1 - fluticasone (FLONASE) 50 MCG/ACT nasal spray; Place 2 sprays into both nostrils daily.  Dispense: 16 g; Refill: 6 - ranitidine (ZANTAC) 150 MG tablet; Take 1 tablet (150 mg total) by mouth 2 (two) times daily.  Dispense: 60 tablet; Refill: 0 - omeprazole (PRILOSEC) 20 MG capsule; Take 1 capsule (20 mg total) by mouth 2 (two) times daily before a meal.  Dispense: 180 capsule; Refill: 3 - Labs and chest x-ray are negative, will treat  supportively. RTC in 5-7 days if no improvement.    Mercer Pod, PA-C  [facility] 08/22/2016 10:51 AM

## 2016-08-22 NOTE — Patient Instructions (Addendum)
Your lab work and chest x-ray are negative. This does not look like an infection. Will treat supportively.  Your cough is more than likely due to a combination of your nasal drainage and reflux. Prilosec and Zantac is for reflux symptoms. Flonase and Claritin is for nasal drainage.   Thank you for coming in today. I hope you feel we met your needs.  Feel free to call [facility] if you have any questions or further requests.  Please consider signing up for MyChart if you do not already have it, as this is a great way to communicate with me.  Best,  Whitney McVey, PA-C   IF you received an x-ray today, you will receive an invoice from [facility]. Please contact [facility] at [phone] with questions or concerns regarding your invoice.   IF you received labwork today, you will receive an invoice from [place]. Please contact [facility] at [phone] with questions or concerns regarding your invoice.   Our billing staff will not be able to assist you with questions regarding bills from these companies.  You will be contacted with the lab results as soon as they are available. The fastest way to get your results is to activate your My Chart account. Instructions are located on the last page of this paperwork. If you have not heard from [place] regarding the results in 2 weeks, please contact this office.

## 2016-09-10 ENCOUNTER — Ambulatory Visit: Payer: PPO | Admitting: Nurse Practitioner

## 2016-09-28 ENCOUNTER — Other Ambulatory Visit: Payer: Self-pay | Admitting: *Deleted

## 2016-09-28 DIAGNOSIS — I5042 Chronic combined systolic (congestive) and diastolic (congestive) heart failure: Secondary | ICD-10-CM

## 2016-09-28 DIAGNOSIS — K219 Gastro-esophageal reflux disease without esophagitis: Secondary | ICD-10-CM

## 2016-09-28 MED ORDER — FUROSEMIDE 40 MG PO TABS: 40.0000 mg | ORAL_TABLET | Freq: Every day | ORAL | 3 refills | Status: DC

## 2016-09-28 MED ORDER — POTASSIUM CHLORIDE CRYS ER 20 MEQ PO TBCR: 20.0000 meq | EXTENDED_RELEASE_TABLET | Freq: Two times a day (BID) | ORAL | 3 refills | Status: DC

## 2016-09-28 MED ORDER — OMEPRAZOLE 20 MG PO CPDR: 20.0000 mg | DELAYED_RELEASE_CAPSULE | Freq: Two times a day (BID) | ORAL | 3 refills | Status: DC

## 2016-10-17 ENCOUNTER — Other Ambulatory Visit: Payer: Self-pay | Admitting: Cardiovascular Disease

## 2016-10-18 ENCOUNTER — Encounter: Payer: Self-pay | Admitting: Physician Assistant

## 2016-10-18 ENCOUNTER — Ambulatory Visit (INDEPENDENT_AMBULATORY_CARE_PROVIDER_SITE_OTHER): Payer: PPO | Admitting: Physician Assistant

## 2016-10-18 VITALS — BP 122/60 | HR 76 | Temp 97.9°F | Ht 60.0 in | Wt 155.0 lb

## 2016-10-18 DIAGNOSIS — M79604 Pain in right leg: Secondary | ICD-10-CM | POA: Diagnosis not present

## 2016-10-18 DIAGNOSIS — R252 Cramp and spasm: Secondary | ICD-10-CM | POA: Diagnosis not present

## 2016-10-18 DIAGNOSIS — R609 Edema, unspecified: Secondary | ICD-10-CM

## 2016-10-18 DIAGNOSIS — I872 Venous insufficiency (chronic) (peripheral): Secondary | ICD-10-CM | POA: Diagnosis not present

## 2016-10-18 NOTE — Patient Instructions (Addendum)
Please wear compression socks every day. Keep wound covered to prevent further insult by compression socks.  You can get these at [facility] medical supply:  Address: [address] [place], [place] [address]  Phone: [phone]  Vascular will call you to schedule an appointment.  Please come back in 1-2 months for recheck.   Thank you for coming in today. I hope you feel we met your needs.  Feel free to call [facility] if you have any questions or further requests.  Please consider signing up for MyChart if you do not already have it, as this is a great way to communicate with me.  Best,  Whitney McVey, PA-C   IF you received an x-ray today, you will receive an invoice from [facility]. Please contact [facility] at [phone] with questions or concerns regarding your invoice.   IF you received labwork today, you will receive an invoice from [place]. Please contact [facility] at [phone] with questions or concerns regarding your invoice.   Our billing staff will not be able to assist you with questions regarding bills from these companies.  You will be contacted with the lab results as soon as they are available. The fastest way to get your results is to activate your My Chart account. Instructions are located on the last page of this paperwork. If you have not heard from [place] regarding the results in 2 weeks, please contact this office.

## 2016-10-18 NOTE — Progress Notes (Signed)
[name] Elliott  MRN: [id number] DOB: 01/02/1928  PCP: [name] Houseman, MD  Subjective:  Pt is a pleasant 83 year old male PMH HTN, CAD, DVT, TIA, GERD, GI bleeds due to NSAID use, diabetes, HLD who presents to clinic for leg swelling. He is here today with his family.   Leg swelling  - right lower leg x 3-4 days. Painful. Leg hurts him at night while sleeping, wakes him up from sleep. Tossing leg over the side of his bed does not make it better. Swelling is improved in the morning, gets worse when he gets out of bed and moves around. Endorses some calf pain/muscle cramping. Endorses lower leg pain and skin changes x one year.  Does not wear compression socks. He is not able to easily bend over and reach his feet to put socks on. Walks with a cane. Lives alone.    H/o CAD -  Stent placement about 5 years ago.   Review of Systems  Constitutional: Negative for chills and diaphoresis.  Respiratory: Negative for cough, chest tightness, shortness of breath and wheezing.   Cardiovascular: Negative for chest pain, palpitations and leg swelling.  Gastrointestinal: Negative for diarrhea, nausea and vomiting.  Musculoskeletal: Positive for arthralgias, gait problem and joint swelling.  Neurological: Negative for dizziness, syncope, light-headedness and headaches.  Psychiatric/Behavioral: Negative for sleep disturbance.    Patient Active Problem List   Diagnosis Date Noted  . Shortness of breath 06/04/2016  . Chronic systolic CHF (congestive heart failure) ([place]) 06/04/2016  . Pain in lower jaw 02/14/2016  . Abdominal pain 12/12/2015  . Bilateral leg edema 12/12/2015  . Dizziness 09/27/2015  . Primary localized osteoarthritis of left knee 07/12/2015  . Debility 02/23/2015  . Superficial hematoma 01/25/2015  . Encounter for chronic pain management 01/25/2015  . Injury of right elbow 01/13/2015  . Injury of right forearm 01/13/2015  . Inguinal lymphadenopathy 12/28/2014  . DVT (deep  venous thrombosis) ([place]) 12/14/2014  . Combined congestive systolic and diastolic heart failure ([place]) 12/14/2014  . Cerebral infarction due to unspecified mechanism   . TIA (transient ischemic attack) 08/01/2014  . Varicose veins of both legs with edema 05/07/2014  . Bilateral leg pain 04/16/2014  . Chronic kidney disease, stage III (moderate) 12/21/2013  . Vascular dementia without behavioral disturbance   . Type 2 diabetes mellitus, controlled, with renal complications ([place]) 123456  . Diastolic dysfunction 123XX123  . Knee pain, bilateral 03/01/2013  . Unspecified hereditary and idiopathic peripheral neuropathy 11/26/2012  . Osteoporosis, unspecified 11/26/2012  . GI bleed due to NSAIDs, DDX=Isch colitis, infectious colitis 11/19/2012  . Cough 05/29/2012  . 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 CONSTIPATION, CHRONIC 08/02/2010  . INSOMNIA, CHRONIC 03/08/2010  . Unspecified hypothyroidism 01/04/2010  . VITAMIN B12 DEFICIENCY 01/04/2010  . HYPERCHOLESTEROLEMIA 08/12/2009  . Essential hypertension 08/12/2009  . Coronary atherosclerosis of native coronary artery 08/12/2009  . GERD 08/12/2009    Current Outpatient Prescriptions on File Prior to Visit  Medication Sig Dispense Refill  . aspirin EC 81 MG tablet Take 81 mg by mouth daily.     . furosemide (LASIX) 40 MG tablet Take 1 tablet (40 mg total) by mouth daily. 90 tablet 3  . gabapentin (NEURONTIN) 300 MG capsule Take 2 capsules (600 mg total) by mouth 3 (three) times daily. 540 capsule 1  . isosorbide mononitrate (IMDUR) 60 MG 24 hr tablet Take 1 tablet by mouth every morning 90 tablet 2  . magnesium hydroxide (MILK OF  MAGNESIA) 400 MG/5ML suspension Take 15 mLs by mouth daily as needed for mild constipation or moderate constipation.    Marland Kitchen omeprazole (PRILOSEC) 20 MG capsule Take 1 capsule (20 mg total) by mouth 2 (two) times daily before a meal. 180 capsule 3  . potassium chloride SA (K-DUR,KLOR-CON) 20 MEQ  tablet Take 1 tablet (20 mEq total) by mouth 2 (two) times daily. 180 tablet 3  . XARELTO 15 MG TABS tablet TAKE ONE TABLET BY MOUTH ONCE DAILY 90 tablet 3  . loratadine (CLARITIN) 10 MG tablet Take 1 tablet (10 mg total) by mouth daily. (Patient not taking: Reported on 10/18/2016) 30 tablet 1  . [DISCONTINUED] Hyoscyamine Sulfate (HYOMAX-DT) 0.375 MG TBCR Take 1 tab bid for 5 days, then prn abd pain 30 each 1   No current facility-administered medications on file prior to visit.     Allergies  Allergen Reactions  . Crestor [Rosuvastatin Calcium] Other (See Comments)    Muscle pain/aches     Objective:  BP 122/60 (BP Location: Right Arm, Patient Position: Sitting, Cuff Size: Large)   Pulse 76   Temp 97.9 F (36.6 C) (Oral)   Ht 5' (1.524 m)   Wt 155 lb (70.3 kg)   SpO2 97%   BMI 30.27 kg/m   Physical Exam  Constitutional: He is oriented to person, place, and time and well-developed, well-nourished, and in no distress. No distress.  Cardiovascular: Normal rate, regular rhythm and normal heart sounds.   Musculoskeletal:       Legs: Dermis is Pink and waxy, circumferentially right lower leg. 1cm eschar medial leg. No necrosis or ulceration present. Skin is not warm or cold to touch. Pulses present. No palpable cords. TTP lower leg. Pitting edema superior to skin changes.   Neurological: He is alert and oriented to person, place, and time. GCS score is 15.  Skin: Skin is warm and dry.  Psychiatric: Mood, memory, affect and judgment normal.  Vitals reviewed.   Assessment and Plan :  1. Chronic venous insufficiency 2. Leg pain, anterior, right - Ambulatory referral to Vascular Surgery - Physical exam consistent with venous insufficiency. Encouraged pt to wear compression socks daily, cover wound with gauze dressing. F/u in 1 month. Will refer to vascular to rule out arterial involvement.  3. Cramp in limb 4. Edema, unspecified type - TSH - CBC with Differential/Platelet - Basic  metabolic panel - Labs pending, will follow.   Mercer Pod, PA-C  [facility] 10/18/2016 12:59 PM

## 2016-10-19 LAB — CBC WITH DIFFERENTIAL/PLATELET
Basophils Absolute: 0 10*3/uL (ref 0.0–0.2)
Basos: 0 %
EOS (ABSOLUTE): 0.4 10*3/uL (ref 0.0–0.4)
Eos: 6 %
Hematocrit: 35.3 % — ABNORMAL LOW (ref 37.5–51.0)
Hemoglobin: 11 g/dL — ABNORMAL LOW (ref 13.0–17.7)
Immature Grans (Abs): 0 10*3/uL (ref 0.0–0.1)
Immature Granulocytes: 0 %
Lymphocytes Absolute: 2 10*3/uL (ref 0.7–3.1)
Lymphs: 30 %
MCH: 23.2 pg — ABNORMAL LOW (ref 26.6–33.0)
MCHC: 31.2 g/dL — ABNORMAL LOW (ref 31.5–35.7)
MCV: 74 fL — ABNORMAL LOW (ref 79–97)
Monocytes Absolute: 0.5 10*3/uL (ref 0.1–0.9)
Monocytes: 8 %
Neutrophils Absolute: 3.6 10*3/uL (ref 1.4–7.0)
Neutrophils: 56 %
Platelets: 322 10*3/uL (ref 150–379)
RBC: 4.75 x10E6/uL (ref 4.14–5.80)
RDW: 18.3 % — ABNORMAL HIGH (ref 12.3–15.4)
WBC: 6.6 10*3/uL (ref 3.4–10.8)

## 2016-10-19 LAB — TSH: TSH: 5.06 u[IU]/mL — ABNORMAL HIGH (ref 0.450–4.500)

## 2016-10-19 LAB — BASIC METABOLIC PANEL
BUN/Creatinine Ratio: 20 (ref 10–24)
BUN: 34 mg/dL — ABNORMAL HIGH (ref 8–27)
Calcium: 9.3 mg/dL (ref 8.6–10.2)
Creatinine, Ser: 1.74 mg/dL — ABNORMAL HIGH (ref 0.76–1.27)
GFR calc Af Amer: 40 mL/min/{1.73_m2} — ABNORMAL LOW (ref >59)
GFR calc non Af Amer: 35 mL/min/{1.73_m2} — ABNORMAL LOW (ref >59)
Sodium: 136 mmol/L (ref 134–144)

## 2016-10-19 LAB — BASIC METABOLIC PANEL WITH GFR
CO2: 21 mmol/L (ref 18–29)
Chloride: 94 mmol/L — ABNORMAL LOW (ref 96–106)
Glucose: 122 mg/dL — ABNORMAL HIGH (ref 65–99)
Potassium: 4 mmol/L (ref 3.5–5.2)

## 2016-11-21 DIAGNOSIS — M25511 Pain in right shoulder: Secondary | ICD-10-CM | POA: Diagnosis not present

## 2016-11-21 DIAGNOSIS — M25512 Pain in left shoulder: Secondary | ICD-10-CM | POA: Diagnosis not present

## 2016-11-21 DIAGNOSIS — Z96652 Presence of left artificial knee joint: Secondary | ICD-10-CM | POA: Diagnosis not present

## 2016-11-28 ENCOUNTER — Encounter: Payer: Self-pay | Admitting: Surgery

## 2016-12-06 ENCOUNTER — Other Ambulatory Visit: Payer: Self-pay | Admitting: *Deleted

## 2016-12-06 DIAGNOSIS — M25561 Pain in right knee: Secondary | ICD-10-CM

## 2016-12-10 ENCOUNTER — Encounter: Payer: Self-pay | Admitting: Surgery

## 2016-12-10 ENCOUNTER — Ambulatory Visit (HOSPITAL_COMMUNITY)
Admission: RE | Admit: 2016-12-10 | Discharge: 2016-12-10 | Disposition: A | Discharge status: Home / Self Care | Payer: PPO | Source: Ambulatory Visit | Attending: Surgery | Admitting: Surgery

## 2016-12-10 ENCOUNTER — Ambulatory Visit (INDEPENDENT_AMBULATORY_CARE_PROVIDER_SITE_OTHER): Payer: PPO | Admitting: Surgery

## 2016-12-10 VITALS — BP 118/67 | HR 67 | Temp 97.0°F | Resp 20 | Ht 60.0 in | Wt 151.0 lb

## 2016-12-10 DIAGNOSIS — L97301 Non-pressure chronic ulcer of unspecified ankle limited to breakdown of skin: Secondary | ICD-10-CM | POA: Diagnosis not present

## 2016-12-10 DIAGNOSIS — I83003 Varicose veins of unspecified lower extremity with ulcer of ankle: Secondary | ICD-10-CM

## 2016-12-10 DIAGNOSIS — I83811 Varicose veins of right lower extremities with pain: Secondary | ICD-10-CM | POA: Diagnosis not present

## 2016-12-10 DIAGNOSIS — M25561 Pain in right knee: Secondary | ICD-10-CM | POA: Insufficient documentation

## 2016-12-10 NOTE — Progress Notes (Signed)
Vascular and Vein Specialist of [facility]  Patient name: [name] Elliott MRN: [id number] DOB: 1927-12-18 Sex: male   REASON FOR VISIT:    Venous disease  HISOTRY OF PRESENT ILLNESS:    [name] Elliott is a 83 y.o. male that I initially met in 2015 with a similar problem.  He is having significant leg pain and swelling which was getting worse.  The right leg bothers him more than the left.  He reported having some sort of vein procedure about 10 years ago.  When he walks his legs get more swollen and painful.  At that time he had bilateral deep venous reflux and right superficial venous reflux.  He was unable to wear compression stockings as he felt they made him worse.  Back then, because his symptoms were bilateral and he only had superficial venous reflux on the right I did not feel like treating this would make a big difference and so we elected to treat him nonoperatively  The patient recently has developed worsening symptoms in both legs associated with ulcers which have now healed.  He has noted a darkening of the skin color and   PAST MEDICAL HISTORY:   Past Medical History:  Diagnosis Date  . Arthritis   . Arthropathy, unspecified, site unspecified   . Atrial fibrillation ([place])   . Blood dyscrasia    thromboctopenia pt family states he was never told of this  . CAD (coronary artery disease)    last cath in 2012. Managed medically-some blockages  . Chronic lower back pain   . Chronic systolic CHF (congestive heart failure) ([place]) 06/04/2016  . Constipation, chronic   . Diabetes mellitus without complication ([place])    pt states he doesn't have  . DVT (deep venous thrombosis) ([place])   . Dysrhythmia   . Esophageal reflux   . 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 MRI Nov '11 - tendonosis and partial tear. Left shoulder surgery Jan '12 for partial shoulder replacement. [name])   . Headache(784.0)   . History of  stomach ulcers 11/2012  . Hypothyroidism    pt states he doeen't have   . Orthostasis   . Other B-complex deficiencies   . Other malaise and fatigue   . Pain in joint, shoulder region    Has chronic shoulder pain  . Pain in limb   . Persistent disorder of initiating or maintaining sleep   . Pneumonia 1980's?; 2011  . Primary localized osteoarthritis of left knee 07/12/2015  . Pure hypercholesterolemia   . PVC's (premature ventricular contractions)   . S/P cardiac cath 08/08/11   mild to moderate CAD primarily in the LAD. None are obstructive and appear stable from prior cath in 2007; managed medically  . Sebaceous cyst   . Stroke ([place])   . Thrombocytopenia, unspecified   . Unspecified essential hypertension   . Vascular dementia without behavioral disturbance    with periods of amnesia     FAMILY HISTORY:   Family History  Problem Relation Age of Onset  . Breast cancer Mother   . Cancer Mother   . Diabetes Mother   . Cancer Brother     throat  . Stroke Brother   . Kidney disease Father   . Diabetes Sister   . Diabetes Daughter   . Colon cancer Neg Hx   . Heart attack Neg Hx     SOCIAL HISTORY:   Social History  Substance Use Topics  . Smoking status: Former Smoker  Types: Cigars    Quit date: 09/03/1978  . Smokeless tobacco: Current User    Types: Chew     Comment: 02/17/2013 "I aien't smoked a cigar in 20-30 yr"  . Alcohol use No     ALLERGIES:   Allergies  Allergen Reactions  . Crestor [Rosuvastatin Calcium] Other (See Comments)    Muscle pain/aches     CURRENT MEDICATIONS:   Current Outpatient Prescriptions  Medication Sig Dispense Refill  . aspirin EC 81 MG tablet Take 81 mg by mouth daily.     . furosemide (LASIX) 40 MG tablet Take 1 tablet (40 mg total) by mouth daily. 90 tablet 3  . gabapentin (NEURONTIN) 300 MG capsule Take 2 capsules (600 mg total) by mouth 3 (three) times daily. 540 capsule 1  . isosorbide mononitrate (IMDUR) 60 MG 24 hr  tablet Take 1 tablet by mouth every morning 90 tablet 2  . magnesium hydroxide (MILK OF MAGNESIA) 400 MG/5ML suspension Take 15 mLs by mouth daily as needed for mild constipation or moderate constipation.    Marland Kitchen omeprazole (PRILOSEC) 20 MG capsule Take 1 capsule (20 mg total) by mouth 2 (two) times daily before a meal. 180 capsule 3  . potassium chloride SA (K-DUR,KLOR-CON) 20 MEQ tablet Take 1 tablet (20 mEq total) by mouth 2 (two) times daily. 180 tablet 3  . XARELTO 15 MG TABS tablet TAKE ONE TABLET BY MOUTH ONCE DAILY 90 tablet 3  . loratadine (CLARITIN) 10 MG tablet Take 1 tablet (10 mg total) by mouth daily. (Patient not taking: Reported on 10/18/2016) 30 tablet 1   No current facility-administered medications for this visit.     REVIEW OF SYSTEMS:   _0  denotes positive finding, _1  denotes negative finding Cardiac  Comments:  Chest pain or chest pressure:    Shortness of breath upon exertion:    Short of breath when lying flat:    Irregular heart rhythm:        Vascular    Pain in calf, thigh, or hip brought on by ambulation:    Pain in feet at night that wakes you up from your sleep:     Blood clot in your veins:    Leg swelling:  x       Pulmonary    Oxygen at home:    Productive cough:     Wheezing:         Neurologic    Sudden weakness in arms or legs:     Sudden numbness in arms or legs:     Sudden onset of difficulty speaking or slurred speech:    Temporary loss of vision in one eye:     Problems with dizziness:         Gastrointestinal    Blood in stool:     Vomited blood:         Genitourinary    Burning when urinating:     Blood in urine:        Psychiatric    Major depression:         Hematologic    Bleeding problems:    Problems with blood clotting too easily:        Skin    Rashes or ulcers: x       Constitutional    Fever or chills:      PHYSICAL EXAM:   Vitals:   12/10/16 1613  BP: 118/67  Pulse: 67  Resp: 20  Temp: 97 F (36.1 C)  TempSrc: Oral  SpO2: 100%  Weight: 151 lb (68.5 kg)  Height: 5' (1.524 m)    GENERAL: The patient is a well-nourished male, in no acute distress. The vital signs are documented above. CARDIAC: There is a regular rate and rhythm.  VASCULAR: Bilateral lower extremity edema, with brawny discoloration of both lower extremities PULMONARY: Non-labored respirations  MUSCULOSKELETAL: There are no major deformities or cyanosis. NEUROLOGIC: No focal weakness or paresthesias are detected. SKIN: There are no ulcers or rashes noted. PSYCHIATRIC: The patient has a normal affect.  STUDIES:   Venous reflux examination was performed today.  This shows deep vein reflux in the femoral, common femoral, popliteal vein.  There is also reflux within the saphenofemoral junction and great and small saphenous vein.  Saphenous vein diameter measurements are 0.74 cm.  MEDICAL ISSUES:   CEAP Class V:  The patient continues to have significant pain and swelling in both legs.  I stressed the importance of wearing thigh-high 20-30 compression stockings.  He is going to attempt to wear these again.  He knows to keep his legs elevated.  I have him scheduled for follow-up in 3 months to discuss the possibility of endovenous laser ablation.  I have tried to avoid putting him through a procedure, particularly at his age and health, however he continues to have significant disabilities and venous ablation may help alleviate some of his symptoms.    Annamarie Major, MD Vascular and Vein Specialists of [facility] [phone] Pager [phone]

## 2016-12-19 ENCOUNTER — Encounter: Payer: PPO | Admitting: Vascular Surgery

## 2016-12-19 ENCOUNTER — Encounter (HOSPITAL_COMMUNITY): Payer: PPO

## 2016-12-20 ENCOUNTER — Emergency Department (HOSPITAL_COMMUNITY): Payer: PPO

## 2016-12-20 ENCOUNTER — Emergency Department (HOSPITAL_COMMUNITY)
Admission: EM | Admit: 2016-12-20 | Discharge: 2016-12-21 | Disposition: A | Discharge status: Home / Self Care | Payer: PPO | Attending: Emergency Medicine | Admitting: Emergency Medicine

## 2016-12-20 ENCOUNTER — Ambulatory Visit (INDEPENDENT_AMBULATORY_CARE_PROVIDER_SITE_OTHER): Payer: PPO | Admitting: Physician Assistant

## 2016-12-20 ENCOUNTER — Telehealth: Payer: Self-pay | Admitting: Cardiology

## 2016-12-20 VITALS — BP 139/69 | HR 69 | Temp 98.0°F | Resp 18 | Ht 60.0 in | Wt 160.0 lb

## 2016-12-20 DIAGNOSIS — I130 Hypertensive heart and chronic kidney disease with heart failure and stage 1 through stage 4 chronic kidney disease, or unspecified chronic kidney disease: Secondary | ICD-10-CM | POA: Insufficient documentation

## 2016-12-20 DIAGNOSIS — R42 Dizziness and giddiness: Secondary | ICD-10-CM

## 2016-12-20 DIAGNOSIS — Z96652 Presence of left artificial knee joint: Secondary | ICD-10-CM | POA: Diagnosis not present

## 2016-12-20 DIAGNOSIS — R404 Transient alteration of awareness: Secondary | ICD-10-CM | POA: Diagnosis not present

## 2016-12-20 DIAGNOSIS — I509 Heart failure, unspecified: Secondary | ICD-10-CM | POA: Diagnosis not present

## 2016-12-20 DIAGNOSIS — Z7982 Long term (current) use of aspirin: Secondary | ICD-10-CM | POA: Insufficient documentation

## 2016-12-20 DIAGNOSIS — E1122 Type 2 diabetes mellitus with diabetic chronic kidney disease: Secondary | ICD-10-CM | POA: Diagnosis not present

## 2016-12-20 DIAGNOSIS — I2510 Atherosclerotic heart disease of native coronary artery without angina pectoris: Secondary | ICD-10-CM | POA: Insufficient documentation

## 2016-12-20 DIAGNOSIS — Z87891 Personal history of nicotine dependence: Secondary | ICD-10-CM | POA: Insufficient documentation

## 2016-12-20 DIAGNOSIS — R0602 Shortness of breath: Secondary | ICD-10-CM | POA: Diagnosis not present

## 2016-12-20 DIAGNOSIS — I5022 Chronic systolic (congestive) heart failure: Secondary | ICD-10-CM | POA: Insufficient documentation

## 2016-12-20 DIAGNOSIS — Z7901 Long term (current) use of anticoagulants: Secondary | ICD-10-CM | POA: Diagnosis not present

## 2016-12-20 DIAGNOSIS — R079 Chest pain, unspecified: Secondary | ICD-10-CM | POA: Diagnosis not present

## 2016-12-20 DIAGNOSIS — N183 Chronic kidney disease, stage 3 (moderate): Secondary | ICD-10-CM | POA: Diagnosis not present

## 2016-12-20 DIAGNOSIS — Z8673 Personal history of transient ischemic attack (TIA), and cerebral infarction without residual deficits: Secondary | ICD-10-CM | POA: Insufficient documentation

## 2016-12-20 DIAGNOSIS — E039 Hypothyroidism, unspecified: Secondary | ICD-10-CM | POA: Diagnosis not present

## 2016-12-20 DIAGNOSIS — R7989 Other specified abnormal findings of blood chemistry: Secondary | ICD-10-CM

## 2016-12-20 LAB — CBC
HEMATOCRIT: 33.3 % — AB (ref 39.0–52.0)
HEMOGLOBIN: 10.4 g/dL — AB (ref 13.0–17.0)
MCH: 23.3 pg — ABNORMAL LOW (ref 26.0–34.0)
MCHC: 31.2 g/dL (ref 30.0–36.0)
MCV: 74.5 fL — ABNORMAL LOW (ref 78.0–100.0)
Platelets: 210 10*3/uL (ref 150–400)
RBC: 4.47 MIL/uL (ref 4.22–5.81)
RDW: 17.1 % — ABNORMAL HIGH (ref 11.5–15.5)
WBC: 5.6 10*3/uL (ref 4.0–10.5)

## 2016-12-20 LAB — BASIC METABOLIC PANEL
ANION GAP: 10 (ref 5–15)
BUN: 29 mg/dL — ABNORMAL HIGH (ref 6–20)
CO2: 22 mmol/L (ref 22–32)
Calcium: 9 mg/dL (ref 8.9–10.3)
Chloride: 104 mmol/L (ref 101–111)
Creatinine, Ser: 1.9 mg/dL — ABNORMAL HIGH (ref 0.61–1.24)
GFR, EST AFRICAN AMERICAN: 35 mL/min — AB (ref >60)
GFR, EST NON AFRICAN AMERICAN: 30 mL/min — AB (ref >60)
GLUCOSE: 126 mg/dL — AB (ref 65–99)
POTASSIUM: 3.9 mmol/L (ref 3.5–5.1)
Sodium: 136 mmol/L (ref 135–145)

## 2016-12-20 LAB — I-STAT TROPONIN, ED
TROPONIN I, POC: 0 ng/mL (ref 0.00–0.08)
Troponin i, poc: 0 ng/mL (ref 0.00–0.08)

## 2016-12-20 LAB — BRAIN NATRIURETIC PEPTIDE: B NATRIURETIC PEPTIDE 5: 121 pg/mL — AB (ref 0.0–100.0)

## 2016-12-20 NOTE — Patient Instructions (Signed)
     IF you received an x-ray today, you will receive an invoice from [facility]. Please contact [facility] at [phone] with questions or concerns regarding your invoice.   IF you received labwork today, you will receive an invoice from [facility]. Please contact [facility] at [phone] with questions or concerns regarding your invoice.   Our billing staff will not be able to assist you with questions regarding bills from these companies.  You will be contacted with the lab results as soon as they are available. The fastest way to get your results is to activate your My Chart account. Instructions are located on the last page of this paperwork. If you have not heard from us regarding the results in 2 weeks, please contact this office.     

## 2016-12-20 NOTE — ED Notes (Addendum)
Patients daughter presents here with patient states she took her father to urgent care due to Left sided rib pain worse with movement over the past few weeks. Patient states left sided chest pain episodes are usually relieved by goody powders but he tries to avoid medicine. Patient had an episode lasting about 6 hours today prior to going to urgent care. Family also notes patient has been having worsening shortness of breath over the past week. Patient can only walk a few feet without getting winded and he usually is able to walk the dog outside. Patient has been having dizzy spells intermittently for over a month- denies falls.

## 2016-12-20 NOTE — Telephone Encounter (Signed)
Received paged from call service requesting call back to patient. Attempted to call x2, but no answer.   Reino Bellis NP-c

## 2016-12-20 NOTE — ED Notes (Signed)
Pt transported to xray 

## 2016-12-20 NOTE — Progress Notes (Signed)
[name] Elliott  MRN: [id number] DOB: Apr 12, 1928  PCP: Gelene Mink Nakiah Osgood, PA-C  Subjective:  Pt is an 82 year old male PMH CHF, HTN, Cad, TIA, DM, who presents to clinic for dizziness and shortness of breath.  Shob has worsening over the past few months. He can no longer walk more than a few feet without getting shob. He experiences several episodes of "feeling not right". These episodes happen after he is up an moving in the morning, usually while eating breakfast.  Endorses left-sided chest pain - worse with moving his left arm.  Denies syncope, pre-syncope, vision changes, palpitations, nausea, vomiting, wheezing.   h/o CHF - Sees cardiologist regularly. Last appt 6 months ago. Next f/u appt on May 9   Review of Systems  Constitutional: Negative for chills, diaphoresis and fever.  Respiratory: Positive for shortness of breath. Negative for cough, chest tightness and wheezing.   Cardiovascular: Negative for chest pain, palpitations and leg swelling.  Gastrointestinal: Negative for diarrhea, nausea and vomiting.  Neurological: Positive for dizziness. Negative for syncope, light-headedness and headaches.  Psychiatric/Behavioral: Negative for sleep disturbance. The patient is not nervous/anxious.     Patient Active Problem List   Diagnosis Date Noted  . Shortness of breath 06/04/2016  . Chronic systolic CHF (congestive heart failure) ([place]) 06/04/2016  . Pain in lower jaw 02/14/2016  . Abdominal pain 12/12/2015  . Bilateral leg edema 12/12/2015  . Dizziness 09/27/2015  . Primary localized osteoarthritis of left knee 07/12/2015  . Debility 02/23/2015  . Superficial hematoma 01/25/2015  . Encounter for chronic pain management 01/25/2015  . Injury of right elbow 01/13/2015  . Injury of right forearm 01/13/2015  . Inguinal lymphadenopathy 12/28/2014  . DVT (deep venous thrombosis) ([place]) 12/14/2014  . Combined congestive systolic and diastolic heart failure ([place]) 12/14/2014  .  Cerebral infarction due to unspecified mechanism   . TIA (transient ischemic attack) 08/01/2014  . Varicose veins of both legs with edema 05/07/2014  . Bilateral leg pain 04/16/2014  . Chronic kidney disease, stage III (moderate) 12/21/2013  . Vascular dementia without behavioral disturbance   . Type 2 diabetes mellitus, controlled, with renal complications ([place]) 01/00/7121  . Diastolic dysfunction 97/58/8325  . Knee pain, bilateral 03/01/2013  . Unspecified hereditary and idiopathic peripheral neuropathy 11/26/2012  . Osteoporosis, unspecified 11/26/2012  . GI bleed due to NSAIDs, DDX=Isch colitis, infectious colitis 11/19/2012  . Cough 05/29/2012  . 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 CONSTIPATION, CHRONIC 08/02/2010  . INSOMNIA, CHRONIC 03/08/2010  . Unspecified hypothyroidism 01/04/2010  . VITAMIN B12 DEFICIENCY 01/04/2010  . HYPERCHOLESTEROLEMIA 08/12/2009  . Essential hypertension 08/12/2009  . Coronary atherosclerosis of native coronary artery 08/12/2009  . GERD 08/12/2009    Current Outpatient Prescriptions on File Prior to Visit  Medication Sig Dispense Refill  . aspirin EC 81 MG tablet Take 81 mg by mouth daily.     . furosemide (LASIX) 40 MG tablet Take 1 tablet (40 mg total) by mouth daily. 90 tablet 3  . gabapentin (NEURONTIN) 300 MG capsule Take 2 capsules (600 mg total) by mouth 3 (three) times daily. 540 capsule 1  . isosorbide mononitrate (IMDUR) 60 MG 24 hr tablet Take 1 tablet by mouth every morning 90 tablet 2  . loratadine (CLARITIN) 10 MG tablet Take 1 tablet (10 mg total) by mouth daily. 30 tablet 1  . magnesium hydroxide (MILK OF MAGNESIA) 400 MG/5ML suspension Take 15 mLs by mouth daily as needed for mild constipation or moderate  constipation.    Marland Kitchen omeprazole (PRILOSEC) 20 MG capsule Take 1 capsule (20 mg total) by mouth 2 (two) times daily before a meal. 180 capsule 3  . potassium chloride SA (K-DUR,KLOR-CON) 20 MEQ tablet Take 1 tablet  (20 mEq total) by mouth 2 (two) times daily. 180 tablet 3  . XARELTO 15 MG TABS tablet TAKE ONE TABLET BY MOUTH ONCE DAILY 90 tablet 3  . [DISCONTINUED] Hyoscyamine Sulfate (HYOMAX-DT) 0.375 MG TBCR Take 1 tab bid for 5 days, then prn abd pain 30 each 1   No current facility-administered medications on file prior to visit.     Allergies  Allergen Reactions  . Crestor [Rosuvastatin Calcium] Other (See Comments)    Muscle pain/aches     Objective:  BP 139/69   Pulse 69   Temp 98 F (36.7 C) (Oral)   Resp 18   Ht 5' (1.524 m)   Wt 160 lb (72.6 kg)   SpO2 96%   BMI 31.25 kg/m   Physical Exam  Constitutional: He is oriented to person, place, and time and well-developed, well-nourished, and in no distress. No distress.  HENT:  Right Ear: Tympanic membrane normal.  Left Ear: Tympanic membrane normal.  Mouth/Throat: Oropharynx is clear and moist and mucous membranes are normal. He has dentures.  Neck: Neck supple. No hepatojugular reflux and no JVD present. Carotid bruit is not present.  Cardiovascular: Normal rate, regular rhythm, S1 normal, S2 normal, normal heart sounds and normal pulses.  Exam reveals no gallop.   No murmur heard. Pulmonary/Chest: Effort normal. No bradypnea. He has rales in the right lower field and the left lower field.  Musculoskeletal:       Right lower leg: He exhibits edema.       Left lower leg: He exhibits edema.  Neurological: He is alert and oriented to person, place, and time. GCS score is 15.  Skin: Skin is warm and dry.  Psychiatric: Mood, memory, affect and judgment normal.  Vitals reviewed.  EKG - sinus brady with non-specific T-wave abnormalities.  Assessment and Plan :  This case was discussed with Dr. Mitchel Honour 1. Shortness of breath 2. Dizziness 3. Chronic systolic CHF (congestive heart failure) ([place]) 4. Acute on chronic heart failure - Brain natriuretic peptide - EKG 12-Lead - CMP14+EGFR - CBC with Differential/Platelet - TSH -  Orthostatic vital signs - Pt is stable, vital signs wnl. Abnormal EKG. Labs are pending. Suspect decompensating heart failure. Will send pt via EMS [facility] ED for complete work-up. Discussed the plan with pt and his family. They understand and agree.    Mercer Pod, PA-C  [facility] 12/20/2016 6:06 PM

## 2016-12-20 NOTE — ED Triage Notes (Signed)
Per EMS pt from [facility] to be evaluated for worsening shortness of breath and dizziness over the past month. Patient has bilateral pedal edema- family notes is at baseline. Patient endorses generalized fatigue. Urgent Care did 12-lead which showed Sinus Loletha Grayer. Hx of CHF, CAD.

## 2016-12-20 NOTE — ED Provider Notes (Signed)
[facility] DEPT Provider Note   CSN: [id number] Arrival date & time: 12/20/16  1954     History   Chief Complaint Chief Complaint  Patient presents with  . Shortness of Breath  . Near Syncope    HPI [name] Elliott is a 83 y.o. male.  The history is provided by the patient, a relative and medical records.  Shortness of Breath  This is a recurrent problem. The average episode lasts 3 weeks. The problem occurs frequently.The current episode started more than 1 week ago. The problem has been gradually worsening. Associated symptoms include chest pain (after a fall several weeks ago). Pertinent negatives include no fever, no headaches, no sore throat, no ear pain, no cough, no sputum production, no wheezing, no syncope, no vomiting, no abdominal pain, no rash and no leg swelling. Associated symptoms comments: Dizziness. Precipitated by: No obvious trigger. Risk factors: Cardiac history. Treatments tried: Home medication regimen. The treatment provided no relief. He has had prior hospitalizations. He has had prior ED visits. Associated medical issues include CAD and heart failure.    Past Medical History:  Diagnosis Date  . Arthritis   . Arthropathy, unspecified, site unspecified   . Atrial fibrillation ([place])   . Blood dyscrasia    thromboctopenia pt family states he was never told of this  . CAD (coronary artery disease)    last cath in 2012. Managed medically-some blockages  . Chronic lower back pain   . Chronic systolic CHF (congestive heart failure) ([place]) 06/04/2016  . Constipation, chronic   . Diabetes mellitus without complication ([place])    pt states he doesn't have  . DVT (deep venous thrombosis) ([place])   . Dysrhythmia   . Esophageal reflux   . 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 MRI Nov '11 - tendonosis and partial tear. Left shoulder surgery Jan '12 for partial shoulder replacement. [name])   . Headache(784.0)   . History of stomach  ulcers 11/2012  . Hypothyroidism    pt states he doeen't have   . Orthostasis   . Other B-complex deficiencies   . Other malaise and fatigue   . Pain in joint, shoulder region    Has chronic shoulder pain  . Pain in limb   . Persistent disorder of initiating or maintaining sleep   . Pneumonia 1980's?; 2011  . Primary localized osteoarthritis of left knee 07/12/2015  . Pure hypercholesterolemia   . PVC's (premature ventricular contractions)   . S/P cardiac cath 08/08/11   mild to moderate CAD primarily in the LAD. None are obstructive and appear stable from prior cath in 2007; managed medically  . Sebaceous cyst   . Stroke ([place])   . Thrombocytopenia, unspecified ([place])   . Unspecified essential hypertension   . Vascular dementia without behavioral disturbance    with periods of amnesia    Patient Active Problem List   Diagnosis Date Noted  . Shortness of breath 06/04/2016  . Chronic systolic CHF (congestive heart failure) ([place]) 06/04/2016  . Pain in lower jaw 02/14/2016  . Abdominal pain 12/12/2015  . Bilateral leg edema 12/12/2015  . Dizziness 09/27/2015  . Primary localized osteoarthritis of left knee 07/12/2015  . Debility 02/23/2015  . Superficial hematoma 01/25/2015  . Encounter for chronic pain management 01/25/2015  . Injury of right elbow 01/13/2015  . Injury of right forearm 01/13/2015  . Inguinal lymphadenopathy 12/28/2014  . DVT (deep venous thrombosis) ([place]) 12/14/2014  . Combined congestive systolic and diastolic heart failure (  [place]) 12/14/2014  . Cerebral infarction due to unspecified mechanism   . TIA (transient ischemic attack) 08/01/2014  . Varicose veins of both legs with edema 05/07/2014  . Bilateral leg pain 04/16/2014  . Chronic kidney disease, stage III (moderate) 12/21/2013  . Vascular dementia without behavioral disturbance   . Type 2 diabetes mellitus, controlled, with renal complications ([place]) 41/96/2229  . Diastolic dysfunction 79/89/2119  . Knee  pain, bilateral 03/01/2013  . Unspecified hereditary and idiopathic peripheral neuropathy 11/26/2012  . Osteoporosis, unspecified 11/26/2012  . GI bleed due to NSAIDs, DDX=Isch colitis, infectious colitis 11/19/2012  . Cough 05/29/2012  . 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 CONSTIPATION, CHRONIC 08/02/2010  . INSOMNIA, CHRONIC 03/08/2010  . Unspecified hypothyroidism 01/04/2010  . VITAMIN B12 DEFICIENCY 01/04/2010  . HYPERCHOLESTEROLEMIA 08/12/2009  . Essential hypertension 08/12/2009  . Coronary atherosclerosis of native coronary artery 08/12/2009  . GERD 08/12/2009    Past Surgical History:  Procedure Laterality Date  . BACK SURGERY    . CARDIAC CATHETERIZATION  08/2011  . CATARACT EXTRACTION Left 2009  . COLONOSCOPY    . ESOPHAGOGASTRODUODENOSCOPY N/A 11/19/2012   Procedure: ESOPHAGOGASTRODUODENOSCOPY (EGD);  Surgeon: Jerene Bears, MD;  Location: [facility];  Service: Gastroenterology;  Laterality: N/A;  . FINGER AMPUTATION Right    pinky finger  . HAND SURGERY Right    crush injury, right fifth digit contracture, limited  motion  . HARDWARE REMOVAL  02/05/2012   Procedure: HARDWARE REMOVAL;  Surgeon: Johnny Bridge, MD;  Location: [facility];  Service: Orthopedics;  Laterality: Left;  . KNEE SURGERY Left 1991   "did it twice in 1 wk" (02/17/2013)  . LUMBAR SPINE SURGERY  2007   Dr Philip Aspen ([place])  . REVERSE SHOULDER ARTHROPLASTY  02/05/2012   Procedure: REVERSE SHOULDER ARTHROPLASTY;  Surgeon: Johnny Bridge, MD;  Location: [facility];  Service: Orthopedics;  Laterality: Left;  . SHOULDER HEMI-ARTHROPLASTY    . SHOULDER OPEN ROTATOR CUFF REPAIR Left 8.30.2011  . TOTAL KNEE ARTHROPLASTY Left 07/12/2015   Procedure: TOTAL KNEE ARTHROPLASTY;  Surgeon: Marchia Bond, MD;  Location: [facility];  Service: Orthopedics;  Laterality: Left;  . US ECHOCARDIOGRAPHY  02-28-2010   Est EF 50-55%       Home Medications    Prior to Admission medications   Medication Sig  Start Date End Date Taking? Authorizing Provider  aspirin EC 81 MG tablet Take 81 mg by mouth daily.     Historical Provider, MD  furosemide (LASIX) 40 MG tablet Take 1 tablet (40 mg total) by mouth daily. 09/28/16   [name] Houseman, MD  gabapentin (NEURONTIN) 300 MG capsule Take 2 capsules (600 mg total) by mouth 3 (three) times daily. 06/13/16   [name] Houseman, MD  isosorbide mononitrate (IMDUR) 60 MG 24 hr tablet Take 1 tablet by mouth every morning 10/18/16   Thayer Headings, MD  loratadine (CLARITIN) 10 MG tablet Take 1 tablet (10 mg total) by mouth daily. 08/22/16   Gelene Mink McVey, PA-C  magnesium hydroxide (MILK OF MAGNESIA) 400 MG/5ML suspension Take 15 mLs by mouth daily as needed for mild constipation or moderate constipation.    Historical Provider, MD  omeprazole (PRILOSEC) 20 MG capsule Take 1 capsule (20 mg total) by mouth 2 (two) times daily before a meal. 09/28/16   [name] Houseman, MD  potassium chloride SA (K-DUR,KLOR-CON) 20 MEQ tablet Take 1 tablet (20 mEq total) by mouth 2 (two) times daily. 09/28/16   Almond Lint  Dallas Schimke, MD  XARELTO 15 MG TABS tablet TAKE ONE TABLET BY MOUTH ONCE DAILY 07/18/16   Thayer Headings, MD    Family History Family History  Problem Relation Age of Onset  . Breast cancer Mother   . Cancer Mother   . Diabetes Mother   . Cancer Brother     throat  . Stroke Brother   . Kidney disease Father   . Diabetes Sister   . Diabetes Daughter   . Colon cancer Neg Hx   . Heart attack Neg Hx     Social History Social History  Substance Use Topics  . Smoking status: Former Smoker    Types: Cigars    Quit date: 09/03/1978  . Smokeless tobacco: Current User    Types: Chew     Comment: 02/17/2013 "I aien't smoked a cigar in 20-30 yr"  . Alcohol use No     Allergies   Crestor [rosuvastatin calcium]   Review of Systems Review of Systems  Constitutional: Negative for chills and fever.  HENT: Negative for ear pain and sore throat.    Eyes: Negative for pain and visual disturbance.  Respiratory: Positive for shortness of breath. Negative for cough, sputum production and wheezing.   Cardiovascular: Positive for chest pain (after a fall several weeks ago). Negative for palpitations, leg swelling and syncope.  Gastrointestinal: Negative for abdominal pain and vomiting.  Genitourinary: Negative for dysuria and hematuria.  Musculoskeletal: Negative for arthralgias and back pain.  Skin: Negative for color change and rash.  Neurological: Positive for dizziness. Negative for seizures, syncope and headaches.  All other systems reviewed and are negative.    Physical Exam Updated Vital Signs BP (!) 143/72   Pulse (!) 57   Temp 98.6 F (37 C) (Oral)   Resp 10   SpO2 98%   Physical Exam  Constitutional: He appears well-developed and well-nourished.  HENT:  Head: Normocephalic and atraumatic.  Eyes: Conjunctivae are normal.  Neck: Neck supple.  Cardiovascular: Normal rate and regular rhythm.   No murmur heard. HR in 60s during exam  Pulmonary/Chest: Effort normal and breath sounds normal. No respiratory distress.  Abdominal: Soft. There is no tenderness.  Musculoskeletal: He exhibits edema.  1+ edema to the b/l   Neurological: He is alert.  Skin: Skin is warm and dry.  Psychiatric: He has a normal mood and affect.  Nursing note and vitals reviewed.    ED Treatments / Results  Labs (all labs ordered are listed, but only abnormal results are displayed) Labs Reviewed  BASIC METABOLIC PANEL - Abnormal; Notable for the following:       Result Value   Glucose, Bld 126 (*)    BUN 29 (*)    Creatinine, Ser 1.90 (*)    GFR calc non Af Amer 30 (*)    GFR calc Af Amer 35 (*)    All other components within normal limits  CBC - Abnormal; Notable for the following:    Hemoglobin 10.4 (*)    HCT 33.3 (*)    MCV 74.5 (*)    MCH 23.3 (*)    RDW 17.1 (*)    All other components within normal limits  BRAIN  NATRIURETIC PEPTIDE  I-STAT TROPOININ, ED    EKG  EKG Interpretation None       Radiology No results found.  Procedures Procedures (including critical care time)  Medications Ordered in ED Medications - No data to display   Initial Impression / Assessment and  Plan / ED Course  I have reviewed the triage vital signs and the nursing notes.  Pertinent labs & imaging results that were available during my care of the patient were reviewed by me and considered in my medical decision making (see chart for details).    Pt with h/o CHF s/p PPM placement, HTN, mild CAD, TIA, DM, prior CVA, PVD, dementia presents with dizziness & DOE. Daughter says the Pt has been having episodes of dizziness for several months, but over the last 3wks, she says they've become an almost daily event. Over the same period of time she says he's also started getting winded much more easily and can only walk a few steps before becoming tired; he complained to her of some left-sided rib pain after a fall several weeks ago that is exacerbated by movements of his LUE. Pt was seen at [facility] today and they sent him here for a complete evaluation.  VS & exam as above. EKG: SB @ 58bpm w/o signs of ischemia. CXR WNL. Labs remarkable for Hgb 10.4 & Crt 1.90 (both appear to be near baseline). Troponin negative x2. BNP 121.  Explained all results to the Pt & daughter. Using shared decision-making, the Pt & daughter opted for discharge home w/close follow-up over admission for observation.  Will discharge the Pt home. Recommending follow-up with PCP & Cardiology. Strict ED return precautions provided. Pt & daugher acknowledged understanding of, and concurrence with the plan. All questions answered to their satisfaction. In stable condition at the time of discharge.  Final Clinical Impressions(s) / ED Diagnoses   Final diagnoses:  Shortness of breath  Dizziness    New Prescriptions New Prescriptions   No medications on  file     Jenny Reichmann, MD 12/21/16 0034    Gwenyth Allegra Tegeler, MD 12/21/16 [id number]

## 2016-12-21 ENCOUNTER — Other Ambulatory Visit: Payer: Self-pay | Admitting: Cardiovascular Disease

## 2016-12-21 DIAGNOSIS — I2510 Atherosclerotic heart disease of native coronary artery without angina pectoris: Secondary | ICD-10-CM

## 2016-12-21 LAB — CMP14+EGFR
ALT: 10 IU/L (ref 0–44)
AST: 24 IU/L (ref 0–40)
Albumin/Globulin Ratio: 1.3 (ref 1.2–2.2)
Albumin: 4.3 g/dL (ref 3.5–4.7)
Alkaline Phosphatase: 75 IU/L (ref 39–117)
BUN/Creatinine Ratio: 14 (ref 10–24)
BUN: 29 mg/dL — ABNORMAL HIGH (ref 8–27)
Bilirubin Total: 0.3 mg/dL (ref 0.0–1.2)
CO2: 22 mmol/L (ref 18–29)
Calcium: 9.2 mg/dL (ref 8.6–10.2)
Chloride: 98 mmol/L (ref 96–106)
Creatinine, Ser: 2.02 mg/dL — ABNORMAL HIGH (ref 0.76–1.27)
GFR calc Af Amer: 34 mL/min/{1.73_m2} — ABNORMAL LOW (ref >59)
GFR calc non Af Amer: 29 mL/min/{1.73_m2} — ABNORMAL LOW (ref >59)
Globulin, Total: 3.3 g/dL (ref 1.5–4.5)
Glucose: 161 mg/dL — ABNORMAL HIGH (ref 65–99)
Potassium: 4.4 mmol/L (ref 3.5–5.2)
Sodium: 136 mmol/L (ref 134–144)
Total Protein: 7.6 g/dL (ref 6.0–8.5)

## 2016-12-21 LAB — CBC WITH DIFFERENTIAL/PLATELET
Basophils Absolute: 0 10*3/uL (ref 0.0–0.2)
Basos: 0 %
EOS (ABSOLUTE): 0.2 10*3/uL (ref 0.0–0.4)
Eos: 4 %
Hematocrit: 33.4 % — ABNORMAL LOW (ref 37.5–51.0)
Hemoglobin: 10.5 g/dL — ABNORMAL LOW (ref 13.0–17.7)
Immature Grans (Abs): 0 10*3/uL (ref 0.0–0.1)
Immature Granulocytes: 0 %
Lymphocytes Absolute: 1.8 10*3/uL (ref 0.7–3.1)
Lymphs: 32 %
MCH: 23.2 pg — ABNORMAL LOW (ref 26.6–33.0)
MCHC: 31.4 g/dL — ABNORMAL LOW (ref 31.5–35.7)
MCV: 74 fL — ABNORMAL LOW (ref 79–97)
Monocytes Absolute: 0.5 10*3/uL (ref 0.1–0.9)
Monocytes: 10 %
Neutrophils Absolute: 3.1 10*3/uL (ref 1.4–7.0)
Neutrophils: 54 %
Platelets: 242 10*3/uL (ref 150–379)
RBC: 4.52 x10E6/uL (ref 4.14–5.80)
RDW: 17.8 % — ABNORMAL HIGH (ref 12.3–15.4)
WBC: 5.7 10*3/uL (ref 3.4–10.8)

## 2016-12-21 LAB — TSH: TSH: 6.99 u[IU]/mL — ABNORMAL HIGH (ref 0.450–4.500)

## 2016-12-21 LAB — BRAIN NATRIURETIC PEPTIDE: BNP: 144.5 pg/mL — ABNORMAL HIGH (ref 0.0–100.0)

## 2016-12-21 NOTE — Addendum Note (Signed)
Addended by: Dorise Hiss on: 12/21/2016 10:05 AM   Modules accepted: Level of Service

## 2016-12-27 NOTE — Progress Notes (Signed)
Please call pt and have him schedule an appt with me after he sees his cardiologist. Thank you!

## 2016-12-27 NOTE — Progress Notes (Signed)
Will have pt come in to see me after his cardiology appt. Will consider nephrology referral and treatment for thyroid.

## 2017-01-01 ENCOUNTER — Ambulatory Visit (INDEPENDENT_AMBULATORY_CARE_PROVIDER_SITE_OTHER): Payer: PPO | Admitting: Cardiovascular Disease

## 2017-01-01 ENCOUNTER — Encounter: Payer: Self-pay | Admitting: Cardiovascular Disease

## 2017-01-01 VITALS — BP 104/60 | HR 64 | Ht 60.0 in | Wt 163.2 lb

## 2017-01-01 DIAGNOSIS — I2510 Atherosclerotic heart disease of native coronary artery without angina pectoris: Secondary | ICD-10-CM | POA: Diagnosis not present

## 2017-01-01 DIAGNOSIS — I5042 Chronic combined systolic (congestive) and diastolic (congestive) heart failure: Secondary | ICD-10-CM

## 2017-01-01 MED ORDER — ISOSORBIDE MONONITRATE ER 30 MG PO TB24: 30.0000 mg | ORAL_TABLET | Freq: Every day | ORAL | 3 refills | Status: DC

## 2017-01-01 NOTE — Patient Instructions (Signed)
Medication Instructions:  DECREASE Imdur to 30 mg daily   Labwork: None Ordered   Testing/Procedures: None Ordered   Follow-Up: Your physician recommends that you schedule a follow-up appointment in: 3 months with Dr. Acie Fredrickson   If you need a refill on your cardiac medications before your next appointment, please call your pharmacy.   Thank you for choosing [facility]! Christen Bame, RN [phone]

## 2017-01-01 NOTE — Progress Notes (Signed)
Cardiology Office Note   Date:  01/01/2017   ID:  [name] Elliott, DOB Feb 09, 1928, MRN [id number]  PCP:  Dorise Hiss, PA-C  Cardiologist:   Mertie Moores, MD   Chief Complaint  Patient presents with  . Follow-up   Problem list:  1. Moderate coronary artery disease 2. Dementia 3. Peripheral vascular disease 4. CVA 5.   History of Present Illness:  83 year old gentleman with a history of moderate diffuse coronary artery disease. We treated medically. The left anterior descending arteries/ 1st diagonal stenosis has not was not suitable for PCI.  He complains of being sick in general is weak sensation. He also complains of some left arm pain. When I saw him last month. He was having some episodes of chest pain. We were trying to decide whether or not these were due to angina. I asked him to take nitroglycerin when he had these episodes of pain. At times the nitroglycerin helps him and at other times the nitroglycerin did not do anything. When he walks up a hill in his backyard he has significant shortness breath and this "sick feeling".  He has had a partial shoulder replacement for a shoulder fracture. He has had chronic shoulder pain since that time.  He has chronic knee pain and is considering knee surgery. His office visit was for the purpose of preoperative evaluation.  Sept. 17, 2013- He has no cardiac complaints. He denies any chest pain or dyspnea. He still has some left shoulder stiffness from his surgery February 05, 2012. He has had some wheezing and was recently started on steroids and an antibiotic.  He also ws diagnosed with peripheral neurophy He was prescribed gabapentin but his daughter did not fill the medication because it was too expensive.  She bought some over-the-counter vitamin B12 which seems to be helping a little bit.  November 26, 2012:  Mandell was recently hospitalized last week for peptic ulcer Disease. He had vomiting of coffee-ground  emesis. He also had blood in his stool. [facility] discontinued his Gabriel Earing powders and his Fosamax. He seems to be doing well from a cardiac standpoint. No angina.  He is having trouble with leg pain.   Jan. 20, 2015:  No CP. He has lots of fatigue especially if he tries to walk any distance.  April 21, 2014,   November 02, 2014;  Yasiel Goyne is a 83 y.o. male who presents for his coronary artery disease.   He was seen by Richardson Dopp several weeks ago. He had a stroke back in November. We've placed a event monitor looking for atrial fib . There was no evidence of atrial fibrillation on the monitor. There is some mention of atrial fibrillation in past charts but we've not been able to locate any confirmation of that.  He complains of leg pain today.   He keeps his legs elevated 3-4 hours a day.  Has seen [facility] and they had no solutions for his leg pain.    No additional stroke symptoms.  He was seen with his granddaughter this am.  Aug. 30, 2016:  He has been diagnosed with DVTs.  Has been on xarelto. Needs to have knee  Replacement .   Feb. 28, 2017: Still having lot of left leg swelling from his left TKA . No CP . Breathing is ok Unable to walk very far at all   Oct. 2, 2017:  Is having some shortness of breath  - especially with any exertion  Last echo  was 08/02/14 and showed LVEF of 45-50%. Grade 1 DD.  Mild - mod MR  Eats lots of salty foods according to family Has some leg swelling   Jan 01, 2017:  Ayansh is seen today for follow up visit atrial fib, cad, and chronic combined CHF  Has been having lots of dizziness and shortness of breath recently .   Was seen at [facility] He gets dizzy,  Has profound leg weakness.  At times he has orthostatic hypotension   Has headaches.   Reported some chest pain last week that worsened when he moved his arm     Past Medical History:  Diagnosis Date  . Arthritis   . Arthropathy, unspecified, site unspecified   .  Atrial fibrillation ([place])   . Blood dyscrasia    thromboctopenia pt family states he was never told of this  . CAD (coronary artery disease)    last cath in 2012. Managed medically-some blockages  . Chronic lower back pain   . Chronic systolic CHF (congestive heart failure) ([place]) 06/04/2016  . Constipation, chronic   . Diabetes mellitus without complication ([place])    pt states he doesn't have  . DVT (deep venous thrombosis) ([place])   . Dysrhythmia   . Esophageal reflux   . 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 MRI Nov '11 - tendonosis and partial tear. Left shoulder surgery Jan '12 for partial shoulder replacement. [name])   . Headache(784.0)   . History of stomach ulcers 11/2012  . Hypothyroidism    pt states he doeen't have   . Orthostasis   . Other B-complex deficiencies   . Other malaise and fatigue   . Pain in joint, shoulder region    Has chronic shoulder pain  . Pain in limb   . Persistent disorder of initiating or maintaining sleep   . Pneumonia 1980's?; 2011  . Primary localized osteoarthritis of left knee 07/12/2015  . Pure hypercholesterolemia   . PVC's (premature ventricular contractions)   . S/P cardiac cath 08/08/11   mild to moderate CAD primarily in the LAD. None are obstructive and appear stable from prior cath in 2007; managed medically  . Sebaceous cyst   . Stroke ([place])   . Thrombocytopenia, unspecified ([place])   . Unspecified essential hypertension   . Vascular dementia without behavioral disturbance    with periods of amnesia    Past Surgical History:  Procedure Laterality Date  . BACK SURGERY    . CARDIAC CATHETERIZATION  08/2011  . CATARACT EXTRACTION Left 2009  . COLONOSCOPY    . ESOPHAGOGASTRODUODENOSCOPY N/A 11/19/2012   Procedure: ESOPHAGOGASTRODUODENOSCOPY (EGD);  Surgeon: Jerene Bears, MD;  Location: [place];  Service: Gastroenterology;  Laterality: N/A;  . FINGER AMPUTATION Right    pinky finger  . HAND SURGERY  Right    crush injury, right fifth digit contracture, limited  motion  . HARDWARE REMOVAL  02/05/2012   Procedure: HARDWARE REMOVAL;  Surgeon: Johnny Bridge, MD;  Location: [place];  Service: Orthopedics;  Laterality: Left;  . KNEE SURGERY Left 1991   "did it twice in 1 wk" (02/17/2013)  . LUMBAR SPINE SURGERY  2007   Dr Ellarie Picking [place] ([place])  . REVERSE SHOULDER ARTHROPLASTY  02/05/2012   Procedure: REVERSE SHOULDER ARTHROPLASTY;  Surgeon: Johnny Bridge, MD;  Location: [place];  Service: Orthopedics;  Laterality: Left;  . SHOULDER HEMI-ARTHROPLASTY    . SHOULDER OPEN ROTATOR CUFF REPAIR Left 8.30.2011  . TOTAL KNEE ARTHROPLASTY  Left 07/12/2015   Procedure: TOTAL KNEE ARTHROPLASTY;  Surgeon: Marchia Bond, MD;  Location: [place];  Service: Orthopedics;  Laterality: Left;  . US ECHOCARDIOGRAPHY  02-28-2010   Est EF 50-55%     Current Outpatient Prescriptions  Medication Sig Dispense Refill  . aspirin EC 81 MG tablet Take 81 mg by mouth daily.     . furosemide (LASIX) 40 MG tablet Take 1 tablet (40 mg total) by mouth daily. 90 tablet 3  . gabapentin (NEURONTIN) 300 MG capsule Take 2 capsules (600 mg total) by mouth 3 (three) times daily. 540 capsule 1  . isosorbide mononitrate (IMDUR) 60 MG 24 hr tablet Take 1 tablet by mouth every morning 90 tablet 2  . loratadine (CLARITIN) 10 MG tablet Take 1 tablet (10 mg total) by mouth daily. 30 tablet 1  . magnesium hydroxide (MILK OF MAGNESIA) 400 MG/5ML suspension Take 15 mLs by mouth daily as needed for mild constipation or moderate constipation.    Marland Kitchen omeprazole (PRILOSEC) 20 MG capsule Take 1 capsule (20 mg total) by mouth 2 (two) times daily before a meal. 180 capsule 3  . potassium chloride SA (K-DUR,KLOR-CON) 20 MEQ tablet Take 1 tablet (20 mEq total) by mouth 2 (two) times daily. 180 tablet 3  . XARELTO 15 MG TABS tablet TAKE ONE TABLET BY MOUTH ONCE DAILY 90 tablet 3  . XARELTO 15 MG TABS tablet Take 1 tablet by mouth daily 90 tablet 1   No  current facility-administered medications for this visit.     Allergies:   Crestor [rosuvastatin calcium]    Social History:  The patient  reports that he quit smoking about 38 years ago. His smoking use included Cigars. His smokeless tobacco use includes Chew. He reports that he does not drink alcohol or use drugs.   Family History:  The patient's family history includes Breast cancer in his mother; Cancer in his brother and mother; Diabetes in his daughter, mother, and sister; Kidney disease in his father; Stroke in his brother.    ROS:  Please see the history of present illness.    Review of Systems: Constitutional:  denies fever, chills, diaphoresis, appetite change and fatigue.  HEENT: denies photophobia, eye pain, redness, hearing loss, ear pain, congestion, sore throat, rhinorrhea, sneezing, neck pain, neck stiffness and tinnitus.  Respiratory: denies SOB, DOE, cough, chest tightness, and wheezing.  Cardiovascular: denies chest pain, palpitations and leg swelling.  Gastrointestinal: denies nausea, vomiting, abdominal pain, diarrhea, constipation, blood in stool.  Genitourinary: denies dysuria, urgency, frequency, hematuria, flank pain and difficulty urinating.  Musculoskeletal: admits to  Leg pain    Skin: admits to  Wound on his legs   Neurological: denies dizziness, seizures, syncope, weakness, light-headedness, numbness and headaches.   Hematological: denies adenopathy, easy bruising, personal or family bleeding history.  Psychiatric/ Behavioral: denies suicidal ideation, mood changes, confusion, nervousness, sleep disturbance and agitation.       All other systems are reviewed and negative.    PHYSICAL EXAM: VS:  BP 104/60 (BP Location: Left Arm, Patient Position: Sitting, Cuff Size: Normal)   Pulse 64   Ht 5' (1.524 m)   Wt 163 lb 3.2 oz (74 kg)   SpO2 96%   BMI 31.87 kg/m  , BMI Body mass index is 31.87 kg/m. GEN: Well nourished, well developed, in no acute  distress  HEENT: normal  Neck: no JVD, carotid bruits, or masses Cardiac: RRR; soft systolic murmur  rubs, or gallops, 1+ - 2+  bilateral  leg edema  Respiratory:  clear to auscultation bilaterally, normal work of breathing GI: soft, nontender, nondistended, + BS MS: legs have hair loss.    Skin: warm and dry, no rash Neuro:  Moves slow.  Was able to get up on the table himself Psych: ? Mild dementia , slow speech    EKG:  EKG is ordered today. The ekg ordered today demonstrates : Jan 01, 2017:   NSR with PACs.  LAD, INc. RBBB    Recent Labs: 12/20/2016: ALT 10; B Natriuretic Peptide 121.0; BUN 29; Creatinine, Ser 1.90; Hemoglobin 10.4; Platelets 210; Potassium 3.9; Sodium 136; TSH 6.990    Lipid Panel    Component Value Date/Time   CHOL 159 06/04/2016 0857   TRIG 44 06/04/2016 0857   HDL 63 06/04/2016 0857   CHOLHDL 2.5 06/04/2016 0857   VLDL 9 06/04/2016 0857   LDLCALC 87 06/04/2016 0857   LDLDIRECT 140.6 09/25/2013 1030      Wt Readings from Last 3 Encounters:  01/01/17 163 lb 3.2 oz (74 kg)  12/20/16 160 lb (72.6 kg)  12/10/16 151 lb (68.5 kg)      Other studies Reviewed: Additional studies/ records that were reviewed today include:  Review of the above records demonstrates:    ASSESSMENT AND PLAN:  1. Moderate coronary artery disease - he's not having any episodes of angina. Will decrease the dose of the Imdur to 30 mg a day since he is having orthostasis   2. Dementia  3. Peripheral vascular disease- he has chronic leg pain. He has seen Dr. Trula Slade and there is not much more that can be done for his leg pain.   4. CVA - he's not had any recurrent strokes. There was no evidence of atrial fib on the 30 day event monitor. We'll continue to follow.  5. DVT:  He was found have a deep vein thrombosis.  He is now on Xarelto. I've cautioned the patient and his family that he should be very careful and avoid falls.    6. Knee arthritis: The patient may need to  have knee replacement. I have told him that he would be at moderate to high risk for knee replacement given his heart disease and peripheral vascular disease and underlying medical issues.  7. Chronic combined systolic and diastolic congestive heart failure :    He still eats lots of salt. Discussed with family . He should decrease his salt intake  He has some dyspnea.       Current medicines are reviewed at length with the patient today.  The patient does not have concerns regarding medicines.  The following changes have been made:  no change   Disposition:   FU with me in 3  months      Mertie Moores, MD  01/01/2017 4:35 PM    [facility] [place], [place], [place]  [address] Phone: [phone]; Fax: [phone]

## 2017-01-02 ENCOUNTER — Telehealth: Payer: Self-pay | Admitting: Physician Assistant

## 2017-01-02 ENCOUNTER — Telehealth: Payer: Self-pay | Admitting: Vascular Surgery

## 2017-01-02 NOTE — Telephone Encounter (Signed)
Pt daug [name] wanted you to give her a call to go over  information from the patient heart doctor.  Her contact number is [phone]

## 2017-01-02 NOTE — Telephone Encounter (Signed)
Left voice message for patient regarding her 3 month [facility] follow up appt w/ Dr.Lawson. I scheduled this appt for 04/08/17 at 2pm and left a message for her to either call back and confirm or reschedule to another date/time that will work for her.awt

## 2017-01-03 NOTE — Telephone Encounter (Signed)
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

## 2017-01-12 ENCOUNTER — Encounter: Payer: Self-pay | Admitting: Physician Assistant

## 2017-01-12 ENCOUNTER — Ambulatory Visit (INDEPENDENT_AMBULATORY_CARE_PROVIDER_SITE_OTHER): Payer: PPO | Admitting: Physician Assistant

## 2017-01-12 VITALS — BP 138/65 | HR 76 | Temp 97.8°F | Resp 14 | Ht 59.0 in | Wt 157.2 lb

## 2017-01-12 DIAGNOSIS — G609 Hereditary and idiopathic neuropathy, unspecified: Secondary | ICD-10-CM | POA: Diagnosis not present

## 2017-01-12 DIAGNOSIS — W57XXXA Bitten or stung by nonvenomous insect and other nonvenomous arthropods, initial encounter: Secondary | ICD-10-CM

## 2017-01-12 MED ORDER — GABAPENTIN 300 MG PO CAPS: 600.0000 mg | ORAL_CAPSULE | Freq: Three times a day (TID) | ORAL | 1 refills | Status: DC

## 2017-01-12 NOTE — Patient Instructions (Signed)
     IF you received an x-ray today, you will receive an invoice from [facility]. Please contact [facility] at [phone] with questions or concerns regarding your invoice.   IF you received labwork today, you will receive an invoice from [facility]. Please contact [facility] at [phone] with questions or concerns regarding your invoice.   Our billing staff will not be able to assist you with questions regarding bills from these companies.  You will be contacted with the lab results as soon as they are available. The fastest way to get your results is to activate your My Chart account. Instructions are located on the last page of this paperwork. If you have not heard from us regarding the results in 2 weeks, please contact this office.     

## 2017-01-12 NOTE — Progress Notes (Signed)
[name] Elliott  MRN: [id number] DOB: 1927/09/14  PCP: Dorise Hiss, PA-C  Subjective:  Pt is an 83 year old male who presents to clinic for boil on his back. He is here today with his daughter.  He noticed a painful spot on his left side and on his back below his pant line yesterday. Area is tender when he touches it.  Denies MOI.  Denies fever, chills, drainage.  C/o pain controlled with Gabapentin. He has been taking this for several years. Needs refill. Has been out x 2 weeks.   Review of Systems  Constitutional: Negative for chills, diaphoresis, fatigue and fever.  Musculoskeletal: Positive for arthralgias and myalgias.  Skin: Positive for wound.    Patient Active Problem List   Diagnosis Date Noted  . Shortness of breath 06/04/2016  . Chronic combined systolic and diastolic CHF (congestive heart failure) ([place]) 06/04/2016  . Pain in lower jaw 02/14/2016  . Abdominal pain 12/12/2015  . Bilateral leg edema 12/12/2015  . Dizziness 09/27/2015  . Primary localized osteoarthritis of left knee 07/12/2015  . Debility 02/23/2015  . Superficial hematoma 01/25/2015  . Encounter for chronic pain management 01/25/2015  . Injury of right elbow 01/13/2015  . Injury of right forearm 01/13/2015  . Inguinal lymphadenopathy 12/28/2014  . DVT (deep venous thrombosis) ([place]) 12/14/2014  . Combined congestive systolic and diastolic heart failure ([place]) 12/14/2014  . Cerebral infarction due to unspecified mechanism   . TIA (transient ischemic attack) 08/01/2014  . Varicose veins of both legs with edema 05/07/2014  . Bilateral leg pain 04/16/2014  . Chronic kidney disease, stage III (moderate) 12/21/2013  . Vascular dementia without behavioral disturbance   . Type 2 diabetes mellitus, controlled, with renal complications ([place]) 78/29/5621  . Diastolic dysfunction 30/86/5784  . Knee pain, bilateral 03/01/2013  . Unspecified hereditary and idiopathic peripheral neuropathy 11/26/2012   . Osteoporosis, unspecified 11/26/2012  . GI bleed due to NSAIDs, DDX=Isch colitis, infectious colitis 11/19/2012  . Cough 05/29/2012  . 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 CONSTIPATION, CHRONIC 08/02/2010  . INSOMNIA, CHRONIC 03/08/2010  . Unspecified hypothyroidism 01/04/2010  . VITAMIN B12 DEFICIENCY 01/04/2010  . HYPERCHOLESTEROLEMIA 08/12/2009  . Essential hypertension 08/12/2009  . Coronary atherosclerosis of native coronary artery 08/12/2009  . GERD 08/12/2009    Current Outpatient Prescriptions on File Prior to Visit  Medication Sig Dispense Refill  . aspirin EC 81 MG tablet Take 81 mg by mouth daily.     . furosemide (LASIX) 40 MG tablet Take 1 tablet (40 mg total) by mouth daily. 90 tablet 3  . gabapentin (NEURONTIN) 300 MG capsule Take 2 capsules (600 mg total) by mouth 3 (three) times daily. 540 capsule 1  . isosorbide mononitrate (IMDUR) 30 MG 24 hr tablet Take 1 tablet (30 mg total) by mouth daily. 90 tablet 3  . loratadine (CLARITIN) 10 MG tablet Take 1 tablet (10 mg total) by mouth daily. 30 tablet 1  . magnesium hydroxide (MILK OF MAGNESIA) 400 MG/5ML suspension Take 15 mLs by mouth daily as needed for mild constipation or moderate constipation.    Marland Kitchen omeprazole (PRILOSEC) 20 MG capsule Take 1 capsule (20 mg total) by mouth 2 (two) times daily before a meal. 180 capsule 3  . potassium chloride SA (K-DUR,KLOR-CON) 20 MEQ tablet Take 1 tablet (20 mEq total) by mouth 2 (two) times daily. 180 tablet 3  . XARELTO 15 MG TABS tablet Take 1 tablet by mouth daily 90 tablet 1  . [  DISCONTINUED] Hyoscyamine Sulfate (HYOMAX-DT) 0.375 MG TBCR Take 1 tab bid for 5 days, then prn abd pain 30 each 1   No current facility-administered medications on file prior to visit.     Allergies  Allergen Reactions  . Crestor [Rosuvastatin Calcium] Other (See Comments)    Muscle pain/aches     Objective:  BP 138/65 (BP Location: Right Arm, Patient Position: Sitting, Cuff  Size: Normal)   Pulse 76   Resp 14   Ht 4\' 11"  (1.499 m)   Wt 157 lb 3.2 oz (71.3 kg)   SpO2 98%   BMI 31.75 kg/m   Physical Exam  Constitutional: He is oriented to person, place, and time and well-developed, well-nourished, and in no distress. No distress.  Neurological: He is alert and oriented to person, place, and time. GCS score is 15.  Skin: Skin is warm and dry. No rash noted.  Embedded tick lower left back. Tick is brown with white spot on dorsal midline neck area. Removed with tweezers. Head and mouth parts accounted for. No rash. Area is mildly TTP. No drainage, erythema, swelling.   Psychiatric: Mood, memory, affect and judgment normal.  Vitals reviewed.   Assessment and Plan :  1. Tick bite with subsequent removal of tick - Entire tick removed without difficulty. No need for prophylactic antibiotics. RTC if area develops rash, drainage or if pt develops fever, chills, or other worsening symptoms. They understand and agrees with plan. He plans to RTC in 2 weeks for hospital f/u.   2. Hereditary and idiopathic peripheral neuropathy - gabapentin (NEURONTIN) 300 MG capsule; Take 2 capsules (600 mg total) by mouth 3 (three) times daily.  Dispense: 540 capsule; Refill: 1   Whitney Egypt Marchiano, PA-C  [facility] 01/12/2017 12:03 PM

## 2017-02-02 ENCOUNTER — Ambulatory Visit (INDEPENDENT_AMBULATORY_CARE_PROVIDER_SITE_OTHER): Payer: PPO | Admitting: Physician Assistant

## 2017-02-02 ENCOUNTER — Encounter: Payer: Self-pay | Admitting: Physician Assistant

## 2017-02-02 ENCOUNTER — Telehealth: Payer: Self-pay | Admitting: Physician Assistant

## 2017-02-02 VITALS — BP 126/64 | HR 65 | Temp 97.8°F | Resp 17 | Ht 59.0 in | Wt 159.4 lb

## 2017-02-02 DIAGNOSIS — E039 Hypothyroidism, unspecified: Secondary | ICD-10-CM

## 2017-02-02 DIAGNOSIS — N289 Disorder of kidney and ureter, unspecified: Secondary | ICD-10-CM | POA: Diagnosis not present

## 2017-02-02 MED ORDER — LEVOTHYROXINE SODIUM 50 MCG PO TABS: 50.0000 ug | ORAL_TABLET | Freq: Every day | ORAL | 3 refills | Status: DC

## 2017-02-02 MED ORDER — LEVOTHYROXINE SODIUM 25 MCG PO CAPS: 25.0000 ug | ORAL_CAPSULE | Freq: Every day | ORAL | 0 refills | Status: DC

## 2017-02-02 NOTE — Telephone Encounter (Signed)
Pt states that the rx for thyroid that was sent in today will take at least 7 days to fill from the mail order and is wanting to know if [facility] could call in about 7 pills to the [facility] [place] to cover him til the mail order comes in  [place] number [phone]

## 2017-02-02 NOTE — Progress Notes (Signed)
[name] Elliott  MRN: [id number] DOB: 06-15-1928  PCP: Dorise Hiss, PA-C  Subjective:  Pt is an 83 year old male PMH HTN, CHF, CAD, TIA, DM who presents to clinic for f/u lab work. He is here today with his daughter .   He was here 4/19 with worsening shob and dizziness. He was sent to the emergency department via EMS. Initial EKG showed no signs of acute ischemia. Chest x-ray showed no acute other maladies. Patient's lab testing was similar to baseline. BNP 121 without prior for comparison. Troponin was negative times two. Pt was d/c'd. He followed up with his cardilogiest, Dr. Acie Fredrickson a few weeks later. He was found have a deep vein thrombosis.  He is now on Xarelto. I've cautioned the patient and his family that he should be very careful and avoid falls.  h/o CHF - Sees cardiologist q 3 months.   Today continues to c/o continues "feelin real bad", headaches, occasional dizziness. He is still active - mows his law, walks dog.  Lab TSH level was elevated at 6.99. No H/o thyroid disease. Has never been on medications.  Kidney function con't to decline.   Review of Systems  Constitutional: Positive for fatigue. Negative for chills, diaphoresis and fever.  Respiratory: Negative for cough, chest tightness, shortness of breath and wheezing.   Cardiovascular: Negative for chest pain, palpitations and leg swelling.  Gastrointestinal: Negative for abdominal pain, constipation, diarrhea, nausea and vomiting.  Musculoskeletal: Negative for neck pain.  Neurological: Positive for dizziness, weakness and headaches.  Psychiatric/Behavioral: Negative for sleep disturbance.    Patient Active Problem List   Diagnosis Date Noted  . Shortness of breath 06/04/2016  . Chronic combined systolic and diastolic CHF (congestive heart failure) ([place]) 06/04/2016  . Pain in lower jaw 02/14/2016  . Abdominal pain 12/12/2015  . Bilateral leg edema 12/12/2015  . Dizziness 09/27/2015  . Primary  localized osteoarthritis of left knee 07/12/2015  . Debility 02/23/2015  . Superficial hematoma 01/25/2015  . Encounter for chronic pain management 01/25/2015  . Injury of right elbow 01/13/2015  . Injury of right forearm 01/13/2015  . Inguinal lymphadenopathy 12/28/2014  . DVT (deep venous thrombosis) ([place]) 12/14/2014  . Combined congestive systolic and diastolic heart failure ([place]) 12/14/2014  . Cerebral infarction due to unspecified mechanism   . TIA (transient ischemic attack) 08/01/2014  . Varicose veins of both legs with edema 05/07/2014  . Bilateral leg pain 04/16/2014  . Chronic kidney disease, stage III (moderate) 12/21/2013  . Vascular dementia without behavioral disturbance   . Type 2 diabetes mellitus, controlled, with renal complications ([place]) 85/63/1497  . Diastolic dysfunction 02/63/7858  . Knee pain, bilateral 03/01/2013  . Hereditary and idiopathic peripheral neuropathy 11/26/2012  . Osteoporosis, unspecified 11/26/2012  . GI bleed due to NSAIDs, DDX=Isch colitis, infectious colitis 11/19/2012  . Cough 05/29/2012  . 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 CONSTIPATION, CHRONIC 08/02/2010  . INSOMNIA, CHRONIC 03/08/2010  . Unspecified hypothyroidism 01/04/2010  . VITAMIN B12 DEFICIENCY 01/04/2010  . HYPERCHOLESTEROLEMIA 08/12/2009  . Essential hypertension 08/12/2009  . Coronary atherosclerosis of native coronary artery 08/12/2009  . GERD 08/12/2009    Current Outpatient Prescriptions on File Prior to Visit  Medication Sig Dispense Refill  . aspirin EC 81 MG tablet Take 81 mg by mouth daily.     . furosemide (LASIX) 40 MG tablet Take 1 tablet (40 mg total) by mouth daily. 90 tablet 3  . gabapentin (NEURONTIN) 300 MG capsule Take 2  capsules (600 mg total) by mouth 3 (three) times daily. 540 capsule 1  . isosorbide mononitrate (IMDUR) 30 MG 24 hr tablet Take 1 tablet (30 mg total) by mouth daily. 90 tablet 3  . loratadine (CLARITIN) 10 MG tablet  Take 1 tablet (10 mg total) by mouth daily. 30 tablet 1  . magnesium hydroxide (MILK OF MAGNESIA) 400 MG/5ML suspension Take 15 mLs by mouth daily as needed for mild constipation or moderate constipation.    Marland Kitchen omeprazole (PRILOSEC) 20 MG capsule Take 1 capsule (20 mg total) by mouth 2 (two) times daily before a meal. 180 capsule 3  . potassium chloride SA (K-DUR,KLOR-CON) 20 MEQ tablet Take 1 tablet (20 mEq total) by mouth 2 (two) times daily. 180 tablet 3  . XARELTO 15 MG TABS tablet Take 1 tablet by mouth daily 90 tablet 1  . [DISCONTINUED] Hyoscyamine Sulfate (HYOMAX-DT) 0.375 MG TBCR Take 1 tab bid for 5 days, then prn abd pain 30 each 1   No current facility-administered medications on file prior to visit.     Allergies  Allergen Reactions  . Crestor [Rosuvastatin Calcium] Other (See Comments)    Muscle pain/aches     Objective:  BP 126/64   Pulse 65   Temp 97.8 F (36.6 C) (Oral)   Resp 17   Ht 4\' 11"  (1.499 m)   Wt 159 lb 6 oz (72.3 kg)   SpO2 96%   BMI 32.19 kg/m   Physical Exam  Constitutional: He is oriented to person, place, and time and well-developed, well-nourished, and in no distress. No distress.  Cardiovascular: Normal rate and regular rhythm.   Murmur heard. Musculoskeletal:       Right lower leg: He exhibits edema.       Left lower leg: He exhibits edema.  Neurological: He is alert and oriented to person, place, and time. GCS score is 15.  Skin: Skin is warm and dry.  Psychiatric: Mood, memory, affect and judgment normal.  Vitals reviewed.   Lab Results  Component Value Date   TSH 6.990 (H) 12/20/2016    Assessment and Plan :  1. Hypothyroidism, unspecified type - Thyroid Panel With TSH - Levothyroxine Sodium 25 MCG CAPS; Take 1 capsule (25 mcg total) by mouth daily before breakfast.  Dispense: 10 capsule; Refill: 0 - Will start medication, RTC in 4-6 weeks for f/u.   2. Function kidney decreased - Ambulatory referral to Nephrology - Kidney  function continues to decline, plan to refer to nephrology for work-up. He agrees with plan.   Mercer Pod, PA-C  [facility] [place] 02/02/2017 12:01 PM

## 2017-02-02 NOTE — Telephone Encounter (Signed)
I see 2 directions on the rx, please clarify and send in 10 pills.

## 2017-02-02 NOTE — Patient Instructions (Addendum)
Synthroid - TAKE 0.5 tablet x 6 weeks (44m). Come back and see me in 4-6 weeks for thyroid level recheck.   You will receive a phone call to schedule an appointment with nephrologist.   Thank you for coming in today. I hope you feel we met your needs.  Feel free to call [facility] if you have any questions or further requests.  Please consider signing up for MyChart if you do not already have it, as this is a great way to communicate with me.  Best,  Whitney McVey, PA-C  IF you received an x-ray today, you will receive an invoice from [facility]. Please contact [facility] at [phone] questions or concerns regarding your invoice.   IF you received labwork today, you will receive an invoice from [facility] Please contact [facility] at [phone] questions or concerns regarding your invoice.   Our billing staff will not be able to assist you with questions regarding bills from these companies.  You will be contacted with the lab results as soon as they are available. The fastest way to get your results is to activate your My Chart account. Instructions are located on the last page of this paperwork. If you have not heard from [facility] the results in 2 weeks, please contact this office.

## 2017-02-02 NOTE — Telephone Encounter (Signed)
Send order for 73mcg levothyroxine to [facility] in [place].

## 2017-02-03 LAB — THYROID PANEL WITH TSH
Free Thyroxine Index: 1.8 (ref 1.2–4.9)
T3 Uptake Ratio: 27 % (ref 24–39)
T4, Total: 6.7 ug/dL (ref 4.5–12.0)
TSH: 4.59 u[IU]/mL — ABNORMAL HIGH (ref 0.450–4.500)

## 2017-02-06 ENCOUNTER — Telehealth: Payer: Self-pay | Admitting: Physician Assistant

## 2017-02-06 DIAGNOSIS — E039 Hypothyroidism, unspecified: Secondary | ICD-10-CM

## 2017-02-06 NOTE — Telephone Encounter (Signed)
Melissa from [facility] mail order would like you to call to verify the dosage and the brand of the medication levothyroxine. States it is difference from last time.   Best contact number [phone]

## 2017-02-07 NOTE — Telephone Encounter (Signed)
See last visit 64mcg started, abn result now. Please confirm dose

## 2017-02-07 NOTE — Telephone Encounter (Signed)
I also spoke with Melissa and she wanted to know if this was and intended dosage increased to 50 mcg from 25 mcg and is it okay to dispense the mylan brand they had before on the 25 mcg Pls adivse.

## 2017-02-08 MED ORDER — LEVOTHYROXINE SODIUM 25 MCG PO CAPS: 25.0000 ug | ORAL_CAPSULE | Freq: Every day | ORAL | 0 refills | Status: DC

## 2017-02-08 NOTE — Telephone Encounter (Signed)
Maintain 25 mcg daily and come back for folow up per McVey's note. Philis Fendt, MS, PA-C 11:26 AM, 02/08/2017

## 2017-02-08 NOTE — Telephone Encounter (Signed)
Daughter advised 6 week rx [facility] and will f/u then

## 2017-02-08 NOTE — Telephone Encounter (Signed)
Please see last ov and labs and advise what dose he should be on  72JLU or 72BMB  Ant certain brand?

## 2017-02-21 ENCOUNTER — Other Ambulatory Visit: Payer: Self-pay | Admitting: Nephrology

## 2017-02-21 DIAGNOSIS — N2581 Secondary hyperparathyroidism of renal origin: Secondary | ICD-10-CM | POA: Diagnosis not present

## 2017-02-21 DIAGNOSIS — N183 Chronic kidney disease, stage 3 unspecified: Secondary | ICD-10-CM

## 2017-02-21 DIAGNOSIS — S30861A Insect bite (nonvenomous) of abdominal wall, initial encounter: Secondary | ICD-10-CM | POA: Diagnosis not present

## 2017-02-21 DIAGNOSIS — D631 Anemia in chronic kidney disease: Secondary | ICD-10-CM | POA: Diagnosis not present

## 2017-02-21 DIAGNOSIS — Z789 Other specified health status: Secondary | ICD-10-CM | POA: Diagnosis not present

## 2017-02-21 DIAGNOSIS — E669 Obesity, unspecified: Secondary | ICD-10-CM | POA: Diagnosis not present

## 2017-02-21 DIAGNOSIS — I129 Hypertensive chronic kidney disease with stage 1 through stage 4 chronic kidney disease, or unspecified chronic kidney disease: Secondary | ICD-10-CM | POA: Diagnosis not present

## 2017-02-21 DIAGNOSIS — W57XXXA Bitten or stung by nonvenomous insect and other nonvenomous arthropods, initial encounter: Secondary | ICD-10-CM | POA: Diagnosis not present

## 2017-02-28 ENCOUNTER — Ambulatory Visit
Admission: RE | Admit: 2017-02-28 | Discharge: 2017-02-28 | Disposition: A | Discharge status: Home / Self Care | Payer: PPO | Source: Ambulatory Visit | Attending: Nephrology | Admitting: Nephrology

## 2017-02-28 DIAGNOSIS — N183 Chronic kidney disease, stage 3 unspecified: Secondary | ICD-10-CM

## 2017-02-28 DIAGNOSIS — S30861A Insect bite (nonvenomous) of abdominal wall, initial encounter: Secondary | ICD-10-CM | POA: Diagnosis not present

## 2017-03-04 ENCOUNTER — Other Ambulatory Visit (HOSPITAL_COMMUNITY): Payer: Self-pay | Admitting: *Deleted

## 2017-03-05 ENCOUNTER — Ambulatory Visit (HOSPITAL_COMMUNITY)
Admission: RE | Admit: 2017-03-05 | Discharge: 2017-03-05 | Disposition: A | Discharge status: Home / Self Care | Payer: PPO | Source: Ambulatory Visit | Attending: Nephrology | Admitting: Nephrology

## 2017-03-05 DIAGNOSIS — D631 Anemia in chronic kidney disease: Secondary | ICD-10-CM | POA: Diagnosis present

## 2017-03-05 MED ORDER — FERUMOXYTOL INJECTION 510 MG/17 ML
510.0000 mg | INTRAVENOUS | Status: DC
  Administered 2017-03-05: 09:00:00 510 mg via INTRAVENOUS
  Filled 2017-03-05: qty 17

## 2017-03-05 NOTE — Discharge Instructions (Signed)

## 2017-03-11 ENCOUNTER — Other Ambulatory Visit (HOSPITAL_COMMUNITY): Payer: Self-pay | Admitting: *Deleted

## 2017-03-12 ENCOUNTER — Encounter (HOSPITAL_COMMUNITY)
Admission: RE | Admit: 2017-03-12 | Discharge: 2017-03-12 | Disposition: A | Discharge status: Home / Self Care | Payer: PPO | Source: Ambulatory Visit | Attending: Nephrology | Admitting: Nephrology

## 2017-03-12 DIAGNOSIS — D631 Anemia in chronic kidney disease: Secondary | ICD-10-CM | POA: Diagnosis present

## 2017-03-12 MED ORDER — SODIUM CHLORIDE 0.9 % IV SOLN
510.0000 mg | INTRAVENOUS | Status: DC
  Administered 2017-03-12: 09:00:00 510 mg via INTRAVENOUS
  Filled 2017-03-12: qty 17

## 2017-03-16 ENCOUNTER — Ambulatory Visit (INDEPENDENT_AMBULATORY_CARE_PROVIDER_SITE_OTHER): Payer: PPO | Admitting: Physician Assistant

## 2017-03-16 ENCOUNTER — Encounter: Payer: Self-pay | Admitting: Physician Assistant

## 2017-03-16 VITALS — BP 118/63 | HR 65 | Temp 97.6°F | Resp 16 | Ht 59.0 in | Wt 161.2 lb

## 2017-03-16 DIAGNOSIS — A048 Other specified bacterial intestinal infections: Secondary | ICD-10-CM

## 2017-03-16 DIAGNOSIS — R109 Unspecified abdominal pain: Secondary | ICD-10-CM

## 2017-03-16 DIAGNOSIS — E039 Hypothyroidism, unspecified: Secondary | ICD-10-CM | POA: Diagnosis not present

## 2017-03-16 DIAGNOSIS — D649 Anemia, unspecified: Secondary | ICD-10-CM

## 2017-03-16 DIAGNOSIS — Z13220 Encounter for screening for lipoid disorders: Secondary | ICD-10-CM

## 2017-03-16 DIAGNOSIS — R5382 Chronic fatigue, unspecified: Secondary | ICD-10-CM

## 2017-03-16 LAB — POCT CBC
Granulocyte percent: 62 %G (ref 37–80)
HCT, POC: 28.5 % — AB (ref 43.5–53.7)
Hemoglobin: 9.3 g/dL — AB (ref 14.1–18.1)
Lymph, poc: 1.9 (ref 0.6–3.4)
MCH, POC: 25.1 pg — AB (ref 27–31.2)
MCHC: 32.8 g/dL (ref 31.8–35.4)
MCV: 76.6 fL — AB (ref 80–97)
MID (cbc): 0.6 (ref 0–0.9)
MPV: 9.5 fL (ref 0–99.8)
POC Granulocyte: 4 (ref 2–6.9)
POC LYMPH PERCENT: 29.3 %L (ref 10–50)
POC MID %: 8.7 % (ref 0–12)
Platelet Count, POC: 274 10*3/uL (ref 142–424)
RBC: 3.73 M/uL — AB (ref 4.69–6.13)
RDW, POC: 25.5 %
WBC: 6.4 10*3/uL (ref 4.6–10.2)

## 2017-03-16 MED ORDER — LEVOTHYROXINE SODIUM 25 MCG PO CAPS: 25.0000 ug | ORAL_CAPSULE | Freq: Every day | ORAL | 3 refills | Status: DC

## 2017-03-16 NOTE — Progress Notes (Signed)
[name] Elliott  MRN: [id number] DOB: [birth year]  PCP: Dorise Hiss, PA-C  Subjective:  Pt is an 83 year old male who presents to clinic for thyroid check.  Last OV 6/2 - started on levothyroxine 25 mcg due to abnormal TSH levels.  No H/o thyroid disease. Has never been on medications. Today he denies medication side effects. He has no improvement of symptoms. C/o chronic fatigue and generalized weakness.   Last OV referred to nephrologist for worsening kidney function. He had his initial visit last month - US kidneys - he is unsure of the results. F/u appt Aug 28.  Iron deficiency anemia - pt states at his visit with nephrologist, he was told he had "low iron" and received IV iron infusions. Experienced leg pain after infusions.   H/o CHF - Sees cardiologist Dr. Acie Fredrickson q 3 months.   H/o bleeding stomach ulcer. Has not been evaluated in several years. Denies bloody emesis or blood in stools.    Vascular - f/u appt Aug. 6   Review of Systems  Constitutional: Positive for fatigue. Negative for chills, diaphoresis and fever.  Gastrointestinal: Positive for abdominal pain. Negative for blood in stool, diarrhea, nausea and vomiting.  Neurological: Positive for weakness.    Patient Active Problem List   Diagnosis Date Noted  . Shortness of breath 06/04/2016  . Chronic combined systolic and diastolic CHF (congestive heart failure) ([place]) 06/04/2016  . Pain in lower jaw 02/14/2016  . Abdominal pain 12/12/2015  . Bilateral leg edema 12/12/2015  . Dizziness 09/27/2015  . Primary localized osteoarthritis of left knee 07/12/2015  . Debility 02/23/2015  . Superficial hematoma 01/25/2015  . Encounter for chronic pain management 01/25/2015  . Injury of right elbow 01/13/2015  . Injury of right forearm 01/13/2015  . Inguinal lymphadenopathy 12/28/2014  . DVT (deep venous thrombosis) ([place]) 12/14/2014  . Combined congestive systolic and diastolic heart failure ([place]) 12/14/2014  .  Cerebral infarction due to unspecified mechanism   . TIA (transient ischemic attack) 08/01/2014  . Varicose veins of both legs with edema 05/07/2014  . Bilateral leg pain 04/16/2014  . Chronic kidney disease, stage III (moderate) 12/21/2013  . Vascular dementia without behavioral disturbance   . Type 2 diabetes mellitus, controlled, with renal complications ([place]) 01/77/9390  . Diastolic dysfunction 30/05/2329  . Knee pain, bilateral 03/01/2013  . Hereditary and idiopathic peripheral neuropathy 11/26/2012  . Osteoporosis, unspecified 11/26/2012  . GI bleed due to NSAIDs, DDX=Isch colitis, infectious colitis 11/19/2012  . Cough 05/29/2012  . 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 CONSTIPATION, CHRONIC 08/02/2010  . INSOMNIA, CHRONIC 03/08/2010  . Unspecified hypothyroidism 01/04/2010  . VITAMIN B12 DEFICIENCY 01/04/2010  . HYPERCHOLESTEROLEMIA 08/12/2009  . Essential hypertension 08/12/2009  . Coronary atherosclerosis of native coronary artery 08/12/2009  . GERD 08/12/2009    Current Outpatient Prescriptions on File Prior to Visit  Medication Sig Dispense Refill  . aspirin EC 81 MG tablet Take 81 mg by mouth daily.     . furosemide (LASIX) 40 MG tablet Take 1 tablet (40 mg total) by mouth daily. 90 tablet 3  . gabapentin (NEURONTIN) 300 MG capsule Take 2 capsules (600 mg total) by mouth 3 (three) times daily. 540 capsule 1  . isosorbide mononitrate (IMDUR) 30 MG 24 hr tablet Take 1 tablet (30 mg total) by mouth daily. 90 tablet 3  . Levothyroxine Sodium 25 MCG CAPS Take 1 capsule (25 mcg total) by mouth daily before breakfast. 45 capsule 0  .  loratadine (CLARITIN) 10 MG tablet Take 1 tablet (10 mg total) by mouth daily. 30 tablet 1  . magnesium hydroxide (MILK OF MAGNESIA) 400 MG/5ML suspension Take 15 mLs by mouth daily as needed for mild constipation or moderate constipation.    Marland Kitchen omeprazole (PRILOSEC) 20 MG capsule Take 1 capsule (20 mg total) by mouth 2 (two) times  daily before a meal. 180 capsule 3  . potassium chloride SA (K-DUR,KLOR-CON) 20 MEQ tablet Take 1 tablet (20 mEq total) by mouth 2 (two) times daily. 180 tablet 3  . XARELTO 15 MG TABS tablet Take 1 tablet by mouth daily 90 tablet 1  . [DISCONTINUED] Hyoscyamine Sulfate (HYOMAX-DT) 0.375 MG TBCR Take 1 tab bid for 5 days, then prn abd pain 30 each 1   No current facility-administered medications on file prior to visit.     Allergies  Allergen Reactions  . Crestor [Rosuvastatin Calcium] Other (See Comments)    Muscle pain/aches     Objective:  BP 118/63   Pulse 65   Temp 97.6 F (36.4 C) (Oral)   Resp 16   Ht 4\' 11"  (1.499 m)   Wt 161 lb 3.2 oz (73.1 kg)   SpO2 97%   BMI 32.56 kg/m   Physical Exam  Constitutional: He is oriented to person, place, and time and well-developed, well-nourished, and in no distress. No distress.  Cardiovascular: Normal rate, regular rhythm and normal heart sounds.   Pulmonary/Chest: Effort normal. He has no decreased breath sounds. He has no wheezes. He has no rhonchi. He has no rales.  Abdominal: Soft. Normal appearance and bowel sounds are normal. He exhibits no distension. There is no hepatosplenomegaly. There is tenderness in the right upper quadrant and epigastric area. There is no rebound and no guarding.  Neurological: He is alert and oriented to person, place, and time. GCS score is 15.  Skin: Skin is warm and dry.  Psychiatric: Mood, memory, affect and judgment normal.  Vitals reviewed.  Results for orders placed or performed in visit on 03/16/17  POCT CBC  Result Value Ref Range   WBC 6.4 4.6 - 10.2 K/uL   Lymph, poc 1.9 0.6 - 3.4   POC LYMPH PERCENT 29.3 10 - 50 %L   MID (cbc) 0.6 0 - 0.9   POC MID % 8.7 0 - 12 %M   POC Granulocyte 4.0 2 - 6.9   Granulocyte percent 62.0 37 - 80 %G   RBC 3.73 (A) 4.69 - 6.13 M/uL   Hemoglobin 9.3 (A) 14.1 - 18.1 g/dL   HCT, POC 03/18/17 (A) 21.6 - 53.7 %   MCV 76.6 (A) 80 - 97 fL   MCH, POC 25.1 (A)  27 - 31.2 pg   MCHC 32.8 31.8 - 35.4 g/dL   RDW, POC 24.4 %   Platelet Count, POC 274 142 - 424 K/uL   MPV 9.5 0 - 99.8 fL    69.5 Renal 02/28/2017:  IMPRESSION: 1. No hydronephrosis. 2. The renal parenchymal echogenicity is within normal limits. Assessment and Plan :    1. Hypothyroidism, unspecified type - Thyroid Panel With TSH - Levothyroxine Sodium 25 MCG CAPS; Take 1 capsule (25 mcg total) by mouth daily before breakfast.  Dispense: 45 capsule; Refill: 3 - Lab is pending. Will contact with results. Con't Levothyroxine.  2. Chronic fatigue - CMP14+EGFR  3. Anemia, unspecified type - Ferritin - Reticulocytes - Pathologist smear review - POC Hemoccult Bld/Stl (3-Cd Home Screen); Future - POCT CBC - Recent  finding of low iron from nephrologist, will follow labs. Concern for possible bleed. Hemoccult stool and H Pylori pending. Consider checking EPO level if lab work normal 4. Screening, lipid - Lipid panel  5. Abdominal pain, unspecified abdominal location - H. pylori breath test - Pending.   Mercer Pod, PA-C  [facility] [place] 03/16/2017 11:02 AM

## 2017-03-16 NOTE — Patient Instructions (Signed)
Please return to occult cards to the clinic at your convenience.  You will be contacted with today's results.  Please try to incorporate more fruits and vegetables into your daily diet.   Come back and see me in 3 months.   Thank you for coming in today. I hope you feel we met your needs.  Feel free to call [facility] if you have any questions or further requests.  Please consider signing up for MyChart if you do not already have it, as this is a great way to communicate with me.  Best,  [employer], PA-C

## 2017-03-19 ENCOUNTER — Other Ambulatory Visit: Payer: Self-pay | Admitting: Physician Assistant

## 2017-03-19 LAB — THYROID PANEL WITH TSH
Free Thyroxine Index: 2 (ref 1.2–4.9)
T3 Uptake Ratio: 25 % (ref 24–39)
T4, Total: 7.9 ug/dL (ref 4.5–12.0)
TSH: 5.29 u[IU]/mL — ABNORMAL HIGH (ref 0.450–4.500)

## 2017-03-19 LAB — FERRITIN: Ferritin: 456 ng/mL — ABNORMAL HIGH (ref 30–400)

## 2017-03-19 LAB — CMP14+EGFR
ALT: 12 IU/L (ref 0–44)
AST: 24 IU/L (ref 0–40)
Albumin/Globulin Ratio: 1.5 (ref 1.2–2.2)
Albumin: 4.4 g/dL (ref 3.5–4.7)
Alkaline Phosphatase: 52 IU/L (ref 39–117)
BUN/Creatinine Ratio: 16 (ref 10–24)
BUN: 26 mg/dL (ref 8–27)
Bilirubin Total: 0.6 mg/dL (ref 0.0–1.2)
CO2: 16 mmol/L — ABNORMAL LOW (ref 20–29)
Calcium: 9.5 mg/dL (ref 8.6–10.2)
Chloride: 98 mmol/L (ref 96–106)
Creatinine, Ser: 1.6 mg/dL — ABNORMAL HIGH (ref 0.76–1.27)
GFR calc Af Amer: 44 mL/min/{1.73_m2} — ABNORMAL LOW (ref >59)
GFR calc non Af Amer: 38 mL/min/{1.73_m2} — ABNORMAL LOW (ref >59)
Globulin, Total: 2.9 g/dL (ref 1.5–4.5)
Glucose: 95 mg/dL (ref 65–99)
Potassium: 5.3 mmol/L — ABNORMAL HIGH (ref 3.5–5.2)
Sodium: 135 mmol/L (ref 134–144)
Total Protein: 7.3 g/dL (ref 6.0–8.5)

## 2017-03-19 LAB — LIPID PANEL
Chol/HDL Ratio: 2.7 ratio (ref 0.0–5.0)
Cholesterol, Total: 167 mg/dL (ref 100–199)
HDL: 62 mg/dL (ref >39)
LDL Calculated: 89 mg/dL (ref 0–99)
Triglycerides: 79 mg/dL (ref 0–149)
VLDL Cholesterol Cal: 16 mg/dL (ref 5–40)

## 2017-03-19 LAB — H. PYLORI BREATH TEST: H pylori Breath Test: POSITIVE — AB

## 2017-03-19 MED ORDER — AMOXICILLIN 500 MG PO CAPS: 2000.0000 mg | ORAL_CAPSULE | Freq: Four times a day (QID) | ORAL | 0 refills | Status: AC

## 2017-03-19 MED ORDER — BISMUTH SUBSALICYLATE 262 MG/15ML PO SUSP: 30.0000 mL | Freq: Four times a day (QID) | ORAL | 0 refills | Status: DC | PRN

## 2017-03-19 MED ORDER — LANSOPRAZOLE 30 MG PO CPDR: 60.0000 mg | DELAYED_RELEASE_CAPSULE | Freq: Every day | ORAL | 0 refills | Status: DC

## 2017-03-19 MED ORDER — METRONIDAZOLE 500 MG PO TABS: 500.0000 mg | ORAL_TABLET | Freq: Four times a day (QID) | ORAL | 0 refills | Status: AC

## 2017-03-19 NOTE — Addendum Note (Signed)
Addended by: Dorise Hiss on: 03/19/2017 06:26 PM   Modules accepted: Orders

## 2017-03-19 NOTE — Progress Notes (Signed)
Will do 1 day treatment for H Pylori. Called and spoke to his daughter to discuss plan. Pt will RTC in 6 weeks for repeat H Pylori. Suspect this could be the cause of his anemia and increased retic count. Repeat these abnormal labs after treatment. Consider referral to GI if no improvement, considering h/o bleeding ulcers.

## 2017-03-21 LAB — PATHOLOGIST SMEAR REVIEW
Basophils Absolute: 0.1 10*3/uL (ref 0.0–0.2)
Basos: 1 %
EOS (ABSOLUTE): 0.3 10*3/uL (ref 0.0–0.4)
Eos: 5 %
Hematocrit: 30 % — ABNORMAL LOW (ref 37.5–51.0)
Hemoglobin: 9.2 g/dL — ABNORMAL LOW (ref 13.0–17.7)
Immature Grans (Abs): 0 10*3/uL (ref 0.0–0.1)
Immature Granulocytes: 0 %
Lymphocytes Absolute: 1.8 10*3/uL (ref 0.7–3.1)
Lymphs: 28 %
MCH: 24.9 pg — ABNORMAL LOW (ref 26.6–33.0)
MCHC: 30.7 g/dL — ABNORMAL LOW (ref 31.5–35.7)
MCV: 81 fL (ref 79–97)
Monocytes Absolute: 0.5 10*3/uL (ref 0.1–0.9)
Monocytes: 8 %
Neutrophils Absolute: 3.6 10*3/uL (ref 1.4–7.0)
Neutrophils: 58 %
Path Rev PLTs: NORMAL
Path Rev WBC: NORMAL
Platelets: 308 10*3/uL (ref 150–379)
RBC: 3.7 x10E6/uL — ABNORMAL LOW (ref 4.14–5.80)
RDW: 23.4 % — ABNORMAL HIGH (ref 12.3–15.4)
WBC: 6.3 10*3/uL (ref 3.4–10.8)

## 2017-03-21 LAB — RETICULOCYTES: Retic Ct Pct: 3.5 % — ABNORMAL HIGH (ref 0.6–2.6)

## 2017-03-24 ENCOUNTER — Other Ambulatory Visit: Payer: Self-pay | Admitting: Physician Assistant

## 2017-03-25 ENCOUNTER — Encounter: Payer: Self-pay | Admitting: Vascular Surgery

## 2017-03-25 ENCOUNTER — Other Ambulatory Visit: Payer: Self-pay | Admitting: *Deleted

## 2017-03-25 DIAGNOSIS — D649 Anemia, unspecified: Secondary | ICD-10-CM

## 2017-03-25 LAB — POC HEMOCCULT BLD/STL (HOME/3-CARD/SCREEN)
Card #2 Fecal Occult Blod, POC: POSITIVE
Card #3 Fecal Occult Blood, POC: POSITIVE
Fecal Occult Blood, POC: POSITIVE — AB

## 2017-04-08 ENCOUNTER — Ambulatory Visit: Payer: PPO | Admitting: Vascular Surgery

## 2017-04-09 ENCOUNTER — Other Ambulatory Visit: Payer: Self-pay | Admitting: Physician Assistant

## 2017-04-09 DIAGNOSIS — E039 Hypothyroidism, unspecified: Secondary | ICD-10-CM

## 2017-04-10 MED ORDER — LEVOTHYROXINE SODIUM 25 MCG PO CAPS: 25.0000 ug | ORAL_CAPSULE | Freq: Every day | ORAL | 3 refills | Status: DC

## 2017-04-18 DIAGNOSIS — N183 Chronic kidney disease, stage 3 (moderate): Secondary | ICD-10-CM | POA: Diagnosis not present

## 2017-04-18 DIAGNOSIS — N2581 Secondary hyperparathyroidism of renal origin: Secondary | ICD-10-CM | POA: Diagnosis not present

## 2017-04-18 DIAGNOSIS — D631 Anemia in chronic kidney disease: Secondary | ICD-10-CM | POA: Diagnosis not present

## 2017-04-30 ENCOUNTER — Ambulatory Visit (INDEPENDENT_AMBULATORY_CARE_PROVIDER_SITE_OTHER): Payer: PPO | Admitting: Cardiovascular Disease

## 2017-04-30 ENCOUNTER — Encounter: Payer: Self-pay | Admitting: Cardiovascular Disease

## 2017-04-30 VITALS — BP 108/60 | HR 69 | Ht 59.0 in | Wt 164.0 lb

## 2017-04-30 DIAGNOSIS — I480 Paroxysmal atrial fibrillation: Secondary | ICD-10-CM | POA: Insufficient documentation

## 2017-04-30 DIAGNOSIS — R531 Weakness: Secondary | ICD-10-CM | POA: Diagnosis not present

## 2017-04-30 DIAGNOSIS — I5022 Chronic systolic (congestive) heart failure: Secondary | ICD-10-CM

## 2017-04-30 DIAGNOSIS — I2510 Atherosclerotic heart disease of native coronary artery without angina pectoris: Secondary | ICD-10-CM

## 2017-04-30 MED ORDER — POTASSIUM CHLORIDE CRYS ER 20 MEQ PO TBCR
20.0000 meq | EXTENDED_RELEASE_TABLET | ORAL | 3 refills | Status: DC
Start: 2017-04-30 — End: 2017-12-24

## 2017-04-30 MED ORDER — FUROSEMIDE 40 MG PO TABS: 40.0000 mg | ORAL_TABLET | ORAL | 3 refills | Status: DC

## 2017-04-30 NOTE — Patient Instructions (Addendum)
Medication Instructions:  DECREASE Kdur (potassium) and Lasix (Furosemide) to every other day   Labwork: Your physician recommends that you return for lab work in: 3 weeks for basic metabolic panel    Testing/Procedures: None Ordered   Follow-Up: Your physician wants you to follow-up in: 6 months with Dr. Acie Fredrickson.  You will receive a reminder letter in the mail two months in advance. If you don't receive a letter, please call our office to schedule the follow-up appointment.   If you need a refill on your cardiac medications before your next appointment, please call your pharmacy.   Thank you for choosing [facility]! Christen Bame, RN [phone]

## 2017-04-30 NOTE — Progress Notes (Signed)
Cardiology Office Note   Date:  04/30/2017   ID:  Raj Landress, DOB 1928-03-17, MRN [id number]  PCP:  Dorise Hiss, PA-C  Cardiologist:   Mertie Moores, MD   Chief Complaint  Patient presents with  . Coronary Artery Disease  . Congestive Heart Failure   Problem list:  1. Moderate coronary artery disease 2. Dementia 3. Peripheral vascular disease 4. CVA 5.   History of Present Illness:  83 year old gentleman with a history of moderate diffuse coronary artery disease. We treated medically. The left anterior descending arteries/ 1st diagonal stenosis has not was not suitable for PCI.  He complains of being sick in general is weak sensation. He also complains of some left arm pain. When I saw him last month. He was having some episodes of chest pain. We were trying to decide whether or not these were due to angina. I asked him to take nitroglycerin when he had these episodes of pain. At times the nitroglycerin helps him and at other times the nitroglycerin did not do anything. When he walks up a hill in his backyard he has significant shortness breath and this "sick feeling".  He has had a partial shoulder replacement for a shoulder fracture. He has had chronic shoulder pain since that time.  He has chronic knee pain and is considering knee surgery. His office visit was for the purpose of preoperative evaluation.  Sept. 17, 2013- He has no cardiac complaints. He denies any chest pain or dyspnea. He still has some left shoulder stiffness from his surgery February 05, 2012. He has had some wheezing and was recently started on steroids and an antibiotic.  He also ws diagnosed with peripheral neurophy He was prescribed gabapentin but his daughter did not fill the medication because it was too expensive.  She bought some over-the-counter vitamin B12 which seems to be helping a little bit.  November 26, 2012:  Basheer was recently hospitalized last week for peptic  ulcer Disease. He had vomiting of coffee-ground emesis. He also had blood in his stool. [facility] discontinued his Gabriel Earing powders and his Fosamax. He seems to be doing well from a cardiac standpoint. No angina.  He is having trouble with leg pain.   Jan. 20, 2015:  No CP. He has lots of fatigue especially if he tries to walk any distance.  April 21, 2014,   November 02, 2014;  Pamela Maddy is a 83 y.o. male who presents for his coronary artery disease.   He was seen by Richardson Dopp several weeks ago. He had a stroke back in November. We've placed a event monitor looking for atrial fib . There was no evidence of atrial fibrillation on the monitor. There is some mention of atrial fibrillation in past charts but we've not been able to locate any confirmation of that.  He complains of leg pain today.   He keeps his legs elevated 3-4 hours a day.  Has seen [facility] and they had no solutions for his leg pain.    No additional stroke symptoms.  He was seen with his granddaughter this am.  Aug. 30, 2016:  He has been diagnosed with DVTs.  Has been on xarelto. Needs to have knee  Replacement .   Feb. 28, 2017: Still having lot of left leg swelling from his left TKA . No CP . Breathing is ok Unable to walk very far at all   Oct. 2, 2017:  Is having some shortness of breath  -  especially with any exertion  Last echo was 08/02/14 and showed LVEF of 45-50%. Grade 1 DD.  Mild - mod MR  Eats lots of salty foods according to family Has some leg swelling   Jan 01, 2017:  Chester is seen today for follow up visit atrial fib, cad, and chronic combined CHF  Has been having lots of dizziness and shortness of breath recently .   Was seen at [facility] He gets dizzy,  Has profound leg weakness.  At times he has orthostatic hypotension   Has headaches.   Reported some chest pain last week that worsened when he moved his arm    04/30/2017:  [name] Elliott is seen today for follow-up visit. He  does not have any cardiac complaints. He complains of feeling very weak and not having any energy. His blood pressure has been low and he has had orthostatic hypotension in the past. He has already stopped taking isosorbide mononitrate.   Past Medical History:  Diagnosis Date  . Arthritis   . Arthropathy, unspecified, site unspecified   . Atrial fibrillation ([place])   . Blood dyscrasia    thromboctopenia pt family states he was never told of this  . CAD (coronary artery disease)    last cath in 2012. Managed medically-some blockages  . Chronic lower back pain   . Chronic systolic CHF (congestive heart failure) ([place]) 06/04/2016  . Constipation, chronic   . Diabetes mellitus without complication ([place])    pt states he doesn't have  . DVT (deep venous thrombosis) ([place])   . Dysrhythmia   . Esophageal reflux   . 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 MRI Nov '11 - tendonosis and partial tear. Left shoulder surgery Jan '12 for partial shoulder replacement. [name])   . Headache(784.0)   . History of stomach ulcers 11/2012  . Hypothyroidism    pt states he doeen't have   . Orthostasis   . Other B-complex deficiencies   . Other malaise and fatigue   . Pain in joint, shoulder region    Has chronic shoulder pain  . Pain in limb   . Persistent disorder of initiating or maintaining sleep   . Pneumonia 1980's?; 2011  . Primary localized osteoarthritis of left knee 07/12/2015  . Pure hypercholesterolemia   . PVC's (premature ventricular contractions)   . S/P cardiac cath 08/08/11   mild to moderate CAD primarily in the LAD. None are obstructive and appear stable from prior cath in 2007; managed medically  . Sebaceous cyst   . Stroke ([place])   . Thrombocytopenia, unspecified ([place])   . Unspecified essential hypertension   . Vascular dementia without behavioral disturbance    with periods of amnesia    Past Surgical History:  Procedure Laterality Date  . BACK  SURGERY    . CARDIAC CATHETERIZATION  08/2011  . CATARACT EXTRACTION Left 2009  . COLONOSCOPY    . ESOPHAGOGASTRODUODENOSCOPY N/A 11/19/2012   Procedure: ESOPHAGOGASTRODUODENOSCOPY (EGD);  Surgeon: Jerene Bears, MD;  Location: [facility];  Service: Gastroenterology;  Laterality: N/A;  . FINGER AMPUTATION Right    pinky finger  . HAND SURGERY Right    crush injury, right fifth digit contracture, limited  motion  . HARDWARE REMOVAL  02/05/2012   Procedure: HARDWARE REMOVAL;  Surgeon: Johnny Bridge, MD;  Location: [facility];  Service: Orthopedics;  Laterality: Left;  . KNEE SURGERY Left 1991   "did it twice in 1 wk" (02/17/2013)  . LUMBAR SPINE  SURGERY  2007   Dr  Aspen ([place])  . REVERSE SHOULDER ARTHROPLASTY  02/05/2012   Procedure: REVERSE SHOULDER ARTHROPLASTY;  Surgeon: Johnny Bridge, MD;  Location: [facility];  Service: Orthopedics;  Laterality: Left;  . SHOULDER HEMI-ARTHROPLASTY    . SHOULDER OPEN ROTATOR CUFF REPAIR Left 8.30.2011  . TOTAL KNEE ARTHROPLASTY Left 07/12/2015   Procedure: TOTAL KNEE ARTHROPLASTY;  Surgeon: Marchia Bond, MD;  Location: [facility];  Service: Orthopedics;  Laterality: Left;  . US ECHOCARDIOGRAPHY  02-28-2010   Est EF 50-55%     Current Outpatient Prescriptions  Medication Sig Dispense Refill  . aspirin EC 81 MG tablet Take 81 mg by mouth daily.     Marland Kitchen bismuth subsalicylate (PEPTO-BISMOL) 262 MG/15ML suspension Take 30 mLs by mouth every 6 (six) hours as needed. 360 mL 0  . furosemide (LASIX) 40 MG tablet Take 1 tablet (40 mg total) by mouth daily. 90 tablet 3  . gabapentin (NEURONTIN) 300 MG capsule Take 2 capsules (600 mg total) by mouth 3 (three) times daily. 540 capsule 1  . lansoprazole (PREVACID) 30 MG capsule Take 2 capsules (60 mg total) by mouth daily at 12 noon. 2 capsule 0  . Levothyroxine Sodium 25 MCG CAPS Take 1 capsule (25 mcg total) by mouth daily before breakfast. 45 capsule 3  . loratadine (CLARITIN) 10 MG tablet Take 1 tablet (10 mg  total) by mouth daily. 30 tablet 1  . magnesium hydroxide (MILK OF MAGNESIA) 400 MG/5ML suspension Take 15 mLs by mouth daily as needed for mild constipation or moderate constipation.    Marland Kitchen omeprazole (PRILOSEC) 20 MG capsule Take 1 capsule (20 mg total) by mouth 2 (two) times daily before a meal. 180 capsule 3  . potassium chloride SA (K-DUR,KLOR-CON) 20 MEQ tablet Take 1 tablet (20 mEq total) by mouth 2 (two) times daily. 180 tablet 3  . XARELTO 15 MG TABS tablet Take 1 tablet by mouth daily 90 tablet 1  . isosorbide mononitrate (IMDUR) 30 MG 24 hr tablet Take 1 tablet (30 mg total) by mouth daily. (Patient not taking: Reported on 04/30/2017) 90 tablet 3   No current facility-administered medications for this visit.     Allergies:   Crestor [rosuvastatin calcium]    Social History:  The patient  reports that he quit smoking about 38 years ago. His smoking use included Cigars. His smokeless tobacco use includes Chew. He reports that he does not drink alcohol or use drugs.   Family History:  The patient's family history includes Breast cancer in his mother; Cancer in his brother and mother; Diabetes in his daughter, mother, and sister; Kidney disease in his father; Stroke in his brother.    ROS:  Please see the history of present illness.    Review of Systems: Constitutional:  denies fever, chills, diaphoresis, appetite change and fatigue.  HEENT: denies photophobia, eye pain, redness, hearing loss, ear pain, congestion, sore throat, rhinorrhea, sneezing, neck pain, neck stiffness and tinnitus.  Respiratory: denies SOB, DOE, cough, chest tightness, and wheezing.  Cardiovascular: denies chest pain, palpitations and leg swelling.  Gastrointestinal: denies nausea, vomiting, abdominal pain, diarrhea, constipation, blood in stool.  Genitourinary: denies dysuria, urgency, frequency, hematuria, flank pain and difficulty urinating.  Musculoskeletal: admits to  Leg pain    Skin: admits to  Wound on  his legs   Neurological: denies dizziness, seizures, syncope, weakness, light-headedness, numbness and headaches.   Hematological: denies adenopathy, easy bruising, personal or family bleeding history.  Psychiatric/ Behavioral: denies  suicidal ideation, mood changes, confusion, nervousness, sleep disturbance and agitation.       All other systems are reviewed and negative.    PHYSICAL EXAM: VS:  BP 108/60   Pulse 69   Ht 4\' 11"  (1.499 m)   Wt 164 lb (74.4 kg)   SpO2 95%   BMI 33.12 kg/m  , BMI Body mass index is 33.12 kg/m. GEN: Well nourished, well developed, in no acute distress  HEENT: normal  Neck: no JVD, carotid bruits, or masses Cardiac: RRR; soft systolic murmur  rubs, or gallops, 1+ - 2+  bilateral leg edema  Respiratory:  clear to auscultation bilaterally, normal work of breathing GI: soft, nontender, nondistended, + BS MS: legs have hair loss.    Skin: warm and dry, no rash Neuro:  Moves slow.  Was able to get up on the table himself Psych: ? Mild dementia , slow speech    EKG:    Recent Labs: 12/20/2016: B Natriuretic Peptide 121.0 03/16/2017: ALT 12; BUN 26; Creatinine, Ser 1.60; Hemoglobin 9.3; Platelets 308; Potassium 5.3; Sodium 135; TSH 5.290   Lipid Panel    Component Value Date/Time   CHOL 167 03/16/2017 1110   TRIG 79 03/16/2017 1110   HDL 62 03/16/2017 1110   CHOLHDL 2.7 03/16/2017 1110   CHOLHDL 2.5 06/04/2016 0857   VLDL 9 06/04/2016 0857   LDLCALC 89 03/16/2017 1110   LDLDIRECT 140.6 09/25/2013 1030      Wt Readings from Last 3 Encounters:  04/30/17 164 lb (74.4 kg)  03/16/17 161 lb 3.2 oz (73.1 kg)  03/12/17 151 lb (68.5 kg)      Other studies Reviewed: Additional studies/ records that were reviewed today include:  Review of the above records demonstrates:    ASSESSMENT AND PLAN:  1.  - Generalized weakness: Gianpaolo continues to complain of generalized weakness. His blood pressure is borderline low. He has already stopped taking  the isosorbide. We will decrease his Lasix 40 mg  QOD  and potassium to 20 meq  every other day. We'll see if this helps bring his blood pressure often helps with his low energy levels.  2. Moderate coronary artery disease - he's not having any episodes of angina.    3. Peripheral vascular disease- he has chronic leg pain. He has seen Dr. Trula Slade and there is not much more that can be done for his leg pain.   4. Paroxysmal atrial fib:  Continue xarleto 15 mg a day    4. CVA - he's not had any recurrent strokes. There was no evidence of atrial fib on the 30 day event monitor. We'll continue to follow.  5. DVT:  He was found have a deep vein thrombosis.  He is now on Xarelto. I've cautioned the patient and his family that he should be very careful and avoid falls.    6. Knee arthritis: The patient may need to have knee replacement. I have told him that he would be at moderate to high risk for knee replacement given his heart disease and peripheral vascular disease and underlying medical issues.  7. Chronic combined systolic and diastolic congestive heart failure :    He still eats lots of salt. Discussed with family . He should decrease his salt intake   is breathing appears to be stable    Current medicines are reviewed at length with the patient today.  The patient does not have concerns regarding medicines.  The following changes have been made:  no  change   Disposition:   FU with me in 6  months      Mertie Moores, MD  04/30/2017 4:14 PM    [facility] [address], [place], [place]  [address] Phone: [phone]; Fax: [phone]

## 2017-05-14 DIAGNOSIS — D631 Anemia in chronic kidney disease: Secondary | ICD-10-CM | POA: Diagnosis not present

## 2017-05-14 DIAGNOSIS — E669 Obesity, unspecified: Secondary | ICD-10-CM | POA: Diagnosis not present

## 2017-05-14 DIAGNOSIS — I129 Hypertensive chronic kidney disease with stage 1 through stage 4 chronic kidney disease, or unspecified chronic kidney disease: Secondary | ICD-10-CM | POA: Diagnosis not present

## 2017-05-14 DIAGNOSIS — N2581 Secondary hyperparathyroidism of renal origin: Secondary | ICD-10-CM | POA: Diagnosis not present

## 2017-05-14 DIAGNOSIS — N183 Chronic kidney disease, stage 3 (moderate): Secondary | ICD-10-CM | POA: Diagnosis not present

## 2017-05-20 ENCOUNTER — Telehealth: Payer: Self-pay | Admitting: Cardiovascular Disease

## 2017-05-20 NOTE — Telephone Encounter (Signed)
Attempted to call patient's caretaker back x 2, no answer.  Left message for return call to office.

## 2017-05-20 NOTE — Telephone Encounter (Signed)
New message    Pt daughter is calling about pt. She said pt medication was changed at his last visit here, and when they went to another appt his other doctor changed it back to the way he was taking it because his legs were swollen. She wants to know if pt needs to come in for lab work tomorrow. Please call.

## 2017-05-21 ENCOUNTER — Other Ambulatory Visit: Payer: PPO

## 2017-05-21 DIAGNOSIS — I480 Paroxysmal atrial fibrillation: Secondary | ICD-10-CM | POA: Diagnosis not present

## 2017-05-21 DIAGNOSIS — I2510 Atherosclerotic heart disease of native coronary artery without angina pectoris: Secondary | ICD-10-CM

## 2017-05-21 DIAGNOSIS — I5022 Chronic systolic (congestive) heart failure: Secondary | ICD-10-CM

## 2017-05-21 DIAGNOSIS — R531 Weakness: Secondary | ICD-10-CM

## 2017-05-22 LAB — BASIC METABOLIC PANEL
BUN/Creatinine Ratio: 15 (ref 10–24)
BUN: 30 mg/dL — AB (ref 8–27)
CALCIUM: 9.2 mg/dL (ref 8.6–10.2)
CO2: 23 mmol/L (ref 20–29)
CREATININE: 1.94 mg/dL — AB (ref 0.76–1.27)
Chloride: 98 mmol/L (ref 96–106)
GFR calc Af Amer: 35 mL/min/{1.73_m2} — ABNORMAL LOW (ref >59)
GFR, EST NON AFRICAN AMERICAN: 30 mL/min/{1.73_m2} — AB (ref >59)
Glucose: 196 mg/dL — ABNORMAL HIGH (ref 65–99)
Potassium: 3.8 mmol/L (ref 3.5–5.2)
Sodium: 138 mmol/L (ref 134–144)

## 2017-05-24 ENCOUNTER — Telehealth: Payer: Self-pay | Admitting: *Deleted

## 2017-05-24 NOTE — Telephone Encounter (Signed)
-----   Message from Thayer Headings, MD sent at 05/22/2017  1:37 PM EDT ----- Labs are basically stable .  His renal function has gradually worsened over the past year.

## 2017-05-28 NOTE — Telephone Encounter (Signed)
Left message for patient to call back; advised that we have attempted multiple times to return call placed last week and to report lab results

## 2017-05-30 NOTE — Telephone Encounter (Signed)
Closing encounter due to no return call from patient

## 2017-05-31 ENCOUNTER — Encounter: Payer: Self-pay | Admitting: Nurse Practitioner

## 2017-05-31 NOTE — Telephone Encounter (Signed)
An attempt to call patient was made on 05/28/17 by Christen Bame and message was left for patient to call office. Awaiting return call from patient.

## 2017-06-13 ENCOUNTER — Telehealth: Payer: Self-pay | Admitting: Cardiovascular Disease

## 2017-06-13 NOTE — Telephone Encounter (Signed)
Notes recorded by Nahser, Wonda Cheng, MD on 05/22/2017 at 1:37 PM EDT Labs are basically stable . His renal function has gradually worsened over the past year.  Spoke with pt's daughter and informed her of kidney function results worsening over the past year per Dr. Acie Fredrickson.   Daughter verbalized understanding.

## 2017-06-13 NOTE — Telephone Encounter (Signed)
Mrs. Joneen Caraway ( Daughter) is calling to get lab results

## 2017-06-13 NOTE — Telephone Encounter (Signed)
Notes recorded by Nahser, Wonda Cheng, MD on 05/22/2017 at 1:37 PM EDT Labs are basically stable . His renal function has gradually worsened over the past year.

## 2017-06-25 ENCOUNTER — Other Ambulatory Visit (HOSPITAL_COMMUNITY): Payer: Self-pay

## 2017-06-26 ENCOUNTER — Encounter (HOSPITAL_COMMUNITY): Payer: PPO

## 2017-07-02 ENCOUNTER — Encounter (HOSPITAL_COMMUNITY)
Admission: RE | Admit: 2017-07-02 | Discharge: 2017-07-02 | Disposition: A | Discharge status: Home / Self Care | Payer: PPO | Source: Ambulatory Visit | Attending: Nephrology | Admitting: Nephrology

## 2017-07-02 DIAGNOSIS — D631 Anemia in chronic kidney disease: Secondary | ICD-10-CM | POA: Diagnosis present

## 2017-07-02 DIAGNOSIS — N189 Chronic kidney disease, unspecified: Secondary | ICD-10-CM | POA: Insufficient documentation

## 2017-07-02 MED ORDER — SODIUM CHLORIDE 0.9 % IV SOLN
510.0000 mg | INTRAVENOUS | Status: DC
  Administered 2017-07-02: 10:00:00 510 mg via INTRAVENOUS
  Filled 2017-07-02: qty 17

## 2017-07-09 ENCOUNTER — Ambulatory Visit (HOSPITAL_COMMUNITY)
Admission: RE | Admit: 2017-07-09 | Discharge: 2017-07-09 | Disposition: A | Discharge status: Home / Self Care | Payer: PPO | Source: Ambulatory Visit | Attending: Nephrology | Admitting: Nephrology

## 2017-07-09 DIAGNOSIS — N189 Chronic kidney disease, unspecified: Secondary | ICD-10-CM | POA: Insufficient documentation

## 2017-07-09 DIAGNOSIS — D631 Anemia in chronic kidney disease: Secondary | ICD-10-CM | POA: Diagnosis not present

## 2017-07-09 MED ORDER — SODIUM CHLORIDE 0.9 % IV SOLN
510.0000 mg | INTRAVENOUS | Status: DC
  Administered 2017-07-09: 13:00:00 510 mg via INTRAVENOUS
  Filled 2017-07-09: qty 17

## 2017-07-11 ENCOUNTER — Other Ambulatory Visit: Payer: Self-pay

## 2017-07-11 ENCOUNTER — Other Ambulatory Visit: Payer: Self-pay | Admitting: Cardiovascular Disease

## 2017-07-11 DIAGNOSIS — I2510 Atherosclerotic heart disease of native coronary artery without angina pectoris: Secondary | ICD-10-CM

## 2017-07-12 ENCOUNTER — Other Ambulatory Visit: Payer: Self-pay | Admitting: Cardiovascular Disease

## 2017-07-12 DIAGNOSIS — I2510 Atherosclerotic heart disease of native coronary artery without angina pectoris: Secondary | ICD-10-CM

## 2017-07-12 MED ORDER — RIVAROXABAN 15 MG PO TABS: 15.0000 mg | ORAL_TABLET | Freq: Every day | ORAL | 3 refills | Status: DC

## 2017-07-12 NOTE — Telephone Encounter (Signed)
LMOM for the pt to callback regarding which pharmacy his Xarelto 15mg  should be sent to; request received from [facility] earlier today & sent and now we have received another request from [employer]. Before sending again, need to clarify correct pharmacy. Will await response.

## 2017-07-15 NOTE — Telephone Encounter (Signed)
Copied from [place] [id number]. Topic: Quick Communication - See Telephone Encounter >> Jul 15, 2017  8:46 AM Ebony Hail wrote: CRM for notification. See Telephone encounter for: calling to confirm dosage change and manufacture change confirmation on Levothyroxine Sodium 25 MCG   [phone] [facility]   07/15/17.

## 2017-07-15 NOTE — Telephone Encounter (Signed)
Copied from [place] [id number]. Topic: Quick Communication - See Telephone Encounter >> Jul 15, 2017  8:46 AM Ebony Hail wrote: CRM for notification. See Telephone encounter for: calling to confirm dosage change and manufacture change confirmation on Levothyroxine Sodium 25 MCG   [phone] [facility]   07/15/17.

## 2017-07-16 ENCOUNTER — Telehealth: Payer: Self-pay | Admitting: Physician Assistant

## 2017-07-16 ENCOUNTER — Encounter: Payer: Self-pay | Admitting: Physician Assistant

## 2017-07-16 NOTE — Telephone Encounter (Signed)
Please advise 

## 2017-07-16 NOTE — Telephone Encounter (Signed)
This encounter was created in error - please disregard.

## 2017-07-16 NOTE — Telephone Encounter (Signed)
See telephone encounter.

## 2017-07-22 NOTE — Telephone Encounter (Signed)
See phone note dated 07/16/17.

## 2017-07-23 ENCOUNTER — Other Ambulatory Visit: Payer: Self-pay | Admitting: Pharmacist

## 2017-07-23 MED ORDER — APIXABAN 5 MG PO TABS: 5.0000 mg | ORAL_TABLET | Freq: Two times a day (BID) | ORAL | 0 refills | Status: DC

## 2017-07-23 NOTE — Telephone Encounter (Signed)
Spoke with patient's daughter who states pt has run out of Xarelto and pt is in the donut hole and refuses to pick up any more Xarelto. Since we are 5 weeks away from insurance resetting, will get creative to keep pt on DOAC rather than switching to Coumadin. We have 1 bottle of Xarelto 15mg  tablets left which pt will pick up and use. Also sent rx to local pharmacy for Eliquis so that pt can use 1 month free supply. He will need the 2.5mg  dose however 1 month supply of this will have pt fall short of new year. Sent rx in for 5mg  BID tablet but advised pt to cut tablets in half and take 2.5mg  BID so that the 1 month supply will last him 2 months. Sent in Xarelto refill to mail order which will be placed on hold and pt can call when he needs this refilled in 2019 year. Pt's daughter verbalized understanding and will help pt with his medications.

## 2017-07-23 NOTE — Telephone Encounter (Signed)
Pt should be taking Levothyroxine 25 MCG CAPS; Take 1 capsule (25 mcg total) by mouth daily before breakfast.  No preference on brand. Thank you!

## 2017-07-24 NOTE — Telephone Encounter (Signed)
Spoke with pt wife this morning to get clarification on how he was taking his Levothyroxine and she informed me that he was taking 1 tablet a day. Informed her that that was the correct dose and that if she has anymore questions to give [place] a call back here at the office. She verbalized understanding.

## 2017-08-21 ENCOUNTER — Other Ambulatory Visit: Payer: Self-pay | Admitting: Physician Assistant

## 2017-08-21 DIAGNOSIS — G609 Hereditary and idiopathic neuropathy, unspecified: Secondary | ICD-10-CM

## 2017-08-21 NOTE — Telephone Encounter (Signed)
Requesting Gabapentin refill  LOV 03/16/17 with Dr. Magda Kiel

## 2017-08-31 ENCOUNTER — Ambulatory Visit (INDEPENDENT_AMBULATORY_CARE_PROVIDER_SITE_OTHER): Payer: PPO | Admitting: Family Medicine

## 2017-08-31 ENCOUNTER — Encounter: Payer: Self-pay | Admitting: Family Medicine

## 2017-08-31 ENCOUNTER — Ambulatory Visit (INDEPENDENT_AMBULATORY_CARE_PROVIDER_SITE_OTHER): Payer: PPO

## 2017-08-31 VITALS — BP 126/73 | HR 64 | Temp 97.9°F | Resp 16 | Ht 60.0 in | Wt 156.2 lb

## 2017-08-31 DIAGNOSIS — Y92009 Unspecified place in unspecified non-institutional (private) residence as the place of occurrence of the external cause: Secondary | ICD-10-CM | POA: Diagnosis not present

## 2017-08-31 DIAGNOSIS — R0781 Pleurodynia: Secondary | ICD-10-CM | POA: Diagnosis not present

## 2017-08-31 DIAGNOSIS — W19XXXA Unspecified fall, initial encounter: Secondary | ICD-10-CM

## 2017-08-31 DIAGNOSIS — S299XXA Unspecified injury of thorax, initial encounter: Secondary | ICD-10-CM | POA: Diagnosis not present

## 2017-08-31 DIAGNOSIS — Z7901 Long term (current) use of anticoagulants: Secondary | ICD-10-CM

## 2017-08-31 NOTE — Progress Notes (Signed)
Chief Complaint  Patient presents with  . Fall    Right rib pain, short winded walking long distance. Dog pulled him down.  Marland Kitchen Headache    After fall. fell in grass    HPI  Pt was walking the dog 2 weeks ago and the dog (an 80 pound lab) that took off and pulled him down He fell onto grass and hit his head, his right hip and thigh His daughter, [name], reports that she is just finding out about it He walks with a cane at baseline  He was not using his cane when he was walking the dog He states that he normally walks the dog 3-4 times a day    Past Medical History:  Diagnosis Date  . Arthritis   . Arthropathy, unspecified, site unspecified   . Atrial fibrillation ([place])   . Blood dyscrasia    thromboctopenia pt family states he was never told of this  . CAD (coronary artery disease)    last cath in 2012. Managed medically-some blockages  . Chronic lower back pain   . Chronic systolic CHF (congestive heart failure) ([place]) 06/04/2016  . Constipation, chronic   . Diabetes mellitus without complication ([place])    pt states he doesn't have  . DVT (deep venous thrombosis) ([place])   . Dysrhythmia   . Esophageal reflux   . 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 MRI Nov '11 - tendonosis and partial tear. Left shoulder surgery Jan '12 for partial shoulder replacement. [name])   . Headache(784.0)   . History of stomach ulcers 11/2012  . Hypothyroidism    pt states he doeen't have   . Orthostasis   . Other B-complex deficiencies   . Other malaise and fatigue   . Pain in joint, shoulder region    Has chronic shoulder pain  . Pain in limb   . Persistent disorder of initiating or maintaining sleep   . Pneumonia 1980's?; 2011  . Primary localized osteoarthritis of left knee 07/12/2015  . Pure hypercholesterolemia   . PVC's (premature ventricular contractions)   . S/P cardiac cath 08/08/11   mild to moderate CAD primarily in the LAD. None are obstructive and  appear stable from prior cath in 2007; managed medically  . Sebaceous cyst   . Stroke ([place])   . Thrombocytopenia, unspecified ([place])   . Unspecified essential hypertension   . Vascular dementia without behavioral disturbance    with periods of amnesia    Current Outpatient Medications  Medication Sig Dispense Refill  . aspirin EC 81 MG tablet Take 81 mg by mouth daily.     Marland Kitchen bismuth subsalicylate (PEPTO-BISMOL) 262 MG/15ML suspension Take 30 mLs by mouth every 6 (six) hours as needed. 360 mL 0  . gabapentin (NEURONTIN) 300 MG capsule Take 1 capsule (300 mg total) by mouth 3 (three) times daily. Needs office visit for additional refills 540 capsule 0  . lansoprazole (PREVACID) 30 MG capsule Take 2 capsules (60 mg total) by mouth daily at 12 noon. 2 capsule 0  . Levothyroxine Sodium 25 MCG CAPS Take 1 capsule (25 mcg total) by mouth daily before breakfast. 45 capsule 3  . loratadine (CLARITIN) 10 MG tablet Take 1 tablet (10 mg total) by mouth daily. 30 tablet 1  . magnesium hydroxide (MILK OF MAGNESIA) 400 MG/5ML suspension Take 15 mLs by mouth daily as needed for mild constipation or moderate constipation.    Marland Kitchen omeprazole (PRILOSEC) 20 MG capsule Take 1 capsule (20  mg total) by mouth 2 (two) times daily before a meal. 180 capsule 3  . potassium chloride SA (K-DUR,KLOR-CON) 20 MEQ tablet Take 1 tablet (20 mEq total) by mouth every other day. 90 tablet 3  . XARELTO 15 MG TABS tablet Take 1 tablet by mouth once daily 90 tablet 1  . furosemide (LASIX) 40 MG tablet Take 1 tablet (40 mg total) by mouth every other day. 90 tablet 3   No current facility-administered medications for this visit.     Allergies:  Allergies  Allergen Reactions  . Crestor [Rosuvastatin Calcium] Other (See Comments)    Muscle pain/aches    Past Surgical History:  Procedure Laterality Date  . BACK SURGERY    . CARDIAC CATHETERIZATION  08/2011  . CATARACT EXTRACTION Left 2009  . COLONOSCOPY    .  ESOPHAGOGASTRODUODENOSCOPY N/A 11/19/2012   Procedure: ESOPHAGOGASTRODUODENOSCOPY (EGD);  Surgeon: Jerene Bears, MD;  Location: [facility];  Service: Gastroenterology;  Laterality: N/A;  . FINGER AMPUTATION Right    pinky finger  . HAND SURGERY Right    crush injury, right fifth digit contracture, limited  motion  . HARDWARE REMOVAL  02/05/2012   Procedure: HARDWARE REMOVAL;  Surgeon: Johnny Bridge, MD;  Location: [facility];  Service: Orthopedics;  Laterality: Left;  . KNEE SURGERY Left 1991   "did it twice in 1 wk" (02/17/2013)  . LUMBAR SPINE SURGERY  2007   Dr Philip Aspen ([place])  . REVERSE SHOULDER ARTHROPLASTY  02/05/2012   Procedure: REVERSE SHOULDER ARTHROPLASTY;  Surgeon: Johnny Bridge, MD;  Location: [facility];  Service: Orthopedics;  Laterality: Left;  . SHOULDER HEMI-ARTHROPLASTY    . SHOULDER OPEN ROTATOR CUFF REPAIR Left 8.30.2011  . TOTAL KNEE ARTHROPLASTY Left 07/12/2015   Procedure: TOTAL KNEE ARTHROPLASTY;  Surgeon: Marchia Bond, MD;  Location: [facility];  Service: Orthopedics;  Laterality: Left;  . US ECHOCARDIOGRAPHY  02-28-2010   Est EF 50-55%    Social History   Socioeconomic History  . Marital status: Widowed    Spouse name: None  . Number of children: 2  . Years of education: None  . Highest education level: None  Social Needs  . Financial resource strain: None  . Food insecurity - worry: None  . Food insecurity - inability: None  . Transportation needs - medical: None  . Transportation needs - non-medical: None  Occupational History  . Occupation: retired    Fish farm manager: RETIRED  Tobacco Use  . Smoking status: Former Smoker    Types: Cigars    Last attempt to quit: 09/03/1978    Years since quitting: 39.0  . Smokeless tobacco: Current User    Types: Chew  . Tobacco comment: 02/17/2013 "I aien't smoked a cigar in 20-30 yr"  Substance and Sexual Activity  . Alcohol use: No    Alcohol/week: 0.0 oz  . Drug use: No  . Sexual activity: No  Other Topics Concern    . None  Social History Narrative   2nd grade education. Married '58, '95. 2 dtr '59, '64, youngest daughter died septic kidney.    Lives alone, handles all ADLs      Has living will   Daughter [name] is health care POA   Would accept resuscitation attempts--- but no prolonged ventilation.   Not sure about tube feeds.             Family History  Problem Relation Age of Onset  . Breast cancer Mother   . Cancer Mother   .  Diabetes Mother   . Cancer Brother        throat  . Stroke Brother   . Kidney disease Father   . Diabetes Sister   . Diabetes Daughter   . Colon cancer Neg Hx   . Heart attack Neg Hx      ROS Review of Systems See HPI Constitution: No fevers or chills No malaise No diaphoresis Skin: No rash or itching Eyes: no blurry vision, no double vision GU: no dysuria or hematuria Neuro: no dizziness or headaches all others reviewed and negative   Objective: Vitals:   08/31/17 1421  BP: 126/73  Pulse: 64  Resp: 16  Temp: 97.9 F (36.6 C)  TempSrc: Oral  SpO2: 99%  Weight: 156 lb 3.2 oz (70.9 kg)  Height: 5' (1.524 m)    Physical Exam  Constitutional: He appears well-developed and well-nourished. No distress.  HENT:  Head: Normocephalic and atraumatic.  Eyes: EOM are normal. Pupils are equal, round, and reactive to light.  Cardiovascular: Normal rate, regular rhythm, normal heart sounds and intact distal pulses.  No murmur heard. Pulmonary/Chest: Effort normal and breath sounds normal. No stridor. No respiratory distress. He has no wheezes.  Abdominal: Soft. Bowel sounds are normal. He exhibits no distension and no mass. There is no tenderness. There is no guarding.        IMPRESSION: 1. No evidence of a right rib fracture. Should the patient's symptoms persist or worsen, repeat radiographs of the ribs in 10 - 14 days maybe of use to detect subtle nondisplaced rib fractures (which are commonly occult on initial imaging). 2. No acute  cardiopulmonary disease. 3. Stable chronic nonspecific reticulation at the lung bases.   Electronically Signed   By: Ilona Sorrel M.D.   On: 08/31/2017 15:13  Assessment and Plan Jerad was seen today for fall and headache.  Diagnoses and all orders for this visit:  Rib pain on right side Fall in home, initial encounter Discussed that there is no fracture Advised biofreeze -     DG Ribs Unilateral W/Chest Right; Future  On continuous oral anticoagulation  Discussed fall risks Family plans to come up with other ways for the dog to be walked   [place]

## 2017-08-31 NOTE — Patient Instructions (Addendum)
IMPRESSION: 1. No evidence of a right rib fracture. Should the patient's symptoms persist or worsen, repeat radiographs of the ribs in 10 - 14 days maybe of use to detect subtle nondisplaced rib fractures (which are commonly occult on initial imaging). 2. No acute cardiopulmonary disease. 3. Stable chronic nonspecific reticulation at the lung bases.   Electronically Signed   By: Ilona Sorrel M.D.   On: 08/31/2017 15:13    IF you received an x-ray today, you will receive an invoice from [facility]. Please contact [facility] at [phone] with questions or concerns regarding your invoice.   IF you received labwork today, you will receive an invoice from [facility]. Please contact [facility] at [phone] with questions or concerns regarding your invoice.   Our billing staff will not be able to assist you with questions regarding bills from these companies.  You will be contacted with the lab results as soon as they are available. The fastest way to get your results is to activate your My Chart account. Instructions are located on the last page of this paperwork. If you have not heard from [place] regarding the results in 2 weeks, please contact this office.     Musculoskeletal Pain Musculoskeletal pain is muscle and bone aches and pains. This pain can occur in any part of the body. Follow these instructions at home:  Only take medicines for pain, discomfort, or fever as told by your health care provider.  You may continue all activities unless the activities cause more pain. When the pain lessens, slowly resume normal activities. Gradually increase the intensity and duration of the activities or exercise.  During periods of severe pain, bed rest may be helpful. Lie or sit in any position that is comfortable, but get out of bed and walk around at least every several hours.  If directed, put ice on the injured area. ? Put ice in a plastic bag. ? Place a towel between  your skin and the bag. ? Leave the ice on for 20 minutes, 2-3 times a day. Contact a health care provider if:  Your pain is getting worse.  Your pain is not relieved with medicines.  You lose function in the area of the pain if the pain is in your arms, legs, or neck. This information is not intended to replace advice given to you by your health care provider. Make sure you discuss any questions you have with your health care provider. Document Released: 08/20/2005 Document Revised: 01/31/2016 Document Reviewed: 04/24/2013 Elsevier Interactive Patient Education  2017 [employer].

## 2017-09-23 DIAGNOSIS — N183 Chronic kidney disease, stage 3 (moderate): Secondary | ICD-10-CM | POA: Diagnosis not present

## 2017-10-03 DIAGNOSIS — N2581 Secondary hyperparathyroidism of renal origin: Secondary | ICD-10-CM | POA: Diagnosis not present

## 2017-10-03 DIAGNOSIS — N183 Chronic kidney disease, stage 3 (moderate): Secondary | ICD-10-CM | POA: Diagnosis not present

## 2017-10-03 DIAGNOSIS — Z789 Other specified health status: Secondary | ICD-10-CM | POA: Diagnosis not present

## 2017-10-03 DIAGNOSIS — I129 Hypertensive chronic kidney disease with stage 1 through stage 4 chronic kidney disease, or unspecified chronic kidney disease: Secondary | ICD-10-CM | POA: Diagnosis not present

## 2017-10-03 DIAGNOSIS — G629 Polyneuropathy, unspecified: Secondary | ICD-10-CM | POA: Diagnosis not present

## 2017-10-03 DIAGNOSIS — D631 Anemia in chronic kidney disease: Secondary | ICD-10-CM | POA: Diagnosis not present

## 2017-10-13 ENCOUNTER — Other Ambulatory Visit: Payer: Self-pay | Admitting: Physician Assistant

## 2017-10-13 DIAGNOSIS — G609 Hereditary and idiopathic neuropathy, unspecified: Secondary | ICD-10-CM

## 2017-10-15 NOTE — Telephone Encounter (Signed)
Neyrontin refill Last OV: 08/31/17 Last Refill:08/22/17 Pharmacy:[facility]

## 2017-10-21 ENCOUNTER — Telehealth: Payer: Self-pay | Admitting: Physician Assistant

## 2017-10-21 NOTE — Telephone Encounter (Signed)
Gabapentin, Omeprazole refill Last OV: 08/31/17 Last Refill:Gabapentin 08/21/17 540 tab/0 refill; Omeprazole 09/08/16 180 tab/3 reifll by another provider [place]

## 2017-10-21 NOTE — Telephone Encounter (Signed)
Copied from [place]. Topic: Quick Communication - See Telephone Encounter >> Oct 21, 2017 12:18 PM Vernona Rieger wrote: CRM for notification. See Telephone encounter for:   10/21/17.  Pt's Guardian said she [facility] for his gabapentin 300mg  & omeprazole (PRILOSEC) 20 MG capsule   [facility] - [place], [place]

## 2017-10-23 NOTE — Telephone Encounter (Signed)
Call to pt, spoke with daughter.  Security code on release of information confirmed.  Advised that pt will need an office visit to obtain add'l refill.  (Instructions were noted on previous refill but daughter states they didn't receive that message.)  Daughter states pt may prefer not to take medication if he has to have office visit for refills.  Apologized.  Am unable to go outside of practice protocol for medication. Pt scheduled.

## 2017-10-29 ENCOUNTER — Ambulatory Visit (INDEPENDENT_AMBULATORY_CARE_PROVIDER_SITE_OTHER): Payer: PPO | Admitting: Physician Assistant

## 2017-10-29 ENCOUNTER — Encounter: Payer: Self-pay | Admitting: Physician Assistant

## 2017-10-29 ENCOUNTER — Ambulatory Visit: Payer: PPO | Admitting: Physician Assistant

## 2017-10-29 VITALS — BP 125/75 | HR 58 | Temp 97.4°F | Resp 17 | Ht 60.0 in | Wt 160.0 lb

## 2017-10-29 DIAGNOSIS — K219 Gastro-esophageal reflux disease without esophagitis: Secondary | ICD-10-CM | POA: Diagnosis not present

## 2017-10-29 DIAGNOSIS — E039 Hypothyroidism, unspecified: Secondary | ICD-10-CM

## 2017-10-29 DIAGNOSIS — Z760 Encounter for issue of repeat prescription: Secondary | ICD-10-CM | POA: Diagnosis not present

## 2017-10-29 MED ORDER — LEVOTHYROXINE SODIUM 25 MCG PO CAPS: 25.0000 ug | ORAL_CAPSULE | Freq: Every day | ORAL | 3 refills | Status: DC

## 2017-10-29 MED ORDER — OMEPRAZOLE 20 MG PO CPDR: 20.0000 mg | DELAYED_RELEASE_CAPSULE | Freq: Two times a day (BID) | ORAL | 3 refills | Status: DC

## 2017-10-29 NOTE — Progress Notes (Signed)
[name] Elliott  MRN: [id number] DOB: [birth year]  PCP: Dorise Hiss, PA-C  Subjective:  Pt is an 84 year old male PMH HTN, CAD, CKD stage 3, heart failure, TIA, DVT, DM, who presents to clinic for medication refills for Prilosec and levothyroxine.   Hypothyroidism - levothyroxine 28mcg qd. He is asymptomatic today.   Dr. Posey Pronto nephrologist - last OV was a month ago. Next OV in 6 months. Referral made to neurology for work-up of LES weakness.  Cardiology appt in 2 months.    Review of Systems  Constitutional: Negative for chills and diaphoresis.  Respiratory: Negative for cough, chest tightness, shortness of breath and wheezing.   Cardiovascular: Negative for chest pain, palpitations and leg swelling.  Gastrointestinal: Negative for diarrhea, nausea and vomiting.  Musculoskeletal: Negative for neck pain.  Neurological: Negative for dizziness, syncope, light-headedness and headaches.  Psychiatric/Behavioral: Negative for sleep disturbance. The patient is not nervous/anxious.     Patient Active Problem List   Diagnosis Date Noted  . PAF (paroxysmal atrial fibrillation) ([place]) 04/30/2017  . Shortness of breath 06/04/2016  . Chronic systolic CHF (congestive heart failure) ([place]) 06/04/2016  . Bilateral leg edema 12/12/2015  . Dizziness 09/27/2015  . Primary localized osteoarthritis of left knee 07/12/2015  . Weakness 02/23/2015  . DVT (deep venous thrombosis) ([place]) 12/14/2014  . Combined congestive systolic and diastolic heart failure ([place]) 12/14/2014  . Cerebral infarction due to unspecified mechanism   . TIA (transient ischemic attack) 08/01/2014  . Varicose veins of both legs with edema 05/07/2014  . Bilateral leg pain 04/16/2014  . Chronic kidney disease, stage III (moderate) ([employer]) 12/21/2013  . Vascular dementia without behavioral disturbance   . Type 2 diabetes mellitus, controlled, with renal complications ([place]) 93/23/5573  . Diastolic dysfunction 22/10/5425    . Knee pain, bilateral 03/01/2013  . Hereditary and idiopathic peripheral neuropathy 11/26/2012  . Osteoporosis, unspecified 11/26/2012  . GI bleed due to NSAIDs, DDX=Isch colitis, infectious colitis 11/19/2012  . Cough 05/29/2012  . 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 CONSTIPATION, CHRONIC 08/02/2010  . INSOMNIA, CHRONIC 03/08/2010  . Unspecified hypothyroidism 01/04/2010  . VITAMIN B12 DEFICIENCY 01/04/2010  . HYPERCHOLESTEROLEMIA 08/12/2009  . Essential hypertension 08/12/2009  . Coronary artery disease involving native coronary artery of native heart without angina pectoris 08/12/2009  . GERD 08/12/2009    Current Outpatient Medications on File Prior to Visit  Medication Sig Dispense Refill  . aspirin EC 81 MG tablet Take 81 mg by mouth daily.     Marland Kitchen bismuth subsalicylate (PEPTO-BISMOL) 262 MG/15ML suspension Take 30 mLs by mouth every 6 (six) hours as needed. 360 mL 0  . gabapentin (NEURONTIN) 300 MG capsule Take 1 capsule (300 mg total) by mouth 3 (three) times daily. Needs office visit for additional refills 540 capsule 0  . lansoprazole (PREVACID) 30 MG capsule Take 2 capsules (60 mg total) by mouth daily at 12 noon. 2 capsule 0  . Levothyroxine Sodium 25 MCG CAPS Take 1 capsule (25 mcg total) by mouth daily before breakfast. 45 capsule 3  . loratadine (CLARITIN) 10 MG tablet Take 1 tablet (10 mg total) by mouth daily. 30 tablet 1  . magnesium hydroxide (MILK OF MAGNESIA) 400 MG/5ML suspension Take 15 mLs by mouth daily as needed for mild constipation or moderate constipation.    Marland Kitchen omeprazole (PRILOSEC) 20 MG capsule Take 1 capsule (20 mg total) by mouth 2 (two) times daily before a meal. 180 capsule 3  . potassium chloride  SA (K-DUR,KLOR-CON) 20 MEQ tablet Take 1 tablet (20 mEq total) by mouth every other day. 90 tablet 3  . XARELTO 15 MG TABS tablet Take 1 tablet by mouth once daily 90 tablet 1  . furosemide (LASIX) 40 MG tablet Take 1 tablet (40 mg total)  by mouth every other day. 90 tablet 3  . [DISCONTINUED] Hyoscyamine Sulfate (HYOMAX-DT) 0.375 MG TBCR Take 1 tab bid for 5 days, then prn abd pain 30 each 1   No current facility-administered medications on file prior to visit.     Allergies  Allergen Reactions  . Crestor [Rosuvastatin Calcium] Other (See Comments)    Muscle pain/aches     Objective:  BP 125/75   Pulse (!) 58   Temp (!) 97.4 F (36.3 C) (Oral)   Resp 17   Ht 5' (1.524 m)   Wt 160 lb (72.6 kg)   SpO2 98%   BMI 31.25 kg/m   Physical Exam  Constitutional: He is oriented to person, place, and time and well-developed, well-nourished, and in no distress. No distress.  Cardiovascular: Normal rate, regular rhythm and normal heart sounds.  Neurological: He is alert and oriented to person, place, and time. GCS score is 15.  Skin: Skin is warm and dry.  Psychiatric: Mood, memory, affect and judgment normal.  Vitals reviewed.  Lab Results  Component Value Date   TSH 5.290 (H) 03/16/2017    Assessment and Plan :  1. Gastroesophageal reflux disease without esophagitis - omeprazole (PRILOSEC) 20 MG capsule; Take 1 capsule (20 mg total) by mouth 2 (two) times daily before a meal.  Dispense: 180 capsule; Refill: 3 - pt doing well. Is following up with cardiology and nephrology regularly. He has appt with neurology in May for eval of LES weakness.  2. Hypothyroidism, unspecified type - Levothyroxine Sodium 25 MCG CAPS; Take 1 capsule (25 mcg total) by mouth daily before breakfast.  Dispense: 45 capsule; Refill: 3 - TSH - last TSH 7/14 and was 5.29. Plan to recheck TSH today, will contact with results and any medication changes if needed.  3. Encounter for medication refill   Mercer Pod, PA-C  [facility] 10/29/2017 11:14 AM

## 2017-10-29 NOTE — Patient Instructions (Signed)
  We will contact you with the results of your thyroid levels.  Continue taking medications as prescribed.  Come back and see me in 6 months.   Thank you for coming in today. I hope you feel we met your needs.  Feel free to call PCP if you have any questions or further requests.  Please consider signing up for MyChart if you do not already have it, as this is a great way to communicate with me.  Best,  [employer], PA-C

## 2017-10-30 LAB — TSH: TSH: 6.58 u[IU]/mL — ABNORMAL HIGH (ref 0.450–4.500)

## 2017-10-31 ENCOUNTER — Emergency Department (HOSPITAL_COMMUNITY)
Admission: EM | Admit: 2017-10-31 | Discharge: 2017-11-01 | Disposition: A | Discharge status: Home / Self Care | Payer: PPO | Attending: Emergency Medicine | Admitting: Emergency Medicine

## 2017-10-31 ENCOUNTER — Encounter (HOSPITAL_COMMUNITY): Payer: Self-pay | Admitting: Emergency Medicine

## 2017-10-31 DIAGNOSIS — I110 Hypertensive heart disease with heart failure: Secondary | ICD-10-CM | POA: Diagnosis not present

## 2017-10-31 DIAGNOSIS — Z96652 Presence of left artificial knee joint: Secondary | ICD-10-CM | POA: Diagnosis not present

## 2017-10-31 DIAGNOSIS — Z87891 Personal history of nicotine dependence: Secondary | ICD-10-CM | POA: Insufficient documentation

## 2017-10-31 DIAGNOSIS — R040 Epistaxis: Secondary | ICD-10-CM | POA: Diagnosis not present

## 2017-10-31 DIAGNOSIS — I5022 Chronic systolic (congestive) heart failure: Secondary | ICD-10-CM | POA: Insufficient documentation

## 2017-10-31 DIAGNOSIS — Z8673 Personal history of transient ischemic attack (TIA), and cerebral infarction without residual deficits: Secondary | ICD-10-CM | POA: Diagnosis not present

## 2017-10-31 DIAGNOSIS — I2510 Atherosclerotic heart disease of native coronary artery without angina pectoris: Secondary | ICD-10-CM | POA: Insufficient documentation

## 2017-10-31 DIAGNOSIS — F0390 Unspecified dementia without behavioral disturbance: Secondary | ICD-10-CM | POA: Insufficient documentation

## 2017-10-31 DIAGNOSIS — E119 Type 2 diabetes mellitus without complications: Secondary | ICD-10-CM | POA: Diagnosis not present

## 2017-10-31 DIAGNOSIS — E039 Hypothyroidism, unspecified: Secondary | ICD-10-CM | POA: Diagnosis not present

## 2017-10-31 DIAGNOSIS — Z79899 Other long term (current) drug therapy: Secondary | ICD-10-CM | POA: Insufficient documentation

## 2017-10-31 DIAGNOSIS — R58 Hemorrhage, not elsewhere classified: Secondary | ICD-10-CM | POA: Diagnosis not present

## 2017-10-31 DIAGNOSIS — Z7982 Long term (current) use of aspirin: Secondary | ICD-10-CM | POA: Insufficient documentation

## 2017-10-31 DIAGNOSIS — Z7901 Long term (current) use of anticoagulants: Secondary | ICD-10-CM | POA: Diagnosis not present

## 2017-10-31 DIAGNOSIS — J342 Deviated nasal septum: Secondary | ICD-10-CM | POA: Diagnosis not present

## 2017-10-31 LAB — BASIC METABOLIC PANEL
Anion gap: 13 (ref 5–15)
BUN: 28 mg/dL — AB (ref 6–20)
CHLORIDE: 99 mmol/L — AB (ref 101–111)
CO2: 25 mmol/L (ref 22–32)
Calcium: 9.3 mg/dL (ref 8.9–10.3)
Creatinine, Ser: 1.66 mg/dL — ABNORMAL HIGH (ref 0.61–1.24)
GFR calc Af Amer: 41 mL/min — ABNORMAL LOW (ref >60)
GFR calc non Af Amer: 35 mL/min — ABNORMAL LOW (ref >60)
Glucose, Bld: 118 mg/dL — ABNORMAL HIGH (ref 65–99)
POTASSIUM: 3.6 mmol/L (ref 3.5–5.1)
SODIUM: 137 mmol/L (ref 135–145)

## 2017-10-31 LAB — CBC WITH DIFFERENTIAL/PLATELET
Basophils Absolute: 0 10*3/uL (ref 0.0–0.1)
Basophils Relative: 0 %
EOS ABS: 0.2 10*3/uL (ref 0.0–0.7)
Eosinophils Relative: 3 %
HEMATOCRIT: 40.1 % (ref 39.0–52.0)
HEMOGLOBIN: 13.2 g/dL (ref 13.0–17.0)
LYMPHS ABS: 2.3 10*3/uL (ref 0.7–4.0)
LYMPHS PCT: 37 %
MCH: 30.7 pg (ref 26.0–34.0)
MCHC: 32.9 g/dL (ref 30.0–36.0)
MCV: 93.3 fL (ref 78.0–100.0)
MONOS PCT: 9 %
Monocytes Absolute: 0.6 10*3/uL (ref 0.1–1.0)
NEUTROS ABS: 3.1 10*3/uL (ref 1.7–7.7)
NEUTROS PCT: 51 %
Platelets: 167 10*3/uL (ref 150–400)
RBC: 4.3 MIL/uL (ref 4.22–5.81)
RDW: 14.5 % (ref 11.5–15.5)
WBC: 6.1 10*3/uL (ref 4.0–10.5)

## 2017-10-31 MED ORDER — OXYMETAZOLINE HCL 0.05 % NA SOLN
1.0000 | Freq: Once | NASAL | Status: AC
  Administered 2017-10-31: 1 via NASAL
  Filled 2017-10-31: qty 15

## 2017-10-31 NOTE — ED Triage Notes (Signed)
Per EMS, pt from home alone, began to have a nose bleed today around 6:30pm( this also happened last night.)  EMS reports they got the bleeding stopped once but started back up, is on blood thinners.

## 2017-10-31 NOTE — ED Provider Notes (Signed)
[facility] EMERGENCY DEPARTMENT Provider Note   CSN: [id number] Arrival date & time: 10/31/17  2217     History   Chief Complaint Chief Complaint  Patient presents with  . Epistaxis    HPI [name] Elliott is a 84 y.o. male.  The history is provided by the patient and medical records.  Epistaxis      84 y.o. M with hx of arthritis, AFIB on xarelto, CAD, blood dyscrasia, CHF, DM, hx DVT, hypothyroidism, chronic pain, prior stroke, presenting to the ED for nosebleed.  States he was using the bathroom when he started to feel something "trickle" down his face.  States he wiped it away with his hand and noticed it was blood.  This was around 6:30-7pm.  Bleeding has been slowly oozing since then.  He could not get it to stop so called EMS who was able to get it to stop for a minute but it started back again.  He takes xarelto and daily ASA.  Past Medical History:  Diagnosis Date  . Arthritis   . Arthropathy, unspecified, site unspecified   . Atrial fibrillation ([place])   . Blood dyscrasia    thromboctopenia pt family states he was never told of this  . CAD (coronary artery disease)    last cath in 2012. Managed medically-some blockages  . Chronic lower back pain   . Chronic systolic CHF (congestive heart failure) ([place]) 06/04/2016  . Constipation, chronic   . Diabetes mellitus without complication ([place])    pt states he doesn't have  . DVT (deep venous thrombosis) ([place])   . Dysrhythmia   . Esophageal reflux   . 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 MRI Nov '11 - tendonosis and partial tear. Left shoulder surgery Jan '12 for partial shoulder replacement. [name])   . Headache(784.0)   . History of stomach ulcers 11/2012  . Hypothyroidism    pt states he doeen't have   . Orthostasis   . Other B-complex deficiencies   . Other malaise and fatigue   . Pain in joint, shoulder region    Has chronic shoulder pain  . Pain in limb   .  Persistent disorder of initiating or maintaining sleep   . Pneumonia 1980's?; 2011  . Primary localized osteoarthritis of left knee 07/12/2015  . Pure hypercholesterolemia   . PVC's (premature ventricular contractions)   . S/P cardiac cath 08/08/11   mild to moderate CAD primarily in the LAD. None are obstructive and appear stable from prior cath in 2007; managed medically  . Sebaceous cyst   . Stroke ([place])   . Thrombocytopenia, unspecified ([place])   . Unspecified essential hypertension   . Vascular dementia without behavioral disturbance    with periods of amnesia    Patient Active Problem List   Diagnosis Date Noted  . PAF (paroxysmal atrial fibrillation) ([place]) 04/30/2017  . Shortness of breath 06/04/2016  . Chronic systolic CHF (congestive heart failure) ([place]) 06/04/2016  . Bilateral leg edema 12/12/2015  . Dizziness 09/27/2015  . Primary localized osteoarthritis of left knee 07/12/2015  . Weakness 02/23/2015  . DVT (deep venous thrombosis) ([place]) 12/14/2014  . Combined congestive systolic and diastolic heart failure ([place]) 12/14/2014  . Cerebral infarction due to unspecified mechanism   . TIA (transient ischemic attack) 08/01/2014  . Varicose veins of both legs with edema 05/07/2014  . Bilateral leg pain 04/16/2014  . Chronic kidney disease, stage III (moderate) ([place]) 12/21/2013  . Vascular dementia without  behavioral disturbance   . Type 2 diabetes mellitus, controlled, with renal complications ([place]) 24/40/1027  . Diastolic dysfunction 25/36/6440  . Knee pain, bilateral 03/01/2013  . Hereditary and idiopathic peripheral neuropathy 11/26/2012  . Osteoporosis, unspecified 11/26/2012  . GI bleed due to NSAIDs, DDX=Isch colitis, infectious colitis 11/19/2012  . Cough 05/29/2012  . 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 CONSTIPATION, CHRONIC 08/02/2010  . INSOMNIA, CHRONIC 03/08/2010  . Unspecified hypothyroidism 01/04/2010  . VITAMIN B12 DEFICIENCY 01/04/2010    . HYPERCHOLESTEROLEMIA 08/12/2009  . Essential hypertension 08/12/2009  . Coronary artery disease involving native coronary artery of native heart without angina pectoris 08/12/2009  . GERD 08/12/2009    Past Surgical History:  Procedure Laterality Date  . BACK SURGERY    . CARDIAC CATHETERIZATION  08/2011  . CATARACT EXTRACTION Left 2009  . COLONOSCOPY    . ESOPHAGOGASTRODUODENOSCOPY N/A 11/19/2012   Procedure: ESOPHAGOGASTRODUODENOSCOPY (EGD);  Surgeon: Jerene Bears, MD;  Location: [place];  Service: Gastroenterology;  Laterality: N/A;  . FINGER AMPUTATION Right    pinky finger  . HAND SURGERY Right    crush injury, right fifth digit contracture, limited  motion  . HARDWARE REMOVAL  02/05/2012   Procedure: HARDWARE REMOVAL;  Surgeon: Johnny Bridge, MD;  Location: [place];  Service: Orthopedics;  Laterality: Left;  . KNEE SURGERY Left 1991   "did it twice in 1 wk" (02/17/2013)  . LUMBAR SPINE SURGERY  2007   Dr Philip Aspen ([place])  . REVERSE SHOULDER ARTHROPLASTY  02/05/2012   Procedure: REVERSE SHOULDER ARTHROPLASTY;  Surgeon: Johnny Bridge, MD;  Location: [place];  Service: Orthopedics;  Laterality: Left;  . SHOULDER HEMI-ARTHROPLASTY    . SHOULDER OPEN ROTATOR CUFF REPAIR Left 8.30.2011  . TOTAL KNEE ARTHROPLASTY Left 07/12/2015   Procedure: TOTAL KNEE ARTHROPLASTY;  Surgeon: Marchia Bond, MD;  Location: [place];  Service: Orthopedics;  Laterality: Left;  . US ECHOCARDIOGRAPHY  02-28-2010   Est EF 50-55%       Home Medications    Prior to Admission medications   Medication Sig Start Date End Date Taking? Authorizing Provider  aspirin EC 81 MG tablet Take 81 mg by mouth daily.     [provider]  bismuth subsalicylate (PEPTO-BISMOL) 262 MG/15ML suspension Take 30 mLs by mouth every 6 (six) hours as needed. 03/19/17   McVey, Gelene Mink, PA-C  furosemide (LASIX) 40 MG tablet Take 1 tablet (40 mg total) by mouth every other day. 04/30/17 07/29/17  Nahser,  Wonda Cheng, MD  gabapentin (NEURONTIN) 300 MG capsule Take 1 capsule (300 mg total) by mouth 3 (three) times daily. Needs office visit for additional refills 08/22/17   McVey, Gelene Mink, PA-C  lansoprazole (PREVACID) 30 MG capsule Take 2 capsules (60 mg total) by mouth daily at 12 noon. 03/19/17   McVey, Gelene Mink, PA-C  Levothyroxine Sodium 25 MCG CAPS Take 1 capsule (25 mcg total) by mouth daily before breakfast. 10/29/17   McVey, Gelene Mink, PA-C  loratadine (CLARITIN) 10 MG tablet Take 1 tablet (10 mg total) by mouth daily. 08/22/16   McVey, Gelene Mink, PA-C  magnesium hydroxide (MILK OF MAGNESIA) 400 MG/5ML suspension Take 15 mLs by mouth daily as needed for mild constipation or moderate constipation.    [provider]  omeprazole (PRILOSEC) 20 MG capsule Take 1 capsule (20 mg total) by mouth 2 (two) times daily before a meal. 10/29/17   McVey, Gelene Mink, PA-C  potassium chloride SA (K-DUR,KLOR-CON) 20 MEQ  tablet Take 1 tablet (20 mEq total) by mouth every other day. 04/30/17   Nahser, Wonda Cheng, MD  XARELTO 15 MG TABS tablet Take 1 tablet by mouth once daily 07/23/17   Nahser, Wonda Cheng, MD  Hyoscyamine Sulfate (HYOMAX-DT) 0.375 MG TBCR Take 1 tab bid for 5 days, then prn abd pain 10/15/11 11/19/11  Inda Castle, MD    Family History Family History  Problem Relation Age of Onset  . Breast cancer Mother   . Cancer Mother   . Diabetes Mother   . Cancer Brother        throat  . Stroke Brother   . Kidney disease Father   . Diabetes Sister   . Diabetes Daughter   . Colon cancer Neg Hx   . Heart attack Neg Hx     Social History Social History   Tobacco Use  . Smoking status: Former Smoker    Types: Cigars    Last attempt to quit: 09/03/1978    Years since quitting: 39.1  . Smokeless tobacco: Current User    Types: Chew  . Tobacco comment: 02/17/2013 "I aien't smoked a cigar in 20-30 yr"  Substance Use Topics  . Alcohol use: No     Alcohol/week: 0.0 oz  . Drug use: No     Allergies   Crestor [rosuvastatin calcium]   Review of Systems Review of Systems  HENT: Positive for nosebleeds.   All other systems reviewed and are negative.    Physical Exam Updated Vital Signs BP 131/61   Pulse 67   SpO2 97%   Physical Exam  Constitutional: He is oriented to person, place, and time. He appears well-developed and well-nourished.  HENT:  Head: Normocephalic and atraumatic.  Mouth/Throat: Oropharynx is clear and moist.  Large clot noted in left nare, blood slowly trickling around; no signs of facial trauma or deformities  Eyes: Conjunctivae and EOM are normal. Pupils are equal, round, and reactive to light.  Neck: Normal range of motion.  Cardiovascular: Normal rate, regular rhythm and normal heart sounds.  Pulmonary/Chest: Effort normal and breath sounds normal. No stridor. No respiratory distress.  Abdominal: Soft. Bowel sounds are normal. There is no tenderness. There is no rebound.  Musculoskeletal: Normal range of motion.  Neurological: He is alert and oriented to person, place, and time.  Skin: Skin is warm and dry.  Psychiatric: He has a normal mood and affect.  Nursing note and vitals reviewed.    ED Treatments / Results  Labs (all labs ordered are listed, but only abnormal results are displayed) Labs Reviewed  BASIC METABOLIC PANEL - Abnormal; Notable for the following components:      Result Value   Chloride 99 (*)    Glucose, Bld 118 (*)    BUN 28 (*)    Creatinine, Ser 1.66 (*)    GFR calc non Af Amer 35 (*)    GFR calc Af Amer 41 (*)    All other components within normal limits  CBC WITH DIFFERENTIAL/PLATELET    EKG  EKG Interpretation None       Radiology No results found.  Procedures .Epistaxis Management Date/Time: 10/31/2017 11:32 PM Performed by: Larene Pickett, PA-C Authorized by: Larene Pickett, PA-C   Consent:    Consent obtained:  Verbal   Consent given by:   Patient   Risks discussed:  Bleeding, infection, nasal injury and pain   Alternatives discussed:  No treatment Procedure details:    Treatment site:  L anterior   Treatment method:  Anterior pack   Treatment complexity:  Limited   Treatment episode: recurring   Post-procedure details:    Assessment:  Bleeding stopped   Patient tolerance of procedure:  Tolerated well, no immediate complications   (including critical care time)  Medications Ordered in ED Medications  oxymetazoline (AFRIN) 0.05 % nasal spray 1 spray (1 spray Each Nare Given 10/31/17 2329)  tranexamic acid (CYKLOKAPRON) injection 500 mg (500 mg Topical Given 11/01/17 0208)  lidocaine (XYLOCAINE) 2 % viscous mouth solution 15 mL (15 mLs Mouth/Throat Given 11/01/17 0300)     Initial Impression / Assessment and Plan / ED Course  I have reviewed the triage vital signs and the nursing notes.  Pertinent labs & imaging results that were available during my care of the patient were reviewed by me and considered in my medical decision making (see chart for details).  84 year old male presenting to the ED with intermittent epistaxis from left nostril.  Began last night has been intermittent since then.  Current bleed began around 630 to 7 PM.  EMS worked with him on the scene for nearly an hour and only got it to stop very briefly.  On arrival here he continues to have slow ooze from the left nostril but does appear to have large clot in the nostril.  Will order Afrin.  Blood work ordered as patient is on xarelto and ASA.  He apparently has known thrombocytopenia as well.  11:01 PM Had patient blow his nose completely, blew out a very large clot from left nostril.  Afrin applied, holding pressure for now.  VSS.  Blood work pending.  11:32 PM Nose still oozing.  Nasal packing placed and appears to be holding now.  Patient remains stable.  12:51 AM Patient was up for discharge and started to have some bleeding around his nasal  balloon.  This was removed, patient blew his nose again with lots of large clots expelled once again.  Additional afrin spray applied and seems to have stopped bleeding at this time.  He does appear to have a left sided septal hematoma.  Will monitor here.  1:18 AM Recurrent bleeding.  Patient is having worsening edema in the nose, unable to pass nasal balloon at this time.  Will use txa as a neb given swelling in the nose.  3:08 AM Patient with recurrent bleeding after TXA neb.  Dr. Leonides Schanz has evaluated and managed to get double balloon posterior nasal balloon placed.  No bleeding right now.  Will monitor for next 30 mins.  If recurrent bleeding, will need ENT.  3:17 AM Patient with recurrent bleeding around nasal packing.  Will consult with ENT.  3:38 AM Spoke with ENT, Dr. Janace Hoard-- he will come evaluate patient. Requested ENT cart which I have rolled to bedside.  5:48 AM Patient was to be discharged again after ENT eval, now with recurrent bleeding.  ENT re-paged.  6:21 AM Dr. Janace Hoard has re-packed patient's nose.  Patient seemed to be gurgling when he was placing packing.  Recommended CXR to assess for aspiration given his prolonged bleeding.  This was ordered.  He will need to be on keflex or similar abx with office FU in 5 days.  6:33 AM Patient has gotten himself dressed, is now standing in the hallway yelling and screaming that he wants to go home.  He refuses CXR.  Patient's daughter was updated on recommendations, she is comfortable with him leaving.  She understands symptoms to  monitor for in regards to aspiration pneumonia.  He will be started on keflex.  ENT follow-up next week.  Return precautions given for any new/worsening symptoms.  Final Clinical Impressions(s) / ED Diagnoses   Final diagnoses:  Epistaxis    ED Discharge Orders        Ordered    cephALEXin (KEFLEX) 500 MG capsule  2 times daily     11/01/17 0634       Larene Pickett, PA-C 11/01/17 4035      Isla Pence, MD 11/01/17 [phone]

## 2017-11-01 DIAGNOSIS — R040 Epistaxis: Secondary | ICD-10-CM | POA: Diagnosis not present

## 2017-11-01 DIAGNOSIS — Z87891 Personal history of nicotine dependence: Secondary | ICD-10-CM | POA: Diagnosis not present

## 2017-11-01 DIAGNOSIS — J342 Deviated nasal septum: Secondary | ICD-10-CM | POA: Diagnosis not present

## 2017-11-01 DIAGNOSIS — Z7901 Long term (current) use of anticoagulants: Secondary | ICD-10-CM | POA: Diagnosis not present

## 2017-11-01 MED ORDER — CEPHALEXIN 500 MG PO CAPS: 500.0000 mg | ORAL_CAPSULE | Freq: Two times a day (BID) | ORAL | 0 refills | Status: DC

## 2017-11-01 MED ORDER — TRANEXAMIC ACID 1000 MG/10ML IV SOLN
500.0000 mg | Freq: Once | INTRAVENOUS | Status: AC
  Administered 2017-11-01: 500 mg via TOPICAL
  Filled 2017-11-01: qty 10

## 2017-11-01 MED ORDER — LIDOCAINE VISCOUS 2 % MT SOLN
15.0000 mL | Freq: Once | OROMUCOSAL | Status: AC
  Administered 2017-11-01: 15 mL via OROMUCOSAL
  Filled 2017-11-01: qty 15

## 2017-11-01 NOTE — ED Notes (Signed)
Informed provider nose continues to bleed.

## 2017-11-01 NOTE — ED Notes (Signed)
As pt was going to leave, he began to bleed again.  ENT bedside again.  RN changed pt status to in process again.  Daughter was notified as she was waiting to pick pt up in the car.

## 2017-11-01 NOTE — ED Notes (Signed)
Provider notified that the bleeding has begun again, she is bedside

## 2017-11-01 NOTE — ED Notes (Addendum)
Pt refused to stay for the X-ray.  RN explained the risks of asperation and the need for the X-ray to his daughter who understands and will ensure that the pt does follow up w/ a doctor.  She stated "Nobody is going to keep him here." RN also explained the antibiotics to the daughter.  Pt refused discharge vitals, yelling in hall "push me out of here or I am going to get out of this wheelchair and walk."  Pt left AMA

## 2017-11-01 NOTE — Progress Notes (Signed)
   Subjective/Chief Complaint: Patient started bleeding again after discharged. He was positioned back into bed by nursing.    Objective: Vital signs in last 24 hours: Pulse Rate:  [57-89] 64 (03/01 0515) Resp:  [18] 18 (03/01 0215) BP: (116-149)/(57-97) 121/67 (03/01 0515) SpO2:  [95 %-100 %] 98 % (03/01 0515)    Intake/Output from previous day: No intake/output data recorded. Intake/Output this shift: No intake/output data recorded.  see below  Lab Results:  Recent Labs    10/31/17 2309  WBC 6.1  HGB 13.2  HCT 40.1  PLT 167   BMET Recent Labs    10/31/17 2309  NA 137  K 3.6  CL 99*  CO2 25  GLUCOSE 118*  BUN 28*  CREATININE 1.66*  CALCIUM 9.3   PT/INR No results for input(s): LABPROT, INR in the last 72 hours. ABG No results for input(s): PHART, HCO3 in the last 72 hours.  Invalid input(s): PCO2, PO2  Studies/Results: No results found.  Anti-infectives: Anti-infectives (From admission, onward)   Start     Dose/Rate Route Frequency Ordered Stop   11/01/17 0000  cephALEXin (KEFLEX) 500 MG capsule     500 mg Oral 2 times daily 11/01/17 9532        Assessment/Plan: s/p * No surgery found * patient was awake and alert no breathing issues.  patient was bleeding again from the left nose. The patient was positioned again and the bleeding was coming from the superior nose. Pledgets were placed. The blood was was in the infundibulum and surgicel was placed into that area. There continued to bleeding from the superior area and more surgicel was placed into the superior nose. A mericel pack was placed soaked in bacritracin. The strings taped to the nose. The bleeding stopped. He needs to be on abx and discussed with PA. He needs to follow up in 5 days. He will come back to ER or call EMS if further bleeding.   LOS: 0 days    Melissa Montane 11/01/2017

## 2017-11-01 NOTE — ED Notes (Signed)
ENT provider @ bedside

## 2017-11-01 NOTE — Discharge Instructions (Addendum)
Follow-up with ENT in 5 days to have packing out. Take keflex as prescribed. Return to the ED for new or worsening symptoms.

## 2017-11-01 NOTE — Consult Note (Signed)
Reason for Consult:epistaxis Referring Physician:er  [name] Elliott is an 84 y.o. male.  HPI: hx of epistaxis for weeks. He bleeds from both sides. Usually it stops. He has bleed that contiinues today. ER has tried multiple packs without stopping. He is on blood thinner for clots. He cannot come off of it.    Past Medical History:  Diagnosis Date  . Arthritis   . Arthropathy, unspecified, site unspecified   . Atrial fibrillation ([place])   . Blood dyscrasia    thromboctopenia pt family states he was never told of this  . CAD (coronary artery disease)    last cath in 2012. Managed medically-some blockages  . Chronic lower back pain   . Chronic systolic CHF (congestive heart failure) ([place]) 06/04/2016  . Constipation, chronic   . Diabetes mellitus without complication ([place])    pt states he doesn't have  . DVT (deep venous thrombosis) ([place])   . Dysrhythmia   . Esophageal reflux   . 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 MRI Nov '11 - tendonosis and partial tear. Left shoulder surgery Jan '12 for partial shoulder replacement. [name])   . Headache(784.0)   . History of stomach ulcers 11/2012  . Hypothyroidism    pt states he doeen't have   . Orthostasis   . Other B-complex deficiencies   . Other malaise and fatigue   . Pain in joint, shoulder region    Has chronic shoulder pain  . Pain in limb   . Persistent disorder of initiating or maintaining sleep   . Pneumonia 1980's?; 2011  . Primary localized osteoarthritis of left knee 07/12/2015  . Pure hypercholesterolemia   . PVC's (premature ventricular contractions)   . S/P cardiac cath 08/08/11   mild to moderate CAD primarily in the LAD. None are obstructive and appear stable from prior cath in 2007; managed medically  . Sebaceous cyst   . Stroke ([place])   . Thrombocytopenia, unspecified ([place])   . Unspecified essential hypertension   . Vascular dementia without behavioral disturbance    with periods of  amnesia    Past Surgical History:  Procedure Laterality Date  . BACK SURGERY    . CARDIAC CATHETERIZATION  08/2011  . CATARACT EXTRACTION Left 2009  . COLONOSCOPY    . ESOPHAGOGASTRODUODENOSCOPY N/A 11/19/2012   Procedure: ESOPHAGOGASTRODUODENOSCOPY (EGD);  Surgeon: Jerene Bears, MD;  Location: [facility];  Service: Gastroenterology;  Laterality: N/A;  . FINGER AMPUTATION Right    pinky finger  . HAND SURGERY Right    crush injury, right fifth digit contracture, limited  motion  . HARDWARE REMOVAL  02/05/2012   Procedure: HARDWARE REMOVAL;  Surgeon: Johnny Bridge, MD;  Location: [facility];  Service: Orthopedics;  Laterality: Left;  . KNEE SURGERY Left 1991   "did it twice in 1 wk" (02/17/2013)  . LUMBAR SPINE SURGERY  2007   Dr Philip Aspen ([place])  . REVERSE SHOULDER ARTHROPLASTY  02/05/2012   Procedure: REVERSE SHOULDER ARTHROPLASTY;  Surgeon: Johnny Bridge, MD;  Location: [facility];  Service: Orthopedics;  Laterality: Left;  . SHOULDER HEMI-ARTHROPLASTY    . SHOULDER OPEN ROTATOR CUFF REPAIR Left 8.30.2011  . TOTAL KNEE ARTHROPLASTY Left 07/12/2015   Procedure: TOTAL KNEE ARTHROPLASTY;  Surgeon: Marchia Bond, MD;  Location: [facility];  Service: Orthopedics;  Laterality: Left;  . US ECHOCARDIOGRAPHY  02-28-2010   Est EF 50-55%    Family History  Problem Relation Age of Onset  . Breast cancer Mother   .  Cancer Mother   . Diabetes Mother   . Cancer Brother        throat  . Stroke Brother   . Kidney disease Father   . Diabetes Sister   . Diabetes Daughter   . Colon cancer Neg Hx   . Heart attack Neg Hx     Social History:  reports that he quit smoking about 39 years ago. His smoking use included cigars. His smokeless tobacco use includes chew. He reports that he does not drink alcohol or use drugs.  Allergies:  Allergies  Allergen Reactions  . Crestor [Rosuvastatin Calcium] Other (See Comments)    Muscle pain/aches    Medications: I have reviewed the patient's current  medications.  Results for orders placed or performed during the hospital encounter of 10/31/17 (from the past 48 hour(s))  CBC with Differential     Status: None   Collection Time: 10/31/17 11:09 PM  Result Value Ref Range   WBC 6.1 4.0 - 10.5 K/uL   RBC 4.30 4.22 - 5.81 MIL/uL   Hemoglobin 13.2 13.0 - 17.0 g/dL   HCT 40.1 39.0 - 52.0 %   MCV 93.3 78.0 - 100.0 fL   MCH 30.7 26.0 - 34.0 pg   MCHC 32.9 30.0 - 36.0 g/dL   RDW 14.5 11.5 - 15.5 %   Platelets 167 150 - 400 K/uL   Neutrophils Relative % 51 %   Neutro Abs 3.1 1.7 - 7.7 K/uL   Lymphocytes Relative 37 %   Lymphs Abs 2.3 0.7 - 4.0 K/uL   Monocytes Relative 9 %   Monocytes Absolute 0.6 0.1 - 1.0 K/uL   Eosinophils Relative 3 %   Eosinophils Absolute 0.2 0.0 - 0.7 K/uL   Basophils Relative 0 %   Basophils Absolute 0.0 0.0 - 0.1 K/uL    Comment: Performed at [facility], [address], [place], [place] [address]  Basic metabolic panel     Status: Abnormal   Collection Time: 10/31/17 11:09 PM  Result Value Ref Range   Sodium 137 135 - 145 mmol/L   Potassium 3.6 3.5 - 5.1 mmol/L   Chloride 99 (L) 101 - 111 mmol/L   CO2 25 22 - 32 mmol/L   Glucose, Bld 118 (H) 65 - 99 mg/dL   BUN 28 (H) 6 - 20 mg/dL   Creatinine, Ser 1.66 (H) 0.61 - 1.24 mg/dL   Calcium 9.3 8.9 - 10.3 mg/dL   GFR calc non Af Amer 35 (L) >60 mL/min   GFR calc Af Amer 41 (L) >60 mL/min    Comment: (NOTE) The eGFR has been calculated using the CKD EPI equation. This calculation has not been validated in all clinical situations. eGFR's persistently <60 mL/min signify possible Chronic Kidney Disease.    Anion gap 13 5 - 15    Comment: Performed at [facility] [address] [place],  [address]    No results found.  Review of Systems  Constitutional: Negative.   HENT: Positive for nosebleeds.   Eyes: Negative.   Skin: Negative.    Blood pressure 125/62, pulse 64, resp. rate 18, SpO2 98 %. Physical Exam  Constitutional: He  appears well-developed and well-nourished.  HENT:  Head: Normocephalic and atraumatic.  Mouth/Throat: Oropharynx is clear and moist.  He had ballon pack in  The left side. It was removed and clots suctioned out. He has a large spur off left septum. Pledgets were placed with afrin and lidocaine. They  were removed and there was some clot at middle meatus suctioned. There was no active bleeding. No blood per NP. No obvious bleeding sites.   Eyes: Conjunctivae are normal. Pupils are equal, round, and reactive to light.  Neck: Normal range of motion. Neck supple.    Assessment/Plan: Epistaxis- he has stopped after careful examination and pledgets. No blood for 40 minutes. He elected to go home. He should use saline and humidifier at home. He will follow up if needed  Melissa Montane 11/01/2017, 5:10 AM

## 2017-11-01 NOTE — ED Notes (Signed)
ENT back at bedside.

## 2017-11-06 DIAGNOSIS — R040 Epistaxis: Secondary | ICD-10-CM | POA: Diagnosis not present

## 2017-11-07 ENCOUNTER — Telehealth: Payer: Self-pay

## 2017-11-07 NOTE — Telephone Encounter (Signed)
Incoming call from [name] Elliott. She is calling regarding levothyroxine. Prescription for levothyroxine is written for capsules. Only formulation in capsule is brand Tirosint. Generic comes in tablet only. If provider would like capsules, need to dispense brand. Does provider want to dispense tablets instead? Copay for tablets is free.   Additionally, [name] Elliott is requesting 90 day supply, prescription was sent over for 45 tabs. Would like medication to have three refills.   Provider, please advise.

## 2017-11-07 NOTE — Telephone Encounter (Signed)
Copied from [place] [phone]. Topic: General - Other >> Nov 06, 2017  4:01 PM Yvette Rack wrote: Reason for CRM: Arbie Cookey from [facility] [phone] calling stating that the Rx Levothyroxine Sodium 25 MCG CAPS cost more if is the Brand Tirosint  Caps but if the provider change the Medicine to Generic Levothyroxine Tablets its cheaper for the pt you can call Arbie Cookey back on her personal phone at [phone] and leave a complete name of the person that's calling provider nurse assistant etc.. And give directions on which medicine pt will be put on

## 2017-11-07 NOTE — Telephone Encounter (Signed)
Phone call to Arbie Cookey at [employer]. Left detailed message for Arbie Cookey medication is not written to be dispense as written only, generic should be fine to dispense to patient. Please call back if any questions.

## 2017-11-08 NOTE — Telephone Encounter (Signed)
Not sure why this was sent to me -- will send to provider

## 2017-11-15 ENCOUNTER — Other Ambulatory Visit: Payer: Self-pay | Admitting: Physician Assistant

## 2017-11-15 DIAGNOSIS — E079 Disorder of thyroid, unspecified: Secondary | ICD-10-CM

## 2017-11-15 MED ORDER — LEVOTHYROXINE SODIUM 50 MCG PO TABS
50.0000 ug | ORAL_TABLET | Freq: Every day | ORAL | 3 refills | Status: DC
Start: 2017-11-15 — End: 2018-02-07

## 2017-12-05 ENCOUNTER — Encounter: Payer: Self-pay | Admitting: Neurology

## 2017-12-05 ENCOUNTER — Ambulatory Visit: Payer: PPO | Admitting: Neurology

## 2017-12-05 VITALS — BP 116/70 | HR 76 | Ht 60.0 in | Wt 158.5 lb

## 2017-12-05 DIAGNOSIS — R413 Other amnesia: Secondary | ICD-10-CM

## 2017-12-05 DIAGNOSIS — G609 Hereditary and idiopathic neuropathy, unspecified: Secondary | ICD-10-CM

## 2017-12-05 DIAGNOSIS — R42 Dizziness and giddiness: Secondary | ICD-10-CM

## 2017-12-05 MED ORDER — PYRIDOSTIGMINE BROMIDE 60 MG PO TABS: 60.0000 mg | ORAL_TABLET | Freq: Three times a day (TID) | ORAL | 11 refills | Status: DC

## 2017-12-05 NOTE — Progress Notes (Signed)
PATIENT: [name] Elliott DOB: 1928-07-23  Chief Complaint  Patient presents with  . Peripheral Neuropathy    He is here with his daughter, [name] Elliott.  He is here for further evaluation of pain, numbness and swelling his bilateral lower extremities.    . Nephrology    [name] Shiley, MD - referring provider  . PCP    McVey, Gelene Mink, PA-C     HISTORICAL  [name] Elliott is a 84 year old male, seen in refer by his nephrologist Dr. Elmarie Elliott, for evaluation of peripheral neuropathy, initial evaluation was on 12/05/2017. He is with his daughter at visit.  I reviewed and summarized the referring note, he has past medical history of congestive heart failure, DVT, hyperlipidemia, hypothyroidism, coronary artery disease, dementia, peripheral vascular disease, history of stroke, obesity, hypertension, anemia, chronic kidney disease,  He complains of intermittent dizziness since 2014, especially when he get up from sitting position, he had transient unsteadiness, sometimes his legs give out underneath him, to the point of fall, he also complains of numbness tingling moving up to his ankle,  starting at the bottom of the feet, intermittent finger paresthesia,  His dizziness gradually getting worse, oftentimes he has to sit back down to relieve the symptoms, sometimes feeling sick all over.  He lives alone, still drive short distance, has mild memory loss, he only had 3 years of medication, Mini-Mental Status Examination is 21 out of 23, he could not read or write  REVIEW OF SYSTEMS: Full 14 system review of systems performed and notable only for fatigue, chest pain, hearing loss, spinning sensation, easy bleeding, joint pain, swelling, cramps, achy muscles, headaches, weakness, dizziness  ALLERGIES: Allergies  Allergen Reactions  . Crestor [Rosuvastatin Calcium] Other (See Comments)    Muscle pain/aches    HOME MEDICATIONS: Current Outpatient Medications  Medication Sig Dispense Refill    . aspirin EC 81 MG tablet Take 81 mg by mouth daily.     Marland Kitchen bismuth subsalicylate (PEPTO-BISMOL) 262 MG/15ML suspension Take 30 mLs by mouth every 6 (six) hours as needed. (Patient taking differently: Take 30 mLs by mouth every 6 (six) hours as needed for indigestion or diarrhea or loose stools. ) 360 mL 0  . cephALEXin (KEFLEX) 500 MG capsule Take 1 capsule (500 mg total) by mouth 2 (two) times daily. 30 capsule 0  . gabapentin (NEURONTIN) 300 MG capsule Take 1 capsule (300 mg total) by mouth 3 (three) times daily. Needs office visit for additional refills 540 capsule 0  . lansoprazole (PREVACID) 30 MG capsule Take 2 capsules (60 mg total) by mouth daily at 12 noon. 2 capsule 0  . levothyroxine (SYNTHROID, LEVOTHROID) 50 MCG tablet Take 1 tablet (50 mcg total) by mouth daily. 90 tablet 3  . loratadine (CLARITIN) 10 MG tablet Take 1 tablet (10 mg total) by mouth daily. (Patient taking differently: Take 10 mg by mouth daily as needed for allergies. ) 30 tablet 1  . magnesium hydroxide (MILK OF MAGNESIA) 400 MG/5ML suspension Take 15 mLs by mouth daily as needed for mild constipation or moderate constipation.    Marland Kitchen omeprazole (PRILOSEC) 20 MG capsule Take 1 capsule (20 mg total) by mouth 2 (two) times daily before a meal. 180 capsule 3  . potassium chloride SA (K-DUR,KLOR-CON) 20 MEQ tablet Take 1 tablet (20 mEq total) by mouth every other day. (Patient taking differently: Take 20 mEq by mouth 2 (two) times daily. ) 90 tablet 3  . XARELTO 15 MG TABS tablet Take 1 tablet  by mouth once daily 90 tablet 1  . furosemide (LASIX) 40 MG tablet Take 1 tablet (40 mg total) by mouth every other day. (Patient taking differently: Take 80 mg by mouth daily. ) 90 tablet 3   No current facility-administered medications for this visit.     PAST MEDICAL HISTORY: Past Medical History:  Diagnosis Date  . Arthritis   . Arthropathy, unspecified, site unspecified   . Atrial fibrillation ([place])   . Blood dyscrasia     thromboctopenia pt family states he was never told of this  . CAD (coronary artery disease)    last cath in 2012. Managed medically-some blockages  . Chronic lower back pain   . Chronic systolic CHF (congestive heart failure) ([place]) 06/04/2016  . Constipation, chronic   . Diabetes mellitus without complication ([place])    pt states he doesn't have  . DVT (deep venous thrombosis) ([place])   . Dysrhythmia   . Esophageal reflux   . 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 MRI Nov '11 - tendonosis and partial tear. Left shoulder surgery Jan '12 for partial shoulder replacement. [name])   . Headache(784.0)   . History of stomach ulcers 11/2012  . Hypothyroidism    pt states he doeen't have   . Orthostasis   . Other B-complex deficiencies   . Other malaise and fatigue   . Pain in joint, shoulder region    Has chronic shoulder pain  . Pain in limb   . Persistent disorder of initiating or maintaining sleep   . Pneumonia 1980's?; 2011  . Primary localized osteoarthritis of left knee 07/12/2015  . Pure hypercholesterolemia   . PVC's (premature ventricular contractions)   . S/P cardiac cath 08/08/11   mild to moderate CAD primarily in the LAD. None are obstructive and appear stable from prior cath in 2007; managed medically  . Sebaceous cyst   . Stroke ([place])   . Thrombocytopenia, unspecified ([place])   . Unspecified essential hypertension   . Vascular dementia without behavioral disturbance    with periods of amnesia    PAST SURGICAL HISTORY: Past Surgical History:  Procedure Laterality Date  . BACK SURGERY    . CARDIAC CATHETERIZATION  08/2011  . CATARACT EXTRACTION Left 2009  . COLONOSCOPY    . ESOPHAGOGASTRODUODENOSCOPY N/A 11/19/2012   Procedure: ESOPHAGOGASTRODUODENOSCOPY (EGD);  Surgeon: Jerene Bears, MD;  Location: [place];  Service: Gastroenterology;  Laterality: N/A;  . FINGER AMPUTATION Right    pinky finger  . HAND SURGERY Right    crush injury,  right fifth digit contracture, limited  motion  . HARDWARE REMOVAL  02/05/2012   Procedure: HARDWARE REMOVAL;  Surgeon: Johnny Bridge, MD;  Location: [place];  Service: Orthopedics;  Laterality: Left;  . KNEE SURGERY Left 1991   "did it twice in 1 wk" (02/17/2013)  . LUMBAR SPINE SURGERY  2007   Dr Philip Aspen ([place])  . REVERSE SHOULDER ARTHROPLASTY  02/05/2012   Procedure: REVERSE SHOULDER ARTHROPLASTY;  Surgeon: Johnny Bridge, MD;  Location: [place];  Service: Orthopedics;  Laterality: Left;  . SHOULDER HEMI-ARTHROPLASTY    . SHOULDER OPEN ROTATOR CUFF REPAIR Left 8.30.2011  . TOTAL KNEE ARTHROPLASTY Left 07/12/2015   Procedure: TOTAL KNEE ARTHROPLASTY;  Surgeon: Marchia Bond, MD;  Location: [place];  Service: Orthopedics;  Laterality: Left;  . US ECHOCARDIOGRAPHY  02-28-2010   Est EF 50-55%    FAMILY HISTORY: Family History  Problem Relation Age of Onset  . Breast cancer  Mother   . Cancer Mother   . Diabetes Mother   . Cancer Brother        throat  . Stroke Brother   . Kidney disease Father   . Diabetes Sister   . Diabetes Daughter   . Colon cancer Neg Hx   . Heart attack Neg Hx     SOCIAL HISTORY:  Social History   Socioeconomic History  . Marital status: Widowed    Spouse name: Not on file  . Number of children: 2  . Years of education: 3rd  . Highest education level: Not on file  Occupational History  . Occupation: retired    Fish farm manager: RETIRED  Social Needs  . Financial resource strain: Not on file  . Food insecurity:    Worry: Not on file    Inability: Not on file  . Transportation needs:    Medical: Not on file    Non-medical: Not on file  Tobacco Use  . Smoking status: Former Smoker    Types: Cigars    Last attempt to quit: 09/03/1978    Years since quitting: 39.2  . Smokeless tobacco: Current User    Types: Chew  . Tobacco comment: 02/17/2013 "I aien't smoked a cigar in 20-30 yr"  Substance and Sexual Activity  . Alcohol use: No    Alcohol/week: 0.0  oz  . Drug use: No  . Sexual activity: Never  Lifestyle  . Physical activity:    Days per week: Not on file    Minutes per session: Not on file  . Stress: Not on file  Relationships  . Social connections:    Talks on phone: Not on file    Gets together: Not on file    Attends religious service: Not on file    Active member of club or organization: Not on file    Attends meetings of clubs or organizations: Not on file    Relationship status: Not on file  . Intimate partner violence:    Fear of current or ex partner: Not on file    Emotionally abused: Not on file    Physically abused: Not on file    Forced sexual activity: Not on file  Other Topics Concern  . Not on file  Social History Narrative   3rd grade education. Married '58, '95. 2 dtr '59, '64, youngest daughter died septic kidney.    Lives alone, handles all ADLs   Right handed.   1 cup coffee per day, occasional soda or tea.      Has living will   Daughter [name] Elliott is health care POA   Would accept resuscitation attempts--- but no prolonged ventilation.   Not sure about tube feeds.              PHYSICAL EXAM   Vitals:   12/05/17 1356  BP: 116/70  Pulse: 76  Weight: 158 lb 8 oz (71.9 kg)  Height: 5' (1.524 m)    Not recorded      Body mass index is 30.95 kg/m.  Lying down blood pressure 143/69, heart rate of 67, standing up 113/70, heart rate of 88, standing up for 3 minutes, 130/72, heart rate of 82  PHYSICAL EXAMNIATION:  Gen: NAD, conversant, well nourised, obese, well groomed                     Cardiovascular: Regular rate rhythm, no peripheral edema, warm, nontender. Eyes: Conjunctivae clear without exudates or hemorrhage Neck: Supple, no  carotid bruits. Pulmonary: Clear to auscultation bilaterally   NEUROLOGICAL EXAM:  MENTAL STATUS: 21/23 Speech:    Speech is normal; tired looking  Cognition:     Orientation to time, but not oriented to season and the name of my clinic     Normal  recent and remote memory     Decreased attention span and concentration     Normal Language, naming, repeating,spontaneous speech     He cannot read or write    CRANIAL NERVES: CN II: Visual fields are full to confrontation. Fundoscopic exam is normal with sharp discs and no vascular changes. Pupils are round equal and briskly reactive to light. CN III, IV, VI: extraocular movement are normal. No ptosis. CN V: Facial sensation is intact to pinprick in all 3 divisions bilaterally. Corneal responses are intact.  CN VII: Face is symmetric with normal eye closure and smile. CN VIII: Hearing is normal to rubbing fingers CN IX, X: Palate elevates symmetrically. Phonation is normal. CN XI: Head turning and shoulder shrug are intact CN XII: Tongue is midline with normal movements and no atrophy.  MOTOR: There is no pronator drift of out-stretched arms. Muscle bulk and tone are normal. Muscle strength is normal.  REFLEXES: Reflexes are hypoactive and symmetric at the biceps, triceps, knees, and ankles. Plantar responses are flexor.  SENSORY: Length dependent decreased to light touch, pinprick and vibratory sensation at toes  COORDINATION: Rapid alternating movements and fine finger movements are intact. There is no dysmetria on finger-to-nose and heel-knee-shin.    GAIT/STANCE: He has to push up to get up from seated position, cautious, unsteady gait.   DIAGNOSTIC DATA (LABS, IMAGING, TESTING) - I reviewed patient records, labs, notes, testing and imaging myself where available.   ASSESSMENT AND PLAN  Chidiebere Wynn is a 84 y.o. male   Orthostatic dizziness Evidence of peripheral neuropathy Cognitive impairment  Complete evaluation with MRI of the brain  EMG nerve conduction study  Mestinon 60 mg 3 times a day for symptomatic treatment     Marcial Pacas, M.D. Ph.D.  [facility] [address] [place], [place] [address] Ph: [phone] Fax:  [phone]  CC: [name] Shiley, MD

## 2017-12-09 ENCOUNTER — Telehealth: Payer: Self-pay | Admitting: Neurology

## 2017-12-09 NOTE — Telephone Encounter (Signed)
Health team auth: [id number] (exp. 12/09/17 to 03/09/18)

## 2017-12-09 NOTE — Telephone Encounter (Signed)
health team faxed clinical notes order sent to GI they will reach out to the pt to schedule.

## 2017-12-10 ENCOUNTER — Telehealth: Payer: Self-pay | Admitting: Neurology

## 2017-12-10 NOTE — Telephone Encounter (Signed)
Spoke to his daughter on [place] - states patient is having GI upset and stomach cramps since starting Mestinon.  Additionally, he has not seen any benefit with his dizziness.  Per vo by Dr. Krista Blue, ok to stop the medication.  Keep pending appts for tests and follow up.  [name] is agreeable to this plan and will stop the medication.

## 2017-12-10 NOTE — Telephone Encounter (Signed)
Pts daughter called stating that pyridostigmine (MESTINON) 60 MG tablet has been causing s/e such as frequently using the bathroom and stomach pains.

## 2017-12-12 ENCOUNTER — Emergency Department (HOSPITAL_COMMUNITY)
Admission: EM | Admit: 2017-12-12 | Discharge: 2017-12-13 | Disposition: A | Discharge status: Home / Self Care | Payer: PPO | Attending: Emergency Medicine | Admitting: Emergency Medicine

## 2017-12-12 ENCOUNTER — Encounter (HOSPITAL_COMMUNITY): Payer: Self-pay | Admitting: Emergency Medicine

## 2017-12-12 ENCOUNTER — Emergency Department (HOSPITAL_COMMUNITY): Payer: PPO

## 2017-12-12 DIAGNOSIS — E039 Hypothyroidism, unspecified: Secondary | ICD-10-CM | POA: Insufficient documentation

## 2017-12-12 DIAGNOSIS — R0789 Other chest pain: Secondary | ICD-10-CM | POA: Diagnosis not present

## 2017-12-12 DIAGNOSIS — I2510 Atherosclerotic heart disease of native coronary artery without angina pectoris: Secondary | ICD-10-CM | POA: Diagnosis not present

## 2017-12-12 DIAGNOSIS — I130 Hypertensive heart and chronic kidney disease with heart failure and stage 1 through stage 4 chronic kidney disease, or unspecified chronic kidney disease: Secondary | ICD-10-CM | POA: Insufficient documentation

## 2017-12-12 DIAGNOSIS — Z7982 Long term (current) use of aspirin: Secondary | ICD-10-CM | POA: Insufficient documentation

## 2017-12-12 DIAGNOSIS — E119 Type 2 diabetes mellitus without complications: Secondary | ICD-10-CM | POA: Diagnosis not present

## 2017-12-12 DIAGNOSIS — X58XXXA Exposure to other specified factors, initial encounter: Secondary | ICD-10-CM | POA: Diagnosis not present

## 2017-12-12 DIAGNOSIS — Z79899 Other long term (current) drug therapy: Secondary | ICD-10-CM | POA: Diagnosis not present

## 2017-12-12 DIAGNOSIS — Y929 Unspecified place or not applicable: Secondary | ICD-10-CM | POA: Insufficient documentation

## 2017-12-12 DIAGNOSIS — S81811A Laceration without foreign body, right lower leg, initial encounter: Secondary | ICD-10-CM

## 2017-12-12 DIAGNOSIS — Y939 Activity, unspecified: Secondary | ICD-10-CM | POA: Insufficient documentation

## 2017-12-12 DIAGNOSIS — I5040 Unspecified combined systolic (congestive) and diastolic (congestive) heart failure: Secondary | ICD-10-CM | POA: Insufficient documentation

## 2017-12-12 DIAGNOSIS — R05 Cough: Secondary | ICD-10-CM | POA: Diagnosis not present

## 2017-12-12 DIAGNOSIS — M79604 Pain in right leg: Secondary | ICD-10-CM | POA: Diagnosis not present

## 2017-12-12 DIAGNOSIS — N183 Chronic kidney disease, stage 3 (moderate): Secondary | ICD-10-CM | POA: Diagnosis not present

## 2017-12-12 DIAGNOSIS — Z87891 Personal history of nicotine dependence: Secondary | ICD-10-CM | POA: Insufficient documentation

## 2017-12-12 DIAGNOSIS — Y999 Unspecified external cause status: Secondary | ICD-10-CM | POA: Diagnosis not present

## 2017-12-12 DIAGNOSIS — Z96652 Presence of left artificial knee joint: Secondary | ICD-10-CM | POA: Diagnosis not present

## 2017-12-12 DIAGNOSIS — R0602 Shortness of breath: Secondary | ICD-10-CM | POA: Diagnosis not present

## 2017-12-12 DIAGNOSIS — R58 Hemorrhage, not elsewhere classified: Secondary | ICD-10-CM | POA: Diagnosis not present

## 2017-12-12 DIAGNOSIS — R42 Dizziness and giddiness: Secondary | ICD-10-CM | POA: Diagnosis not present

## 2017-12-12 LAB — CBC
HEMATOCRIT: 33.6 % — AB (ref 39.0–52.0)
Hemoglobin: 10.9 g/dL — ABNORMAL LOW (ref 13.0–17.0)
MCH: 29.4 pg (ref 26.0–34.0)
MCHC: 32.4 g/dL (ref 30.0–36.0)
MCV: 90.6 fL (ref 78.0–100.0)
PLATELETS: 233 10*3/uL (ref 150–400)
RBC: 3.71 MIL/uL — AB (ref 4.22–5.81)
RDW: 14 % (ref 11.5–15.5)
WBC: 10.3 10*3/uL (ref 4.0–10.5)

## 2017-12-12 LAB — I-STAT TROPONIN, ED
TROPONIN I, POC: 0.02 ng/mL (ref 0.00–0.08)
Troponin i, poc: 0.01 ng/mL (ref 0.00–0.08)

## 2017-12-12 LAB — CBC WITH DIFFERENTIAL/PLATELET
BASOS ABS: 0 10*3/uL (ref 0.0–0.1)
BASOS PCT: 0 %
EOS PCT: 1 %
Eosinophils Absolute: 0.1 10*3/uL (ref 0.0–0.7)
HCT: 31 % — ABNORMAL LOW (ref 39.0–52.0)
Hemoglobin: 10.2 g/dL — ABNORMAL LOW (ref 13.0–17.0)
Lymphocytes Relative: 11 %
Lymphs Abs: 1.2 10*3/uL (ref 0.7–4.0)
MCH: 29.8 pg (ref 26.0–34.0)
MCHC: 32.9 g/dL (ref 30.0–36.0)
MCV: 90.6 fL (ref 78.0–100.0)
Monocytes Absolute: 0.5 10*3/uL (ref 0.1–1.0)
Monocytes Relative: 4 %
NEUTROS ABS: 8.9 10*3/uL — AB (ref 1.7–7.7)
Neutrophils Relative %: 84 %
PLATELETS: 210 10*3/uL (ref 150–400)
RBC: 3.42 MIL/uL — ABNORMAL LOW (ref 4.22–5.81)
RDW: 14.1 % (ref 11.5–15.5)
WBC: 10.7 10*3/uL — ABNORMAL HIGH (ref 4.0–10.5)

## 2017-12-12 LAB — URINALYSIS, ROUTINE W REFLEX MICROSCOPIC
BACTERIA UA: NONE SEEN
Bilirubin Urine: NEGATIVE
GLUCOSE, UA: NEGATIVE mg/dL
Ketones, ur: NEGATIVE mg/dL
LEUKOCYTES UA: NEGATIVE
NITRITE: NEGATIVE
PROTEIN: NEGATIVE mg/dL
Specific Gravity, Urine: 1.012 (ref 1.005–1.030)
Squamous Epithelial / LPF: NONE SEEN
pH: 5 (ref 5.0–8.0)

## 2017-12-12 LAB — COMPREHENSIVE METABOLIC PANEL
ALBUMIN: 3.1 g/dL — AB (ref 3.5–5.0)
ALT: 14 U/L — ABNORMAL LOW (ref 17–63)
AST: 30 U/L (ref 15–41)
Alkaline Phosphatase: 59 U/L (ref 38–126)
Anion gap: 13 (ref 5–15)
BUN: 30 mg/dL — ABNORMAL HIGH (ref 6–20)
CHLORIDE: 101 mmol/L (ref 101–111)
CO2: 18 mmol/L — ABNORMAL LOW (ref 22–32)
Calcium: 8.7 mg/dL — ABNORMAL LOW (ref 8.9–10.3)
Creatinine, Ser: 1.95 mg/dL — ABNORMAL HIGH (ref 0.61–1.24)
GFR calc Af Amer: 34 mL/min — ABNORMAL LOW (ref >60)
GFR calc non Af Amer: 29 mL/min — ABNORMAL LOW (ref >60)
GLUCOSE: 201 mg/dL — AB (ref 65–99)
POTASSIUM: 5.9 mmol/L — AB (ref 3.5–5.1)
Sodium: 132 mmol/L — ABNORMAL LOW (ref 135–145)
Total Bilirubin: 1 mg/dL (ref 0.3–1.2)
Total Protein: 6.4 g/dL — ABNORMAL LOW (ref 6.5–8.1)

## 2017-12-12 LAB — LIPASE, BLOOD: LIPASE: 36 U/L (ref 11–51)

## 2017-12-12 LAB — I-STAT CG4 LACTIC ACID, ED
LACTIC ACID, VENOUS: 1.7 mmol/L (ref 0.5–1.9)
Lactic Acid, Venous: 3.96 mmol/L (ref 0.5–1.9)

## 2017-12-12 LAB — MAGNESIUM: MAGNESIUM: 2.9 mg/dL — AB (ref 1.7–2.4)

## 2017-12-12 LAB — BRAIN NATRIURETIC PEPTIDE: B NATRIURETIC PEPTIDE 5: 93.3 pg/mL (ref 0.0–100.0)

## 2017-12-12 MED ORDER — SODIUM CHLORIDE 0.9 % IV BOLUS
500.0000 mL | Freq: Once | INTRAVENOUS | Status: AC
  Administered 2017-12-12: 500 mL via INTRAVENOUS

## 2017-12-12 NOTE — ED Provider Notes (Signed)
[facility] EMERGENCY DEPARTMENT Provider Note   CSN: [id number] Arrival date & time: 12/12/17  1817     History   Chief Complaint Chief Complaint  Patient presents with  . Extremity Laceration    on thinners  . Headache    HPI [name] Elliott is a 84 y.o. male.  The history is provided by the patient and medical records. No language interpreter was used.  Laceration   The incident occurred less than 1 hour ago. The laceration is located on the right leg. Size: 36mm. The laceration mechanism is unknown.The pain is at a severity of 0/10. The patient is experiencing no pain. The pain has been constant since onset. It is unknown if a foreign body is present. His tetanus status is UTD.    Past Medical History:  Diagnosis Date  . Arthritis   . Arthropathy, unspecified, site unspecified   . Atrial fibrillation ([place])   . Blood dyscrasia    thromboctopenia pt family states he was never told of this  . CAD (coronary artery disease)    last cath in 2012. Managed medically-some blockages  . Chronic lower back pain   . Chronic systolic CHF (congestive heart failure) ([place]) 06/04/2016  . Constipation, chronic   . Diabetes mellitus without complication ([place])    pt states he doesn't have  . DVT (deep venous thrombosis) ([place])   . Dysrhythmia   . Esophageal reflux   . 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 MRI Nov '11 - tendonosis and partial tear. Left shoulder surgery Jan '12 for partial shoulder replacement. [name])   . Headache(784.0)   . History of stomach ulcers 11/2012  . Hypothyroidism    pt states he doeen't have   . Orthostasis   . Other B-complex deficiencies   . Other malaise and fatigue   . Pain in joint, shoulder region    Has chronic shoulder pain  . Pain in limb   . Persistent disorder of initiating or maintaining sleep   . Pneumonia 1980's?; 2011  . Primary localized osteoarthritis of left knee 07/12/2015  . Pure  hypercholesterolemia   . PVC's (premature ventricular contractions)   . S/P cardiac cath 08/08/11   mild to moderate CAD primarily in the LAD. None are obstructive and appear stable from prior cath in 2007; managed medically  . Sebaceous cyst   . Stroke ([place])   . Thrombocytopenia, unspecified ([place])   . Unspecified essential hypertension   . Vascular dementia without behavioral disturbance    with periods of amnesia    Patient Active Problem List   Diagnosis Date Noted  . Memory loss 12/05/2017  . Orthostatic dizziness 12/05/2017  . Idiopathic peripheral neuropathy 12/05/2017  . PAF (paroxysmal atrial fibrillation) ([place]) 04/30/2017  . Shortness of breath 06/04/2016  . Chronic systolic CHF (congestive heart failure) ([place]) 06/04/2016  . Bilateral leg edema 12/12/2015  . Dizziness 09/27/2015  . Primary localized osteoarthritis of left knee 07/12/2015  . Weakness 02/23/2015  . DVT (deep venous thrombosis) ([place]) 12/14/2014  . Combined congestive systolic and diastolic heart failure ([place]) 12/14/2014  . Cerebral infarction due to unspecified mechanism   . TIA (transient ischemic attack) 08/01/2014  . Varicose veins of both legs with edema 05/07/2014  . Bilateral leg pain 04/16/2014  . Chronic kidney disease, stage III (moderate) ([place]) 12/21/2013  . Vascular dementia without behavioral disturbance   . Type 2 diabetes mellitus, controlled, with renal complications ([place]) 02/40/9735  . Diastolic dysfunction 32/99/2426  .  Knee pain, bilateral 03/01/2013  . Hereditary and idiopathic peripheral neuropathy 11/26/2012  . Osteoporosis, unspecified 11/26/2012  . GI bleed due to NSAIDs, DDX=Isch colitis, infectious colitis 11/19/2012  . Cough 05/29/2012  . 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 CONSTIPATION, CHRONIC 08/02/2010  . INSOMNIA, CHRONIC 03/08/2010  . Unspecified hypothyroidism 01/04/2010  . VITAMIN B12 DEFICIENCY 01/04/2010  . HYPERCHOLESTEROLEMIA 08/12/2009  .  Essential hypertension 08/12/2009  . Coronary artery disease involving native coronary artery of native heart without angina pectoris 08/12/2009  . GERD 08/12/2009    Past Surgical History:  Procedure Laterality Date  . BACK SURGERY    . CARDIAC CATHETERIZATION  08/2011  . CATARACT EXTRACTION Left 2009  . COLONOSCOPY    . ESOPHAGOGASTRODUODENOSCOPY N/A 11/19/2012   Procedure: ESOPHAGOGASTRODUODENOSCOPY (EGD);  Surgeon: Jerene Bears, MD;  Location: [facility];  Service: Gastroenterology;  Laterality: N/A;  . FINGER AMPUTATION Right    pinky finger  . HAND SURGERY Right    crush injury, right fifth digit contracture, limited  motion  . HARDWARE REMOVAL  02/05/2012   Procedure: HARDWARE REMOVAL;  Surgeon: Johnny Bridge, MD;  Location: [facility];  Service: Orthopedics;  Laterality: Left;  . KNEE SURGERY Left 1991   "did it twice in 1 wk" (02/17/2013)  . LUMBAR SPINE SURGERY  2007   Dr Philip Aspen ([place])  . REVERSE SHOULDER ARTHROPLASTY  02/05/2012   Procedure: REVERSE SHOULDER ARTHROPLASTY;  Surgeon: Johnny Bridge, MD;  Location: [facility];  Service: Orthopedics;  Laterality: Left;  . SHOULDER HEMI-ARTHROPLASTY    . SHOULDER OPEN ROTATOR CUFF REPAIR Left 8.30.2011  . TOTAL KNEE ARTHROPLASTY Left 07/12/2015   Procedure: TOTAL KNEE ARTHROPLASTY;  Surgeon: Marchia Bond, MD;  Location: [facility];  Service: Orthopedics;  Laterality: Left;  . US ECHOCARDIOGRAPHY  02-28-2010   Est EF 50-55%        Home Medications    Prior to Admission medications   Medication Sig Start Date End Date Taking? Authorizing Provider  aspirin EC 81 MG tablet Take 81 mg by mouth daily.     [provider]  bismuth subsalicylate (PEPTO-BISMOL) 262 MG/15ML suspension Take 30 mLs by mouth every 6 (six) hours as needed. Patient taking differently: Take 30 mLs by mouth every 6 (six) hours as needed for indigestion or diarrhea or loose stools.  03/19/17   McVey, Gelene Mink, PA-C  cephALEXin (KEFLEX) 500 MG  capsule Take 1 capsule (500 mg total) by mouth 2 (two) times daily. 11/01/17   Larene Pickett, PA-C  furosemide (LASIX) 40 MG tablet Take 1 tablet (40 mg total) by mouth every other day. Patient taking differently: Take 80 mg by mouth daily.  04/30/17 11/01/17  Nahser, Wonda Cheng, MD  gabapentin (NEURONTIN) 300 MG capsule Take 1 capsule (300 mg total) by mouth 3 (three) times daily. Needs office visit for additional refills 08/22/17   McVey, Gelene Mink, PA-C  lansoprazole (PREVACID) 30 MG capsule Take 2 capsules (60 mg total) by mouth daily at 12 noon. 03/19/17   McVey, Gelene Mink, PA-C  levothyroxine (SYNTHROID, LEVOTHROID) 50 MCG tablet Take 1 tablet (50 mcg total) by mouth daily. 11/15/17   McVey, Gelene Mink, PA-C  loratadine (CLARITIN) 10 MG tablet Take 1 tablet (10 mg total) by mouth daily. Patient taking differently: Take 10 mg by mouth daily as needed for allergies.  08/22/16   McVey, Gelene Mink, PA-C  magnesium hydroxide (MILK OF MAGNESIA) 400 MG/5ML suspension Take 15 mLs by mouth daily as  needed for mild constipation or moderate constipation.    [provider]  omeprazole (PRILOSEC) 20 MG capsule Take 1 capsule (20 mg total) by mouth 2 (two) times daily before a meal. 10/29/17   McVey, Gelene Mink, PA-C  potassium chloride SA (K-DUR,KLOR-CON) 20 MEQ tablet Take 1 tablet (20 mEq total) by mouth every other day. Patient taking differently: Take 20 mEq by mouth 2 (two) times daily.  04/30/17   Nahser, Wonda Cheng, MD  XARELTO 15 MG TABS tablet Take 1 tablet by mouth once daily 07/23/17   Nahser, Wonda Cheng, MD    Family History Family History  Problem Relation Age of Onset  . Breast cancer Mother   . Cancer Mother   . Diabetes Mother   . Cancer Brother        throat  . Stroke Brother   . Kidney disease Father   . Diabetes Sister   . Diabetes Daughter   . Colon cancer Neg Hx   . Heart attack Neg Hx     Social History Social History   Tobacco Use    . Smoking status: Former Smoker    Types: Cigars    Last attempt to quit: 09/03/1978    Years since quitting: 39.3  . Smokeless tobacco: Current User    Types: Chew  . Tobacco comment: 02/17/2013 "I aien't smoked a cigar in 20-30 yr"  Substance Use Topics  . Alcohol use: No    Alcohol/week: 0.0 oz  . Drug use: No     Allergies   Crestor [rosuvastatin calcium] and Mestinon [pyridostigmine bromide er]   Review of Systems Review of Systems  Constitutional: Positive for fatigue. Negative for chills, diaphoresis and fever.  HENT: Negative for congestion.   Respiratory: Positive for cough and shortness of breath. Negative for choking, chest tightness and wheezing.   Cardiovascular: Positive for chest pain.  Gastrointestinal: Positive for abdominal pain. Negative for constipation, diarrhea, nausea and vomiting.  Genitourinary: Positive for dysuria and frequency. Negative for flank pain.  Musculoskeletal: Negative for back pain, neck pain and neck stiffness.  Skin: Positive for wound. Negative for rash.  Neurological: Positive for light-headedness and headaches.  Psychiatric/Behavioral: Negative for agitation and confusion.  All other systems reviewed and are negative.    Physical Exam Updated Vital Signs BP 106/64 (BP Location: Right Arm)   Pulse 94   Resp (!) 21   Ht 5' (1.524 m)   Wt 76.2 kg (168 lb)   SpO2 98%   BMI 32.81 kg/m   Physical Exam  Constitutional: He appears well-developed and well-nourished.  Non-toxic appearance. He does not appear ill. No distress.  HENT:  Head: Normocephalic.  Nose: Nose normal.  Mouth/Throat: Oropharynx is clear and moist. No oropharyngeal exudate.  Eyes: Pupils are equal, round, and reactive to light. Conjunctivae and EOM are normal.  Neck: Normal range of motion. Neck supple.  Cardiovascular: Normal rate and intact distal pulses.  No murmur heard. Pulmonary/Chest: Effort normal. No tachypnea. No respiratory distress. He has rales  in the right lower field and the left lower field.  Abdominal: Soft. He exhibits no distension. There is no tenderness.  Musculoskeletal: He exhibits edema. He exhibits no tenderness.       Right lower leg: He exhibits edema.       Left lower leg: He exhibits edema.       Legs: Patient has a 1 mm laceration to the right lower shin.  Mild venous bleeding.  Patient has diffuse bilateral  leg edema.  Patient had DP and PT pulses palpated on both feet.  Normal sensation and strength.  Lymphadenopathy:    He has no cervical adenopathy.  Neurological: He is alert. No sensory deficit. He exhibits normal muscle tone.  Skin: Capillary refill takes less than 2 seconds. He is not diaphoretic. No erythema. No pallor.  Nursing note and vitals reviewed.    ED Treatments / Results  Labs (all labs ordered are listed, but only abnormal results are displayed) Labs Reviewed  CBC WITH DIFFERENTIAL/PLATELET - Abnormal; Notable for the following components:      Result Value   WBC 10.7 (*)    RBC 3.42 (*)    Hemoglobin 10.2 (*)    HCT 31.0 (*)    Neutro Abs 8.9 (*)    All other components within normal limits  COMPREHENSIVE METABOLIC PANEL - Abnormal; Notable for the following components:   Sodium 132 (*)    Potassium 5.9 (*)    CO2 18 (*)    Glucose, Bld 201 (*)    BUN 30 (*)    Creatinine, Ser 1.95 (*)    Calcium 8.7 (*)    Total Protein 6.4 (*)    Albumin 3.1 (*)    ALT 14 (*)    GFR calc non Af Amer 29 (*)    GFR calc Af Amer 34 (*)    All other components within normal limits  URINALYSIS, ROUTINE W REFLEX MICROSCOPIC - Abnormal; Notable for the following components:   Hgb urine dipstick SMALL (*)    All other components within normal limits  MAGNESIUM - Abnormal; Notable for the following components:   Magnesium 2.9 (*)    All other components within normal limits  CBC - Abnormal; Notable for the following components:   RBC 3.71 (*)    Hemoglobin 10.9 (*)    HCT 33.6 (*)    All other  components within normal limits  I-STAT CG4 LACTIC ACID, ED - Abnormal; Notable for the following components:   Lactic Acid, Venous 3.96 (*)    All other components within normal limits  URINE CULTURE  CULTURE, BLOOD (ROUTINE X 2)  CULTURE, BLOOD (ROUTINE X 2)  LIPASE, BLOOD  BRAIN NATRIURETIC PEPTIDE  I-STAT TROPONIN, ED  I-STAT CG4 LACTIC ACID, ED  I-STAT TROPONIN, ED    EKG EKG Interpretation  Date/Time:  Thursday December 12 2017 20:05:46 EDT Ventricular Rate:  97 PR Interval:    QRS Duration: 101 QT Interval:  367 QTC Calculation: 467 R Axis:   -134 Text Interpretation:  Sinus rhythm Right axis deviation Low voltage, precordial leads Borderline T abnormalities, anterior leads When compared to prior, no signifiacnt changes seen.  No STEMI Confirmed by Antony Blackbird [phone]) on 12/12/2017 8:26:24 PM   Radiology Dg Chest 2 View  Result Date: 12/12/2017 CLINICAL DATA:  Cough and fatigue EXAM: CHEST - 2 VIEW COMPARISON:  08/31/2017 FINDINGS: Status post left shoulder replacement. Hyperinflation. No focal airspace disease or effusion. Normal cardiomediastinal silhouette with aortic atherosclerosis. No pneumothorax. Stable marked compression deformity at the lower thoracic spine. IMPRESSION: No active cardiopulmonary disease. Electronically Signed   By: Donavan Foil M.D.   On: 12/12/2017 19:56    Procedures Procedures (including critical care time)  Medications Ordered in ED Medications  sodium chloride 0.9 % bolus 500 mL (0 mLs Intravenous Stopped 12/12/17 2107)     Initial Impression / Assessment and Plan / ED Course  I have reviewed the triage vital signs and the nursing  notes.  Pertinent labs & imaging results that were available during my care of the patient were reviewed by me and considered in my medical decision making (see chart for details).     [name] Elliott is a 84 y.o. male with a past medical history significant for CAD, CHF, stroke, vascular dementia,  diabetes, DVT, and atrial fibrillation on Xarelto and aspirin therapy who presents with several complaints including right lower leg bleeding, bilateral leg edema, fatigue, abdominal pain, chest pain, shortness of breath, dysuria, and productive cough.  Patient reports that his symptoms have been ongoing for the last few days.  He reports that he has had shortness of breath and cough producing a phlegm-like sputum.  He denies hemoptysis.  He reports that he has had some chest tightness and some shortness of breath.  He reports that he has had some chest discomfort which she reports as tightness.  He says it does not feel like prior heart troubles.  He reports diffuse abdominal discomfort but no nausea or vomiting.  He denies constipation or diarrhea.  He reports extreme fatigue and is very tired all the time.  The initial reason patient came to the emergency department was because he went to get the mail and when he looked down he was covered in blood.  According to EMS there were  "several pints of blood and "all over the ground and the patient's  right lower leg.  Patient denied any injury and denies any new pain in the leg.  EMS wrap the patient a pressure dressing and brought him to the ED for evaluation.    On my exam, patient has 2+ pitting edema in both of his lower extremities.  He reports that is worsened from prior.  He also has a pinpoint area of hemorrhage in the right medial shin that has a venous-like bleeding.  It is not pulsatile.  When the pressure dressing was removed, it began to slowly ooze.  Pressure dressing was reapplied.  Patient had palpable DP and PT pulse.  Normal sensation and strength in the ankle.  Area was nontender.  No evidence of cellulitis seen.  Patient's abdomen was nontender and chest was nontender.  Patient had rales and crackles in the bases bilaterally.  Based on patient's bleeding I am concerned he had worsening lower extremity edema that led to the patient having  spontaneous venous bleed.  As bleeding appears to be controlled with a pressure dressing will leave the dressing in place.  Do not feel patient needs sutures placed at this time.  Tetanus vaccine was confirmed to be up-to-date.  I am concerned about the patient's report of fatigue, and his infection symptoms of urinary frequency, productive cough, and the discomfort.  Patient will have chest x-ray, urinalysis, and blood work to further evaluate.  Patient's blood pressure was 93 systolic when I examined patient, will obtain labs to look for fluid overload or sepsis.  Patient will be on 500 cc of fluid to gently rehydrate in the setting of his soft blood pressure.    Anticipate reassessment after workup           Patient's diagnostic testing showed no evidence of UTI and no evidence of pneumonia.  CBC showed a hemoglobin of 10.2 which is concerning as his most recent one in the last few months was 13.  Review further back shows patient is normal in the 9-10 range on hemoglobin.  Patient will have this rechecked in several hours to make sure he  does not downtrending.  The patient's bleeding was controlled with pressure.  We did not find evidence of acute infection.  Patient's creatinine is similar to prior.  Magnesium is slightly elevated however BNP is not elevated.  Patient had complete resolution of all of his abdominal or chest symptoms.  He feels much better after some fluids.  Patient is no longer bleeding.  Repeat blood pressure was in the 347 systolic and patient was feeling well.  Repeat hemoglobin was improving.  Shared decision making conversation held with patient and family and decision was made to let patient go home.  Patient will follow up with PCP as it does not appear  he is still bleeding and it does not appear his hemoglobin is worsening.  As patient has no other symptoms, patient will be discharged with PCP follow-up.  Patient understood return precautions and was discharged in good  condition.   Final Clinical Impressions(s) / ED Diagnoses   Final diagnoses:  Laceration of right lower extremity, initial encounter    . Clinical Impression: 1. Laceration of right lower extremity, initial encounter     Disposition: Discharge  Condition: Good  I have discussed the results, Dx and Tx plan with the pt(& family if present). He/she/they expressed understanding and agree(s) with the plan. Discharge instructions discussed at great length. Strict return precautions discussed and pt &/or family have verbalized understanding of the instructions. No further questions at time of discharge.    New Prescriptions   No medications on file    Follow Up: No follow-up provider specified.    Jaylyn Booher, Gwenyth Allegra, MD 12/13/17 [phone]

## 2017-12-12 NOTE — ED Notes (Signed)
Patient transported to X-ray 

## 2017-12-12 NOTE — ED Triage Notes (Signed)
Per [facility] patient had a varicose vein pop today on right leg. Ems reports 2 quarts of blood on scene. Patient hx of taking blood xarelto. Reports one episode of diarrhea today and abdominal pain. Patient also reports headache since blood loss. Patient alert and oriented x 4. Patient from home. Lives alone.

## 2017-12-13 NOTE — Discharge Instructions (Signed)
Your workup today showed that your blood loss had stabilized on your repeat blood check.  We were able to control your bleeding with a bandage.  We did not feel he needed sutures placed.  We did not find evidence of other infection today.  Your blood pressures remained stable after rehydration and we feel you are safe for discharge home.  Please follow-up with your primary doctor in several days and if you develop any new or worsened symptoms, please return to the nearest emergency department.

## 2017-12-14 LAB — URINE CULTURE: Culture: <10000 — AB

## 2017-12-16 ENCOUNTER — Other Ambulatory Visit: Payer: Self-pay | Admitting: Physician Assistant

## 2017-12-16 DIAGNOSIS — G609 Hereditary and idiopathic neuropathy, unspecified: Secondary | ICD-10-CM

## 2017-12-16 NOTE — Telephone Encounter (Unsigned)
Copied from [facility] [phone]. Topic: Quick Communication - Rx Refill/Question >> Dec 16, 2017 11:31 AM Neva Seat wrote: gabapentin (NEURONTIN) 300 MG capsule Original dosage = 2 capsules  - 3 times a day The last bottle they received had different instructions. Please call pt's daughter to discuss and to have Rx refilled asap.

## 2017-12-16 NOTE — Telephone Encounter (Signed)
Per visit- 01/12/17- patient was taking 2 tablets 3 times/day   -  Daughter is requesting call back (attempted to call Ms. [name] Elliott- left message I was sending request into provider for review- please call back if she has anything to add.)  Somehow got decreased to 1 tablet 3 times/day  Rx refill request- gabapentin 300mg   LOV: 10/29/17  PCP: [place]: [facility]

## 2017-12-17 LAB — CULTURE, BLOOD (ROUTINE X 2)
CULTURE: NO GROWTH
Culture: NO GROWTH
SPECIAL REQUESTS: ADEQUATE
Special Requests: ADEQUATE

## 2017-12-18 ENCOUNTER — Other Ambulatory Visit: Payer: Self-pay

## 2017-12-18 ENCOUNTER — Ambulatory Visit (INDEPENDENT_AMBULATORY_CARE_PROVIDER_SITE_OTHER): Payer: PPO | Admitting: Physician Assistant

## 2017-12-18 ENCOUNTER — Encounter: Payer: Self-pay | Admitting: Physician Assistant

## 2017-12-18 VITALS — BP 120/70 | HR 80 | Temp 97.7°F | Resp 16 | Ht 60.0 in | Wt 149.6 lb

## 2017-12-18 DIAGNOSIS — G609 Hereditary and idiopathic neuropathy, unspecified: Secondary | ICD-10-CM

## 2017-12-18 DIAGNOSIS — Z862 Personal history of diseases of the blood and blood-forming organs and certain disorders involving the immune mechanism: Secondary | ICD-10-CM

## 2017-12-18 DIAGNOSIS — Z87898 Personal history of other specified conditions: Secondary | ICD-10-CM

## 2017-12-18 DIAGNOSIS — Z09 Encounter for follow-up examination after completed treatment for conditions other than malignant neoplasm: Secondary | ICD-10-CM | POA: Diagnosis not present

## 2017-12-18 DIAGNOSIS — D649 Anemia, unspecified: Secondary | ICD-10-CM

## 2017-12-18 LAB — POCT CBC
Granulocyte percent: 65.2 % (ref 37–80)
HCT, POC: 30.3 % — AB (ref 43.5–53.7)
Hemoglobin: 10 g/dL — AB (ref 14.1–18.1)
Lymph, poc: 0.2 — AB (ref 0.6–3.4)
MCH, POC: 29.2 pg (ref 27–31.2)
MCHC: 32.9 g/dL (ref 31.8–35.4)
MCV: 88.8 fL (ref 80–97)
MID (cbc): 0.5 (ref 0–0.9)
MPV: 7.5 fL (ref 0–99.8)
POC Granulocyte: 5 (ref 2–6.9)
POC LYMPH PERCENT: 28.9 % (ref 10–50)
POC MID %: 5.9 %M (ref 0–12)
Platelet Count, POC: 329 10*3/uL (ref 142–424)
RBC: 3.41 M/uL — AB (ref 4.69–6.13)
RDW, POC: 14.3 %
WBC: 7.7 10*3/uL (ref 4.6–10.2)

## 2017-12-18 MED ORDER — GABAPENTIN 300 MG PO CAPS
600.0000 mg | ORAL_CAPSULE | Freq: Three times a day (TID) | ORAL | 2 refills | Status: DC
Start: 2017-12-18 — End: 2017-12-25

## 2017-12-18 NOTE — Progress Notes (Signed)
[name] Elliott  MRN: [id number] DOB: April 21, 1928  PCP: Dorise Hiss, PA-C  Subjective:  Pt is an 84 year old male PMH HTN, CAD, TIA, DVT, CHF, CKD, GERD, hypothyroidism, DM who presents to clinic for hospital follow-up. She is here today with his daughter.    Right foot pain since 4/11 when of vein "popped".  Endorses pain above where this happened. History of A. fib on Xarelto and aspirin.  He has an appointment 4/23 with cardiologist.    He was in the hospital on 4/11 for laceration of his right leg with unknown mechanism of injury, bilateral leg edema, fatigue, abdominal pain, chest pain, shortness of breath, dysuria, and productive cough. no evidence of UTI and no evidence of pneumonia.  Symptoms and hemoglobin improved after IV fluids.  Review of Systems  Cardiovascular: Negative for leg swelling.  Musculoskeletal: Positive for arthralgias (Right foot). Negative for joint swelling.    Patient Active Problem List   Diagnosis Date Noted  . Memory loss 12/05/2017  . Orthostatic dizziness 12/05/2017  . Idiopathic peripheral neuropathy 12/05/2017  . PAF (paroxysmal atrial fibrillation) ([place]) 04/30/2017  . Shortness of breath 06/04/2016  . Chronic systolic CHF (congestive heart failure) ([place]) 06/04/2016  . Bilateral leg edema 12/12/2015  . Dizziness 09/27/2015  . Primary localized osteoarthritis of left knee 07/12/2015  . Weakness 02/23/2015  . DVT (deep venous thrombosis) ([place]) 12/14/2014  . Combined congestive systolic and diastolic heart failure ([place]) 12/14/2014  . Cerebral infarction due to unspecified mechanism   . TIA (transient ischemic attack) 08/01/2014  . Varicose veins of both legs with edema 05/07/2014  . Bilateral leg pain 04/16/2014  . Chronic kidney disease, stage III (moderate) ([place]) 12/21/2013  . Vascular dementia without behavioral disturbance   . Type 2 diabetes mellitus, controlled, with renal complications ([place]) 46/96/2952  . Diastolic  dysfunction 84/13/2440  . Knee pain, bilateral 03/01/2013  . Hereditary and idiopathic peripheral neuropathy 11/26/2012  . Osteoporosis, unspecified 11/26/2012  . GI bleed due to NSAIDs, DDX=Isch colitis, infectious colitis 11/19/2012  . Cough 05/29/2012  . 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 CONSTIPATION, CHRONIC 08/02/2010  . INSOMNIA, CHRONIC 03/08/2010  . Unspecified hypothyroidism 01/04/2010  . VITAMIN B12 DEFICIENCY 01/04/2010  . HYPERCHOLESTEROLEMIA 08/12/2009  . Essential hypertension 08/12/2009  . Coronary artery disease involving native coronary artery of native heart without angina pectoris 08/12/2009  . GERD 08/12/2009    Current Outpatient Medications on File Prior to Visit  Medication Sig Dispense Refill  . aspirin EC 81 MG tablet Take 81 mg by mouth daily.     Marland Kitchen bismuth subsalicylate (PEPTO-BISMOL) 262 MG/15ML suspension Take 30 mLs by mouth every 6 (six) hours as needed. (Patient taking differently: Take 30 mLs by mouth every 6 (six) hours as needed for indigestion or diarrhea or loose stools. ) 360 mL 0  . gabapentin (NEURONTIN) 300 MG capsule Take 1 capsule (300 mg total) by mouth 3 (three) times daily. Needs office visit for additional refills 540 capsule 0  . lansoprazole (PREVACID) 30 MG capsule Take 2 capsules (60 mg total) by mouth daily at 12 noon. 2 capsule 0  . levothyroxine (SYNTHROID, LEVOTHROID) 50 MCG tablet Take 1 tablet (50 mcg total) by mouth daily. 90 tablet 3  . magnesium hydroxide (MILK OF MAGNESIA) 400 MG/5ML suspension Take 15 mLs by mouth daily as needed for mild constipation or moderate constipation.    Marland Kitchen omeprazole (PRILOSEC) 20 MG capsule Take 1 capsule (20 mg total) by mouth  2 (two) times daily before a meal. 180 capsule 3  . potassium chloride SA (K-DUR,KLOR-CON) 20 MEQ tablet Take 1 tablet (20 mEq total) by mouth every other day. (Patient taking differently: Take 20 mEq by mouth 2 (two) times daily. ) 90 tablet 3  . XARELTO  15 MG TABS tablet Take 1 tablet by mouth once daily 90 tablet 1  . furosemide (LASIX) 40 MG tablet Take 1 tablet (40 mg total) by mouth every other day. (Patient taking differently: Take 80 mg by mouth daily. ) 90 tablet 3  . loratadine (CLARITIN) 10 MG tablet Take 1 tablet (10 mg total) by mouth daily. (Patient not taking: Reported on 12/12/2017) 30 tablet 1   No current facility-administered medications on file prior to visit.     Allergies  Allergen Reactions  . Crestor [Rosuvastatin Calcium] Other (See Comments)    Muscle pain/aches  . Mestinon [Pyridostigmine Bromide Er] Other (See Comments)    GI upset, stomach cramps     Objective:  BP 120/70   Pulse 80   Temp 97.7 F (36.5 C) (Oral)   Resp 16   Ht 5' (1.524 m)   Wt 149 lb 9.6 oz (67.9 kg)   BMI 29.22 kg/m   Physical Exam  Constitutional: He is oriented to person, place, and time. He appears well-developed and well-nourished.  Cardiovascular: Normal rate and regular rhythm.  Musculoskeletal:       Right lower leg: He exhibits no edema.       Left lower leg: He exhibits no edema.       Feet:  Mild petechiae, no bleeding.  Neurological: He is alert and oriented to person, place, and time.  Skin: Skin is warm and dry.  Psychiatric: He has a normal mood and affect. His behavior is normal. Judgment and thought content normal.  Vitals reviewed.   Results for orders placed or performed in visit on 12/18/17  POCT CBC  Result Value Ref Range   WBC 7.7 4.6 - 10.2 K/uL   Lymph, poc 0.2 (A) 0.6 - 3.4   POC LYMPH PERCENT 28.9 10 - 50 %L   MID (cbc) 0.5 0 - 0.9   POC MID % 5.9 0 - 12 %M   POC Granulocyte 5.0 2 - 6.9   Granulocyte percent 65.2 37 - 80 %G   RBC 3.41 (A) 4.69 - 6.13 M/uL   Hemoglobin 10.0 (A) 14.1 - 18.1 g/dL   HCT, POC 30.3 (A) 43.5 - 53.7 %   MCV 88.8 80 - 97 fL   MCH, POC 29.2 27 - 31.2 pg   MCHC 32.9 31.8 - 35.4 g/dL   RDW, POC 14.3 %   Platelet Count, POC 329 142 - 424 K/uL   MPV 7.5 0 - 99.8 fL      Assessment and Plan :  1. History of unexplained bleeding 2. Hereditary and idiopathic peripheral neuropathy 3. Low hemoglobin 4. Encounter for examination following treatment at hospital -Patient complains of small areas of bleeding lower right foot.  Hemoglobin is 10.  Platelets are normal.  Will check iron, reticulocyte sites, Path smear.  He is otherwise asymptomatic.  He has an appointment with his cardiologist next week.  Will contact with lab results and plan. - Iron, TIBC and Ferritin Panel - Reticulocytes - Pathologist smear review - POCT CBC  Mercer Pod, PA-C  [facility] 12/18/2017 4:19 PM

## 2017-12-18 NOTE — Patient Instructions (Addendum)
  We will be in contact with the results of your lab work - we may refer you to hematology if needed.  Please go to the emergency department if you become symptomatic. Keep your appointment with cardiology.    Thank you for coming in today. I hope you feel we met your needs.  Feel free to call PCP if you have any questions or further requests.  Please consider signing up for MyChart if you do not already have it, as this is a great way to communicate with me.  Best,  Whitney McVey, PA-C   IF you received an x-ray today, you will receive an invoice from [facility]. Please contact [facility] at [phone] with questions or concerns regarding your invoice.   IF you received labwork today, you will receive an invoice from [facility]. Please contact [facility] at [phone] with questions or concerns regarding your invoice.   Our billing staff will not be able to assist you with questions regarding bills from these companies.  You will be contacted with the lab results as soon as they are available. The fastest way to get your results is to activate your My Chart account. Instructions are located on the last page of this paperwork. If you have not heard from [place] regarding the results in 2 weeks, please contact this office.

## 2017-12-19 ENCOUNTER — Telehealth: Payer: Self-pay | Admitting: Physician Assistant

## 2017-12-19 NOTE — Telephone Encounter (Unsigned)
Copied from [place] [phone]. Topic: Quick Communication - See Telephone Encounter >> Dec 19, 2017  8:36 AM Aurelio Brash B wrote: CRM for notification. See Telephone encounter for: 12/19/17.  Marita Kansas from [facility] called about the pts gabapentin (NEURONTIN) 300 MG capsule  increase wants to make sure that is accurate and also wants 90 day supply.   Marita Kansas  [phone]  Can leave detail message and leave full name

## 2017-12-19 NOTE — Telephone Encounter (Signed)
Please verify that this is correct dose for pt.

## 2017-12-20 ENCOUNTER — Telehealth: Payer: Self-pay | Admitting: Physician Assistant

## 2017-12-20 NOTE — Telephone Encounter (Signed)
Copied from [place] [phone]. Topic: Quick Communication - Rx Refill/Question >> Dec 20, 2017 10:23 AM Robina Ade, Helene Kelp D wrote: Medication: gabapentin (NEURONTIN) 300 MG capsule Has the patient contacted their pharmacy? Yes (Agent: If no, request that the patient contact the pharmacy for the refill.) Preferred Pharmacy (with phone number or street name):[facility] [place], [place] Agent: Please be advised that RX refills may take up to 3 business days. We ask that you follow-up with your pharmacy.  Merrilee Seashore with [facility] needs to talk to someone regarding the increase on medication for patient and request for 90 day supply.

## 2017-12-20 NOTE — Telephone Encounter (Signed)
Merrilee Seashore with [facility] needs to talk to someone regarding the increase on medication for patient and request for 90 day supply. They are needing clarification of the dosage.  Last OV:12/18/17 PCP: [place]: [facility] [place], [place] [phone] (Phone) [phone] (Fax)

## 2017-12-20 NOTE — Telephone Encounter (Unsigned)
Copied from [place] [phone]. Topic: Quick Communication - Rx Refill/Question >> Dec 20, 2017 10:23 AM Robina Ade, Helene Kelp D wrote: Medication: gabapentin (NEURONTIN) 300 MG capsule Has the patient contacted their pharmacy? Yes (Agent: If no, request that the patient contact the pharmacy for the refill.) Preferred Pharmacy (with phone number or street name):[facility] [place], [place] Agent: Please be advised that RX refills may take up to 3 business days. We ask that you follow-up with your pharmacy.  Merrilee Seashore with [facility] needs to talk to someone regarding the increase on medication for patient and request for 90 day supply.

## 2017-12-20 NOTE — Telephone Encounter (Signed)
Informed pharmacy waiting to hear from provider on Neurontin dosage.

## 2017-12-23 NOTE — Telephone Encounter (Signed)
Patient's caregiver called and wanted to check the status of this. She said it needs to be 90 days

## 2017-12-23 NOTE — Telephone Encounter (Signed)
Please verify dosage change for Gabapentin. No information listed in your note for med increase.

## 2017-12-24 ENCOUNTER — Ambulatory Visit: Payer: PPO | Admitting: Cardiovascular Disease

## 2017-12-24 ENCOUNTER — Encounter: Payer: Self-pay | Admitting: Cardiovascular Disease

## 2017-12-24 VITALS — BP 130/68 | HR 62 | Ht 60.0 in | Wt 150.5 lb

## 2017-12-24 DIAGNOSIS — I5022 Chronic systolic (congestive) heart failure: Secondary | ICD-10-CM | POA: Diagnosis not present

## 2017-12-24 DIAGNOSIS — I480 Paroxysmal atrial fibrillation: Secondary | ICD-10-CM | POA: Diagnosis not present

## 2017-12-24 DIAGNOSIS — I2510 Atherosclerotic heart disease of native coronary artery without angina pectoris: Secondary | ICD-10-CM | POA: Diagnosis not present

## 2017-12-24 NOTE — Telephone Encounter (Signed)
Provider, pharmacy is requesting a 90 day supply.  OK to dispense #540 tabs? Any refills?

## 2017-12-24 NOTE — Telephone Encounter (Signed)
OK to fill this dose change at 90 days. Thank you

## 2017-12-24 NOTE — Progress Notes (Signed)
Cardiology Office Note   Date:  12/24/2017   ID:  [name] Elliott, DOB [birth year], MRN [id number]  PCP:  Dorise Hiss, PA-C  Cardiologist:   Mertie Moores, MD   Chief Complaint  Patient presents with  . Coronary Artery Disease   Problem list:  1. Moderate coronary artery disease 2. Dementia 3. Peripheral vascular disease 4. CVA 5.  Paroxysmal atrial fib      84 year old gentleman with a history of moderate diffuse coronary artery disease. We treated medically. The left anterior descending arteries/ 1st diagonal stenosis has not was not suitable for PCI.  He complains of being sick in general is weak sensation. He also complains of some left arm pain. When I saw him last month. He was having some episodes of chest pain. We were trying to decide whether or not these were due to angina. I asked him to take nitroglycerin when he had these episodes of pain. At times the nitroglycerin helps him and at other times the nitroglycerin did not do anything. When he walks up a hill in his backyard he has significant shortness breath and this "sick feeling".  He has had a partial shoulder replacement for a shoulder fracture. He has had chronic shoulder pain since that time.  He has chronic knee pain and is considering knee surgery. His office visit was for the purpose of preoperative evaluation.  Sept. 17, 2013- He has no cardiac complaints. He denies any chest pain or dyspnea. He still has some left shoulder stiffness from his surgery February 05, 2012. He has had some wheezing and was recently started on steroids and an antibiotic.  He also ws diagnosed with peripheral neurophy He was prescribed gabapentin but his daughter did not fill the medication because it was too expensive.  She bought some over-the-counter vitamin B12 which seems to be helping a little bit.  November 26, 2012:  Author was recently hospitalized last week for peptic ulcer Disease. He had vomiting of  coffee-ground emesis. He also had blood in his stool. [facility] discontinued his Gabriel Earing powders and his Fosamax. He seems to be doing well from a cardiac standpoint. No angina.  He is having trouble with leg pain.   Jan. 20, 2015:  No CP. He has lots of fatigue especially if he tries to walk any distance.  April 21, 2014,   November 02, 2014;  [name] Elliott is a 84 y.o. male who presents for his coronary artery disease.   He was seen by Richardson Dopp several weeks ago. He had a stroke back in November. We've placed a event monitor looking for atrial fib . There was no evidence of atrial fibrillation on the monitor. There is some mention of atrial fibrillation in past charts but we've not been able to locate any confirmation of that.  He complains of leg pain today.   He keeps his legs elevated 3-4 hours a day.  Has seen [facility] and they had no solutions for his leg pain.    No additional stroke symptoms.  He was seen with his granddaughter this am.  Aug. 30, 2016:  He has been diagnosed with DVTs.  Has been on xarelto. Needs to have knee  Replacement .   Feb. 28, 2017: Still having lot of left leg swelling from his left TKA . No CP . Breathing is ok Unable to walk very far at all   Oct. 2, 2017:  Is having some shortness of breath  - especially with any  exertion  Last echo was 08/02/14 and showed LVEF of 45-50%. Grade 1 DD.  Mild - mod MR  Eats lots of salty foods according to family Has some leg swelling   Jan 01, 2017:  [name] Elliott is seen today for follow up visit atrial fib, cad, and chronic combined CHF  Has been having lots of dizziness and shortness of breath recently .   Was seen at [facility] He gets dizzy,  Has profound leg weakness.  At times he has orthostatic hypotension   Has headaches.   Reported some chest pain last week that worsened when he moved his arm    04/30/2017:  [name] Elliott is seen today for follow-up visit. He does not have any cardiac complaints.  He complains of feeling very weak and not having any energy. His blood pressure has been low and he has had orthostatic hypotension in the past. He has already stopped taking isosorbide mononitrate.  December 24, 2017:  Seen with [name] Elliott ( daughter)  [name] Elliott is seen back today for follow-up of his coronary artery disease.  He has a history of paroxysmal atrial fibrillation.  He is been on Xarelto 15 mg a day.  He recently was seen in the emergency room with a leg injury.   Had some leg swellnig - a vericose vein ruptured.  Hb dropped from 13 to 10 .   Also had a severe nose bleed in March   Is still eating a very high salt diet   Has seen [facility] neuro for his orthostatic hypotension ( feels " drunk " ) Tried mestinon 60 mg TID but did not tolerate it .    Past Medical History:  Diagnosis Date  . Arthritis   . Arthropathy, unspecified, site unspecified   . Atrial fibrillation ([place])   . Blood dyscrasia    thromboctopenia pt family states he was never told of this  . CAD (coronary artery disease)    last cath in 2012. Managed medically-some blockages  . Chronic lower back pain   . Chronic systolic CHF (congestive heart failure) ([place]) 06/04/2016  . Constipation, chronic   . Diabetes mellitus without complication ([place])    pt states he doesn't have  . DVT (deep venous thrombosis) ([place])   . Dysrhythmia   . Esophageal reflux   . 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 MRI Nov '11 - tendonosis and partial tear. Left shoulder surgery Jan '12 for partial shoulder replacement. [name])   . Headache(784.0)   . History of stomach ulcers 11/2012  . Hypothyroidism    pt states he doeen't have   . Orthostasis   . Other B-complex deficiencies   . Other malaise and fatigue   . Pain in joint, shoulder region    Has chronic shoulder pain  . Pain in limb   . Persistent disorder of initiating or maintaining sleep   . Pneumonia 1980's?; 2011  . Primary localized  osteoarthritis of left knee 07/12/2015  . Pure hypercholesterolemia   . PVC's (premature ventricular contractions)   . S/P cardiac cath 08/08/11   mild to moderate CAD primarily in the LAD. None are obstructive and appear stable from prior cath in 2007; managed medically  . Sebaceous cyst   . Stroke ([place])   . Thrombocytopenia, unspecified ([place])   . Unspecified essential hypertension   . Vascular dementia without behavioral disturbance    with periods of amnesia    Past Surgical History:  Procedure Laterality Date  .  BACK SURGERY    . CARDIAC CATHETERIZATION  08/2011  . CATARACT EXTRACTION Left 2009  . COLONOSCOPY    . ESOPHAGOGASTRODUODENOSCOPY N/A 11/19/2012   Procedure: ESOPHAGOGASTRODUODENOSCOPY (EGD);  Surgeon: Jerene Bears, MD;  Location: [facility];  Service: Gastroenterology;  Laterality: N/A;  . FINGER AMPUTATION Right    pinky finger  . HAND SURGERY Right    crush injury, right fifth digit contracture, limited  motion  . HARDWARE REMOVAL  02/05/2012   Procedure: HARDWARE REMOVAL;  Surgeon: Johnny Bridge, MD;  Location: [facility];  Service: Orthopedics;  Laterality: Left;  . KNEE SURGERY Left 1991   "did it twice in 1 wk" (02/17/2013)  . LUMBAR SPINE SURGERY  2007   Dr  Aspen (Ali Molina)  . REVERSE SHOULDER ARTHROPLASTY  02/05/2012   Procedure: REVERSE SHOULDER ARTHROPLASTY;  Surgeon: Johnny Bridge, MD;  Location: [facility];  Service: Orthopedics;  Laterality: Left;  . SHOULDER HEMI-ARTHROPLASTY    . SHOULDER OPEN ROTATOR CUFF REPAIR Left 8.30.2011  . TOTAL KNEE ARTHROPLASTY Left 07/12/2015   Procedure: TOTAL KNEE ARTHROPLASTY;  Surgeon: Marchia Bond, MD;  Location: [facility];  Service: Orthopedics;  Laterality: Left;  . US ECHOCARDIOGRAPHY  02-28-2010   Est EF 50-55%     Current Outpatient Medications  Medication Sig Dispense Refill  . aspirin EC 81 MG tablet Take 81 mg by mouth daily.     Marland Kitchen bismuth subsalicylate (PEPTO BISMOL) 262 MG/15ML suspension Take 30 mLs by mouth  as directed.    . furosemide (LASIX) 40 MG tablet Take 40 mg by mouth as directed.    . gabapentin (NEURONTIN) 300 MG capsule Take 2 capsules (600 mg total) by mouth 3 (three) times daily. 180 capsule 2  . lansoprazole (PREVACID) 30 MG capsule Take 2 capsules (60 mg total) by mouth daily at 12 noon. 2 capsule 0  . levothyroxine (SYNTHROID, LEVOTHROID) 50 MCG tablet Take 1 tablet (50 mcg total) by mouth daily. 90 tablet 3  . magnesium hydroxide (MILK OF MAGNESIA) 400 MG/5ML suspension Take 15 mLs by mouth daily as needed for mild constipation or moderate constipation.    Marland Kitchen omeprazole (PRILOSEC) 20 MG capsule Take 1 capsule (20 mg total) by mouth 2 (two) times daily before a meal. 180 capsule 3  . potassium chloride SA (K-DUR,KLOR-CON) 20 MEQ tablet Take 20 mEq by mouth 2 (two) times daily.    Alveda Reasons 15 MG TABS tablet Take 1 tablet by mouth once daily 90 tablet 1   No current facility-administered medications for this visit.     Allergies:   Crestor [rosuvastatin calcium] and Mestinon [pyridostigmine bromide er]    Social History:  The patient  reports that he quit smoking about 39 years ago. His smoking use included cigars. His smokeless tobacco use includes chew. He reports that he does not drink alcohol or use drugs.   Family History:  The patient's family history includes Breast cancer in his mother; Cancer in his brother and mother; Diabetes in his daughter, mother, and sister; Kidney disease in his father; Stroke in his brother.    ROS: Noted in current history, all other systems are negative.   Physical Exam: Blood pressure 130/68, pulse 62, height 5' (1.524 m), weight 150 lb 8 oz (68.3 kg), SpO2 99 %.  GEN:  Elderly , frail , chronicaly appearing male,   Responses are very slow  HEENT: Normal NECK: No JVD; No carotid bruits LYMPHATICS: No lymphadenopathy CARDIAC: RRR   RESPIRATORY:  Clear to auscultation  without rales, wheezing or rhonchi  ABDOMEN: Soft, non-tender,  non-distended MUSCULOSKELETAL:  No edema; No deformity  SKIN: Warm and dry NEUROLOGIC:   Slow to respond,  Gait is slow    EKG:    Recent Labs: 10/29/2017: TSH 6.580 12/12/2017: ALT 14; B Natriuretic Peptide 93.3; BUN 30; Creatinine, Ser 1.95; Magnesium 2.9; Potassium 5.9; Sodium 132 12/18/2017: Hemoglobin 10.1; Platelets 352   Lipid Panel    Component Value Date/Time   CHOL 167 03/16/2017 1110   TRIG 79 03/16/2017 1110   HDL 62 03/16/2017 1110   CHOLHDL 2.7 03/16/2017 1110   CHOLHDL 2.5 06/04/2016 0857   VLDL 9 06/04/2016 0857   LDLCALC 89 03/16/2017 1110   LDLDIRECT 140.6 09/25/2013 1030      Wt Readings from Last 3 Encounters:  12/24/17 150 lb 8 oz (68.3 kg)  12/18/17 149 lb 9.6 oz (67.9 kg)  12/12/17 168 lb (76.2 kg)      Other studies Reviewed: Additional studies/ records that were reviewed today include:  Review of the above records demonstrates:    ASSESSMENT AND PLAN:  1.  - Generalized weakness:   He is seeing neurology.  2. Moderate coronary artery disease -   no episodes of angina.   3. Peripheral vascular disease- .   4. Paroxysmal atrial fib:    He is in normal sinus rhythm today.  He has had 2 significant bleeds over the past month.  We will stop Xarelto.  I had a long discussion with his daughter [name] Elliott about the increased risk of DVT and/or increased risk of stroke.  Considered Eliquis twice a day but she is worried that he might have another major hemorrhagic event. We will continue to follow.   4. CVA -   5. DVT:   -He is having significant bleeding episodes.  We will need to stop the Xarelto.    6. Knee arthritis:  .  7. Chronic combined systolic and diastolic congestive heart failure :    He still eats a very high salt diet.    Current medicines are reviewed at length with the patient today.  The patient does not have concerns regarding medicines.  The following changes have been made:  no change   Disposition:   FU with me in 6   months      Mertie Moores, MD  12/24/2017 8:58 AM    [address], [place], [place]  [address] Phone: [phone]; Fax: [phone]

## 2017-12-24 NOTE — Telephone Encounter (Signed)
This is correct.  Thank you

## 2017-12-24 NOTE — Patient Instructions (Addendum)
Medication Instructions:  Your physician has recommended you make the following change in your medication:   STOP Xarelto due to bleeding   Labwork: None Ordered   Testing/Procedures: None Ordered   Follow-Up: Your physician wants you to follow-up in: 6 months with Dr. Acie Fredrickson. You will receive a reminder letter in the mail two months in advance. If you don't receive a letter, please call our office to schedule the follow-up appointment.   If you need a refill on your cardiac medications before your next appointment, please call your pharmacy.   Thank you for choosing [facility]! Christen Bame, RN [phone]

## 2017-12-25 ENCOUNTER — Other Ambulatory Visit: Payer: Self-pay

## 2017-12-25 DIAGNOSIS — G609 Hereditary and idiopathic neuropathy, unspecified: Secondary | ICD-10-CM

## 2017-12-25 MED ORDER — GABAPENTIN 300 MG PO CAPS: 600.0000 mg | ORAL_CAPSULE | Freq: Three times a day (TID) | ORAL | 1 refills | Status: DC

## 2017-12-25 NOTE — Telephone Encounter (Signed)
Rx: gabapentin for 90 day supply sent to pharmacy.

## 2017-12-25 NOTE — Telephone Encounter (Signed)
OK to fill 90 day supply. Give 1 refill.

## 2017-12-26 ENCOUNTER — Telehealth: Payer: Self-pay | Admitting: Physician Assistant

## 2017-12-26 NOTE — Telephone Encounter (Signed)
Spoke to [place] at [facility] Confirmed dose of 600mg  -3 times per day

## 2017-12-26 NOTE — Telephone Encounter (Signed)
Copied from [place]. Topic: Quick Communication - See Telephone Encounter >> Dec 26, 2017 10:15 AM Synthia Innocent wrote: CRM for notification. See Telephone encounter for: 12/26/17. Need clarification on dosage of gabapentin (NEURONTIN) 300 MG capsule

## 2017-12-26 NOTE — Telephone Encounter (Signed)
Done 4/24

## 2017-12-27 ENCOUNTER — Other Ambulatory Visit: Payer: Self-pay

## 2017-12-27 LAB — PATHOLOGIST SMEAR REVIEW
Basophils Absolute: 0 10*3/uL (ref 0.0–0.2)
Basos: 1 %
EOS (ABSOLUTE): 0.1 10*3/uL (ref 0.0–0.4)
Eos: 1 %
Hematocrit: 31.2 % — ABNORMAL LOW (ref 37.5–51.0)
Hemoglobin: 10.1 g/dL — ABNORMAL LOW (ref 13.0–17.7)
Immature Grans (Abs): 0 10*3/uL (ref 0.0–0.1)
Immature Granulocytes: 0 %
Lymphocytes Absolute: 2.3 10*3/uL (ref 0.7–3.1)
Lymphs: 27 %
MCH: 29.3 pg (ref 26.6–33.0)
MCHC: 32.4 g/dL (ref 31.5–35.7)
MCV: 90 fL (ref 79–97)
Monocytes Absolute: 0.5 10*3/uL (ref 0.1–0.9)
Monocytes: 6 %
Neutrophils Absolute: 5.5 10*3/uL (ref 1.4–7.0)
Neutrophils: 65 %
Path Rev PLTs: NORMAL
Path Rev WBC: NORMAL
Platelets: 352 10*3/uL (ref 150–379)
RBC: 3.45 x10E6/uL — ABNORMAL LOW (ref 4.14–5.80)
RDW: 14.2 % (ref 12.3–15.4)
WBC: 8.5 10*3/uL (ref 3.4–10.8)

## 2017-12-27 LAB — IRON,TIBC AND FERRITIN PANEL
Ferritin: 85 ng/mL (ref 30–400)
Iron Saturation: 6 % — CL (ref 15–55)
Iron: 28 ug/dL — ABNORMAL LOW (ref 38–169)
Total Iron Binding Capacity: 437 ug/dL (ref 250–450)
UIBC: 409 ug/dL — ABNORMAL HIGH (ref 111–343)

## 2017-12-27 LAB — RETICULOCYTES: Retic Ct Pct: 3.1 % — ABNORMAL HIGH (ref 0.6–2.6)

## 2017-12-27 NOTE — Patient Outreach (Signed)
[facility]) Care Management  12/27/2017  Willoughby Doell September 20, 1927 [id number]  TELEPHONE SCREENING Referral date: 12/19/17 Referral source: Utilization management Referral reason: Daughter concern for patients well- being/ would like home health assistance and medication copay assistance.  Insurance: Health team advantage Attempt #1  Telephone call to patients daughter and designated party release, [name]. Unable to reach. HIPAA compliant voice message left with call back phone number.   PLAN: RNCM will attempt 2nd telephone call to patients daughter within 4 business days.  RNCM will send outreach letter to attempt contact.   Quinn Plowman RN,BSN,CCM [facility] Telephonic  [phone]

## 2018-01-01 NOTE — Progress Notes (Signed)
Spoke to pt's daughter on the phone. Plan to contact nephrologist regarding iron infusion. His next appt is in about 6 months - advised to ask about f/u with them sooner.

## 2018-01-02 ENCOUNTER — Other Ambulatory Visit: Payer: Self-pay

## 2018-01-02 ENCOUNTER — Other Ambulatory Visit: Payer: PPO

## 2018-01-02 NOTE — Patient Outreach (Signed)
[facility]) Care Management  01/02/2018  [name] Elliott 1927-11-01 [id number]   TELEPHONE SCREENING Referral date: 12/19/17 Referral source: Utilization management Referral reason: Daughter concern for patients well- being/ would like home health assistance and medication copay assistance.  Insurance: Health team advantage Attempt #2  Telephone call to patients daughter and designated party release, [name] Elliott. Unable to reach. HIPAA compliant voice message left with call back phone number.   PLAN: RNCM will attempt 3rd telephone call to patients daughter within 4 business days.   Quinn Plowman RN,BSN,CCM [facility] Telephonic  [phone]

## 2018-01-07 ENCOUNTER — Other Ambulatory Visit: Payer: Self-pay

## 2018-01-07 NOTE — Patient Outreach (Signed)
[facility]) Care Management  01/07/2018  [name] Elliott 1928-01-12 [id number]   TELEPHONE SCREENING Referral date:12/19/17 Referral source:Utilization management Referral reason:Daughter concern for patients well- being/ would like home health assistance and medication copay assistance. Insurance:Health team advantage Attempt #3  Telephone call to patients daughter and designated party release, [name]. Unable to reach. Message states Mailbox is full.   Attempted call to listed home number. HIPAA compliant message left with  call back phone number.  PLAN:If no return call will proceed with case closure.   Quinn Plowman RN,BSN,CCM [facility] Telephonic  [phone]

## 2018-01-08 ENCOUNTER — Encounter (HOSPITAL_COMMUNITY): Payer: PPO

## 2018-01-10 ENCOUNTER — Other Ambulatory Visit: Payer: Self-pay

## 2018-01-10 NOTE — Patient Outreach (Signed)
[facility]) Care Management  01/10/2018  Kron Everton [birth year] [id number]   Case closure:  No response from patient after 3 telephone calls and outreach letter attempt.  PLAN; RNCM will close patient due to being unable to reach. RNCM will send notification to patients primary MD of closure.   Quinn Plowman RN,BSN,CCM [facility] Telephonic  [phone]

## 2018-01-15 ENCOUNTER — Other Ambulatory Visit (HOSPITAL_COMMUNITY): Payer: Self-pay | Admitting: *Deleted

## 2018-01-15 ENCOUNTER — Encounter: Payer: PPO | Admitting: Neurology

## 2018-01-16 ENCOUNTER — Ambulatory Visit (HOSPITAL_COMMUNITY)
Admission: RE | Admit: 2018-01-16 | Discharge: 2018-01-16 | Disposition: A | Discharge status: Home / Self Care | Payer: PPO | Source: Ambulatory Visit | Attending: Nephrology | Admitting: Nephrology

## 2018-01-16 DIAGNOSIS — N189 Chronic kidney disease, unspecified: Secondary | ICD-10-CM | POA: Insufficient documentation

## 2018-01-16 DIAGNOSIS — D631 Anemia in chronic kidney disease: Secondary | ICD-10-CM | POA: Insufficient documentation

## 2018-01-16 MED ORDER — SODIUM CHLORIDE 0.9 % IV SOLN
510.0000 mg | INTRAVENOUS | Status: DC
  Administered 2018-01-16: 11:00:00 510 mg via INTRAVENOUS
  Filled 2018-01-16: qty 17

## 2018-01-21 ENCOUNTER — Other Ambulatory Visit: Payer: Self-pay | Admitting: Physician Assistant

## 2018-01-21 NOTE — Telephone Encounter (Signed)
Copied from [place] [id number]. Topic: Quick Communication - Rx Refill/Question >> Jan 21, 2018 10:04 AM Selinda Flavin B, NT wrote: Medication: furosemide (LASIX) 40 MG tablet  Has the patient contacted their pharmacy? Yes.   (Agent: If no, request that the patient contact the pharmacy for the refill.) (Agent: If yes, when and what did the pharmacy advise?)  Preferred Pharmacy (with phone number or street name): [facility] [place], [place]  Agent: Please be advised that RX refills may take up to 3 business days. We ask that you follow-up with your pharmacy.

## 2018-01-21 NOTE — Telephone Encounter (Signed)
Furosemide refill Last OV:12/18/17 Last refill:12/24/17 by historical provider ZHQ:[id number] Pharmacy: [facility] [place], [place] [phone] (Phone) [phone] (Fax)

## 2018-01-23 ENCOUNTER — Ambulatory Visit (HOSPITAL_COMMUNITY)
Admission: RE | Admit: 2018-01-23 | Discharge: 2018-01-23 | Disposition: A | Discharge status: Home / Self Care | Payer: PPO | Source: Ambulatory Visit | Attending: Nephrology | Admitting: Nephrology

## 2018-01-23 DIAGNOSIS — D631 Anemia in chronic kidney disease: Secondary | ICD-10-CM | POA: Diagnosis not present

## 2018-01-23 DIAGNOSIS — N189 Chronic kidney disease, unspecified: Secondary | ICD-10-CM | POA: Diagnosis present

## 2018-01-23 MED ORDER — SODIUM CHLORIDE 0.9 % IV SOLN
510.0000 mg | INTRAVENOUS | Status: AC
  Administered 2018-01-23: 11:00:00 510 mg via INTRAVENOUS
  Filled 2018-01-23: qty 17

## 2018-01-24 MED ORDER — FUROSEMIDE 40 MG PO TABS: 40.0000 mg | ORAL_TABLET | ORAL | 2 refills | Status: DC

## 2018-01-29 ENCOUNTER — Telehealth: Payer: Self-pay | Admitting: Physician Assistant

## 2018-01-29 NOTE — Telephone Encounter (Unsigned)
Copied from [place] [id number]. Topic: Quick Communication - See Telephone Encounter >> Jan 29, 2018  8:26 AM Hewitt Shorts wrote: Pt is needing to talk with [facility] regarding his sugar levels his reading have been fasting readings 130, 250, 160 and wants to know what to do   [place]    Best number [phone]

## 2018-01-30 NOTE — Telephone Encounter (Signed)
Copied from [place] [id number]. Topic: General - Other >> Jan 29, 2018  2:03 PM Yvette Rack wrote: Reason for CRM: Laurena Bering with [facility] called in requesting that the Rx for furosemide (LASIX) 40 MG tablet be a 90 day supply. Marita Kansas also asked about the frequency of the medication. Marita Kansas is requesting a return call at [phone]. Marita Kansas stated it is ok to leave a detailed message if she is unable to answer the call.  Sent in order for 90 day supply - Sig: 1 tab (40 mg) daily  LMOVM with Marita Kansas @ [facility] with above instructions  Forwarded to [employer] to confirm Sig.   If different, we need to re-order and call [facility] and advise

## 2018-02-01 ENCOUNTER — Telehealth: Payer: Self-pay | Admitting: Physician Assistant

## 2018-02-01 NOTE — Telephone Encounter (Addendum)
Pt.'s daughter Called to inform the practice that the script for [facility]urosemide [facility] did not have a dosage amount according to the pharmacist.   Requesting the script be resent as soon as possible as pt. Is out of medication

## 2018-02-01 NOTE — Telephone Encounter (Signed)
See phone note dated 01/29/18.

## 2018-02-01 NOTE — Telephone Encounter (Signed)
Can see that patient was prescribed 40mg  daily - but need to check if 90 day supply ok. Provider, please advise regarding 90 day supply.

## 2018-02-01 NOTE — Telephone Encounter (Signed)
[name] Elliott      Telephone Encounter  Addendum  Creation Time:  02/01/2018 3:38 PM            Pt.'s daughter Hulen Skains to inform the practice that [facility] did not have a dosage amount according to the pharmacist.   Requesting the script be resent as soon as possible as pt. Is out of medication

## 2018-02-04 ENCOUNTER — Other Ambulatory Visit: Payer: Self-pay

## 2018-02-04 MED ORDER — FUROSEMIDE 40 MG PO TABS: 40.0000 mg | ORAL_TABLET | ORAL | 0 refills | Status: DC

## 2018-02-04 NOTE — Telephone Encounter (Signed)
90 day supply OK. Please advise pharmacist. Thank you

## 2018-02-04 NOTE — Telephone Encounter (Signed)
90day supply sent.

## 2018-02-05 ENCOUNTER — Telehealth: Payer: Self-pay | Admitting: Physician Assistant

## 2018-02-05 NOTE — Telephone Encounter (Signed)
Copied from [place] [id number]. Topic: Quick Communication - See Telephone Encounter >> Feb 05, 2018 10:00 AM Ahmed Prima L wrote: CRM for notification. See Telephone encounter for: 02/05/18.   [facility] called and needs a frequency on the directions for furosemide (LASIX) 40 MG tablet. Please call back @ [phone]. Merrilee Seashore ), has secured voicemail

## 2018-02-05 NOTE — Telephone Encounter (Signed)
Advised [facility] that Furosemide should be 1 tablet by mouth daily. Please advise if this should be different.

## 2018-02-07 ENCOUNTER — Encounter: Payer: Self-pay | Admitting: Physician Assistant

## 2018-02-07 ENCOUNTER — Other Ambulatory Visit: Payer: Self-pay

## 2018-02-07 ENCOUNTER — Ambulatory Visit (INDEPENDENT_AMBULATORY_CARE_PROVIDER_SITE_OTHER): Payer: PPO | Admitting: Physician Assistant

## 2018-02-07 VITALS — BP 112/72 | HR 76 | Temp 98.0°F | Resp 16 | Wt 166.0 lb

## 2018-02-07 DIAGNOSIS — E079 Disorder of thyroid, unspecified: Secondary | ICD-10-CM

## 2018-02-07 DIAGNOSIS — W57XXXA Bitten or stung by nonvenomous insect and other nonvenomous arthropods, initial encounter: Secondary | ICD-10-CM

## 2018-02-07 DIAGNOSIS — D509 Iron deficiency anemia, unspecified: Secondary | ICD-10-CM

## 2018-02-07 DIAGNOSIS — Z8639 Personal history of other endocrine, nutritional and metabolic disease: Secondary | ICD-10-CM

## 2018-02-07 DIAGNOSIS — K219 Gastro-esophageal reflux disease without esophagitis: Secondary | ICD-10-CM

## 2018-02-07 DIAGNOSIS — Z1329 Encounter for screening for other suspected endocrine disorder: Secondary | ICD-10-CM

## 2018-02-07 DIAGNOSIS — R600 Localized edema: Secondary | ICD-10-CM | POA: Diagnosis not present

## 2018-02-07 MED ORDER — LEVOTHYROXINE SODIUM 50 MCG PO TABS: 50.0000 ug | ORAL_TABLET | Freq: Every day | ORAL | 3 refills | Status: DC

## 2018-02-07 MED ORDER — POTASSIUM CHLORIDE CRYS ER 20 MEQ PO TBCR: 20.0000 meq | EXTENDED_RELEASE_TABLET | Freq: Two times a day (BID) | ORAL | 3 refills | Status: DC

## 2018-02-07 MED ORDER — FUROSEMIDE 40 MG PO TABS: 40.0000 mg | ORAL_TABLET | Freq: Every day | ORAL | 3 refills | Status: DC

## 2018-02-07 MED ORDER — OMEPRAZOLE 20 MG PO CPDR
20.0000 mg | DELAYED_RELEASE_CAPSULE | Freq: Two times a day (BID) | ORAL | 3 refills | Status: DC
Start: 2018-02-07 — End: 2018-06-30

## 2018-02-07 NOTE — Progress Notes (Signed)
[name] Elliott  MRN: [id number] DOB: 07-11-1928  PCP: Dorise Hiss, PA-C  Subjective:  Pt is an 84 year old male who presents to clinic for several complaints. heis here today with his daughter.  He pulled off "about 20" ticks yesterday after mowing the lawn and thinks there may be more.   He continues to "just feel bad" He has appt with nephrologist soon.  Needs medication refills. Denies lightheadedness, dizziness, chronic headache, double vision, chest pain, shortness of breath, heart racing, palpitations, nausea, vomiting, abdominal pain, hematuria, lower leg swelling.   Review of Systems  Constitutional: Negative for chills and fever.  Musculoskeletal: Negative for arthralgias and joint swelling.  Skin: Negative.     Patient Active Problem List   Diagnosis Date Noted  . Memory loss 12/05/2017  . Orthostatic dizziness 12/05/2017  . Idiopathic peripheral neuropathy 12/05/2017  . PAF (paroxysmal atrial fibrillation) ([place]) 04/30/2017  . Shortness of breath 06/04/2016  . Chronic systolic CHF (congestive heart failure) ([place]) 06/04/2016  . Bilateral leg edema 12/12/2015  . Dizziness 09/27/2015  . Primary localized osteoarthritis of left knee 07/12/2015  . Weakness 02/23/2015  . DVT (deep venous thrombosis) ([place]) 12/14/2014  . Combined congestive systolic and diastolic heart failure ([place]) 12/14/2014  . Cerebral infarction due to unspecified mechanism   . TIA (transient ischemic attack) 08/01/2014  . Varicose veins of both legs with edema 05/07/2014  . Bilateral leg pain 04/16/2014  . Chronic kidney disease, stage III (moderate) ([place]) 12/21/2013  . Vascular dementia without behavioral disturbance   . Type 2 diabetes mellitus, controlled, with renal complications ([place]) 79/11/8331  . Diastolic dysfunction 83/29/1916  . Knee pain, bilateral 03/01/2013  . Hereditary and idiopathic peripheral neuropathy 11/26/2012  . Osteoporosis, unspecified 11/26/2012  . GI bleed  due to NSAIDs, DDX=Isch colitis, infectious colitis 11/19/2012  . Cough 05/29/2012  . 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 CONSTIPATION, CHRONIC 08/02/2010  . INSOMNIA, CHRONIC 03/08/2010  . Unspecified hypothyroidism 01/04/2010  . VITAMIN B12 DEFICIENCY 01/04/2010  . HYPERCHOLESTEROLEMIA 08/12/2009  . Essential hypertension 08/12/2009  . Coronary artery disease involving native coronary artery of native heart without angina pectoris 08/12/2009  . GERD 08/12/2009    Current Outpatient Medications on File Prior to Visit  Medication Sig Dispense Refill  . aspirin EC 81 MG tablet Take 81 mg by mouth daily.     Marland Kitchen bismuth subsalicylate (PEPTO BISMOL) 262 MG/15ML suspension Take 30 mLs by mouth as directed.    . furosemide (LASIX) 40 MG tablet Take 1 tablet (40 mg total) by mouth as directed. 90 tablet 0  . gabapentin (NEURONTIN) 300 MG capsule Take 2 capsules (600 mg total) by mouth 3 (three) times daily. 540 capsule 1  . lansoprazole (PREVACID) 30 MG capsule Take 2 capsules (60 mg total) by mouth daily at 12 noon. 2 capsule 0  . levothyroxine (SYNTHROID, LEVOTHROID) 50 MCG tablet Take 1 tablet (50 mcg total) by mouth daily. 90 tablet 3  . magnesium hydroxide (MILK OF MAGNESIA) 400 MG/5ML suspension Take 15 mLs by mouth daily as needed for mild constipation or moderate constipation.    Marland Kitchen omeprazole (PRILOSEC) 20 MG capsule Take 1 capsule (20 mg total) by mouth 2 (two) times daily before a meal. 180 capsule 3  . potassium chloride SA (K-DUR,KLOR-CON) 20 MEQ tablet Take 20 mEq by mouth 2 (two) times daily.     No current facility-administered medications on file prior to visit.     Allergies  Allergen Reactions  .  Crestor [Rosuvastatin Calcium] Other (See Comments)    Muscle pain/aches  . Mestinon [Pyridostigmine Bromide Er] Other (See Comments)    GI upset, stomach cramps     Objective:  BP 112/72   Pulse 76   Temp 98 F (36.7 C) (Oral)   Resp 16   Wt 166 lb  (75.3 kg)   SpO2 95%   BMI 31.37 kg/m   Physical Exam  Constitutional: He is oriented to person, place, and time. He appears well-developed and well-nourished.  Musculoskeletal:       Right lower leg: He exhibits edema.       Left lower leg: He exhibits edema.  Neurological: He is alert and oriented to person, place, and time.  Skin: Skin is warm and dry.  3 ticks removed from the groin area  Psychiatric: He has a normal mood and affect. His behavior is normal. Judgment and thought content normal.  Vitals reviewed.   Assessment and Plan :  1. Tick bite with subsequent removal of tick -Patient here for removal of ticks after encounter yesterday.  3 were removed from his groin area, mouthparts included.  2. Screening for endocrine disorder - Hemoglobin A1c - CMP14+EGFR  3. Bilateral leg edema - furosemide (LASIX) 40 MG tablet; Take 1 tablet (40 mg total) by mouth daily.  Dispense: 90 tablet; Refill: 3  4. Iron deficiency anemia, unspecified iron deficiency anemia type - CBC with Differential/Platelet  5. Gastroesophageal reflux disease without esophagitis - omeprazole (PRILOSEC) 20 MG capsule; Take 1 capsule (20 mg total) by mouth 2 (two) times daily before a meal.  Dispense: 180 capsule; Refill: 3  6. Thyroid disease - levothyroxine (SYNTHROID, LEVOTHROID) 50 MCG tablet; Take 1 tablet (50 mcg total) by mouth daily.  Dispense: 90 tablet; Refill: 3  7. History of low potassium    Mercer Pod, PA-C  Primary [facility] at [facility] 02/07/2018 5:57 PM

## 2018-02-07 NOTE — Patient Instructions (Addendum)
We are screening you for diabetes today. I will contact you with the results.   Come back and see me in 2 months.    Thank you for coming in today. I hope you feel we met your needs.  Feel free to call PCP if you have any questions or further requests.  Please consider signing up for MyChart if you do not already have it, as this is a great way to communicate with me.  Best,  Whitney McVey, PA-C  IF you received an x-ray today, you will receive an invoice from [facility]. Please contact [facility] at [phone] with questions or concerns regarding your invoice.   IF you received labwork today, you will receive an invoice from [place]. Please contact [facility] at [phone] with questions or concerns regarding your invoice.   Our billing staff will not be able to assist you with questions regarding bills from these companies.  You will be contacted with the lab results as soon as they are available. The fastest way to get your results is to activate your My Chart account. Instructions are located on the last page of this paperwork. If you have not heard from [place] regarding the results in 2 weeks, please contact this office.

## 2018-02-08 LAB — CMP14+EGFR
ALT: 11 IU/L (ref 0–44)
AST: 19 IU/L (ref 0–40)
Albumin/Globulin Ratio: 1.5 (ref 1.2–2.2)
Albumin: 4.4 g/dL (ref 3.5–4.7)
Alkaline Phosphatase: 69 IU/L (ref 39–117)
BUN/Creatinine Ratio: 18 (ref 10–24)
BUN: 26 mg/dL (ref 8–27)
Bilirubin Total: 0.4 mg/dL (ref 0.0–1.2)
CO2: 19 mmol/L — ABNORMAL LOW (ref 20–29)
Calcium: 9.4 mg/dL (ref 8.6–10.2)
Chloride: 106 mmol/L (ref 96–106)
Creatinine, Ser: 1.44 mg/dL — ABNORMAL HIGH (ref 0.76–1.27)
GFR calc Af Amer: 50 mL/min/{1.73_m2} — ABNORMAL LOW (ref >59)
GFR calc non Af Amer: 43 mL/min/{1.73_m2} — ABNORMAL LOW (ref >59)
Globulin, Total: 3 g/dL (ref 1.5–4.5)
Glucose: 120 mg/dL — ABNORMAL HIGH (ref 65–99)
Potassium: 4.3 mmol/L (ref 3.5–5.2)
Sodium: 141 mmol/L (ref 134–144)
Total Protein: 7.4 g/dL (ref 6.0–8.5)

## 2018-02-08 LAB — CBC WITH DIFFERENTIAL/PLATELET
Basophils Absolute: 0 10*3/uL (ref 0.0–0.2)
Basos: 0 %
EOS (ABSOLUTE): 0.3 10*3/uL (ref 0.0–0.4)
Eos: 4 %
Hematocrit: 38.4 % (ref 37.5–51.0)
Hemoglobin: 12.2 g/dL — ABNORMAL LOW (ref 13.0–17.7)
Immature Grans (Abs): 0 10*3/uL (ref 0.0–0.1)
Immature Granulocytes: 0 %
Lymphocytes Absolute: 1.5 10*3/uL (ref 0.7–3.1)
Lymphs: 22 %
MCH: 28.6 pg (ref 26.6–33.0)
MCHC: 31.8 g/dL (ref 31.5–35.7)
MCV: 90 fL (ref 79–97)
Monocytes Absolute: 0.5 10*3/uL (ref 0.1–0.9)
Monocytes: 8 %
Neutrophils Absolute: 4.5 10*3/uL (ref 1.4–7.0)
Neutrophils: 66 %
Platelets: 258 10*3/uL (ref 150–450)
RBC: 4.26 x10E6/uL (ref 4.14–5.80)
RDW: 18.2 % — ABNORMAL HIGH (ref 12.3–15.4)
WBC: 6.8 10*3/uL (ref 3.4–10.8)

## 2018-02-08 LAB — SPECIMEN STATUS REPORT

## 2018-02-08 LAB — HEMOGLOBIN A1C
Est. average glucose Bld gHb Est-mCnc: 123 mg/dL
Hgb A1c MFr Bld: 5.9 % — ABNORMAL HIGH (ref 4.8–5.6)

## 2018-02-11 ENCOUNTER — Telehealth: Payer: Self-pay | Admitting: Physician Assistant

## 2018-02-11 NOTE — Telephone Encounter (Signed)
Copied from [place] [id number]. Topic: Quick Communication - See Telephone Encounter >> Feb 11, 2018 12:28 PM Bea Graff, NT wrote: CRM for notification. See Telephone encounter for: 02/11/18. Marcello Moores with [facility] is calling regarding the rx for potassium chloride SA (K-DUR,KLOR-CON) 20 MEQ tablet. He needs verbal ok to change rx to 180 tablets for a 90 day rx for this pt. CB#: [phone]. May leave detailed voicemail but please include first and last name in full.

## 2018-02-11 NOTE — Telephone Encounter (Signed)
Verbal given on VM.  

## 2018-05-01 ENCOUNTER — Other Ambulatory Visit: Payer: Self-pay

## 2018-05-01 ENCOUNTER — Encounter: Payer: Self-pay | Admitting: Physician Assistant

## 2018-05-01 ENCOUNTER — Ambulatory Visit (INDEPENDENT_AMBULATORY_CARE_PROVIDER_SITE_OTHER): Payer: PPO | Admitting: Physician Assistant

## 2018-05-01 VITALS — BP 120/62 | HR 69 | Temp 98.0°F | Resp 16 | Ht 60.5 in | Wt 166.8 lb

## 2018-05-01 DIAGNOSIS — R21 Rash and other nonspecific skin eruption: Secondary | ICD-10-CM

## 2018-05-01 MED ORDER — MUPIROCIN 2 % EX OINT: 1.0000 "application " | TOPICAL_OINTMENT | Freq: Three times a day (TID) | CUTANEOUS | 1 refills | Status: DC

## 2018-05-01 NOTE — Patient Instructions (Addendum)
I am treating you today for folliculitis:  Start washing with antibacterial soap Apply Mupirocin three times/day for 5 days.  Avoid scratching the areas or excessive heat.   How is this treated? This condition may be treated by:  Applying warm compresses to the affected areas.  Taking an antibiotic medicine or applying an antibiotic medicine to the skin.  Applying or bathing with an antiseptic solution.  Taking an over-the-counter medicine to help with itching.  Follow these instructions at home:  If directed, apply heat to the affected area as often as told by your health care provider. Use the heat source that your health care provider recommends, such as a moist heat pack or a heating pad. ? Place a towel between your skin and the heat source. ? Leave the heat on for 20-30 minutes. ? Remove the heat if your skin turns bright red. This is especially important if you are unable to feel pain, heat, or cold. You may have a greater risk of getting burned.  If you were prescribed an antibiotic medicine, use it as told by your health care provider. Do not stop using the antibiotic even if you start to feel better.  Do not shave irritated skin.  Keep all follow-up visits as told by your health care provider. This is important.  Come back in 1 week if you are not improving  Folliculitis Folliculitis is inflammation of the hair follicles. Folliculitis most commonly occurs on the scalp, thighs, legs, back, and buttocks. However, it can occur anywhere on the body. What are the causes? This condition may be caused by:  A bacterial infection (common).  A fungal infection.  A viral infection.  Coming into contact with certain chemicals, especially oils and tars.  Shaving or waxing.  Applying greasy ointments or creams to your skin often.  Long-lasting folliculitis and folliculitis that keeps coming back can be caused by bacteria that live in the nostrils. What increases the  risk? This condition is more likely to develop in people with:  A weakened immune system.  Diabetes.  Obesity.  What are the signs or symptoms? Symptoms of this condition include:  Redness.  Soreness.  Swelling.  Itching.  Small white or yellow, pus-filled, itchy spots (pustules) that appear over a reddened area. If there is an infection that goes deep into the follicle, these may develop into a boil (furuncle).  A group of closely packed boils (carbuncle). These tend to form in hairy, sweaty areas of the body.  How is this diagnosed? This condition is diagnosed with a skin exam. To find what is causing the condition, your health care provider may take a sample of one of the pustules or boils for testing. Get help right away if:  You have more redness, swelling, or pain in the affected area.  Red streaks are spreading from the affected area.  You have a fever. This information is not intended to replace advice given to you by your health care provider. Make sure you discuss any questions you have with your health care provider. Document Released: 10/29/2001 Document Revised: 03/09/2016 Document Reviewed: 06/10/2015 Elsevier Interactive Patient Education  2018 [employer].   IF you received an x-ray today, you will receive an invoice from [facility]. Please contact [facility] at [phone] with questions or concerns regarding your invoice.   IF you received labwork today, you will receive an invoice from [place]. Please contact [facility] at [phone] with questions or concerns regarding your invoice.   Our  billing staff will not be able to assist you with questions regarding bills from these companies.  You will be contacted with the lab results as soon as they are available. The fastest way to get your results is to activate your My Chart account. Instructions are located on the last page of this paperwork. If you have not heard from [place] regarding  the results in 2 weeks, please contact this office.

## 2018-05-01 NOTE — Progress Notes (Signed)
[name] Elliott  MRN: [id number] DOB: 1927-12-13  PCP: Dorise Hiss, PA-C  Subjective:  Pt presents to clinic for rash x 1 week. C/o small bumps which started on his groin area. Bumps are now on his upper chest, back and under his arms. He has tried otc steroid cream, not helping. Endorses itching.   Review of Systems  Constitutional: Negative for chills and fever.  Skin: Positive for rash.    Patient Active Problem List   Diagnosis Date Noted  . Memory loss 12/05/2017  . Orthostatic dizziness 12/05/2017  . Idiopathic peripheral neuropathy 12/05/2017  . PAF (paroxysmal atrial fibrillation) ([place]) 04/30/2017  . Shortness of breath 06/04/2016  . Chronic systolic CHF (congestive heart failure) ([place]) 06/04/2016  . Bilateral leg edema 12/12/2015  . Dizziness 09/27/2015  . Primary localized osteoarthritis of left knee 07/12/2015  . Weakness 02/23/2015  . DVT (deep venous thrombosis) ([place]) 12/14/2014  . Combined congestive systolic and diastolic heart failure ([place]) 12/14/2014  . Cerebral infarction due to unspecified mechanism   . TIA (transient ischemic attack) 08/01/2014  . Varicose veins of both legs with edema 05/07/2014  . Bilateral leg pain 04/16/2014  . Chronic kidney disease, stage III (moderate) ([place]) 12/21/2013  . Vascular dementia without behavioral disturbance   . Type 2 diabetes mellitus, controlled, with renal complications ([place]) 92/07/9416  . Diastolic dysfunction 40/81/4481  . Knee pain, bilateral 03/01/2013  . Hereditary and idiopathic peripheral neuropathy 11/26/2012  . Osteoporosis, unspecified 11/26/2012  . GI bleed due to NSAIDs, DDX=Isch colitis, infectious colitis 11/19/2012  . Cough 05/29/2012  . 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 CONSTIPATION, CHRONIC 08/02/2010  . INSOMNIA, CHRONIC 03/08/2010  . Unspecified hypothyroidism 01/04/2010  . VITAMIN B12 DEFICIENCY 01/04/2010  . HYPERCHOLESTEROLEMIA 08/12/2009  . Essential  hypertension 08/12/2009  . Coronary artery disease involving native coronary artery of native heart without angina pectoris 08/12/2009  . GERD 08/12/2009    Current Outpatient Medications on File Prior to Visit  Medication Sig Dispense Refill  . aspirin EC 81 MG tablet Take 81 mg by mouth daily.     Marland Kitchen bismuth subsalicylate (PEPTO BISMOL) 262 MG/15ML suspension Take 30 mLs by mouth as directed.    . furosemide (LASIX) 40 MG tablet Take 1 tablet (40 mg total) by mouth daily. 90 tablet 3  . gabapentin (NEURONTIN) 300 MG capsule Take 2 capsules (600 mg total) by mouth 3 (three) times daily. 540 capsule 1  . lansoprazole (PREVACID) 30 MG capsule Take 2 capsules (60 mg total) by mouth daily at 12 noon. 2 capsule 0  . levothyroxine (SYNTHROID, LEVOTHROID) 50 MCG tablet Take 1 tablet (50 mcg total) by mouth daily. 90 tablet 3  . magnesium hydroxide (MILK OF MAGNESIA) 400 MG/5ML suspension Take 15 mLs by mouth daily as needed for mild constipation or moderate constipation.    Marland Kitchen omeprazole (PRILOSEC) 20 MG capsule Take 1 capsule (20 mg total) by mouth 2 (two) times daily before a meal. 180 capsule 3  . potassium chloride SA (K-DUR,KLOR-CON) 20 MEQ tablet Take 1 tablet (20 mEq total) by mouth 2 (two) times daily. 90 tablet 3   No current facility-administered medications on file prior to visit.     Allergies  Allergen Reactions  . Crestor [Rosuvastatin Calcium] Other (See Comments)    Muscle pain/aches  . Mestinon [Pyridostigmine Bromide Er] Other (See Comments)    GI upset, stomach cramps     Objective:  BP 120/62 (BP Location: Left Arm, Patient Position:  Sitting, Cuff Size: Normal)   Pulse 69   Temp 98 F (36.7 C) (Oral)   Resp 16   Ht 5' 0.5" (1.537 m) Comment: with shoes  Wt 166 lb 12.8 oz (75.7 kg)   SpO2 96%   BMI 32.04 kg/m   Physical Exam  Constitutional: He is oriented to person, place, and time. He appears well-developed and well-nourished.  Neurological: He is alert and  oriented to person, place, and time.  Skin: Skin is warm and dry. Rash noted. Rash is papular and pustular (mild).  Psychiatric: He has a normal mood and affect. His behavior is normal. Judgment and thought content normal.  Vitals reviewed.   Assessment and Plan :  1. Rash and nonspecific skin eruption - Pt presents with rash consistent with bacterial folliculitis. Plan to treat topically with bactroban. Wound culture is pending. RTC if no improvement.   - WOUND CULTURE - mupirocin ointment (BACTROBAN) 2 %; Apply 1 application topically 3 (three) times daily.  Dispense: 22 g; Refill: 1    Whitney Gertude Benito, PA-C  Primary Care at [place] 05/01/2018 4:14 PM  Please note: Portions of this report may have been transcribed using dragon voice recognition software. Every effort was made to ensure accuracy; however, inadvertent computerized transcription errors may be present.

## 2018-05-03 LAB — WOUND CULTURE: Organism ID, Bacteria: NONE SEEN

## 2018-05-09 ENCOUNTER — Other Ambulatory Visit: Payer: Self-pay | Admitting: Physician Assistant

## 2018-05-09 DIAGNOSIS — Z8639 Personal history of other endocrine, nutritional and metabolic disease: Secondary | ICD-10-CM

## 2018-05-09 DIAGNOSIS — G609 Hereditary and idiopathic neuropathy, unspecified: Secondary | ICD-10-CM

## 2018-05-09 NOTE — Telephone Encounter (Signed)
gabepentin 300mg  refill Last Refill;12/25/17 # 540 Last OV:  Unable to assess PCP: McVey [facility]       Potassium 20 meq refill Last Refill:02/07/18 # 90 Last OV: Unable to assess PCP: Mc vey [facility]  Unable to asses when last OV related to these meds.

## 2018-06-09 ENCOUNTER — Other Ambulatory Visit: Payer: Self-pay | Admitting: Physician Assistant

## 2018-06-09 DIAGNOSIS — Z8639 Personal history of other endocrine, nutritional and metabolic disease: Secondary | ICD-10-CM

## 2018-06-09 NOTE — Telephone Encounter (Signed)
Copied from [place] [id number]. Topic: Quick Communication - Rx Refill/Question >> Jun 09, 2018  3:19 PM Yvette Rack wrote: Medication: potassium chloride SA (K-DUR,KLOR-CON) 20 MEQ tablet  Has the patient contacted their pharmacy? No. Never sent medicine there (Agent: If no, request that the patient contact the pharmacy for the refill.) (Agent: If yes, when and what did the pharmacy advise?)  Preferred Pharmacy (with phone number or street name): [id number] [place], [place] [phone] (Phone) [phone] (Fax)    Agent: Please be advised that RX refills may take up to 3 business days. We ask that you follow-up with your pharmacy.

## 2018-06-10 MED ORDER — POTASSIUM CHLORIDE CRYS ER 20 MEQ PO TBCR: 20.0000 meq | EXTENDED_RELEASE_TABLET | Freq: Two times a day (BID) | ORAL | 0 refills | Status: DC

## 2018-06-10 NOTE — Telephone Encounter (Signed)
Requested Prescriptions  Pending Prescriptions Disp Refills  . potassium chloride SA (K-DUR,KLOR-CON) 20 MEQ tablet 180 tablet 0    Sig: Take 1 tablet (20 mEq total) by mouth 2 (two) times daily.     Endocrinology:  Minerals - Potassium Supplementation Failed - 06/09/2018  3:38 PM      Failed - Cr in normal range and within 360 days    Creat  Date Value Ref Range Status  06/04/2016 1.50 (H) 0.70 - 1.11 mg/dL Final    Comment:      For patients > or = 82 years of age: The upper reference limit for Creatinine is approximately 13% higher for people identified as African-American.      Creatinine, Ser  Date Value Ref Range Status  02/07/2018 1.44 (H) 0.76 - 1.27 mg/dL Final         Passed - K in normal range and within 360 days    Potassium  Date Value Ref Range Status  02/07/2018 4.3 3.5 - 5.2 mmol/L Final         Passed - Valid encounter within last 12 months    Recent Outpatient Visits          1 month ago Rash and nonspecific skin eruption   Primary Care at [facility], [place], PA-C   4 months ago Tick bite with subsequent removal of tick   Primary Care at [facility], [place], PA-C   5 months ago History of unexplained bleeding   Primary Care at [facility], [place], PA-C   7 months ago Gastroesophageal reflux disease without esophagitis   Primary Care at [facility], [place], PA-C   9 months ago Rib pain on right side   Primary Care at [facility], Arlie Solomons, MD           Pt requesting med to be filled at new pharmacy

## 2018-06-12 DIAGNOSIS — N183 Chronic kidney disease, stage 3 (moderate): Secondary | ICD-10-CM | POA: Diagnosis not present

## 2018-06-19 DIAGNOSIS — N61 Mastitis without abscess: Secondary | ICD-10-CM | POA: Diagnosis not present

## 2018-06-19 DIAGNOSIS — N2581 Secondary hyperparathyroidism of renal origin: Secondary | ICD-10-CM | POA: Diagnosis not present

## 2018-06-19 DIAGNOSIS — N183 Chronic kidney disease, stage 3 (moderate): Secondary | ICD-10-CM | POA: Diagnosis not present

## 2018-06-19 DIAGNOSIS — D631 Anemia in chronic kidney disease: Secondary | ICD-10-CM | POA: Diagnosis not present

## 2018-06-19 DIAGNOSIS — I129 Hypertensive chronic kidney disease with stage 1 through stage 4 chronic kidney disease, or unspecified chronic kidney disease: Secondary | ICD-10-CM | POA: Diagnosis not present

## 2018-06-30 ENCOUNTER — Telehealth: Payer: Self-pay | Admitting: Physician Assistant

## 2018-06-30 DIAGNOSIS — K219 Gastro-esophageal reflux disease without esophagitis: Secondary | ICD-10-CM

## 2018-06-30 DIAGNOSIS — G609 Hereditary and idiopathic neuropathy, unspecified: Secondary | ICD-10-CM

## 2018-06-30 NOTE — Telephone Encounter (Signed)
Copied from [place] #[id number]. Topic: Quick Communication - Rx Refill/Question >> Jun 30, 2018 10:49 AM Nils Flack wrote: Medication: gabapentin (NEURONTIN) 300 MG capsule and omeprazole (PRILOSEC) 20 MG capsule  Has the patient contacted their pharmacy? No. (Agent: If no, request that the patient contact the pharmacy for the refill.) (Agent: If yes, when and what did the pharmacy advise?) switch pharmacy   Preferred Pharm[facility] (with phone number [facility] [place]  Agent: Please be advised that RX refills may take up to 3 business days. We ask that you follow-up with your pharmacy.

## 2018-07-01 MED ORDER — OMEPRAZOLE 20 MG PO CPDR: 20.0000 mg | DELAYED_RELEASE_CAPSULE | Freq: Two times a day (BID) | ORAL | 3 refills | Status: AC

## 2018-07-01 NOTE — Telephone Encounter (Signed)
Requested Prescriptions  Pending Prescriptions Disp Refills  . gabapentin (NEURONTIN) 300 MG capsule 540 capsule 0     Neurology: Anticonvulsants - gabapentin Passed - 06/30/2018  2:25 PM      Passed - Valid encounter within last 12 months    Recent Outpatient Visits          2 months ago Rash and nonspecific skin eruption   Primary Care at [facility], [place], PA-C   4 months ago Tick bite with subsequent removal of tick   Primary Care at [facility], [place] Mink, PA-C   6 months ago History of unexplained bleeding   Primary Care at [facility], [place] Mink, PA-C   8 months ago Gastroesophageal reflux disease without esophagitis   Primary Care at [facility], [place], PA-C   10 months ago Rib pain on right side   Primary Care at [facility], [place]oe A, MD           . omeprazole (PRILOSEC) 20 MG capsule 180 capsule 3    Sig: Take 1 capsule (20 mg total) by mouth 2 (two) times daily before a meal.     Gastroenterology: Proton Pump Inhibitors Passed - 06/30/2018  2:25 PM      Passed - Valid encounter within last 12 months    Recent Outpatient Visits          2 months ago Rash and nonspecific skin eruption   Primary Care at [facility], [place], PA-C   4 months ago Tick bite with subsequent removal of tick   Primary Care at [facility], [place], PA-C   6 months ago History of unexplained bleeding   Primary Care at [facility], [place], PA-C   8 months ago Gastroesophageal reflux disease without esophagitis   Primary Care at [facility], [place], PA-C   10 months ago Rib pain on right side   Primary Care at [facility], Arlie Solomons, MD

## 2018-07-01 NOTE — Telephone Encounter (Signed)
Requested medication (s) are due for refill today: ?  Requested medication (s) are on the active medication list: yes  Last refill:  05/09/18  Future visit scheduled: no  Notes to clinic:  Please add instructions    Requested Prescriptions  Pending Prescriptions Disp Refills   gabapentin (NEURONTIN) 300 MG capsule 540 capsule 0     Neurology: Anticonvulsants - gabapentin Passed - 06/30/2018  2:25 PM      Passed - Valid encounter within last 12 months    Recent Outpatient Visits          2 months ago Rash and nonspecific skin eruption   Primary Care at [facility], [place], PA-C   4 months ago Tick bite with subsequent removal of tick   Primary Care at [facility], Gelene Mink, PA-C   6 months ago History of unexplained bleeding   Primary Care at [facility], Gelene Mink, PA-C   8 months ago Gastroesophageal reflux disease without esophagitis   Primary Care at [facility], [place], PA-C   10 months ago Rib pain on right side   Primary Care at [facility], Arlie Solomons, MD           Signed Prescriptions Disp Refills   omeprazole (PRILOSEC) 20 MG capsule 180 capsule 3    Sig: Take 1 capsule (20 mg total) by mouth 2 (two) times daily before a meal.     Gastroenterology: Proton Pump Inhibitors Passed - 06/30/2018  2:25 PM      Passed - Valid encounter within last 12 months    Recent Outpatient Visits          2 months ago Rash and nonspecific skin eruption   Primary Care at [facility], [place], PA-C   4 months ago Tick bite with subsequent removal of tick   Primary Care at [facility], [place], PA-C   6 months ago History of unexplained bleeding   Primary Care at [facility], [place], PA-C   8 months ago Gastroesophageal reflux disease without esophagitis   Primary Care at [facility], [place], PA-C   10 months ago Rib pain on right side   Primary Care at [facility], Arlie Solomons, MD

## 2018-07-03 ENCOUNTER — Encounter: Payer: Self-pay | Admitting: Cardiovascular Disease

## 2018-07-03 ENCOUNTER — Ambulatory Visit: Payer: PPO | Admitting: Cardiovascular Disease

## 2018-07-03 VITALS — BP 134/60 | HR 62 | Ht 60.5 in | Wt 167.8 lb

## 2018-07-03 DIAGNOSIS — I2510 Atherosclerotic heart disease of native coronary artery without angina pectoris: Secondary | ICD-10-CM | POA: Diagnosis not present

## 2018-07-03 DIAGNOSIS — R600 Localized edema: Secondary | ICD-10-CM | POA: Diagnosis not present

## 2018-07-03 DIAGNOSIS — I5042 Chronic combined systolic (congestive) and diastolic (congestive) heart failure: Secondary | ICD-10-CM

## 2018-07-03 MED ORDER — FUROSEMIDE 40 MG PO TABS: 40.0000 mg | ORAL_TABLET | Freq: Every day | ORAL | 3 refills | Status: DC

## 2018-07-03 MED ORDER — NITROGLYCERIN 0.4 MG SL SUBL: 0.4000 mg | SUBLINGUAL_TABLET | SUBLINGUAL | 6 refills | Status: AC | PRN

## 2018-07-03 NOTE — Telephone Encounter (Signed)
Patient's daughter [name] calling because the patient will be out of his gabapentin this week and has been taking as prescribed, but the medication refill was refused. Would like a call back to discuss.

## 2018-07-03 NOTE — Progress Notes (Signed)
Cardiology Office Note   Date:  07/03/2018   ID:  Dondi Aime, DOB 02-07-1928, MRN [id number]  PCP:  Dorise Hiss, PA-C  Cardiologist:   Mertie Moores, MD   Chief Complaint  Patient presents with  . Coronary Artery Disease   Problem list:  1. Moderate coronary artery disease 2. Dementia 3. Peripheral vascular disease 4. CVA 5.  Paroxysmal atrial fib      84 year old gentleman with a history of moderate diffuse coronary artery disease. We treated medically. The left anterior descending arteries/ 1st diagonal stenosis has not was not suitable for PCI.  He complains of being sick in general is weak sensation. He also complains of some left arm pain. When I saw him last month. He was having some episodes of chest pain. We were trying to decide whether or not these were due to angina. I asked him to take nitroglycerin when he had these episodes of pain. At times the nitroglycerin helps him and at other times the nitroglycerin did not do anything. When he walks up a hill in his backyard he has significant shortness breath and this "sick feeling".  He has had a partial shoulder replacement for a shoulder fracture. He has had chronic shoulder pain since that time.  He has chronic knee pain and is considering knee surgery. His office visit was for the purpose of preoperative evaluation.  Sept. 17, 2013- He has no cardiac complaints. He denies any chest pain or dyspnea. He still has some left shoulder stiffness from his surgery February 05, 2012. He has had some wheezing and was recently started on steroids and an antibiotic.  He also ws diagnosed with peripheral neurophy He was prescribed gabapentin but his daughter did not fill the medication because it was too expensive.  She bought some over-the-counter vitamin B12 which seems to be helping a little bit.  November 26, 2012:  Yash was recently hospitalized last week for peptic ulcer Disease. He had vomiting of  coffee-ground emesis. He also had blood in his stool. [facility] discontinued his Gabriel Earing powders and his Fosamax. He seems to be doing well from a cardiac standpoint. No angina.  He is having trouble with leg pain.   Jan. 20, 2015:  No CP. He has lots of fatigue especially if he tries to walk any distance.  April 21, 2014,   November 02, 2014;  Krue Peterka is a 84 y.o. male who presents for his coronary artery disease.   He was seen by Richardson Dopp several weeks ago. He had a stroke back in November. We've placed a event monitor looking for atrial fib . There was no evidence of atrial fibrillation on the monitor. There is some mention of atrial fibrillation in past charts but we've not been able to locate any confirmation of that.  He complains of leg pain today.   He keeps his legs elevated 3-4 hours a day.  Has seen [facility] and they had no solutions for his leg pain.    No additional stroke symptoms.  He was seen with his granddaughter this am.  Aug. 30, 2016:  He has been diagnosed with DVTs.  Has been on xarelto. Needs to have knee  Replacement .   Feb. 28, 2017: Still having lot of left leg swelling from his left TKA . No CP . Breathing is ok Unable to walk very far at all   Oct. 2, 2017:  Is having some shortness of breath  - especially with any  exertion  Last echo was 08/02/14 and showed LVEF of 45-50%. Grade 1 DD.  Mild - mod MR  Eats lots of salty foods according to family Has some leg swelling   Jan 01, 2017:  Dayshon is seen today for follow up visit atrial fib, cad, and chronic combined CHF  Has been having lots of dizziness and shortness of breath recently .   Was seen at [facility] He gets dizzy,  Has profound leg weakness.  At times he has orthostatic hypotension   Has headaches.   Reported some chest pain last week that worsened when he moved his arm    04/30/2017:  Stelios is seen today for follow-up visit. He does not have any cardiac complaints.  He complains of feeling very weak and not having any energy. His blood pressure has been low and he has had orthostatic hypotension in the past. He has already stopped taking isosorbide mononitrate.  December 24, 2017:  Seen with [name] ( daughter)  Mr. Detjen is seen back today for follow-up of his coronary artery disease.  He has a history of paroxysmal atrial fibrillation.  He is been on Xarelto 15 mg a day.  He recently was seen in the emergency room with a leg injury.   Had some leg swellnig - a vericose vein ruptured.  Hb dropped from 13 to 10 .   Also had a severe nose bleed in March   Is still eating a very high salt diet   Has seen [place] neuro for his orthostatic hypotension ( feels " drunk " ) Tried mestinon 60 mg TID but did not tolerate it .   Oct. 31, 2019: Sequan has had some chest pain Has some swelling and a mass behind his left nipple  The CP seems to be below the the mass.      Past Medical History:  Diagnosis Date  . Arthritis   . Arthropathy, unspecified, site unspecified   . Atrial fibrillation ([place])   . Blood dyscrasia    thromboctopenia pt family states he was never told of this  . CAD (coronary artery disease)    last cath in 2012. Managed medically-some blockages  . Chronic lower back pain   . Chronic systolic CHF (congestive heart failure) ([place]) 06/04/2016  . Constipation, chronic   . Diabetes mellitus without complication ([place])    pt states he doesn't have  . DVT (deep venous thrombosis) ([place])   . Dysrhythmia   . Esophageal reflux   . 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 MRI Nov '11 - tendonosis and partial tear. Left shoulder surgery Jan '12 for partial shoulder replacement. [name])   . Headache(784.0)   . History of stomach ulcers 11/2012  . Hypothyroidism    pt states he doeen't have   . Orthostasis   . Other B-complex deficiencies   . Other malaise and fatigue   . Pain in joint, shoulder region    Has  chronic shoulder pain  . Pain in limb   . Persistent disorder of initiating or maintaining sleep   . Pneumonia 1980's?; 2011  . Primary localized osteoarthritis of left knee 07/12/2015  . Pure hypercholesterolemia   . PVC's (premature ventricular contractions)   . S/P cardiac cath 08/08/11   mild to moderate CAD primarily in the LAD. None are obstructive and appear stable from prior cath in 2007; managed medically  . Sebaceous cyst   . Stroke ([place])   . Thrombocytopenia, unspecified ([place])   .  Unspecified essential hypertension   . Vascular dementia without behavioral disturbance (H[facility])    with periods of amnesia    Past Surgical History:  Procedure Laterality Date  . BACK SURGERY    . CARDIAC CATHETERIZATION  08/2011  . CATARACT EXTRACTION Left 2009  . COLONOSCOPY    . ESOPHAGOGASTRODUODENOSCOPY N/A 11/19/2012   Procedure: ESOPHAGOGASTRODUODENOSCOPY (EGD);  Surgeon: Jerene Bears, MD;  Location: [place];  Service: Gastroenterology;  Laterality: N/A;  . FINGER AMPUTATION Right    pinky finger  . HAND SURGERY Right    crush injury, right fifth digit contracture, limited  motion  . HARDWARE REMOVAL  02/05/2012   Procedure: HARDWARE REMOVAL;  Surgeon: Johnny Bridge, MD;  Location: [place];  Service: Orthopedics;  Laterality: Left;  . KNEE SURGERY Left 1991   "did it twice in 1 wk" (02/17/2013)  . LUMBAR SPINE SURGERY  2007   Dr  Aspen ([place])  . REVERSE SHOULDER ARTHROPLASTY  02/05/2012   Procedure: REVERSE SHOULDER ARTHROPLASTY;  Surgeon: Johnny Bridge, MD;  Location: [place];  Service: Orthopedics;  Laterality: Left;  . SHOULDER HEMI-ARTHROPLASTY    . SHOULDER OPEN ROTATOR CUFF REPAIR Left 8.30.2011  . TOTAL KNEE ARTHROPLASTY Left 07/12/2015   Procedure: TOTAL KNEE ARTHROPLASTY;  Surgeon: Marchia Bond, MD;  Location: [place];  Service: Orthopedics;  Laterality: Left;  . US ECHOCARDIOGRAPHY  02-28-2010   Est EF 50-55%     Current Outpatient Medications  Medication Sig  Dispense Refill  . aspirin EC 81 MG tablet Take 81 mg by mouth daily.     Marland Kitchen bismuth subsalicylate (PEPTO BISMOL) 262 MG/15ML suspension Take 30 mLs by mouth as directed.    . furosemide (LASIX) 40 MG tablet Take 1 tablet (40 mg total) by mouth daily. 90 tablet 3  . gabapentin (NEURONTIN) 300 MG capsule Take 2 capsules (600 mg) by mouth three times a day 540 capsule 0  . levothyroxine (SYNTHROID, LEVOTHROID) 50 MCG tablet Take 1 tablet (50 mcg total) by mouth daily. 90 tablet 3  . magnesium hydroxide (MILK OF MAGNESIA) 400 MG/5ML suspension Take 15 mLs by mouth daily as needed for mild constipation or moderate constipation.    . mupirocin ointment (BACTROBAN) 2 % Apply 1 application topically 3 (three) times daily. 22 g 1  . omeprazole (PRILOSEC) 20 MG capsule Take 1 capsule (20 mg total) by mouth 2 (two) times daily before a meal. 180 capsule 3  . potassium chloride SA (K-DUR,KLOR-CON) 20 MEQ tablet Take 1 tablet (20 mEq total) by mouth 2 (two) times daily. 180 tablet 0  . nitroGLYCERIN (NITROSTAT) 0.4 MG SL tablet Place 1 tablet (0.4 mg total) under the tongue every 5 (five) minutes as needed for chest pain. 25 tablet 6   No current facility-administered medications for this visit.     Allergies:   Crestor [rosuvastatin calcium] and Mestinon [pyridostigmine bromide er]    Social History:  The patient  reports that he quit smoking about 39 years ago. His smoking use included cigars. His smokeless tobacco use includes chew. He reports that he does not drink alcohol or use drugs.   Family History:  The patient's family history includes Breast cancer in his mother; Cancer in his brother and mother; Diabetes in his daughter, mother, and sister; Kidney disease in his father; Stroke in his brother.    ROS: Noted in current history, all other systems are negative.   Physical Exam: Blood pressure 134/60, pulse 62, height 5' 0.5" (1.537 m), weight  167 lb 12.8 oz (76.1 kg), SpO2 99 %.  GEN:  Well  nourished, well developed in no acute distress HEENT: Normal NECK: No JVD; No carotid bruits LYMPHATICS: No lymphadenopathy Breast:   Mild swelling of the left breast,  Mod  Tenderness No skin manifestations  CARDIAC: RR,   RESPIRATORY:  Clear to auscultation without rales, wheezing or rhonchi  ABDOMEN: Soft, non-tender, non-distended MUSCULOSKELETAL:  No edema; No deformity  SKIN: Warm and dry NEUROLOGIC:  Alert and oriented x 3   EKG:    Recent Labs: 10/29/2017: TSH 6.580 12/12/2017: B Natriuretic Peptide 93.3; Magnesium 2.9 02/07/2018: ALT 11; BUN 26; Creatinine, Ser 1.44; Hemoglobin 12.2; Platelets 258; Potassium 4.3; Sodium 141   Lipid Panel    Component Value Date/Time   CHOL 167 03/16/2017 1110   TRIG 79 03/16/2017 1110   HDL 62 03/16/2017 1110   CHOLHDL 2.7 03/16/2017 1110   CHOLHDL 2.5 06/04/2016 0857   VLDL 9 06/04/2016 0857   LDLCALC 89 03/16/2017 1110   LDLDIRECT 140.6 09/25/2013 1030      Wt Readings from Last 3 Encounters:  07/03/18 167 lb 12.8 oz (76.1 kg)  05/01/18 166 lb 12.8 oz (75.7 kg)  02/07/18 166 lb (75.3 kg)      Other studies Reviewed: Additional studies/ records that were reviewed today include:  Review of the above records demonstrates:    ASSESSMENT AND PLAN:  1.  - Generalized weakness:   He is seeing neurology. No changes   2. Moderate coronary artery disease -   he is had some episodes of chest discomfort.  It is unclear if these are angina.  Given his age, chronic kidney disease, and his overall frail state I do not think that he is candidate for heart catheterization and intervention.      3. Peripheral vascular disease- .   4. Paroxysmal atrial fib:   .no recurrenc   4. CVA -   5. DVT:   -   6. Knee arthritis:  .  7. Chronic combined systolic and diastolic congestive heart failure :       Current medicines are reviewed at length with the patient today.  The patient does not have concerns regarding medicines.  The  following changes have been made:  no change   Disposition:   FU with me in 6  months      Mertie Moores, MD  07/03/2018 5:40 PM    [facility] [address], [place], [place]  [address] Phone: [phone]; Fax: [phone]

## 2018-07-03 NOTE — Telephone Encounter (Signed)
Called & LMOVM Gabapentin was refilled 05/09/2018 for 90 day supply to last until 08/08/2018 - sent to [facility] Prilosec was refilled.   Suggested pt's dtr call [facility] to [facility] about refill.

## 2018-07-03 NOTE — Patient Instructions (Addendum)
Medication Instructions:  Your physician has recommended you make the following change in your medication:   START Isosorbide (Imdur) 30 mg once daily Nitroglycerin refill has also been sent to your pharmacy  If you need a refill on your cardiac medications before your next appointment, please call your pharmacy.   Lab work: None Ordered  If you have labs (blood work) drawn today and your tests are completely normal, you will receive your results only by: Marland Kitchen MyChart Message (if you have MyChart) OR . A paper copy in the mail If you have any lab test that is abnormal or we need to change your treatment, we will call you to review the results.   Testing/Procedures: None Ordered   Follow-Up: At [facility], you and your health needs are our priority.  As part of our continuing mission to provide you with exceptional heart care, we have created designated Provider Care Teams.  These Care Teams include your primary Cardiologist (physician) and Advanced Practice Providers (APPs -  Physician Assistants and Nurse Practitioners) who all work together to provide you with the care you need, when you need it. You will need a follow up appointment in:  3 months. You may see Mertie Moores, MD or one of the following Advanced Practice Providers on your designated Care Team: Richardson Dopp, PA-C [place], [place] . Daune Perch, NP

## 2018-07-08 DIAGNOSIS — D631 Anemia in chronic kidney disease: Secondary | ICD-10-CM | POA: Diagnosis not present

## 2018-07-08 DIAGNOSIS — N2581 Secondary hyperparathyroidism of renal origin: Secondary | ICD-10-CM | POA: Diagnosis not present

## 2018-07-08 DIAGNOSIS — Z720 Tobacco use: Secondary | ICD-10-CM | POA: Diagnosis not present

## 2018-07-08 DIAGNOSIS — Z803 Family history of malignant neoplasm of breast: Secondary | ICD-10-CM | POA: Diagnosis not present

## 2018-07-08 DIAGNOSIS — N6320 Unspecified lump in the left breast, unspecified quadrant: Secondary | ICD-10-CM | POA: Diagnosis not present

## 2018-07-08 DIAGNOSIS — N183 Chronic kidney disease, stage 3 (moderate): Secondary | ICD-10-CM | POA: Diagnosis not present

## 2018-07-10 ENCOUNTER — Other Ambulatory Visit: Payer: Self-pay | Admitting: General Surgery

## 2018-07-10 DIAGNOSIS — N6320 Unspecified lump in the left breast, unspecified quadrant: Secondary | ICD-10-CM

## 2018-07-14 ENCOUNTER — Other Ambulatory Visit: Payer: Self-pay | Admitting: Nurse Practitioner

## 2018-07-14 ENCOUNTER — Ambulatory Visit
Admission: RE | Admit: 2018-07-14 | Discharge: 2018-07-14 | Disposition: A | Discharge status: Home / Self Care | Payer: PPO | Source: Ambulatory Visit | Attending: General Surgery | Admitting: General Surgery

## 2018-07-14 DIAGNOSIS — N6320 Unspecified lump in the left breast, unspecified quadrant: Secondary | ICD-10-CM

## 2018-07-14 DIAGNOSIS — R922 Inconclusive mammogram: Secondary | ICD-10-CM | POA: Diagnosis not present

## 2018-07-14 MED ORDER — ISOSORBIDE MONONITRATE ER 30 MG PO TB24: 30.0000 mg | ORAL_TABLET | Freq: Every day | ORAL | 3 refills | Status: DC

## 2018-07-14 NOTE — Telephone Encounter (Signed)
Patient daughter [name] Elliott is calling back and states she is the one that fills his medication and she is unsure how he is out. She is going to contact [facility] but also would like a call back from the office. She states he has been out for a week.   [phone]

## 2018-07-17 ENCOUNTER — Telehealth: Payer: Self-pay | Admitting: Physician Assistant

## 2018-07-17 NOTE — Telephone Encounter (Signed)
Copied from [place] [id number]. Topic: General - Other >> Jul 17, 2018  1:16 PM Selinda Flavin B, NT wrote: Reason for CRM: Patient's daughter, [name], calling and states that she is needing a letter stating that the patient is elderly and that she has to take care of him. States that she missed jury duty because he had to get a mammogram and they are requesting this letter. Please advise.

## 2018-07-21 NOTE — Telephone Encounter (Signed)
Per pt's daughter, she doesn't know why the patient is out of his Gabapentin. She stated that "she puts his medication in a pill box from Monday to Sunday. She put 2 in for the am., 2 for midday, and 2 pills at night everyday. She wonders if the mail order didn't send him enough. I asked her is there anyone else that comes into the home, but their is no one that she is aware of. She doesn't think that the patient is over using medication, because she says that he doesn't like taken medications. She also, stated that pt will no longer use mail order pharmacy. He wants medication sent to [place] [place] She wanted you to know that the patient had a mammogram, and it didn't show that he has breast cancer. Pt's daughter [name] is asking if she can get a letter for [employer] from Nov. 12, 2019, she  missed jury duty, letter need to state that she is the caregiver for pt. She can be reached at [phone], if you have any questions.

## 2018-07-27 ENCOUNTER — Encounter (HOSPITAL_COMMUNITY): Payer: Self-pay | Admitting: Emergency Medicine

## 2018-07-27 ENCOUNTER — Telehealth: Payer: Self-pay | Admitting: Internal Medicine

## 2018-07-27 ENCOUNTER — Other Ambulatory Visit: Payer: Self-pay

## 2018-07-27 ENCOUNTER — Emergency Department (HOSPITAL_COMMUNITY): Payer: PPO

## 2018-07-27 ENCOUNTER — Emergency Department (HOSPITAL_COMMUNITY)
Admission: EM | Admit: 2018-07-27 | Discharge: 2018-07-27 | Disposition: A | Discharge status: Home / Self Care | Payer: PPO | Attending: Emergency Medicine | Admitting: Emergency Medicine

## 2018-07-27 DIAGNOSIS — F17220 Nicotine dependence, chewing tobacco, uncomplicated: Secondary | ICD-10-CM | POA: Insufficient documentation

## 2018-07-27 DIAGNOSIS — N183 Chronic kidney disease, stage 3 (moderate): Secondary | ICD-10-CM | POA: Diagnosis not present

## 2018-07-27 DIAGNOSIS — Z7982 Long term (current) use of aspirin: Secondary | ICD-10-CM | POA: Insufficient documentation

## 2018-07-27 DIAGNOSIS — R05 Cough: Secondary | ICD-10-CM | POA: Diagnosis not present

## 2018-07-27 DIAGNOSIS — E1122 Type 2 diabetes mellitus with diabetic chronic kidney disease: Secondary | ICD-10-CM | POA: Insufficient documentation

## 2018-07-27 DIAGNOSIS — E039 Hypothyroidism, unspecified: Secondary | ICD-10-CM | POA: Insufficient documentation

## 2018-07-27 DIAGNOSIS — I130 Hypertensive heart and chronic kidney disease with heart failure and stage 1 through stage 4 chronic kidney disease, or unspecified chronic kidney disease: Secondary | ICD-10-CM | POA: Diagnosis not present

## 2018-07-27 DIAGNOSIS — Z8673 Personal history of transient ischemic attack (TIA), and cerebral infarction without residual deficits: Secondary | ICD-10-CM | POA: Insufficient documentation

## 2018-07-27 DIAGNOSIS — R079 Chest pain, unspecified: Secondary | ICD-10-CM | POA: Diagnosis not present

## 2018-07-27 DIAGNOSIS — Z89021 Acquired absence of right finger(s): Secondary | ICD-10-CM | POA: Diagnosis not present

## 2018-07-27 DIAGNOSIS — I5022 Chronic systolic (congestive) heart failure: Secondary | ICD-10-CM | POA: Diagnosis not present

## 2018-07-27 DIAGNOSIS — Z79899 Other long term (current) drug therapy: Secondary | ICD-10-CM | POA: Diagnosis not present

## 2018-07-27 DIAGNOSIS — I2510 Atherosclerotic heart disease of native coronary artery without angina pectoris: Secondary | ICD-10-CM | POA: Diagnosis not present

## 2018-07-27 LAB — BASIC METABOLIC PANEL
ANION GAP: 8 (ref 5–15)
BUN: 17 mg/dL (ref 8–23)
CHLORIDE: 107 mmol/L (ref 98–111)
CO2: 19 mmol/L — ABNORMAL LOW (ref 22–32)
CREATININE: 1.33 mg/dL — AB (ref 0.61–1.24)
Calcium: 9.6 mg/dL (ref 8.9–10.3)
GFR calc non Af Amer: 46 mL/min — ABNORMAL LOW (ref >60)
GFR, EST AFRICAN AMERICAN: 53 mL/min — AB (ref >60)
Glucose, Bld: 124 mg/dL — ABNORMAL HIGH (ref 70–99)
POTASSIUM: 4.1 mmol/L (ref 3.5–5.1)
SODIUM: 134 mmol/L — AB (ref 135–145)

## 2018-07-27 LAB — CBC
HCT: 42.9 % (ref 39.0–52.0)
Hemoglobin: 13 g/dL (ref 13.0–17.0)
MCH: 28.3 pg (ref 26.0–34.0)
MCHC: 30.3 g/dL (ref 30.0–36.0)
MCV: 93.5 fL (ref 80.0–100.0)
NRBC: 0 % (ref 0.0–0.2)
Platelets: 216 10*3/uL (ref 150–400)
RBC: 4.59 MIL/uL (ref 4.22–5.81)
RDW: 15.8 % — AB (ref 11.5–15.5)
WBC: 7.3 10*3/uL (ref 4.0–10.5)

## 2018-07-27 LAB — I-STAT TROPONIN, ED: TROPONIN I, POC: 0.01 ng/mL (ref 0.00–0.08)

## 2018-07-27 MED ORDER — NITROGLYCERIN 2 % TD OINT
1.0000 [in_us] | TOPICAL_OINTMENT | Freq: Once | TRANSDERMAL | Status: AC
  Administered 2018-07-27: 1 [in_us] via TOPICAL
  Filled 2018-07-27: qty 1

## 2018-07-27 MED ORDER — ASPIRIN 81 MG PO CHEW
324.0000 mg | CHEWABLE_TABLET | Freq: Once | ORAL | Status: AC
  Administered 2018-07-27: 324 mg via ORAL
  Filled 2018-07-27: qty 4

## 2018-07-27 MED ORDER — ISOSORBIDE MONONITRATE ER 30 MG PO TB24: 60.0000 mg | ORAL_TABLET | Freq: Every day | ORAL | 3 refills | Status: DC

## 2018-07-27 MED ORDER — NITROGLYCERIN 0.4 MG SL SUBL: 0.4000 mg | SUBLINGUAL_TABLET | SUBLINGUAL | Status: DC | PRN

## 2018-07-27 NOTE — Discharge Instructions (Signed)
Increase your Imdur to 60 mg/day.  Follow-up with your cardiologist this week

## 2018-07-27 NOTE — Telephone Encounter (Signed)
Cardiology moonlighter note  Return page from patient's daughter.  Patient has history of intermittent stable angina.  Uses nitroglycerin at home occasionally.  Today patient has had escalation of chest discomfort.  Has been taking nitroglycerin tablets with minimal relief.  Has known diffuse coronary disease with multiple flow-limiting lesions that were thought not to be amenable to PCI.  Has been treated medically for these by Dr. Acie Fredrickson.  Given the escalation in the patient's symptoms and the fact that he has not experienced relief from sublingual nitroglycerin, I recommended that the patient's daughter bring him to the emergency department for evaluation.  She is in agreement with this.  Once the patient arrives, he will need a rule out for acute coronary syndrome.  If he rules out with negative troponins, we will likely proceed with escalating medical therapy for coronary disease.  If we cannot get him symptom-free with medical therapy alone, we will have to discuss possible coronary angiogram to evaluate whether there is anything that can be intervened upon.  A copy of this note will be sent to Dr. Acie Fredrickson.   Marcie Mowers, MD Cardiology Fellow, PGY-6

## 2018-07-27 NOTE — ED Triage Notes (Signed)
Pt reports intermittent chest pain that is sharp in nature for the past 2 weeks. Pt's daughter called his cardiologist who insisted the pt come in for some blood work to rule out any cardiac problems.

## 2018-07-27 NOTE — ED Notes (Signed)
Patient verbalizes understanding of discharge instructions. Opportunity for questioning and answers were provided. Armband removed by staff, pt discharged from ED home via POV with family. 

## 2018-07-27 NOTE — ED Provider Notes (Signed)
[facility] EMERGENCY DEPARTMENT Provider Note   CSN: [id number] Arrival date & time: 07/27/18  1947     History   Chief Complaint Chief Complaint  Patient presents with  . Chest Pain    HPI [name] Elliott is a 84 y.o. male.  HPI Pt has been having intermittent chest pain over the last couple of weeks.  The pain is located in the left chest.   It lasts a couple of hours at a time.  The pain then resolves and will come back.  The pain increases with coughing.    He does feel short of breath. No fevers or chills.  NO nausea.  He has noticed some leg swelling.   Patient's daughter called the cardiologist on call.  They recommended he come to the emergency room for further evaluation.   Past Medical History:  Diagnosis Date  . Arthritis   . Arthropathy, unspecified, site unspecified   . Atrial fibrillation ([place])   . Blood dyscrasia    thromboctopenia pt family states he was never told of this  . CAD (coronary artery disease)    last cath in 2012. Managed medically-some blockages  . Chronic lower back pain   . Chronic systolic CHF (congestive heart failure) ([place]) 06/04/2016  . Constipation, chronic   . Diabetes mellitus without complication ([place])    pt states he doesn't have  . DVT (deep venous thrombosis) ([place])   . Dysrhythmia   . Esophageal reflux   . 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 MRI Nov '11 - tendonosis and partial tear. Left shoulder surgery Jan '12 for partial shoulder replacement. [name])   . Headache(784.0)   . History of stomach ulcers 11/2012  . Hypothyroidism    pt states he doeen't have   . Orthostasis   . Other B-complex deficiencies   . Other malaise and fatigue   . Pain in joint, shoulder region    Has chronic shoulder pain  . Pain in limb   . Persistent disorder of initiating or maintaining sleep   . Pneumonia 1980's?; 2011  . Primary localized osteoarthritis of left knee 07/12/2015  . Pure  hypercholesterolemia   . PVC's (premature ventricular contractions)   . S/P cardiac cath 08/08/11   mild to moderate CAD primarily in the LAD. None are obstructive and appear stable from prior cath in 2007; managed medically  . Sebaceous cyst   . Stroke ([place])   . Thrombocytopenia, unspecified ([place])   . Unspecified essential hypertension   . Vascular dementia without behavioral disturbance ([place])    with periods of amnesia    Patient Active Problem List   Diagnosis Date Noted  . Memory loss 12/05/2017  . Orthostatic dizziness 12/05/2017  . Idiopathic peripheral neuropathy 12/05/2017  . PAF (paroxysmal atrial fibrillation) ([place]) 04/30/2017  . Shortness of breath 06/04/2016  . Chronic systolic CHF (congestive heart failure) ([place]) 06/04/2016  . Bilateral leg edema 12/12/2015  . Dizziness 09/27/2015  . Primary localized osteoarthritis of left knee 07/12/2015  . Weakness 02/23/2015  . DVT (deep venous thrombosis) ([place]) 12/14/2014  . Combined congestive systolic and diastolic heart failure ([place]) 12/14/2014  . Cerebral infarction due to unspecified mechanism   . TIA (transient ischemic attack) 08/01/2014  . Varicose veins of both legs with edema 05/07/2014  . Bilateral leg pain 04/16/2014  . Chronic kidney disease, stage III (moderate) ([place]) 12/21/2013  . Vascular dementia without behavioral disturbance ([place])   . Type 2 diabetes mellitus, controlled,  with renal complications ([place]) 64/33/2951  . Diastolic dysfunction 88/41/6606  . Knee pain, bilateral 03/01/2013  . Hereditary and idiopathic peripheral neuropathy 11/26/2012  . Osteoporosis, unspecified 11/26/2012  . GI bleed due to NSAIDs, DDX=Isch colitis, infectious colitis 11/19/2012  . Cough 05/29/2012  . 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 CONSTIPATION, CHRONIC 08/02/2010  . INSOMNIA, CHRONIC 03/08/2010  . Unspecified hypothyroidism 01/04/2010  . VITAMIN B12 DEFICIENCY 01/04/2010  . HYPERCHOLESTEROLEMIA  08/12/2009  . Essential hypertension 08/12/2009  . Coronary artery disease involving native coronary artery of native heart without angina pectoris 08/12/2009  . GERD 08/12/2009    Past Surgical History:  Procedure Laterality Date  . BACK SURGERY    . CARDIAC CATHETERIZATION  08/2011  . CATARACT EXTRACTION Left 2009  . COLONOSCOPY    . ESOPHAGOGASTRODUODENOSCOPY N/A 11/19/2012   Procedure: ESOPHAGOGASTRODUODENOSCOPY (EGD);  Surgeon: Jerene Bears, MD;  Location: [facility];  Service: Gastroenterology;  Laterality: N/A;  . FINGER AMPUTATION Right    pinky finger  . HAND SURGERY Right    crush injury, right fifth digit contracture, limited  motion  . HARDWARE REMOVAL  02/05/2012   Procedure: HARDWARE REMOVAL;  Surgeon: Johnny Bridge, MD;  Location: [facility];  Service: Orthopedics;  Laterality: Left;  . KNEE SURGERY Left 1991   "did it twice in 1 wk" (02/17/2013)  . LUMBAR SPINE SURGERY  2007   Dr Philip Aspen ([place])  . REVERSE SHOULDER ARTHROPLASTY  02/05/2012   Procedure: REVERSE SHOULDER ARTHROPLASTY;  Surgeon: Johnny Bridge, MD;  Location: [facility];  Service: Orthopedics;  Laterality: Left;  . SHOULDER HEMI-ARTHROPLASTY    . SHOULDER OPEN ROTATOR CUFF REPAIR Left 8.30.2011  . TOTAL KNEE ARTHROPLASTY Left 07/12/2015   Procedure: TOTAL KNEE ARTHROPLASTY;  Surgeon: Marchia Bond, MD;  Location: [facility];  Service: Orthopedics;  Laterality: Left;  . US ECHOCARDIOGRAPHY  02-28-2010   Est EF 50-55%        Home Medications    Prior to Admission medications   Medication Sig Start Date End Date Taking? Authorizing Provider  aspirin EC 81 MG tablet Take 81 mg by mouth daily.     [provider]  bismuth subsalicylate (PEPTO BISMOL) 262 MG/15ML suspension Take 30 mLs by mouth as directed.    [provider]  furosemide (LASIX) 40 MG tablet Take 1 tablet (40 mg total) by mouth daily. 07/03/18   Nahser, Wonda Cheng, MD  gabapentin (NEURONTIN) 300 MG capsule Take 2 capsules (600  mg) by mouth three times a day 05/09/18   McVey, Gelene Mink, PA-C  isosorbide mononitrate (IMDUR) 30 MG 24 hr tablet Take 2 tablets (60 mg total) by mouth daily. 07/27/18   Dorie Rank, MD  levothyroxine (SYNTHROID, LEVOTHROID) 50 MCG tablet Take 1 tablet (50 mcg total) by mouth daily. 02/07/18   McVey, Gelene Mink, PA-C  magnesium hydroxide (MILK OF MAGNESIA) 400 MG/5ML suspension Take 15 mLs by mouth daily as needed for mild constipation or moderate constipation.    [provider]  mupirocin ointment (BACTROBAN) 2 % Apply 1 application topically 3 (three) times daily. 05/01/18   McVey, Gelene Mink, PA-C  nitroGLYCERIN (NITROSTAT) 0.4 MG SL tablet Place 1 tablet (0.4 mg total) under the tongue every 5 (five) minutes as needed for chest pain. 07/03/18   Nahser, Wonda Cheng, MD  omeprazole (PRILOSEC) 20 MG capsule Take 1 capsule (20 mg total) by mouth 2 (two) times daily before a meal. 07/01/18   McVey, Gelene Mink, PA-C  potassium chloride SA (K-DUR,KLOR-CON) 20 MEQ tablet Take 1 tablet (20 mEq total) by mouth 2 (two) times daily. 06/10/18   McVey, Gelene Mink, PA-C    Family History Family History  Problem Relation Age of Onset  . Breast cancer Mother   . Cancer Mother   . Diabetes Mother   . Cancer Brother        throat  . Stroke Brother   . Kidney disease Father   . Diabetes Sister   . Diabetes Daughter   . Colon cancer Neg Hx   . Heart attack Neg Hx     Social History Social History   Tobacco Use  . Smoking status: Former Smoker    Types: Cigars    Last attempt to quit: 09/03/1978    Years since quitting: 39.9  . Smokeless tobacco: Current User    Types: Chew  . Tobacco comment: 02/17/2013 "I aien't smoked a cigar in 20-30 yr"  Substance Use Topics  . Alcohol use: No    Alcohol/week: 0.0 standard drinks  . Drug use: No     Allergies   Crestor [rosuvastatin calcium] and Mestinon [pyridostigmine bromide er]   Review of Systems Review of  Systems  All other systems reviewed and are negative.    Physical Exam Updated Vital Signs BP 117/65   Pulse 63   Temp 97.8 F (36.6 C) (Oral)   Resp 20   Ht 1.651 m (5\' 5" )   Wt 76.1 kg   SpO2 98%   BMI 27.92 kg/m   Physical Exam  Constitutional: No distress.  HENT:  Head: Normocephalic and atraumatic.  Right Ear: External ear normal.  Left Ear: External ear normal.  Eyes: Conjunctivae are normal. Right eye exhibits no discharge. Left eye exhibits no discharge. No scleral icterus.  Neck: Neck supple. No tracheal deviation present.  Cardiovascular: Normal rate, regular rhythm and intact distal pulses.  Pulmonary/Chest: Effort normal and breath sounds normal. No stridor. No respiratory distress. He has no wheezes. He has no rales.  Abdominal: Soft. Bowel sounds are normal. He exhibits no distension. There is no tenderness. There is no rebound and no guarding.  Musculoskeletal: He exhibits edema. He exhibits no tenderness.  Neurological: He is alert. He has normal strength. No cranial nerve deficit (no facial droop, extraocular movements intact, no slurred speech) or sensory deficit. He exhibits normal muscle tone. He displays no seizure activity. Coordination normal.  Skin: Skin is warm and dry. No rash noted.  Psychiatric: He has a normal mood and affect.  Nursing note and vitals reviewed.    ED Treatments / Results  Labs (all labs ordered are listed, but only abnormal results are displayed) Labs Reviewed  BASIC METABOLIC PANEL - Abnormal; Notable for the following components:      Result Value   Sodium 134 (*)    CO2 19 (*)    Glucose, Bld 124 (*)    Creatinine, Ser 1.33 (*)    GFR calc non Af Amer 46 (*)    GFR calc Af Amer 53 (*)    All other components within normal limits  CBC - Abnormal; Notable for the following components:   RDW 15.8 (*)    All other components within normal limits  I-STAT TROPONIN, ED    EKG  EKG Interpretation  Date/Time:  Sunday  July 27 2018 20:54:59 EST Ventricular Rate:  91 PR Interval:  170 QRS Duration: 98 QT Interval:  376 QTC Calculation: 462 R Axis:   -54  Text Interpretation:  Normal sinus rhythm Incomplete right bundle branch block Left anterior fascicular block Abnormal ECG No significant change since last tracing Confirmed by Dorie Rank [phone]) on 07/27/2018 8:03:05 PM       Radiology Dg Chest Portable 1 View  Result Date: 07/27/2018 CLINICAL DATA:  84 year old male with history of mid chest pain. EXAM: PORTABLE CHEST 1 VIEW COMPARISON:  Chest x-ray 12/12/2017. FINDINGS: Lung volumes are normal. No consolidative airspace disease. No pleural effusions. No pneumothorax. No pulmonary nodule or mass noted. Pulmonary vasculature and the cardiomediastinal silhouette are within normal limits. Atherosclerosis in the thoracic aorta. Status post left shoulder arthroplasty. IMPRESSION: 1.  No radiographic evidence of acute cardiopulmonary disease. 2. Aortic atherosclerosis. Electronically Signed   By: Vinnie Langton M.D.   On: 07/27/2018 20:50    Procedures Procedures (including critical care time)  Medications Ordered in ED Medications  nitroGLYCERIN (NITROSTAT) SL tablet 0.4 mg (has no administration in time range)  aspirin chewable tablet 324 mg (324 mg Oral Given 07/27/18 2041)  nitroGLYCERIN (NITROGLYN) 2 % ointment 1 inch (1 inch Topical Given 07/27/18 2041)     Initial Impression / Assessment and Plan / ED Course  I have reviewed the triage vital signs and the nursing notes.  Pertinent labs & imaging results that were available during my care of the patient were reviewed by me and considered in my medical decision making (see chart for details).    Patient presented to the emergency room for evaluation of chest pain.  He has a known history of coronary artery disease.  Patient did not have any lesions amenable to cardiac intervention.  Medical management was recommended.  Reviewed the  cardiologist note from this evening.  Admission for cardiac rule out was recommended.  I recommended admission to the hospital as per the cardiologist notes.  Pt refuses.  I had a long discussion with him and his daughter.  He understands he could have a heart attack.  I discussed the case with Dr Emilio Aspen.  Will have him increase his imdur.  Follow up with cardiology  Final Clinical Impressions(s) / ED Diagnoses   Final diagnoses:  Chest pain, unspecified type    ED Discharge Orders         Ordered    isosorbide mononitrate (IMDUR) 30 MG 24 hr tablet  Daily     07/27/18 2143           Dorie Rank, MD 07/27/18 2144

## 2018-07-29 ENCOUNTER — Encounter: Payer: Self-pay | Admitting: Physician Assistant

## 2018-07-29 ENCOUNTER — Ambulatory Visit (INDEPENDENT_AMBULATORY_CARE_PROVIDER_SITE_OTHER): Payer: PPO | Admitting: Physician Assistant

## 2018-07-29 ENCOUNTER — Emergency Department (HOSPITAL_COMMUNITY)
Admission: EM | Admit: 2018-07-29 | Discharge: 2018-07-29 | Disposition: A | Discharge status: Home / Self Care | Payer: PPO | Attending: Emergency Medicine | Admitting: Emergency Medicine

## 2018-07-29 ENCOUNTER — Other Ambulatory Visit: Payer: Self-pay

## 2018-07-29 ENCOUNTER — Emergency Department (HOSPITAL_COMMUNITY): Payer: PPO

## 2018-07-29 ENCOUNTER — Encounter (HOSPITAL_COMMUNITY): Payer: Self-pay

## 2018-07-29 ENCOUNTER — Other Ambulatory Visit: Payer: Self-pay | Admitting: Physician Assistant

## 2018-07-29 VITALS — BP 100/62 | HR 89 | Ht 65.0 in | Wt 165.4 lb

## 2018-07-29 DIAGNOSIS — E039 Hypothyroidism, unspecified: Secondary | ICD-10-CM | POA: Diagnosis not present

## 2018-07-29 DIAGNOSIS — Z79899 Other long term (current) drug therapy: Secondary | ICD-10-CM | POA: Insufficient documentation

## 2018-07-29 DIAGNOSIS — K5730 Diverticulosis of large intestine without perforation or abscess without bleeding: Secondary | ICD-10-CM | POA: Diagnosis not present

## 2018-07-29 DIAGNOSIS — R0789 Other chest pain: Secondary | ICD-10-CM | POA: Diagnosis not present

## 2018-07-29 DIAGNOSIS — N183 Chronic kidney disease, stage 3 unspecified: Secondary | ICD-10-CM

## 2018-07-29 DIAGNOSIS — Z8673 Personal history of transient ischemic attack (TIA), and cerebral infarction without residual deficits: Secondary | ICD-10-CM | POA: Insufficient documentation

## 2018-07-29 DIAGNOSIS — I130 Hypertensive heart and chronic kidney disease with heart failure and stage 1 through stage 4 chronic kidney disease, or unspecified chronic kidney disease: Secondary | ICD-10-CM | POA: Insufficient documentation

## 2018-07-29 DIAGNOSIS — I480 Paroxysmal atrial fibrillation: Secondary | ICD-10-CM | POA: Diagnosis not present

## 2018-07-29 DIAGNOSIS — Z96652 Presence of left artificial knee joint: Secondary | ICD-10-CM | POA: Diagnosis not present

## 2018-07-29 DIAGNOSIS — I10 Essential (primary) hypertension: Secondary | ICD-10-CM | POA: Diagnosis not present

## 2018-07-29 DIAGNOSIS — R079 Chest pain, unspecified: Secondary | ICD-10-CM | POA: Diagnosis not present

## 2018-07-29 DIAGNOSIS — I5042 Chronic combined systolic (congestive) and diastolic (congestive) heart failure: Secondary | ICD-10-CM

## 2018-07-29 DIAGNOSIS — G609 Hereditary and idiopathic neuropathy, unspecified: Secondary | ICD-10-CM

## 2018-07-29 DIAGNOSIS — R109 Unspecified abdominal pain: Secondary | ICD-10-CM

## 2018-07-29 DIAGNOSIS — I2510 Atherosclerotic heart disease of native coronary artery without angina pectoris: Secondary | ICD-10-CM

## 2018-07-29 DIAGNOSIS — Z7982 Long term (current) use of aspirin: Secondary | ICD-10-CM | POA: Diagnosis not present

## 2018-07-29 DIAGNOSIS — E1122 Type 2 diabetes mellitus with diabetic chronic kidney disease: Secondary | ICD-10-CM | POA: Insufficient documentation

## 2018-07-29 DIAGNOSIS — N644 Mastodynia: Secondary | ICD-10-CM | POA: Insufficient documentation

## 2018-07-29 DIAGNOSIS — F0150 Vascular dementia without behavioral disturbance: Secondary | ICD-10-CM | POA: Insufficient documentation

## 2018-07-29 DIAGNOSIS — Z8719 Personal history of other diseases of the digestive system: Secondary | ICD-10-CM | POA: Diagnosis not present

## 2018-07-29 DIAGNOSIS — Z87891 Personal history of nicotine dependence: Secondary | ICD-10-CM | POA: Insufficient documentation

## 2018-07-29 DIAGNOSIS — R1012 Left upper quadrant pain: Secondary | ICD-10-CM | POA: Insufficient documentation

## 2018-07-29 LAB — COMPREHENSIVE METABOLIC PANEL
ALBUMIN: 3.3 g/dL — AB (ref 3.5–5.0)
ALT: 17 U/L (ref 0–44)
ANION GAP: 8 (ref 5–15)
AST: 23 U/L (ref 15–41)
Alkaline Phosphatase: 43 U/L (ref 38–126)
BUN: 18 mg/dL (ref 8–23)
CO2: 19 mmol/L — ABNORMAL LOW (ref 22–32)
CREATININE: 1.37 mg/dL — AB (ref 0.61–1.24)
Calcium: 9.1 mg/dL (ref 8.9–10.3)
Chloride: 108 mmol/L (ref 98–111)
GFR calc non Af Amer: 46 mL/min — ABNORMAL LOW (ref >60)
GFR, EST AFRICAN AMERICAN: 53 mL/min — AB (ref >60)
Glucose, Bld: 111 mg/dL — ABNORMAL HIGH (ref 70–99)
Potassium: 3.8 mmol/L (ref 3.5–5.1)
Sodium: 135 mmol/L (ref 135–145)
Total Bilirubin: 1.7 mg/dL — ABNORMAL HIGH (ref 0.3–1.2)
Total Protein: 6.6 g/dL (ref 6.5–8.1)

## 2018-07-29 LAB — URINALYSIS, ROUTINE W REFLEX MICROSCOPIC
Bilirubin Urine: NEGATIVE
Glucose, UA: NEGATIVE mg/dL
Hgb urine dipstick: NEGATIVE
KETONES UR: NEGATIVE mg/dL
LEUKOCYTES UA: NEGATIVE
NITRITE: NEGATIVE
PH: 5 (ref 5.0–8.0)
PROTEIN: NEGATIVE mg/dL
Specific Gravity, Urine: 1.019 (ref 1.005–1.030)

## 2018-07-29 LAB — CBC
HEMATOCRIT: 40.7 % (ref 39.0–52.0)
Hemoglobin: 12.4 g/dL — ABNORMAL LOW (ref 13.0–17.0)
MCH: 28.8 pg (ref 26.0–34.0)
MCHC: 30.5 g/dL (ref 30.0–36.0)
MCV: 94.7 fL (ref 80.0–100.0)
NRBC: 0 % (ref 0.0–0.2)
Platelets: 205 10*3/uL (ref 150–400)
RBC: 4.3 MIL/uL (ref 4.22–5.81)
RDW: 15.8 % — AB (ref 11.5–15.5)
WBC: 6.9 10*3/uL (ref 4.0–10.5)

## 2018-07-29 LAB — LIPASE, BLOOD: LIPASE: 23 U/L (ref 11–51)

## 2018-07-29 LAB — I-STAT TROPONIN, ED: Troponin i, poc: 0 ng/mL (ref 0.00–0.08)

## 2018-07-29 MED ORDER — TRAMADOL HCL 50 MG PO TABS: 50.0000 mg | ORAL_TABLET | Freq: Four times a day (QID) | ORAL | 0 refills | Status: DC | PRN

## 2018-07-29 MED ORDER — GABAPENTIN 300 MG PO CAPS: ORAL_CAPSULE | ORAL | 1 refills | Status: DC

## 2018-07-29 MED ORDER — ONDANSETRON HCL 4 MG/2ML IJ SOLN
4.0000 mg | Freq: Once | INTRAMUSCULAR | Status: AC
  Administered 2018-07-29: 4 mg via INTRAVENOUS
  Filled 2018-07-29: qty 2

## 2018-07-29 MED ORDER — SODIUM CHLORIDE 0.9 % IV BOLUS
500.0000 mL | Freq: Once | INTRAVENOUS | Status: AC
  Administered 2018-07-29: 500 mL via INTRAVENOUS

## 2018-07-29 MED ORDER — IOHEXOL 300 MG/ML  SOLN
100.0000 mL | Freq: Once | INTRAMUSCULAR | Status: AC | PRN
  Administered 2018-07-29: 100 mL via INTRAVENOUS

## 2018-07-29 MED ORDER — FENTANYL CITRATE (PF) 100 MCG/2ML IJ SOLN
50.0000 ug | Freq: Once | INTRAMUSCULAR | Status: AC
  Administered 2018-07-29: 50 ug via INTRAVENOUS
  Filled 2018-07-29: qty 2

## 2018-07-29 MED ORDER — DOCUSATE SODIUM 100 MG PO CAPS: 100.0000 mg | ORAL_CAPSULE | Freq: Two times a day (BID) | ORAL | 0 refills | Status: AC

## 2018-07-29 NOTE — Patient Instructions (Addendum)
You will need to go to the Emergency Room at  [facility].

## 2018-07-29 NOTE — ED Notes (Signed)
Pt being transported to CT

## 2018-07-29 NOTE — ED Provider Notes (Signed)
[facility] Provider Note   CSN: [id number] Arrival date & time: 07/29/18  1224     History   Chief Complaint Chief Complaint  Patient presents with  . Abdominal Pain  . Chest Pain    HPI [name] Elliott is a 84 y.o. male who presents the emergency department chief complaint abdominal pain.  Patient has had pain of the left upper side of his abdomen for several weeks.  His daughter who is at bedside gives the history.  There is a level 5 caveat.  She states that she is noticed increased distention in the left upper quadrant.  It is point tender to palpation.  She also notes that the patient has left breast pain and swelling.  This is Artie been worked up in the outpatient setting including ultrasound and mammogram which were negative for acute abnormality.  Patient complains of pain on the left side of his abdomen.  He does a lot of [place] lifting [place].  He has had no previous surgeries.  No nausea, vomiting, diarrhea or constipation noted.  Patient has not had any fevers.  It is made worse with palpation and twisting.  The history is provided by a relative. The history is limited by the condition of the patient. No language interpreter was used.  Abdominal Pain   Pertinent negatives include diarrhea, nausea and constipation.  Chest Pain   Associated symptoms include abdominal pain. Pertinent negatives include no nausea.     Past Medical History:  Diagnosis Date  . Arthritis   . Arthropathy, unspecified, site unspecified   . Atrial fibrillation ([place])   . Blood dyscrasia    thromboctopenia pt family states he was never told of this  . CAD (coronary artery disease)    last cath in 2012. Managed medically-some blockages  . Chronic lower back pain   . Chronic systolic CHF (congestive heart failure) ([place]) 06/04/2016  . Constipation, chronic   . Diabetes mellitus without complication ([place])    pt states he doesn't have  . DVT (deep venous  thrombosis) ([place])   . Dysrhythmia   . Esophageal reflux   . 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 MRI Nov '11 - tendonosis and partial tear. Left shoulder surgery Jan '12 for partial shoulder replacement. [name])   . Headache(784.0)   . History of stomach ulcers 11/2012  . Hypothyroidism    pt states he doeen't have   . Orthostasis   . Other B-complex deficiencies   . Other malaise and fatigue   . Pain in joint, shoulder region    Has chronic shoulder pain  . Pain in limb   . Persistent disorder of initiating or maintaining sleep   . Pneumonia 1980's?; 2011  . Primary localized osteoarthritis of left knee 07/12/2015  . Pure hypercholesterolemia   . PVC's (premature ventricular contractions)   . S/P cardiac cath 08/08/11   mild to moderate CAD primarily in the LAD. None are obstructive and appear stable from prior cath in 2007; managed medically  . Sebaceous cyst   . Stroke ([place])   . Thrombocytopenia, unspecified ([place])   . Unspecified essential hypertension   . Vascular dementia without behavioral disturbance ([place])    with periods of amnesia    Patient Active Problem List   Diagnosis Date Noted  . Memory loss 12/05/2017  . Orthostatic dizziness 12/05/2017  . Idiopathic peripheral neuropathy 12/05/2017  . PAF (paroxysmal atrial fibrillation) ([place]) 04/30/2017  . Shortness  of breath 06/04/2016  . Chronic systolic CHF (congestive heart failure) ([place]) 06/04/2016  . Bilateral leg edema 12/12/2015  . Dizziness 09/27/2015  . Primary localized osteoarthritis of left knee 07/12/2015  . Weakness 02/23/2015  . DVT (deep venous thrombosis) ([place]) 12/14/2014  . Combined congestive systolic and diastolic heart failure ([place]) 12/14/2014  . Cerebral infarction due to unspecified mechanism   . TIA (transient ischemic attack) 08/01/2014  . Varicose veins of both legs with edema 05/07/2014  . Bilateral leg pain 04/16/2014  . Chronic kidney disease, stage III  (moderate) ([place]) 12/21/2013  . Vascular dementia without behavioral disturbance ([place])   . Type 2 diabetes mellitus, controlled, with renal complications ([place]) 69/67/8938  . Diastolic dysfunction 06/19/5101  . Knee pain, bilateral 03/01/2013  . Hereditary and idiopathic peripheral neuropathy 11/26/2012  . Osteoporosis, unspecified 11/26/2012  . GI bleed due to NSAIDs, DDX=Isch colitis, infectious colitis 11/19/2012  . Cough 05/29/2012  . 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 CONSTIPATION, CHRONIC 08/02/2010  . INSOMNIA, CHRONIC 03/08/2010  . Unspecified hypothyroidism 01/04/2010  . VITAMIN B12 DEFICIENCY 01/04/2010  . HYPERCHOLESTEROLEMIA 08/12/2009  . Essential hypertension 08/12/2009  . CAD (coronary artery disease) 08/12/2009  . GERD 08/12/2009    Past Surgical History:  Procedure Laterality Date  . BACK SURGERY    . CARDIAC CATHETERIZATION  08/2011  . CATARACT EXTRACTION Left 2009  . COLONOSCOPY    . ESOPHAGOGASTRODUODENOSCOPY N/A 11/19/2012   Procedure: ESOPHAGOGASTRODUODENOSCOPY (EGD);  Surgeon: Jerene Bears, MD;  Location: [facility];  Service: Gastroenterology;  Laterality: N/A;  . FINGER AMPUTATION Right    pinky finger  . HAND SURGERY Right    crush injury, right fifth digit contracture, limited  motion  . HARDWARE REMOVAL  02/05/2012   Procedure: HARDWARE REMOVAL;  Surgeon: Johnny Bridge, MD;  Location: [facility];  Service: Orthopedics;  Laterality: Left;  . KNEE SURGERY Left 1991   "did it twice in 1 wk" (02/17/2013)  . LUMBAR SPINE SURGERY  2007   Dr Philip Aspen ([place])  . REVERSE SHOULDER ARTHROPLASTY  02/05/2012   Procedure: REVERSE SHOULDER ARTHROPLASTY;  Surgeon: Johnny Bridge, MD;  Location: [facility];  Service: Orthopedics;  Laterality: Left;  . SHOULDER HEMI-ARTHROPLASTY    . SHOULDER OPEN ROTATOR CUFF REPAIR Left 8.30.2011  . TOTAL KNEE ARTHROPLASTY Left 07/12/2015   Procedure: TOTAL KNEE ARTHROPLASTY;  Surgeon: Marchia Bond, MD;  Location:  [facility];  Service: Orthopedics;  Laterality: Left;  . US ECHOCARDIOGRAPHY  02-28-2010   Est EF 50-55%        Home Medications    Prior to Admission medications   Medication Sig Start Date End Date Taking? Authorizing Provider  aspirin EC 81 MG tablet Take 81 mg by mouth daily.    Yes [provider]  bismuth subsalicylate (PEPTO BISMOL) 262 MG/15ML suspension Take 30 mLs by mouth daily as needed for indigestion or diarrhea or loose stools.    Yes [provider]  furosemide (LASIX) 40 MG tablet Take 1 tablet (40 mg total) by mouth daily. 07/03/18  Yes Nahser, Wonda Cheng, MD  gabapentin (NEURONTIN) 300 MG capsule Take 2 capsules (600 mg) by mouth three times a day 07/29/18  Yes McVey, Gelene Mink, PA-C  isosorbide mononitrate (IMDUR) 30 MG 24 hr tablet Take 2 tablets (60 mg total) by mouth daily. 07/27/18  Yes Dorie Rank, MD  levothyroxine (SYNTHROID, LEVOTHROID) 50 MCG tablet Take 1 tablet (50 mcg total) by mouth daily. 02/07/18  Yes McVey, Benjamine Mola  Whitney, PA-C  magnesium hydroxide (MILK OF MAGNESIA) 400 MG/5ML suspension Take 15 mLs by mouth daily as needed for mild constipation or moderate constipation.   Yes [provider]  nitroGLYCERIN (NITROSTAT) 0.4 MG SL tablet Place 1 tablet (0.4 mg total) under the tongue every 5 (five) minutes as needed for chest pain. 07/03/18  Yes Nahser, Wonda Cheng, MD  omeprazole (PRILOSEC) 20 MG capsule Take 1 capsule (20 mg total) by mouth 2 (two) times daily before a meal. 07/01/18  Yes McVey, Gelene Mink, PA-C  potassium chloride SA (K-DUR,KLOR-CON) 20 MEQ tablet Take 1 tablet (20 mEq total) by mouth 2 (two) times daily. 06/10/18  Yes McVey, Gelene Mink, PA-C    Family History Family History  Problem Relation Age of Onset  . Breast cancer Mother   . Cancer Mother   . Diabetes Mother   . Cancer Brother        throat  . Stroke Brother   . Kidney disease Father   . Diabetes Sister   . Diabetes Daughter   .  Colon cancer Neg Hx   . Heart attack Neg Hx     Social History Social History   Tobacco Use  . Smoking status: Former Smoker    Types: Cigars    Last attempt to quit: 09/03/1978    Years since quitting: 39.9  . Smokeless tobacco: Current User    Types: Chew  . Tobacco comment: 02/17/2013 "I aien't smoked a cigar in 20-30 yr"  Substance Use Topics  . Alcohol use: No    Alcohol/week: 0.0 standard drinks  . Drug use: No     Allergies   Crestor [rosuvastatin calcium] and Mestinon [pyridostigmine bromide er]   Review of Systems Review of Systems  Cardiovascular: Positive for chest pain.  Gastrointestinal: Positive for abdominal pain. Negative for constipation, diarrhea and nausea.  All other systems reviewed and are negative. Ten systems reviewed and are negative for acute change, except as noted in the HPI.     Physical Exam Updated Vital Signs BP 122/61   Pulse (!) 58   Temp 97.8 F (36.6 C) (Oral)   Resp 20   SpO2 97%   Physical Exam  Constitutional: He appears well-developed and well-nourished. No distress.  HENT:  Head: Normocephalic and atraumatic.  Eyes: Conjunctivae are normal. No scleral icterus.  Neck: Normal range of motion. Neck supple.  Cardiovascular: Normal rate, regular rhythm and normal heart sounds.  Pulmonary/Chest: Effort normal and breath sounds normal. No respiratory distress.  Patient has palpable breast tissue and swelling of the left breast.  Is exquisitely tender to palpation.  No evidence of infection.  Abdominal: Soft. He exhibits distension. There is tenderness.  Distention of the left upper quadrant of the abdomen with tenderness.  Musculoskeletal: He exhibits no edema.  Neurological: He is alert.  Skin: Skin is warm and dry. He is not diaphoretic.  Psychiatric: His behavior is normal.  Nursing note and vitals reviewed.    ED Treatments / Results  Labs (all labs ordered are listed, but only abnormal results are displayed) Labs  Reviewed  COMPREHENSIVE METABOLIC PANEL - Abnormal; Notable for the following components:      Result Value   CO2 19 (*)    Glucose, Bld 111 (*)    Creatinine, Ser 1.37 (*)    Albumin 3.3 (*)    Total Bilirubin 1.7 (*)    GFR calc non Af Amer 46 (*)    GFR calc Af Amer 53 (*)  All other components within normal limits  CBC - Abnormal; Notable for the following components:   Hemoglobin 12.4 (*)    RDW 15.8 (*)    All other components within normal limits  LIPASE, BLOOD  URINALYSIS, ROUTINE W REFLEX MICROSCOPIC  I-STAT TROPONIN, ED    EKG EKG Interpretation  Date/Time:  Tuesday July 29 2018 12:44:57 EST Ventricular Rate:  76 PR Interval:  178 QRS Duration: 94 QT Interval:  400 QTC Calculation: 450 R Axis:   -115 Text Interpretation:  Normal sinus rhythm Right superior axis deviation Incomplete right bundle branch block Abnormal QRS-T angle, consider primary T wave abnormality Abnormal ECG no significant change from prior 11/19 Confirmed by Aletta Edouard [phone]) on 07/29/2018 2:55:07 PM   Radiology Dg Chest Portable 1 View  Result Date: 07/27/2018 CLINICAL DATA:  84 year old male with history of mid chest pain. EXAM: PORTABLE CHEST 1 VIEW COMPARISON:  Chest x-ray 12/12/2017. FINDINGS: Lung volumes are normal. No consolidative airspace disease. No pleural effusions. No pneumothorax. No pulmonary nodule or mass noted. Pulmonary vasculature and the cardiomediastinal silhouette are within normal limits. Atherosclerosis in the thoracic aorta. Status post left shoulder arthroplasty. IMPRESSION: 1.  No radiographic evidence of acute cardiopulmonary disease. 2. Aortic atherosclerosis. Electronically Signed   By: Vinnie Langton M.D.   On: 07/27/2018 20:50    Procedures Procedures (including critical care time)  Medications Ordered in ED Medications  sodium chloride 0.9 % bolus 500 mL (500 mLs Intravenous New Bag/Given 07/29/18 1535)  ondansetron (ZOFRAN) injection 4 mg (4  mg Intravenous Given 07/29/18 1536)  fentaNYL (SUBLIMAZE) injection 50 mcg (50 mcg Intravenous Given 07/29/18 1537)  iohexol (OMNIPAQUE) 300 MG/ML solution 100 mL (100 mLs Intravenous Contrast Given 07/29/18 1557)     Initial Impression / Assessment and Plan / ED Course  I have reviewed the triage vital signs and the nursing notes.  Pertinent labs & imaging results that were available during my care of the patient were reviewed by me and considered in my medical decision making (see chart for details).  Clinical Course as of Jul 29 2328  Tue Jul 29, 2018  1516 Hemoglobin(!): 12.4 [AH]  [id number] 84 year old male is been troubled by 1 month of some left breast into the left upper quadrant abdominal pain.  He went to his cardiologist but cardiology did not think this was related to his heart.  He was referred here and had a CAT scan that did not show any obvious explanations.  His lab work was also unremarkable.  Unfortunately do not have a good explanation for his pain but he should follow-up with his primary care doctor for continued management.   [MB]    Clinical Course User Index [AH] Margarita Mail, PA-C [MB] Hayden Rasmussen, MD    Patient's labs appear to be at baseline.  CT scan of the abdomen shows no acute abnormalities.  Given the clinical exam I suspect he has some abdominal wall herniation on the left side.  No evidence of splenomegaly.  No CVA tenderness.  Discussed avoiding heavy lifting.  Patient will be discharged with tramadol.  He appears appropriate for discharge at this time  Final Clinical Impressions(s) / ED Diagnoses   Final diagnoses:  Abdominal wall pain    ED Discharge Orders    None       Margarita Mail, PA-C 07/29/18 2334    Lajean Saver, MD 07/30/18 1525

## 2018-07-29 NOTE — Progress Notes (Signed)
Cardiology Office Note:    Date:  07/29/2018   ID:  Karolee Ohs, DOB [birth year], MRN [id number]  PCP:  Dorise Hiss, PA-C  Cardiologist:  Mertie Moores, MD  Electrophysiologist:  None   Referring MD: Desiree Lucy*   Chief Complaint  Patient presents with  . Chest Pain    History of Present Illness:    [name] Elliott is a 84 y.o. male with coronary artery disease, questionable history of atrial fibrillation, prior stroke, combined systolic and diastolic CHF, recurrent GI bleeding, chronic kidney disease, diabetes, hypertension, hyperlipidemia, venous insufficiency, prior DVT.  Cardiac catheterization 2012 demonstrated moderate diffuse CAD.  The LAD and D1 stenoses were not suitable for PCI and he has been managed medically.  Event monitor in 2015 after his stroke was negative for atrial fibrillation.  He was placed on Xarelto in 2018 for DVT.  He had recurrent GI bleeds and his anticoagulation was stopped in April 2019.  He was last seen by Dr. Acie Fredrickson in October 2019.  He presented to the emergency room 07/27/2018 with chest pain.  Initial troponin was negative.  Admission for observation was recommended but the patient declined.    [name] Elliott returns for evaluation of chest pain.  He is here today with his daughter.  He has had left chest discomfort for several weeks.  He was sent for mammogram to rule out breast cancer.  This was benign.  Recently, he has had difficulty with frequent urination.  However, he is not voiding much.  He has also developed left upper quadrant pain.  He is quite uncomfortable with just some changing positions.  He does feel more short of breath.  He stopped taking his Lasix several days ago due to his difficulty with voiding.  He has not had any fevers.  He denies any bloody bowel movements.  He denies syncope.     Prior CV studies:   The following studies were reviewed today:  Echocardiogram 06/15/2016 EF 40-45, diffuse HK, grade 1  diastolic dysfunction, mild to moderate AI, ascending aorta mildly dilated (39 mm)  Event monitor 12/15 NSR  Echo (11/15):   EF 45-50%, diffuse HK, grade 1 diastolic dysfunction, mild to moderate AI, mild MR  Carotid US (11/15):   Summary:  Findings suggest 1-39% internal carotid artery stenosis bilaterally.  LHC (12/12):   Proximal LAD 30-40%, D1 50-60%, proximal circumflex 20-30%, RCA with minor little irregularities, preserved LV function  Nuclear (9/12):   Small area of apical ischemia, EF 50% .  Past Medical History:  Diagnosis Date  . Arthritis   . Arthropathy, unspecified, site unspecified   . Atrial fibrillation ([place])   . Blood dyscrasia    thromboctopenia pt family states he was never told of this  . CAD (coronary artery disease)    last cath in 2012. Managed medically-some blockages  . Chronic lower back pain   . Chronic systolic CHF (congestive heart failure) ([place]) 06/04/2016  . Constipation, chronic   . Diabetes mellitus without complication ([place])    pt states he doesn't have  . DVT (deep venous thrombosis) ([place])   . Dysrhythmia   . Esophageal reflux   . 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 MRI Nov '11 - tendonosis and partial tear. Left shoulder surgery Jan '12 for partial shoulder replacement. [name] Elliott)   . Headache(784.0)   . History of stomach ulcers 11/2012  . Hypothyroidism    pt states he doeen't have   . Orthostasis   .  Other B-complex deficiencies   . Other malaise and fatigue   . Pain in joint, shoulder region    Has chronic shoulder pain  . Pain in limb   . Persistent disorder of initiating or maintaining sleep   . Pneumonia 1980's?; 2011  . Primary localized osteoarthritis of left knee 07/12/2015  . Pure hypercholesterolemia   . PVC's (premature ventricular contractions)   . S/P cardiac cath 08/08/11   mild to moderate CAD primarily in the LAD. None are obstructive and appear stable from prior cath in 2007; managed  medically  . Sebaceous cyst   . Stroke ([place])   . Thrombocytopenia, unspecified ([place])   . Unspecified essential hypertension   . Vascular dementia without behavioral disturbance (HCC)    with periods of amnesia   Surgical Hx: The patient  has a past surgical history that includes Hand surgery (Right); Lumbar spine surgery (2007); Knee surgery (Left, 1991); Cataract extraction (Left, 2009); Shoulder open rotator cuff repair (Left, 8.30.2011); US ECHOCARDIOGRAPHY (02-28-2010); Cardiac catheterization (08/2011); Shoulder hemi-arthroplasty; Finger amputation (Right); Hardware Removal (02/05/2012); Reverse shoulder arthroplasty (02/05/2012); Back surgery; Esophagogastroduodenoscopy (N/A, 11/19/2012); Colonoscopy; and Total knee arthroplasty (Left, 07/12/2015).   Current Medications: Current Meds  Medication Sig  . aspirin EC 81 MG tablet Take 81 mg by mouth daily.   Marland Kitchen bismuth subsalicylate (PEPTO BISMOL) 262 MG/15ML suspension Take 30 mLs by mouth as directed.  . furosemide (LASIX) 40 MG tablet Take 1 tablet (40 mg total) by mouth daily.  . isosorbide mononitrate (IMDUR) 30 MG 24 hr tablet Take 2 tablets (60 mg total) by mouth daily.  Marland Kitchen levothyroxine (SYNTHROID, LEVOTHROID) 50 MCG tablet Take 1 tablet (50 mcg total) by mouth daily.  . magnesium hydroxide (MILK OF MAGNESIA) 400 MG/5ML suspension Take 15 mLs by mouth daily as needed for mild constipation or moderate constipation.  . mupirocin ointment (BACTROBAN) 2 % Apply 1 application topically 3 (three) times daily.  . nitroGLYCERIN (NITROSTAT) 0.4 MG SL tablet Place 1 tablet (0.4 mg total) under the tongue every 5 (five) minutes as needed for chest pain.  Marland Kitchen omeprazole (PRILOSEC) 20 MG capsule Take 1 capsule (20 mg total) by mouth 2 (two) times daily before a meal.  . potassium chloride SA (K-DUR,KLOR-CON) 20 MEQ tablet Take 1 tablet (20 mEq total) by mouth 2 (two) times daily.  . [DISCONTINUED] gabapentin (NEURONTIN) 300 MG capsule Take 2 capsules (600  mg) by mouth three times a day     Allergies:   Crestor [rosuvastatin calcium] and Mestinon [pyridostigmine bromide er]   Social History   Tobacco Use  . Smoking status: Former Smoker    Types: Cigars    Last attempt to quit: 09/03/1978    Years since quitting: 39.9  . Smokeless tobacco: Current User    Types: Chew  . Tobacco comment: 02/17/2013 "I aien't smoked a cigar in 20-30 yr"  Substance Use Topics  . Alcohol use: No    Alcohol/week: 0.0 standard drinks  . Drug use: No     Family Hx: The patient's family history includes Breast cancer in his mother; Cancer in his brother and mother; Diabetes in his daughter, mother, and sister; Kidney disease in his father; Stroke in his brother. There is no history of Colon cancer or Heart attack.  ROS:   Please see the history of present illness.    Review of Systems  Cardiovascular: Positive for chest pain.  Neurological: Positive for headaches.   All other systems reviewed and are negative.  EKGs/Labs/Other Test Reviewed:    EKG:  EKG is  ordered today.  The ekg ordered today demonstrates NSR, HR 89, NSSTTW changes, QTc 472, no acute changes  Recent Labs: 10/29/2017: TSH 6.580 12/12/2017: B Natriuretic Peptide 93.3; Magnesium 2.9 07/29/2018: ALT 17; BUN 18; Creatinine, Ser 1.37; Hemoglobin 12.4; Platelets 205; Potassium 3.8; Sodium 135   Recent Lipid Panel Lab Results  Component Value Date/Time   CHOL 167 03/16/2017 11:10 AM   TRIG 79 03/16/2017 11:10 AM   HDL 62 03/16/2017 11:10 AM   CHOLHDL 2.7 03/16/2017 11:10 AM   CHOLHDL 2.5 06/04/2016 08:57 AM   LDLCALC 89 03/16/2017 11:10 AM   LDLDIRECT 140.6 09/25/2013 10:30 AM    Physical Exam:    VS:  BP 100/62   Pulse 89   Ht 5\' 5"  (1.651 m)   Wt 165 lb 6.4 oz (75 kg)   SpO2 97%   BMI 27.52 kg/m     Wt Readings from Last 3 Encounters:  07/29/18 165 lb 6.4 oz (75 kg)  07/27/18 167 lb 12.3 oz (76.1 kg)  07/03/18 167 lb 12.8 oz (76.1 kg)     Physical Exam    Constitutional: He is oriented to person, place, and time. He appears well-developed and well-nourished. He appears distressed.  HENT:  Head: Normocephalic and atraumatic.  Eyes: No scleral icterus.  Neck: No JVD present. No thyromegaly present.  Cardiovascular: Normal rate and regular rhythm.  No murmur heard. Pulmonary/Chest: Effort normal. He has no rales. He exhibits tenderness (L chest tender palpation).  Abdominal: He exhibits distension. There is tenderness in the left upper quadrant. There is no rigidity.  Musculoskeletal: He exhibits edema (1+ bilat edema).  Lymphadenopathy:    He has no cervical adenopathy.  Neurological: He is alert and oriented to person, place, and time.  Skin: Skin is warm and dry.  Psychiatric: He has a normal mood and affect.    ASSESSMENT & PLAN:    Other chest pain He presents with L chest and LUQ pain.  He is quite tender on exam and he appears quite uncomfortable at times.  He was also seen by Dr. Tamala Julian (attending MD) today.  His symptoms do not seem to be cardiac.  However, he needs further evaluation.  He has significant tenderness over his L chest/breast area.  Recent mammogram was benign.  He denies any trauma.  He has had urinary issues recently with increased urination but decreased voiding.  He also has a prior history of DVT.  Anticoagulation has been stopped secondary to recurrent GI bleeding.  Rib fracture, bladder outlet obstruction, other abdominal pathology, pulmonary embolism needs to be ruled out.  We have recommended he return to the ED for evaluation.  He is agreeable and his daughter will take him now.    Coronary artery disease involving native coronary artery of native heart without angina pectoris Moderate nonobstructive disease by cardiac catheterization in 2012.  He has been managed medically.'s.  His current chest discomfort does not seem to be cardiac.  Certainly, as part of his work-up, he should have serial cardiac enzymes to  rule out the possibility of myocardial infarction.  He remains on aspirin, nitrates.  Chronic combined systolic and diastolic congestive heart failure (HCC) EF 40-45 by echocardiogram in 2017 with mild diastolic dysfunction.  He has chronic lower extremity swelling.  He stopped taking Lasix several days ago due to his difficulty with voiding.  Aside from lower extremity swelling, he does not appear to be volume  overloaded.  PAF (paroxysmal atrial fibrillation) (HCC) Maintaining normal sinus rhythm.  He is not a candidate for anticoagulation secondary to recurrent GI bleeding.  Essential hypertension The patient's blood pressure is controlled on his current regimen.  Continue current therapy.   Chronic kidney disease, stage III (moderate) (HCC) Recent creatinine stable.  History of GI bleed Recent hemoglobin stable.   Dispo:  Return for [facility] Follow Up, w/ Dr. Acie Fredrickson.   Medication Adjustments/Labs and Tests Ordered: Current medicines are reviewed at length with the patient today.  Concerns regarding medicines are outlined above.  Tests Ordered: Orders Placed This Encounter  Procedures  . EKG 12-Lead   Medication Changes: No orders of the defined types were placed in this encounter.   Signed, Richardson Dopp, PA-C  07/29/2018 2:01 PM    [facility] [address], [place], [place]  [address] Phone: [phone]; Fax: [phone]

## 2018-07-29 NOTE — Telephone Encounter (Signed)
Okay to write letter excusing absence. Looks like [name] Elliott was done 11.11. She should please bring proof of jury duty date. Okay for CMA to write letter.

## 2018-07-29 NOTE — Telephone Encounter (Signed)
Okay to provide letter and okay to sign by CMA.

## 2018-07-29 NOTE — Discharge Instructions (Signed)

## 2018-07-29 NOTE — ED Triage Notes (Signed)
Pt reports left side breast pain and LUQ pain. Has been seen by his cardiologist who does not think this is cardiac. Sent over for further workup and possible CT scans.

## 2018-08-04 ENCOUNTER — Ambulatory Visit (INDEPENDENT_AMBULATORY_CARE_PROVIDER_SITE_OTHER): Payer: PPO | Admitting: Family Medicine

## 2018-08-04 ENCOUNTER — Encounter: Payer: Self-pay | Admitting: *Deleted

## 2018-08-04 ENCOUNTER — Ambulatory Visit (INDEPENDENT_AMBULATORY_CARE_PROVIDER_SITE_OTHER): Payer: PPO

## 2018-08-04 ENCOUNTER — Other Ambulatory Visit: Payer: Self-pay

## 2018-08-04 ENCOUNTER — Encounter: Payer: Self-pay | Admitting: Family Medicine

## 2018-08-04 VITALS — BP 125/70 | HR 80 | Temp 98.3°F | Ht 65.0 in | Wt 170.0 lb

## 2018-08-04 DIAGNOSIS — S4992XA Unspecified injury of left shoulder and upper arm, initial encounter: Secondary | ICD-10-CM | POA: Diagnosis not present

## 2018-08-04 DIAGNOSIS — R350 Frequency of micturition: Secondary | ICD-10-CM | POA: Diagnosis not present

## 2018-08-04 DIAGNOSIS — R0789 Other chest pain: Secondary | ICD-10-CM | POA: Diagnosis not present

## 2018-08-04 DIAGNOSIS — M25512 Pain in left shoulder: Secondary | ICD-10-CM

## 2018-08-04 DIAGNOSIS — S299XXA Unspecified injury of thorax, initial encounter: Secondary | ICD-10-CM | POA: Diagnosis not present

## 2018-08-04 DIAGNOSIS — R0781 Pleurodynia: Secondary | ICD-10-CM | POA: Diagnosis not present

## 2018-08-04 MED ORDER — DICLOFENAC SODIUM 1 % TD GEL: 2.0000 g | Freq: Four times a day (QID) | TRANSDERMAL | 0 refills | Status: DC

## 2018-08-04 MED ORDER — HYDROCODONE-ACETAMINOPHEN 5-325 MG PO TABS: 1.0000 | ORAL_TABLET | Freq: Four times a day (QID) | ORAL | 0 refills | Status: DC | PRN

## 2018-08-04 NOTE — Telephone Encounter (Signed)
Pt's daughter advised that letter for jury duty is ready for pickup. She stated she will come in today.

## 2018-08-04 NOTE — Patient Instructions (Addendum)
     If you have lab work done today you will be contacted with your lab results within the next 2 weeks.  If you have not heard from [place] then please contact us. The fastest way to get your results is to register for My Chart.   IF you received an x-ray today, you will receive an invoice from [facility]. Please contact [facility] at [phone] with questions or concerns regarding your invoice.   IF you received labwork today, you will receive an invoice from [facility]. Please contact [facility] at [phone] with questions or concerns regarding your invoice.   Our billing staff will not be able to assist you with questions regarding bills from these companies.  You will be contacted with the lab results as soon as they are available. The fastest way to get your results is to activate your My Chart account. Instructions are located on the last page of this paperwork. If you have not heard from [place] regarding the results in 2 weeks, please contact this office.     Chest Wall Pain Chest wall pain is pain in or around the bones and muscles of your chest. Sometimes, an injury causes this pain. Sometimes, the cause may not be known. This pain may take several weeks or longer to get better. Follow these instructions at home: Pay attention to any changes in your symptoms. Take these actions to help with your pain:  Rest as told by your health care provider.  Avoid activities that cause pain. These include any activities that use your chest muscles or your abdominal and side muscles to lift heavy items.  If directed, apply ice to the painful area: ? Put ice in a plastic bag. ? Place a towel between your skin and the bag. ? Leave the ice on for 20 minutes, 2-3 times per day.  Take over-the-counter and prescription medicines only as told by your health care provider.  Do not use tobacco products, including cigarettes, chewing tobacco, and e-cigarettes. If you need help  quitting, ask your health care provider.  Keep all follow-up visits as told by your health care provider. This is important.  Contact a health care provider if:  You have a fever.  Your chest pain becomes worse.  You have new symptoms. Get help right away if:  You have nausea or vomiting.  You feel sweaty or light-headed.  You have a cough with phlegm (sputum) or you cough up blood.  You develop shortness of breath. This information is not intended to replace advice given to you by your health care provider. Make sure you discuss any questions you have with your health care provider. Document Released: 08/20/2005 Document Revised: 12/29/2015 Document Reviewed: 11/15/2014 Elsevier Interactive Patient Education  [employer].

## 2018-08-04 NOTE — Progress Notes (Signed)
12/2/20191:33 PM  [name] Elliott [birth year], 84 y.o. male [id number]  Chief Complaint  Patient presents with  . Follow-up    upper abdominal hernia was the diagnosis in the ED, still having severe pain in the area and headaches. Taking tramodol for the pain that does not seem to be working    HPI:   Patient is a 84 y.o. male with past medical history significant for hypothyroidism, CAD, HTN, CHF, afib, DM2, osteoporosis, CKD3, dementia who presents today for ER followup  Seen in ER nov 26th for LUQ abd pain CT abd no acute process, T11 compression fracture Normal labs Thought it was a hernia, given tramadol and colace  Here with daughter who provides part of history  LUQ pain that hurts when he moves his left arm or if he coughs Hurts more when he tries to abduct his arm S/p Left shoulder replacement Pain worse after he tripped over his dog Wrapping is not helping Tramadol is not helping  He is also having increased headaches for past several weeks Before the fall He snores No vision changes No fever or chills  Daughter requesting for [place] to collect urinalysis as ordered by Dr Posey Pronto, nephrology for urinary frequency Patient stopped taking water pill due to this Having increased leg edema Denies any SOB or chest pain  Fall Risk  08/04/2018 08/04/2018 05/01/2018 02/07/2018 12/18/2017  Falls in the past year? 1 1 No No No  Number falls in past yr: 0 0 - - -  Injury with Fall? 1 - - - -     Depression screen [facility] 2/9 08/04/2018 05/01/2018 02/07/2018  Decreased Interest 0 0 0  Down, Depressed, Hopeless 0 0 0  PHQ - 2 Score 0 0 0  Some recent data might be hidden    Allergies  Allergen Reactions  . Crestor [Rosuvastatin Calcium] Other (See Comments)    Muscle pain/aches  . Mestinon [Pyridostigmine Bromide Er] Other (See Comments)    GI upset, stomach cramps    Prior to Admission medications   Medication Sig Start Date End Date Taking? Authorizing Provider  aspirin EC  81 MG tablet Take 81 mg by mouth daily.    Yes [provider]  bismuth subsalicylate (PEPTO BISMOL) 262 MG/15ML suspension Take 30 mLs by mouth daily as needed for indigestion or diarrhea or loose stools.    Yes [provider]  docusate sodium (COLACE) 100 MG capsule Take 1 capsule (100 mg total) by mouth every 12 (twelve) hours. 07/29/18  Yes Harris, Abigail, PA-C  furosemide (LASIX) 40 MG tablet Take 1 tablet (40 mg total) by mouth daily. 07/03/18  Yes Nahser, Wonda Cheng, MD  gabapentin (NEURONTIN) 300 MG capsule Take 2 capsules (600 mg) by mouth three times a day 07/29/18  Yes McVey, Gelene Mink, PA-C  isosorbide mononitrate (IMDUR) 30 MG 24 hr tablet Take 2 tablets (60 mg total) by mouth daily. 07/27/18  Yes Dorie Rank, MD  levothyroxine (SYNTHROID, LEVOTHROID) 50 MCG tablet Take 1 tablet (50 mcg total) by mouth daily. 02/07/18  Yes McVey, Gelene Mink, PA-C  magnesium hydroxide (MILK OF MAGNESIA) 400 MG/5ML suspension Take 15 mLs by mouth daily as needed for mild constipation or moderate constipation.   Yes [provider]  nitroGLYCERIN (NITROSTAT) 0.4 MG SL tablet Place 1 tablet (0.4 mg total) under the tongue every 5 (five) minutes as needed for chest pain. 07/03/18  Yes Nahser, Wonda Cheng, MD  omeprazole (PRILOSEC) 20 MG capsule Take 1 capsule (20 mg  total) by mouth 2 (two) times daily before a meal. 07/01/18  Yes McVey, Gelene Mink, PA-C  potassium chloride SA (K-DUR,KLOR-CON) 20 MEQ tablet Take 1 tablet (20 mEq total) by mouth 2 (two) times daily. 06/10/18  Yes McVey, Gelene Mink, PA-C  traMADol (ULTRAM) 50 MG tablet Take 1 tablet (50 mg total) by mouth every 6 (six) hours as needed. 07/29/18  Yes Margarita Mail, PA-C    Past Medical History:  Diagnosis Date  . Arthritis   . Arthropathy, unspecified, site unspecified   . Atrial fibrillation ([place])   . Blood dyscrasia    thromboctopenia pt family states he was never told of this  . CAD  (coronary artery disease)    last cath in 2012. Managed medically-some blockages  . Chronic lower back pain   . Chronic systolic CHF (congestive heart failure) ([place]) 06/04/2016  . Constipation, chronic   . Diabetes mellitus without complication ([place])    pt states he doesn't have  . DVT (deep venous thrombosis) ([place])   . Dysrhythmia   . Esophageal reflux   . 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 MRI Nov '11 - tendonosis and partial tear. Left shoulder surgery Jan '12 for partial shoulder replacement. [name])   . Headache(784.0)   . History of stomach ulcers 11/2012  . Hypothyroidism    pt states he doeen't have   . Orthostasis   . Other B-complex deficiencies   . Other malaise and fatigue   . Pain in joint, shoulder region    Has chronic shoulder pain  . Pain in limb   . Persistent disorder of initiating or maintaining sleep   . Pneumonia 1980's?; 2011  . Primary localized osteoarthritis of left knee 07/12/2015  . Pure hypercholesterolemia   . PVC's (premature ventricular contractions)   . S/P cardiac cath 08/08/11   mild to moderate CAD primarily in the LAD. None are obstructive and appear stable from prior cath in 2007; managed medically  . Sebaceous cyst   . Stroke ([place])   . Thrombocytopenia, unspecified ([place])   . Unspecified essential hypertension   . Vascular dementia without behavioral disturbance (HCC)    with periods of amnesia    Past Surgical History:  Procedure Laterality Date  . BACK SURGERY    . CARDIAC CATHETERIZATION  08/2011  . CATARACT EXTRACTION Left 2009  . COLONOSCOPY    . ESOPHAGOGASTRODUODENOSCOPY N/A 11/19/2012   Procedure: ESOPHAGOGASTRODUODENOSCOPY (EGD);  Surgeon: Jerene Bears, MD;  Location: [place];  Service: Gastroenterology;  Laterality: N/A;  . FINGER AMPUTATION Right    pinky finger  . HAND SURGERY Right    crush injury, right fifth digit contracture, limited  motion  . HARDWARE REMOVAL  02/05/2012    Procedure: HARDWARE REMOVAL;  Surgeon: Johnny Bridge, MD;  Location: [place];  Service: Orthopedics;  Laterality: Left;  . KNEE SURGERY Left 1991   "did it twice in 1 wk" (02/17/2013)  . LUMBAR SPINE SURGERY  2007   Dr Philip Aspen ([place])  . REVERSE SHOULDER ARTHROPLASTY  02/05/2012   Procedure: REVERSE SHOULDER ARTHROPLASTY;  Surgeon: Johnny Bridge, MD;  Location: [place];  Service: Orthopedics;  Laterality: Left;  . SHOULDER HEMI-ARTHROPLASTY    . SHOULDER OPEN ROTATOR CUFF REPAIR Left 8.30.2011  . TOTAL KNEE ARTHROPLASTY Left 07/12/2015   Procedure: TOTAL KNEE ARTHROPLASTY;  Surgeon: Marchia Bond, MD;  Location: [place];  Service: Orthopedics;  Laterality: Left;  . US ECHOCARDIOGRAPHY  02-28-2010   Est EF 50-55%  Social History   Tobacco Use  . Smoking status: Former Smoker    Types: Cigars    Last attempt to quit: 09/03/1978    Years since quitting: 39.9  . Smokeless tobacco: Current User    Types: Chew  . Tobacco comment: 02/17/2013 "I aien't smoked a cigar in 20-30 yr"  Substance Use Topics  . Alcohol use: No    Alcohol/week: 0.0 standard drinks    Family History  Problem Relation Age of Onset  . Breast cancer Mother   . Cancer Mother   . Diabetes Mother   . Cancer Brother        throat  . Stroke Brother   . Kidney disease Father   . Diabetes Sister   . Diabetes Daughter   . Colon cancer Neg Hx   . Heart attack Neg Hx     ROS Per hpi  OBJECTIVE:  Blood pressure 125/70, pulse 80, temperature 98.3 F (36.8 C), temperature source Oral, height 5\' 5"  (1.651 m), weight 170 lb (77.1 kg), SpO2 98 %. Body mass index is 28.29 kg/m.   Physical Exam  Constitutional: He is oriented to person, place, and time. He appears well-developed and well-nourished.  HENT:  Head: Normocephalic and atraumatic.  Mouth/Throat: Oropharynx is clear and moist.  Eyes: Pupils are equal, round, and reactive to light. Conjunctivae and EOM are normal.  Neck: Neck supple.  Cardiovascular:  Normal rate and regular rhythm. Exam reveals no gallop and no friction rub.  No murmur heard. Pulmonary/Chest: Effort normal and breath sounds normal. He has no wheezes. He has no rales. He exhibits tenderness (along lower ribs, no deformity or crepitus noted).  Abdominal: Soft. Bowel sounds are normal. He exhibits no distension and no mass. There is no tenderness. No hernia.  Musculoskeletal: He exhibits edema.       Left shoulder: He exhibits decreased range of motion. He exhibits no bony tenderness and no swelling.  Neurological: He is alert and oriented to person, place, and time.  Skin: Skin is warm and dry.  Psychiatric: He has a normal mood and affect.  Nursing note and vitals reviewed.  Dg Ribs Unilateral W/chest Left  Result Date: 08/04/2018 CLINICAL DATA:  Left rib pain due to a fall today. Initial encounter. EXAM: LEFT RIBS AND CHEST - 3+ VIEW COMPARISON:  Single-view of the chest 07/27/2018. PA and lateral chest 12/12/2017. FINDINGS: There is mild left basilar atelectasis. Lungs otherwise clear. No pneumothorax or pleural effusion. Heart size is upper normal. Aortic atherosclerosis is noted. No fracture is identified. IMPRESSION: No acute abnormality.  Negative for fracture. Atherosclerosis. Electronically Signed   By: Inge Rise M.D.   On: 08/04/2018 14:23   Dg Shoulder Left  Result Date: 08/04/2018 CLINICAL DATA:  Pain after fall EXAM: LEFT SHOULDER - 2+ VIEW COMPARISON:  July 27, 2018 FINDINGS: The patient is status post left shoulder replacement. No dislocation or fracture noted. IMPRESSION: No acute abnormality. Electronically Signed   By: Dorise Bullion III M.D   On: 08/04/2018 14:23     ASSESSMENT and PLAN  1. Left-sided chest wall pain Discussed supportive measures, new meds r/se/b and RTC precautions. Patient educational handout given. - DG Ribs Unilateral W/Chest Left; Future  2. Acute pain of left shoulder - DG Shoulder Left; Future  3. Urinary  frequency - Urinalysis, Routine w reflex microscopic  Other orders - diclofenac sodium (VOLTAREN) 1 % GEL; Apply 2 g topically 4 (four) times daily. - HYDROcodone-acetaminophen (NORCO/VICODIN) 5-325 MG tablet; Take  1 tablet by mouth every 6 (six) hours as needed for moderate pain.  No follow-ups on file.    Rutherford Guys, MD Primary Care at [facility], [place] [address] Ph.  [phone] Fax [phone]

## 2018-08-05 LAB — URINALYSIS, ROUTINE W REFLEX MICROSCOPIC
Bilirubin, UA: NEGATIVE
Glucose, UA: NEGATIVE
Ketones, UA: NEGATIVE
Leukocytes, UA: NEGATIVE
Nitrite, UA: NEGATIVE
Protein, UA: NEGATIVE
RBC, UA: NEGATIVE
Specific Gravity, UA: 1.019 (ref 1.005–1.030)
Urobilinogen, Ur: 1 mg/dL (ref 0.2–1.0)
pH, UA: 5.5 (ref 5.0–7.5)

## 2018-08-20 ENCOUNTER — Ambulatory Visit: Payer: PPO | Admitting: Family Medicine

## 2018-09-04 ENCOUNTER — Other Ambulatory Visit: Payer: Self-pay

## 2018-09-04 ENCOUNTER — Encounter: Payer: Self-pay | Admitting: Family Medicine

## 2018-09-04 ENCOUNTER — Ambulatory Visit (INDEPENDENT_AMBULATORY_CARE_PROVIDER_SITE_OTHER): Payer: PPO | Admitting: Family Medicine

## 2018-09-04 VITALS — BP 134/64 | HR 64 | Temp 97.8°F | Resp 18 | Ht 65.0 in | Wt 163.4 lb

## 2018-09-04 DIAGNOSIS — R0789 Other chest pain: Secondary | ICD-10-CM | POA: Diagnosis not present

## 2018-09-04 DIAGNOSIS — B351 Tinea unguium: Secondary | ICD-10-CM | POA: Diagnosis not present

## 2018-09-04 MED ORDER — LIDOCAINE 5 % EX PTCH: 1.0000 | MEDICATED_PATCH | CUTANEOUS | 0 refills | Status: DC

## 2018-09-04 NOTE — Patient Instructions (Signed)
° ° ° °  If you have lab work done today you will be contacted with your lab results within the next 2 weeks.  If you have not heard from us then please contact us. The fastest way to get your results is to register for My Chart. ° ° °IF you received an x-ray today, you will receive an invoice from [facility]. Please contact [facility] at [phone] with questions or concerns regarding your invoice.  ° °IF you received labwork today, you will receive an invoice from [facility]. Please contact [facility] at [phone] with questions or concerns regarding your invoice.  ° °Our billing staff will not be able to assist you with questions regarding bills from these companies. ° °You will be contacted with the lab results as soon as they are available. The fastest way to get your results is to activate your My Chart account. Instructions are located on the last page of this paperwork. If you have not heard from us regarding the results in 2 weeks, please contact this office. °  ° ° ° °

## 2018-09-04 NOTE — Progress Notes (Signed)
1/2/202011:34 AM  [name] Elliott [birth year], 85 y.o. male [id number]  Chief Complaint  Patient presents with  . Hospitalization Follow-up    chest pain     HPI:   Patient is a 85 y.o. male with past medical history significant for hypothyroidism, CAD, HTN, CHF, afib, DM2, osteoporosis, CKD3, dementia who presents today for routine follouwp  He has not been in the hosp or ER since our last visit Continues to have lower chest pain and swelling Hurts when he lifts his arm  Mammogram shows gynecomastia  Diclofenac gel not helping Has not been applying heat Wearing an ace wrap helps  Requesting referral to podiatry, has very thick toenails that are very hard to cut  Here with this daughter  Fall Risk  09/04/2018 08/04/2018 08/04/2018 05/01/2018 02/07/2018  Falls in the past year? 1 1 1  No No  Number falls in past yr: 0 0 0 - -  Injury with Fall? 0 1 - - -     Depression screen [facility] 2/9 09/04/2018 08/04/2018 05/01/2018  Decreased Interest 0 0 0  Down, Depressed, Hopeless 0 0 0  PHQ - 2 Score 0 0 0  Some recent data might be hidden    Allergies  Allergen Reactions  . Crestor [Rosuvastatin Calcium] Other (See Comments)    Muscle pain/aches  . Mestinon [Pyridostigmine Bromide Er] Other (See Comments)    GI upset, stomach cramps    Prior to Admission medications   Medication Sig Start Date End Date Taking? Authorizing Provider  aspirin EC 81 MG tablet Take 81 mg by mouth daily.    Yes [provider]  bismuth subsalicylate (PEPTO BISMOL) 262 MG/15ML suspension Take 30 mLs by mouth daily as needed for indigestion or diarrhea or loose stools.    Yes [provider]  diclofenac sodium (VOLTAREN) 1 % GEL Apply 2 g topically 4 (four) times daily. 08/04/18  Yes Rutherford Guys, MD  docusate sodium (COLACE) 100 MG capsule Take 1 capsule (100 mg total) by mouth every 12 (twelve) hours. 07/29/18  Yes Harris, Abigail, PA-C  furosemide (LASIX) 40 MG tablet Take 1 tablet (40  mg total) by mouth daily. 07/03/18  Yes Nahser, Wonda Cheng, MD  gabapentin (NEURONTIN) 300 MG capsule Take 2 capsules (600 mg) by mouth three times a day 07/29/18  Yes McVey, Gelene Mink, PA-C  HYDROcodone-acetaminophen (NORCO/VICODIN) 5-325 MG tablet Take 1 tablet by mouth every 6 (six) hours as needed for moderate pain. 08/04/18  Yes Rutherford Guys, MD  isosorbide mononitrate (IMDUR) 30 MG 24 hr tablet Take 2 tablets (60 mg total) by mouth daily. 07/27/18  Yes Dorie Rank, MD  levothyroxine (SYNTHROID, LEVOTHROID) 50 MCG tablet Take 1 tablet (50 mcg total) by mouth daily. 02/07/18  Yes McVey, Gelene Mink, PA-C  magnesium hydroxide (MILK OF MAGNESIA) 400 MG/5ML suspension Take 15 mLs by mouth daily as needed for mild constipation or moderate constipation.   Yes [provider]  nitroGLYCERIN (NITROSTAT) 0.4 MG SL tablet Place 1 tablet (0.4 mg total) under the tongue every 5 (five) minutes as needed for chest pain. 07/03/18  Yes Nahser, Wonda Cheng, MD  omeprazole (PRILOSEC) 20 MG capsule Take 1 capsule (20 mg total) by mouth 2 (two) times daily before a meal. 07/01/18  Yes McVey, Gelene Mink, PA-C  potassium chloride SA (K-DUR,KLOR-CON) 20 MEQ tablet Take 1 tablet (20 mEq total) by mouth 2 (two) times daily. 06/10/18  Yes McVey, Gelene Mink, PA-C    Past Medical History:  Diagnosis Date  . Arthritis   . Arthropathy, unspecified, site unspecified   . Atrial fibrillation ([place])   . Blood dyscrasia    thromboctopenia pt family states he was never told of this  . CAD (coronary artery disease)    last cath in 2012. Managed medically-some blockages  . Chronic lower back pain   . Chronic systolic CHF (congestive heart failure) ([place]) 06/04/2016  . Constipation, chronic   . Diabetes mellitus without complication ([place])    pt states he doesn't have  . DVT (deep venous thrombosis) ([place])   . Dysrhythmia   . Esophageal reflux   . 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 MRI Nov '11 - tendonosis and partial tear. Left shoulder surgery Jan '12 for partial shoulder replacement. [name])   . Headache(784.0)   . History of stomach ulcers 11/2012  . Hypothyroidism    pt states he doeen't have   . Orthostasis   . Other B-complex deficiencies   . Other malaise and fatigue   . Pain in joint, shoulder region    Has chronic shoulder pain  . Pain in limb   . Persistent disorder of initiating or maintaining sleep   . Pneumonia 1980's?; 2011  . Primary localized osteoarthritis of left knee 07/12/2015  . Pure hypercholesterolemia   . PVC's (premature ventricular contractions)   . S/P cardiac cath 08/08/11   mild to moderate CAD primarily in the LAD. None are obstructive and appear stable from prior cath in 2007; managed medically  . Sebaceous cyst   . Stroke ([place])   . Thrombocytopenia, unspecified ([place])   . Unspecified essential hypertension   . Vascular dementia without behavioral disturbance (HCC)    with periods of amnesia    Past Surgical History:  Procedure Laterality Date  . BACK SURGERY    . CARDIAC CATHETERIZATION  08/2011  . CATARACT EXTRACTION Left 2009  . COLONOSCOPY    . ESOPHAGOGASTRODUODENOSCOPY N/A 11/19/2012   Procedure: ESOPHAGOGASTRODUODENOSCOPY (EGD);  Surgeon: Jerene Bears, MD;  Location: [place];  Service: Gastroenterology;  Laterality: N/A;  . FINGER AMPUTATION Right    pinky finger  . HAND SURGERY Right    crush injury, right fifth digit contracture, limited  motion  . HARDWARE REMOVAL  02/05/2012   Procedure: HARDWARE REMOVAL;  Surgeon: Johnny Bridge, MD;  Location: [place];  Service: Orthopedics;  Laterality: Left;  . KNEE SURGERY Left 1991   "did it twice in 1 wk" (02/17/2013)  . LUMBAR SPINE SURGERY  2007   Dr Philip Aspen ([place])  . REVERSE SHOULDER ARTHROPLASTY  02/05/2012   Procedure: REVERSE SHOULDER ARTHROPLASTY;  Surgeon: Johnny Bridge, MD;  Location: [place];  Service: Orthopedics;  Laterality:  Left;  . SHOULDER HEMI-ARTHROPLASTY    . SHOULDER OPEN ROTATOR CUFF REPAIR Left 8.30.2011  . TOTAL KNEE ARTHROPLASTY Left 07/12/2015   Procedure: TOTAL KNEE ARTHROPLASTY;  Surgeon: Marchia Bond, MD;  Location: [place];  Service: Orthopedics;  Laterality: Left;  . US ECHOCARDIOGRAPHY  02-28-2010   Est EF 50-55%    Social History   Tobacco Use  . Smoking status: Former Smoker    Types: Cigars    Last attempt to quit: 09/03/1978    Years since quitting: 40.0  . Smokeless tobacco: Current User    Types: Chew  . Tobacco comment: 02/17/2013 "I aien't smoked a cigar in 20-30 yr"  Substance Use Topics  . Alcohol use: No    Alcohol/week: 0.0 standard drinks  Family History  Problem Relation Age of Onset  . Breast cancer Mother   . Cancer Mother   . Diabetes Mother   . Cancer Brother        throat  . Stroke Brother   . Kidney disease Father   . Diabetes Sister   . Diabetes Daughter   . Colon cancer Neg Hx   . Heart attack Neg Hx     ROS Per hpi  OBJECTIVE:  Blood pressure 134/64, pulse 64, temperature 97.8 F (36.6 C), temperature source Oral, resp. rate 18, height 5\' 5"  (1.651 m), weight 163 lb 6.4 oz (74.1 kg), SpO2 96 %. Body mass index is 27.19 kg/m.   Physical Exam Vitals signs and nursing note reviewed.  Constitutional:      Appearance: He is well-developed.  HENT:     Head: Normocephalic and atraumatic.  Eyes:     Conjunctiva/sclera: Conjunctivae normal.     Pupils: Pupils are equal, round, and reactive to light.  Neck:     Musculoskeletal: Neck supple.  Cardiovascular:     Rate and Rhythm: Normal rate and regular rhythm.     Heart sounds: No murmur. No friction rub. No gallop.   Pulmonary:     Effort: Pulmonary effort is normal.     Breath sounds: Normal breath sounds. No wheezing or rales.  Chest:     Chest wall: Tenderness (along lower ribs, no deformity or crepitus noted) present.  Abdominal:     General: Bowel sounds are normal. There is no  distension.     Palpations: Abdomen is soft. There is no mass.     Tenderness: There is no abdominal tenderness.     Hernia: No hernia is present.  Skin:    General: Skin is warm and dry.  Neurological:     Mental Status: He is alert and oriented to person, place, and time.      ASSESSMENT and PLAN  1. Left-sided chest wall pain Discuss cont with supportive measures. Adding lidocaine patch for pain control. Consider referral to PMR - lidocaine (LIDODERM) 5 %; Place 1 patch onto the skin daily. Remove & Discard patch within 12 hours or as directed by MD  2. Onychomycosis - Ambulatory referral to Podiatry  Other orders   Return in about 3 months (around 12/04/2018).    Rutherford Guys, MD Primary Care at [facility], [place] [address] Ph.  [phone] Fax [phone]

## 2018-09-05 ENCOUNTER — Telehealth: Payer: Self-pay | Admitting: Family Medicine

## 2018-09-05 NOTE — Telephone Encounter (Signed)
He has failed voltaren gel I had already discussed that it might not be covered and to try OTC patches No PA will be obtained

## 2018-09-05 NOTE — Telephone Encounter (Signed)
Spoke with insurance company to initiate a PA for lidocaine patches and they said that it will not be approved unless the patient has diabetic neuropathy, shingles, or neuropathy related to cancer. She states that Voltaren would be approved.

## 2018-09-10 ENCOUNTER — Telehealth: Payer: Self-pay | Admitting: Physician Assistant

## 2018-09-10 ENCOUNTER — Other Ambulatory Visit: Payer: Self-pay | Admitting: Physician Assistant

## 2018-09-10 DIAGNOSIS — Z8639 Personal history of other endocrine, nutritional and metabolic disease: Secondary | ICD-10-CM

## 2018-09-10 DIAGNOSIS — E079 Disorder of thyroid, unspecified: Secondary | ICD-10-CM

## 2018-09-10 MED ORDER — POTASSIUM CHLORIDE CRYS ER 20 MEQ PO TBCR: 20.0000 meq | EXTENDED_RELEASE_TABLET | Freq: Two times a day (BID) | ORAL | 0 refills | Status: DC

## 2018-09-10 MED ORDER — LEVOTHYROXINE SODIUM 50 MCG PO TABS: 50.0000 ug | ORAL_TABLET | Freq: Every day | ORAL | 0 refills | Status: DC

## 2018-09-10 NOTE — Telephone Encounter (Signed)
Copied from [place] [id number]. Topic: Quick Communication - See Telephone Encounter >> Sep 10, 2018  3:49 PM Rutherford Nail, NT wrote: CRM for notification. See Telephone encounter for: 09/10/18. Daughter calling and states that they are needing a prior authorization done for the lidocaine (LIDODERM) 5 %. Please advise.

## 2018-09-10 NOTE — Telephone Encounter (Signed)
Copied from [place] [id number]. Topic: Quick Communication - Rx Refill/Question >> Sep 10, 2018  3:46 PM Selinda Flavin B, NT wrote: Medication: levothyroxine (SYNTHROID, LEVOTHROID) 50 MCG tablet  potassium chloride SA (K-DUR,KLOR-CON) 20 MEQ tablet   Has the patient contacted their pharmacy? Yes.  To call the office because he switched pharmacies. (Agent: If no, request that the patient contact the pharmacy for the refill.) (Agent: If yes, when and what did the pharmacy advise?)  Preferred Pharmacy (with phone number or street name): [facility] [place], [place]  Agent: Please be advised that RX refills may take up to 3 business days. We ask that you follow-up with your pharmacy.

## 2018-09-11 NOTE — Telephone Encounter (Signed)
LVM for pt daughter to give the office back a call about PA.

## 2018-09-11 NOTE — Telephone Encounter (Signed)
Spoke with daughter and she states she will have the pharmacy send over the PA.

## 2018-09-18 ENCOUNTER — Encounter: Payer: Self-pay | Admitting: Podiatry

## 2018-09-18 ENCOUNTER — Ambulatory Visit (INDEPENDENT_AMBULATORY_CARE_PROVIDER_SITE_OTHER): Payer: PPO | Admitting: Podiatry

## 2018-09-18 VITALS — BP 108/48

## 2018-09-18 DIAGNOSIS — M79675 Pain in left toe(s): Secondary | ICD-10-CM

## 2018-09-18 DIAGNOSIS — E119 Type 2 diabetes mellitus without complications: Secondary | ICD-10-CM

## 2018-09-18 DIAGNOSIS — G609 Hereditary and idiopathic neuropathy, unspecified: Secondary | ICD-10-CM

## 2018-09-18 DIAGNOSIS — M79674 Pain in right toe(s): Secondary | ICD-10-CM

## 2018-09-18 DIAGNOSIS — B351 Tinea unguium: Secondary | ICD-10-CM

## 2018-09-18 NOTE — Patient Instructions (Signed)
Diabetes Mellitus and Foot Care  Foot care is an important part of your health, especially when you have diabetes. Diabetes may cause you to have problems because of poor blood flow (circulation) to your feet and legs, which can cause your skin to:   Become thinner and drier.   Break more easily.   Heal more slowly.   Peel and crack.  You may also have nerve damage (neuropathy) in your legs and feet, causing decreased feeling in them. This means that you may not notice minor injuries to your feet that could lead to more serious problems. Noticing and addressing any potential problems early is the best way to prevent future foot problems.  How to care for your feet  Foot hygiene   Wash your feet daily with warm water and mild soap. Do not use hot water. Then, pat your feet and the areas between your toes until they are completely dry. Do not soak your feet as this can dry your skin.   Trim your toenails straight across. Do not dig under them or around the cuticle. File the edges of your nails with an emery board or nail file.   Apply a moisturizing lotion or petroleum jelly to the skin on your feet and to dry, brittle toenails. Use lotion that does not contain alcohol and is unscented. Do not apply lotion between your toes.  Shoes and socks   Wear clean socks or stockings every day. Make sure they are not too tight. Do not wear knee-high stockings since they may decrease blood flow to your legs.   Wear shoes that fit properly and have enough cushioning. Always look in your shoes before you put them on to be sure there are no objects inside.   To break in new shoes, wear them for just a few hours a day. This prevents injuries on your feet.  Wounds, scrapes, corns, and calluses   Check your feet daily for blisters, cuts, bruises, sores, and redness. If you cannot see the bottom of your feet, use a mirror or ask someone for help.   Do not cut corns or calluses or try to remove them with medicine.   If you  find a minor scrape, cut, or break in the skin on your feet, keep it and the skin around it clean and dry. You may clean these areas with mild soap and water. Do not clean the area with peroxide, alcohol, or iodine.   If you have a wound, scrape, corn, or callus on your foot, look at it several times a day to make sure it is healing and not infected. Check for:  ? Redness, swelling, or pain.  ? Fluid or blood.  ? Warmth.  ? Pus or a bad smell.  General instructions   Do not cross your legs. This may decrease blood flow to your feet.   Do not use heating pads or hot water bottles on your feet. They may burn your skin. If you have lost feeling in your feet or legs, you may not know this is happening until it is too late.   Protect your feet from hot and cold by wearing shoes, such as at the beach or on hot pavement.   Schedule a complete foot exam at least once a year (annually) or more often if you have foot problems. If you have foot problems, report any cuts, sores, or bruises to your health care provider immediately.  Contact a health care provider if:     You have a medical condition that increases your risk of infection and you have any cuts, sores, or bruises on your feet.   You have an injury that is not healing.   You have redness on your legs or feet.   You feel burning or tingling in your legs or feet.   You have pain or cramps in your legs and feet.   Your legs or feet are numb.   Your feet always feel cold.   You have pain around a toenail.  Get help right away if:   You have a wound, scrape, corn, or callus on your foot and:  ? You have pain, swelling, or redness that gets worse.  ? You have fluid or blood coming from the wound, scrape, corn, or callus.  ? Your wound, scrape, corn, or callus feels warm to the touch.  ? You have pus or a bad smell coming from the wound, scrape, corn, or callus.  ? You have a fever.  ? You have a red line going up your leg.  Summary   Check your feet every day  for cuts, sores, red spots, swelling, and blisters.   Moisturize feet and legs daily.   Wear shoes that fit properly and have enough cushioning.   If you have foot problems, report any cuts, sores, or bruises to your health care provider immediately.   Schedule a complete foot exam at least once a year (annually) or more often if you have foot problems.  This information is not intended to replace advice given to you by your health care provider. Make sure you discuss any questions you have with your health care provider.  Document Released: 08/17/2000 Document Revised: 10/02/2017 Document Reviewed: 09/21/2016  Elsevier Interactive Patient Education  2019 [employer]

## 2018-10-02 ENCOUNTER — Ambulatory Visit: Payer: PPO | Admitting: Cardiovascular Disease

## 2018-10-02 ENCOUNTER — Encounter: Payer: Self-pay | Admitting: Cardiovascular Disease

## 2018-10-02 ENCOUNTER — Encounter: Payer: Self-pay | Admitting: Nurse Practitioner

## 2018-10-02 VITALS — BP 120/70 | HR 62 | Ht 65.0 in | Wt 163.1 lb

## 2018-10-02 DIAGNOSIS — I2510 Atherosclerotic heart disease of native coronary artery without angina pectoris: Secondary | ICD-10-CM | POA: Diagnosis not present

## 2018-10-02 DIAGNOSIS — I5042 Chronic combined systolic (congestive) and diastolic (congestive) heart failure: Secondary | ICD-10-CM

## 2018-10-02 LAB — BASIC METABOLIC PANEL
BUN/Creatinine Ratio: 16 (ref 10–24)
BUN: 26 mg/dL (ref 8–27)
CO2: 21 mmol/L (ref 20–29)
Calcium: 9.6 mg/dL (ref 8.6–10.2)
Chloride: 97 mmol/L (ref 96–106)
Creatinine, Ser: 1.58 mg/dL — ABNORMAL HIGH (ref 0.76–1.27)
GFR calc Af Amer: 44 mL/min/{1.73_m2} — ABNORMAL LOW (ref >59)
GFR calc non Af Amer: 38 mL/min/{1.73_m2} — ABNORMAL LOW (ref >59)
Glucose: 105 mg/dL — ABNORMAL HIGH (ref 65–99)
Potassium: 4.6 mmol/L (ref 3.5–5.2)
Sodium: 134 mmol/L (ref 134–144)

## 2018-10-02 LAB — LIPID PANEL
Chol/HDL Ratio: 3.3 ratio (ref 0.0–5.0)
Cholesterol, Total: 181 mg/dL (ref 100–199)
HDL: 55 mg/dL (ref >39)
LDL Calculated: 111 mg/dL — ABNORMAL HIGH (ref 0–99)
Triglycerides: 73 mg/dL (ref 0–149)
VLDL Cholesterol Cal: 15 mg/dL (ref 5–40)

## 2018-10-02 LAB — HEPATIC FUNCTION PANEL
ALBUMIN: 4.1 g/dL (ref 3.6–4.6)
ALT: 12 IU/L (ref 0–44)
AST: 20 IU/L (ref 0–40)
Alkaline Phosphatase: 65 IU/L (ref 39–117)
BILIRUBIN TOTAL: 0.7 mg/dL (ref 0.0–1.2)
Bilirubin, Direct: 0.35 mg/dL (ref 0.00–0.40)
Total Protein: 7.1 g/dL (ref 6.0–8.5)

## 2018-10-02 MED ORDER — ISOSORBIDE MONONITRATE ER 30 MG PO TB24: 30.0000 mg | ORAL_TABLET | Freq: Every day | ORAL | 3 refills | Status: AC

## 2018-10-02 NOTE — Progress Notes (Signed)
Cardiology Office Note   Date:  10/02/2018   ID:  [name] Elliott, DOB December 28, 1927, MRN [id number]  PCP:  Rutherford Guys, MD  Cardiologist:   Mertie Moores, MD   Chief Complaint  Patient presents with  . Coronary Artery Disease   Problem list:  1. Moderate coronary artery disease 2. Dementia 3. Peripheral vascular disease 4. CVA 5.  Paroxysmal atrial fib      85 year old gentleman with a history of moderate diffuse coronary artery disease. We treated medically. The left anterior descending arteries/ 1st diagonal stenosis has not was not suitable for PCI.  He complains of being sick in general is weak sensation. He also complains of some left arm pain. When I saw him last month. He was having some episodes of chest pain. We were trying to decide whether or not these were due to angina. I asked him to take nitroglycerin when he had these episodes of pain. At times the nitroglycerin helps him and at other times the nitroglycerin did not do anything. When he walks up a hill in his backyard he has significant shortness breath and this "sick feeling".  He has had a partial shoulder replacement for a shoulder fracture. He has had chronic shoulder pain since that time.  He has chronic knee pain and is considering knee surgery. His office visit was for the purpose of preoperative evaluation.  Sept. 17, 2013- He has no cardiac complaints. He denies any chest pain or dyspnea. He still has some left shoulder stiffness from his surgery February 05, 2012. He has had some wheezing and was recently started on steroids and an antibiotic.  He also ws diagnosed with peripheral neurophy He was prescribed gabapentin but his daughter did not fill the medication because it was too expensive.  She bought some over-the-counter vitamin B12 which seems to be helping a little bit.  November 26, 2012:  [name] Elliott was recently hospitalized last week for peptic ulcer Disease. He had vomiting of  coffee-ground emesis. He also had blood in his stool. [facility] discontinued his Gabriel Earing powders and his Fosamax. He seems to be doing well from a cardiac standpoint. No angina.  He is having trouble with leg pain.   Jan. 20, 2015:  No CP. He has lots of fatigue especially if he tries to walk any distance.  April 21, 2014,   November 02, 2014;  [name] Elliott is a 85 y.o. male who presents for his coronary artery disease.   He was seen by Richardson Dopp several weeks ago. He had a stroke back in November. We've placed a event monitor looking for atrial fib . There was no evidence of atrial fibrillation on the monitor. There is some mention of atrial fibrillation in past charts but we've not been able to locate any confirmation of that.  He complains of leg pain today.   He keeps his legs elevated 3-4 hours a day.  Has seen [facility] and they had no solutions for his leg pain.    No additional stroke symptoms.  He was seen with his granddaughter this am.  Aug. 30, 2016:  He has been diagnosed with DVTs.  Has been on xarelto. Needs to have knee  Replacement .   Feb. 28, 2017: Still having lot of left leg swelling from his left TKA . No CP . Breathing is ok Unable to walk very far at all   Oct. 2, 2017:  Is having some shortness of breath  - especially with any  exertion  Last echo was 08/02/14 and showed LVEF of 45-50%. Grade 1 DD.  Mild - mod MR  Eats lots of salty foods according to family Has some leg swelling   Jan 01, 2017:  Arland is seen today for follow up visit atrial fib, cad, and chronic combined CHF  Has been having lots of dizziness and shortness of breath recently .   Was seen at [facility] He gets dizzy,  Has profound leg weakness.  At times he has orthostatic hypotension   Has headaches.   Reported some chest pain last week that worsened when he moved his arm    04/30/2017:  [name] is seen today for follow-up visit. He does not have any cardiac complaints.  He complains of feeling very weak and not having any energy. His blood pressure has been low and he has had orthostatic hypotension in the past. He has already stopped taking isosorbide mononitrate.  December 24, 2017:  Seen with [name] Elliott ( daughter)  Mr. Hopfensperger is seen back today for follow-up of his coronary artery disease.  He has a history of paroxysmal atrial fibrillation.  He is been on Xarelto 15 mg a day.  He recently was seen in the emergency room with a leg injury.   Had some leg swellnig - a vericose vein ruptured.  Hb dropped from 13 to 10 .   Also had a severe nose bleed in March   Is still eating a very high salt diet   Has seen [facility] for his orthostatic hypotension ( feels " drunk " ) Tried mestinon 60 mg TID but did not tolerate it .   Oct. 31, 2019: [name] Elliott has had some chest pain Has some swelling and a mass behind his left nipple  The CP seems to be below the the mass.    Jan. 30, 2020:  [name] Elliott is seen back today  Overall, seems to be doing well but he never feels great .  Has leg weakness and balance  Eats 3 hot dogs for breakfast, Eats chicken noodle soup or canned beef stew for lunch Eats banquet dinner for dinner .     Past Medical History:  Diagnosis Date  . Arthritis   . Arthropathy, unspecified, site unspecified   . Atrial fibrillation ([place])   . Blood dyscrasia    thromboctopenia pt family states he was never told of this  . CAD (coronary artery disease)    last cath in 2012. Managed medically-some blockages  . Chronic lower back pain   . Chronic systolic CHF (congestive heart failure) ([place]) 06/04/2016  . Constipation, chronic   . Diabetes mellitus without complication ([place])    pt states he doesn't have  . DVT (deep venous thrombosis) ([place])   . Dysrhythmia   . Esophageal reflux   . 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 MRI Nov '11 - tendonosis and partial tear. Left shoulder surgery Jan '12 for partial shoulder  replacement. [name])   . Headache(784.0)   . History of stomach ulcers 11/2012  . Hypothyroidism    pt states he doeen't have   . Orthostasis   . Other B-complex deficiencies   . Other malaise and fatigue   . Pain in joint, shoulder region    Has chronic shoulder pain  . Pain in limb   . Persistent disorder of initiating or maintaining sleep   . Pneumonia 1980's?; 2011  . Primary localized osteoarthritis of left knee 07/12/2015  . Pure  hypercholesterolemia   . PVC's (premature ventricular contractions)   . S/P cardiac cath 08/08/11   mild to moderate CAD primarily in the LAD. None are obstructive and appear stable from prior cath in 2007; managed medically  . Sebaceous cyst   . Stroke ([place])   . Thrombocytopenia, unspecified ([place])   . Unspecified essential hypertension   . Vascular dementia without behavioral disturbance ([facility])    with periods of amnesia    Past Surgical History:  Procedure Laterality Date  . BACK SURGERY    . CARDIAC CATHETERIZATION  08/2011  . CATARACT EXTRACTION Left 2009  . COLONOSCOPY    . ESOPHAGOGASTRODUODENOSCOPY N/A 11/19/2012   Procedure: ESOPHAGOGASTRODUODENOSCOPY (EGD);  Surgeon: Jerene Bears, MD;  Location: [facility];  Service: Gastroenterology;  Laterality: N/A;  . FINGER AMPUTATION Right    pinky finger  . HAND SURGERY Right    crush injury, right fifth digit contracture, limited  motion  . HARDWARE REMOVAL  02/05/2012   Procedure: HARDWARE REMOVAL;  Surgeon: Johnny Bridge, MD;  Location: [facility];  Service: Orthopedics;  Laterality: Left;  . KNEE SURGERY Left 1991   "did it twice in 1 wk" (02/17/2013)  . LUMBAR SPINE SURGERY  2007   Dr Philip Aspen ([place])  . REVERSE SHOULDER ARTHROPLASTY  02/05/2012   Procedure: REVERSE SHOULDER ARTHROPLASTY;  Surgeon: Johnny Bridge, MD;  Location: [facility];  Service: Orthopedics;  Laterality: Left;  . SHOULDER HEMI-ARTHROPLASTY    . SHOULDER OPEN ROTATOR CUFF REPAIR Left 8.30.2011  . TOTAL KNEE  ARTHROPLASTY Left 07/12/2015   Procedure: TOTAL KNEE ARTHROPLASTY;  Surgeon: Marchia Bond, MD;  Location: [facility];  Service: Orthopedics;  Laterality: Left;  . US ECHOCARDIOGRAPHY  02-28-2010   Est EF 50-55%     Current Outpatient Medications  Medication Sig Dispense Refill  . aspirin EC 81 MG tablet Take 81 mg by mouth daily.     Marland Kitchen bismuth subsalicylate (PEPTO BISMOL) 262 MG/15ML suspension Take 30 mLs by mouth daily as needed for indigestion or diarrhea or loose stools.     . diclofenac sodium (VOLTAREN) 1 % GEL Apply 2 g topically 4 (four) times daily. 100 g 0  . docusate sodium (COLACE) 100 MG capsule Take 1 capsule (100 mg total) by mouth every 12 (twelve) hours. 30 capsule 0  . furosemide (LASIX) 40 MG tablet Take 1 tablet (40 mg total) by mouth daily. 90 tablet 3  . gabapentin (NEURONTIN) 300 MG capsule Take 2 capsules (600 mg) by mouth three times a day 540 capsule 1  . HYDROcodone-acetaminophen (NORCO/VICODIN) 5-325 MG tablet Take 1 tablet by mouth every 6 (six) hours as needed for moderate pain. 30 tablet 0  . isosorbide mononitrate (IMDUR) 30 MG 24 hr tablet Take 1 tablet (30 mg total) by mouth daily. 90 tablet 3  . levothyroxine (SYNTHROID, LEVOTHROID) 50 MCG tablet Take 1 tablet (50 mcg total) by mouth daily. 90 tablet 0  . lidocaine (LIDODERM) 5 % Place 1 patch onto the skin daily. Remove & Discard patch within 12 hours or as directed by MD 30 patch 0  . magnesium hydroxide (MILK OF MAGNESIA) 400 MG/5ML suspension Take 15 mLs by mouth daily as needed for mild constipation or moderate constipation.    . nitroGLYCERIN (NITROSTAT) 0.4 MG SL tablet Place 1 tablet (0.4 mg total) under the tongue every 5 (five) minutes as needed for chest pain. 25 tablet 6  . omeprazole (PRILOSEC) 20 MG capsule Take 1 capsule (20 mg total) by mouth  2 (two) times daily before a meal. 180 capsule 3  . potassium chloride SA (K-DUR,KLOR-CON) 20 MEQ tablet Take 1 tablet (20 mEq total) by mouth 2 (two) times  daily. 180 tablet 0   No current facility-administered medications for this visit.     Allergies:   Crestor [rosuvastatin calcium] and Mestinon [pyridostigmine bromide er]    Social History:  The patient  reports that he quit smoking about 40 years ago. His smoking use included cigars. His smokeless tobacco use includes chew. He reports that he does not drink alcohol or use drugs.   Family History:  The patient's family history includes Breast cancer in his mother; Cancer in his brother and mother; Diabetes in his daughter, mother, and sister; Kidney disease in his father; Stroke in his brother.    ROS: Noted in current history, all other systems are negative.   Physical Exam: Blood pressure 120/70, pulse 62, height 5\' 5"  (1.651 m), weight 163 lb 1.9 oz (74 kg), SpO2 97 %.  GEN:   Elderly gentleman, appears to be very weak but in no acute distress HEENT: Normal NECK: No JVD; No carotid bruits LYMPHATICS: No lymphadenopathy Breast:   Mild swelling of the left breast,  Mod  Tenderness No skin manifestations  CARDIAC: RR,   RESPIRATORY:  Clear to auscultation without rales, wheezing or rhonchi  ABDOMEN: Soft, non-tender, non-distended MUSCULOSKELETAL:   1+ leg edema  SKIN: Warm and dry NEUROLOGIC:  Alert and oriented x 3   EKG:    Recent Labs: 10/29/2017: TSH 6.580 12/12/2017: B Natriuretic Peptide 93.3; Magnesium 2.9 07/29/2018: Hemoglobin 12.4; Platelets 205 10/02/2018: ALT 12; BUN 26; Creatinine, Ser 1.58; Potassium 4.6; Sodium 134   Lipid Panel    Component Value Date/Time   CHOL 181 10/02/2018 1029   TRIG 73 10/02/2018 1029   HDL 55 10/02/2018 1029   CHOLHDL 3.3 10/02/2018 1029   CHOLHDL 2.5 06/04/2016 0857   VLDL 9 06/04/2016 0857   LDLCALC 111 (H) 10/02/2018 1029   LDLDIRECT 140.6 09/25/2013 1030      Wt Readings from Last 3 Encounters:  10/02/18 163 lb 1.9 oz (74 kg)  09/04/18 163 lb 6.4 oz (74.1 kg)  08/04/18 170 lb (77.1 kg)      Other studies  Reviewed: Additional studies/ records that were reviewed today include:  Review of the above records demonstrates:    ASSESSMENT AND PLAN:  1.  - Generalized weakness:   Has been a long term problem  Not cardiac related.  2. Moderate coronary artery disease -     No recent CP     3. Peripheral vascular disease- .   4. Paroxysmal atrial fib:   .stable    4. CVA -   5. DVT:   -   6. Knee arthritis:    7. Chronic combined systolic and diastolic congestive heart failure :    He eats a very salty diet.  Eats salty food for each meal.  Is not able to cook anything else. Eats hotdogs every day, canned soup or stew every day and then a frozen banquet dinner every night.    Current medicines are reviewed at length with the patient today.  The patient does not have concerns regarding medicines.  The following changes have been made:  no change   Disposition:   FU with APP in 6 months     Mertie Moores, MD  10/02/2018 5:58 PM    [address],  , [place]  [address] Phone: [phone]; Fax: [phone]  

## 2018-10-02 NOTE — Patient Instructions (Signed)
Medication Instructions:  Your physician recommends that you continue on your current medications as directed. Please refer to the Current Medication list given to you today.  If you need a refill on your cardiac medications before your next appointment, please call your pharmacy.    Lab work: TODAY - cholesterol, liver panel, basic metabolic panel    Testing/Procedures: None Ordered    Follow-Up: At [facility], you and your health needs are our priority.  As part of our continuing mission to provide you with exceptional heart care, we have created designated Provider Care Teams.  These Care Teams include your primary Cardiologist (physician) and Advanced Practice Providers (APPs -  Physician Assistants and Nurse Practitioners) who all work together to provide you with the care you need, when you need it. You will need a follow up appointment in:  6 months.  You may see Mertie Moores, MD or one of the following Advanced Practice Providers on your designated Care Team: Richardson Dopp, PA-C [place], [place] . Daune Perch, NP

## 2018-10-05 ENCOUNTER — Encounter: Payer: Self-pay | Admitting: Podiatry

## 2018-10-05 NOTE — Progress Notes (Signed)
Subjective: [name] Elliott presents today referred by [name] Guys, MD for diabetic foot examination with cc of painful, discolored, thick toenails which interfere with daily activities.  Pain is aggravated when wearing enclosed shoe gear.   [name] Elliott states he had his right great toe nailplate removed twice and it grew back.   Past Medical History:  Diagnosis Date  . Arthritis   . Arthropathy, unspecified, site unspecified   . Atrial fibrillation ([place])   . Blood dyscrasia    thromboctopenia pt family states he was never told of this  . CAD (coronary artery disease)    last cath in 2012. Managed medically-some blockages  . Chronic lower back pain   . Chronic systolic CHF (congestive heart failure) ([place]) 06/04/2016  . Constipation, chronic   . Diabetes mellitus without complication ([place])    pt states he doesn't have  . DVT (deep venous thrombosis) ([place])   . Dysrhythmia   . Esophageal reflux   . 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 MRI Nov '11 - tendonosis and partial tear. Left shoulder surgery Jan '12 for partial shoulder replacement. [name] Elliott)   . Headache(784.0)   . History of stomach ulcers 11/2012  . Hypothyroidism    pt states he doeen't have   . Orthostasis   . Other B-complex deficiencies   . Other malaise and fatigue   . Pain in joint, shoulder region    Has chronic shoulder pain  . Pain in limb   . Persistent disorder of initiating or maintaining sleep   . Pneumonia 1980's?; 2011  . Primary localized osteoarthritis of left knee 07/12/2015  . Pure hypercholesterolemia   . PVC's (premature ventricular contractions)   . S/P cardiac cath 08/08/11   mild to moderate CAD primarily in the LAD. None are obstructive and appear stable from prior cath in 2007; managed medically  . Sebaceous cyst   . Stroke ([place])   . Thrombocytopenia, unspecified ([place])   . Unspecified essential hypertension   . Vascular dementia without behavioral disturbance  ([place])    with periods of amnesia     Patient Active Problem List   Diagnosis Date Noted  . Memory loss 12/05/2017  . Orthostatic dizziness 12/05/2017  . Idiopathic peripheral neuropathy 12/05/2017  . PAF (paroxysmal atrial fibrillation) ([place]) 04/30/2017  . Shortness of breath 06/04/2016  . Chronic systolic CHF (congestive heart failure) ([place]) 06/04/2016  . Bilateral leg edema 12/12/2015  . Dizziness 09/27/2015  . Primary localized osteoarthritis of left knee 07/12/2015  . Weakness 02/23/2015  . DVT (deep venous thrombosis) ([place]) 12/14/2014  . Combined congestive systolic and diastolic heart failure ([place]) 12/14/2014  . Cerebral infarction due to unspecified mechanism   . TIA (transient ischemic attack) 08/01/2014  . Varicose veins of both legs with edema 05/07/2014  . Bilateral leg pain 04/16/2014  . Chronic kidney disease, stage III (moderate) ([place]) 12/21/2013  . Vascular dementia without behavioral disturbance ([place])   . Type 2 diabetes mellitus, controlled, with renal complications ([place]) 16/06/9603  . Diastolic dysfunction 54/05/8118  . Knee pain, bilateral 03/01/2013  . Hereditary and idiopathic peripheral neuropathy 11/26/2012  . Osteoporosis, unspecified 11/26/2012  . GI bleed due to NSAIDs, DDX=Isch colitis, infectious colitis 11/19/2012  . Cough 05/29/2012  . 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 CONSTIPATION, CHRONIC 08/02/2010  . INSOMNIA, CHRONIC 03/08/2010  . Unspecified hypothyroidism 01/04/2010  . VITAMIN B12 DEFICIENCY 01/04/2010  . HYPERCHOLESTEROLEMIA 08/12/2009  . Essential hypertension 08/12/2009  . CAD (coronary  artery disease) 08/12/2009  . GERD 08/12/2009     Past Surgical History:  Procedure Laterality Date  . BACK SURGERY    . CARDIAC CATHETERIZATION  08/2011  . CATARACT EXTRACTION Left 2009  . COLONOSCOPY    . ESOPHAGOGASTRODUODENOSCOPY N/A 11/19/2012   Procedure: ESOPHAGOGASTRODUODENOSCOPY (EGD);  Surgeon: [name] Bears, MD;   Location: [facility];  Service: Gastroenterology;  Laterality: N/A;  . FINGER AMPUTATION Right    pinky finger  . HAND SURGERY Right    crush injury, right fifth digit contracture, limited  motion  . HARDWARE REMOVAL  02/05/2012   Procedure: HARDWARE REMOVAL;  Surgeon: [name] Bridge, MD;  Location: [facility];  Service: Orthopedics;  Laterality: Left;  . KNEE SURGERY Left 1991   "did it twice in 1 wk" (02/17/2013)  . LUMBAR SPINE SURGERY  2007   Dr [name] Elliott ([place])  . REVERSE SHOULDER ARTHROPLASTY  02/05/2012   Procedure: REVERSE SHOULDER ARTHROPLASTY;  Surgeon: [name] Bridge, MD;  Location: [facility];  Service: Orthopedics;  Laterality: Left;  . SHOULDER HEMI-ARTHROPLASTY    . SHOULDER OPEN ROTATOR CUFF REPAIR Left 8.30.2011  . TOTAL KNEE ARTHROPLASTY Left 07/12/2015   Procedure: TOTAL KNEE ARTHROPLASTY;  Surgeon: [name] Bond, MD;  Location: [facility];  Service: Orthopedics;  Laterality: Left;  . US ECHOCARDIOGRAPHY  02-28-2010   Est EF 50-55%      Current Outpatient Medications:  .  aspirin EC 81 MG tablet, Take 81 mg by mouth daily. , Disp: , Rfl:  .  bismuth subsalicylate (PEPTO BISMOL) 262 MG/15ML suspension, Take 30 mLs by mouth daily as needed for indigestion or diarrhea or loose stools. , Disp: , Rfl:  .  diclofenac sodium (VOLTAREN) 1 % GEL, Apply 2 g topically 4 (four) times daily., Disp: 100 g, Rfl: 0 .  docusate sodium (COLACE) 100 MG capsule, Take 1 capsule (100 mg total) by mouth every 12 (twelve) hours., Disp: 30 capsule, Rfl: 0 .  furosemide (LASIX) 40 MG tablet, Take 1 tablet (40 mg total) by mouth daily., Disp: 90 tablet, Rfl: 3 .  gabapentin (NEURONTIN) 300 MG capsule, Take 2 capsules (600 mg) by mouth three times a day, Disp: 540 capsule, Rfl: 1 .  HYDROcodone-acetaminophen (NORCO/VICODIN) 5-325 MG tablet, Take 1 tablet by mouth every 6 (six) hours as needed for moderate pain., Disp: 30 tablet, Rfl: 0 .  levothyroxine (SYNTHROID, LEVOTHROID) 50 MCG tablet, Take 1 tablet  (50 mcg total) by mouth daily., Disp: 90 tablet, Rfl: 0 .  lidocaine (LIDODERM) 5 %, Place 1 patch onto the skin daily. Remove & Discard patch within 12 hours or as directed by MD, Disp: 30 patch, Rfl: 0 .  magnesium hydroxide (MILK OF MAGNESIA) 400 MG/5ML suspension, Take 15 mLs by mouth daily as needed for mild constipation or moderate constipation., Disp: , Rfl:  .  nitroGLYCERIN (NITROSTAT) 0.4 MG SL tablet, Place 1 tablet (0.4 mg total) under the tongue every 5 (five) minutes as needed for chest pain., Disp: 25 tablet, Rfl: 6 .  omeprazole (PRILOSEC) 20 MG capsule, Take 1 capsule (20 mg total) by mouth 2 (two) times daily before a meal., Disp: 180 capsule, Rfl: 3 .  potassium chloride SA (K-DUR,KLOR-CON) 20 MEQ tablet, Take 1 tablet (20 mEq total) by mouth 2 (two) times daily., Disp: 180 tablet, Rfl: 0 .  isosorbide mononitrate (IMDUR) 30 MG 24 hr tablet, Take 1 tablet (30 mg total) by mouth daily., Disp: 90 tablet, Rfl: 3   Allergies  Allergen Reactions  .  Crestor [Rosuvastatin Calcium] Other (See Comments)    Muscle pain/aches  . Mestinon [Pyridostigmine Bromide Er] Other (See Comments)    GI upset, stomach cramps     Social History   Occupational History  . Occupation: retired    Fish farm manager: RETIRED  Tobacco Use  . Smoking status: Former Smoker    Types: Cigars    Last attempt to quit: 09/03/1978    Years since quitting: 40.1  . Smokeless tobacco: Current User    Types: Chew  . Tobacco comment: 02/17/2013 "I aien't smoked a cigar in 20-30 yr"  Substance and Sexual Activity  . Alcohol use: No    Alcohol/week: 0.0 standard drinks  . Drug use: No  . Sexual activity: Never     Family History  Problem Relation Age of Onset  . Breast cancer Mother   . Cancer Mother   . Diabetes Mother   . Cancer Brother        throat  . Stroke Brother   . Kidney disease Father   . Diabetes Sister   . Diabetes Daughter   . Colon cancer Neg Hx   . Heart attack Neg Hx      Immunization  History  Administered Date(s) Administered  . Influenza Split 06/20/2011, 05/29/2012  . Influenza, High Dose Seasonal PF 06/21/2017, 06/22/2018  . Influenza,inj,Quad PF,6+ Mos 05/26/2013, 06/28/2015, 06/04/2016  . Influenza-Unspecified 07/09/2014, 06/03/2017  . Pneumococcal Conjugate-13 12/28/2014  . Pneumococcal Polysaccharide-23 09/03/2004  . Td 01/08/2014     Review of systems: Positive Findings in bold print.  Constitutional:  chills, fatigue, fever, sweats, weight change Communication: Optometrist, sign Ecologist, hand writing, iPad/Android device Head: headaches, head injury Eyes: changes in vision, eye pain, glaucoma, cataracts, macular degeneration, diplopia, glare,  light sensitivity, eyeglasses or contacts, blindness Ears nose mouth throat: Hard of hearing, ringing in ears, deaf, sign language,  vertigo,   nosebleeds,  rhinitis,  cold sores, snoring, swollen glands Cardiovascular: HTN, edema, arrhythmia, pacemaker in place, defibrillator in place,  chest pain/tightness, chronic anticoagulation, blood clot, heart failure Peripheral Vascular: leg cramps, varicose veins, blood clots, lymphedema Respiratory:  difficulty breathing, denies congestion, SOB, wheezing, cough, emphysema Gastrointestinal: change in appetite or weight, abdominal pain, constipation, diarrhea, nausea, vomiting, vomiting blood, change in bowel habits, abdominal pain, jaundice, rectal bleeding, hemorrhoids, Genitourinary:  nocturia,  pain on urination,  blood in urine, Foley catheter, urinary urgency Musculoskeletal: uses mobility aid,  cramping, stiff joints, painful joints, decreased joint motion, fractures, OA, gout Skin: +changes in toenails, color change, dryness, itching, mole changes,  rash  Neurological: headaches, numbness in feet, paresthesias in feet, burning in feet, fainting,  Stroke, seizures, change in speech. denies headaches, memory problems/poor historian, cerebral palsy, weakness,  paralysis Endocrine: diabetes, hypothyroidism, hyperthyroidism,  goiter, dry mouth, flushing, heat intolerance,  cold intolerance,  excessive thirst, denies polyuria,  nocturia Hematological:  easy bleeding, excessive bleeding, easy bruising, enlarged lymph nodes, on long term blood thinner, history of past transusions Allergy/immunological:  hives, eczema, frequent infections, multiple drug allergies, seasonal allergies, transplant recipient Psychiatric:  anxiety, depression, mood disorder, suicidal ideations, hallucinations   Objective: Vascular Examination: Capillary refill time immediate x 10 digits Dorsalis pedis palpable b/l  Posterior tibial pulses faintly palpable  b/l No digital hair x 10 digits Skin temperature gradient WNL b/l  Dermatological Examination: Skin with normal turgor, texture and tone b/l  Toenails 1-5 b/l discolored, thick, dystrophic with subungual debris and pain with palpation to nailbeds due to thickness of nails.  Musculoskeletal: Muscle  strength 5/5 to all LE muscle groups  Neurological: Sensation intact with 10 gram monofilament Vibratory sensation decreased b/l  Assessment: 1. Painful onychomycosis toenails 1-5 b/l  2. NIDDM 3. Peripheral neuropathy   Plan: 1. Discussed onychomycosis and treatment options.  Literature dispensed on today. 2. Toenails 1-5 b/l were debrided in length and girth without iatrogenic bleeding. 3. Patient to continue soft, supportive shoe gear 4. Patient to report any pedal injuries to medical professional immediately. 5. Follow up 3 months. Patient/POA to call should there be a concern in the interim.

## 2018-11-07 DIAGNOSIS — S2231XA Fracture of one rib, right side, initial encounter for closed fracture: Secondary | ICD-10-CM | POA: Diagnosis not present

## 2018-11-07 DIAGNOSIS — M25511 Pain in right shoulder: Secondary | ICD-10-CM | POA: Diagnosis not present

## 2018-11-07 DIAGNOSIS — M19031 Primary osteoarthritis, right wrist: Secondary | ICD-10-CM | POA: Diagnosis not present

## 2018-11-07 DIAGNOSIS — M1711 Unilateral primary osteoarthritis, right knee: Secondary | ICD-10-CM | POA: Diagnosis not present

## 2018-11-19 ENCOUNTER — Other Ambulatory Visit: Payer: Self-pay

## 2018-11-19 ENCOUNTER — Telehealth: Payer: Self-pay | Admitting: Family Medicine

## 2018-11-19 DIAGNOSIS — G609 Hereditary and idiopathic neuropathy, unspecified: Secondary | ICD-10-CM

## 2018-11-19 MED ORDER — GABAPENTIN 300 MG PO CAPS: ORAL_CAPSULE | ORAL | 0 refills | Status: DC

## 2018-11-19 NOTE — Telephone Encounter (Signed)
Copied from [place] [id number]. Topic: Quick Communication - Rx Refill/Question >> Nov 19, 2018  3:20 PM Selinda Flavin B, NT wrote: Medication: gabapentin (NEURONTIN) 300 MG capsule  Has the patient contacted their pharmacy? Yes.   (Agent: If no, request that the patient contact the pharmacy for the refill.) (Agent: If yes, when and what did the pharmacy advise?)  Preferred Pharmacy (with phone number or street name): [facility] [place], [place]  Agent: Please be advised that RX refills may take up to 3 business days. We ask that you follow-up with your pharmacy.

## 2018-11-19 NOTE — Telephone Encounter (Signed)
Rx sent in

## 2018-12-11 ENCOUNTER — Other Ambulatory Visit: Payer: Self-pay

## 2018-12-11 ENCOUNTER — Telehealth (INDEPENDENT_AMBULATORY_CARE_PROVIDER_SITE_OTHER): Payer: PPO | Admitting: Family Medicine

## 2018-12-11 DIAGNOSIS — E1129 Type 2 diabetes mellitus with other diabetic kidney complication: Secondary | ICD-10-CM | POA: Diagnosis not present

## 2018-12-11 DIAGNOSIS — E039 Hypothyroidism, unspecified: Secondary | ICD-10-CM | POA: Diagnosis not present

## 2018-12-11 DIAGNOSIS — E538 Deficiency of other specified B group vitamins: Secondary | ICD-10-CM

## 2018-12-11 DIAGNOSIS — I5022 Chronic systolic (congestive) heart failure: Secondary | ICD-10-CM | POA: Diagnosis not present

## 2018-12-11 DIAGNOSIS — Z8781 Personal history of (healed) traumatic fracture: Secondary | ICD-10-CM | POA: Diagnosis not present

## 2018-12-11 NOTE — Progress Notes (Signed)
Virtual Visit via telephone Note  I connected with patient's daughter on 12/11/18 at 1126 by telephone and verified that I am speaking with the correct person using two identifiers. [name] Elliott is currently located at home and patient and his daughter is currently with her during visit. The provider, Rutherford Guys, MD is located in their office at time of visit.  I discussed the limitations, risks, security and privacy concerns of performing an evaluation and management service by telephone and the availability of in person appointments. I also discussed with the patient that there may be a patient responsible charge related to this service. The patient expressed understanding and agreed to proceed.  Chief Complaint : 3 mth f/u Pt states that still have Rib pain.   Pt hard of hearing. Pt seen Dr. Para March for "cracked ribs.  Telephone visit today for routine follouwp  HPI ? 85 yo Male with PMH of hypothyroidism, CAD, HTN, CHF, afib, DM2, osteoporosis, CKD3, dementia, vitamin b12 deficiency, hard of hearing,who presents today for routine follouwp  Last OV Jan 2020 History provided by both patient and daughter Since then he tripped over his dog and fractured right ribs. Doing ok except for some pain.  Reports he has long standing issues with walking, gets weak legs and SOB, this has been going on before fractured ribs, not any worse since then Denies any worsening leg edema He is staying at home Has known CHF, echo in 2017 LVEF 40-45%, grade I DD, no significant changes to prior Patient reports compliance with all prescribed medications, incl lasix Has no other concerns today  Lab Results  Component Value Date   CREATININE 1.58 (H) 10/02/2018   BUN 26 10/02/2018   NA 134 10/02/2018   K 4.6 10/02/2018   CL 97 10/02/2018   CO2 21 10/02/2018   Lab Results  Component Value Date   HGBA1C 5.9 (H) 02/07/2018   HGBA1C 6.5 09/26/2015   HGBA1C 7.5 06/28/2015   Lab Results   Component Value Date   MICROALBUR 0.3 12/21/2013   LDLCALC 111 (H) 10/02/2018   CREATININE 1.58 (H) 10/02/2018   Lab Results  Component Value Date   TSH 6.580 (H) 10/29/2017   Lab Results  Component Value Date   VITAMINB12 944 (H) 12/21/2013    Fall Risk  12/11/2018 09/04/2018 08/04/2018 08/04/2018 05/01/2018  Falls in the past year? 1 1 1 1  No  Number falls in past yr: 0 0 0 0 -  Injury with Fall? 1 0 1 - -  Comment pt states due to his dog - - - -     Depression screen [facility] 2/9 12/11/2018 09/04/2018 08/04/2018  Decreased Interest 0 0 0  Down, Depressed, Hopeless 0 0 0  PHQ - 2 Score 0 0 0  Some recent data might be hidden    Allergies  Allergen Reactions  . Crestor [Rosuvastatin Calcium] Other (See Comments)    Muscle pain/aches  . Mestinon [Pyridostigmine Bromide Er] Other (See Comments)    GI upset, stomach cramps    Prior to Admission medications   Medication Sig Start Date End Date Taking? Authorizing Provider  aspirin EC 81 MG tablet Take 81 mg by mouth daily.    Yes [provider]  bismuth subsalicylate (PEPTO BISMOL) 262 MG/15ML suspension Take 30 mLs by mouth daily as needed for indigestion or diarrhea or loose stools.    Yes [provider]  diclofenac sodium (VOLTAREN) 1 % GEL Apply 2 g topically 4 (four)  times daily. 08/04/18  Yes Rutherford Guys, MD  docusate sodium (COLACE) 100 MG capsule Take 1 capsule (100 mg total) by mouth every 12 (twelve) hours. 07/29/18  Yes Harris, Abigail, PA-C  furosemide (LASIX) 40 MG tablet Take 1 tablet (40 mg total) by mouth daily. 07/03/18  Yes Nahser, Wonda Cheng, MD  gabapentin (NEURONTIN) 300 MG capsule Take 2 capsules (600 mg) by mouth three times a day 11/19/18  Yes Rutherford Guys, MD  HYDROcodone-acetaminophen (NORCO/VICODIN) 5-325 MG tablet Take 1 tablet by mouth every 6 (six) hours as needed for moderate pain. 08/04/18  Yes Rutherford Guys, MD  isosorbide mononitrate (IMDUR) 30 MG 24 hr tablet Take 1 tablet (30  mg total) by mouth daily. 10/02/18  Yes Nahser, Wonda Cheng, MD  levothyroxine (SYNTHROID, LEVOTHROID) 50 MCG tablet Take 1 tablet (50 mcg total) by mouth daily. 09/10/18  Yes Rutherford Guys, MD  lidocaine (LIDODERM) 5 % Place 1 patch onto the skin daily. Remove & Discard patch within 12 hours or as directed by MD 09/04/18  Yes Rutherford Guys, MD  magnesium hydroxide (MILK OF MAGNESIA) 400 MG/5ML suspension Take 15 mLs by mouth daily as needed for mild constipation or moderate constipation.   Yes [provider]  nitroGLYCERIN (NITROSTAT) 0.4 MG SL tablet Place 1 tablet (0.4 mg total) under the tongue every 5 (five) minutes as needed for chest pain. 07/03/18  Yes Nahser, Wonda Cheng, MD  omeprazole (PRILOSEC) 20 MG capsule Take 1 capsule (20 mg total) by mouth 2 (two) times daily before a meal. 07/01/18  Yes McVey, Gelene Mink, PA-C  potassium chloride SA (K-DUR,KLOR-CON) 20 MEQ tablet Take 1 tablet (20 mEq total) by mouth 2 (two) times daily. 09/10/18  Yes Rutherford Guys, MD    Past Medical History:  Diagnosis Date  . Arthritis   . Arthropathy, unspecified, site unspecified   . Atrial fibrillation ([place])   . Blood dyscrasia    thromboctopenia pt family states he was never told of this  . CAD (coronary artery disease)    last cath in 2012. Managed medically-some blockages  . Chronic lower back pain   . Chronic systolic CHF (congestive heart failure) ([place]) 06/04/2016  . Constipation, chronic   . Diabetes mellitus without complication ([place])    pt states he doesn't have  . DVT (deep venous thrombosis) ([place])   . Dysrhythmia   . Esophageal reflux   . 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 MRI Nov '11 - tendonosis and partial tear. Left shoulder surgery Jan '12 for partial shoulder replacement. [name])   . Headache(784.0)   . History of stomach ulcers 11/2012  . Hypothyroidism    pt states he doeen't have   . Orthostasis   . Other B-complex deficiencies    . Other malaise and fatigue   . Pain in joint, shoulder region    Has chronic shoulder pain  . Pain in limb   . Persistent disorder of initiating or maintaining sleep   . Pneumonia 1980's?; 2011  . Primary localized osteoarthritis of left knee 07/12/2015  . Pure hypercholesterolemia   . PVC's (premature ventricular contractions)   . S/P cardiac cath 08/08/11   mild to moderate CAD primarily in the LAD. None are obstructive and appear stable from prior cath in 2007; managed medically  . Sebaceous cyst   . Stroke ([place])   . Thrombocytopenia, unspecified ([place])   . Unspecified essential hypertension   . Vascular dementia without behavioral  disturbance (HCC)    with periods of amnesia    Past Surgical History:  Procedure Laterality Date  . BACK SURGERY    . CARDIAC CATHETERIZATION  08/2011  . CATARACT EXTRACTION Left 2009  . COLONOSCOPY    . ESOPHAGOGASTRODUODENOSCOPY N/A 11/19/2012   Procedure: ESOPHAGOGASTRODUODENOSCOPY (EGD);  Surgeon: Jerene Bears, MD;  Location: [facility];  Service: Gastroenterology;  Laterality: N/A;  . FINGER AMPUTATION Right    pinky finger  . HAND SURGERY Right    crush injury, right fifth digit contracture, limited  motion  . HARDWARE REMOVAL  02/05/2012   Procedure: HARDWARE REMOVAL;  Surgeon: Johnny Bridge, MD;  Location: [facility];  Service: Orthopedics;  Laterality: Left;  . KNEE SURGERY Left 1991   "did it twice in 1 wk" (02/17/2013)  . LUMBAR SPINE SURGERY  2007   Dr Philip Aspen ([place])  . REVERSE SHOULDER ARTHROPLASTY  02/05/2012   Procedure: REVERSE SHOULDER ARTHROPLASTY;  Surgeon: Johnny Bridge, MD;  Location: [facility];  Service: Orthopedics;  Laterality: Left;  . SHOULDER HEMI-ARTHROPLASTY    . SHOULDER OPEN ROTATOR CUFF REPAIR Left 8.30.2011  . TOTAL KNEE ARTHROPLASTY Left 07/12/2015   Procedure: TOTAL KNEE ARTHROPLASTY;  Surgeon: Marchia Bond, MD;  Location: [facility];  Service: Orthopedics;  Laterality: Left;  . US ECHOCARDIOGRAPHY  02-28-2010    Est EF 50-55%    Social History   Tobacco Use  . Smoking status: Former Smoker    Types: Cigars    Last attempt to quit: 09/03/1978    Years since quitting: 40.2  . Smokeless tobacco: Current User    Types: Chew  . Tobacco comment: 02/17/2013 "I aien't smoked a cigar in 20-30 yr"  Substance Use Topics  . Alcohol use: No    Alcohol/week: 0.0 standard drinks    Family History  Problem Relation Age of Onset  . Breast cancer Mother   . Cancer Mother   . Diabetes Mother   . Cancer Brother        throat  . Stroke Brother   . Kidney disease Father   . Diabetes Sister   . Diabetes Daughter   . Colon cancer Neg Hx   . Heart attack Neg Hx     ROS None  Objective  Vitals as reported by the patient: none  There were no vitals filed for this visit.  ASSESSMENT and PLAN  1. History of rib fracture Recovering ok. discussed APAP for pain.   2. Chronic systolic CHF (congestive heart failure) (HCC) Seems stable per discussion, no perceived signs of fluid overload.   3. Controlled type 2 diabetes mellitus with other diabetic kidney complication, without long-term current use of insulin (HCC) Checking labs, medications will be adjusted as needed.  - Comprehensive metabolic panel; Future - Hemoglobin A1c; Future - CBC; Future  4. Hypothyroidism, unspecified type Checking labs, medications will be adjusted as needed.  - TSH; Future  5. Vitamin B12 deficiency Checking labs, medications will be adjusted as needed.  - Vitamin B12; Future  FOLLOW-UP: 6 weeks, with labs 3-4 days prior.   The above assessment and management plan was discussed with the patient. The patient verbalized understanding of and has agreed to the management plan. Patient is aware to call the clinic if symptoms persist or worsen. Patient is aware when to return to the clinic for a follow-up visit. Patient educated on when it is appropriate to go to the emergency department.    I provided 23 minutes of  non-face-to-face  time during this encounter.  Rutherford Guys, MD Primary [facility] at [facility] [place], [place] [address] Ph.  [phone] Fax [phone]

## 2018-12-11 NOTE — Progress Notes (Signed)
Chief Complaint : 3 mth f/u Pt states that still have Rib pain.   Pt hard of hearing. Pt seen Dr. Para March for "cracked ribs. Please call pt's daughter if you have any questions [phone]

## 2018-12-14 ENCOUNTER — Other Ambulatory Visit: Payer: Self-pay | Admitting: Family Medicine

## 2018-12-14 DIAGNOSIS — Z8639 Personal history of other endocrine, nutritional and metabolic disease: Secondary | ICD-10-CM

## 2018-12-14 DIAGNOSIS — E079 Disorder of thyroid, unspecified: Secondary | ICD-10-CM

## 2018-12-18 ENCOUNTER — Ambulatory Visit: Payer: PPO | Admitting: Podiatry

## 2018-12-18 ENCOUNTER — Ambulatory Visit: Payer: PPO | Admitting: Family Medicine

## 2019-01-15 ENCOUNTER — Other Ambulatory Visit: Payer: Self-pay

## 2019-01-15 ENCOUNTER — Ambulatory Visit (INDEPENDENT_AMBULATORY_CARE_PROVIDER_SITE_OTHER): Payer: PPO | Admitting: Family Medicine

## 2019-01-15 DIAGNOSIS — E039 Hypothyroidism, unspecified: Secondary | ICD-10-CM | POA: Diagnosis not present

## 2019-01-15 DIAGNOSIS — E079 Disorder of thyroid, unspecified: Secondary | ICD-10-CM

## 2019-01-15 DIAGNOSIS — E538 Deficiency of other specified B group vitamins: Secondary | ICD-10-CM | POA: Diagnosis not present

## 2019-01-15 DIAGNOSIS — E1129 Type 2 diabetes mellitus with other diabetic kidney complication: Secondary | ICD-10-CM

## 2019-01-15 DIAGNOSIS — N183 Chronic kidney disease, stage 3 (moderate): Secondary | ICD-10-CM | POA: Diagnosis not present

## 2019-01-16 LAB — CBC
Hematocrit: 38 % (ref 37.5–51.0)
Hemoglobin: 13.1 g/dL (ref 13.0–17.7)
MCH: 29.8 pg (ref 26.6–33.0)
MCHC: 34.5 g/dL (ref 31.5–35.7)
MCV: 86 fL (ref 79–97)
Platelets: 215 10*3/uL (ref 150–450)
RBC: 4.4 x10E6/uL (ref 4.14–5.80)
RDW: 13.9 % (ref 11.6–15.4)
WBC: 6.3 10*3/uL (ref 3.4–10.8)

## 2019-01-16 LAB — COMPREHENSIVE METABOLIC PANEL
ALT: 11 IU/L (ref 0–44)
AST: 22 IU/L (ref 0–40)
Albumin/Globulin Ratio: 1.6 (ref 1.2–2.2)
Albumin: 4.2 g/dL (ref 3.6–4.6)
Alkaline Phosphatase: 70 IU/L (ref 39–117)
BUN/Creatinine Ratio: 22 (ref 10–24)
BUN: 33 mg/dL — ABNORMAL HIGH (ref 8–27)
Bilirubin Total: 0.8 mg/dL (ref 0.0–1.2)
CO2: 20 mmol/L (ref 20–29)
Calcium: 9.5 mg/dL (ref 8.6–10.2)
Chloride: 100 mmol/L (ref 96–106)
Creatinine, Ser: 1.52 mg/dL — ABNORMAL HIGH (ref 0.76–1.27)
GFR calc Af Amer: 47 mL/min/{1.73_m2} — ABNORMAL LOW (ref >59)
GFR calc non Af Amer: 40 mL/min/{1.73_m2} — ABNORMAL LOW (ref >59)
Globulin, Total: 2.7 g/dL (ref 1.5–4.5)
Glucose: 114 mg/dL — ABNORMAL HIGH (ref 65–99)
Potassium: 4.6 mmol/L (ref 3.5–5.2)
Sodium: 137 mmol/L (ref 134–144)
Total Protein: 6.9 g/dL (ref 6.0–8.5)

## 2019-01-16 LAB — TSH: TSH: 6.54 u[IU]/mL — ABNORMAL HIGH (ref 0.450–4.500)

## 2019-01-16 LAB — HEMOGLOBIN A1C
Est. average glucose Bld gHb Est-mCnc: 151 mg/dL
Hgb A1c MFr Bld: 6.9 % — ABNORMAL HIGH (ref 4.8–5.6)

## 2019-01-16 LAB — VITAMIN B12: Vitamin B-12: 233 pg/mL (ref 232–1245)

## 2019-01-19 ENCOUNTER — Other Ambulatory Visit: Payer: Self-pay

## 2019-01-19 MED ORDER — LEVOTHYROXINE SODIUM 75 MCG PO TABS: 75.0000 ug | ORAL_TABLET | Freq: Every day | ORAL | 0 refills | Status: DC

## 2019-01-19 NOTE — Progress Notes (Signed)
Spoke with daughter [name], she will be getting her father the vitamins and made appt 03/04/2019 for 66 for nurse visit

## 2019-01-19 NOTE — Addendum Note (Signed)
Addended by: Rutherford Guys on: 01/19/2019 02:24 PM   Modules accepted: Orders

## 2019-01-21 ENCOUNTER — Other Ambulatory Visit: Payer: Self-pay

## 2019-01-21 ENCOUNTER — Telehealth (INDEPENDENT_AMBULATORY_CARE_PROVIDER_SITE_OTHER): Payer: PPO | Admitting: Family Medicine

## 2019-01-21 DIAGNOSIS — E039 Hypothyroidism, unspecified: Secondary | ICD-10-CM

## 2019-01-21 DIAGNOSIS — E1129 Type 2 diabetes mellitus with other diabetic kidney complication: Secondary | ICD-10-CM

## 2019-01-21 DIAGNOSIS — E538 Deficiency of other specified B group vitamins: Secondary | ICD-10-CM

## 2019-01-21 NOTE — Progress Notes (Signed)
Spoke with pt the morning and he states he needs to follow-up about medication conditions. This will be a 6 week follow-up. Pt states he feels better some days but other he feel bad since last OV. He states he has not received the medication sent to his pharmacy. Pt states his rib pain is better but he still having trouble with his legs. He has had labs done and will need to go over with him.

## 2019-01-21 NOTE — Progress Notes (Signed)
Virtual Visit Note  I connected with patient on 01/21/19 at 1032am by phone and verified that I am speaking with the correct person using two identifiers. [name] Elliott is currently located at home and patient is currently with them during visit. The provider, Rutherford Guys, MD is located in their office at time of visit.  I discussed the limitations, risks, security and privacy concerns of performing an evaluation and management service by telephone and the availability of in person appointments. I also discussed with the patient that there may be a patient responsible charge related to this service. The patient expressed understanding and agreed to proceed.   CC: routine followup  HPI ? Patient is a 85 y.o. male with past medical history significant for hypothyroidism, CAD, HTN, CHF, afib, DM2, osteoporosis, CKD3, dementiawho presents today for routine follouwp  Last OV Jan 2020  Has not started new dose of thyroid medication Has not started vitamin b12  Daughter will be bring new medications today Has been staying inside, has dogs for company Having occ leg weakness, numbness, tingling Patient reports he is appetite has been decreased Having nocturia x 4-5, has a night light Denies any falls Overall BMs are normal Denies any fever, chills, chest pain, SOB, nausea, vomiting, abd pain, black tarry stools, bright red blood in stools  Rib pain resolved  Lab Results  Component Value Date   HGBA1C 6.9 (H) 01/15/2019   HGBA1C 5.9 (H) 02/07/2018   HGBA1C 6.5 09/26/2015   Lab Results  Component Value Date   MICROALBUR 0.3 12/21/2013   LDLCALC 111 (H) 10/02/2018   CREATININE 1.52 (H) 01/15/2019    Lab Results  Component Value Date   TSH 6.540 (H) 01/15/2019   Lab Results  Component Value Date   VITAMINB12 233 01/15/2019    Allergies  Allergen Reactions  . Crestor [Rosuvastatin Calcium] Other (See Comments)    Muscle pain/aches  . Mestinon [Pyridostigmine Bromide  Er] Other (See Comments)    GI upset, stomach cramps    Prior to Admission medications   Medication Sig Start Date End Date Taking? Authorizing Provider  aspirin EC 81 MG tablet Take 81 mg by mouth daily.    Yes [provider]  bismuth subsalicylate (PEPTO BISMOL) 262 MG/15ML suspension Take 30 mLs by mouth daily as needed for indigestion or diarrhea or loose stools.    Yes [provider]  diclofenac sodium (VOLTAREN) 1 % GEL Apply 2 g topically 4 (four) times daily. 08/04/18  Yes Rutherford Guys, MD  docusate sodium (COLACE) 100 MG capsule Take 1 capsule (100 mg total) by mouth every 12 (twelve) hours. 07/29/18  Yes Harris, Abigail, PA-C  furosemide (LASIX) 40 MG tablet Take 1 tablet (40 mg total) by mouth daily. 07/03/18  Yes Nahser, Wonda Cheng, MD  gabapentin (NEURONTIN) 300 MG capsule Take 2 capsules (600 mg) by mouth three times a day 11/19/18  Yes Rutherford Guys, MD  HYDROcodone-acetaminophen (NORCO/VICODIN) 5-325 MG tablet Take 1 tablet by mouth every 6 (six) hours as needed for moderate pain. 08/04/18  Yes Rutherford Guys, MD  isosorbide mononitrate (IMDUR) 30 MG 24 hr tablet Take 1 tablet (30 mg total) by mouth daily. 10/02/18  Yes Nahser, Wonda Cheng, MD  levothyroxine (SYNTHROID) 75 MCG tablet Take 1 tablet (75 mcg total) by mouth daily before breakfast. 01/19/19  Yes Rutherford Guys, MD  lidocaine (LIDODERM) 5 % Place 1 patch onto the skin daily. Remove & Discard patch within 12 hours  or as directed by MD 09/04/18  Yes Rutherford Guys, MD  magnesium hydroxide (MILK OF MAGNESIA) 400 MG/5ML suspension Take 15 mLs by mouth daily as needed for mild constipation or moderate constipation.   Yes [provider]  nitroGLYCERIN (NITROSTAT) 0.4 MG SL tablet Place 1 tablet (0.4 mg total) under the tongue every 5 (five) minutes as needed for chest pain. 07/03/18  Yes Nahser, Wonda Cheng, MD  omeprazole (PRILOSEC) 20 MG capsule Take 1 capsule (20 mg total) by mouth 2 (two) times  daily before a meal. 07/01/18  Yes McVey, Gelene Mink, PA-C  potassium chloride SA (K-DUR,KLOR-CON) 20 MEQ tablet Take 1 tablet by mouth twice daily 12/14/18  Yes Rutherford Guys, MD    Past Medical History:  Diagnosis Date  . Arthritis   . Arthropathy, unspecified, site unspecified   . Atrial fibrillation ([place])   . Blood dyscrasia    thromboctopenia pt family states he was never told of this  . CAD (coronary artery disease)    last cath in 2012. Managed medically-some blockages  . Chronic lower back pain   . Chronic systolic CHF (congestive heart failure) ([place]) 06/04/2016  . Constipation, chronic   . Diabetes mellitus without complication ([place])    pt states he doesn't have  . DVT (deep venous thrombosis) ([place])   . Dysrhythmia   . Esophageal reflux   . 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 MRI Nov '11 - tendonosis and partial tear. Left shoulder surgery Jan '12 for partial shoulder replacement. [name])   . Headache(784.0)   . History of stomach ulcers 11/2012  . Hypothyroidism    pt states he doeen't have   . Orthostasis   . Other B-complex deficiencies   . Other malaise and fatigue   . Pain in joint, shoulder region    Has chronic shoulder pain  . Pain in limb   . Persistent disorder of initiating or maintaining sleep   . Pneumonia 1980's?; 2011  . Primary localized osteoarthritis of left knee 07/12/2015  . Pure hypercholesterolemia   . PVC's (premature ventricular contractions)   . S/P cardiac cath 08/08/11   mild to moderate CAD primarily in the LAD. None are obstructive and appear stable from prior cath in 2007; managed medically  . Sebaceous cyst   . Stroke ([place])   . Thrombocytopenia, unspecified ([place])   . Unspecified essential hypertension   . Vascular dementia without behavioral disturbance (HCC)    with periods of amnesia    Past Surgical History:  Procedure Laterality Date  . BACK SURGERY    . CARDIAC CATHETERIZATION  08/2011   . CATARACT EXTRACTION Left 2009  . COLONOSCOPY    . ESOPHAGOGASTRODUODENOSCOPY N/A 11/19/2012   Procedure: ESOPHAGOGASTRODUODENOSCOPY (EGD);  Surgeon: Jerene Bears, MD;  Location: [facility];  Service: Gastroenterology;  Laterality: N/A;  . FINGER AMPUTATION Right    pinky finger  . HAND SURGERY Right    crush injury, right fifth digit contracture, limited  motion  . HARDWARE REMOVAL  02/05/2012   Procedure: HARDWARE REMOVAL;  Surgeon: Johnny Bridge, MD;  Location: [facility];  Service: Orthopedics;  Laterality: Left;  . KNEE SURGERY Left 1991   "did it twice in 1 wk" (02/17/2013)  . LUMBAR SPINE SURGERY  2007   Dr Philip Aspen ([place])  . REVERSE SHOULDER ARTHROPLASTY  02/05/2012   Procedure: REVERSE SHOULDER ARTHROPLASTY;  Surgeon: Johnny Bridge, MD;  Location: [facility];  Service: Orthopedics;  Laterality: Left;  .  SHOULDER HEMI-ARTHROPLASTY    . SHOULDER OPEN ROTATOR CUFF REPAIR Left 8.30.2011  . TOTAL KNEE ARTHROPLASTY Left 07/12/2015   Procedure: TOTAL KNEE ARTHROPLASTY;  Surgeon: Marchia Bond, MD;  Location: [facility];  Service: Orthopedics;  Laterality: Left;  . US ECHOCARDIOGRAPHY  02-28-2010   Est EF 50-55%    Social History   Tobacco Use  . Smoking status: Former Smoker    Types: Cigars    Last attempt to quit: 09/03/1978    Years since quitting: 40.4  . Smokeless tobacco: Current User    Types: Chew  . Tobacco comment: 02/17/2013 "I aien't smoked a cigar in 20-30 yr"  Substance Use Topics  . Alcohol use: No    Alcohol/week: 0.0 standard drinks    Family History  Problem Relation Age of Onset  . Breast cancer Mother   . Cancer Mother   . Diabetes Mother   . Cancer Brother        throat  . Stroke Brother   . Kidney disease Father   . Diabetes Sister   . Diabetes Daughter   . Colon cancer Neg Hx   . Heart attack Neg Hx     ROS Per hpi  Objective  Vitals as reported by the patient: none   ASSESSMENT and PLAN  1. Hypothyroidism, unspecified type Not at goal.  Dose increased. Recheck labs prior to next OV.  2. Controlled type 2 diabetes mellitus with other diabetic kidney complication, without long-term current use of insulin (HCC) Diet controlled.  3. Vitamin B12 deficiency Discussed starting OTC supplements 1039mcg once a day as at low end of normal. Recheck labs prior to next OV.  FOLLOW-UP: 3 months with labs 3-4 days prior   The above assessment and management plan was discussed with the patient. The patient verbalized understanding of and has agreed to the management plan. Patient is aware to call the clinic if symptoms persist or worsen. Patient is aware when to return to the clinic for a follow-up visit. Patient educated on when it is appropriate to go to the emergency department.    I provided 14 minutes of non-face-to-face time during this encounter.  Rutherford Guys, MD [facility] at [facility], [place] [address] Ph.  [phone] Fax [phone]

## 2019-01-22 DIAGNOSIS — N183 Chronic kidney disease, stage 3 (moderate): Secondary | ICD-10-CM | POA: Diagnosis not present

## 2019-01-22 DIAGNOSIS — I129 Hypertensive chronic kidney disease with stage 1 through stage 4 chronic kidney disease, or unspecified chronic kidney disease: Secondary | ICD-10-CM | POA: Diagnosis not present

## 2019-01-22 DIAGNOSIS — D631 Anemia in chronic kidney disease: Secondary | ICD-10-CM | POA: Diagnosis not present

## 2019-01-22 DIAGNOSIS — N2581 Secondary hyperparathyroidism of renal origin: Secondary | ICD-10-CM | POA: Diagnosis not present

## 2019-01-23 ENCOUNTER — Telehealth: Payer: Self-pay | Admitting: Family Medicine

## 2019-01-23 NOTE — Progress Notes (Signed)
called pt LVM for him to call us back to reschedule this appt and have labs done 3-4 days prior to visit once scheduled FR

## 2019-01-23 NOTE — Telephone Encounter (Signed)
Called pt LVM for pt o call back and cancel July appt and reschedule for 3 months out  With labs 3-4 days before appt   FR

## 2019-01-28 NOTE — Progress Notes (Signed)
Please let patient's daughter that if he indeed is taking his medication every day, then it just means that the dose of his medication is not adequate and it needs to be slowly increased until thyroid function is at goal. thanks

## 2019-02-15 ENCOUNTER — Other Ambulatory Visit: Payer: Self-pay | Admitting: Family Medicine

## 2019-02-15 DIAGNOSIS — G609 Hereditary and idiopathic neuropathy, unspecified: Secondary | ICD-10-CM

## 2019-02-15 DIAGNOSIS — E079 Disorder of thyroid, unspecified: Secondary | ICD-10-CM

## 2019-03-04 ENCOUNTER — Ambulatory Visit: Payer: Self-pay

## 2019-03-09 ENCOUNTER — Other Ambulatory Visit: Payer: Self-pay

## 2019-03-09 ENCOUNTER — Ambulatory Visit (INDEPENDENT_AMBULATORY_CARE_PROVIDER_SITE_OTHER): Payer: PPO | Admitting: Family Medicine

## 2019-03-09 DIAGNOSIS — E538 Deficiency of other specified B group vitamins: Secondary | ICD-10-CM | POA: Diagnosis not present

## 2019-03-09 DIAGNOSIS — E039 Hypothyroidism, unspecified: Secondary | ICD-10-CM

## 2019-03-09 DIAGNOSIS — E079 Disorder of thyroid, unspecified: Secondary | ICD-10-CM

## 2019-03-10 LAB — TSH: TSH: 0.418 u[IU]/mL — ABNORMAL LOW (ref 0.450–4.500)

## 2019-03-10 LAB — VITAMIN B12: Vitamin B-12: 218 pg/mL — ABNORMAL LOW (ref 232–1245)

## 2019-03-10 MED ORDER — LEVOTHYROXINE SODIUM 75 MCG PO TABS: 75.0000 ug | ORAL_TABLET | ORAL | 0 refills | Status: DC

## 2019-03-10 MED ORDER — LEVOTHYROXINE SODIUM 50 MCG PO TABS: 50.0000 ug | ORAL_TABLET | ORAL | 0 refills | Status: DC

## 2019-03-10 MED ORDER — CYANOCOBALAMIN 1000 MCG/ML IJ SOLN
1000.0000 ug | INTRAMUSCULAR | Status: DC
  Administered 2019-03-25: 1000 ug via INTRAMUSCULAR

## 2019-03-23 ENCOUNTER — Telehealth: Payer: Self-pay | Admitting: Family Medicine

## 2019-03-23 NOTE — Telephone Encounter (Signed)
General/Other - Appointment  The patients daughter called during lunch to schedule an appointment. Please call back at [place]

## 2019-03-25 ENCOUNTER — Encounter: Payer: Self-pay | Admitting: Physician Assistant

## 2019-03-25 ENCOUNTER — Ambulatory Visit (INDEPENDENT_AMBULATORY_CARE_PROVIDER_SITE_OTHER): Payer: PPO | Admitting: Family Medicine

## 2019-03-25 ENCOUNTER — Telehealth (INDEPENDENT_AMBULATORY_CARE_PROVIDER_SITE_OTHER): Payer: PPO | Admitting: Physician Assistant

## 2019-03-25 ENCOUNTER — Ambulatory Visit (INDEPENDENT_AMBULATORY_CARE_PROVIDER_SITE_OTHER): Payer: PPO

## 2019-03-25 ENCOUNTER — Other Ambulatory Visit: Payer: Self-pay

## 2019-03-25 ENCOUNTER — Telehealth: Payer: Self-pay

## 2019-03-25 VITALS — Ht 59.0 in | Wt 168.0 lb

## 2019-03-25 VITALS — BP 119/70 | HR 72 | Temp 98.9°F | Ht 59.0 in | Wt 168.4 lb

## 2019-03-25 DIAGNOSIS — L03115 Cellulitis of right lower limb: Secondary | ICD-10-CM

## 2019-03-25 DIAGNOSIS — Z8639 Personal history of other endocrine, nutritional and metabolic disease: Secondary | ICD-10-CM

## 2019-03-25 DIAGNOSIS — T675XXA Heat exhaustion, unspecified, initial encounter: Secondary | ICD-10-CM

## 2019-03-25 DIAGNOSIS — I5042 Chronic combined systolic (congestive) and diastolic (congestive) heart failure: Secondary | ICD-10-CM

## 2019-03-25 DIAGNOSIS — I480 Paroxysmal atrial fibrillation: Secondary | ICD-10-CM

## 2019-03-25 DIAGNOSIS — E538 Deficiency of other specified B group vitamins: Secondary | ICD-10-CM | POA: Diagnosis not present

## 2019-03-25 DIAGNOSIS — R600 Localized edema: Secondary | ICD-10-CM

## 2019-03-25 DIAGNOSIS — Z8719 Personal history of other diseases of the digestive system: Secondary | ICD-10-CM

## 2019-03-25 DIAGNOSIS — I2510 Atherosclerotic heart disease of native coronary artery without angina pectoris: Secondary | ICD-10-CM

## 2019-03-25 MED ORDER — DOXYCYCLINE HYCLATE 100 MG PO TABS: 100.0000 mg | ORAL_TABLET | Freq: Two times a day (BID) | ORAL | 0 refills | Status: DC

## 2019-03-25 MED ORDER — POTASSIUM CHLORIDE CRYS ER 20 MEQ PO TBCR: 20.0000 meq | EXTENDED_RELEASE_TABLET | Freq: Two times a day (BID) | ORAL | 0 refills | Status: DC

## 2019-03-25 MED ORDER — AMOXICILLIN 500 MG PO CAPS: 500.0000 mg | ORAL_CAPSULE | Freq: Two times a day (BID) | ORAL | 0 refills | Status: DC

## 2019-03-25 NOTE — Progress Notes (Signed)
Acute Office Visit  Subjective:    Patient ID: [name] Elliott, male    DOB: Jul 28, 1928, 85 y.o.   MRN: [id number]  Chief Complaint  Patient presents with  . Leg Swelling    bilateral  . Foot Swelling    bilateral  . Headache    everyday  cc Mr Abboud is c/o bilateral feet and leg pain and swelling, he is stating that there is a sore on the lower right leg that is getting worse. He also states that he is having headaches about every day and c/o neck pain on the right side. Monday 03/23/19 he states that he felt like he was having cold chills like the inside of his body was freezing cold.  HPI Patient is in today for  bilat leg swelling-worse on the right leg Pt with chills after coming in from outside for 2 hours yesterday working on his lawnmower Fatigue associated, no instability, no loss of strength, headache noted with chills. Pt denies CP,denies vomiting , +nausea, no SOB-pt drank no water for 2 hours and drank only a small amount of water with breakfast prior to going outside-heat index greater than 100  B12 deficiency-need shot  Leg swelling long term-worsening with rash noted for 2 weeks with redness, drainage on the right lower leg-status dermatitis in the past Past Medical History:  Diagnosis Date  . Arthritis   . Arthropathy, unspecified, site unspecified   . Atrial fibrillation ([place])   . Blood dyscrasia    thromboctopenia pt family states he was never told of this  . CAD (coronary artery disease)    last cath in 2012. Managed medically-some blockages  . Chronic lower back pain   . Chronic systolic CHF (congestive heart failure) ([place] [facility]) 06/04/2016  . Constipation, chronic   . Diabetes mellitus without complication ([place])    pt states he doesn't have  . DVT (deep venous thrombosis) ([place])   . Dysrhythmia   . Esophageal reflux   . 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 MRI Nov '11 - tendonosis and partial tear. Left shoulder surgery Jan '12  for partial shoulder replacement. [name])   . Headache(784.0)   . History of stomach ulcers 11/2012  . Hypothyroidism    pt states he doeen't have   . Orthostasis   . Other B-complex deficiencies   . Other malaise and fatigue   . Pain in joint, shoulder region    Has chronic shoulder pain  . Pain in limb   . Persistent disorder of initiating or maintaining sleep   . Pneumonia 1980's?; 2011  . Primary localized osteoarthritis of left knee 07/12/2015  . Pure hypercholesterolemia   . PVC's (premature ventricular contractions)   . S/P cardiac cath 08/08/11   mild to moderate CAD primarily in the LAD. None are obstructive and appear stable from prior cath in 2007; managed medically  . Sebaceous cyst   . Stroke ([place])   . Thrombocytopenia, unspecified ([place])   . Unspecified essential hypertension   . Vascular dementia without behavioral disturbance (HCC)    with periods of amnesia    Past Surgical History:  Procedure Laterality Date  . BACK SURGERY    . CARDIAC CATHETERIZATION  08/2011  . CATARACT EXTRACTION Left 2009  . COLONOSCOPY    . ESOPHAGOGASTRODUODENOSCOPY N/A 11/19/2012   Procedure: ESOPHAGOGASTRODUODENOSCOPY (EGD);  Surgeon: Jerene Bears, MD;  Location: [facility];  Service: Gastroenterology;  Laterality: N/A;  . FINGER AMPUTATION Right    pinky finger  .  HAND SURGERY Right    crush injury, right fifth digit contracture, limited  motion  . HARDWARE REMOVAL  02/05/2012   Procedure: HARDWARE REMOVAL;  Surgeon: Johnny Bridge, MD;  Location: [facility];  Service: Orthopedics;  Laterality: Left;  . KNEE SURGERY Left 1991   "did it twice in 1 wk" (02/17/2013)  . LUMBAR SPINE SURGERY  2007   Dr Philip Aspen ([place])  . REVERSE SHOULDER ARTHROPLASTY  02/05/2012   Procedure: REVERSE SHOULDER ARTHROPLASTY;  Surgeon: Johnny Bridge, MD;  Location: [facility];  Service: Orthopedics;  Laterality: Left;  . SHOULDER HEMI-ARTHROPLASTY    . SHOULDER OPEN ROTATOR CUFF REPAIR Left 8.30.2011  .  TOTAL KNEE ARTHROPLASTY Left 07/12/2015   Procedure: TOTAL KNEE ARTHROPLASTY;  Surgeon: Marchia Bond, MD;  Location: [facility];  Service: Orthopedics;  Laterality: Left;  . US ECHOCARDIOGRAPHY  02-28-2010   Est EF 50-55%    Family History  Problem Relation Age of Onset  . Breast cancer Mother   . Cancer Mother   . Diabetes Mother   . Cancer Brother        throat  . Stroke Brother   . Kidney disease Father   . Diabetes Sister   . Diabetes Daughter   . Colon cancer Neg Hx   . Heart attack Neg Hx     Social History   Socioeconomic History  . Marital status: Widowed    Spouse name: Not on file  . Number of children: 2  . Years of education: 3rd  . Highest education level: Not on file  Occupational History  . Occupation: retired    Fish farm manager: RETIRED  Social Needs  . Financial resource strain: Not on file  . Food insecurity    Worry: Not on file    Inability: Not on file  . Transportation needs    Medical: Not on file    Non-medical: Not on file  Tobacco Use  . Smoking status: Former Smoker    Types: Cigars    Quit date: 09/03/1978    Years since quitting: 40.5  . Smokeless tobacco: Current User    Types: Chew  . Tobacco comment: 02/17/2013 "I aien't smoked a cigar in 20-30 yr"  Substance and Sexual Activity  . Alcohol use: No    Alcohol/week: 0.0 standard drinks  . Drug use: No  . Sexual activity: Never  Lifestyle  . Physical activity    Days per week: Not on file    Minutes per session: Not on file  . Stress: Not on file  Relationships  . Social Herbalist on phone: Not on file    Gets together: Not on file    Attends religious service: Not on file    Active member of club or organization: Not on file    Attends meetings of clubs or organizations: Not on file    Relationship status: Not on file  . Intimate partner violence    Fear of current or ex partner: Not on file    Emotionally abused: Not on file    Physically abused: Not on file    Forced  sexual activity: Not on file  Other Topics Concern  . Not on file  Social History Narrative   3rd grade education. Married '58, '95. 2 dtr '59, '64, youngest daughter died septic kidney.    Lives alone, handles all ADLs   Right handed.   1 cup coffee per day, occasional soda or tea.  Has living will   Daughter [name] is health care POA   Would accept resuscitation attempts--- but no prolonged ventilation.   Not sure about tube feeds.             Outpatient Medications Prior to Visit  Medication Sig Dispense Refill  . aspirin EC 81 MG tablet Take 81 mg by mouth daily.     Marland Kitchen bismuth subsalicylate (PEPTO BISMOL) 262 MG/15ML suspension Take 30 mLs by mouth daily as needed for indigestion or diarrhea or loose stools.     . diclofenac sodium (VOLTAREN) 1 % GEL Apply 2 g topically 4 (four) times daily. 100 g 0  . docusate sodium (COLACE) 100 MG capsule Take 1 capsule (100 mg total) by mouth every 12 (twelve) hours. 30 capsule 0  . furosemide (LASIX) 40 MG tablet Take 1 tablet (40 mg total) by mouth daily. 90 tablet 3  . gabapentin (NEURONTIN) 300 MG capsule TAKE 2 CAPSULES BY MOUTH THREE TIMES DAILY 540 capsule 0  . isosorbide mononitrate (IMDUR) 30 MG 24 hr tablet Take 1 tablet (30 mg total) by mouth daily. 90 tablet 3  . levothyroxine (SYNTHROID) 50 MCG tablet Take 1 tablet (50 mcg total) by mouth every other day. 30 minutes before breakfast. Alternated with 23mg tablet. 45 tablet 0  . levothyroxine (SYNTHROID) 75 MCG tablet Take 1 tablet (75 mcg total) by mouth every other day. 30 minutes before breakfast. Alternate with 572m tablet. 45 tablet 0  . lidocaine (LIDODERM) 5 % Place 1 patch onto the skin daily. Remove & Discard patch within 12 hours or as directed by MD 30 patch 0  . magnesium hydroxide (MILK OF MAGNESIA) 400 MG/5ML suspension Take 15 mLs by mouth daily as needed for mild constipation or moderate constipation.    . nitroGLYCERIN (NITROSTAT) 0.4 MG SL tablet Place 1  tablet (0.4 mg total) under the tongue every 5 (five) minutes as needed for chest pain. 25 tablet 6  . omeprazole (PRILOSEC) 20 MG capsule Take 1 capsule (20 mg total) by mouth 2 (two) times daily before a meal. 180 capsule 3  . potassium chloride SA (K-DUR,KLOR-CON) 20 MEQ tablet Take 1 tablet by mouth twice daily 180 tablet 0   Facility-Administered Medications Prior to Visit  Medication Dose Route Frequency Provider Last Rate Last Dose  . cyanocobalamin ((VITAMIN B-12)) injection 1,000 mcg  1,000 mcg Intramuscular Q30 days SaRutherford GuysMD   1,000 mcg at 03/25/19 1219    Allergies  Allergen Reactions  . Crestor [Rosuvastatin Calcium] Other (See Comments)    Muscle pain/aches  . Mestinon [Pyridostigmine Bromide Er] Other (See Comments)    GI upset, stomach cramps    Review of Systems  Constitutional: Positive for chills and malaise/fatigue. Negative for fever.  Respiratory: Negative for cough and shortness of breath.   Cardiovascular: Positive for leg swelling. Negative for chest pain and palpitations.  Skin: Positive for rash.  Neurological: Positive for dizziness, weakness and headaches.       Objective:    Physical Exam  Constitutional: He is oriented to person, place, and time. He appears well-developed and well-nourished. No distress.  HENT:  Head: Normocephalic and atraumatic.  Eyes: Conjunctivae are normal.  Cardiovascular: Normal rate and regular rhythm.  Pulmonary/Chest: Effort normal and breath sounds normal.  Musculoskeletal:        General: Tenderness and edema present.  Neurological: He is alert and oriented to person, place, and time.  Skin: Rash noted. There is erythema.  right  lower leg eryth, swelling, crusting, no drainage, left lower leg, eryth, no crusting  BP 119/70 (BP Location: Left Arm, Patient Position: Sitting, Cuff Size: Normal)   Pulse 72   Temp 98.9 F (37.2 C) (Oral)   Ht 4' 11"  (1.499 m)   Wt 168 lb 6.4 oz (76.4 kg)   SpO2 98%   BMI  34.01 kg/m  Wt Readings from Last 3 Encounters:  03/25/19 168 lb (76.2 kg)  03/25/19 168 lb 6.4 oz (76.4 kg)  10/02/18 163 lb 1.9 oz (74 kg)    Health Maintenance Due  Topic Date Due  . COLONOSCOPY  11/23/2014  . OPHTHALMOLOGY EXAM  12/08/2014  . URINE MICROALBUMIN  12/22/2014  . FOOT EXAM  02/08/2019     Lab Results  Component Value Date   TSH 0.418 (L) 03/09/2019   Lab Results  Component Value Date   WBC 8.1 03/25/2019   HGB 12.7 (L) 03/25/2019   HCT 38.0 03/25/2019   MCV 89 03/25/2019   PLT 200 03/25/2019   Lab Results  Component Value Date   NA 137 03/25/2019   K 4.6 03/25/2019   CO2 20 03/25/2019   GLUCOSE 122 (H) 03/25/2019   BUN 33 (H) 03/25/2019   CREATININE 1.72 (H) 03/25/2019   BILITOT 2.0 (H) 03/25/2019   ALKPHOS 63 03/25/2019   AST 20 03/25/2019   ALT 12 03/25/2019   PROT 7.1 03/25/2019   ALBUMIN 4.2 03/25/2019   CALCIUM 9.4 03/25/2019   ANIONGAP 8 07/29/2018   GFR 53.97 (L) 07/27/2014   Lab Results  Component Value Date   CHOL 181 10/02/2018   Lab Results  Component Value Date   HDL 55 10/02/2018   Lab Results  Component Value Date   LDLCALC 111 (H) 10/02/2018   Lab Results  Component Value Date   TRIG 73 10/02/2018   Lab Results  Component Value Date   CHOLHDL 3.3 10/02/2018   Lab Results  Component Value Date   HGBA1C 6.9 (H) 01/15/2019       Assessment & Plan:     Visit Diagnoses    Vitamin B12 deficiency    -  Primary   Heat exhaustion, initial encounter       Relevant Orders   CBC with Differential (Completed)   CMP14+EGFR (Completed)   History of low potassium       Relevant Medications   potassium chloride SA (K-DUR) 20 MEQ tablet   Cellulitis of leg, right       Relevant Orders   DG Tibia/Fibula Right (Completed)    No bony involvement D/w daughter and pt-cool environment-no outside activity, fluids, limit exertion Elevation legs-will recheck markings after starting antibiotics If CP/SOB/Feveer/LOC/mental  status changes or other concerns-ER Discussed concerns for heat exhaustion vs heat stroke Discussed concern for cellulitis outpt vs inpt treatment-cbc and xray  Meds ordered this encounter  Medications  . doxycycline (VIBRA-TABS) 100 MG tablet    Sig: Take 1 tablet (100 mg total) by mouth 2 (two) times daily.    Dispense:  20 tablet    Refill:  0  . amoxicillin (AMOXIL) 500 MG capsule    Sig: Take 1 capsule (500 mg total) by mouth 2 (two) times daily.    Dispense:  20 capsule    Refill:  0  . potassium chloride SA (K-DUR) 20 MEQ tablet    Sig: Take 1 tablet (20 mEq total) by mouth 2 (two) times daily.    Dispense:  180 tablet  Refill:  0    LISA Hannah Beat, MD

## 2019-03-25 NOTE — Telephone Encounter (Signed)
Scheduled appt.

## 2019-03-25 NOTE — Progress Notes (Signed)
Virtual Visit via Telephone Note   This visit type was conducted due to national recommendations for restrictions regarding the COVID-19 Pandemic (e.g. social distancing) in an effort to limit this patient's exposure and mitigate transmission in our community.  Due to his co-morbid illnesses, this patient is at least at moderate risk for complications without adequate follow up.  This format is felt to be most appropriate for this patient at this time.  The patient did not have access to video technology/had technical difficulties with video requiring transitioning to audio format only (telephone).  All issues noted in this document were discussed and addressed.  No physical exam could be performed with this format.  Please refer to the patient's chart for his  consent to telehealth for [facility].   Date:  03/25/2019   ID:  Karolee Ohs, DOB [birth year], MRN [id number]  Patient Location: Home Provider Location: Home  PCP:  Rutherford Guys, MD  Cardiologist:  Mertie Moores, MD  Evaluation Performed:  Follow-Up Visit  Chief Complaint:  6 months follow up for CAD  History of Present Illness:    [name] Elliott is a 85 y.o. male with hx of medically managed CAD, PAF, CVA, PVD, DM,  DVT, chronic combined CHF, CKD III and dementia seen for follow up.  Cardiac catheterization 2012 demonstrated moderate diffuse CAD.  The LAD and D1 stenoses were not suitable for PCI and he has been managed medically.  Previously on Xarelto for anticoagulation which has been discontinued 2nd to recurrent GI bleed.   Patient has dementia and hard to get history over the phone.  No family member with him.  Seems patient was seen by Dr. this morning for lower extremity issue.  Says he was prescribed antibiotic. leg is swollen.  Denies chest pain, syncope or breathing problem by laying down.   The patient does not have symptoms concerning for COVID-19 infection (fever, chills, cough, or new shortness of  breath).    Past Medical History:  Diagnosis Date  . Arthritis   . Arthropathy, unspecified, site unspecified   . Atrial fibrillation ([place])   . Blood dyscrasia    thromboctopenia pt family states he was never told of this  . CAD (coronary artery disease)    last cath in 2012. Managed medically-some blockages  . Chronic lower back pain   . Chronic systolic CHF (congestive heart failure) ([place]) 06/04/2016  . Constipation, chronic   . Diabetes mellitus without complication ([place])    pt states he doesn't have  . DVT (deep venous thrombosis) ([place])   . Dysrhythmia   . Esophageal reflux   . 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 MRI Nov '11 - tendonosis and partial tear. Left shoulder surgery Jan '12 for partial shoulder replacement. [name])   . Headache(784.0)   . History of stomach ulcers 11/2012  . Hypothyroidism    pt states he doeen't have   . Orthostasis   . Other B-complex deficiencies   . Other malaise and fatigue   . Pain in joint, shoulder region    Has chronic shoulder pain  . Pain in limb   . Persistent disorder of initiating or maintaining sleep   . Pneumonia 1980's?; 2011  . Primary localized osteoarthritis of left knee 07/12/2015  . Pure hypercholesterolemia   . PVC's (premature ventricular contractions)   . S/P cardiac cath 08/08/11   mild to moderate CAD primarily in the LAD. None are obstructive and appear stable from prior cath in  2007; managed medically  . Sebaceous cyst   . Stroke ([place])   . Thrombocytopenia, unspecified ([place])   . Unspecified essential hypertension   . Vascular dementia without behavioral disturbance ([facility])    with periods of amnesia   Past Surgical History:  Procedure Laterality Date  . BACK SURGERY    . CARDIAC CATHETERIZATION  08/2011  . CATARACT EXTRACTION Left 2009  . COLONOSCOPY    . ESOPHAGOGASTRODUODENOSCOPY N/A 11/19/2012   Procedure: ESOPHAGOGASTRODUODENOSCOPY (EGD);  Surgeon: Jerene Bears, MD;   Location: [place];  Service: Gastroenterology;  Laterality: N/A;  . FINGER AMPUTATION Right    pinky finger  . HAND SURGERY Right    crush injury, right fifth digit contracture, limited  motion  . HARDWARE REMOVAL  02/05/2012   Procedure: HARDWARE REMOVAL;  Surgeon: Johnny Bridge, MD;  Location: [place];  Service: Orthopedics;  Laterality: Left;  . KNEE SURGERY Left 1991   "did it twice in 1 wk" (02/17/2013)  . LUMBAR SPINE SURGERY  2007   Dr Philip Aspen ([place])  . REVERSE SHOULDER ARTHROPLASTY  02/05/2012   Procedure: REVERSE SHOULDER ARTHROPLASTY;  Surgeon: Johnny Bridge, MD;  Location: [place];  Service: Orthopedics;  Laterality: Left;  . SHOULDER HEMI-ARTHROPLASTY    . SHOULDER OPEN ROTATOR CUFF REPAIR Left 8.30.2011  . TOTAL KNEE ARTHROPLASTY Left 07/12/2015   Procedure: TOTAL KNEE ARTHROPLASTY;  Surgeon: Marchia Bond, MD;  Location: [place];  Service: Orthopedics;  Laterality: Left;  . US ECHOCARDIOGRAPHY  02-28-2010   Est EF 50-55%     Current Meds  Medication Sig  . amoxicillin (AMOXIL) 500 MG capsule Take 1 capsule (500 mg total) by mouth 2 (two) times daily.  Marland Kitchen aspirin EC 81 MG tablet Take 81 mg by mouth daily.   Marland Kitchen bismuth subsalicylate (PEPTO BISMOL) 262 MG/15ML suspension Take 30 mLs by mouth daily as needed for indigestion or diarrhea or loose stools.   . diclofenac sodium (VOLTAREN) 1 % GEL Apply 2 g topically 4 (four) times daily.  Marland Kitchen docusate sodium (COLACE) 100 MG capsule Take 1 capsule (100 mg total) by mouth every 12 (twelve) hours.  Marland Kitchen doxycycline (VIBRA-TABS) 100 MG tablet Take 1 tablet (100 mg total) by mouth 2 (two) times daily.  . furosemide (LASIX) 40 MG tablet Take 1 tablet (40 mg total) by mouth daily.  Marland Kitchen gabapentin (NEURONTIN) 300 MG capsule TAKE 2 CAPSULES BY MOUTH THREE TIMES DAILY  . isosorbide mononitrate (IMDUR) 30 MG 24 hr tablet Take 1 tablet (30 mg total) by mouth daily.  Marland Kitchen levothyroxine (SYNTHROID) 50 MCG tablet Take 1 tablet (50 mcg total) by mouth  every other day. 30 minutes before breakfast. Alternated with 57mcg tablet.  Marland Kitchen levothyroxine (SYNTHROID) 75 MCG tablet Take 1 tablet (75 mcg total) by mouth every other day. 30 minutes before breakfast. Alternate with 38mcg tablet.  . lidocaine (LIDODERM) 5 % Place 1 patch onto the skin daily. Remove & Discard patch within 12 hours or as directed by MD  . magnesium hydroxide (MILK OF MAGNESIA) 400 MG/5ML suspension Take 15 mLs by mouth daily as needed for mild constipation or moderate constipation.  . nitroGLYCERIN (NITROSTAT) 0.4 MG SL tablet Place 1 tablet (0.4 mg total) under the tongue every 5 (five) minutes as needed for chest pain.  Marland Kitchen omeprazole (PRILOSEC) 20 MG capsule Take 1 capsule (20 mg total) by mouth 2 (two) times daily before a meal.  . potassium chloride SA (K-DUR) 20 MEQ tablet Take 1 tablet (20 mEq total)  by mouth 2 (two) times daily.   Current Facility-Administered Medications for the 03/25/19 encounter (Telemedicine) with Leanor Kail, PA  Medication  . cyanocobalamin ((VITAMIN B-12)) injection 1,000 mcg     Allergies:   Crestor [rosuvastatin calcium] and Mestinon [pyridostigmine bromide er]   Social History   Tobacco Use  . Smoking status: Former Smoker    Types: Cigars    Quit date: 09/03/1978    Years since quitting: 40.5  . Smokeless tobacco: Current User    Types: Chew  . Tobacco comment: 02/17/2013 "I aien't smoked a cigar in 20-30 yr"  Substance Use Topics  . Alcohol use: No    Alcohol/week: 0.0 standard drinks  . Drug use: No     Family Hx: The patient's family history includes Breast cancer in his mother; Cancer in his brother and mother; Diabetes in his daughter, mother, and sister; Kidney disease in his father; Stroke in his brother. There is no history of Colon cancer or Heart attack.  ROS:   Please see the history of present illness.    All other systems reviewed and are negative.   Prior CV studies:   The following studies were reviewed  today:  Echo 06/2016 Study Conclusions  - Left ventricle: The cavity size was normal. Wall thickness was   normal. Systolic function was mildly to moderately reduced. The   estimated ejection fraction was in the range of 40% to 45%.   Diffuse hypokinesis. Doppler parameters are consistent with   abnormal left ventricular relaxation (grade 1 diastolic   dysfunction). - Aortic valve: Trileaflet; mildly thickened, mildly calcified   leaflets. There was mild to moderate regurgitation. - Aorta: Ascending aortic diameter: 39 mm (S). - Ascending aorta: The ascending aorta was mildly dilated.  Impressions:  - Compared to the prior study, there has been no significant   interval change.  Labs/Other Tests and Data Reviewed:    EKG:  No ECG reviewed.  Recent Labs: 01/15/2019: ALT 11; BUN 33; Creatinine, Ser 1.52; Hemoglobin 13.1; Platelets 215; Potassium 4.6; Sodium 137 03/09/2019: TSH 0.418   Recent Lipid Panel Lab Results  Component Value Date/Time   CHOL 181 10/02/2018 10:29 AM   TRIG 73 10/02/2018 10:29 AM   HDL 55 10/02/2018 10:29 AM   CHOLHDL 3.3 10/02/2018 10:29 AM   CHOLHDL 2.5 06/04/2016 08:57 AM   LDLCALC 111 (H) 10/02/2018 10:29 AM   LDLDIRECT 140.6 09/25/2013 10:30 AM    Wt Readings from Last 3 Encounters:  03/25/19 168 lb (76.2 kg)  03/25/19 168 lb 6.4 oz (76.4 kg)  10/02/18 163 lb 1.9 oz (74 kg)     Objective:    Vital Signs:  Ht 4\' 11"  (1.499 m)   Wt 168 lb (76.2 kg)   BMI 33.93 kg/m    VITAL SIGNS:  reviewed GEN:  no acute distress NEURO:  alert and oriented  PSYCH:  Hard of hearing.   ASSESSMENT & PLAN:    1. CAD medically managed -No anginal type symptoms. continue current medical therapy.  2. Chronic combined CHF -No orthopnea or PND.  He has lower extremity edema and prescribed antibiotic by Dr. this morning.    3. PAF - Not on anticoagulation due to recurrent GI bleed  He will benefit from in office evaluation at follow-up.   COVID-19  Education: The signs and symptoms of COVID-19 were discussed with the patient and how to seek care for testing (follow up with PCP or arrange E-visit).  The importance of social distancing  was discussed today.  Time:   Today, I have spent 7 minutes with the patient with telehealth technology discussing the above problems.     Medication Adjustments/Labs and Tests Ordered: Current medicines are reviewed at length with the patient today.  Concerns regarding medicines are outlined above.   Tests Ordered: No orders of the defined types were placed in this encounter.   Medication Changes: No orders of the defined types were placed in this encounter.   Follow Up:  In Person in 4 month(s)  Signed, Leanor Kail, [place]  03/25/2019 1:56 PM    [facility]

## 2019-03-25 NOTE — Telephone Encounter (Signed)

## 2019-03-25 NOTE — Patient Instructions (Addendum)
Vit B 12 today labwork today Avoid outdoors due to heat Drink water throughout the day Elevated legs while sitting, avoid compression stockings F/u next week Pick up 2 antibiotics  Xray of the leg

## 2019-03-25 NOTE — Patient Instructions (Signed)
Medication Instructions:  Your physician recommends that you continue on your current medications as directed. Please refer to the Current Medication list given to you today.  If you need a refill on your cardiac medications before your next appointment, please call your pharmacy.   Lab work: NONE If you have labs (blood work) drawn today and your tests are completely normal, you will receive your results only by: Marland Kitchen MyChart Message (if you have MyChart) OR . A paper copy in the mail If you have any lab test that is abnormal or we need to change your treatment, we will call you to review the results.  Testing/Procedures: NONE  Follow-Up: At [facility], you and your health needs are our priority.  As part of our continuing mission to provide you with exceptional heart care, we have created designated Provider Care Teams.  These Care Teams include your primary Cardiologist (physician) and Advanced Practice Providers (APPs -  Physician Assistants and Nurse Practitioners) who all work together to provide you with the care you need, when you need it. You will need a follow up appointment in:  4 months IN OFFICE. You may see Mertie Moores, MD or one of the following Advanced Practice Providers on your designated Care Team: Richardson Dopp, PA-C [place], [place] . Daune Perch, NP  Any Other Special Instructions Will Be Listed Below (If Applicable).

## 2019-03-26 LAB — CBC WITH DIFFERENTIAL/PLATELET
Basophils Absolute: 0.1 10*3/uL (ref 0.0–0.2)
Basos: 1 %
EOS (ABSOLUTE): 0.2 10*3/uL (ref 0.0–0.4)
Eos: 3 %
Hematocrit: 38 % (ref 37.5–51.0)
Hemoglobin: 12.7 g/dL — ABNORMAL LOW (ref 13.0–17.7)
Immature Grans (Abs): 0 10*3/uL (ref 0.0–0.1)
Immature Granulocytes: 0 %
Lymphocytes Absolute: 1.4 10*3/uL (ref 0.7–3.1)
Lymphs: 17 %
MCH: 29.7 pg (ref 26.6–33.0)
MCHC: 33.4 g/dL (ref 31.5–35.7)
MCV: 89 fL (ref 79–97)
Monocytes Absolute: 0.7 10*3/uL (ref 0.1–0.9)
Monocytes: 8 %
Neutrophils Absolute: 5.7 10*3/uL (ref 1.4–7.0)
Neutrophils: 71 %
Platelets: 200 10*3/uL (ref 150–450)
RBC: 4.27 x10E6/uL (ref 4.14–5.80)
RDW: 13 % (ref 11.6–15.4)
WBC: 8.1 10*3/uL (ref 3.4–10.8)

## 2019-03-26 LAB — CMP14+EGFR
ALT: 12 IU/L (ref 0–44)
AST: 20 IU/L (ref 0–40)
Albumin/Globulin Ratio: 1.4 (ref 1.2–2.2)
Albumin: 4.2 g/dL (ref 3.6–4.6)
Alkaline Phosphatase: 63 IU/L (ref 39–117)
BUN/Creatinine Ratio: 19 (ref 10–24)
BUN: 33 mg/dL — ABNORMAL HIGH (ref 8–27)
Bilirubin Total: 2 mg/dL — ABNORMAL HIGH (ref 0.0–1.2)
CO2: 20 mmol/L (ref 20–29)
Calcium: 9.4 mg/dL (ref 8.6–10.2)
Chloride: 99 mmol/L (ref 96–106)
Creatinine, Ser: 1.72 mg/dL — ABNORMAL HIGH (ref 0.76–1.27)
GFR calc Af Amer: 40 mL/min/{1.73_m2} — ABNORMAL LOW (ref >59)
GFR calc non Af Amer: 35 mL/min/{1.73_m2} — ABNORMAL LOW (ref >59)
Globulin, Total: 2.9 g/dL (ref 1.5–4.5)
Glucose: 122 mg/dL — ABNORMAL HIGH (ref 65–99)
Potassium: 4.6 mmol/L (ref 3.5–5.2)
Sodium: 137 mmol/L (ref 134–144)
Total Protein: 7.1 g/dL (ref 6.0–8.5)

## 2019-03-29 ENCOUNTER — Encounter (HOSPITAL_COMMUNITY): Payer: Self-pay | Admitting: *Deleted

## 2019-03-29 ENCOUNTER — Other Ambulatory Visit: Payer: Self-pay

## 2019-03-29 ENCOUNTER — Inpatient Hospital Stay (HOSPITAL_COMMUNITY)
Admission: EM | Admit: 2019-03-29 | Discharge: 2019-04-01 | DRG: 603 | Disposition: A | Discharge status: Home Health | Payer: PPO | Attending: Internal Medicine | Admitting: Internal Medicine

## 2019-03-29 DIAGNOSIS — I480 Paroxysmal atrial fibrillation: Secondary | ICD-10-CM | POA: Diagnosis present

## 2019-03-29 DIAGNOSIS — Z86718 Personal history of other venous thrombosis and embolism: Secondary | ICD-10-CM | POA: Diagnosis not present

## 2019-03-29 DIAGNOSIS — G609 Hereditary and idiopathic neuropathy, unspecified: Secondary | ICD-10-CM | POA: Diagnosis not present

## 2019-03-29 DIAGNOSIS — Z79899 Other long term (current) drug therapy: Secondary | ICD-10-CM | POA: Diagnosis not present

## 2019-03-29 DIAGNOSIS — E785 Hyperlipidemia, unspecified: Secondary | ICD-10-CM | POA: Diagnosis not present

## 2019-03-29 DIAGNOSIS — F0150 Vascular dementia without behavioral disturbance: Secondary | ICD-10-CM | POA: Diagnosis present

## 2019-03-29 DIAGNOSIS — I130 Hypertensive heart and chronic kidney disease with heart failure and stage 1 through stage 4 chronic kidney disease, or unspecified chronic kidney disease: Secondary | ICD-10-CM | POA: Diagnosis not present

## 2019-03-29 DIAGNOSIS — Z791 Long term (current) use of non-steroidal anti-inflammatories (NSAID): Secondary | ICD-10-CM

## 2019-03-29 DIAGNOSIS — Z87891 Personal history of nicotine dependence: Secondary | ICD-10-CM

## 2019-03-29 DIAGNOSIS — Z03818 Encounter for observation for suspected exposure to other biological agents ruled out: Secondary | ICD-10-CM | POA: Diagnosis not present

## 2019-03-29 DIAGNOSIS — L97919 Non-pressure chronic ulcer of unspecified part of right lower leg with unspecified severity: Secondary | ICD-10-CM | POA: Diagnosis not present

## 2019-03-29 DIAGNOSIS — I2510 Atherosclerotic heart disease of native coronary artery without angina pectoris: Secondary | ICD-10-CM | POA: Diagnosis not present

## 2019-03-29 DIAGNOSIS — Z823 Family history of stroke: Secondary | ICD-10-CM | POA: Diagnosis not present

## 2019-03-29 DIAGNOSIS — N183 Chronic kidney disease, stage 3 unspecified: Secondary | ICD-10-CM | POA: Diagnosis present

## 2019-03-29 DIAGNOSIS — E7800 Pure hypercholesterolemia, unspecified: Secondary | ICD-10-CM | POA: Diagnosis not present

## 2019-03-29 DIAGNOSIS — I10 Essential (primary) hypertension: Secondary | ICD-10-CM | POA: Diagnosis present

## 2019-03-29 DIAGNOSIS — Z8673 Personal history of transient ischemic attack (TIA), and cerebral infarction without residual deficits: Secondary | ICD-10-CM | POA: Diagnosis not present

## 2019-03-29 DIAGNOSIS — I83893 Varicose veins of bilateral lower extremities with other complications: Secondary | ICD-10-CM | POA: Diagnosis present

## 2019-03-29 DIAGNOSIS — Z96652 Presence of left artificial knee joint: Secondary | ICD-10-CM | POA: Diagnosis not present

## 2019-03-29 DIAGNOSIS — L03115 Cellulitis of right lower limb: Secondary | ICD-10-CM | POA: Diagnosis not present

## 2019-03-29 DIAGNOSIS — E11622 Type 2 diabetes mellitus with other skin ulcer: Secondary | ICD-10-CM | POA: Diagnosis present

## 2019-03-29 DIAGNOSIS — L03119 Cellulitis of unspecified part of limb: Secondary | ICD-10-CM | POA: Diagnosis present

## 2019-03-29 DIAGNOSIS — E1129 Type 2 diabetes mellitus with other diabetic kidney complication: Secondary | ICD-10-CM | POA: Diagnosis not present

## 2019-03-29 DIAGNOSIS — Z7982 Long term (current) use of aspirin: Secondary | ICD-10-CM

## 2019-03-29 DIAGNOSIS — Z20828 Contact with and (suspected) exposure to other viral communicable diseases: Secondary | ICD-10-CM | POA: Diagnosis not present

## 2019-03-29 DIAGNOSIS — Z79890 Hormone replacement therapy (postmenopausal): Secondary | ICD-10-CM | POA: Diagnosis not present

## 2019-03-29 DIAGNOSIS — E1122 Type 2 diabetes mellitus with diabetic chronic kidney disease: Secondary | ICD-10-CM | POA: Diagnosis present

## 2019-03-29 DIAGNOSIS — L97929 Non-pressure chronic ulcer of unspecified part of left lower leg with unspecified severity: Secondary | ICD-10-CM | POA: Diagnosis not present

## 2019-03-29 DIAGNOSIS — D519 Vitamin B12 deficiency anemia, unspecified: Secondary | ICD-10-CM | POA: Diagnosis not present

## 2019-03-29 DIAGNOSIS — I5042 Chronic combined systolic (congestive) and diastolic (congestive) heart failure: Secondary | ICD-10-CM | POA: Diagnosis not present

## 2019-03-29 DIAGNOSIS — E039 Hypothyroidism, unspecified: Secondary | ICD-10-CM | POA: Diagnosis present

## 2019-03-29 DIAGNOSIS — L03116 Cellulitis of left lower limb: Secondary | ICD-10-CM | POA: Diagnosis present

## 2019-03-29 DIAGNOSIS — E538 Deficiency of other specified B group vitamins: Secondary | ICD-10-CM | POA: Diagnosis present

## 2019-03-29 DIAGNOSIS — Z96612 Presence of left artificial shoulder joint: Secondary | ICD-10-CM | POA: Diagnosis present

## 2019-03-29 LAB — CBC WITH DIFFERENTIAL/PLATELET
Abs Immature Granulocytes: 0.01 10*3/uL (ref 0.00–0.07)
Basophils Absolute: 0 10*3/uL (ref 0.0–0.1)
Basophils Relative: 1 %
Eosinophils Absolute: 0.2 10*3/uL (ref 0.0–0.5)
Eosinophils Relative: 3 %
HCT: 37.8 % — ABNORMAL LOW (ref 39.0–52.0)
Hemoglobin: 12.2 g/dL — ABNORMAL LOW (ref 13.0–17.0)
Immature Granulocytes: 0 %
Lymphocytes Relative: 36 %
Lymphs Abs: 1.9 10*3/uL (ref 0.7–4.0)
MCH: 29.6 pg (ref 26.0–34.0)
MCHC: 32.3 g/dL (ref 30.0–36.0)
MCV: 91.7 fL (ref 80.0–100.0)
Monocytes Absolute: 0.5 10*3/uL (ref 0.1–1.0)
Monocytes Relative: 10 %
Neutro Abs: 2.6 10*3/uL (ref 1.7–7.7)
Neutrophils Relative %: 50 %
Platelets: 228 10*3/uL (ref 150–400)
RBC: 4.12 MIL/uL — ABNORMAL LOW (ref 4.22–5.81)
RDW: 13.7 % (ref 11.5–15.5)
WBC: 5.2 10*3/uL (ref 4.0–10.5)
nRBC: 0 % (ref 0.0–0.2)

## 2019-03-29 LAB — BASIC METABOLIC PANEL
Anion gap: 12 (ref 5–15)
BUN: 34 mg/dL — ABNORMAL HIGH (ref 8–23)
CO2: 19 mmol/L — ABNORMAL LOW (ref 22–32)
Calcium: 8.9 mg/dL (ref 8.9–10.3)
Chloride: 103 mmol/L (ref 98–111)
Creatinine, Ser: 1.67 mg/dL — ABNORMAL HIGH (ref 0.61–1.24)
GFR calc Af Amer: 42 mL/min — ABNORMAL LOW (ref >60)
GFR calc non Af Amer: 36 mL/min — ABNORMAL LOW (ref >60)
Glucose, Bld: 105 mg/dL — ABNORMAL HIGH (ref 70–99)
Potassium: 4.2 mmol/L (ref 3.5–5.1)
Sodium: 134 mmol/L — ABNORMAL LOW (ref 135–145)

## 2019-03-29 LAB — SARS CORONAVIRUS 2 BY RT PCR (HOSPITAL ORDER, PERFORMED IN ~~LOC~~ HOSPITAL LAB): SARS Coronavirus 2: NEGATIVE

## 2019-03-29 MED ORDER — FUROSEMIDE 10 MG/ML IJ SOLN
60.0000 mg | Freq: Two times a day (BID) | INTRAMUSCULAR | Status: DC
  Administered 2019-03-30: 08:00:00 60 mg via INTRAVENOUS
  Filled 2019-03-29: qty 6

## 2019-03-29 MED ORDER — PIPERACILLIN-TAZOBACTAM 3.375 G IVPB
3.3750 g | Freq: Three times a day (TID) | INTRAVENOUS | Status: DC
  Administered 2019-03-30 – 2019-04-01 (×8): 3.375 g via INTRAVENOUS
  Filled 2019-03-29 (×8): qty 50

## 2019-03-29 MED ORDER — VANCOMYCIN HCL IN DEXTROSE 750-5 MG/150ML-% IV SOLN
750.0000 mg | INTRAVENOUS | Status: DC
  Administered 2019-03-31: 22:00:00 750 mg via INTRAVENOUS
  Filled 2019-03-29: qty 150

## 2019-03-29 MED ORDER — PIPERACILLIN-TAZOBACTAM 3.375 G IVPB 30 MIN
3.3750 g | Freq: Once | INTRAVENOUS | Status: AC
  Administered 2019-03-29: 20:00:00 3.375 g via INTRAVENOUS
  Filled 2019-03-29: qty 50

## 2019-03-29 MED ORDER — ENOXAPARIN SODIUM 30 MG/0.3ML ~~LOC~~ SOLN
30.0000 mg | SUBCUTANEOUS | Status: DC
  Administered 2019-03-30 – 2019-04-01 (×3): 30 mg via SUBCUTANEOUS
  Filled 2019-03-29 (×3): qty 0.3

## 2019-03-29 MED ORDER — VANCOMYCIN HCL 10 G IV SOLR
1500.0000 mg | Freq: Once | INTRAVENOUS | Status: AC
  Administered 2019-03-29: 22:00:00 1500 mg via INTRAVENOUS
  Filled 2019-03-29: qty 1500

## 2019-03-29 NOTE — Progress Notes (Signed)
Pharmacy Antibiotic Note  [name] Elliott is a 85 y.o. male admitted on 03/29/2019 with cellulitis.  Pharmacy has been consulted for vancomycin and zosyn dosing. Pt is afebrile and WBC is WNL. SCr is elevated at 1.67. Pt was on doxycycline and amoxicillin PTA.   Plan: Vancomycin 1500mg  IV x 1 then 750mg  IV Q48H Zosyn 3.375gm IV Q8H (4 hr inf) F/u renal fxn, C&S, clinical status and peak/trough at SS  Height: 4\' 11"  (149.9 cm) Weight: 167 lb 15.9 oz (76.2 kg) IBW/kg (Calculated) : 47.7  Temp (24hrs), Avg:97.9 F (36.6 C), Min:97.6 F (36.4 C), Max:98.1 F (36.7 C)  Recent Labs  Lab 03/25/19 1230 03/29/19 1934  WBC 8.1 5.2  CREATININE 1.72* 1.67*    Estimated Creatinine Clearance: 25.6 mL/min (A) (by C-G formula based on SCr of 1.67 mg/dL (H)).    Allergies  Allergen Reactions  . Crestor [Rosuvastatin Calcium] Other (See Comments)    Muscle pain/aches  . Mestinon [Pyridostigmine Bromide Er] Other (See Comments)    GI upset, stomach cramps    Antimicrobials this admission: Vanc 7/26>> Zosyn 7/26>>  Dose adjustments this admission: N/A  Microbiology results: Pending  Thank you for allowing pharmacy to be a part of this patient's care.  Gurtha Picker, Rande Lawman 03/29/2019 7:20 PM

## 2019-03-29 NOTE — ED Triage Notes (Signed)
Pt was seen on Wed for redness and swelling to bil LE.  They were marked by physician.  R lower ankle weeping and swollen.  LLE red and swollen.

## 2019-03-29 NOTE — ED Notes (Signed)
ED TO INPATIENT HANDOFF REPORT  ED Nurse Name and Phone #: Annamary Carolin Pass Christian  S Name/Age/Gender [name] Elliott 85 y.o. male Room/Bed: TRAAC/TRAAC  Code Status   Code Status: Prior  Home/SNF/Other Home Patient oriented to:  Is this baseline? Yes   Triage Complete: Triage complete  Chief Complaint cellulitis right leg  Triage Note Pt was seen on Wed for redness and swelling to bil LE.  They were marked by physician.  R lower ankle weeping and swollen.  LLE red and swollen.   Allergies Allergies  Allergen Reactions  . Crestor [Rosuvastatin Calcium] Other (See Comments)    Muscle pain/aches  . Mestinon [Pyridostigmine Bromide Er] Other (See Comments)    GI upset, stomach cramps    Level of Care/Admitting Diagnosis ED Disposition    ED Disposition Condition [facility] Area: [place] [[id number]]  Level of Care: Med-Surg [16]  Covid Evaluation: Confirmed COVID Negative  Diagnosis: Cellulitis, leg [409811]  Admitting Physician: Bonnell Public [[id number]]  Attending Physician: Dana Allan I [[id number]]  Estimated length of stay: past midnight tomorrow  Certification:: I certify this patient will need inpatient services for at least 2 midnights  PT Class (Do Not Modify): Inpatient [101]  PT Acc Code (Do Not Modify): Private [1]       B Medical/Surgery History Past Medical History:  Diagnosis Date  . Arthritis   . Arthropathy, unspecified, site unspecified   . Atrial fibrillation ([place])   . Blood dyscrasia    thromboctopenia pt family states he was never told of this  . CAD (coronary artery disease)    last cath in 2012. Managed medically-some blockages  . Chronic lower back pain   . Chronic systolic CHF (congestive heart failure) ([place]) 06/04/2016  . Constipation, chronic   . Diabetes mellitus without complication ([place])    pt states he doesn't have  . DVT (deep venous thrombosis) ([place])   . Dysrhythmia   . Esophageal reflux    . 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 MRI Nov '11 - tendonosis and partial tear. Left shoulder surgery Jan '12 for partial shoulder replacement. [name])   . Headache(784.0)   . History of stomach ulcers 11/2012  . Hypothyroidism    pt states he doeen't have   . Orthostasis   . Other B-complex deficiencies   . Other malaise and fatigue   . Pain in joint, shoulder region    Has chronic shoulder pain  . Pain in limb   . Persistent disorder of initiating or maintaining sleep   . Pneumonia 1980's?; 2011  . Primary localized osteoarthritis of left knee 07/12/2015  . Pure hypercholesterolemia   . PVC's (premature ventricular contractions)   . S/P cardiac cath 08/08/11   mild to moderate CAD primarily in the LAD. None are obstructive and appear stable from prior cath in 2007; managed medically  . Sebaceous cyst   . Stroke ([place])   . Thrombocytopenia, unspecified ([place])   . Unspecified essential hypertension   . Vascular dementia without behavioral disturbance ([facility])    with periods of amnesia   Past Surgical History:  Procedure Laterality Date  . BACK SURGERY    . CARDIAC CATHETERIZATION  08/2011  . CATARACT EXTRACTION Left 2009  . COLONOSCOPY    . ESOPHAGOGASTRODUODENOSCOPY N/A 11/19/2012   Procedure: ESOPHAGOGASTRODUODENOSCOPY (EGD);  Surgeon: Jerene Bears, MD;  Location: [facility];  Service: Gastroenterology;  Laterality: N/A;  . FINGER AMPUTATION Right  pinky finger  . HAND SURGERY Right    crush injury, right fifth digit contracture, limited  motion  . HARDWARE REMOVAL  02/05/2012   Procedure: HARDWARE REMOVAL;  Surgeon: Johnny Bridge, MD;  Location: [facility];  Service: Orthopedics;  Laterality: Left;  . KNEE SURGERY Left 1991   "did it twice in 1 wk" (02/17/2013)  . LUMBAR SPINE SURGERY  2007   Dr Philip Aspen ([place])  . REVERSE SHOULDER ARTHROPLASTY  02/05/2012   Procedure: REVERSE SHOULDER ARTHROPLASTY;  Surgeon: Johnny Bridge, MD;   Location: [facility];  Service: Orthopedics;  Laterality: Left;  . SHOULDER HEMI-ARTHROPLASTY    . SHOULDER OPEN ROTATOR CUFF REPAIR Left 8.30.2011  . TOTAL KNEE ARTHROPLASTY Left 07/12/2015   Procedure: TOTAL KNEE ARTHROPLASTY;  Surgeon: Marchia Bond, MD;  Location: [facility];  Service: Orthopedics;  Laterality: Left;  . US ECHOCARDIOGRAPHY  02-28-2010   Est EF 50-55%     A IV Location/Drains/Wounds Patient Lines/Drains/Airways Status   Active Line/Drains/Airways    Name:   Placement date:   Placement time:   Site:   Days:   Peripheral IV 03/29/19 Right Antecubital   03/29/19    1946    Antecubital   less than 1          Intake/Output Last 24 hours No intake or output data in the 24 hours ending 03/29/19 2326  Labs/Imaging Results for orders placed or performed during the hospital encounter of 03/29/19 (from the past 48 hour(s))  Basic metabolic panel     Status: Abnormal   Collection Time: 03/29/19  7:34 PM  Result Value Ref Range   Sodium 134 (L) 135 - 145 mmol/L   Potassium 4.2 3.5 - 5.1 mmol/L   Chloride 103 98 - 111 mmol/L   CO2 19 (L) 22 - 32 mmol/L   Glucose, Bld 105 (H) 70 - 99 mg/dL   BUN 34 (H) 8 - 23 mg/dL   Creatinine, Ser 1.67 (H) 0.61 - 1.24 mg/dL   Calcium 8.9 8.9 - 10.3 mg/dL   GFR calc non Af Amer 36 (L) >60 mL/min   GFR calc Af Amer 42 (L) >60 mL/min   Anion gap 12 5 - 15    Comment: Performed at [facility], [address] [name], [place] [address]  CBC with Differential     Status: Abnormal   Collection Time: 03/29/19  7:34 PM  Result Value Ref Range   WBC 5.2 4.0 - 10.5 K/uL   RBC 4.12 (L) 4.22 - 5.81 MIL/uL   Hemoglobin 12.2 (L) 13.0 - 17.0 g/dL   HCT 37.8 (L) 39.0 - 52.0 %   MCV 91.7 80.0 - 100.0 fL   MCH 29.6 26.0 - 34.0 pg   MCHC 32.3 30.0 - 36.0 g/dL   RDW 13.7 11.5 - 15.5 %   Platelets 228 150 - 400 K/uL   nRBC 0.0 0.0 - 0.2 %   Neutrophils Relative % 50 %   Neutro Abs 2.6 1.7 - 7.7 K/uL   Lymphocytes Relative 36 %   Lymphs Abs 1.9  0.7 - 4.0 K/uL   Monocytes Relative 10 %   Monocytes Absolute 0.5 0.1 - 1.0 K/uL   Eosinophils Relative 3 %   Eosinophils Absolute 0.2 0.0 - 0.5 K/uL   Basophils Relative 1 %   Basophils Absolute 0.0 0.0 - 0.1 K/uL   Immature Granulocytes 0 %   Abs Immature Granulocytes 0.01 0.00 - 0.07 K/uL    Comment: Performed  at [facility], [place] [address], [place], [place] [address]  SARS Coronavirus 2 (CEPHEID - Performed in [facility] lab), Hosp Order     Status: None   Collection Time: 03/29/19  7:34 PM   Specimen: Nasopharyngeal Swab  Result Value Ref Range   SARS Coronavirus 2 NEGATIVE NEGATIVE    Comment: (NOTE) If result is NEGATIVE SARS-CoV-2 target nucleic acids are NOT DETECTED. The SARS-CoV-2 RNA is generally detectable in upper and lower  respiratory specimens during the acute phase of infection. The lowest  concentration of SARS-CoV-2 viral copies this assay can detect is 250  copies / mL. A negative result does not preclude SARS-CoV-2 infection  and should not be used as the sole basis for treatment or other  patient management decisions.  A negative result may occur with  improper specimen collection / handling, submission of specimen other  than nasopharyngeal swab, presence of viral mutation(s) within the  areas targeted by this assay, and inadequate number of viral copies  (<250 copies / mL). A negative result must be combined with clinical  observations, patient history, and epidemiological information. If result is POSITIVE SARS-CoV-2 target nucleic acids are DETECTED. The SARS-CoV-2 RNA is generally detectable in upper and lower  respiratory specimens dur ing the acute phase of infection.  Positive  results are indicative of active infection with SARS-CoV-2.  Clinical  correlation with patient history and other diagnostic information is  necessary to determine patient infection status.  Positive results do  not rule out bacterial infection or  co-infection with other viruses. If result is PRESUMPTIVE POSTIVE SARS-CoV-2 nucleic acids MAY BE PRESENT.   A presumptive positive result was obtained on the submitted specimen  and confirmed on repeat testing.  While 2019 novel coronavirus  (SARS-CoV-2) nucleic acids may be present in the submitted sample  additional confirmatory testing may be necessary for epidemiological  and / or clinical management purposes  to differentiate between  SARS-CoV-2 and other Sarbecovirus currently known to infect humans.  If clinically indicated additional testing with an alternate test  methodology [id number]) is advised. The SARS-CoV-2 RNA is generally  detectable in upper and lower respiratory sp ecimens during the acute  phase of infection. The expected result is Negative. Fact Sheet for Patients:  StrictlyIdeas.no Fact Sheet for Healthcare Providers: BankingDealers.co.za This test is not yet approved or cleared by the [place] FDA and has been authorized for detection and/or diagnosis of SARS-CoV-2 by FDA under an Emergency Use Authorization (EUA).  This EUA will remain in effect (meaning this test can be used) for the duration of the COVID-19 declaration under Section 564(b)(1) of the Act, 21 U.S.C. section 360bbb-3(b)(1), unless the authorization is terminated or revoked sooner. Performed at [facility], [place] [address] [place], [place] [address]   Blood culture (routine x 2)     Status: None (Preliminary result)   Collection Time: 03/29/19  7:50 PM   Specimen: BLOOD  Result Value Ref Range   Specimen Description BLOOD LEFT ANTECUBITAL    Special Requests      BOTTLES DRAWN AEROBIC AND ANAEROBIC Blood Culture adequate volume Performed at [facility], [place] [address] [place],  [address]    Culture PENDING    Report Status PENDING    No results found.  Pending Labs Unresulted Labs (From admission, onward)     Start     Ordered   03/29/19 1910  Blood culture (routine x 2)  BLOOD CULTURE  X 2,   STAT     03/29/19 1912   Signed and Held  CBC  (enoxaparin (LOVENOX)    CrCl >/= 30 ml/min)  Once,   R    Comments: Baseline for enoxaparin therapy IF NOT ALREADY DRAWN.  Notify MD if PLT < 100 K.    Signed and Held   Signed and Held  Creatinine, serum  (enoxaparin (LOVENOX)    CrCl >/= 30 ml/min)  Once,   R    Comments: Baseline for enoxaparin therapy IF NOT ALREADY DRAWN.    Signed and Held   Signed and Held  Creatinine, serum  (enoxaparin (LOVENOX)    CrCl >/= 30 ml/min)  Weekly,   R    Comments: while on enoxaparin therapy    Signed and Held   Signed and Held  Magnesium  Once,   R     Signed and Held   Signed and Held  Phosphorus  Once,   R     Signed and Held   Signed and Held  TSH  Once,   R     Signed and Held   Signed and Held  Urinalysis, Complete w Microscopic  Once,   R     Signed and Held   Visual merchandiser and Occupational hygienist morning,   R     Signed and Held   Signed and Held  CBC  Tomorrow morning,   R     Signed and Held          Vitals/Pain Today's Vitals   03/29/19 2200 03/29/19 2215 03/29/19 2230 03/29/19 2315  BP:  (!) 112/57 116/61 (!) 119/56  Pulse:  (!) 52 (!) 51 (!) 56  Resp:  (!) 29 17 13   Temp:      TempSrc:      SpO2:  97% 96% 98%  Weight:      Height:      PainSc: 0-No pain       Isolation Precautions No active isolations  Medications Medications  vancomycin (VANCOCIN) 1,500 mg in sodium chloride 0.9 % 500 mL IVPB (1,500 mg Intravenous New Bag/Given 03/29/19 2201)  vancomycin (VANCOCIN) IVPB 750 mg/150 ml premix (has no administration in time range)  piperacillin-tazobactam (ZOSYN) IVPB 3.375 g (has no administration in time range)  piperacillin-tazobactam (ZOSYN) IVPB 3.375 g (0 g Intravenous Stopped 03/29/19 2202)    Mobility walks with device Low fall risk   Focused Assessments    R Recommendations: See Admitting Provider  Note  Report given to:   Additional Notes:

## 2019-03-29 NOTE — ED Provider Notes (Signed)
[facility] EMERGENCY DEPARTMENT Provider Note   CSN: [id number] Arrival date & time: 03/29/19  1814    History   Chief Complaint Chief Complaint  Patient presents with  . Recurrent Skin Infections    HPI [name] Elliott is a 85 y.o. male with a past medical history of CAD, CHF, prior CVA who presents to ED for cellulitis of his right lower extremity.  States his symptoms began on 03/25/2019.  He is unable to recall any event that may have triggered these symptoms.  States that there has been purulent drainage coming from his wound and increase in redness.  He was initially evaluated by his PCP when symptoms began and was prescribed doxycycline and amoxicillin.  He has been taking these medications as prescribed but has progressively worsening redness and pain at the site.  He denies any fevers, chills, cough, sick contacts, vomiting or chest pain.     HPI  Past Medical History:  Diagnosis Date  . Arthritis   . Arthropathy, unspecified, site unspecified   . Atrial fibrillation ([place])   . Blood dyscrasia    thromboctopenia pt family states he was never told of this  . CAD (coronary artery disease)    last cath in 2012. Managed medically-some blockages  . Chronic lower back pain   . Chronic systolic CHF (congestive heart failure) ([place]) 06/04/2016  . Constipation, chronic   . Diabetes mellitus without complication ([place])    pt states he doesn't have  . DVT (deep venous thrombosis) ([place])   . Dysrhythmia   . Esophageal reflux   . 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 MRI Nov '11 - tendonosis and partial tear. Left shoulder surgery Jan '12 for partial shoulder replacement. [name])   . Headache(784.0)   . History of stomach ulcers 11/2012  . Hypothyroidism    pt states he doeen't have   . Orthostasis   . Other B-complex deficiencies   . Other malaise and fatigue   . Pain in joint, shoulder region    Has chronic shoulder pain  . Pain  in limb   . Persistent disorder of initiating or maintaining sleep   . Pneumonia 1980's?; 2011  . Primary localized osteoarthritis of left knee 07/12/2015  . Pure hypercholesterolemia   . PVC's (premature ventricular contractions)   . S/P cardiac cath 08/08/11   mild to moderate CAD primarily in the LAD. None are obstructive and appear stable from prior cath in 2007; managed medically  . Sebaceous cyst   . Stroke ([place])   . Thrombocytopenia, unspecified ([place])   . Unspecified essential hypertension   . Vascular dementia without behavioral disturbance ([place])    with periods of amnesia    Patient Active Problem List   Diagnosis Date Noted  . Memory loss 12/05/2017  . Orthostatic dizziness 12/05/2017  . Idiopathic peripheral neuropathy 12/05/2017  . PAF (paroxysmal atrial fibrillation) ([place]) 04/30/2017  . Shortness of breath 06/04/2016  . Chronic systolic CHF (congestive heart failure) ([place]) 06/04/2016  . Bilateral leg edema 12/12/2015  . Dizziness 09/27/2015  . Primary localized osteoarthritis of left knee 07/12/2015  . Weakness 02/23/2015  . DVT (deep venous thrombosis) ([place]) 12/14/2014  . Combined congestive systolic and diastolic heart failure ([place]) 12/14/2014  . Cerebral infarction due to unspecified mechanism   . TIA (transient ischemic attack) 08/01/2014  . Varicose veins of both legs with edema 05/07/2014  . Bilateral leg pain 04/16/2014  . Chronic kidney disease, stage III (moderate) ([facility])  12/21/2013  . Vascular dementia without behavioral disturbance ([place])   . Type 2 diabetes mellitus, controlled, with renal complications ([place]) 02/63/7858  . Diastolic dysfunction 85/10/7739  . Knee pain, bilateral 03/01/2013  . Hereditary and idiopathic peripheral neuropathy 11/26/2012  . Osteoporosis, unspecified 11/26/2012  . GI bleed due to NSAIDs, DDX=Isch colitis, infectious colitis 11/19/2012  . Cough 05/29/2012  . 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  CONSTIPATION, CHRONIC 08/02/2010  . INSOMNIA, CHRONIC 03/08/2010  . Unspecified hypothyroidism 01/04/2010  . VITAMIN B12 DEFICIENCY 01/04/2010  . HYPERCHOLESTEROLEMIA 08/12/2009  . Essential hypertension 08/12/2009  . CAD (coronary artery disease) 08/12/2009  . GERD 08/12/2009    Past Surgical History:  Procedure Laterality Date  . BACK SURGERY    . CARDIAC CATHETERIZATION  08/2011  . CATARACT EXTRACTION Left 2009  . COLONOSCOPY    . ESOPHAGOGASTRODUODENOSCOPY N/A 11/19/2012   Procedure: ESOPHAGOGASTRODUODENOSCOPY (EGD);  Surgeon: Jerene Bears, MD;  Location: [facility];  Service: Gastroenterology;  Laterality: N/A;  . FINGER AMPUTATION Right    pinky finger  . HAND SURGERY Right    crush injury, right fifth digit contracture, limited  motion  . HARDWARE REMOVAL  02/05/2012   Procedure: HARDWARE REMOVAL;  Surgeon: Johnny Bridge, MD;  Location: [facility];  Service: Orthopedics;  Laterality: Left;  . KNEE SURGERY Left 1991   "did it twice in 1 wk" (02/17/2013)  . LUMBAR SPINE SURGERY  2007   Dr Philip Aspen ([place])  . REVERSE SHOULDER ARTHROPLASTY  02/05/2012   Procedure: REVERSE SHOULDER ARTHROPLASTY;  Surgeon: Johnny Bridge, MD;  Location: [facility];  Service: Orthopedics;  Laterality: Left;  . SHOULDER HEMI-ARTHROPLASTY    . SHOULDER OPEN ROTATOR CUFF REPAIR Left 8.30.2011  . TOTAL KNEE ARTHROPLASTY Left 07/12/2015   Procedure: TOTAL KNEE ARTHROPLASTY;  Surgeon: Marchia Bond, MD;  Location: [facility];  Service: Orthopedics;  Laterality: Left;  . US ECHOCARDIOGRAPHY  02-28-2010   Est EF 50-55%        Home Medications    Prior to Admission medications   Medication Sig Start Date End Date Taking? Authorizing Provider  amoxicillin (AMOXIL) 500 MG capsule Take 1 capsule (500 mg total) by mouth 2 (two) times daily. 03/25/19   Maryruth Hancock, MD  aspirin EC 81 MG tablet Take 81 mg by mouth daily.     [provider]  bismuth subsalicylate (PEPTO BISMOL) 262 MG/15ML suspension  Take 30 mLs by mouth daily as needed for indigestion or diarrhea or loose stools.     [provider]  diclofenac sodium (VOLTAREN) 1 % GEL Apply 2 g topically 4 (four) times daily. 08/04/18   Rutherford Guys, MD  docusate sodium (COLACE) 100 MG capsule Take 1 capsule (100 mg total) by mouth every 12 (twelve) hours. 07/29/18   Margarita Mail, PA-C  doxycycline (VIBRA-TABS) 100 MG tablet Take 1 tablet (100 mg total) by mouth 2 (two) times daily. 03/25/19   Corum, Rex Kras, MD  furosemide (LASIX) 40 MG tablet Take 1 tablet (40 mg total) by mouth daily. 07/03/18   Nahser, Wonda Cheng, MD  gabapentin (NEURONTIN) 300 MG capsule TAKE 2 CAPSULES BY MOUTH THREE TIMES DAILY 02/16/19   Rutherford Guys, MD  isosorbide mononitrate (IMDUR) 30 MG 24 hr tablet Take 1 tablet (30 mg total) by mouth daily. 10/02/18   Nahser, Wonda Cheng, MD  levothyroxine (SYNTHROID) 50 MCG tablet Take 1 tablet (50 mcg total) by mouth every other day. 30 minutes before breakfast. Alternated  with 49mcg tablet. 03/10/19   Rutherford Guys, MD  levothyroxine (SYNTHROID) 75 MCG tablet Take 1 tablet (75 mcg total) by mouth every other day. 30 minutes before breakfast. Alternate with 86mcg tablet. 03/10/19   Rutherford Guys, MD  lidocaine (LIDODERM) 5 % Place 1 patch onto the skin daily. Remove & Discard patch within 12 hours or as directed by MD 09/04/18   Rutherford Guys, MD  magnesium hydroxide (MILK OF MAGNESIA) 400 MG/5ML suspension Take 15 mLs by mouth daily as needed for mild constipation or moderate constipation.    [provider]  nitroGLYCERIN (NITROSTAT) 0.4 MG SL tablet Place 1 tablet (0.4 mg total) under the tongue every 5 (five) minutes as needed for chest pain. 07/03/18   Nahser, Wonda Cheng, MD  omeprazole (PRILOSEC) 20 MG capsule Take 1 capsule (20 mg total) by mouth 2 (two) times daily before a meal. 07/01/18   McVey, Gelene Mink, PA-C  potassium chloride SA (K-DUR) 20 MEQ tablet Take 1 tablet (20 mEq total) by mouth  2 (two) times daily. 03/25/19   Corum, Rex Kras, MD    Family History Family History  Problem Relation Age of Onset  . Breast cancer Mother   . Cancer Mother   . Diabetes Mother   . Cancer Brother        throat  . Stroke Brother   . Kidney disease Father   . Diabetes Sister   . Diabetes Daughter   . Colon cancer Neg Hx   . Heart attack Neg Hx     Social History Social History   Tobacco Use  . Smoking status: Former Smoker    Types: Cigars    Quit date: 09/03/1978    Years since quitting: 40.5  . Smokeless tobacco: Current User    Types: Chew  . Tobacco comment: 02/17/2013 "I aien't smoked a cigar in 20-30 yr"  Substance Use Topics  . Alcohol use: No    Alcohol/week: 0.0 standard drinks  . Drug use: No     Allergies   Crestor [rosuvastatin calcium] and Mestinon [pyridostigmine bromide er]   Review of Systems Review of Systems  Constitutional: Negative for appetite change, chills and fever.  HENT: Negative for ear pain, rhinorrhea, sneezing and sore throat.   Eyes: Negative for photophobia and visual disturbance.  Respiratory: Negative for cough, chest tightness, shortness of breath and wheezing.   Cardiovascular: Negative for chest pain and palpitations.  Gastrointestinal: Negative for abdominal pain, blood in stool, constipation, diarrhea, nausea and vomiting.  Genitourinary: Negative for dysuria, hematuria and urgency.  Musculoskeletal: Negative for myalgias.  Skin: Positive for color change and wound. Negative for rash.  Neurological: Negative for dizziness, weakness and light-headedness.     Physical Exam Updated Vital Signs BP 105/62 (BP Location: Right Arm)   Pulse 61   Temp 97.6 F (36.4 C) (Oral)   Resp 16   Ht 4\' 11"  (1.499 m)   Wt 76.2 kg   SpO2 100%   BMI 33.93 kg/m   Physical Exam Vitals signs and nursing note reviewed.  Constitutional:      General: He is not in acute distress.    Appearance: He is well-developed.  HENT:     Head:  Normocephalic and atraumatic.     Nose: Nose normal.  Eyes:     General: No scleral icterus.       Left eye: No discharge.     Conjunctiva/sclera: Conjunctivae normal.  Neck:     Musculoskeletal:  Normal range of motion and neck supple.  Cardiovascular:     Rate and Rhythm: Normal rate and regular rhythm.     Heart sounds: Normal heart sounds. No murmur. No friction rub. No gallop.   Pulmonary:     Effort: Pulmonary effort is normal. No respiratory distress.     Breath sounds: Normal breath sounds.  Abdominal:     General: Bowel sounds are normal. There is no distension.     Palpations: Abdomen is soft.     Tenderness: There is no abdominal tenderness. There is no guarding.  Musculoskeletal: Normal range of motion.  Skin:    General: Skin is warm and dry.     Findings: Erythema present. No rash.     Comments: Erythema noted on right shin with wound as noted in image. 2+ DP pulses bilaterally.  Neurological:     Mental Status: He is alert.     Motor: No abnormal muscle tone.     Coordination: Coordination normal.        ED Treatments / Results  Labs (all labs ordered are listed, but only abnormal results are displayed) Labs Reviewed  BASIC METABOLIC PANEL - Abnormal; Notable for the following components:      Result Value   Sodium 134 (*)    CO2 19 (*)    Glucose, Bld 105 (*)    BUN 34 (*)    Creatinine, Ser 1.67 (*)    GFR calc non Af Amer 36 (*)    GFR calc Af Amer 42 (*)    All other components within normal limits  CBC WITH DIFFERENTIAL/PLATELET - Abnormal; Notable for the following components:   RBC 4.12 (*)    Hemoglobin 12.2 (*)    HCT 37.8 (*)    All other components within normal limits  SARS CORONAVIRUS 2 (HOSPITAL ORDER, [facility])  CULTURE, BLOOD (ROUTINE X 2)  CULTURE, BLOOD (ROUTINE X 2)    EKG None  Radiology No results found.  Procedures Procedures (including critical care time)  Medications Ordered in ED  Medications  vancomycin (VANCOCIN) 1,500 mg in sodium chloride 0.9 % 500 mL IVPB (has no administration in time range)  vancomycin (VANCOCIN) IVPB 750 mg/150 ml premix (has no administration in time range)  piperacillin-tazobactam (ZOSYN) IVPB 3.375 g (has no administration in time range)  piperacillin-tazobactam (ZOSYN) IVPB 3.375 g (3.375 g Intravenous New Bag/Given 03/29/19 1952)     Initial Impression / Assessment and Plan / ED Course  I have reviewed the triage vital signs and the nursing notes.  Pertinent labs & imaging results that were available during my care of the patient were reviewed by me and considered in my medical decision making (see chart for details).        85 year old male with a past medical history of CAD, CHF, prior CVA presents to ED for cellulitis of right lower extremity.  States that symptoms began on 03/25/2019.  Unable to recall what caused his symptoms.  He has had purulent drainage coming from wound.  Saw his PCP when symptoms began and was started on doxycycline and amoxicillin.  Despite this, redness has spread and continues to have swelling and pain.  Physical exam findings as noted above.  Large wound with surrounding erythema.  Area is neurovascularly intact.  Vital signs are within normal limits, he does not appear septic.  Due to his failed outpatient therapy, feel that he will benefit from IV antibiotics for what appears to  be cellulitis.  Consider DVT however vascular ultrasound not available at this time.  May need to consider during admission as he has a history of DVT.  Final Clinical Impressions(s) / ED Diagnoses   Final diagnoses:  Cellulitis of right lower extremity    ED Discharge Orders    None       Delia Heady, PA-C 03/29/19 2058    Sherwood Gambler, MD 04/02/19 [phone]

## 2019-03-29 NOTE — H&P (Signed)
History and Physical  [name] Elliott FTD:[id number] DOB: [birth year] DOA: 03/29/2019  Referring physician: ER provider PCP: Rutherford Guys, MD  Outpatient Specialists:    Patient coming from: Home  Chief Complaint: Cellulitis of lower extremities  HPI: Patient is an 85 year old Caucasian male with multiple medical and cardiac history.  Patient carries diagnosis of vascular dementia, chronic kidney disease stage III, hypertension, thrombocytopenia, CVA, hyperlipidemia, PVCs, DVT, diabetes mellitus, diastolic congestive heart failure, atrial fibrillation and coronary artery disease amongst other medical and cardiac problems.  Patient has been on amoxicillin and doxycycline for cellulitis of bilateral lower extremities without any significant improvement.  Patient presents with bilateral lower extremity cellulitis that has not responded to oral antibiotics on outpatient basis.  Patient's ability to give history is limited due to the dementing process.  Specifically, patient denies fever, chills, headache, neck pain, URI symptoms, chest pain, shortness of breath, GI symptoms or urinary symptoms.  Patient be admitted for further assessment and management of bilateral cellulitis of lower extremities that did not respond to oral antibiotics on outpatient basis.  ED Course: On presentation to the hospital, temperature was 97.6, blood pressure of 116/61, heart rate of 51 with respiratory rate of 17 and O2 sat of 96%.  Sodium is 134, BUN of 34 and creatinine of 1.67 and CO2 of 19.  Blood sugars 105.  Pertinent labs: Chemistry reveals sodium of 134, potassium of 4, chloride 103, CO2 of 19, BUN of 34, creatinine of 1.67 with blood sugar of 105.  CBC reveals WBC of 5.3, hemoglobin of 12.2, hematocrit of 37.8, MCV of 91.7 platelet count of 228.  Review of Systems:  Negative for fever, visual changes, sore throat, new muscle aches, chest pain, SOB, dysuria, bleeding, n/v/abdominal pain.  Past Medical History:   Diagnosis Date  . Arthritis   . Arthropathy, unspecified, site unspecified   . Atrial fibrillation ([place])   . Blood dyscrasia    thromboctopenia pt family states he was never told of this  . CAD (coronary artery disease)    last cath in 2012. Managed medically-some blockages  . Chronic lower back pain   . Chronic systolic CHF (congestive heart failure) ([place]) 06/04/2016  . Constipation, chronic   . Diabetes mellitus without complication ([place])    pt states he doesn't have  . DVT (deep venous thrombosis) ([employer])   . Dysrhythmia   . Esophageal reflux   . 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 MRI Nov '11 - tendonosis and partial tear. Left shoulder surgery Jan '12 for partial shoulder replacement. [name])   . Headache(784.0)   . History of stomach ulcers 11/2012  . Hypothyroidism    pt states he doeen't have   . Orthostasis   . Other B-complex deficiencies   . Other malaise and fatigue   . Pain in joint, shoulder region    Has chronic shoulder pain  . Pain in limb   . Persistent disorder of initiating or maintaining sleep   . Pneumonia 1980's?; 2011  . Primary localized osteoarthritis of left knee 07/12/2015  . Pure hypercholesterolemia   . PVC's (premature ventricular contractions)   . S/P cardiac cath 08/08/11   mild to moderate CAD primarily in the LAD. None are obstructive and appear stable from prior cath in 2007; managed medically  . Sebaceous cyst   . Stroke ([place])   . Thrombocytopenia, unspecified ([place])   . Unspecified essential hypertension   . Vascular dementia without behavioral disturbance ([place])    with  periods of amnesia    Past Surgical History:  Procedure Laterality Date  . BACK SURGERY    . CARDIAC CATHETERIZATION  08/2011  . CATARACT EXTRACTION Left 2009  . COLONOSCOPY    . ESOPHAGOGASTRODUODENOSCOPY N/A 11/19/2012   Procedure: ESOPHAGOGASTRODUODENOSCOPY (EGD);  Surgeon: Jerene Bears, MD;  Location: [facility];  Service:  Gastroenterology;  Laterality: N/A;  . FINGER AMPUTATION Right    pinky finger  . HAND SURGERY Right    crush injury, right fifth digit contracture, limited  motion  . HARDWARE REMOVAL  02/05/2012   Procedure: HARDWARE REMOVAL;  Surgeon: Johnny Bridge, MD;  Location: [facility];  Service: Orthopedics;  Laterality: Left;  . KNEE SURGERY Left 1991   "did it twice in 1 wk" (02/17/2013)  . LUMBAR SPINE SURGERY  2007   Dr Philip Aspen ([place])  . REVERSE SHOULDER ARTHROPLASTY  02/05/2012   Procedure: REVERSE SHOULDER ARTHROPLASTY;  Surgeon: Johnny Bridge, MD;  Location: [facility];  Service: Orthopedics;  Laterality: Left;  . SHOULDER HEMI-ARTHROPLASTY    . SHOULDER OPEN ROTATOR CUFF REPAIR Left 8.30.2011  . TOTAL KNEE ARTHROPLASTY Left 07/12/2015   Procedure: TOTAL KNEE ARTHROPLASTY;  Surgeon: Marchia Bond, MD;  Location: [facility];  Service: Orthopedics;  Laterality: Left;  . US ECHOCARDIOGRAPHY  02-28-2010   Est EF 50-55%     reports that he quit smoking about 40 years ago. His smoking use included cigars. His smokeless tobacco use includes chew. He reports that he does not drink alcohol or use drugs.  Allergies  Allergen Reactions  . Crestor [Rosuvastatin Calcium] Other (See Comments)    Muscle pain/aches  . Mestinon [Pyridostigmine Bromide Er] Other (See Comments)    GI upset, stomach cramps    Family History  Problem Relation Age of Onset  . Breast cancer Mother   . Cancer Mother   . Diabetes Mother   . Cancer Brother        throat  . Stroke Brother   . Kidney disease Father   . Diabetes Sister   . Diabetes Daughter   . Colon cancer Neg Hx   . Heart attack Neg Hx      Prior to Admission medications   Medication Sig Start Date End Date Taking? Authorizing Provider  amoxicillin (AMOXIL) 500 MG capsule Take 1 capsule (500 mg total) by mouth 2 (two) times daily. 03/25/19   Maryruth Hancock, MD  aspirin EC 81 MG tablet Take 81 mg by mouth daily.     [provider]  bismuth  subsalicylate (PEPTO BISMOL) 262 MG/15ML suspension Take 30 mLs by mouth daily as needed for indigestion or diarrhea or loose stools.     [provider]  diclofenac sodium (VOLTAREN) 1 % GEL Apply 2 g topically 4 (four) times daily. 08/04/18   Rutherford Guys, MD  docusate sodium (COLACE) 100 MG capsule Take 1 capsule (100 mg total) by mouth every 12 (twelve) hours. 07/29/18   Margarita Mail, PA-C  doxycycline (VIBRA-TABS) 100 MG tablet Take 1 tablet (100 mg total) by mouth 2 (two) times daily. 03/25/19   Corum, Rex Kras, MD  furosemide (LASIX) 40 MG tablet Take 1 tablet (40 mg total) by mouth daily. 07/03/18   Nahser, Wonda Cheng, MD  gabapentin (NEURONTIN) 300 MG capsule TAKE 2 CAPSULES BY MOUTH THREE TIMES DAILY 02/16/19   Rutherford Guys, MD  isosorbide mononitrate (IMDUR) 30 MG 24 hr tablet Take 1 tablet (30 mg total) by mouth daily. 10/02/18  Nahser, Wonda Cheng, MD  levothyroxine (SYNTHROID) 50 MCG tablet Take 1 tablet (50 mcg total) by mouth every other day. 30 minutes before breakfast. Alternated with 49mcg tablet. 03/10/19   Rutherford Guys, MD  levothyroxine (SYNTHROID) 75 MCG tablet Take 1 tablet (75 mcg total) by mouth every other day. 30 minutes before breakfast. Alternate with 56mcg tablet. 03/10/19   Rutherford Guys, MD  lidocaine (LIDODERM) 5 % Place 1 patch onto the skin daily. Remove & Discard patch within 12 hours or as directed by MD 09/04/18   Rutherford Guys, MD  magnesium hydroxide (MILK OF MAGNESIA) 400 MG/5ML suspension Take 15 mLs by mouth daily as needed for mild constipation or moderate constipation.    [provider]  nitroGLYCERIN (NITROSTAT) 0.4 MG SL tablet Place 1 tablet (0.4 mg total) under the tongue every 5 (five) minutes as needed for chest pain. 07/03/18   Nahser, Wonda Cheng, MD  omeprazole (PRILOSEC) 20 MG capsule Take 1 capsule (20 mg total) by mouth 2 (two) times daily before a meal. 07/01/18   McVey, Gelene Mink, PA-C  potassium chloride SA (K-DUR)  20 MEQ tablet Take 1 tablet (20 mEq total) by mouth 2 (two) times daily. 03/25/19   Maryruth Hancock, MD    Physical Exam: Vitals:   03/29/19 1858 03/29/19 1900 03/29/19 2215 03/29/19 2230  BP: 105/62  (!) 112/57 116/61  Pulse: 61  (!) 52 (!) 51  Resp: 16  (!) 29 17  Temp: 97.6 F (36.4 C)     TempSrc: Oral     SpO2: 100%  97% 96%  Weight:  76.2 kg    Height:  4\' 11"  (1.499 m)      Constitutional:  . Appears calm and comfortable.  Obese. Eyes:  . No pallor. No jaundice.  ENMT:  . external ears, nose appear normal Neck:  . Neck is supple. No JVD Respiratory:  . CTA bilaterally, no w/r/r.  . Respiratory effort normal. No retractions or accessory muscle use Cardiovascular:  . S1S2 . Bilateral lower extremity edema.     Abdomen:  . Abdomen is soft and non tender. Organs are difficult to assess. Neurologic:  . Awake and alert. . Moves all limbs.   Extremities: Bilateral cellulitis of lower extremity.  Bilateral lower extremity edema.  Wt Readings from Last 3 Encounters:  03/29/19 76.2 kg  03/25/19 76.2 kg  03/25/19 76.4 kg    I have personally reviewed following labs and imaging studies  Labs on Admission:  CBC: Recent Labs  Lab 03/25/19 1230 03/29/19 1934  WBC 8.1 5.2  NEUTROABS 5.7 2.6  HGB 12.7* 12.2*  HCT 38.0 37.8*  MCV 89 91.7  PLT 200 916   Basic Metabolic Panel: Recent Labs  Lab 03/25/19 1230 03/29/19 1934  NA 137 134*  K 4.6 4.2  CL 99 103  CO2 20 19*  GLUCOSE 122* 105*  BUN 33* 34*  CREATININE 1.72* 1.67*  CALCIUM 9.4 8.9   Liver Function Tests: Recent Labs  Lab 03/25/19 1230  AST 20  ALT 12  ALKPHOS 63  BILITOT 2.0*  PROT 7.1  ALBUMIN 4.2   No results for input(s): LIPASE, AMYLASE in the last 168 hours. No results for input(s): AMMONIA in the last 168 hours. Coagulation Profile: No results for input(s): INR, PROTIME in the last 168 hours. Cardiac Enzymes: No results for input(s): CKTOTAL, CKMB, CKMBINDEX, TROPONINI in the  last 168 hours. BNP (last 3 results) No results for input(s): PROBNP in  the last 8760 hours. HbA1C: No results for input(s): HGBA1C in the last 72 hours. CBG: No results for input(s): GLUCAP in the last 168 hours. Lipid Profile: No results for input(s): CHOL, HDL, LDLCALC, TRIG, CHOLHDL, LDLDIRECT in the last 72 hours. Thyroid Function Tests: No results for input(s): TSH, T4TOTAL, FREET4, T3FREE, THYROIDAB in the last 72 hours. Anemia Panel: No results for input(s): VITAMINB12, FOLATE, FERRITIN, TIBC, IRON, RETICCTPCT in the last 72 hours. Urine analysis:    Component Value Date/Time   COLORURINE YELLOW 07/29/2018 1556   APPEARANCEUR Clear 08/04/2018 1618   LABSPEC 1.019 07/29/2018 1556   PHURINE 5.0 07/29/2018 1556   GLUCOSEU Negative 08/04/2018 1618   GLUCOSEU NEGATIVE 02/06/2013 1710   HGBUR NEGATIVE 07/29/2018 1556   HGBUR negative 12/30/2009 0858   BILIRUBINUR Negative 08/04/2018 1618   KETONESUR NEGATIVE 07/29/2018 1556   PROTEINUR Negative 08/04/2018 1618   PROTEINUR NEGATIVE 07/29/2018 1556   UROBILINOGEN 0.2 12/12/2015 1615   UROBILINOGEN 1.0 06/18/2015 1708   NITRITE Negative 08/04/2018 1618   NITRITE NEGATIVE 07/29/2018 1556   LEUKOCYTESUR Negative 08/04/2018 1618   Sepsis Labs: @LABRCNTIP (procalcitonin:4,lacticidven:4) ) Recent Results (from the past 240 hour(s))  SARS Coronavirus 2 (CEPHEID - Performed in [place] hospital lab), Hosp Order     Status: None   Collection Time: 03/29/19  7:34 PM   Specimen: Nasopharyngeal Swab  Result Value Ref Range Status   SARS Coronavirus 2 NEGATIVE NEGATIVE Final    Comment: (NOTE) If result is NEGATIVE SARS-CoV-2 target nucleic acids are NOT DETECTED. The SARS-CoV-2 RNA is generally detectable in upper and lower  respiratory specimens during the acute phase of infection. The lowest  concentration of SARS-CoV-2 viral copies this assay can detect is 250  copies / mL. A negative result does not preclude SARS-CoV-2  infection  and should not be used as the sole basis for treatment or other  patient management decisions.  A negative result may occur with  improper specimen collection / handling, submission of specimen other  than nasopharyngeal swab, presence of viral mutation(s) within the  areas targeted by this assay, and inadequate number of viral copies  (<250 copies / mL). A negative result must be combined with clinical  observations, patient history, and epidemiological information. If result is POSITIVE SARS-CoV-2 target nucleic acids are DETECTED. The SARS-CoV-2 RNA is generally detectable in upper and lower  respiratory specimens dur ing the acute phase of infection.  Positive  results are indicative of active infection with SARS-CoV-2.  Clinical  correlation with patient history and other diagnostic information is  necessary to determine patient infection status.  Positive results do  not rule out bacterial infection or co-infection with other viruses. If result is PRESUMPTIVE POSTIVE SARS-CoV-2 nucleic acids MAY BE PRESENT.   A presumptive positive result was obtained on the submitted specimen  and confirmed on repeat testing.  While 2019 novel coronavirus  (SARS-CoV-2) nucleic acids may be present in the submitted sample  additional confirmatory testing may be necessary for epidemiological  and / or clinical management purposes  to differentiate between  SARS-CoV-2 and other Sarbecovirus currently known to infect humans.  If clinically indicated additional testing with an alternate test  methodology [phone]) is advised. The SARS-CoV-2 RNA is generally  detectable in upper and lower respiratory sp ecimens during the acute  phase of infection. The expected result is Negative. Fact Sheet for Patients:  StrictlyIdeas.no Fact Sheet for Healthcare Providers: BankingDealers.co.za This test is not yet approved or cleared by the [place]  [place]  FDA and has been authorized for detection and/or diagnosis of SARS-CoV-2 by FDA under an Emergency Use Authorization (EUA).  This EUA will remain in effect (meaning this test can be used) for the duration of the COVID-19 declaration under Section 564(b)(1) of the Act, 21 U.S.C. section 360bbb-3(b)(1), unless the authorization is terminated or revoked sooner. Performed at [facility], [place] [address], [place], [place] [address]   Blood culture (routine x 2)     Status: None (Preliminary result)   Collection Time: 03/29/19  7:50 PM   Specimen: BLOOD  Result Value Ref Range Status   Specimen Description BLOOD LEFT ANTECUBITAL  Final   Special Requests   Final    BOTTLES DRAWN AEROBIC AND ANAEROBIC Blood Culture adequate volume Performed at [facility], [place] [address], [place],  [address]    Culture PENDING  Incomplete   Report Status PENDING  Incomplete      Radiological Exams on Admission: No results found.  Active Problems:   * No active hospital problems. *   Assessment/Plan Bilateral lower extremity cellulitis: Admit patient for further assessment and management. Cellulitis did not respond to oral antibiotics on outpatient basis. Patient was on oral amoxicillin and doxycycline. Start patient on IV Vanco and Further management will depend on hospital course.  Chronic kidney disease stage III: Stable. Patient has bilateral lower extremity edema. Change Lasix to IV.  Coronary artery disease: Stable. Continue to monitor.  CHF: Stable. Continue to monitor.  Hypertension: Controlled. Continue to optimize.  Diabetes mellitus: Cover with sliding scale insulin coverage.  Further management depend on hospital course.  DVT prophylaxis: Subcutaneous Lovenox Code Status: Full Family Communication:  Disposition Plan: This will depend on hospital course Consults called: None Admission status: Inpatient  Time spent: 65 minutes  Dana Allan, MD  [facility] Pager #: [phone] 7PM-7AM contact night coverage as above  03/29/2019, 11:04 PM

## 2019-03-30 DIAGNOSIS — G609 Hereditary and idiopathic neuropathy, unspecified: Secondary | ICD-10-CM

## 2019-03-30 DIAGNOSIS — I83893 Varicose veins of bilateral lower extremities with other complications: Secondary | ICD-10-CM

## 2019-03-30 DIAGNOSIS — F0150 Vascular dementia without behavioral disturbance: Secondary | ICD-10-CM

## 2019-03-30 DIAGNOSIS — I5042 Chronic combined systolic (congestive) and diastolic (congestive) heart failure: Secondary | ICD-10-CM

## 2019-03-30 DIAGNOSIS — D519 Vitamin B12 deficiency anemia, unspecified: Secondary | ICD-10-CM

## 2019-03-30 DIAGNOSIS — I480 Paroxysmal atrial fibrillation: Secondary | ICD-10-CM

## 2019-03-30 DIAGNOSIS — E039 Hypothyroidism, unspecified: Secondary | ICD-10-CM

## 2019-03-30 DIAGNOSIS — N183 Chronic kidney disease, stage 3 (moderate): Secondary | ICD-10-CM

## 2019-03-30 DIAGNOSIS — I10 Essential (primary) hypertension: Secondary | ICD-10-CM

## 2019-03-30 LAB — BASIC METABOLIC PANEL
Anion gap: 12 (ref 5–15)
BUN: 30 mg/dL — ABNORMAL HIGH (ref 8–23)
CO2: 21 mmol/L — ABNORMAL LOW (ref 22–32)
Calcium: 8.8 mg/dL — ABNORMAL LOW (ref 8.9–10.3)
Chloride: 104 mmol/L (ref 98–111)
Creatinine, Ser: 1.46 mg/dL — ABNORMAL HIGH (ref 0.61–1.24)
GFR calc Af Amer: 49 mL/min — ABNORMAL LOW (ref >60)
GFR calc non Af Amer: 42 mL/min — ABNORMAL LOW (ref >60)
Glucose, Bld: 90 mg/dL (ref 70–99)
Potassium: 3.6 mmol/L (ref 3.5–5.1)
Sodium: 137 mmol/L (ref 135–145)

## 2019-03-30 LAB — CBC
HCT: 37.7 % — ABNORMAL LOW (ref 39.0–52.0)
Hemoglobin: 12.2 g/dL — ABNORMAL LOW (ref 13.0–17.0)
MCH: 30 pg (ref 26.0–34.0)
MCHC: 32.4 g/dL (ref 30.0–36.0)
MCV: 92.6 fL (ref 80.0–100.0)
Platelets: 216 10*3/uL (ref 150–400)
RBC: 4.07 MIL/uL — ABNORMAL LOW (ref 4.22–5.81)
RDW: 13.7 % (ref 11.5–15.5)
WBC: 4.5 10*3/uL (ref 4.0–10.5)
nRBC: 0 % (ref 0.0–0.2)

## 2019-03-30 LAB — HEMOGLOBIN A1C
Hgb A1c MFr Bld: 7.3 % — ABNORMAL HIGH (ref 4.8–5.6)
Mean Plasma Glucose: 162.81 mg/dL

## 2019-03-30 LAB — TSH: TSH: 6.01 u[IU]/mL — ABNORMAL HIGH (ref 0.350–4.500)

## 2019-03-30 MED ORDER — LEVOTHYROXINE SODIUM 75 MCG PO TABS
75.0000 ug | ORAL_TABLET | ORAL | Status: DC
  Administered 2019-03-31: 05:00:00 75 ug via ORAL
  Filled 2019-03-30: qty 1

## 2019-03-30 MED ORDER — FUROSEMIDE 40 MG PO TABS
40.0000 mg | ORAL_TABLET | Freq: Every day | ORAL | Status: DC
  Administered 2019-03-31 – 2019-04-01 (×2): 40 mg via ORAL
  Filled 2019-03-30 (×2): qty 1

## 2019-03-30 MED ORDER — LEVOTHYROXINE SODIUM 75 MCG PO TABS: 75.0000 ug | ORAL_TABLET | ORAL | Status: DC

## 2019-03-30 MED ORDER — GABAPENTIN 300 MG PO CAPS
600.0000 mg | ORAL_CAPSULE | Freq: Three times a day (TID) | ORAL | Status: DC
  Administered 2019-03-30 – 2019-04-01 (×7): 600 mg via ORAL
  Filled 2019-03-30 (×7): qty 2

## 2019-03-30 MED ORDER — CYANOCOBALAMIN 1000 MCG/ML IJ SOLN
1000.0000 ug | Freq: Every day | INTRAMUSCULAR | Status: AC
  Administered 2019-03-30 – 2019-04-01 (×3): 1000 ug via SUBCUTANEOUS
  Filled 2019-03-30 (×3): qty 1

## 2019-03-30 MED ORDER — POTASSIUM CHLORIDE CRYS ER 20 MEQ PO TBCR
20.0000 meq | EXTENDED_RELEASE_TABLET | Freq: Two times a day (BID) | ORAL | Status: DC
  Administered 2019-03-30 – 2019-04-01 (×5): 20 meq via ORAL
  Filled 2019-03-30 (×5): qty 1

## 2019-03-30 MED ORDER — ASPIRIN EC 81 MG PO TBEC
81.0000 mg | DELAYED_RELEASE_TABLET | Freq: Every day | ORAL | Status: DC
  Administered 2019-03-30 – 2019-04-01 (×3): 81 mg via ORAL
  Filled 2019-03-30 (×3): qty 1

## 2019-03-30 MED ORDER — FUROSEMIDE 40 MG PO TABS: 40.0000 mg | ORAL_TABLET | Freq: Every day | ORAL | Status: DC

## 2019-03-30 MED ORDER — ISOSORBIDE MONONITRATE ER 30 MG PO TB24
30.0000 mg | ORAL_TABLET | Freq: Every day | ORAL | Status: DC
  Administered 2019-03-30 – 2019-04-01 (×3): 30 mg via ORAL
  Filled 2019-03-30 (×3): qty 1

## 2019-03-30 MED ORDER — PANTOPRAZOLE SODIUM 40 MG PO TBEC
40.0000 mg | DELAYED_RELEASE_TABLET | Freq: Every day | ORAL | Status: DC
  Administered 2019-03-30 – 2019-04-01 (×3): 40 mg via ORAL
  Filled 2019-03-30 (×3): qty 1

## 2019-03-30 MED ORDER — LEVOTHYROXINE SODIUM 50 MCG PO TABS
50.0000 ug | ORAL_TABLET | ORAL | Status: DC
  Administered 2019-03-30 – 2019-04-01 (×2): 50 ug via ORAL
  Filled 2019-03-30 (×2): qty 1

## 2019-03-30 NOTE — Progress Notes (Signed)
PROGRESS NOTE    Alam Guterrez   EUM:[id number]  DOB: 1928/08/02  DOA: 03/29/2019 PCP: Rutherford Guys, MD   Brief Narrative:  Cailen Mihalik is an 85 y/o with vascular dementia, CKD3, HTN, CVA, HLD, DVT, DM, systolic and diastolic CHF, A-fib, who presents for redness and swelling and pain of both legs and is admitted with cellulitis and started on IV antibiotics.    Subjective: Having pain in legs. Feels weak.     Assessment & Plan:   Principal Problem:   Cellulitis, leg - has erythema and tenderness worse on right leg than left - he has failed Doxycycline and Amoxil as outpt - cont IV antibiotics  Active Problems:   Varicose veins of both legs with edema and ulcers - will as for wound care eval - will likely need Unna boots once infection improves - IV Lasix given by admitting doctor to reduce pedal edema- will d/c this today and place back on oral Lasix    Hypothyroidism - cont Synthroid    Hereditary and idiopathic peripheral neuropathy - cong Gabapentin    Type 2 diabetes mellitus, controlled, with renal complications   - last V4M was 6.9 on 01/15/19 - he is not on diabetes medications as outpt - place on SSI while in the hospital - repeat A1c- if it is elevated, will need to start treatment    Vascular dementia without behavioral disturbance  - follow   Vitamin B12 deficiency - B12 level low at 218-  I have started s/c B12 injections-     Chronic kidney disease, stage III (moderate)  - cont to follow    PAF (paroxysmal atrial fibrillation)  - per cardiology notes, not on anticoagulation apparently due to a GI bleed - currently in NSR   CAD and Chronic combined systolic and diastolic CHF  - cont Lasix, Imdur - CHF appears compensated  Time spent in minutes: 35 DVT prophylaxis: Lovenox Code Status: Full code Family Communication: spoke with daughter and [place] Disposition Plan: home when stable-  Consultants:   none Procedures:    none Antimicrobials:  Anti-infectives (From admission, onward)   Start     Dose/Rate Route Frequency Ordered Stop   03/31/19 2200  vancomycin (VANCOCIN) IVPB 750 mg/150 ml premix     750 mg 150 mL/hr over 60 Minutes Intravenous Every 48 hours 03/29/19 2052     03/30/19 0300  piperacillin-tazobactam (ZOSYN) IVPB 3.375 g     3.375 g 12.5 mL/hr over 240 Minutes Intravenous Every 8 hours 03/29/19 2052     03/29/19 1930  piperacillin-tazobactam (ZOSYN) IVPB 3.375 g     3.375 g 100 mL/hr over 30 Minutes Intravenous  Once 03/29/19 1919 03/29/19 2202   03/29/19 1930  vancomycin (VANCOCIN) 1,500 mg in sodium chloride 0.9 % 500 mL IVPB     1,500 mg 250 mL/hr over 120 Minutes Intravenous  Once 03/29/19 1919 03/30/19 0125       Objective: Vitals:   03/29/19 2230 03/29/19 2315 03/29/19 2353 03/30/19 0441  BP: 116/61 (!) 119/56 116/73 119/65  Pulse: (!) 51 (!) 56 (!) 121 (!) 51  Resp: 17 13 17 17   Temp:    98.9 F (37.2 C)  TempSrc:      SpO2: 96% 98% 91% 100%  Weight:      Height:        Intake/Output Summary (Last 24 hours) at 03/30/2019 1038 Last data filed at 03/30/2019 0924 Gross per 24 hour  Intake 735.83 ml  Output 1500 ml  Net -764.17 ml   Filed Weights   03/29/19 1900  Weight: 76.2 kg    Examination: General exam: Appears comfortable  HEENT: PERRLA, oral mucosa moist, no sclera icterus or thrush Respiratory system: Clear to auscultation. Respiratory effort normal. Cardiovascular system: S1 & S2 heard, RRR.   Gastrointestinal system: Abdomen soft, non-tender, nondistended. Normal bowel sounds. Central nervous system: Alert and oriented. No focal neurological deficits. Extremities: No cyanosis, clubbing or edema Psychiatry:  Mood & affect appropriate.  Skin: see below        Data Reviewed: I have personally reviewed following labs and imaging studies  CBC: Recent Labs  Lab 03/25/19 1230 03/29/19 1934 03/30/19 0343  WBC 8.1 5.2 4.5  NEUTROABS 5.7 2.6  --    HGB 12.7* 12.2* 12.2*  HCT 38.0 37.8* 37.7*  MCV 89 91.7 92.6  PLT 200 228 500   Basic Metabolic Panel: Recent Labs  Lab 03/25/19 1230 03/29/19 1934 03/30/19 0343  NA 137 134* 137  K 4.6 4.2 3.6  CL 99 103 104  CO2 20 19* 21*  GLUCOSE 122* 105* 90  BUN 33* 34* 30*  CREATININE 1.72* 1.67* 1.46*  CALCIUM 9.4 8.9 8.8*   GFR: Estimated Creatinine Clearance: 29.2 mL/min (A) (by C-G formula based on SCr of 1.46 mg/dL (H)). Liver Function Tests: Recent Labs  Lab 03/25/19 1230  AST 20  ALT 12  ALKPHOS 63  BILITOT 2.0*  PROT 7.1  ALBUMIN 4.2   No results for input(s): LIPASE, AMYLASE in the last 168 hours. No results for input(s): AMMONIA in the last 168 hours. Coagulation Profile: No results for input(s): INR, PROTIME in the last 168 hours. Cardiac Enzymes: No results for input(s): CKTOTAL, CKMB, CKMBINDEX, TROPONINI in the last 168 hours. BNP (last 3 results) No results for input(s): PROBNP in the last 8760 hours. HbA1C: No results for input(s): HGBA1C in the last 72 hours. CBG: No results for input(s): GLUCAP in the last 168 hours. Lipid Profile: No results for input(s): CHOL, HDL, LDLCALC, TRIG, CHOLHDL, LDLDIRECT in the last 72 hours. Thyroid Function Tests: Recent Labs    03/30/19 0343  TSH 6.010*   Anemia Panel: No results for input(s): VITAMINB12, FOLATE, FERRITIN, TIBC, IRON, RETICCTPCT in the last 72 hours. Urine analysis:    Component Value Date/Time   COLORURINE YELLOW 07/29/2018 1556   APPEARANCEUR Clear 08/04/2018 1618   LABSPEC 1.019 07/29/2018 1556   PHURINE 5.0 07/29/2018 1556   GLUCOSEU Negative 08/04/2018 1618   GLUCOSEU NEGATIVE 02/06/2013 1710   HGBUR NEGATIVE 07/29/2018 1556   HGBUR negative 12/30/2009 0858   BILIRUBINUR Negative 08/04/2018 1618   KETONESUR NEGATIVE 07/29/2018 1556   PROTEINUR Negative 08/04/2018 1618   PROTEINUR NEGATIVE 07/29/2018 1556   UROBILINOGEN 0.2 12/12/2015 1615   UROBILINOGEN 1.0 06/18/2015 1708    NITRITE Negative 08/04/2018 1618   NITRITE NEGATIVE 07/29/2018 1556   LEUKOCYTESUR Negative 08/04/2018 1618   Sepsis Labs: @LABRCNTIP (procalcitonin:4,lacticidven:4) ) Recent Results (from the past 240 hour(s))  Blood culture (routine x 2)     Status: None (Preliminary result)   Collection Time: 03/29/19  7:34 PM   Specimen: Right Antecubital; Blood  Result Value Ref Range Status   Specimen Description RIGHT ANTECUBITAL  Final   Special Requests   Final    BOTTLES DRAWN AEROBIC AND ANAEROBIC Blood Culture adequate volume   Culture   Final    NO GROWTH < 12 HOURS Performed at [facility], [place],  [place] [address]    Report Status PENDING  Incomplete  SARS Coronavirus 2 (CEPHEID - Performed in [place] hospital lab), Hosp Order     Status: None   Collection Time: 03/29/19  7:34 PM   Specimen: Nasopharyngeal Swab  Result Value Ref Range Status   SARS Coronavirus 2 NEGATIVE NEGATIVE Final    Comment: (NOTE) If result is NEGATIVE SARS-CoV-2 target nucleic acids are NOT DETECTED. The SARS-CoV-2 RNA is generally detectable in upper and lower  respiratory specimens during the acute phase of infection. The lowest  concentration of SARS-CoV-2 viral copies this assay can detect is 250  copies / mL. A negative result does not preclude SARS-CoV-2 infection  and should not be used as the sole basis for treatment or other  patient management decisions.  A negative result may occur with  improper specimen collection / handling, submission of specimen other  than nasopharyngeal swab, presence of viral mutation(s) within the  areas targeted by this assay, and inadequate number of viral copies  (<250 copies / mL). A negative result must be combined with clinical  observations, patient history, and epidemiological information. If result is POSITIVE SARS-CoV-2 target nucleic acids are DETECTED. The SARS-CoV-2 RNA is generally detectable in upper and lower  respiratory  specimens dur ing the acute phase of infection.  Positive  results are indicative of active infection with SARS-CoV-2.  Clinical  correlation with patient history and other diagnostic information is  necessary to determine patient infection status.  Positive results do  not rule out bacterial infection or co-infection with other viruses. If result is PRESUMPTIVE POSTIVE SARS-CoV-2 nucleic acids MAY BE PRESENT.   A presumptive positive result was obtained on the submitted specimen  and confirmed on repeat testing.  While 2019 novel coronavirus  (SARS-CoV-2) nucleic acids may be present in the submitted sample  additional confirmatory testing may be necessary for epidemiological  and / or clinical management purposes  to differentiate between  SARS-CoV-2 and other Sarbecovirus currently known to infect humans.  If clinically indicated additional testing with an alternate test  methodology [phone]) is advised. The SARS-CoV-2 RNA is generally  detectable in upper and lower respiratory sp ecimens during the acute  phase of infection. The expected result is Negative. Fact Sheet for Patients:  StrictlyIdeas.no Fact Sheet for Healthcare Providers: BankingDealers.co.za This test is not yet approved or cleared by the [place] FDA and has been authorized for detection and/or diagnosis of SARS-CoV-2 by FDA under an Emergency Use Authorization (EUA).  This EUA will remain in effect (meaning this test can be used) for the duration of the COVID-19 declaration under Section 564(b)(1) of the Act, 21 U.S.C. section 360bbb-3(b)(1), unless the authorization is terminated or revoked sooner. Performed at [facility], [place] [address] [place], [place] [address]   Blood culture (routine x 2)     Status: None (Preliminary result)   Collection Time: 03/29/19  7:50 PM   Specimen: BLOOD  Result Value Ref Range Status   Specimen Description BLOOD LEFT  ANTECUBITAL  Final   Special Requests   Final    BOTTLES DRAWN AEROBIC AND ANAEROBIC Blood Culture adequate volume   Culture   Final    NO GROWTH < 12 HOURS Performed at [facility], [place] [address] [place], [place] [address]    Report Status PENDING  Incomplete         Radiology Studies: No results found.    Scheduled Meds: . aspirin EC  81 mg Oral Daily  . cyanocobalamin  1,000 mcg Subcutaneous Daily  . enoxaparin (LOVENOX) injection  30 mg Subcutaneous Q24H  . furosemide  60 mg Intravenous BID  . gabapentin  600 mg Oral TID  . isosorbide mononitrate  30 mg Oral Daily  . levothyroxine  50 mcg Oral QODAY  . [START ON 03/31/2019] levothyroxine  75 mcg Oral QODAY  . pantoprazole  40 mg Oral Daily  . potassium chloride SA  20 mEq Oral BID   Continuous Infusions: . piperacillin-tazobactam (ZOSYN)  IV 3.375 g (03/30/19 0259)  . [START ON 03/31/2019] vancomycin       LOS: 1 day      Debbe Odea, MD [facility] Pager: [email or web address] Password [facility] 03/30/2019, 10:38 AM

## 2019-03-30 NOTE — Evaluation (Signed)
Physical Therapy Evaluation Patient Details Name: [name] Elliott MRN: [id number] DOB: December 21, 1927 Today's Date: 03/30/2019   History of Present Illness  Pt is an 85 y/o male admitted secondary to BLE cellulitis. PMH includes a fib, CHF, DM, CKD, HTN, and dementia.   Clinical Impression  Pt admitted secondary to problem above with deficits below. Pt very limited by pain in BLE this session and only able to tolerate short distance ambulation. Pt also unsteady and requiring min A with use of his cane. Educated about using RW, however, pt refusing. Feel pt will require 24/7 assist at d/c and per pt report he currently lives alone. Feel he would benefit from short term SNF, however, pt may refuse. Will continue to follow acutely to maximize functional mobility independence and safety.     Follow Up Recommendations SNF;Supervision/Assistance - 24 hour    Equipment Recommendations  Rolling walker with 5" wheels    Recommendations for Other Services       Precautions / Restrictions Precautions Precautions: Fall Restrictions Weight Bearing Restrictions: No      Mobility  Bed Mobility Overal bed mobility: Needs Assistance Bed Mobility: Supine to Sit     Supine to sit: Min guard     General bed mobility comments: Min guard for safety. Increased time to come to sitting secondary to LE pain.   Transfers Overall transfer level: Needs assistance Equipment used: Straight cane Transfers: Sit to/from Stand Sit to Stand: Min assist         General transfer comment: Min A for lift assist and steadying. Pt reporting increased pain with weightbearing.   Ambulation/Gait Ambulation/Gait assistance: Min assist Gait Distance (Feet): 2 Feet Assistive device: Straight cane Gait Pattern/deviations: Step-to pattern;Decreased step length - right;Decreased step length - left;Decreased weight shift to right;Antalgic Gait velocity: Decreased.    General Gait Details: Slow, antalgic gait secondary  to pain in BLE. (RLE<LLE) Decreased weightshift noted to RLE. Pt requiring min A for short distance ambulation to chair. Educated about using RW, however, pt currently refusing.   Stairs            Wheelchair Mobility    Modified Rankin (Stroke Patients Only)       Balance Overall balance assessment: Needs assistance Sitting-balance support: No upper extremity supported;Feet supported Sitting balance-Leahy Scale: Good     Standing balance support: Single extremity supported;During functional activity Standing balance-Leahy Scale: Poor Standing balance comment: Reliant on at least 1 UE and external support.                              Pertinent Vitals/Pain Pain Assessment: Faces Faces Pain Scale: Hurts whole lot Pain Location: BLE  Pain Descriptors / Indicators: Grimacing;Guarding Pain Intervention(s): Limited activity within patient's tolerance;Monitored during session;Repositioned    Home Living Family/patient expects to be discharged to:: Private residence Living Arrangements: Alone Available Help at Discharge: Family;Available PRN/intermittently Type of Home: House Home Access: Stairs to enter Entrance Stairs-Rails: None Entrance Stairs-Number of Steps: 1 Home Layout: One level Home Equipment: Cane - single point;Walker - 2 wheels      Prior Function Level of Independence: Independent with assistive device(s)         Comments: Uses cane for ambulation      Hand Dominance        Extremity/Trunk Assessment   Upper Extremity Assessment Upper Extremity Assessment: Defer to OT evaluation    Lower Extremity Assessment Lower Extremity Assessment: Generalized weakness;RLE deficits/detail;LLE  deficits/detail RLE Deficits / Details: Redness and wounds noted on shin.  LLE Deficits / Details: redness noted on shin    Cervical / Trunk Assessment Cervical / Trunk Assessment: Normal  Communication   Communication: HOH  Cognition  Arousal/Alertness: Awake/alert Behavior During Therapy: WFL for tasks assessed/performed Overall Cognitive Status: History of cognitive impairments - at baseline                                 General Comments: Dementia at baseline       General Comments      Exercises     Assessment/Plan    PT Assessment Patient needs continued PT services  PT Problem List Decreased strength;Decreased activity tolerance;Decreased mobility;Decreased balance;Decreased cognition;Decreased safety awareness;Decreased knowledge of precautions;Pain       PT Treatment Interventions DME instruction;Gait training;Stair training;Functional mobility training;Therapeutic activities;Therapeutic exercise;Balance training;Patient/family education;Cognitive remediation    PT Goals (Current goals can be found in the Care Plan section)  Acute Rehab PT Goals Patient Stated Goal: to decrease pain PT Goal Formulation: With patient Time For Goal Achievement: 04/13/19 Potential to Achieve Goals: Good    Frequency Min 3X/week   Barriers to discharge Decreased caregiver support      Co-evaluation               AM-PAC PT "6 Clicks" Mobility  Outcome Measure Help needed turning from your back to your side while in a flat bed without using bedrails?: A Little Help needed moving from lying on your back to sitting on the side of a flat bed without using bedrails?: A Little Help needed moving to and from a bed to a chair (including a wheelchair)?: A Little Help needed standing up from a chair using your arms (e.g., wheelchair or bedside chair)?: A Little Help needed to walk in hospital room?: A Little Help needed climbing 3-5 steps with a railing? : A Lot 6 Click Score: 17    End of Session Equipment Utilized During Treatment: Gait belt Activity Tolerance: Patient limited by pain Patient left: in chair;with call bell/phone within reach;with chair alarm set Nurse Communication: Mobility  status PT Visit Diagnosis: Other abnormalities of gait and mobility (R26.89);Unsteadiness on feet (R26.81);Muscle weakness (generalized) (M62.81);Pain Pain - Right/Left: (bilateral) Pain - part of body: Leg    Time: 4650-3546 PT Time Calculation (min) (ACUTE ONLY): 20 min   Charges:   PT Evaluation $PT Eval Moderate Complexity: 1 Mod          Leighton Ruff, PT, DPT  Acute Rehabilitation Services  Pager: [phone] Office: [phone]   Rudean Hitt 03/30/2019, 6:14 PM

## 2019-03-30 NOTE — Plan of Care (Signed)
?  Problem: Clinical Measurements: ?Goal: Ability to avoid or minimize complications of infection will improve ?Outcome: Progressing ?  ?Problem: Skin Integrity: ?Goal: Skin integrity will improve ?Outcome: Progressing ?  ?

## 2019-03-31 ENCOUNTER — Ambulatory Visit: Payer: PPO | Admitting: Family Medicine

## 2019-03-31 DIAGNOSIS — E1129 Type 2 diabetes mellitus with other diabetic kidney complication: Secondary | ICD-10-CM

## 2019-03-31 LAB — GLUCOSE, CAPILLARY
Glucose-Capillary: 98 mg/dL (ref 70–99)
Glucose-Capillary: 121 mg/dL — ABNORMAL HIGH (ref 70–99)
Glucose-Capillary: 128 mg/dL — ABNORMAL HIGH (ref 70–99)
Glucose-Capillary: 101 mg/dL — ABNORMAL HIGH (ref 70–99)

## 2019-03-31 MED ORDER — INSULIN ASPART 100 UNIT/ML ~~LOC~~ SOLN: 0.0000 [IU] | Freq: Three times a day (TID) | SUBCUTANEOUS | Status: DC

## 2019-03-31 MED ORDER — HYDROCODONE-ACETAMINOPHEN 5-325 MG PO TABS
1.0000 | ORAL_TABLET | ORAL | Status: DC | PRN
  Administered 2019-03-31 – 2019-04-01 (×2): 1 via ORAL
  Filled 2019-03-31 (×2): qty 1

## 2019-03-31 MED ORDER — ADULT MULTIVITAMIN W/MINERALS CH
1.0000 | ORAL_TABLET | Freq: Every day | ORAL | Status: DC
  Administered 2019-03-31 – 2019-04-01 (×2): 1 via ORAL
  Filled 2019-03-31 (×2): qty 1

## 2019-03-31 MED ORDER — JUVEN PO PACK
1.0000 | PACK | Freq: Two times a day (BID) | ORAL | Status: DC
  Administered 2019-03-31 – 2019-04-01 (×3): 1 via ORAL
  Filled 2019-03-31 (×3): qty 1

## 2019-03-31 NOTE — TOC Progression Note (Signed)
Transition of Care [facility]) - Progression Note    Patient Details  Name: [name] Elliott MRN: [id number] Date of Birth: Mar 27, 1928  Transition of Care [facility]) CM/SW [place], [place] Phone Number: 03/31/2019, 2:34 PM  Clinical Narrative:    CSW left an additional HIPAA compliant message for pt daughter at [phone].    Expected Discharge Plan: [place] Barriers to Discharge: Ship broker, Continued Medical Work up  Expected Discharge Plan and Services Expected Discharge Plan: [place] In-house Referral: Clinical Social Work Discharge Planning Services: CM Consult Post Acute Care Choice: [place] arrangements for the past 2 months: Single Family Home                   Social Determinants of Health (SDOH) Interventions    Readmission Risk Interventions No flowsheet data found.

## 2019-03-31 NOTE — Progress Notes (Signed)
Spoke to daughter, father is currently in hospital for cellulitis in the leg. Seems to be doing better, may come home tomorrow. She says father has b12 shot last week. She will make appt for [place] for the next

## 2019-03-31 NOTE — Progress Notes (Signed)
Occupational Therapy Evaluation Patient Details Name: [name] Elliott MRN: [id number] DOB: 14-Aug-1928 Today's Date: 03/31/2019    History of Present Illness Pt is an 85 y/o male admitted secondary to BLE cellulitis. PMH includes a fib, CHF, DM, CKD, HTN, and dementia.    Clinical Impression   Pt presents with above diagnosis. PTA pt PLOF living at home alone and requiring assistance with ADLs. Pt reports daughter coming to assist with medical management. Pt currently requires assists with functional transfers, LB ADLs, and safety awareness due to decreased strength, pain, and limited function. Pt educated on importance of RW instead of cane during functional transfer to ensure safety. Pt will benefit from continued acute OT and SNF level to ensure safe transition to home environment.     Follow Up Recommendations  SNF;Supervision/Assistance - 24 hour    Equipment Recommendations  3 in 1 bedside commode    Recommendations for Other Services       Precautions / Restrictions Precautions Precautions: Fall Restrictions Weight Bearing Restrictions: No      Mobility Bed Mobility Overal bed mobility: Needs Assistance             General bed mobility comments: pt received seated at EOB upon arrival  Transfers Overall transfer level: Needs assistance Equipment used: Straight cane Transfers: Sit to/from Stand Sit to Stand: Min assist         General transfer comment: Min A for lift assist and steadying. Pt does not report increased pain.     Balance Overall balance assessment: Needs assistance Sitting-balance support: No upper extremity supported;Feet supported Sitting balance-Leahy Scale: Good     Standing balance support: Single extremity supported;During functional activity Standing balance-Leahy Scale: Poor Standing balance comment: Reliant on at least 1 UE and external support.                            ADL either performed or assessed with clinical  judgement   ADL Overall ADL's : Needs assistance/impaired Eating/Feeding: Set up;Sitting   Grooming: Set up;Sitting   Upper Body Bathing: Modified independent;Sitting       Upper Body Dressing : Modified independent;Sitting   Lower Body Dressing: Minimal assistance Lower Body Dressing Details (indicate cue type and reason): Pt able to doff sock in sitting with increased time. Required assist to don sock, able to follow through to complete bringing the sock up.  Toilet Transfer: Minimal assistance;Ambulation;RW Toilet Transfer Details (indicate cue type and reason): Min A for safety with simulated toilet transfer from bed to chair.          Functional mobility during ADLs: Minimal assistance       Vision         Perception     Praxis      Pertinent Vitals/Pain Pain Assessment: Faces Faces Pain Scale: Hurts little more Pain Location: BLE  Pain Descriptors / Indicators: Grimacing;Guarding;Sore Pain Intervention(s): Monitored during session;Repositioned     Hand Dominance     Extremity/Trunk Assessment Upper Extremity Assessment Upper Extremity Assessment: Generalized weakness   Lower Extremity Assessment Lower Extremity Assessment: Defer to PT evaluation   Cervical / Trunk Assessment Cervical / Trunk Assessment: Normal   Communication Communication Communication: HOH   Cognition Arousal/Alertness: Awake/alert Behavior During Therapy: WFL for tasks assessed/performed Overall Cognitive Status: History of cognitive impairments - at baseline  General Comments: Dementia at baseline    General Comments  Pt educated on importance of using RW instead of cane while at home. Able to demonstrate functional mobilty with RW.    Exercises     Shoulder Instructions      Home Living Family/patient expects to be discharged to:: Private residence Living Arrangements: Alone Available Help at Discharge: Family;Available  PRN/intermittently Type of Home: House Home Access: Stairs to enter [employer] of Steps: 1 Entrance Stairs-Rails: None Home Layout: One level     Bathroom Shower/Tub: Tub/shower unit         Home Equipment: Cane - single point;Walker - 2 wheels   Additional Comments: reports using mostly his cane while at home. States daughter comes to assist with medical management.       Prior Functioning/Environment Level of Independence: Independent with assistive device(s)        Comments: Uses cane for ambulation         OT Problem List:        OT Treatment/Interventions: Self-care/ADL training;Therapeutic exercise;DME and/or AE instruction;Patient/family education;Balance training    OT Goals(Current goals can be found in the care plan section) Acute Rehab OT Goals Patient Stated Goal: to decrease pain OT Goal Formulation: Patient unable to participate in goal setting Time For Goal Achievement: 04/14/19 Potential to Achieve Goals: Fair  OT Frequency: Min 2X/week   Barriers to D/C: Decreased caregiver support  Pt reports living at home alone with some assist by daughter.       Co-evaluation              AM-PAC OT "6 Clicks" Daily Activity     Outcome Measure Help from another person eating meals?: None Help from another person taking care of personal grooming?: A Little Help from another person toileting, which includes using toliet, bedpan, or urinal?: A Little Help from another person bathing (including washing, rinsing, drying)?: A Little Help from another person to put on and taking off regular upper body clothing?: None Help from another person to put on and taking off regular lower body clothing?: A Little 6 Click Score: 20   End of Session Equipment Utilized During Treatment: Gait belt;Rolling walker Nurse Communication: Mobility status  Activity Tolerance: Patient tolerated treatment well Patient left: in chair;with call bell/phone within  reach;with chair alarm set  OT Visit Diagnosis: Unsteadiness on feet (R26.81);Pain;Muscle weakness (generalized) (M62.81)                Time: 9842-1031 OT Time Calculation (min): 22 min Charges:  OT General Charges $OT Visit: 1 Visit OT Evaluation $OT Eval Low Complexity: [name] Elliott, MSOT, OTR/L  Supplemental Rehabilitation Services  [phone]   [name] Elliott 03/31/2019, 3:49 PM

## 2019-03-31 NOTE — Progress Notes (Signed)
Physical Therapy Treatment Patient Details Name: [name] Elliott MRN: [id number] DOB: 07/22/1928 Today's Date: 03/31/2019    History of Present Illness Pt is an 85 y/o male admitted secondary to BLE cellulitis. PMH includes a fib, CHF, DM, CKD, HTN, and dementia.    PT Comments    Pt progressing with mobility. Able to ambulate into hallway with RW and close min guard for balance; c/o bilateral LE pain (RLE > LLE). Pt requries max encouragement to use RW as opposed to his SPC; max encouragement on use of RW at home due to high fall risk and instability. Pt adamantly declining SNF; recommend HHPT services if agreeable.    Follow Up Recommendations  Home health PT;Supervision/Assistance - 24 hour(declined SNF recommendation)     Equipment Recommendations  Rolling walker with 5" wheels    Recommendations for Other Services       Precautions / Restrictions Precautions Precautions: Fall Restrictions Weight Bearing Restrictions: No    Mobility  Bed Mobility Overal bed mobility: Needs Assistance             General bed mobility comments: Received sitting in recliner  Transfers Overall transfer level: Needs assistance Equipment used: Rolling walker (2 wheeled) Transfers: Sit to/from Stand Sit to Stand: Min guard         General transfer comment: Reliant on momentum to power into standing; min guard for safety, no physical assist required  Ambulation/Gait Ambulation/Gait assistance: Min guard Gait Distance (Feet): 80 Feet Assistive device: Rolling walker (2 wheeled) Gait Pattern/deviations: Step-to pattern;Shuffle;Trunk flexed Gait velocity: Decreased.  Gait velocity interpretation: <1.31 ft/sec, indicative of household ambulator General Gait Details: Required max encouragement to use RW insteady of SPC. Slow, unsteady gait with RW and close min guard for balance; frequent cues to maintain closer proximity to RW. Pt c/o BLE pain   Stairs             Wheelchair  Mobility    Modified Rankin (Stroke Patients Only)       Balance Overall balance assessment: Needs assistance Sitting-balance support: No upper extremity supported;Feet supported Sitting balance-Leahy Scale: Good     Standing balance support: Single extremity supported;During functional activity Standing balance-Leahy Scale: Poor Standing balance comment: Reliant on at least 1 UE and external support.                             Cognition Arousal/Alertness: Awake/alert Behavior During Therapy: WFL for tasks assessed/performed Overall Cognitive Status: History of cognitive impairments - at baseline                                 General Comments: Dementia at baseline; follows simple commands and answer questions appropriately, difficult to reason with, poor problem solving      Exercises      General Comments General comments (skin integrity, edema, etc.): Further reinforced education on recommendation to use RW instead of SPC while admitted and upon return home      Pertinent Vitals/Pain Pain Assessment: Faces Faces Pain Scale: Hurts a little bit Pain Location: BLE (RLE > LLE) Pain Descriptors / Indicators: Guarding;Sore Pain Intervention(s): Monitored during session;Repositioned    Home Living Family/patient expects to be discharged to:: Private residence Living Arrangements: Alone Available Help at Discharge: Family;Available PRN/intermittently Type of Home: House Home Access: Stairs to enter Entrance Stairs-Rails: None Home Layout: One level Home Equipment: Cane - single  point;Walker - 2 wheels Additional Comments: reports using mostly his cane while at home. States daughter comes to assist with medical management.     Prior Function Level of Independence: Independent with assistive device(s)      Comments: Uses cane for ambulation    PT Goals (current goals can now be found in the care plan section) Acute Rehab PT Goals Patient  Stated Goal: to go home PT Goal Formulation: With patient Time For Goal Achievement: 04/13/19 Potential to Achieve Goals: Good Progress towards PT goals: Progressing toward goals    Frequency    Min 3X/week      PT Plan Discharge plan needs to be updated    Co-evaluation              AM-PAC PT "6 Clicks" Mobility   Outcome Measure  Help needed turning from your back to your side while in a flat bed without using bedrails?: A Little Help needed moving from lying on your back to sitting on the side of a flat bed without using bedrails?: A Little Help needed moving to and from a bed to a chair (including a wheelchair)?: A Little Help needed standing up from a chair using your arms (e.g., wheelchair or bedside chair)?: A Little Help needed to walk in hospital room?: A Little Help needed climbing 3-5 steps with a railing? : A Lot 6 Click Score: 17    End of Session   Activity Tolerance: Patient tolerated treatment well Patient left: in chair;with call bell/phone within reach;with chair alarm set Nurse Communication: Mobility status PT Visit Diagnosis: Other abnormalities of gait and mobility (R26.89);Unsteadiness on feet (R26.81);Muscle weakness (generalized) (M62.81);Pain Pain - part of body: Leg     Time: 1660-6301 PT Time Calculation (min) (ACUTE ONLY): 17 min  Charges:  $Gait Training: 8-22 mins                    Mabeline Caras, PT, DPT Acute Rehabilitation Services  Pager [phone] Office [place] 03/31/2019, 5:30 PM

## 2019-03-31 NOTE — Progress Notes (Signed)
Initial Nutrition Assessment  RD working remotely.  DOCUMENTATION CODES:   Obesity unspecified  INTERVENTION:   -1 packet Juven BID, each packet provides 95 calories, 2.5 grams of protein (collagen), and 9.8 grams of carbohydrate (3 grams sugar); also contains 7 grams of L-arginine and L-glutamine, 300 mg vitamin C, 15 mg vitamin E, 1.2 mcg vitamin B-12, 9.5 mg zinc, 200 mg calcium, and 1.5 g  Calcium Beta-hydroxy-Beta-methylbutyrate to support wound healing -MVI with minerals daily -Provided "Heart Healthy, Consistent Carbohydrate Nutrition Therapy" handout from [facility] Nutrition Care Manual in AVS/discharge summary  NUTRITION DIAGNOSIS:   Increased nutrient needs related to wound healing as evidenced by estimated needs.  GOAL:   Patient will meet greater than or equal to 90% of their needs  MONITOR:   PO intake, Supplement acceptance, Labs, Weight trends, Skin, I & O's  REASON FOR ASSESSMENT:   Consult Diet education  ASSESSMENT:   Patient is an 85 year old Caucasian male with multiple medical and cardiac history.  Patient carries diagnosis of vascular dementia, chronic kidney disease stage III, hypertension, thrombocytopenia, CVA, hyperlipidemia, PVCs, DVT, diabetes mellitus, diastolic congestive heart failure, atrial fibrillation and coronary artery disease amongst other medical and cardiac problems.  Patient has been on amoxicillin and doxycycline for cellulitis of bilateral lower extremities without any significant improvement.  Patient presents with bilateral lower extremity cellulitis that has not responded to oral antibiotics on outpatient basis.  Patient's ability to give history is limited due to the dementing process.  Specifically, patient denies fever, chills, headache, neck pain, URI symptoms, chest pain, shortness of breath, GI symptoms or urinary symptoms.  Patient be admitted for further assessment and management of bilateral cellulitis of lower extremities that did  not respond to oral antibiotics on outpatient basis.  Pt admitted with bilateral leg cellulitis.   Reviewed I/O's: -2.2 L x 24 hours and -2.7 L since admission  UOP: 2.5 L x 24 hours  Reviewed [facility] notes from 03/31/19; pt with bilateral BLE venous stasis ulcers.   RD attempted to speak with pt via phone, however, no answer.   Reviewed wt hx; wt has been stable over the past year.   Pt with fair to good appetite; noted meal completion 60-95%. Given increased nutrient needs for healing, pt would benefit from addition of nutritional supplements.  Per therapy notes, recommending SNF, however, pt is refusing and will discharge home with family members once medically stable.   Lab Results  Component Value Date   HGBA1C 7.3 (H) 03/30/2019   PTA DM medications are none. Per ADA's Standards of Medical Care for Diabetes, glycemic targets for older adults who have multiple co-morbidities, cognitive impairments, and functional dependence should be less stringent (Hgb A1c <8.0-8.5). Also suspect that recent infection impacting CBGS and Hgb A1c.   Labs reviewed: CBGS: 98-121 (inpatient orders for glycemic control are 0-15 units insulin aspart TID with meals)  NUTRITION - FOCUSED PHYSICAL EXAM:    Most Recent Value  Orbital Region  Unable to assess  Upper Arm Region  Unable to assess  Thoracic and Lumbar Region  Unable to assess  Buccal Region  Unable to assess  Temple Region  Unable to assess  Clavicle Bone Region  Unable to assess  Clavicle and Acromion Bone Region  Unable to assess  Scapular Bone Region  Unable to assess  Dorsal Hand  Unable to assess  Patellar Region  Unable to assess  Anterior Thigh Region  Unable to assess  Posterior Calf Region  Unable to assess  Edema (RD Assessment)  Unable to assess  Hair  Unable to assess  Eyes  Unable to assess  Mouth  Unable to assess  Skin  Unable to assess  Nails  Unable to assess       Diet Order:   Diet Order            Diet  heart healthy/carb modified Room service appropriate? Yes; Fluid consistency: Thin  Diet effective now              EDUCATION NEEDS:   No education needs have been identified at this time  Skin:  Skin Assessment: Skin Integrity Issues: Skin Integrity Issues:: Other (Comment) Other: venous stasis ulcers on BLE  Last BM:  03/30/19  Height:   Ht Readings from Last 1 Encounters:  03/29/19 4\' 11"  (1.499 m)    Weight:   Wt Readings from Last 1 Encounters:  03/29/19 76.2 kg    Ideal Body Weight:  47.4 kg  BMI:  Body mass index is 33.93 kg/m.  Estimated Nutritional Needs:   Kcal:  1550-1750  Protein:  75-90 grams  Fluid:  > 1.5 L    [name] Elliott, RD, LDN, [place] Registered Dietitian II Certified Diabetes Care and Education Specialist Pager: [phone] After hours Pager: [phone]

## 2019-03-31 NOTE — Plan of Care (Signed)
  Problem: Clinical Measurements: Goal: Ability to avoid or minimize complications of infection will improve Outcome: Progressing   Problem: Skin Integrity: Goal: Skin integrity will improve Outcome: Progressing   Problem: Education: Goal: Knowledge of General Education information will improve Description: Including pain rating scale, medication(s)/side effects and non-pharmacologic comfort measures Outcome: Progressing   Problem: Health Behavior/Discharge Planning: Goal: Ability to manage health-related needs will improve Outcome: Progressing   Problem: Clinical Measurements: Goal: Ability to maintain clinical measurements within normal limits will improve Outcome: Progressing Goal: Will remain free from infection Outcome: Progressing Goal: Diagnostic test results will improve Outcome: Progressing   Problem: Activity: Goal: Risk for activity intolerance will decrease Outcome: Progressing   Problem: Nutrition: Goal: Adequate nutrition will be maintained Outcome: Progressing   Problem: Coping: Goal: Level of anxiety will decrease Outcome: Progressing   Problem: Elimination: Goal: Will not experience complications related to bowel motility Outcome: Progressing Goal: Will not experience complications related to urinary retention Outcome: Progressing   Problem: Pain Managment: Goal: General experience of comfort will improve Outcome: Progressing   Problem: Safety: Goal: Ability to remain free from injury will improve Outcome: Progressing   Problem: Skin Integrity: Goal: Risk for impaired skin integrity will decrease Outcome: Progressing

## 2019-03-31 NOTE — Progress Notes (Addendum)
PROGRESS NOTE    [name] Elliott   VZD:[id number]  DOB: Jan 21, 1928  DOA: 03/29/2019 PCP: Rutherford Guys, MD   Brief Narrative:  [name] Elliott is an 85 y/o with vascular dementia, CKD3, HTN, CVA, HLD, DVT, DM, chronic venous stasis systolic and diastolic CHF, A-fib, who presents for redness and swelling and pain of both legs. He was started on Amoxil and Doxy as outpt and took it for a few days but symptoms continued to worsen. He was having trouble ambulating. He was admitted with cellulitis and started on IV antibiotics.    Subjective: Feels leg pain is improving a little.     Assessment & Plan:   Principal Problem:   Cellulitis of Lower extermities - infection likely related to venous ulcers on legs - continues to have swelling, erythema and tenderness worse on right leg than left but it is slightly improved from yesterday  - he has failed Doxycycline and Amoxil which was ordered by his PCP as outpt - currently on Vanc and Zosyn - cont IV antibiotics for another 24 hrs- PT has recommended SNF- he does not appear to be open to it- his daughter also states today that he will never agree today- he does have a walker at home but chooses not to use it  Active Problems:   Varicose veins of both legs with edema and ulcers - f/u on wound care eval - he wears TEDS at home- might need Unna boots once infection improves - IV Lasix given by admitting doctor to reduce pedal edema- swelling has improved- placed back on oral Lasix  Chronic systolic and diastolic CHF - last ECHO from 10/17 showing an EF of 40-45% and grade 1 d cHF - he is not on an ACE inhibitor possibly due to renal disease - cont Lasix    Hypothyroidism - cont Synthroid    Hereditary and idiopathic peripheral neuropathy - cont Gabapentin    Type 2 diabetes mellitus, controlled, with renal complications   - last I9J was 6.9 on 01/15/19 - he is not on diabetes medications as outpt - place on SSI while in the hospital  - repeat A1c is now 7.3 - I have placed him on SSI- will need to restrict diet- will ask for diet educations- follow sugars    Vascular dementia without behavioral disturbance  - follow   Vitamin B12 deficiency - B12 level low at 218-  I have started s/c B12 injections-     Chronic kidney disease, stage III (moderate)  - cont to follow    PAF (paroxysmal atrial fibrillation)  - per cardiology notes, not on anticoagulation apparently due to a GI bleed - currently in NSR  Hypothyroid - TSH checked in the hospital and noted to be 6.010 which may be sick euthyroid syndrome - will need to be rechecked in 3 wks  Time spent in minutes: 35 DVT prophylaxis: Lovenox Code Status: Full code Family Communication: spoke with daughter and POA [name] Disposition Plan: home when stable-  Consultants:   none Procedures:   none Antimicrobials:  Anti-infectives (From admission, onward)   Start     Dose/Rate Route Frequency Ordered Stop   03/31/19 2200  vancomycin (VANCOCIN) IVPB 750 mg/150 ml premix     750 mg 150 mL/hr over 60 Minutes Intravenous Every 48 hours 03/29/19 2052     03/30/19 0300  piperacillin-tazobactam (ZOSYN) IVPB 3.375 g     3.375 g 12.5 mL/hr over 240 Minutes Intravenous Every 8 hours 03/29/19 2052  03/29/19 1930  piperacillin-tazobactam (ZOSYN) IVPB 3.375 g     3.375 g 100 mL/hr over 30 Minutes Intravenous  Once 03/29/19 1919 03/29/19 2202   03/29/19 1930  vancomycin (VANCOCIN) 1,500 mg in sodium chloride 0.9 % 500 mL IVPB     1,500 mg 250 mL/hr over 120 Minutes Intravenous  Once 03/29/19 1919 03/30/19 0125       Objective: Vitals:   03/30/19 0441 03/30/19 1430 03/30/19 2013 03/31/19 0446  BP: 119/65 129/61 116/61 (!) 119/52  Pulse: (!) 51 (!) 56 65 65  Resp: 17 14 16 16   Temp: 98.9 F (37.2 C) (!) 97.5 F (36.4 C) 97.7 F (36.5 C) 97.6 F (36.4 C)  TempSrc:  Oral Oral Oral  SpO2: 100% 100% 97% 98%  Weight:      Height:        Intake/Output  Summary (Last 24 hours) at 03/31/2019 1021 Last data filed at 03/31/2019 1001 Gross per 24 hour  Intake 120 ml  Output 2150 ml  Net -2030 ml   Filed Weights   03/29/19 1900  Weight: 76.2 kg    Examination:   General exam: Appears comfortable  HEENT: PERRLA, oral mucosa moist, no sclera icterus or thrush Respiratory system: Clear to auscultation. Respiratory effort normal. Cardiovascular system: S1 & S2 heard,  No murmurs  Gastrointestinal system: Abdomen soft, non-tender, nondistended. Normal bowel sounds  Central nervous system: Alert and oriented. No focal neurological deficits. Extremities: No cyanosis, clubbing or edema Skin: No rashes - ulcers noted on lower extremities no discharge- erythema, swelling and tenderness appear to be improving Psychiatry:  Mood & affect appropriate.      Data Reviewed: I have personally reviewed following labs and imaging studies  CBC: Recent Labs  Lab 03/25/19 1230 03/29/19 1934 03/30/19 0343  WBC 8.1 5.2 4.5  NEUTROABS 5.7 2.6  --   HGB 12.7* 12.2* 12.2*  HCT 38.0 37.8* 37.7*  MCV 89 91.7 92.6  PLT 200 228 008   Basic Metabolic Panel: Recent Labs  Lab 03/25/19 1230 03/29/19 1934 03/30/19 0343  NA 137 134* 137  K 4.6 4.2 3.6  CL 99 103 104  CO2 20 19* 21*  GLUCOSE 122* 105* 90  BUN 33* 34* 30*  CREATININE 1.72* 1.67* 1.46*  CALCIUM 9.4 8.9 8.8*   GFR: Estimated Creatinine Clearance: 29.2 mL/min (A) (by C-G formula based on SCr of 1.46 mg/dL (H)). Liver Function Tests: Recent Labs  Lab 03/25/19 1230  AST 20  ALT 12  ALKPHOS 63  BILITOT 2.0*  PROT 7.1  ALBUMIN 4.2   No results for input(s): LIPASE, AMYLASE in the last 168 hours. No results for input(s): AMMONIA in the last 168 hours. Coagulation Profile: No results for input(s): INR, PROTIME in the last 168 hours. Cardiac Enzymes: No results for input(s): CKTOTAL, CKMB, CKMBINDEX, TROPONINI in the last 168 hours. BNP (last 3 results) No results for input(s):  PROBNP in the last 8760 hours. HbA1C: Recent Labs    03/30/19 1114  HGBA1C 7.3*   CBG: Recent Labs  Lab 03/31/19 0832  GLUCAP 98   Lipid Profile: No results for input(s): CHOL, HDL, LDLCALC, TRIG, CHOLHDL, LDLDIRECT in the last 72 hours. Thyroid Function Tests: Recent Labs    03/30/19 0343  TSH 6.010*   Anemia Panel: No results for input(s): VITAMINB12, FOLATE, FERRITIN, TIBC, IRON, RETICCTPCT in the last 72 hours. Urine analysis:    Component Value Date/Time   COLORURINE YELLOW 07/29/2018 [place] 08/04/2018 1618  LABSPEC 1.019 07/29/2018 1556   PHURINE 5.0 07/29/2018 1556   GLUCOSEU Negative 08/04/2018 1618   GLUCOSEU NEGATIVE 02/06/2013 1710   HGBUR NEGATIVE 07/29/2018 1556   HGBUR negative 12/30/2009 0858   BILIRUBINUR Negative 08/04/2018 1618   KETONESUR NEGATIVE 07/29/2018 1556   PROTEINUR Negative 08/04/2018 1618   PROTEINUR NEGATIVE 07/29/2018 1556   UROBILINOGEN 0.2 12/12/2015 1615   UROBILINOGEN 1.0 06/18/2015 1708   NITRITE Negative 08/04/2018 1618   NITRITE NEGATIVE 07/29/2018 1556   LEUKOCYTESUR Negative 08/04/2018 1618   Sepsis Labs: @LABRCNTIP (procalcitonin:4,lacticidven:4) ) Recent Results (from the past 240 hour(s))  Blood culture (routine x 2)     Status: None (Preliminary result)   Collection Time: 03/29/19  7:34 PM   Specimen: Right Antecubital; Blood  Result Value Ref Range Status   Specimen Description RIGHT ANTECUBITAL  Final   Special Requests   Final    BOTTLES DRAWN AEROBIC AND ANAEROBIC Blood Culture adequate volume   Culture   Final    NO GROWTH 2 DAYS Performed at [facility], [place] [address] [place], [place] [address]    Report Status PENDING  Incomplete  SARS Coronavirus 2 (CEPHEID - Performed in [place] hospital lab), Hosp Order     Status: None   Collection Time: 03/29/19  7:34 PM   Specimen: Nasopharyngeal Swab  Result Value Ref Range Status   SARS Coronavirus 2 NEGATIVE NEGATIVE Final     Comment: (NOTE) If result is NEGATIVE SARS-CoV-2 target nucleic acids are NOT DETECTED. The SARS-CoV-2 RNA is generally detectable in upper and lower  respiratory specimens during the acute phase of infection. The lowest  concentration of SARS-CoV-2 viral copies this assay can detect is 250  copies / mL. A negative result does not preclude SARS-CoV-2 infection  and should not be used as the sole basis for treatment or other  patient management decisions.  A negative result may occur with  improper specimen collection / handling, submission of specimen other  than nasopharyngeal swab, presence of viral mutation(s) within the  areas targeted by this assay, and inadequate number of viral copies  (<250 copies / mL). A negative result must be combined with clinical  observations, patient history, and epidemiological information. If result is POSITIVE SARS-CoV-2 target nucleic acids are DETECTED. The SARS-CoV-2 RNA is generally detectable in upper and lower  respiratory specimens dur ing the acute phase of infection.  Positive  results are indicative of active infection with SARS-CoV-2.  Clinical  correlation with patient history and other diagnostic information is  necessary to determine patient infection status.  Positive results do  not rule out bacterial infection or co-infection with other viruses. If result is PRESUMPTIVE POSTIVE SARS-CoV-2 nucleic acids MAY BE PRESENT.   A presumptive positive result was obtained on the submitted specimen  and confirmed on repeat testing.  While 2019 novel coronavirus  (SARS-CoV-2) nucleic acids may be present in the submitted sample  additional confirmatory testing may be necessary for epidemiological  and / or clinical management purposes  to differentiate between  SARS-CoV-2 and other Sarbecovirus currently known to infect humans.  If clinically indicated additional testing with an alternate test  methodology [phone]) is advised. The SARS-CoV-2  RNA is generally  detectable in upper and lower respiratory sp ecimens during the acute  phase of infection. The expected result is Negative. Fact Sheet for Patients:  StrictlyIdeas.no Fact Sheet for Healthcare Providers: BankingDealers.co.za This test is not yet approved or cleared by the [place] FDA and  has been authorized for detection and/or diagnosis of SARS-CoV-2 by FDA under an Emergency Use Authorization (EUA).  This EUA will remain in effect (meaning this test can be used) for the duration of the COVID-19 declaration under Section 564(b)(1) of the Act, 21 U.S.C. section 360bbb-3(b)(1), unless the authorization is terminated or revoked sooner. Performed at [facility], [place] [address], [place], [place] [address]   Blood culture (routine x 2)     Status: None (Preliminary result)   Collection Time: 03/29/19  7:50 PM   Specimen: BLOOD  Result Value Ref Range Status   Specimen Description BLOOD LEFT ANTECUBITAL  Final   Special Requests   Final    BOTTLES DRAWN AEROBIC AND ANAEROBIC Blood Culture adequate volume   Culture   Final    NO GROWTH 2 DAYS Performed at [facility], [place] [address], [place], [place] [address]    Report Status PENDING  Incomplete         Radiology Studies: No results found.    Scheduled Meds: . aspirin EC  81 mg Oral Daily  . cyanocobalamin  1,000 mcg Subcutaneous Daily  . enoxaparin (LOVENOX) injection  30 mg Subcutaneous Q24H  . furosemide  40 mg Oral Daily  . gabapentin  600 mg Oral TID  . insulin aspart  0-15 Units Subcutaneous TID WC  . isosorbide mononitrate  30 mg Oral Daily  . levothyroxine  50 mcg Oral QODAY  . levothyroxine  75 mcg Oral QODAY  . pantoprazole  40 mg Oral Daily  . potassium chloride SA  20 mEq Oral BID   Continuous Infusions: . piperacillin-tazobactam (ZOSYN)  IV 3.375 g (03/31/19 0956)  . vancomycin       LOS: 2 days      Debbe Odea,  MD [facility] Pager: [email or web address] Password [facility] 03/31/2019, 10:21 AM

## 2019-03-31 NOTE — Progress Notes (Signed)
Kerlix and ace wrap removed from legs as patient refuses to wear them.

## 2019-03-31 NOTE — TOC Initial Note (Signed)
Transition of Care [facility]) - Initial/Assessment Note    Patient Details  Name: Trevell Elliott MRN: [id number] Date of Birth: 04/21/1928  Transition of Care [facility]) CM/SW Contact:    [name] Elliott, [place] Phone Number: 03/31/2019, 11:33 AM  Clinical Narrative:                 CSW spoke with pt, introduced self, role, reason for call. CSW requested permission to call daughter [name] Elliott to discuss care planning, pt amenable and declines SNF placement at this time stating "I went to one once and I don't want to ever go back."   CSW left HIPAA compliant message for [name] Elliott, await return call.  Expected Discharge Plan: [place] Barriers to Discharge: Insurance Authorization, Continued Medical Work up   Patient Goals and CMS Choice Patient states their goals for this hospitalization and ongoing recovery are:: I want to go home CMS Medicare.gov Compare Post Acute Care list provided to:: Patient Represenative (must comment)(pt daughter) Choice offered to / list presented to : Adult Children  Expected Discharge Plan and Services Expected Discharge Plan: [place] In-house Referral: Clinical Social Work Discharge Planning Services: CM Consult Post Acute Care Choice: [place] arrangements for the past 2 months: [place]                                      Prior Living Arrangements/Services Living arrangements for the past 2 months: Single Family Home Lives with:: Self Patient language and need for interpreter reviewed:: Yes(no needs) Do you feel safe going back to the place where you live?: Yes      Need for Family Participation in Patient Care: Yes (Comment)(assistance w/ adls and iadls; support with decision making) Care giver support system in place?: Yes (comment)(adult children; grandchildren) Current home services: DME Criminal Activity/Legal Involvement Pertinent to Current Situation/Hospitalization: No - Comment as  needed  Activities of Daily Living Home Assistive Devices/Equipment: Cane (specify quad or straight) ADL Screening (condition at time of admission) Patient's cognitive ability adequate to safely complete daily activities?: Yes Is the patient deaf or have difficulty hearing?: 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) Does the patient have difficulty seeing, even when wearing glasses/contacts?: Yes(difficulty seeing left eye) Does the patient have difficulty concentrating, remembering, or making decisions?: No Patient able to express need for assistance with ADLs?: No Does the patient have difficulty dressing or bathing?: No Independently performs ADLs?: Yes (appropriate for developmental age) Does the patient have difficulty walking or climbing stairs?: Yes Weakness of Legs: Both Weakness of Arms/Hands: None  Permission Sought/Granted Permission sought to share information with : Family Supports Permission granted to share information with : Yes, Verbal Permission Granted  Share Information with NAME: [name] Elliott     Permission granted to share info w Relationship: daughter  Permission granted to share info w Contact Information: [phone]  Emotional Assessment Appearance:: Appears stated age Attitude/Demeanor/Rapport: Engaged Affect (typically observed): Appropriate Orientation: : Oriented to Place, Oriented to Self, Oriented to  Time, Oriented to Situation Alcohol / Substance Use: Not Applicable Psych Involvement: No (comment)  Admission diagnosis:  Cellulitis of right lower extremity [L03.115] Patient Active Problem List   Diagnosis Date Noted  . Chronic combined systolic and diastolic CHF (congestive heart failure) ([place]) 03/30/2019  . Cellulitis, leg 03/29/2019  . Memory loss 12/05/2017  . Orthostatic dizziness 12/05/2017  . Idiopathic peripheral neuropathy 12/05/2017  .  PAF (paroxysmal atrial fibrillation) ([place]) 04/30/2017  . Shortness of breath 06/04/2016  . Chronic systolic CHF  (congestive heart failure) ([place]) 06/04/2016  . Bilateral leg edema 12/12/2015  . Dizziness 09/27/2015  . Primary localized osteoarthritis of left knee 07/12/2015  . Weakness 02/23/2015  . DVT (deep venous thrombosis) ([place]) 12/14/2014  . Combined congestive systolic and diastolic heart failure ([place]) 12/14/2014  . Cerebral infarction due to unspecified mechanism   . TIA (transient ischemic attack) 08/01/2014  . Varicose veins of both legs with edema 05/07/2014  . Bilateral leg pain 04/16/2014  . Chronic kidney disease, stage III (moderate) ([place]) 12/21/2013  . Vascular dementia without behavioral disturbance ([place])   . Type 2 diabetes mellitus, controlled, with renal complications ([place]) 10/14/1550  . Diastolic dysfunction 04/05/2335  . Knee pain, bilateral 03/01/2013  . Hereditary and idiopathic peripheral neuropathy 11/26/2012  . Osteoporosis, unspecified 11/26/2012  . GI bleed due to NSAIDs, DDX=Isch colitis, infectious colitis 11/19/2012  . Cough 05/29/2012  . 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 CONSTIPATION, CHRONIC 08/02/2010  . INSOMNIA, CHRONIC 03/08/2010  . Hypothyroidism 01/04/2010  . VITAMIN B12 DEFICIENCY 01/04/2010  . HYPERCHOLESTEROLEMIA 08/12/2009  . Essential hypertension 08/12/2009  . CAD (coronary artery disease) 08/12/2009  . GERD 08/12/2009   PCP:  [name] Guys, MD Pharmacy:   [place], [address] [place] [address] Phone: [phone] Fax: [phone]     Social Determinants of Health (SDOH) Interventions    Readmission Risk Interventions No flowsheet data found.

## 2019-03-31 NOTE — Consult Note (Addendum)
[facility] Nurse wound consult note Patient receiving care in [place] Reason for Consult: leg wounds.  This consult was completed remotely by review of record, including images of BLE. Wound type: areas consistent with venous stasis.  Hemosiderin staining of gaiter areas of BLE present, as well as edema.  Patient currently being treated for cellulitis. Measurement: To be provided by the bedside RN in the flowsheet section Wound bed: To be provided by the bedside RN in the flowsheet section Drainage (amount, consistency, odor) To be provided by the bedside RN in the flowsheet section Periwound: erythematous, edematous Dressing procedure/placement/frequency: Place foam dressings over wounds on legs. Starting just behind the toes, spiral wrap kerlex to just below the knee, then spiral wrap ace wraps in the same manner. Remove wraps and check areas each day, then replace wraps. Monitor the wound area(s) for worsening of condition such as: Signs/symptoms of infection,  Increase in size,  Development of or worsening of odor, Development of pain, or increased pain at the affected locations.  Notify the medical team if any of these develop.  Thank you for the consult. [facility] nurse will not follow at this time.  Please re-consult the [facility] team if needed.  Val Riles, RN, MSN, CWOCN, CNS-BC, pager [phone]

## 2019-04-01 LAB — BASIC METABOLIC PANEL
Anion gap: 10 (ref 5–15)
BUN: 41 mg/dL — ABNORMAL HIGH (ref 8–23)
CO2: 22 mmol/L (ref 22–32)
Calcium: 8.5 mg/dL — ABNORMAL LOW (ref 8.9–10.3)
Chloride: 100 mmol/L (ref 98–111)
Creatinine, Ser: 1.95 mg/dL — ABNORMAL HIGH (ref 0.61–1.24)
GFR calc Af Amer: 35 mL/min — ABNORMAL LOW (ref >60)
GFR calc non Af Amer: 30 mL/min — ABNORMAL LOW (ref >60)
Glucose, Bld: 145 mg/dL — ABNORMAL HIGH (ref 70–99)
Potassium: 3.6 mmol/L (ref 3.5–5.1)
Sodium: 132 mmol/L — ABNORMAL LOW (ref 135–145)

## 2019-04-01 LAB — GLUCOSE, CAPILLARY
Glucose-Capillary: 105 mg/dL — ABNORMAL HIGH (ref 70–99)
Glucose-Capillary: 110 mg/dL — ABNORMAL HIGH (ref 70–99)

## 2019-04-01 MED ORDER — CEPHALEXIN 250 MG PO CAPS: 250.0000 mg | ORAL_CAPSULE | Freq: Three times a day (TID) | ORAL | 0 refills | Status: AC

## 2019-04-01 MED ORDER — FUROSEMIDE 10 MG/ML IJ SOLN
20.0000 mg | Freq: Once | INTRAMUSCULAR | Status: AC
  Administered 2019-04-01: 20 mg via INTRAVENOUS
  Filled 2019-04-01: qty 2

## 2019-04-01 NOTE — Discharge Instructions (Signed)
Heart Healthy, Consistent Carbohydrate Nutrition Therapy   A heart-healthy and consistent carbohydrate diet is recommended to manage heart disease and diabetes. To follow a heart-healthy and consistent carbohydrate diet, Eat a balanced diet with whole grains, fruits and vegetables, and lean protein sources.  Choose heart-healthy unsaturated fats. Limit saturated fats, trans fats, and cholesterol intake. Eat more plant-based or vegetarian meals using beans and soy foods for protein.  Eat whole, unprocessed foods to limit the amount of sodium (salt) you eat.  Choose a consistent amount of carbohydrate at each meal and snack. Limit refined carbohydrates especially sugar, sweets and sugar-sweetened beverages.  If you drink alcohol, do so in moderation: one serving per day (women) and two servings per day (men). o One serving is equivalent to 12 ounces beer, 5 ounces wine, or 1.5 ounces distilled spirits  Tips Tips for Choosing Heart-Healthy Fats Choose lean protein and low-fat dairy foods to reduce saturated fat intake. Saturated fat is usually found in animal-based protein and is associated with certain health risks. Saturated fat is the biggest contributor to raise low-density lipoprotein (LDL) cholesterol levels. Research shows that limiting saturated fat lowers unhealthy cholesterol levels. Eat no more than 7% of your total calories each day from saturated fat. Ask your RDN to help you determine how much saturated fat is right for you. There are many foods that do not contain large amounts of saturated fats. Swapping these foods to replace foods high in saturated fats will help you limit the saturated fat you eat and improve your cholesterol levels. You can also try eating more plant-based or vegetarian meals. Instead of. Try:  Whole milk, cheese, yogurt, and ice cream 1% or skim milk, low-fat cheese, non-fat yogurt, and low-fat ice cream  Fatty, marbled beef and pork Lean beef, pork, or venison   Poultry with skin Poultry without skin  Butter, stick margarine Reduced-fat, whipped, or liquid spreads  Coconut oil, palm oil Liquid vegetable oils: corn, canola, olive, soybean and safflower oils   Avoid foods that contain trans fats. Trans fats increase levels of LDL-cholesterol. Hydrogenated fat in processed foods is the main source of trans fats in foods.  Trans fats can be found in stick margarine, shortening, processed sweets, baked goods, some fried foods, and packaged foods made with hydrogenated oils. Avoid foods with "partially hydrogenated oil" on the ingredient list such as: cookies, pastries, baked goods, biscuits, crackers, microwave popcorn, and frozen dinners. Choose foods with heart healthy fats. Polyunsaturated and monounsaturated fat are unsaturated fats that may help lower your blood cholesterol level when used in place of saturated fat in your diet. Ask your RDN about taking a dietary supplement with plant sterols and stanols to help lower your cholesterol level. Research shows that substituting saturated fats with unsaturated fats is beneficial to cholesterol levels. Try these easy swaps: Instead of. Try:  Butter, stick margarine, or solid shortening Reduced-fat, whipped, or liquid spreads  Beef, pork, or poultry with skin Fish and seafood  Chips, crackers, snack foods Raw or unsalted nuts and seeds or nut butters Hummus with vegetables Avocado on toast  Coconut oil, palm oil Liquid vegetable oils: corn, canola, olive, soybean and safflower oils  Limit the amount of cholesterol you eat to less than 200 milligrams per day. Cholesterol is a substance carried through the bloodstream via lipoproteins, which are known as "transporters" of fat. Some body functions need cholesterol to work properly, but too much cholesterol in the bloodstream can damage arteries and build up blood vessel linings (  which can lead to heart attack and stroke). You should eat less than 200 milligrams  cholesterol per day. People respond differently to eating cholesterol. There is no test available right now that can figure out which people will respond more to dietary cholesterol and which will respond less. For individuals with high intake of dietary cholesterol, different types of increase (none, small, moderate, large) in LDL-cholesterol levels are all possible.  Food sources of cholesterol include egg yolks and organ meats such as liver, gizzards. Limit egg yolks to two to four per week and avoid organ meats like liver and gizzards to control cholesterol intake. Tips for Choosing Heart-Healthy Carbohydrates Consume a consistent amount of carbohydrate It is important to eat foods with carbohydrates in moderation because they impact your blood glucose level. Carbohydrates can be found in many foods such as: Grains (breads, crackers, rice, pasta, and cereals)  Starchy Vegetables (potatoes, corn, and peas)  Beans and legumes  Milk, soy milk, and yogurt  Fruit and fruit juice  Sweets (cakes, cookies, ice cream, jam and jelly) Your RDN will help you set a goal for how many carbohydrate servings to eat at your meals and snacks. For many adults, eating 3 to 5 servings of carbohydrate foods at each meal and 1 or 2 carbohydrate servings for each snack works well.  Check your blood glucose level regularly. It can tell you if you need to adjust when you eat carbohydrates. Choose foods rich in viscous (soluble) fiber Viscous, or soluble, is found in the walls of plant cells. Viscous fiber is found only in plant-based foods. Eating foods with fiber helps to lower your unhealthy cholesterol and keep your blood glucose in range  Rich sources of viscous fiber include vegetables (asparagus, Brussels sprouts, sweet potatoes, turnips) fruit (apricots, mangoes, oranges), legumes, and whole grains (barley, oats, and oat bran).  As you increase your fiber intake gradually, also increase the amount of water you  drink. This will help prevent constipation.  If you have difficulty achieving this goal, ask your RDN about fiber laxatives. Choose fiber supplements made with viscous fibers such as psyllium seed husks or methylcellulose to help lower unhealthy cholesterol.  Limit refined carbohydrates  There are three types of carbohydrates: starches, sugar, and fiber. Some carbohydrates occur naturally in food, like the starches in rice or corn or the sugars in fruits and milk. Refined carbohydrates--foods with high amounts of simple sugars--can raise triglyceride levels. High triglyceride levels are associated with coronary heart disease. Some examples of refined carbohydrate foods are table sugar, sweets, and beverages sweetened with added sugar. Tips for Reducing Sodium (Salt) Although sodium is important for your body to function, too much sodium can be harmful for people with high blood pressure. As sodium and fluid buildup in your tissues and bloodstream, your blood pressure increases. High blood pressure may cause damage to other organs and increase your risk for a stroke. Even if you take a pill for blood pressure or a water pill (diuretic) to remove fluid, it is still important to have less salt in your diet. Ask your doctor and RDN what amount of sodium is right for you. Avoid processed foods. Eat more fresh foods.  Fresh fruits and vegetables are naturally low in sodium, as well as frozen vegetables and fruits that have no added juices or sauces.  Fresh meats are lower in sodium than processed meats, such as bacon, sausage, and hotdogs. Read the nutrition label or ask your butcher to help you find a   fresh meat that is low in sodium. Eat less salt--at the table and when cooking.  A single teaspoon of table salt has 2,300 mg of sodium.  Leave the salt out of recipes for pasta, casseroles, and soups.  Ask your RDN how to cook your favorite recipes without sodium Be a smart shopper.  Look for food packages  that say "salt-free" or "sodium-free." These items contain less than 5 milligrams of sodium per serving.  "Very low-sodium" products contain less than 35 milligrams of sodium per serving.  "Low-sodium" products contain less than 140 milligrams of sodium per serving.  Beware for "Unsalted" or "No Added Salt" products. These items may still be high in sodium. Check the nutrition label. Add flavors to your food without adding sodium.  Try lemon juice, lime juice, fruit juice or vinegar.  Dry or fresh herbs add flavor. Try basil, bay leaf, dill, rosemary, parsley, sage, dry mustard, nutmeg, thyme, and paprika.  Pepper, red pepper flakes, and cayenne pepper can add spice t your meals without adding sodium. Hot sauce contains sodium, but if you use just a drop or two, it will not add up to much.  Buy a sodium-free seasoning blend or make your own at home. Additional Lifestyle Tips Achieve and maintain a healthy weight. Talk with your RDN or your doctor about what is a healthy weight for you. Set goals to reach and maintain that weight.  To lose weight, reduce your calorie intake along with increasing your physical activity. A weight loss of 10 to 15 pounds could reduce LDL-cholesterol by 5 milligrams per deciliter. Participate in physical activity. Talk with your health care team to find out what types of physical activity are best for you. Set a plan to get about 30 minutes of exercise on most days.  Foods Recommended Food Group Foods Recommended  Grains Whole grain breads and cereals, including whole wheat, barley, rye, buckwheat, corn, teff, quinoa, millet, amaranth, brown or wild rice, sorghum, and oats Pasta, especially whole wheat or other whole grain types  Brown rice, quinoa or wild rice Whole grain crackers, bread, rolls, pitas Home-made bread with reduced-sodium baking soda  Protein Foods Lean cuts of beef and pork (loin, leg, round, extra lean hamburger)  Skinless poultry Fish Venison  and other wild game Dried beans and peas Nuts and nut butters Meat alternatives made with soy or textured vegetable protein  Egg whites or egg substitute Cold cuts made with lean meat or soy protein  Dairy Nonfat (skim), low-fat, or 1%-fat milk  Nonfat or low-fat yogurt or cottage cheese Fat-free and low-fat cheese  Vegetables Fresh, frozen, or canned vegetables without added fat or salt   Fruits Fresh, frozen, canned, or dried fruit   Oils Unsaturated oils (corn, olive, peanut, soy, sunflower, canola)  Soft or liquid margarines and vegetable oil spreads  Salad dressings Seeds and nuts  Avocado   Foods Not Recommended Food Group Foods Not Recommended  Grains Breads or crackers topped with salt Cereals (hot or cold) with more than 300 mg sodium per serving Biscuits, cornbread, and other "quick" breads prepared with baking soda Bread crumbs or stuffing mix from a store High-fat bakery products, such as doughnuts, biscuits, croissants, danish pastries, pies, cookies Instant cooking foods to which you add hot water and stir--potatoes, noodles, rice, etc. Packaged starchy foods--seasoned noodle or rice dishes, stuffing mix, macaroni and cheese dinner Snacks made with partially hydrogenated oils, including chips, cheese puffs, snack mixes, regular crackers, butter-flavored popcorn  Protein Foods   Higher-fat cuts of meats (ribs, t-bone steak, regular hamburger) Bacon, sausage, or hot dogs Cold cuts, such as salami or bologna, deli meats, cured meats, corned beef Organ meats (liver, brains, gizzards, sweetbreads) Poultry with skin Fried or smoked meat, poultry, and fish Whole eggs and egg yolks (more than 2-4 per week) Salted legumes, nuts, seeds, or nut/seed butters Meat alternatives with high levels of sodium (>300 mg per serving) or saturated fat (>5 g per serving)  Dairy Whole milk,?2% fat milk, buttermilk Whole milk yogurt or ice cream Cream Half-&-half Cream cheese Sour  cream Cheese  Vegetables Canned or frozen vegetables with salt, fresh vegetables prepared with salt, butter, cheese, or cream sauce Fried vegetables Pickled vegetables such as olives, pickles, or sauerkraut  Fruits Fried fruits Fruits served with butter or cream  Oils Butter, stick margarine, shortening Partially hydrogenated oils or trans fats Tropical oils (coconut, palm, palm kernel oils)  Other Candy, sugar sweetened soft drinks and desserts Salt, sea salt, garlic salt, and seasoning mixes containing salt Bouillon cubes Ketchup, barbecue sauce, Worcestershire sauce, soy sauce, teriyaki sauce Miso Salsa Pickles, olives, relish   Heart Healthy Consistent Carbohydrate Vegetarian (Lacto-Ovo) Sample 1-Day Menu  Breakfast 1 cup oatmeal, cooked (2 carbohydrate servings)   cup blueberries (1 carbohydrate serving)  11 almonds, without salt  1 cup 1% milk (1 carbohydrate serving)  1 cup coffee  Morning Snack 1 cup fat-free plain yogurt (1 carbohydrate serving)  Lunch 1 whole wheat bun (1 carbohydrate servings)  1 black bean burger (1 carbohydrate servings)  1 slice cheddar cheese, low sodium  2 slices tomatoes  2 leaves lettuce  1 teaspoon mustard  1 small pear (1 carbohydrate servings)  1 cup green tea, unsweetened  Afternoon Snack 1/3 cup trail mix with nuts, seeds, and raisins, without salt (1 carbohydrate servinga)  Evening Meal  cup meatless chicken  2/3 cup brown rice, cooked (2 carbohydrate servings)  1 cup broccoli, cooked (2/3 carbohydrate serving)   cup carrots, cooked (1/3 carbohydrate serving)  2 teaspoons olive oil  1 teaspoon balsamic vinegar  1 whole wheat dinner roll (1 carbohydrate serving)  1 teaspoon margarine, soft, tub  1 cup 1% milk (1 carbohydrate serving)  Evening Snack 1 extra small banana (1 carbohydrate serving)  1 tablespoon peanut butter   Heart Healthy Consistent Carbohydrate Vegan Sample 1-Day Menu  Breakfast 1 cup oatmeal, cooked (2  carbohydrate servings)   cup blueberries (1 carbohydrate serving)  11 almonds, without salt  1 cup soymilk fortified with calcium, vitamin B12, and vitamin D  1 cup coffee  Morning Snack 6 ounces soy yogurt (1 carbohydrate servings)  Lunch 1 whole wheat bun(1 carbohydrate servings)  1 black bean burger (1 carbohydrate serving)  2 slices tomatoes  2 leaves lettuce  1 teaspoon mustard  1 small pear (1 carbohydrate servings)  1 cup green tea, unsweetened  Afternoon Snack 1/3 cup trail mix with nuts, seeds, and raisins, without salt (1 carbohydrate servings)  Evening Meal  cup meatless chicken  2/3 cup brown rice, cooked (2 carbohydrate servings)  1 cup broccoli, cooked (2/3 carbohydrate serving)   cup carrots, cooked (1/3 carbohydrate serving)  2 teaspoons olive oil  1 teaspoon balsamic vinegar  1 whole wheat dinner roll (1 carbohydrate serving)  1 teaspoon margarine, soft, tub  1 cup soymilk fortified with calcium, vitamin B12, and vitamin D  Evening Snack 1 extra small banana (1 carbohydrate serving)  1 tablespoon peanut butter    Heart Healthy Consistent Carbohydrate Sample   teaspoon balsamic vinegar  1 whole wheat dinner roll (1 carbohydrate serving)  1 teaspoon margarine, soft, tub  1 cup soymilk fortified with calcium, vitamin B12, and vitamin D  Evening Snack 1 extra small banana (1 carbohydrate serving)  1 tablespoon peanut butter    Heart Healthy Consistent Carbohydrate Sample 1-Day Menu  Breakfast 1 cup cooked oatmeal (2 carbohydrate servings)  3/4 cup blueberries (1 carbohydrate serving)  1 ounce almonds  1 cup skim milk (1 carbohydrate serving)  1 cup coffee  Morning Snack 1 cup sugar-free nonfat yogurt (1 carbohydrate serving)  Lunch 2 slices whole-wheat bread (2 carbohydrate servings)  2 ounces lean [place] breast  1 ounce low-fat Swiss cheese  1 teaspoon mustard  1 slice tomato  1 lettuce leaf  1 small pear (1 carbohydrate serving)  1 cup skim milk (1 carbohydrate serving)  Afternoon Snack 1 ounce trail mix with unsalted nuts, seeds, and raisins (1 carbohydrate serving)  Evening Meal 3 ounces salmon  2/3 cup cooked brown rice (2 carbohydrate servings)  1 teaspoon  soft margarine  1 cup cooked broccoli with 1/2 cup cooked carrots (1 carbohydrate serving  Carrots, cooked, boiled, drained, without salt  1 cup lettuce  1 teaspoon olive oil with vinegar for dressing  1 small whole grain roll (1 carbohydrate serving)  1 teaspoon soft margarine  1 cup unsweetened tea  Evening Snack 1 extra-small banana (1 carbohydrate serving)  Copyright 2020  [employer]. All rights reserved.   Wound care: non-adherant pad wrapped with gauze -- change daily Monitor the wound area(s) for worsening of condition such as: Increase in size,  Development of or worsening of odor, Development of pain, or increased pain at the affected locations Elevate extremity

## 2019-04-01 NOTE — Discharge Summary (Signed)
Physician Discharge Summary  [name] Elliott ZTI:[id number] DOB: October 15, 1927 DOA: 03/29/2019  PCP: Rutherford Guys, MD  Admit date: 03/29/2019 Discharge date: 04/01/2019  Time spent: 45 minutes  Recommendations for Outpatient Follow-up:  1. Follow up with PCP 1-2 weeks for evaluation of symptoms and blood sugar control, TSH, B12 level 2. Home health requested for assistance with wound check/dressing changes.    Discharge Diagnoses:  Principal Problem:   Cellulitis, leg Active Problems:   Hypothyroidism   Essential hypertension   Hereditary and idiopathic peripheral neuropathy   Type 2 diabetes mellitus, controlled, with renal complications (HCC)   Vascular dementia without behavioral disturbance (HCC)   Chronic kidney disease, stage III (moderate) (HCC)   Varicose veins of both legs with edema   PAF (paroxysmal atrial fibrillation) (HCC)   Chronic combined systolic and diastolic CHF (congestive heart failure) ([place])   Discharge Condition: stable  Diet recommendation: heart healthy  Filed Weights   03/29/19 1900  Weight: 76.2 kg    History of present illness:  Patient is an 85 year old Caucasian male with multiple medical and cardiac history.  Patient carries diagnosis of vascular dementia, chronic kidney disease stage III, hypertension, thrombocytopenia, CVA, hyperlipidemia, PVCs, DVT, diabetes mellitus, diastolic congestive heart failure, atrial fibrillation and coronary artery disease amongst other medical and cardiac problems.  Patient has been on amoxicillin and doxycycline for cellulitis of bilateral lower extremities without any significant improvement.  Patient presented 7/26 with bilateral lower extremity cellulitis that had not responded to oral antibiotics on outpatient basis.  Patient's ability to give history is limited due to the dementing process.  Specifically, patient denied fever, chills, headache, neck pain, URI symptoms, chest pain, shortness of breath, GI symptoms  or urinary symptoms.  Patient be admitted for further assessment and management of bilateral cellulitis of lower extremities that did not respond to oral antibiotics on outpatient basis.  Hospital Course:  Cellulitis of Lower extermities - infection likely related to venous ulcers on legs. Less swelling and erythema. Right leg remains tender and more swollen than left. Reportedly  he  failed Doxycycline and Amoxil which was ordered by his PCP as outpt but unclear how long he took these meds. He received  Vanc and Zosyn while in hospital. He was evaluated by  PT  recommended SNF- he refuses. Will discharge with oral antibiotics and HH PT and RN  Active Problems:   Varicose veins of both legs with edema and ulcers daily dressing changes with non-stick. Consider Unna boots once infection improves. Home meds include lasix. He was also provided with IV lasix x2.  Chronic systolic and diastolic CHF - last ECHO from 10/17 showing an EF of 40-45% and grade 1 d cHF. Not taking an ACE inhibitor possibly due to renal disease. Compensated at discharge   Hypothyroidism - cont Synthroid    Hereditary and idiopathic peripheral neuropathy - cont Gabapentin    Type 2 diabetes mellitus, controlled, with renal complications  last A2N was 7.3. Not on diabetes medications as outpt. CBG 110 at discharge. OP follow up.     Vascular dementia without behavioral disturbance stable at baseline  Vitamin B12 deficiency - B12 level low at 218. He received  B12 injections. OP follow up     Chronic kidney disease, stage III (moderate) stable at baseline.     PAF (paroxysmal atrial fibrillation)  - per cardiology notes, not on anticoagulation apparently due to a GI bleed. Remained in  NSR  Hypothyroid - TSH checked in the hospital and  noted to be 6.010 which may be sick euthyroid syndrome. will need to be rechecked in 3 wks  Procedures:    Consultations:  Wound care  Discharge Exam: Vitals:    03/31/19 2012 04/01/19 0634  BP: 110/61 (!) 128/55  Pulse: 63 60  Resp: 16 16  Temp: 98 F (36.7 C) 97.9 F (36.6 C)  SpO2: 99% 98%    General: awake alert HOH no acute distress Cardiovascular: rrr no mgr right leg with mild erythema and wound on shin. No odor. Left leg with slight edema and less erythema than right. Mild tenderness to palpation on right Respiratory: normal effort BS clear bilaterally no wheeze  Discharge Instructions   Discharge Instructions    Call MD for:  persistant dizziness or light-headedness   Complete by: As directed    Call MD for:  temperature >100.4   Complete by: As directed    Diet - low sodium heart healthy   Complete by: As directed    Discharge instructions   Complete by: As directed    Take medications as prescribed Follow up with PCP for evaluation of symptoms Home health for assistance with dressing changes   Increase activity slowly   Complete by: As directed      Allergies as of 04/01/2019      Reactions   Crestor [rosuvastatin Calcium] Other (See Comments)   Muscle pain/aches   Mestinon [pyridostigmine Bromide Er] Other (See Comments)   GI upset, stomach cramps      Medication List    STOP taking these medications   amoxicillin 500 MG capsule Commonly known as: AMOXIL   doxycycline 100 MG tablet Commonly known as: VIBRA-TABS     TAKE these medications   aspirin EC 81 MG tablet Take 81 mg by mouth daily.   bismuth subsalicylate 443 XV/40GQ suspension Commonly known as: PEPTO BISMOL Take 30 mLs by mouth daily as needed for indigestion or diarrhea or loose stools.   cephALEXin 250 MG capsule Commonly known as: Keflex Take 1 capsule (250 mg total) by mouth 3 (three) times daily for 3 days.   diclofenac sodium 1 % Gel Commonly known as: VOLTAREN Apply 2 g topically 4 (four) times daily.   docusate sodium 100 MG capsule Commonly known as: COLACE Take 1 capsule (100 mg total) by mouth every 12 (twelve) hours.    furosemide 40 MG tablet Commonly known as: LASIX Take 1 tablet (40 mg total) by mouth daily.   gabapentin 300 MG capsule Commonly known as: NEURONTIN TAKE 2 CAPSULES BY MOUTH THREE TIMES DAILY   isosorbide mononitrate 30 MG 24 hr tablet Commonly known as: IMDUR Take 1 tablet (30 mg total) by mouth daily.   levothyroxine 75 MCG tablet Commonly known as: SYNTHROID Take 1 tablet (75 mcg total) by mouth every other day. 30 minutes before breakfast. Alternate with 106mcg tablet.   levothyroxine 50 MCG tablet Commonly known as: SYNTHROID Take 1 tablet (50 mcg total) by mouth every other day. 30 minutes before breakfast. Alternated with 10mcg tablet.   lidocaine 5 % Commonly known as: LIDODERM Place 1 patch onto the skin daily. Remove & Discard patch within 12 hours or as directed by MD   magnesium hydroxide 400 MG/5ML suspension Commonly known as: MILK OF MAGNESIA Take 15 mLs by mouth daily as needed for mild constipation or moderate constipation.   nitroGLYCERIN 0.4 MG SL tablet Commonly known as: NITROSTAT Place 1 tablet (0.4 mg total) under the tongue every 5 (five) minutes as  needed for chest pain.   omeprazole 20 MG capsule Commonly known as: PRILOSEC Take 1 capsule (20 mg total) by mouth 2 (two) times daily before a meal.   potassium chloride SA 20 MEQ tablet Commonly known as: K-DUR Take 1 tablet (20 mEq total) by mouth 2 (two) times daily.      Allergies  Allergen Reactions  . Crestor [Rosuvastatin Calcium] Other (See Comments)    Muscle pain/aches  . Mestinon [Pyridostigmine Bromide Er] Other (See Comments)    GI upset, stomach cramps      The results of significant diagnostics from this hospitalization (including imaging, microbiology, ancillary and laboratory) are listed below for reference.    Significant Diagnostic Studies: Dg Tibia/fibula Right  Result Date: 03/25/2019 CLINICAL DATA:  Cellulitis of right leg EXAM: RIGHT TIBIA AND FIBULA - 2 VIEW  COMPARISON:  None. FINDINGS: Diffuse vascular calcifications. Scattered soft tissue subcutaneous calcifications. No acute bony abnormality. Specifically, no fracture, subluxation, or dislocation. No radiographic changes of osteomyelitis. IMPRESSION: No acute bony abnormality. Electronically Signed   By: Rolm Baptise M.D.   On: 03/25/2019 12:41    Microbiology: Recent Results (from the past 240 hour(s))  Blood culture (routine x 2)     Status: None (Preliminary result)   Collection Time: 03/29/19  7:34 PM   Specimen: Right Antecubital; Blood  Result Value Ref Range Status   Specimen Description RIGHT ANTECUBITAL  Final   Special Requests   Final    BOTTLES DRAWN AEROBIC AND ANAEROBIC Blood Culture adequate volume   Culture   Final    NO GROWTH 3 DAYS Performed at [facility], [address] [place], [place] [address]    Report Status PENDING  Incomplete  SARS Coronavirus 2 (CEPHEID - Performed in [place] hospital lab), Hosp Order     Status: None   Collection Time: 03/29/19  7:34 PM   Specimen: Nasopharyngeal Swab  Result Value Ref Range Status   SARS Coronavirus 2 NEGATIVE NEGATIVE Final    Comment: (NOTE) If result is NEGATIVE SARS-CoV-2 target nucleic acids are NOT DETECTED. The SARS-CoV-2 RNA is generally detectable in upper and lower  respiratory specimens during the acute phase of infection. The lowest  concentration of SARS-CoV-2 viral copies this assay can detect is 250  copies / mL. A negative result does not preclude SARS-CoV-2 infection  and should not be used as the sole basis for treatment or other  patient management decisions.  A negative result may occur with  improper specimen collection / handling, submission of specimen other  than nasopharyngeal swab, presence of viral mutation(s) within the  areas targeted by this assay, and inadequate number of viral copies  (<250 copies / mL). A negative result must be combined with clinical  observations, patient  history, and epidemiological information. If result is POSITIVE SARS-CoV-2 target nucleic acids are DETECTED. The SARS-CoV-2 RNA is generally detectable in upper and lower  respiratory specimens dur ing the acute phase of infection.  Positive  results are indicative of active infection with SARS-CoV-2.  Clinical  correlation with patient history and other diagnostic information is  necessary to determine patient infection status.  Positive results do  not rule out bacterial infection or co-infection with other viruses. If result is PRESUMPTIVE POSTIVE SARS-CoV-2 nucleic acids MAY BE PRESENT.   A presumptive positive result was obtained on the submitted specimen  and confirmed on repeat testing.  While 2019 novel coronavirus  (SARS-CoV-2) nucleic acids may be present in the  submitted sample  additional confirmatory testing may be necessary for epidemiological  and / or clinical management purposes  to differentiate between  SARS-CoV-2 and other Sarbecovirus currently known to infect humans.  If clinically indicated additional testing with an alternate test  methodology [phone]) is advised. The SARS-CoV-2 RNA is generally  detectable in upper and lower respiratory sp ecimens during the acute  phase of infection. The expected result is Negative. Fact Sheet for Patients:  StrictlyIdeas.no Fact Sheet for Healthcare Providers: BankingDealers.co.za This test is not yet approved or cleared by the [place] FDA and has been authorized for detection and/or diagnosis of SARS-CoV-2 by FDA under an Emergency Use Authorization (EUA).  This EUA will remain in effect (meaning this test can be used) for the duration of the COVID-19 declaration under Section 564(b)(1) of the Act, 21 U.S.C. section 360bbb-3(b)(1), unless the authorization is terminated or revoked sooner. Performed at [facility], [place] [address], [place], [place] [address]    Blood culture (routine x 2)     Status: None (Preliminary result)   Collection Time: 03/29/19  7:50 PM   Specimen: BLOOD  Result Value Ref Range Status   Specimen Description BLOOD LEFT ANTECUBITAL  Final   Special Requests   Final    BOTTLES DRAWN AEROBIC AND ANAEROBIC Blood Culture adequate volume   Culture   Final    NO GROWTH 3 DAYS Performed at [facility], [place] [address], [place], [place] [address]    Report Status PENDING  Incomplete     Labs: Basic Metabolic Panel: Recent Labs  Lab 03/29/19 1934 03/30/19 0343 04/01/19 0202  NA 134* 137 132*  K 4.2 3.6 3.6  CL 103 104 100  CO2 19* 21* 22  GLUCOSE 105* 90 145*  BUN 34* 30* 41*  CREATININE 1.67* 1.46* 1.95*  CALCIUM 8.9 8.8* 8.5*   Liver Function Tests: No results for input(s): AST, ALT, ALKPHOS, BILITOT, PROT, ALBUMIN in the last 168 hours. No results for input(s): LIPASE, AMYLASE in the last 168 hours. No results for input(s): AMMONIA in the last 168 hours. CBC: Recent Labs  Lab 03/29/19 1934 03/30/19 0343  WBC 5.2 4.5  NEUTROABS 2.6  --   HGB 12.2* 12.2*  HCT 37.8* 37.7*  MCV 91.7 92.6  PLT 228 216   Cardiac Enzymes: No results for input(s): CKTOTAL, CKMB, CKMBINDEX, TROPONINI in the last 168 hours. BNP: BNP (last 3 results) No results for input(s): BNP in the last 8760 hours.  ProBNP (last 3 results) No results for input(s): PROBNP in the last 8760 hours.  CBG: Recent Labs  Lab 03/31/19 1208 03/31/19 1713 03/31/19 2014 04/01/19 0743 04/01/19 1212  GLUCAP 121* 128* 101* 105* 110*       Signed:  Radene Gunning NP [facility] 04/01/2019, 1:26 PM

## 2019-04-01 NOTE — Progress Notes (Signed)
Talked to daughter, [name], and instructed her how to do the dressing changes on the lower extremities.  Supplies sent home with pt and dtr informed on how to use them.  DC instructions sent home with pt and dtr instructed.  Pt stated that he already had a BSC at home so he did not need that.

## 2019-04-01 NOTE — TOC Transition Note (Signed)
Transition of Care [facility]) - CM/SW Discharge Note   Patient Details  Name: [name] Elliott MRN: [id number] Date of Birth: 06-30-1928  [facility]) CM/SW Contact:  Alexander [place], [place] Phone Number: 04/01/2019, 3:06 PM   Clinical Narrative:    [facility] able to accept referral; pt daughter [name] updated- she knows RN will likely not be able to schedule a visit until the weekend. Family understands they are responsible for the 24/7 care as they are declining SNF. PT/OT/RN/Social Work ordered.   Pt RN aware pt daughter will come and pick up pt and will provide discharge education at that point. Pt was responsible for portion of 3 in 1 cost, pt and pt daughter per [place] did not accept.    Final next level of care: [place] Barriers to Discharge: Barriers Resolved   Patient Goals and CMS Choice Patient states their goals for this hospitalization and ongoing recovery are:: to go home CMS Medicare.gov Compare Post Acute Care list provided to:: Patient Represenative (must comment)(pt daughter [name]) Choice offered to / list presented to : Adult Children  Discharge Placement Name of family member notified: pt daughter [name] Patient and family notified of of transfer: 04/01/19  Discharge Plan and Services In-house Referral: Clinical Social Work Discharge Planning Services: CM Consult Post Acute Care Choice: Home Health          DME Arranged: 3-N-1 DME Agency: [employer] Date DME Agency Contacted: 04/01/19 Time DME Agency Contacted: 66 Representative spoke with at DME Agency: [place]: RN, PT, OT, Social Work [employer] Agency: [place] Date [place]: 04/01/19 Time [place]: 76 Representative spoke with at [place]: [place] ([place]) Interventions     Readmission Risk Interventions No flowsheet data found.

## 2019-04-01 NOTE — Progress Notes (Signed)
Pharmacy Antibiotic Note  [name] Elliott is a 85 y.o. male admitted on 03/29/2019 with cellulitis.  Pharmacy has been consulted for vancomycin and zosyn dosing. Pt is afebrile and WBC is WNL. SCr is elevated at 1.95. Pt was on doxycycline and amoxicillin PTA.   Plan: Continue Vancomycin 750mg  IV Q48H Continue Zosyn 3.375gm IV Q8H (4 hr inf) F/u renal fxn, C&S, clinical status and peak/trough at [facility]  Estimated AUC 516.3 Scr used: 1.95  Height: 4\' 11"  (149.9 cm) Weight: 167 lb 15.9 oz (76.2 kg) IBW/kg (Calculated) : 47.7  Temp (24hrs), Avg:97.9 F (36.6 C), Min:97.8 F (36.6 C), Max:98 F (36.7 C)  Recent Labs  Lab 03/25/19 1230 03/29/19 1934 03/30/19 0343 04/01/19 0202  WBC 8.1 5.2 4.5  --   CREATININE 1.72* 1.67* 1.46* 1.95*    Estimated Creatinine Clearance: 21.9 mL/min (A) (by C-G formula based on SCr of 1.95 mg/dL (H)).    Allergies  Allergen Reactions  . Crestor [Rosuvastatin Calcium] Other (See Comments)    Muscle pain/aches  . Mestinon [Pyridostigmine Bromide Er] Other (See Comments)    GI upset, stomach cramps    Antimicrobials this admission: Vanc 7/26>> Zosyn 7/26>>  Dose adjustments this admission: N/A  Microbiology results: 7/26 BCx: ng3d 7/26 COVID: neg  Richardine Service, PharmD PGY1 Pharmacy Resident Phone: [phone] 04/01/2019  9:52 AM  Please check [email or web address] for unit-specific pharmacy phone numbers.

## 2019-04-01 NOTE — TOC Progression Note (Signed)
Transition of Care [facility]) - Progression Note    Patient Details  Name: [name] Elliott MRN: [id number] Date of Birth: 1928/06/03  Transition of Care [facility]) CM/SW [place], [place] Phone Number: 04/01/2019, 9:45 AM  Clinical Narrative:    CSW able to reach pt daughter [name] this morning. She lives 15 minutes down the road from patient; CSW confirmed address on facesheet. She understands recommendations from PT/OT and also understands that pt is refusing SNF and she does not want to force him to go. We talked about 24/7 assistance and she recognizes that unless families private pay that they usually have family members that provide that care. Pt has had home health before but she cannot remember the company and provided her email address for a list to be sent to.   They are requesting a 3 in 1 bedside commode, pt has all other DME at home but often refuses to use it.   CSW will send CMS list of [place] agencies to [email or web address]   Expected Discharge Plan: [place] Barriers to Discharge: Ship broker, Continued Medical Work up  Expected Discharge Plan and Services Expected Discharge Plan: [place] In-house Referral: Clinical Social Work Discharge Planning Services: CM Consult Post Acute Care Choice: [place] arrangements for the past 2 months: Single Family Home   Social Determinants of Health (SDOH) Interventions    Readmission Risk Interventions No flowsheet data found.

## 2019-04-01 NOTE — TOC Progression Note (Signed)
Transition of Care [facility]) - Progression Note    Patient Details  Name: [name] Elliott MRN: [id number] Date of Birth: March 11, 1928  Transition of Care [facility]) CM/SW [place], [place] Phone Number: 04/01/2019, 1:54 PM  Clinical Narrative:    CSW spoke with pt daughter- alerted her that pt is stable for discharge today. She continues to work with family to find 24 hr supervision but has chosen the following for referrals for HH: [facility]- message left for liasion [facility]- can review but no RN until at least the weekend [facility]- reviewing referral [facility]- will f/u if none of the above can accept.  3 in 1 has been ordered and requested from [place].   Expected Discharge Plan: [place] Barriers to Discharge: Ship broker, Continued Medical Work up  Expected Discharge Plan and Services Expected Discharge Plan: [place] In-house Referral: Clinical Social Work Discharge Planning Services: CM Consult Post Acute Care Choice: [place] arrangements for the past 2 months: Single Family Home Expected Discharge Date: 04/01/19                  Social Determinants of Health (SDOH) Interventions    Readmission Risk Interventions No flowsheet data found.

## 2019-04-03 LAB — CULTURE, BLOOD (ROUTINE X 2)
Culture: NO GROWTH
Culture: NO GROWTH
Special Requests: ADEQUATE
Special Requests: ADEQUATE

## 2019-04-13 ENCOUNTER — Ambulatory Visit (INDEPENDENT_AMBULATORY_CARE_PROVIDER_SITE_OTHER): Payer: PPO | Admitting: Family Medicine

## 2019-04-13 ENCOUNTER — Encounter: Payer: Self-pay | Admitting: Family Medicine

## 2019-04-13 ENCOUNTER — Other Ambulatory Visit: Payer: Self-pay

## 2019-04-13 VITALS — BP 118/76 | HR 68 | Temp 97.9°F | Ht 59.0 in | Wt 167.0 lb

## 2019-04-13 DIAGNOSIS — E039 Hypothyroidism, unspecified: Secondary | ICD-10-CM

## 2019-04-13 DIAGNOSIS — M79661 Pain in right lower leg: Secondary | ICD-10-CM | POA: Diagnosis not present

## 2019-04-13 DIAGNOSIS — E538 Deficiency of other specified B group vitamins: Secondary | ICD-10-CM | POA: Diagnosis not present

## 2019-04-13 DIAGNOSIS — M7989 Other specified soft tissue disorders: Secondary | ICD-10-CM | POA: Diagnosis not present

## 2019-04-13 NOTE — Progress Notes (Signed)
8/10/20205:00 PM  [name] Elliott 22-Dec-1927, 85 y.o., male [id number]  Chief Complaint  Patient presents with  . Follow-up    cellulitis follow up in the right leg, pt says he is not having in pain but does deal with swelling    HPI:   Patient is a 85 y.o. male with past medical history significant for hypothyroidism, CAD, HTN, CHF, afib, DM2, osteoporosis, CKD3, dementiawho presents today for hosp followup  Admitted from 7/26-29 for RLE cellulitis tx IV with vanc and zosyn, transitioned to keflex and doxy SNF recommended, pt refused, HH ordered  Has been trying to elevate his legs which is not helping Right leg calf remains swollen and painful Redness is a bit better  Completed antibiotics  Used to be on blood thinners for his afib Had 3 incidents of bleeding requiring ER visits Cards decided to stop medication  Received b12 injection in the hospital  [facility] will be coming out tomorrow  His granddaughter has been staying with him  Lab Results  Component Value Date   TSH 6.010 (H) 03/30/2019   Lab Results  Component Value Date   VITAMINB12 218 (L) 03/09/2019    Lab Results  Component Value Date   HGBA1C 7.3 (H) 03/30/2019   Lab Results  Component Value Date   CREATININE 1.95 (H) 04/01/2019   BUN 41 (H) 04/01/2019   NA 132 (L) 04/01/2019   K 3.6 04/01/2019   CL 100 04/01/2019   CO2 22 04/01/2019   Lab Results  Component Value Date   WBC 4.5 03/30/2019   HGB 12.2 (L) 03/30/2019   HCT 37.7 (L) 03/30/2019   MCV 92.6 03/30/2019   PLT 216 03/30/2019    Depression screen PHQ 2/9 04/13/2019 01/21/2019 12/11/2018  Decreased Interest 0 0 0  Down, Depressed, Hopeless 0 0 0  PHQ - 2 Score 0 0 0  Some recent data might be hidden    Fall Risk  04/13/2019 01/21/2019 12/11/2018 09/04/2018 08/04/2018  Falls in the past year? 0 1 1 1 1   Number falls in past yr: 0 0 0 0 0  Injury with Fall? 0 1 1 0 1  Comment - - pt states due to his dog - -     Allergies  Allergen  Reactions  . Crestor [Rosuvastatin Calcium] Other (See Comments)    Muscle pain/aches  . Mestinon [Pyridostigmine Bromide Er] Other (See Comments)    GI upset, stomach cramps    Prior to Admission medications   Medication Sig Start Date End Date Taking? Authorizing Provider  aspirin EC 81 MG tablet Take 81 mg by mouth daily.    Yes [provider]  bismuth subsalicylate (PEPTO BISMOL) 262 MG/15ML suspension Take 30 mLs by mouth daily as needed for indigestion or diarrhea or loose stools.    Yes [provider]  diclofenac sodium (VOLTAREN) 1 % GEL Apply 2 g topically 4 (four) times daily. Patient taking differently: Apply 2 g topically 4 (four) times daily as needed (pain).  08/04/18  Yes Rutherford Guys, MD  docusate sodium (COLACE) 100 MG capsule Take 1 capsule (100 mg total) by mouth every 12 (twelve) hours. 07/29/18  Yes Harris, Abigail, PA-C  furosemide (LASIX) 40 MG tablet Take 1 tablet (40 mg total) by mouth daily. 07/03/18  Yes Nahser, Wonda Cheng, MD  gabapentin (NEURONTIN) 300 MG capsule TAKE 2 CAPSULES BY MOUTH THREE TIMES DAILY Patient taking differently: Take 600 mg by mouth 3 (three) times daily.  02/16/19  Yes Rutherford Guys, MD  isosorbide mononitrate (IMDUR) 30 MG 24 hr tablet Take 1 tablet (30 mg total) by mouth daily. 10/02/18  Yes Nahser, Wonda Cheng, MD  levothyroxine (SYNTHROID) 50 MCG tablet Take 1 tablet (50 mcg total) by mouth every other day. 30 minutes before breakfast. Alternated with 40mcg tablet. 03/10/19  Yes Rutherford Guys, MD  levothyroxine (SYNTHROID) 75 MCG tablet Take 1 tablet (75 mcg total) by mouth every other day. 30 minutes before breakfast. Alternate with 103mcg tablet. 03/10/19  Yes Rutherford Guys, MD  lidocaine (LIDODERM) 5 % Place 1 patch onto the skin daily. Remove & Discard patch within 12 hours or as directed by MD 09/04/18  Yes Rutherford Guys, MD  magnesium hydroxide (MILK OF MAGNESIA) 400 MG/5ML suspension Take 15 mLs by mouth daily as  needed for mild constipation or moderate constipation.   Yes [provider]  nitroGLYCERIN (NITROSTAT) 0.4 MG SL tablet Place 1 tablet (0.4 mg total) under the tongue every 5 (five) minutes as needed for chest pain. 07/03/18  Yes Nahser, Wonda Cheng, MD  omeprazole (PRILOSEC) 20 MG capsule Take 1 capsule (20 mg total) by mouth 2 (two) times daily before a meal. 07/01/18  Yes McVey, Gelene Mink, PA-C  potassium chloride SA (K-DUR) 20 MEQ tablet Take 1 tablet (20 mEq total) by mouth 2 (two) times daily. 03/25/19  Yes Corum, Rex Kras, MD    Past Medical History:  Diagnosis Date  . Arthritis   . Arthropathy, unspecified, site unspecified   . Atrial fibrillation ([place])   . Blood dyscrasia    thromboctopenia pt family states he was never told of this  . CAD (coronary artery disease)    last cath in 2012. Managed medically-some blockages  . Chronic lower back pain   . Chronic systolic CHF (congestive heart failure) ([place]) 06/04/2016  . Constipation, chronic   . Diabetes mellitus without complication ([place])    pt states he doesn't have  . DVT (deep venous thrombosis) ([place])   . Dysrhythmia   . Esophageal reflux   . 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 MRI Nov '11 - tendonosis and partial tear. Left shoulder surgery Jan '12 for partial shoulder replacement. [name])   . Headache(784.0)   . History of stomach ulcers 11/2012  . Hypothyroidism    pt states he doeen't have   . Orthostasis   . Other B-complex deficiencies   . Other malaise and fatigue   . Pain in joint, shoulder region    Has chronic shoulder pain  . Pain in limb   . Persistent disorder of initiating or maintaining sleep   . Pneumonia 1980's?; 2011  . Primary localized osteoarthritis of left knee 07/12/2015  . Pure hypercholesterolemia   . PVC's (premature ventricular contractions)   . S/P cardiac cath 08/08/11   mild to moderate CAD primarily in the LAD. None are obstructive and appear  stable from prior cath in 2007; managed medically  . Sebaceous cyst   . Stroke ([place])   . Thrombocytopenia, unspecified ([place])   . Unspecified essential hypertension   . Vascular dementia without behavioral disturbance ([facility])    with periods of amnesia    Past Surgical History:  Procedure Laterality Date  . BACK SURGERY    . CARDIAC CATHETERIZATION  08/2011  . CATARACT EXTRACTION Left 2009  . COLONOSCOPY    . ESOPHAGOGASTRODUODENOSCOPY N/A 11/19/2012   Procedure: ESOPHAGOGASTRODUODENOSCOPY (EGD);  Surgeon: Jerene Bears, MD;  Location: [facility]  ENDOSCOPY;  Service: Gastroenterology;  Laterality: N/A;  . FINGER AMPUTATION Right    pinky finger  . HAND SURGERY Right    crush injury, right fifth digit contracture, limited  motion  . HARDWARE REMOVAL  02/05/2012   Procedure: HARDWARE REMOVAL;  Surgeon: Johnny Bridge, MD;  Location: [place];  Service: Orthopedics;  Laterality: Left;  . KNEE SURGERY Left 1991   "did it twice in 1 wk" (02/17/2013)  . LUMBAR SPINE SURGERY  2007   Dr Philip Aspen ([place])  . REVERSE SHOULDER ARTHROPLASTY  02/05/2012   Procedure: REVERSE SHOULDER ARTHROPLASTY;  Surgeon: Johnny Bridge, MD;  Location: [place];  Service: Orthopedics;  Laterality: Left;  . SHOULDER HEMI-ARTHROPLASTY    . SHOULDER OPEN ROTATOR CUFF REPAIR Left 8.30.2011  . TOTAL KNEE ARTHROPLASTY Left 07/12/2015   Procedure: TOTAL KNEE ARTHROPLASTY;  Surgeon: Marchia Bond, MD;  Location: [place];  Service: Orthopedics;  Laterality: Left;  . US ECHOCARDIOGRAPHY  02-28-2010   Est EF 50-55%    Social History   Tobacco Use  . Smoking status: Former Smoker    Types: Cigars    Quit date: 09/03/1978    Years since quitting: 40.6  . Smokeless tobacco: Current User    Types: Chew  . Tobacco comment: 02/17/2013 "I aien't smoked a cigar in 20-30 yr"  Substance Use Topics  . Alcohol use: No    Alcohol/week: 0.0 standard drinks    Family History  Problem Relation Age of Onset  . Breast cancer Mother   .  Cancer Mother   . Diabetes Mother   . Cancer Brother        throat  . Stroke Brother   . Kidney disease Father   . Diabetes Sister   . Diabetes Daughter   . Colon cancer Neg Hx   . Heart attack Neg Hx     Review of Systems  Constitutional: Negative for chills and fever.  Respiratory: Negative for cough and shortness of breath.   Cardiovascular: Positive for leg swelling. Negative for chest pain and palpitations.  Gastrointestinal: Negative for abdominal pain, nausea and vomiting.     OBJECTIVE:  Today's Vitals   04/13/19 1636  BP: 118/76  Pulse: 68  Temp: 97.9 F (36.6 C)  TempSrc: Oral  SpO2: 96%  Weight: 167 lb (75.8 kg)  Height: 4\' 11"  (1.499 m)   Body mass index is 33.73 kg/m.   Physical Exam Vitals signs and nursing note reviewed.  Constitutional:      Appearance: He is well-developed.  HENT:     Head: Normocephalic and atraumatic.  Eyes:     Conjunctiva/sclera: Conjunctivae normal.     Pupils: Pupils are equal, round, and reactive to light.  Neck:     Musculoskeletal: Neck supple.  Cardiovascular:     Rate and Rhythm: Normal rate and regular rhythm.     Heart sounds: No murmur. No friction rub. No gallop.   Pulmonary:     Effort: Pulmonary effort is normal.     Breath sounds: Normal breath sounds. No wheezing or rales.  Musculoskeletal:     Right lower leg: Edema (calf swollen, greater than left, tender, warm, no erythema or palpable cords, distally no ede,ma, chronic stasis changes with unstaged ulcer) present.  Skin:    General: Skin is warm and dry.  Neurological:     Mental Status: He is alert and oriented to person, place, and time.     ASSESSMENT and PLAN  1. Pain and swelling  of right lower leg Cellulitis resolved. Concern for DVT. Patient with h/o abnormal bleeding on DOAC, if + DVT green filter??? Consider referral to vasc surg given presence of ulcer. - VAS [place] LOWER EXTREMITY VENOUS (DVT); Future  2. Hypothyroidism, unspecified type  Checking labs, medications will be adjusted as needed.  - TSH; Future  3. Vitamin B12 deficiency Checking labs, medications will be adjusted as needed.  - Vitamin B12; Future  Return in about 4 weeks (around 05/11/2019).    Rutherford Guys, MD Primary Care at [place], [place] [address] Ph.  [phone] Fax [phone]

## 2019-04-13 NOTE — Patient Instructions (Addendum)
    Labs in 2 weeks  If you have lab work done today you will be contacted with your lab results within the next 2 weeks.  If you have not heard from [place] then please contact us. The fastest way to get your results is to register for My Chart.   IF you received an x-ray today, you will receive an invoice from [facility]. Please contact [facility] at [phone] with questions or concerns regarding your invoice.   IF you received labwork today, you will receive an invoice from [facility]. Please contact [facility] at [phone] with questions or concerns regarding your invoice.   Our billing staff will not be able to assist you with questions regarding bills from these companies.  You will be contacted with the lab results as soon as they are available. The fastest way to get your results is to activate your My Chart account. Instructions are located on the last page of this paperwork. If you have not heard from [place] regarding the results in 2 weeks, please contact this office.

## 2019-04-14 ENCOUNTER — Telehealth: Payer: Self-pay | Admitting: *Deleted

## 2019-04-14 ENCOUNTER — Other Ambulatory Visit: Payer: Self-pay | Admitting: *Deleted

## 2019-04-14 ENCOUNTER — Ambulatory Visit (HOSPITAL_COMMUNITY)
Admission: RE | Admit: 2019-04-14 | Discharge: 2019-04-14 | Disposition: A | Discharge status: Home / Self Care | Payer: PPO | Source: Ambulatory Visit | Attending: Family Medicine | Admitting: Family Medicine

## 2019-04-14 DIAGNOSIS — M79661 Pain in right lower leg: Secondary | ICD-10-CM | POA: Diagnosis not present

## 2019-04-14 DIAGNOSIS — M7989 Other specified soft tissue disorders: Secondary | ICD-10-CM

## 2019-04-14 NOTE — Progress Notes (Signed)
Lower extremity venous has been completed.   Preliminary results in CV Proc.  Attempted to call results to [place], MD but no answer.   Abram Sander 04/14/2019 1:20 PM

## 2019-04-14 NOTE — Telephone Encounter (Signed)
Spoke with patients daughter Yancarlos has an appointment at [facility] go to Admitting for an ultrasound.   Voiced understanding

## 2019-04-15 DIAGNOSIS — Z7982 Long term (current) use of aspirin: Secondary | ICD-10-CM | POA: Diagnosis not present

## 2019-04-15 DIAGNOSIS — Z86718 Personal history of other venous thrombosis and embolism: Secondary | ICD-10-CM | POA: Diagnosis not present

## 2019-04-15 DIAGNOSIS — E1142 Type 2 diabetes mellitus with diabetic polyneuropathy: Secondary | ICD-10-CM | POA: Diagnosis not present

## 2019-04-15 DIAGNOSIS — I2510 Atherosclerotic heart disease of native coronary artery without angina pectoris: Secondary | ICD-10-CM | POA: Diagnosis not present

## 2019-04-15 DIAGNOSIS — M810 Age-related osteoporosis without current pathological fracture: Secondary | ICD-10-CM | POA: Diagnosis not present

## 2019-04-15 DIAGNOSIS — F0150 Vascular dementia without behavioral disturbance: Secondary | ICD-10-CM | POA: Diagnosis not present

## 2019-04-15 DIAGNOSIS — L03115 Cellulitis of right lower limb: Secondary | ICD-10-CM | POA: Diagnosis not present

## 2019-04-15 DIAGNOSIS — E039 Hypothyroidism, unspecified: Secondary | ICD-10-CM | POA: Diagnosis not present

## 2019-04-15 DIAGNOSIS — G4700 Insomnia, unspecified: Secondary | ICD-10-CM | POA: Diagnosis not present

## 2019-04-15 DIAGNOSIS — Z8673 Personal history of transient ischemic attack (TIA), and cerebral infarction without residual deficits: Secondary | ICD-10-CM | POA: Diagnosis not present

## 2019-04-15 DIAGNOSIS — Z89021 Acquired absence of right finger(s): Secondary | ICD-10-CM | POA: Diagnosis not present

## 2019-04-15 DIAGNOSIS — I5042 Chronic combined systolic (congestive) and diastolic (congestive) heart failure: Secondary | ICD-10-CM | POA: Diagnosis not present

## 2019-04-15 DIAGNOSIS — E1122 Type 2 diabetes mellitus with diabetic chronic kidney disease: Secondary | ICD-10-CM | POA: Diagnosis not present

## 2019-04-15 DIAGNOSIS — K219 Gastro-esophageal reflux disease without esophagitis: Secondary | ICD-10-CM | POA: Diagnosis not present

## 2019-04-15 DIAGNOSIS — I130 Hypertensive heart and chronic kidney disease with heart failure and stage 1 through stage 4 chronic kidney disease, or unspecified chronic kidney disease: Secondary | ICD-10-CM | POA: Diagnosis not present

## 2019-04-15 DIAGNOSIS — M1712 Unilateral primary osteoarthritis, left knee: Secondary | ICD-10-CM | POA: Diagnosis not present

## 2019-04-15 DIAGNOSIS — G8929 Other chronic pain: Secondary | ICD-10-CM | POA: Diagnosis not present

## 2019-04-15 DIAGNOSIS — Z96652 Presence of left artificial knee joint: Secondary | ICD-10-CM | POA: Diagnosis not present

## 2019-04-15 DIAGNOSIS — Z8701 Personal history of pneumonia (recurrent): Secondary | ICD-10-CM | POA: Diagnosis not present

## 2019-04-15 DIAGNOSIS — Z96612 Presence of left artificial shoulder joint: Secondary | ICD-10-CM | POA: Diagnosis not present

## 2019-04-15 DIAGNOSIS — E538 Deficiency of other specified B group vitamins: Secondary | ICD-10-CM | POA: Diagnosis not present

## 2019-04-15 DIAGNOSIS — E7800 Pure hypercholesterolemia, unspecified: Secondary | ICD-10-CM | POA: Diagnosis not present

## 2019-04-15 DIAGNOSIS — M545 Low back pain: Secondary | ICD-10-CM | POA: Diagnosis not present

## 2019-04-15 DIAGNOSIS — N183 Chronic kidney disease, stage 3 (moderate): Secondary | ICD-10-CM | POA: Diagnosis not present

## 2019-04-15 DIAGNOSIS — I480 Paroxysmal atrial fibrillation: Secondary | ICD-10-CM | POA: Diagnosis not present

## 2019-04-19 ENCOUNTER — Other Ambulatory Visit: Payer: Self-pay | Admitting: Family Medicine

## 2019-04-19 DIAGNOSIS — G609 Hereditary and idiopathic neuropathy, unspecified: Secondary | ICD-10-CM

## 2019-04-20 DIAGNOSIS — Z96612 Presence of left artificial shoulder joint: Secondary | ICD-10-CM | POA: Diagnosis not present

## 2019-04-20 DIAGNOSIS — Z8673 Personal history of transient ischemic attack (TIA), and cerebral infarction without residual deficits: Secondary | ICD-10-CM | POA: Diagnosis not present

## 2019-04-20 DIAGNOSIS — K219 Gastro-esophageal reflux disease without esophagitis: Secondary | ICD-10-CM | POA: Diagnosis not present

## 2019-04-20 DIAGNOSIS — I480 Paroxysmal atrial fibrillation: Secondary | ICD-10-CM | POA: Diagnosis not present

## 2019-04-20 DIAGNOSIS — M1712 Unilateral primary osteoarthritis, left knee: Secondary | ICD-10-CM | POA: Diagnosis not present

## 2019-04-20 DIAGNOSIS — I2510 Atherosclerotic heart disease of native coronary artery without angina pectoris: Secondary | ICD-10-CM | POA: Diagnosis not present

## 2019-04-20 DIAGNOSIS — Z86718 Personal history of other venous thrombosis and embolism: Secondary | ICD-10-CM | POA: Diagnosis not present

## 2019-04-20 DIAGNOSIS — E039 Hypothyroidism, unspecified: Secondary | ICD-10-CM | POA: Diagnosis not present

## 2019-04-20 DIAGNOSIS — F0150 Vascular dementia without behavioral disturbance: Secondary | ICD-10-CM | POA: Diagnosis not present

## 2019-04-20 DIAGNOSIS — E1122 Type 2 diabetes mellitus with diabetic chronic kidney disease: Secondary | ICD-10-CM | POA: Diagnosis not present

## 2019-04-20 DIAGNOSIS — E7800 Pure hypercholesterolemia, unspecified: Secondary | ICD-10-CM | POA: Diagnosis not present

## 2019-04-20 DIAGNOSIS — G8929 Other chronic pain: Secondary | ICD-10-CM | POA: Diagnosis not present

## 2019-04-20 DIAGNOSIS — L03115 Cellulitis of right lower limb: Secondary | ICD-10-CM | POA: Diagnosis not present

## 2019-04-20 DIAGNOSIS — E1142 Type 2 diabetes mellitus with diabetic polyneuropathy: Secondary | ICD-10-CM | POA: Diagnosis not present

## 2019-04-20 DIAGNOSIS — I5042 Chronic combined systolic (congestive) and diastolic (congestive) heart failure: Secondary | ICD-10-CM | POA: Diagnosis not present

## 2019-04-20 DIAGNOSIS — Z7982 Long term (current) use of aspirin: Secondary | ICD-10-CM | POA: Diagnosis not present

## 2019-04-20 DIAGNOSIS — G4700 Insomnia, unspecified: Secondary | ICD-10-CM | POA: Diagnosis not present

## 2019-04-20 DIAGNOSIS — N183 Chronic kidney disease, stage 3 (moderate): Secondary | ICD-10-CM | POA: Diagnosis not present

## 2019-04-20 DIAGNOSIS — Z8701 Personal history of pneumonia (recurrent): Secondary | ICD-10-CM | POA: Diagnosis not present

## 2019-04-20 DIAGNOSIS — Z96652 Presence of left artificial knee joint: Secondary | ICD-10-CM | POA: Diagnosis not present

## 2019-04-20 DIAGNOSIS — I130 Hypertensive heart and chronic kidney disease with heart failure and stage 1 through stage 4 chronic kidney disease, or unspecified chronic kidney disease: Secondary | ICD-10-CM | POA: Diagnosis not present

## 2019-04-20 DIAGNOSIS — M810 Age-related osteoporosis without current pathological fracture: Secondary | ICD-10-CM | POA: Diagnosis not present

## 2019-04-20 DIAGNOSIS — M545 Low back pain: Secondary | ICD-10-CM | POA: Diagnosis not present

## 2019-04-20 DIAGNOSIS — E538 Deficiency of other specified B group vitamins: Secondary | ICD-10-CM | POA: Diagnosis not present

## 2019-04-20 DIAGNOSIS — Z89021 Acquired absence of right finger(s): Secondary | ICD-10-CM | POA: Diagnosis not present

## 2019-04-30 ENCOUNTER — Telehealth: Payer: Self-pay | Admitting: Family Medicine

## 2019-04-30 ENCOUNTER — Other Ambulatory Visit: Payer: Self-pay

## 2019-04-30 NOTE — Telephone Encounter (Signed)
ORDERS FAXED.

## 2019-05-14 ENCOUNTER — Encounter: Payer: Self-pay | Admitting: Family Medicine

## 2019-05-14 ENCOUNTER — Other Ambulatory Visit: Payer: Self-pay

## 2019-05-14 ENCOUNTER — Ambulatory Visit (INDEPENDENT_AMBULATORY_CARE_PROVIDER_SITE_OTHER): Payer: PPO | Admitting: Family Medicine

## 2019-05-14 VITALS — BP 121/65 | HR 72 | Temp 98.6°F | Ht 59.0 in | Wt 164.0 lb

## 2019-05-14 DIAGNOSIS — R0989 Other specified symptoms and signs involving the circulatory and respiratory systems: Secondary | ICD-10-CM

## 2019-05-14 DIAGNOSIS — E538 Deficiency of other specified B group vitamins: Secondary | ICD-10-CM

## 2019-05-14 DIAGNOSIS — Z23 Encounter for immunization: Secondary | ICD-10-CM | POA: Diagnosis not present

## 2019-05-14 DIAGNOSIS — M79661 Pain in right lower leg: Secondary | ICD-10-CM | POA: Diagnosis not present

## 2019-05-14 DIAGNOSIS — M7989 Other specified soft tissue disorders: Secondary | ICD-10-CM

## 2019-05-14 DIAGNOSIS — E039 Hypothyroidism, unspecified: Secondary | ICD-10-CM | POA: Diagnosis not present

## 2019-05-14 MED ORDER — CYANOCOBALAMIN 1000 MCG/ML IJ SOLN
1000.0000 ug | INTRAMUSCULAR | Status: AC
  Administered 2019-05-14: 1000 ug via INTRAMUSCULAR

## 2019-05-14 NOTE — Patient Instructions (Signed)
     If you have lab work done today you will be contacted with your lab results within the next 2 weeks.  If you have not heard from us then please contact us. The fastest way to get your results is to register for My Chart.   IF you received an x-ray today, you will receive an invoice from [facility]. Please contact [facility] at [phone] with questions or concerns regarding your invoice.   IF you received labwork today, you will receive an invoice from [facility]. Please contact [facility] at [phone] with questions or concerns regarding your invoice.   Our billing staff will not be able to assist you with questions regarding bills from these companies.  You will be contacted with the lab results as soon as they are available. The fastest way to get your results is to activate your My Chart account. Instructions are located on the last page of this paperwork. If you have not heard from us regarding the results in 2 weeks, please contact this office.        If you have lab work done today you will be contacted with your lab results within the next 2 weeks.  If you have not heard from us then please contact us. The fastest way to get your results is to register for My Chart.   IF you received an x-ray today, you will receive an invoice from [facility]. Please contact [facility] at [phone] with questions or concerns regarding your invoice.   IF you received labwork today, you will receive an invoice from [facility]. Please contact [facility] at [phone] with questions or concerns regarding your invoice.   Our billing staff will not be able to assist you with questions regarding bills from these companies.  You will be contacted with the lab results as soon as they are available. The fastest way to get your results is to activate your My Chart account. Instructions are located on the last page of this paperwork. If you have not heard from us  regarding the results in 2 weeks, please contact this office.     

## 2019-05-14 NOTE — Progress Notes (Signed)
9/10/20205:08 PM  [name] Elliott 11-08-1927, 85 y.o., male [id number]  Chief Complaint  Patient presents with   Follow-up    pt says he does not feel good at all and his legs hurt all the time    HPI:   Patient is a 85 y.o. male with past medical history significant for hypothyroidism, CAD, HTN, CHF, afib, DM2, osteoporosis, CKD3, dementia who presents today for followup  Last OV aug, - neg doppler RLE RLE unchanged still painful and swollen, states that he is not well, not himself, unable to elucidate more than that Due for b12 injection Taking new dose of levothyroxine  vasc surg in 2018 - known varicose veins , unable to see if evaluated for PAD, his daughter would like to be evaluated  Lab Results  Component Value Date   TSH 6.010 (H) 03/30/2019   Lab Results  Component Value Date   VITAMINB12 218 (L) 03/09/2019    Depression screen [facility] 2/9 05/14/2019 04/13/2019 01/21/2019  Decreased Interest 0 0 0  Down, Depressed, Hopeless 0 0 0  PHQ - 2 Score 0 0 0  Some recent data might be hidden    Fall Risk  05/14/2019 04/13/2019 01/21/2019 12/11/2018 09/04/2018  Falls in the past year? 0 0 1 1 1   Number falls in past yr: 0 0 0 0 0  Injury with Fall? 0 0 1 1 0  Comment - - - pt states due to his dog -     Allergies  Allergen Reactions   Crestor [Rosuvastatin Calcium] Other (See Comments)    Muscle pain/aches   Mestinon [Pyridostigmine Bromide Er] Other (See Comments)    GI upset, stomach cramps    Prior to Admission medications   Medication Sig Start Date End Date Taking? Authorizing Provider  aspirin EC 81 MG tablet Take 81 mg by mouth daily.    Yes [provider]  bismuth subsalicylate (PEPTO BISMOL) 262 MG/15ML suspension Take 30 mLs by mouth daily as needed for indigestion or diarrhea or loose stools.    Yes [provider]  diclofenac sodium (VOLTAREN) 1 % GEL Apply 2 g topically 4 (four) times daily. Patient taking differently: Apply 2 g  topically 4 (four) times daily as needed (pain).  08/04/18  Yes Rutherford Guys, MD  docusate sodium (COLACE) 100 MG capsule Take 1 capsule (100 mg total) by mouth every 12 (twelve) hours. 07/29/18  Yes Harris, Abigail, PA-C  furosemide (LASIX) 40 MG tablet Take 1 tablet (40 mg total) by mouth daily. 07/03/18  Yes Nahser, Wonda Cheng, MD  gabapentin (NEURONTIN) 300 MG capsule Take 2 capsules (600 mg total) by mouth 3 (three) times daily. 04/19/19  Yes Delia Chimes A, MD  isosorbide mononitrate (IMDUR) 30 MG 24 hr tablet Take 1 tablet (30 mg total) by mouth daily. 10/02/18  Yes Nahser, Wonda Cheng, MD  levothyroxine (SYNTHROID) 50 MCG tablet Take 1 tablet (50 mcg total) by mouth every other day. 30 minutes before breakfast. Alternated with 57mcg tablet. 03/10/19  Yes Rutherford Guys, MD  levothyroxine (SYNTHROID) 75 MCG tablet Take 1 tablet (75 mcg total) by mouth every other day. 30 minutes before breakfast. Alternate with 80mcg tablet. 03/10/19  Yes Rutherford Guys, MD  lidocaine (LIDODERM) 5 % Place 1 patch onto the skin daily. Remove & Discard patch within 12 hours or as directed by MD 09/04/18  Yes Rutherford Guys, MD  magnesium hydroxide (MILK OF MAGNESIA) 400 MG/5ML suspension Take 15 mLs by mouth  daily as needed for mild constipation or moderate constipation.   Yes [provider]  nitroGLYCERIN (NITROSTAT) 0.4 MG SL tablet Place 1 tablet (0.4 mg total) under the tongue every 5 (five) minutes as needed for chest pain. 07/03/18  Yes Nahser, Wonda Cheng, MD  omeprazole (PRILOSEC) 20 MG capsule Take 1 capsule (20 mg total) by mouth 2 (two) times daily before a meal. 07/01/18  Yes McVey, Gelene Mink, PA-C  potassium chloride SA (K-DUR) 20 MEQ tablet Take 1 tablet (20 mEq total) by mouth 2 (two) times daily. 03/25/19  Yes Corum, Rex Kras, MD    Past Medical History:  Diagnosis Date   Arthritis    Arthropathy, unspecified, site unspecified    Atrial fibrillation ([place])    Blood dyscrasia     thromboctopenia pt family states he was never told of this   CAD (coronary artery disease)    last cath in 2012. 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 MRI Nov '11 - tendonosis and partial tear. Left shoulder surgery Jan '12 for partial shoulder replacement. [name])    [id number])    History of stomach ulcers 11/2012   Hypothyroidism    pt states he doeen't have    Orthostasis    Other B-complex deficiencies    Other malaise and fatigue    Pain in joint, shoulder region    Has chronic shoulder pain   Pain in limb    Persistent disorder of initiating or maintaining sleep    Pneumonia 1980's?; 2011   Primary localized osteoarthritis of left knee 07/12/2015   Pure hypercholesterolemia    PVC's (premature ventricular contractions)    S/P cardiac cath 08/08/11   mild to moderate CAD primarily in the LAD. None are obstructive and appear stable from prior cath in 2007; managed medically   Sebaceous cyst    Stroke ([place])    Thrombocytopenia, unspecified ([place])    Unspecified essential hypertension    Vascular dementia without behavioral disturbance ([place])    with periods of amnesia    Past Surgical History:  Procedure Laterality Date   BACK SURGERY     CARDIAC CATHETERIZATION  08/2011   CATARACT EXTRACTION Left 2009   COLONOSCOPY     ESOPHAGOGASTRODUODENOSCOPY N/A 11/19/2012   Procedure: ESOPHAGOGASTRODUODENOSCOPY (EGD);  Surgeon: Jerene Bears, MD;  Location: [place];  Service: Gastroenterology;  Laterality: N/A;   FINGER AMPUTATION Right    pinky finger   HAND SURGERY Right    crush injury, right fifth digit  contracture, limited  motion   HARDWARE REMOVAL  02/05/2012   Procedure: HARDWARE REMOVAL;  Surgeon: Johnny Bridge, MD;  Location: [place];  Service: Orthopedics;  Laterality: Left;   KNEE SURGERY Left 1991   "did it twice in 1 wk" (02/17/2013)   LUMBAR SPINE SURGERY  2007   Dr Philip Aspen ([place])   REVERSE SHOULDER ARTHROPLASTY  02/05/2012   Procedure: REVERSE SHOULDER ARTHROPLASTY;  Surgeon: Johnny Bridge, MD;  Location: [place];  Service: Orthopedics;  Laterality: Left;   SHOULDER HEMI-ARTHROPLASTY     SHOULDER OPEN ROTATOR CUFF REPAIR Left 8.30.2011   TOTAL KNEE ARTHROPLASTY Left 07/12/2015  Procedure: TOTAL KNEE ARTHROPLASTY;  Surgeon: Marchia Bond, MD;  Location: [place];  Service: Orthopedics;  Laterality: Left;   US ECHOCARDIOGRAPHY  02-28-2010   Est EF 50-55%    Social History   Tobacco Use   Smoking status: Former Smoker    Types: Cigars    Quit date: 09/03/1978    Years since quitting: 40.7   Smokeless tobacco: Current User    Types: Chew   Tobacco comment: 02/17/2013 "I aien't smoked a cigar in 20-30 yr"  Substance Use Topics   Alcohol use: No    Alcohol/week: 0.0 standard drinks    Family History  Problem Relation Age of Onset   Breast cancer Mother    Cancer Mother    Diabetes Mother    Cancer Brother        throat   Stroke Brother    Kidney disease Father    Diabetes Sister    Diabetes Daughter    Colon cancer Neg Hx    Heart attack Neg Hx     Review of Systems  Constitutional: Negative for chills and fever.  Respiratory: Negative for cough and shortness of breath.   Cardiovascular: Negative for chest pain, palpitations and leg swelling.  Gastrointestinal: Negative for abdominal pain, nausea and vomiting.     OBJECTIVE:  Today's Vitals   05/14/19 1658  BP: 121/65  Pulse: 72  Temp: 98.6 F (37 C)  SpO2: 96%  Weight: 164 lb (74.4 kg)  Height: 4\' 11"  (1.499 m)   Body mass index is 33.12 kg/m.  Wt Readings from Last 3  Encounters:  05/14/19 164 lb (74.4 kg)  04/13/19 167 lb (75.8 kg)  03/29/19 167 lb 15.9 oz (76.2 kg)   Physical Exam Vitals signs and nursing note reviewed.  Constitutional:      Appearance: He is well-developed.  HENT:     Head: Normocephalic and atraumatic.  Eyes:     Conjunctiva/sclera: Conjunctivae normal.     Pupils: Pupils are equal, round, and reactive to light.  Neck:     Musculoskeletal: Neck supple.  Cardiovascular:     Rate and Rhythm: Normal rate and regular rhythm.     Pulses:          Dorsalis pedis pulses are 2+ on the right side.       Posterior tibial pulses are 0 on the right side.     Heart sounds: No murmur. No friction rub. No gallop.   Pulmonary:     Effort: Pulmonary effort is normal.     Breath sounds: Normal breath sounds. No wheezing or rales.  Musculoskeletal:     Right lower leg: Edema (chronic stasis changes, with swollen and tender calf) present.     Left lower leg: No edema.  Skin:    General: Skin is warm and dry.  Neurological:     Mental Status: He is alert and oriented to person, place, and time.     No results found for this or any previous visit (from the past 24 hour(s)).  No results found.   ASSESSMENT and PLAN  1. Decreased pedal pulses 2. Pain and swelling of right lower leg Referring as requested by family. Given known varicose veins and unable to tolerate compression stockings, discussed trial of OTC horse chestnut.  - Ambulatory referral to Vascular Surgery  3. Vitamin B12 deficiency - cyanocobalamin ((VITAMIN B-12)) injection 1,000 mcg  4. Hypothyroidism, unspecified type Recheck TSH at next OV  5. Need for prophylactic vaccination and  inoculation against influenza - Flu Vaccine QUAD High Dose(Fluad)  Return in about 2 months (around 07/14/2019).    Rutherford Guys, MD Primary Care at [place], [place] [address] Ph.  [phone] Fax [phone]

## 2019-05-15 ENCOUNTER — Ambulatory Visit (INDEPENDENT_AMBULATORY_CARE_PROVIDER_SITE_OTHER): Payer: PPO | Admitting: Family Medicine

## 2019-05-15 VITALS — BP 121/65 | Ht 59.0 in | Wt 164.0 lb

## 2019-05-15 DIAGNOSIS — Z0000 Encounter for general adult medical examination without abnormal findings: Secondary | ICD-10-CM

## 2019-05-15 NOTE — Patient Instructions (Signed)
Thank you for taking time to come for your Medicare Wellness Visit. I appreciate your ongoing commitment to your health goals. Please review the following plan we discussed and let me know if I can assist you in the future.  Leeanne Butters LPN  Preventive Care 83 Years and Older, Male Preventive care refers to lifestyle choices and visits with your health care provider that can promote health and wellness. This includes:  A yearly physical exam. This is also called an annual well check.  Regular dental and eye exams.  Immunizations.  Screening for certain conditions.  Healthy lifestyle choices, such as diet and exercise. What can I expect for my preventive care visit? Physical exam Your health care provider will check:  Height and weight. These may be used to calculate body mass index (BMI), which is a measurement that tells if you are at a healthy weight.  Heart rate and blood pressure.  Your skin for abnormal spots. Counseling Your health care provider may ask you questions about:  Alcohol, tobacco, and drug use.  Emotional well-being.  Home and relationship well-being.  Sexual activity.  Eating habits.  History of falls.  Memory and ability to understand (cognition).  Work and work environment. What immunizations do I need?  Influenza (flu) vaccine  This is recommended every year. Tetanus, diphtheria, and pertussis (Tdap) vaccine  You may need a Td booster every 10 years. Varicella (chickenpox) vaccine  You may need this vaccine if you have not already been vaccinated. Zoster (shingles) vaccine  You may need this after age 60. Pneumococcal conjugate (PCV13) vaccine  One dose is recommended after age 65. Pneumococcal polysaccharide (PPSV23) vaccine  One dose is recommended after age 65. Measles, mumps, and rubella (MMR) vaccine  You may need at least one dose of MMR if you were born in 1957 or later. You may also need a second dose. Meningococcal  conjugate (MenACWY) vaccine  You may need this if you have certain conditions. Hepatitis A vaccine  You may need this if you have certain conditions or if you travel or work in places where you may be exposed to hepatitis A. Hepatitis B vaccine  You may need this if you have certain conditions or if you travel or work in places where you may be exposed to hepatitis B. Haemophilus influenzae type b (Hib) vaccine  You may need this if you have certain conditions. You may receive vaccines as individual doses or as more than one vaccine together in one shot (combination vaccines). Talk with your health care provider about the risks and benefits of combination vaccines. What tests do I need? Blood tests  Lipid and cholesterol levels. These may be checked every 5 years, or more frequently depending on your overall health.  Hepatitis C test.  Hepatitis B test. Screening  Lung cancer screening. You may have this screening every year starting at age 55 if you have a 30-pack-year history of smoking and currently smoke or have quit within the past 15 years.  Colorectal cancer screening. All adults should have this screening starting at age 50 and continuing until age 75. Your health care provider may recommend screening at age 45 if you are at increased risk. You will have tests every 1-10 years, depending on your results and the type of screening test.  Prostate cancer screening. Recommendations will vary depending on your family history and other risks.  Diabetes screening. This is done by checking your blood sugar (glucose) after you have not eaten for   for a while (fasting). You may have this done every 1-3 years.  Abdominal aortic aneurysm (AAA) screening. You may need this if you are a current or former smoker.  Sexually transmitted disease (STD) testing. Follow these instructions at home: Eating and drinking  Eat a diet that includes fresh fruits and vegetables, whole grains, lean  protein, and low-fat dairy products. Limit your intake of foods with high amounts of sugar, saturated fats, and salt.  Take vitamin and mineral supplements as recommended by your health care provider.  Do not drink alcohol if your health care provider tells you not to drink.  If you drink alcohol: ? Limit how much you have to 0-2 drinks a day. ? Be aware of how much alcohol is in your drink. In the [place] one drink equals one 12 oz bottle of beer (355 mL), one 5 oz glass of wine (148 mL), or one 1 oz glass of hard liquor (44 mL). Lifestyle  Take daily care of your teeth and gums.  Stay active. Exercise for at least 30 minutes on 5 or more days each week.  Do not use any products that contain nicotine or tobacco, such as cigarettes, e-cigarettes, and chewing tobacco. If you need help quitting, ask your health care provider.  If you are sexually active, practice safe sex. Use a condom or other form of protection to prevent STIs (sexually transmitted infections).  Talk with your health care provider about taking a low-dose aspirin or statin. What's next?  Visit your health care provider once a year for a well check visit.  Ask your health care provider how often you should have your eyes and teeth checked.  Stay up to date on all vaccines. This information is not intended to replace advice given to you by your health care provider. Make sure you discuss any questions you have with your health care provider. Document Released: 09/16/2015 Document Revised: 08/14/2018 Document Reviewed: 08/14/2018 Elsevier Patient Education  2020 [employer].

## 2019-05-15 NOTE — Progress Notes (Signed)
Presents today for [employer] Visit   Date of last exam: 05/14/2019  Interpreter used for this visit? No  I connected with  Karolee Ohs on 05/15/19 by a telephone  application and verified that I am speaking with the correct person using two identifiers.  .   Patient Care Team: Rutherford Guys, MD as PCP - General (Family Medicine) Nahser, Wonda Cheng, MD as PCP - Cardiology (Cardiology) Garald Balding, MD (Orthopedic Surgery) Charlotte Crumb, MD (Orthopedic Surgery) McVey, Gelene Mink, PA-C as Physician Assistant (Physician Assistant) Elmarie Shiley, MD as Consulting Physician (Nephrology)   Other items to address today:   Discussed immunizations Discussed Eye/Dental Just seen 05/14/2019 for his leg pain Patient has follow up with Dr. Pamella Pert 07-2019   Other Screening: Last screening for diabetes: 01/15/2019 Last lipid screening: 10/02/2018  ADVANCE DIRECTIVES: Discussed: yes On File:no Materials Provided: yes (mailed)  Immunization status:  Immunization History  Administered Date(s) Administered  . Fluad Quad(high Dose 65+) 05/14/2019  . Influenza Split 06/20/2011, 05/29/2012  . Influenza, High Dose Seasonal PF 06/21/2017, 06/22/2018  . Influenza,inj,Quad PF,6+ Mos 05/26/2013, 06/28/2015, 06/04/2016  . Influenza-Unspecified 07/09/2014, 06/03/2017  . Pneumococcal Conjugate-13 12/28/2014  . Pneumococcal Polysaccharide-23 09/03/2004  . Td 01/08/2014     Health Maintenance Due  Topic Date Due  . COLONOSCOPY  11/23/2014  . OPHTHALMOLOGY EXAM  12/08/2014  . URINE MICROALBUMIN  12/22/2014  . FOOT EXAM  02/08/2019     Functional Status Survey: Is the patient deaf or have difficulty hearing?: Yes Does the patient have difficulty seeing, even when wearing glasses/contacts?: No Does the patient have difficulty concentrating, remembering, or making decisions?: No Does the patient have difficulty walking or climbing stairs?: Yes(he  uses cane can get up just goes slow) Does the patient have difficulty dressing or bathing?: No Does the patient have difficulty doing errands alone such as visiting a doctor's office or shopping?: No   6CIT Screen 05/15/2019  What Year? 0 points  What month? 0 points  What time? 0 points  Count back from 20 0 points  Months in reverse 0 points  Repeat phrase 0 points  Total Score 0        Clinical Support from 05/15/2019 in [facility]  AUDIT-C Score  0       Home Environment:   Lives in a one story  Does not climb stairs if he can avoid legs hurt  can get up and down with walking cane No scattered rugs No grab bars Adequate lighting/no clutter   Patient Active Problem List   Diagnosis Date Noted  . Chronic combined systolic and diastolic CHF (congestive heart failure) ([place]) 03/30/2019  . Cellulitis, leg 03/29/2019  . Memory loss 12/05/2017  . Orthostatic dizziness 12/05/2017  . Idiopathic peripheral neuropathy 12/05/2017  . PAF (paroxysmal atrial fibrillation) ([place]) 04/30/2017  . Shortness of breath 06/04/2016  . Chronic systolic CHF (congestive heart failure) ([place]) 06/04/2016  . Bilateral leg edema 12/12/2015  . Dizziness 09/27/2015  . Primary localized osteoarthritis of left knee 07/12/2015  . Weakness 02/23/2015  . DVT (deep venous thrombosis) ([place]) 12/14/2014  . Combined congestive systolic and diastolic heart failure ([place]) 12/14/2014  . Cerebral infarction due to unspecified mechanism   . TIA (transient ischemic attack) 08/01/2014  . Varicose veins of both legs with edema 05/07/2014  . Bilateral leg pain 04/16/2014  . Chronic kidney disease, stage III (moderate) ([place]) 12/21/2013  . Vascular dementia without behavioral disturbance (  [place])   . Type 2 diabetes mellitus, controlled, with renal complications ([place]) 123456  . Diastolic dysfunction [id number]  . Knee pain, bilateral 03/01/2013  . Hereditary and idiopathic peripheral neuropathy  11/26/2012  . Osteoporosis, unspecified 11/26/2012  . GI bleed due to NSAIDs, DDX=Isch colitis, infectious colitis 11/19/2012  . Cough 05/29/2012  . 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 CONSTIPATION, CHRONIC 08/02/2010  . INSOMNIA, CHRONIC 03/08/2010  . Hypothyroidism 01/04/2010  . VITAMIN B12 DEFICIENCY 01/04/2010  . HYPERCHOLESTEROLEMIA 08/12/2009  . Essential hypertension 08/12/2009  . CAD (coronary artery disease) 08/12/2009  . GERD 08/12/2009     Past Medical History:  Diagnosis Date  . Arthritis   . Arthropathy, unspecified, site unspecified   . Atrial fibrillation ([place])   . Blood dyscrasia    thromboctopenia pt family states he was never told of this  . CAD (coronary artery disease)    last cath in 2012. Managed medically-some blockages  . Chronic lower back pain   . Chronic systolic CHF (congestive heart failure) ([place]) 06/04/2016  . Constipation, chronic   . Diabetes mellitus without complication ([place])    pt states he doesn't have  . DVT (deep venous thrombosis) ([place])   . Dysrhythmia   . Esophageal reflux   . 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 MRI Nov '11 - tendonosis and partial tear. Left shoulder surgery Jan '12 for partial shoulder replacement. [name])   . Headache(784.0)   . History of stomach ulcers 11/2012  . Hypothyroidism    pt states he doeen't have   . Orthostasis   . Other B-complex deficiencies   . Other malaise and fatigue   . Pain in joint, shoulder region    Has chronic shoulder pain  . Pain in limb   . Persistent disorder of initiating or maintaining sleep   . Pneumonia 1980's?; 2011  . Primary localized osteoarthritis of left knee 07/12/2015  . Pure hypercholesterolemia   . PVC's (premature ventricular contractions)   . S/P cardiac cath 08/08/11   mild to moderate CAD primarily in the LAD. None are obstructive and appear stable from prior cath in 2007; managed medically  . Sebaceous cyst   .  Stroke ([place])   . Thrombocytopenia, unspecified ([place])   . Unspecified essential hypertension   . Vascular dementia without behavioral disturbance ([facility])    with periods of amnesia     Past Surgical History:  Procedure Laterality Date  . BACK SURGERY    . CARDIAC CATHETERIZATION  08/2011  . CATARACT EXTRACTION Left 2009  . COLONOSCOPY    . ESOPHAGOGASTRODUODENOSCOPY N/A 11/19/2012   Procedure: ESOPHAGOGASTRODUODENOSCOPY (EGD);  Surgeon: Jerene Bears, MD;  Location: [facility];  Service: Gastroenterology;  Laterality: N/A;  . FINGER AMPUTATION Right    pinky finger  . HAND SURGERY Right    crush injury, right fifth digit contracture, limited  motion  . HARDWARE REMOVAL  02/05/2012   Procedure: HARDWARE REMOVAL;  Surgeon: Johnny Bridge, MD;  Location: [facility];  Service: Orthopedics;  Laterality: Left;  . KNEE SURGERY Left 1991   "did it twice in 1 wk" (02/17/2013)  . LUMBAR SPINE SURGERY  2007   Dr Philip Aspen ([place])  . REVERSE SHOULDER ARTHROPLASTY  02/05/2012   Procedure: REVERSE SHOULDER ARTHROPLASTY;  Surgeon: Johnny Bridge, MD;  Location: [facility];  Service: Orthopedics;  Laterality: Left;  . SHOULDER HEMI-ARTHROPLASTY    . SHOULDER OPEN ROTATOR CUFF REPAIR Left 8.30.2011  . TOTAL KNEE  ARTHROPLASTY Left 07/12/2015   Procedure: TOTAL KNEE ARTHROPLASTY;  Surgeon: Marchia Bond, MD;  Location: [facility];  Service: Orthopedics;  Laterality: Left;  . US ECHOCARDIOGRAPHY  02-28-2010   Est EF 50-55%     Family History  Problem Relation Age of Onset  . Breast cancer Mother   . Cancer Mother   . Diabetes Mother   . Cancer Brother        throat  . Stroke Brother   . Kidney disease Father   . Diabetes Sister   . Diabetes Daughter   . Colon cancer Neg Hx   . Heart attack Neg Hx      Social History   Socioeconomic History  . Marital status: Widowed    Spouse name: Not on file  . Number of children: 2  . Years of education: 3rd  . Highest education level: Not on file   Occupational History  . Occupation: retired    Fish farm manager: RETIRED  Social Needs  . Financial resource strain: Not on file  . Food insecurity    Worry: Not on file    Inability: Not on file  . Transportation needs    Medical: Not on file    Non-medical: Not on file  Tobacco Use  . Smoking status: Former Smoker    Types: Cigars    Quit date: 09/03/1978    Years since quitting: 40.7  . Smokeless tobacco: Current User    Types: Chew  . Tobacco comment: 02/17/2013 "I aien't smoked a cigar in 20-30 yr"  Substance and Sexual Activity  . Alcohol use: No    Alcohol/week: 0.0 standard drinks  . Drug use: No  . Sexual activity: Never  Lifestyle  . Physical activity    Days per week: Not on file    Minutes per session: Not on file  . Stress: Not on file  Relationships  . Social Herbalist on phone: Not on file    Gets together: Not on file    Attends religious service: Not on file    Active member of club or organization: Not on file    Attends meetings of clubs or organizations: Not on file    Relationship status: Not on file  . Intimate partner violence    Fear of current or ex partner: Not on file    Emotionally abused: Not on file    Physically abused: Not on file    Forced sexual activity: Not on file  Other Topics Concern  . Not on file  Social History Narrative   3rd grade education. Married '58, '95. 2 dtr '59, '64, youngest daughter died septic kidney.    Lives alone, handles all ADLs   Right handed.   1 cup coffee per day, occasional soda or tea.      Has living will   Daughter [name] is health care POA   Would accept resuscitation attempts--- but no prolonged ventilation.   Not sure about tube feeds.              Allergies  Allergen Reactions  . Crestor [Rosuvastatin Calcium] Other (See Comments)    Muscle pain/aches  . Mestinon [Pyridostigmine Bromide Er] Other (See Comments)    GI upset, stomach cramps     Prior to Admission medications    Medication Sig Start Date End Date Taking? Authorizing Provider  aspirin EC 81 MG tablet Take 81 mg by mouth daily.    Yes [provider]  docusate  sodium (COLACE) 100 MG capsule Take 1 capsule (100 mg total) by mouth every 12 (twelve) hours. 07/29/18  Yes Harris, Abigail, PA-C  bismuth subsalicylate (PEPTO BISMOL) 262 MG/15ML suspension Take 30 mLs by mouth daily as needed for indigestion or diarrhea or loose stools.     [provider]  diclofenac sodium (VOLTAREN) 1 % GEL Apply 2 g topically 4 (four) times daily. Patient not taking: Reported on 05/15/2019 08/04/18   Rutherford Guys, MD  furosemide (LASIX) 40 MG tablet Take 1 tablet (40 mg total) by mouth daily. 07/03/18   Nahser, Wonda Cheng, MD  gabapentin (NEURONTIN) 300 MG capsule Take 2 capsules (600 mg total) by mouth 3 (three) times daily. 04/19/19   Forrest Moron, MD  isosorbide mononitrate (IMDUR) 30 MG 24 hr tablet Take 1 tablet (30 mg total) by mouth daily. 10/02/18   Nahser, Wonda Cheng, MD  levothyroxine (SYNTHROID) 50 MCG tablet Take 1 tablet (50 mcg total) by mouth every other day. 30 minutes before breakfast. Alternated with 75mcg tablet. 03/10/19   Rutherford Guys, MD  levothyroxine (SYNTHROID) 75 MCG tablet Take 1 tablet (75 mcg total) by mouth every other day. 30 minutes before breakfast. Alternate with 76mcg tablet. 03/10/19   Rutherford Guys, MD  lidocaine (LIDODERM) 5 % Place 1 patch onto the skin daily. Remove & Discard patch within 12 hours or as directed by MD 09/04/18   Rutherford Guys, MD  magnesium hydroxide (MILK OF MAGNESIA) 400 MG/5ML suspension Take 15 mLs by mouth daily as needed for mild constipation or moderate constipation.    [provider]  nitroGLYCERIN (NITROSTAT) 0.4 MG SL tablet Place 1 tablet (0.4 mg total) under the tongue every 5 (five) minutes as needed for chest pain. 07/03/18   Nahser, Wonda Cheng, MD  omeprazole (PRILOSEC) 20 MG capsule Take 1 capsule (20 mg total) by mouth 2 (two)  times daily before a meal. 07/01/18   McVey, Gelene Mink, PA-C  potassium chloride SA (K-DUR) 20 MEQ tablet Take 1 tablet (20 mEq total) by mouth 2 (two) times daily. 03/25/19   Maryruth Hancock, MD     Depression screen [facility] 2/9 05/15/2019 05/14/2019 04/13/2019 01/21/2019 12/11/2018  Decreased Interest 0 0 0 0 0  Down, Depressed, Hopeless 0 0 0 0 0  PHQ - 2 Score 0 0 0 0 0  Some recent data might be hidden     Fall Risk  05/15/2019 05/14/2019 04/13/2019 01/21/2019 12/11/2018  Falls in the past year? 0 0 0 1 1  Number falls in past yr: 0 0 0 0 0  Injury with Fall? 0 0 0 1 1  Comment - - - - pt states due to his dog  Follow up Falls evaluation completed;Education provided;Falls prevention discussed - - - -      PHYSICAL EXAM: BP 121/65 Comment: taken from previous visit  Ht 4\' 11"  (1.499 m)   Wt 164 lb (74.4 kg) Comment: previous visit  BMI 33.12 kg/m    Wt Readings from Last 3 Encounters:  05/15/19 164 lb (74.4 kg)  05/14/19 164 lb (74.4 kg)  04/13/19 167 lb (75.8 kg)     Medicare annual wellness visit, subsequent    Physical Exam   Education/Counseling provided regarding diet and exercise, prevention of chronic diseases, smoking/tobacco cessation, if applicable, and reviewed "Covered Medicare Preventive Services."

## 2019-05-28 ENCOUNTER — Telehealth: Payer: Self-pay | Admitting: Family Medicine

## 2019-05-28 ENCOUNTER — Telehealth: Payer: Self-pay

## 2019-05-28 NOTE — Telephone Encounter (Signed)
05/28/2019 - MR. Resor'S DAUGHTER CALLED TO TELL DR. SANTIAGO THAT HE CAN NOT WAIT UNTIL July 06, 2019 TO HAVE THE SCAN OF HIS LEG. HE IS IN [facility] PAIN. SHE WOULD LIKE TO KNOW WHAT DR. SANTIAGO SUGGEST HE DO? BEST PHONE [phone] (SISTER'S NAME IS [name] REID AND SHE IS ON  HIS HIPAA AND HIS POA) [name] Elliott

## 2019-05-28 NOTE — Telephone Encounter (Signed)
Spoke with pt daughter, she says there is no way a stocking can go on the pt leg right now due to swelling. She says she will give him the tramadol that she already has leftover. I will follow up with the pt in the morning after speaking with vein clinic to see if earlier appt can be made.

## 2019-05-28 NOTE — Telephone Encounter (Signed)
Have her try ace wraps instead of compression stockings. thanks

## 2019-05-29 ENCOUNTER — Other Ambulatory Visit: Payer: Self-pay | Admitting: Family Medicine

## 2019-05-29 MED ORDER — TRAMADOL HCL 50 MG PO TABS: 50.0000 mg | ORAL_TABLET | Freq: Three times a day (TID) | ORAL | 0 refills | Status: AC | PRN

## 2019-06-01 ENCOUNTER — Telehealth: Payer: Self-pay

## 2019-06-01 NOTE — Telephone Encounter (Signed)
Pt vascular and vein appt was moved from Nov to Oct 5th at 11 am.

## 2019-06-03 ENCOUNTER — Other Ambulatory Visit: Payer: Self-pay

## 2019-06-03 DIAGNOSIS — R0989 Other specified symptoms and signs involving the circulatory and respiratory systems: Secondary | ICD-10-CM

## 2019-06-08 ENCOUNTER — Ambulatory Visit (INDEPENDENT_AMBULATORY_CARE_PROVIDER_SITE_OTHER): Payer: PPO | Admitting: Surgery

## 2019-06-08 ENCOUNTER — Ambulatory Visit (HOSPITAL_COMMUNITY)
Admission: RE | Admit: 2019-06-08 | Discharge: 2019-06-08 | Disposition: A | Discharge status: Home / Self Care | Payer: PPO | Source: Ambulatory Visit | Attending: Family | Admitting: Family

## 2019-06-08 ENCOUNTER — Other Ambulatory Visit: Payer: Self-pay

## 2019-06-08 ENCOUNTER — Encounter: Payer: Self-pay | Admitting: Surgery

## 2019-06-08 VITALS — BP 127/64 | HR 63 | Temp 97.9°F | Resp 20 | Ht 59.0 in | Wt 169.2 lb

## 2019-06-08 DIAGNOSIS — R0989 Other specified symptoms and signs involving the circulatory and respiratory systems: Secondary | ICD-10-CM | POA: Diagnosis not present

## 2019-06-08 DIAGNOSIS — M79661 Pain in right lower leg: Secondary | ICD-10-CM

## 2019-06-08 DIAGNOSIS — M7989 Other specified soft tissue disorders: Secondary | ICD-10-CM

## 2019-06-08 NOTE — Progress Notes (Signed)
[facility] [place]  Patient name: [name] Elliott MRN: [id number] DOB: [birth year] Sex: male   REQUESTING PROVIDER:    Grant Fontana   REASON FOR CONSULT:    Decreased pulses  HISTORY OF PRESENT ILLNESS:   [name] Elliott is a 85 y.o. male, who is referred for evaluation of leg pain.  Patient states that he has been having issues for 2 to 3 years.  His right calf is painful to the touch.  His pain comes about with walking and with sitting still.  It does wake him up at night.  He has tried to wear compression stockings but cannot get them on.  He has been treated for cellulitis recently.  His left leg bothers him but not as bad.  Patient has a history of coronary artery disease.  He suffers from chronic systolic congestive heart failure he has a history of stroke as well as vascular dementia  PAST MEDICAL HISTORY    Past Medical History:  Diagnosis Date  . Arthritis   . Arthropathy, unspecified, site unspecified   . Atrial fibrillation ([place])   . Blood dyscrasia    thromboctopenia pt family states he was never told of this  . CAD (coronary artery disease)    last cath in 2012. Managed medically-some blockages  . Chronic lower back pain   . Chronic systolic CHF (congestive heart failure) ([place]) 06/04/2016  . Constipation, chronic   . Diabetes mellitus without complication ([place])    pt states he doesn't have  . DVT (deep venous thrombosis) ([place])   . Dysrhythmia   . Esophageal reflux   . 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 MRI Nov '11 - tendonosis and partial tear. Left shoulder surgery Jan '12 for partial shoulder replacement. [name])   . Headache(784.0)   . History of stomach ulcers 11/2012  . Hypothyroidism    pt states he doeen't have   . Orthostasis   . Other B-complex deficiencies   . Other malaise and fatigue   . Pain in joint, shoulder region    Has chronic shoulder pain  . Pain  in limb   . Persistent disorder of initiating or maintaining sleep   . Pneumonia 1980's?; 2011  . Primary localized osteoarthritis of left knee 07/12/2015  . Pure hypercholesterolemia   . PVC's (premature ventricular contractions)   . S/P cardiac cath 08/08/11   mild to moderate CAD primarily in the LAD. None are obstructive and appear stable from prior cath in 2007; managed medically  . Sebaceous cyst   . Stroke ([place])   . Thrombocytopenia, unspecified ([place])   . Unspecified essential hypertension   . Vascular dementia without behavioral disturbance ([facility])    with periods of amnesia     FAMILY HISTORY   Family History  Problem Relation Age of Onset  . Breast cancer Mother   . Cancer Mother   . Diabetes Mother   . Cancer Brother        throat  . Stroke Brother   . Kidney disease Father   . Diabetes Sister   . Diabetes Daughter   . Colon cancer Neg Hx   . Heart attack Neg Hx     SOCIAL HISTORY:   Social History   Socioeconomic History  . Marital status: Widowed    Spouse name: Not on file  . Number of children: 2  . Years of education: 3rd  . Highest education level: Not on file  Occupational History  .  Occupation: retired    Fish farm manager: RETIRED  Social Needs  . Financial resource strain: Not on file  . Food insecurity    Worry: Not on file    Inability: Not on file  . Transportation needs    Medical: Not on file    Non-medical: Not on file  Tobacco Use  . Smoking status: Former Smoker    Types: Cigars    Quit date: 09/03/1978    Years since quitting: 40.7  . Smokeless tobacco: Current User    Types: Chew  . Tobacco comment: 02/17/2013 "I aien't smoked a cigar in 20-30 yr"  Substance and Sexual Activity  . Alcohol use: No    Alcohol/week: 0.0 standard drinks  . Drug use: No  . Sexual activity: Never  Lifestyle  . Physical activity    Days per week: Not on file    Minutes per session: Not on file  . Stress: Not on file  Relationships  . Social Product manager on phone: Not on file    Gets together: Not on file    Attends religious service: Not on file    Active member of club or organization: Not on file    Attends meetings of clubs or organizations: Not on file    Relationship status: Not on file  . Intimate partner violence    Fear of current or ex partner: Not on file    Emotionally abused: Not on file    Physically abused: Not on file    Forced sexual activity: Not on file  Other Topics Concern  . Not on file  Social History Narrative   3rd grade education. Married '58, '95. 2 dtr '59, '64, youngest daughter died septic kidney.    Lives alone, handles all ADLs   Right handed.   1 cup coffee per day, occasional soda or tea.      Has living will   Daughter [name] is health care POA   Would accept resuscitation attempts--- but no prolonged ventilation.   Not sure about tube feeds.             ALLERGIES:    Allergies  Allergen Reactions  . Crestor [Rosuvastatin Calcium] Other (See Comments)    Muscle pain/aches  . Mestinon [Pyridostigmine Bromide Er] Other (See Comments)    GI upset, stomach cramps    CURRENT MEDICATIONS:    Current Outpatient Medications  Medication Sig Dispense Refill  . aspirin EC 81 MG tablet Take 81 mg by mouth daily.     Marland Kitchen bismuth subsalicylate (PEPTO BISMOL) 262 MG/15ML suspension Take 30 mLs by mouth daily as needed for indigestion or diarrhea or loose stools.     . diclofenac sodium (VOLTAREN) 1 % GEL Apply 2 g topically 4 (four) times daily. 100 g 0  . docusate sodium (COLACE) 100 MG capsule Take 1 capsule (100 mg total) by mouth every 12 (twelve) hours. 30 capsule 0  . furosemide (LASIX) 40 MG tablet Take 1 tablet (40 mg total) by mouth daily. 90 tablet 3  . gabapentin (NEURONTIN) 300 MG capsule Take 2 capsules (600 mg total) by mouth 3 (three) times daily. 540 capsule 0  . isosorbide mononitrate (IMDUR) 30 MG 24 hr tablet Take 1 tablet (30 mg total) by mouth daily. 90 tablet 3  .  levothyroxine (SYNTHROID) 50 MCG tablet Take 1 tablet (50 mcg total) by mouth every other day. 30 minutes before breakfast. Alternated with 56mcg tablet. 45 tablet 0  .  levothyroxine (SYNTHROID) 75 MCG tablet Take 1 tablet (75 mcg total) by mouth every other day. 30 minutes before breakfast. Alternate with 76mcg tablet. 45 tablet 0  . lidocaine (LIDODERM) 5 % Place 1 patch onto the skin daily. Remove & Discard patch within 12 hours or as directed by MD 30 patch 0  . magnesium hydroxide (MILK OF MAGNESIA) 400 MG/5ML suspension Take 15 mLs by mouth daily as needed for mild constipation or moderate constipation.    . nitroGLYCERIN (NITROSTAT) 0.4 MG SL tablet Place 1 tablet (0.4 mg total) under the tongue every 5 (five) minutes as needed for chest pain. 25 tablet 6  . omeprazole (PRILOSEC) 20 MG capsule Take 1 capsule (20 mg total) by mouth 2 (two) times daily before a meal. 180 capsule 3  . potassium chloride SA (K-DUR) 20 MEQ tablet Take 1 tablet (20 mEq total) by mouth 2 (two) times daily. 180 tablet 0   Current Facility-Administered Medications  Medication Dose Route Frequency Provider Last Rate Last Dose  . cyanocobalamin ((VITAMIN B-12)) injection 1,000 mcg  1,000 mcg Intramuscular Q30 days Pamella Pert, Irma M, MD   1,000 mcg at 05/14/19 1748    REVIEW OF SYSTEMS:   [X]  denotes positive finding, [ ]  denotes negative finding Cardiac  Comments:  Chest pain or chest pressure:    Shortness of breath upon exertion:    Short of breath when lying flat:    Irregular heart rhythm:        Vascular    Pain in calf, thigh, or hip brought on by ambulation: x   Pain in feet at night that wakes you up from your sleep:  x   Blood clot in your veins: x   Leg swelling:  x       Pulmonary    Oxygen at home:    Productive cough:     Wheezing:         Neurologic    Sudden weakness in arms or legs:     Sudden numbness in arms or legs:     Sudden onset of difficulty speaking or slurred speech:     Temporary loss of vision in one eye:     Problems with dizziness:         Gastrointestinal    Blood in stool:      Vomited blood:         Genitourinary    Burning when urinating:     Blood in urine:        Psychiatric    Major depression:         Hematologic    Bleeding problems:    Problems with blood clotting too easily:        Skin    Rashes or ulcers:        Constitutional    Fever or chills:     PHYSICAL EXAM:   Vitals:   06/08/19 1149  BP: 127/64  Pulse: 63  Resp: 20  Temp: 97.9 F (36.6 C)  SpO2: 100%  Weight: 169 lb 3.2 oz (76.7 kg)  Height: 4\' 11"  (1.499 m)    GENERAL: The patient is a well-nourished male, in no acute distress. The vital signs are documented above. CARDIAC: There is a regular rate and rhythm.  VASCULAR: Palpable bilateral dorsalis pedis pulse.  Bilateral lower extremity edema PULMONARY: Nonlabored respirations ABDOMEN: Soft and non-tender with normal pitched bowel sounds.  MUSCULOSKELETAL: There are no major deformities or cyanosis. NEUROLOGIC: No focal weakness or  paresthesias are detected. SKIN: Dry eschar on the right lower leg with surrounding erythema.  Hyperpigmentation both ankles. PSYCHIATRIC: The patient has a normal affect.  STUDIES:   I have reviewed the following :  ABI/TBIToday's ABIToday's TBIPrevious ABIPrevious TBI +-------+-----------+-----------+------------+------------+ Right  [place]         0.64                                +-------+-----------+-----------+------------+------------+ Left   [place]         1.03                                +-------+-----------+-----------+------------+------------+  Right Toe:98 Left toe:156 ASSESSMENT and PLAN   Leg pain and swelling: Patient has had several ultrasounds which were negative for DVT.  He was recently treated for cellulitis.  I think the neck step is to evaluate him for venous insufficiency using a reflux evaluation.  This will be scheduled next  week.  In the meantime since he cannot tolerate compression stockings, I am going to place him in bilateral Unna boots to see if this helps improve some of his symptoms of discomfort.  He will return next week to remove the Unna boots and get his reflux examination.  If the reflux examination is negative, he may benefit from referral to the lymphedema clinic since he has issues with wearing compression.  If the reflux test is significant, he will be referred to the vein center for treatment.   Leia Alf, MD, FACS [facility] [phone] Pager [phone]

## 2019-06-15 ENCOUNTER — Other Ambulatory Visit: Payer: Self-pay

## 2019-06-15 DIAGNOSIS — M79661 Pain in right lower leg: Secondary | ICD-10-CM

## 2019-06-16 ENCOUNTER — Telehealth (HOSPITAL_COMMUNITY): Payer: Self-pay

## 2019-06-16 NOTE — Telephone Encounter (Signed)

## 2019-06-17 ENCOUNTER — Ambulatory Visit (HOSPITAL_COMMUNITY)
Admission: RE | Admit: 2019-06-17 | Discharge: 2019-06-17 | Disposition: A | Discharge status: Home / Self Care | Payer: PPO | Source: Ambulatory Visit | Attending: Family | Admitting: Family

## 2019-06-17 ENCOUNTER — Ambulatory Visit: Payer: PPO | Admitting: Physician Assistant

## 2019-06-17 ENCOUNTER — Other Ambulatory Visit: Payer: Self-pay

## 2019-06-17 VITALS — BP 108/59 | HR 62 | Temp 97.7°F | Resp 14 | Ht 64.0 in | Wt 170.1 lb

## 2019-06-17 DIAGNOSIS — I872 Venous insufficiency (chronic) (peripheral): Secondary | ICD-10-CM

## 2019-06-17 DIAGNOSIS — M79661 Pain in right lower leg: Secondary | ICD-10-CM | POA: Diagnosis not present

## 2019-06-17 DIAGNOSIS — M7989 Other specified soft tissue disorders: Secondary | ICD-10-CM | POA: Insufficient documentation

## 2019-06-17 NOTE — Progress Notes (Signed)
[facility]           [facility] [place]  History and Physical   [name] Elliott is a 85 y.o. (01/08/1928) male who presents with hx of RLE wound and swelling.   He was seen on 06/08/2019 by Dr. Trula Slade for left leg pain.  Patient states that he has been having issues for 2 to 3 years.  His right calf is painful to the touch.  His pain comes about with walking and with sitting still.  It does wake him up at night.  He has tried to wear compression stockings but cannot get them on.  He has been treated for cellulitis recently.  His left leg bothers him but not as bad.  Patient has a history of coronary artery disease.  He suffers from chronic systolic congestive heart failure he has a history of stroke as well as vascular dementia.  The pt did have palpable DP pulses bilaterally.  He felt he needed to be worked up for venous insuffiency and scheduled him for venous reflux study today with plans to remove his una boot and get study.  If negative, he felt it would be worth referral to the lymphedema clinic.  He is not able to wear compression.    ABI's on 06/08/2019 revealed the waveforms bilaterally in PT/DP were triphasic with non-compressible BLE arteries.  Right great toe pressure was 98 and left great toe pressure was 156.  The pt states that the [facility] boot has helped with the swelling in the right leg not getting any worse.  He states his left leg has stayed about the same.  The pt is not on a statin for cholesterol management.  The pt is on a daily aspirin.   The pt is not on meds for hypertension.   The pt is not diabetic.   Tobacco hx:  remote  Past Medical History:  Diagnosis Date  . Arthritis   . Arthropathy, unspecified, site unspecified   . Atrial fibrillation ([place])   . Blood dyscrasia    thromboctopenia pt family states he was never told of this  . CAD (coronary artery disease)    last cath in 2012. Managed medically-some blockages  . Chronic lower back  pain   . Chronic systolic CHF (congestive heart failure) ([place]) 06/04/2016  . Constipation, chronic   . Diabetes mellitus without complication ([place])    pt states he doesn't have  . DVT (deep venous thrombosis) ([place])   . Dysrhythmia   . Esophageal reflux   . 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 MRI Nov '11 - tendonosis and partial tear. Left shoulder surgery Jan '12 for partial shoulder replacement. [name])   . Headache(784.0)   . History of stomach ulcers 11/2012  . Hypothyroidism    pt states he doeen't have   . Orthostasis   . Other B-complex deficiencies   . Other malaise and fatigue   . Pain in joint, shoulder region    Has chronic shoulder pain  . Pain in limb   . Persistent disorder of initiating or maintaining sleep   . Pneumonia 1980's?; 2011  . Primary localized osteoarthritis of left knee 07/12/2015  . Pure hypercholesterolemia   . PVC's (premature ventricular contractions)   . S/P cardiac cath 08/08/11   mild to moderate CAD primarily in the LAD. None are obstructive and appear stable from prior cath in 2007; managed medically  . Sebaceous cyst   .  Stroke ([place])   . Thrombocytopenia, unspecified ([place])   . Unspecified essential hypertension   . Vascular dementia without behavioral disturbance ([facility])    with periods of amnesia    Past Surgical History:  Procedure Laterality Date  . BACK SURGERY    . CARDIAC CATHETERIZATION  08/2011  . CATARACT EXTRACTION Left 2009  . COLONOSCOPY    . ESOPHAGOGASTRODUODENOSCOPY N/A 11/19/2012   Procedure: ESOPHAGOGASTRODUODENOSCOPY (EGD);  Surgeon: Jerene Bears, MD;  Location: [facility];  Service: Gastroenterology;  Laterality: N/A;  . FINGER AMPUTATION Right    pinky finger  . HAND SURGERY Right    crush injury, right fifth digit contracture, limited  motion  . HARDWARE REMOVAL  02/05/2012   Procedure: HARDWARE REMOVAL;  Surgeon: Johnny Bridge, MD;  Location: [facility];  Service: Orthopedics;  Laterality:  Left;  . KNEE SURGERY Left 1991   "did it twice in 1 wk" (02/17/2013)  . LUMBAR SPINE SURGERY  2007   Dr Philip Aspen ([place])  . REVERSE SHOULDER ARTHROPLASTY  02/05/2012   Procedure: REVERSE SHOULDER ARTHROPLASTY;  Surgeon: Johnny Bridge, MD;  Location: [facility];  Service: Orthopedics;  Laterality: Left;  . SHOULDER HEMI-ARTHROPLASTY    . SHOULDER OPEN ROTATOR CUFF REPAIR Left 8.30.2011  . TOTAL KNEE ARTHROPLASTY Left 07/12/2015   Procedure: TOTAL KNEE ARTHROPLASTY;  Surgeon: Marchia Bond, MD;  Location: [facility];  Service: Orthopedics;  Laterality: Left;  . US ECHOCARDIOGRAPHY  02-28-2010   Est EF 50-55%    Social History   Socioeconomic History  . Marital status: Widowed    Spouse name: Not on file  . Number of children: 2  . Years of education: 3rd  . Highest education level: Not on file  Occupational History  . Occupation: retired    Fish farm manager: RETIRED  Social Needs  . Financial resource strain: Not on file  . Food insecurity    Worry: Not on file    Inability: Not on file  . Transportation needs    Medical: Not on file    Non-medical: Not on file  Tobacco Use  . Smoking status: Former Smoker    Types: Cigars    Quit date: 09/03/1978    Years since quitting: 40.8  . Smokeless tobacco: Current User    Types: Chew  . Tobacco comment: 02/17/2013 "I aien't smoked a cigar in 20-30 yr"  Substance and Sexual Activity  . Alcohol use: No    Alcohol/week: 0.0 standard drinks  . Drug use: No  . Sexual activity: Never  Lifestyle  . Physical activity    Days per week: Not on file    Minutes per session: Not on file  . Stress: Not on file  Relationships  . Social Herbalist on phone: Not on file    Gets together: Not on file    Attends religious service: Not on file    Active member of club or organization: Not on file    Attends meetings of clubs or organizations: Not on file    Relationship status: Not on file  . Intimate partner violence    Fear of current or  ex partner: Not on file    Emotionally abused: Not on file    Physically abused: Not on file    Forced sexual activity: Not on file  Other Topics Concern  . Not on file  Social History Narrative   3rd grade education. Married '58, '95. 2 dtr '59, '64, youngest daughter died septic kidney.  Lives alone, handles all ADLs   Right handed.   1 cup coffee per day, occasional soda or tea.      Has living will   Daughter [name] is health care POA   Would accept resuscitation attempts--- but no prolonged ventilation.   Not sure about tube feeds.              Family History  Problem Relation Age of Onset  . Breast cancer Mother   . Cancer Mother   . Diabetes Mother   . Cancer Brother        throat  . Stroke Brother   . Kidney disease Father   . Diabetes Sister   . Diabetes Daughter   . Colon cancer Neg Hx   . Heart attack Neg Hx     Current Outpatient Medications  Medication Sig Dispense Refill  . aspirin EC 81 MG tablet Take 81 mg by mouth daily.     Marland Kitchen bismuth subsalicylate (PEPTO BISMOL) 262 MG/15ML suspension Take 30 mLs by mouth daily as needed for indigestion or diarrhea or loose stools.     . diclofenac sodium (VOLTAREN) 1 % GEL Apply 2 g topically 4 (four) times daily. 100 g 0  . docusate sodium (COLACE) 100 MG capsule Take 1 capsule (100 mg total) by mouth every 12 (twelve) hours. 30 capsule 0  . furosemide (LASIX) 40 MG tablet Take 1 tablet (40 mg total) by mouth daily. 90 tablet 3  . gabapentin (NEURONTIN) 300 MG capsule Take 2 capsules (600 mg total) by mouth 3 (three) times daily. 540 capsule 0  . isosorbide mononitrate (IMDUR) 30 MG 24 hr tablet Take 1 tablet (30 mg total) by mouth daily. 90 tablet 3  . levothyroxine (SYNTHROID) 50 MCG tablet Take 1 tablet (50 mcg total) by mouth every other day. 30 minutes before breakfast. Alternated with 65mcg tablet. 45 tablet 0  . levothyroxine (SYNTHROID) 75 MCG tablet Take 1 tablet (75 mcg total) by mouth every other day.  30 minutes before breakfast. Alternate with 28mcg tablet. 45 tablet 0  . lidocaine (LIDODERM) 5 % Place 1 patch onto the skin daily. Remove & Discard patch within 12 hours or as directed by MD 30 patch 0  . magnesium hydroxide (MILK OF MAGNESIA) 400 MG/5ML suspension Take 15 mLs by mouth daily as needed for mild constipation or moderate constipation.    . nitroGLYCERIN (NITROSTAT) 0.4 MG SL tablet Place 1 tablet (0.4 mg total) under the tongue every 5 (five) minutes as needed for chest pain. 25 tablet 6  . omeprazole (PRILOSEC) 20 MG capsule Take 1 capsule (20 mg total) by mouth 2 (two) times daily before a meal. 180 capsule 3  . potassium chloride SA (K-DUR) 20 MEQ tablet Take 1 tablet (20 mEq total) by mouth 2 (two) times daily. 180 tablet 0   Current Facility-Administered Medications  Medication Dose Route Frequency Provider Last Rate Last Dose  . cyanocobalamin ((VITAMIN B-12)) injection 1,000 mcg  1,000 mcg Intramuscular Q30 days Rutherford Guys, MD   1,000 mcg at 05/14/19 1748    Allergies  Allergen Reactions  . Crestor [Rosuvastatin Calcium] Other (See Comments)    Muscle pain/aches  . Mestinon [Pyridostigmine Bromide Er] Other (See Comments)    GI upset, stomach cramps    REVIEW OF SYSTEMS:   [X]  denotes positive finding, [ ]  denotes negative finding Cardiac  Comments:  Chest pain or chest pressure:    Shortness of breath upon exertion:  Short of breath when lying flat:    Irregular heart rhythm:        Vascular    Pain in calf, thigh, or hip brought on by ambulation:    Pain in feet at night that wakes you up from your sleep:     Blood clot in your veins:    Leg swelling:  x       Pulmonary    Oxygen at home:    Productive cough:     Wheezing:         Neurologic    Sudden weakness in arms or legs:     Sudden numbness in arms or legs:     Sudden onset of difficulty speaking or slurred speech:    Temporary loss of vision in one eye:     Problems with dizziness:          Gastrointestinal    Blood in stool:     Vomited blood:         Genitourinary    Burning when urinating:     Blood in urine:        Psychiatric    Major depression:         Hematologic    Bleeding problems:    Problems with blood clotting too easily:        Skin    Rashes or ulcers:        Constitutional    Fever or chills:      PHYSICAL EXAMINATION:  Today's Vitals   06/17/19 1529  BP: (!) 108/59  Pulse: 62  Resp: 14  Temp: 97.7 F (36.5 C)  TempSrc: Temporal  SpO2: 98%  Weight: 170 lb 1.6 oz (77.2 kg)  Height: 5\' 4"  (1.626 m)  PainSc: 5    Body mass index is 29.2 kg/m.   General:  WDWN in NAD; vital signs documented above Gait: Not observed HENT: WNL, normocephalic Pulmonary: normal non-labored breathing  Cardiac: regular HR Skin: without rashes Vascular Exam/Pulses: + palpable DP pulses bilaterally Extremities: LLE  RLE    Musculoskeletal: no muscle wasting or atrophy  Neurologic: A&O X 3;  No focal weakness or paresthesias are detected Psychiatric:  The pt has Normal affect.   Non-Invasive Vascular Imaging:   Venous duplex on 06/17/2019: Venous Reflux Times Normal value < 0.5 sec +------------------------------+----------+---------+                               Right (ms)Left (ms) +------------------------------+----------+---------+ FV                            1929.00             +------------------------------+----------+---------+ Popliteal                     1606.00             +------------------------------+----------+---------+ GSV at Saphenofemoral junction888.00              +------------------------------+----------+---------+ GSV prox thigh                1496.00             +------------------------------+----------+---------+ GSV mid thigh                 1372.00             +------------------------------+----------+---------+ GSV dist thigh  1467.00              +------------------------------+----------+---------+ GSV at knee                   1445.00             +------------------------------+----------+---------+ GSV prox calf                 968.00              +------------------------------+----------+---------+ SSV origin                    2252.00             +------------------------------+----------+---------+ SSV prox                      1122.00             +------------------------------+----------+---------+ SSV mid                       1496.00             +------------------------------+----------+---------+  +------------------------------+----------+---------+ VEIN DIAMETERS:               Right (cm)Left (cm) +------------------------------+----------+---------+ GSV at Saphenofemoral junction0.706               +------------------------------+----------+---------+ GSV at prox thigh             0.434               +------------------------------+----------+---------+ GSV at mid thigh              0.456               +------------------------------+----------+---------+ GSV at distal thigh           0.648               +------------------------------+----------+---------+ GSV at knee                   0.553               +------------------------------+----------+---------+ GSV prox calf                 0.544               +------------------------------+----------+---------+ SSV origin                    0.412               +------------------------------+----------+---------+ SSV prox                      0.269               +------------------------------+----------+---------+ SSV mid                       0.353               +------------------------------+----------+---------+  Summary: Right: Abnormal reflux times were noted in the femoral vein in the thigh, popliteal vein, great saphenous vein at the saphenofemoral junction, great saphenous vein at the  proximal thigh, great saphenous vein at the mid thigh, great saphenous vein at the  distal thigh, great saphenous vein at the knee, great saphenous vein at the prox calf, origin of the small saphenous vein, proximal small saphenous vein, and mid small saphenous vein.  There is no evidence of deep  vein thrombosis in the lower extremity.There is no evidence of superficial venous thrombosis.   [name] Elliott is a 85 y.o. male who presents with: hx of leg pain and swelling with recent venous duplex negative for DVT who was referred back today for venous reflux study.  Pt unable to tolerate compression socks and was placed in una boots.    -Pt does have evidence of venous insufficiency with wound on RLE and measurements as above.  -will bring pt back in a week to see Dr. Scot Dock and have una boots changed.  Alleen Borne R., vein center nurse will call pt to schedule appt for next week and get in contact with pt's daughter.   Leontine Locket, [facility] 06/17/2019 11:12 AM  Clinic MD:  Scot Dock

## 2019-06-19 NOTE — Telephone Encounter (Signed)
Called pt and daughter several times  to remind of appt on 10/22. No answering machine available

## 2019-06-25 ENCOUNTER — Encounter: Payer: Self-pay | Admitting: Vascular Surgery

## 2019-06-25 ENCOUNTER — Ambulatory Visit: Payer: PPO | Admitting: Vascular Surgery

## 2019-06-25 ENCOUNTER — Other Ambulatory Visit: Payer: Self-pay

## 2019-06-25 VITALS — BP 116/66 | HR 66 | Temp 97.0°F | Resp 16 | Ht 64.0 in | Wt 170.1 lb

## 2019-06-25 DIAGNOSIS — I872 Venous insufficiency (chronic) (peripheral): Secondary | ICD-10-CM

## 2019-06-25 NOTE — Progress Notes (Signed)
Patient name: [name] Elliott MRN: [id number] DOB: 1928/06/14 Sex: male  REASON FOR VISIT:   Follow-up  HPI:   [name] Elliott is a pleasant 85 y.o. male who was seen by Dr. Annamarie Major 2 weeks ago with leg pain.  At that time he had pain in the right calf which was brought on by ambulation and relieved with rest.  He has some symptoms on the left leg but his symptoms on the right are more significant.  He is 85 years old and has chronic systolic congestive heart failure in addition he has had a previous stroke and has a history of vascular dementia.  On exam he was noted to have palpable dorsalis pedis pulses bilaterally.  He had bilateral lower extremity edema.  I am seeing the patient for the first time.  He does not describe significant pain associated with his venous stasis changes on the right.  His activity is fairly limited.  He is 86 with a history of congestive heart failure.  He denies fever or chills.  Past Medical History:  Diagnosis Date  . Arthritis   . Arthropathy, unspecified, site unspecified   . Atrial fibrillation ([place])   . Blood dyscrasia    thromboctopenia pt family states he was never told of this  . CAD (coronary artery disease)    last cath in 2012. Managed medically-some blockages  . Chronic lower back pain   . Chronic systolic CHF (congestive heart failure) ([place]) 06/04/2016  . Constipation, chronic   . Diabetes mellitus without complication ([place])    pt states he doesn't have  . DVT (deep venous thrombosis) ([place])   . Dysrhythmia   . Esophageal reflux   . 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 MRI Nov '11 - tendonosis and partial tear. Left shoulder surgery Jan '12 for partial shoulder replacement. [name])   . Headache(784.0)   . History of stomach ulcers 11/2012  . Hypothyroidism    pt states he doeen't have   . Orthostasis   . Other B-complex deficiencies   . Other malaise and fatigue   . Pain in joint, shoulder region    Has chronic shoulder pain  . Pain in limb   . Persistent disorder of initiating or maintaining sleep   . Pneumonia 1980's?; 2011  . Primary localized osteoarthritis of left knee 07/12/2015  . Pure hypercholesterolemia   . PVC's (premature ventricular contractions)   . S/P cardiac cath 08/08/11   mild to moderate CAD primarily in the LAD. None are obstructive and appear stable from prior cath in 2007; managed medically  . Sebaceous cyst   . Stroke ([place])   . Thrombocytopenia, unspecified ([place])   . Unspecified essential hypertension   . Vascular dementia without behavioral disturbance ([facility])    with periods of amnesia    Family History  Problem Relation Age of Onset  . Breast cancer Mother   . Cancer Mother   . Diabetes Mother   . Cancer Brother        throat  . Stroke Brother   . Kidney disease Father   . Diabetes Sister   . Diabetes Daughter   . Colon cancer Neg Hx   . Heart attack Neg Hx     SOCIAL HISTORY: Social History   Tobacco Use  . Smoking status: Former Smoker    Types: Cigars    Quit date: 09/03/1978    Years since quitting: 40.8  . Smokeless tobacco: Current User  Types: Chew  . Tobacco comment: 02/17/2013 "I aien't smoked a cigar in 20-30 yr"  Substance Use Topics  . Alcohol use: No    Alcohol/week: 0.0 standard drinks    Allergies  Allergen Reactions  . Crestor [Rosuvastatin Calcium] Other (See Comments)    Muscle pain/aches  . Mestinon [Pyridostigmine Bromide Er] Other (See Comments)    GI upset, stomach cramps    Current Outpatient Medications  Medication Sig Dispense Refill  . aspirin EC 81 MG tablet Take 81 mg by mouth daily.     Marland Kitchen bismuth subsalicylate (PEPTO BISMOL) 262 MG/15ML suspension Take 30 mLs by mouth daily as needed for indigestion or diarrhea or loose stools.     . diclofenac sodium (VOLTAREN) 1 % GEL Apply 2 g topically 4 (four) times daily. 100 g 0  . docusate sodium (COLACE) 100 MG capsule Take 1 capsule (100 mg total) by mouth  every 12 (twelve) hours. 30 capsule 0  . furosemide (LASIX) 40 MG tablet Take 1 tablet (40 mg total) by mouth daily. 90 tablet 3  . gabapentin (NEURONTIN) 300 MG capsule Take 2 capsules (600 mg total) by mouth 3 (three) times daily. 540 capsule 0  . isosorbide mononitrate (IMDUR) 30 MG 24 hr tablet Take 1 tablet (30 mg total) by mouth daily. 90 tablet 3  . levothyroxine (SYNTHROID) 50 MCG tablet Take 1 tablet (50 mcg total) by mouth every other day. 30 minutes before breakfast. Alternated with 53mcg tablet. 45 tablet 0  . levothyroxine (SYNTHROID) 75 MCG tablet Take 1 tablet (75 mcg total) by mouth every other day. 30 minutes before breakfast. Alternate with 57mcg tablet. 45 tablet 0  . lidocaine (LIDODERM) 5 % Place 1 patch onto the skin daily. Remove & Discard patch within 12 hours or as directed by MD 30 patch 0  . magnesium hydroxide (MILK OF MAGNESIA) 400 MG/5ML suspension Take 15 mLs by mouth daily as needed for mild constipation or moderate constipation.    . nitroGLYCERIN (NITROSTAT) 0.4 MG SL tablet Place 1 tablet (0.4 mg total) under the tongue every 5 (five) minutes as needed for chest pain. 25 tablet 6  . omeprazole (PRILOSEC) 20 MG capsule Take 1 capsule (20 mg total) by mouth 2 (two) times daily before a meal. 180 capsule 3  . potassium chloride SA (K-DUR) 20 MEQ tablet Take 1 tablet (20 mEq total) by mouth 2 (two) times daily. 180 tablet 0   Current Facility-Administered Medications  Medication Dose Route Frequency Provider Last Rate Last Dose  . cyanocobalamin ((VITAMIN B-12)) injection 1,000 mcg  1,000 mcg Intramuscular Q30 days Pamella Pert, Irma M, MD   1,000 mcg at 05/14/19 1748    REVIEW OF SYSTEMS:  [X]  denotes positive finding, [ ]  denotes negative finding Cardiac  Comments:  Chest pain or chest pressure:    Shortness of breath upon exertion:    Short of breath when lying flat:    Irregular heart rhythm:        Vascular    Pain in calf, thigh, or hip brought on by  ambulation: x   Pain in feet at night that wakes you up from your sleep:  x   Blood clot in your veins:    Leg swelling:  x       Pulmonary    Oxygen at home:    Productive cough:     Wheezing:         Neurologic    Sudden weakness in arms or legs:  Sudden numbness in arms or legs:     Sudden onset of difficulty speaking or slurred speech:    Temporary loss of vision in one eye:     Problems with dizziness:         Gastrointestinal    Blood in stool:     Vomited blood:         Genitourinary    Burning when urinating:     Blood in urine:        Psychiatric    Major depression:         Hematologic    Bleeding problems:    Problems with blood clotting too easily:        Skin    Rashes or ulcers:        Constitutional    Fever or chills:     PHYSICAL EXAM:   Vitals:   06/25/19 1221  BP: 116/66  Pulse: 66  Resp: 16  Temp: (!) 97 F (36.1 C)  TempSrc: Temporal  SpO2: 97%  Weight: 170 lb 1.6 oz (77.2 kg)  Height: 5\' 4"  (1.626 m)    GENERAL: The patient is a well-nourished male, in no acute distress. The vital signs are documented above. CARDIAC: There is a regular rate and rhythm.  VASCULAR: I do not detect carotid bruits. He has palpable dorsalis pedis pulses bilaterally. He has hyperpigmentation bilaterally and a healing ulcer in the right leg  Right leg:     Left leg:    I did look at his right great saphenous vein myself with the SonoSite.  The vein is superficial throughout most of the thigh and is only deep to the fascia in the very proximal thigh.  Thus I do not think he is a good candidate for laser ablation of the right great saphenous vein given its proximity to the skin.  PULMONARY: There is good air exchange bilaterally without wheezing or rales. ABDOMEN: Soft and non-tender with normal pitched bowel sounds.  MUSCULOSKELETAL: There are no major deformities or cyanosis. NEUROLOGIC: No focal weakness or paresthesias are detected. SKIN:  There are no ulcers or rashes noted. PSYCHIATRIC: The patient has a normal affect.  DATA:    ARTERIAL DOPPLER STUDY: I have reviewed the arterial Doppler study that was done 06/08/2019.  On the right side there is a triphasic dorsalis pedis and posterior tibial signal.  ABI could not be obtained as the arteries were calcified.  Toe pressure however was 98 mmHg.  On the left side there was a triphasic dorsalis pedis and posterior tibial signal.  ABI could not be obtained as the arteries were calcified.  Toe pressure was 156 mmHg.  VENOUS DUPLEX: I have reviewed the venous duplex scan of the right lower extremity that was done on 06/17/2019.  There is no evidence of DVT in the right lower extremity.  There is no superficial venous thrombosis.  There is deep venous reflux involving the femoral vein and popliteal vein.  There is superficial venous reflux involving the right great saphenous vein from the saphenofemoral junction to the proximal calf.  The vein is dilated up to 0.64 cm.  There is reflux in the small saphenous vein also however this vein is not significantly dilated.  MEDICAL ISSUES:   CHRONIC VENOUS INSUFFICIENCY: Fortunately he has excellent arterial flow based on his noninvasive studies and palpable dorsalis pedis pulses.  Therefore I have recommended aggressive leg elevation and we have discussed the proper positioning for this.  I recommended we  continue with the Unna boot dressing changes weekly until the wound is completely healed and then will need to try to get him in a compression stocking.  He has a hard time getting on compression stockings so will use probably a 15 to 20 mmHg pressure gradient and see if we can obtain a device to help him get his compression socks on.  I have encouraged him to avoid prolonged sitting and standing.  I did explain to him that based on my evaluation of his right great saphenous vein he did not appear to be a good candidate for laser ablation the  right great saphenous vein.  In addition he is 85 years old with congestive heart failure so certainly we wanted to try conservative measures and avoid any invasive procedures if at all possible.  I will plan on seeing him back in 3 to 4 weeks.  He will come in weekly for Unna boot changes.  Deitra Mayo [facility] [phone]

## 2019-06-28 ENCOUNTER — Other Ambulatory Visit: Payer: Self-pay | Admitting: Family Medicine

## 2019-07-02 ENCOUNTER — Encounter: Payer: Self-pay | Admitting: Family

## 2019-07-02 ENCOUNTER — Other Ambulatory Visit: Payer: Self-pay

## 2019-07-02 ENCOUNTER — Ambulatory Visit (INDEPENDENT_AMBULATORY_CARE_PROVIDER_SITE_OTHER): Payer: PPO | Admitting: *Deleted

## 2019-07-02 VITALS — BP 131/69 | HR 71 | Temp 97.5°F | Resp 20 | Ht 64.0 in | Wt 170.0 lb

## 2019-07-02 DIAGNOSIS — I872 Venous insufficiency (chronic) (peripheral): Secondary | ICD-10-CM

## 2019-07-02 NOTE — Progress Notes (Signed)
Patient presents today for bilateral Unna boot change. Patient tolerated well . Both legs bilaterally healing well improvement from last photos taken. Patient to return in 1 week for next unna boot change.

## 2019-07-05 ENCOUNTER — Other Ambulatory Visit: Payer: Self-pay | Admitting: Family Medicine

## 2019-07-05 DIAGNOSIS — E079 Disorder of thyroid, unspecified: Secondary | ICD-10-CM

## 2019-07-05 DIAGNOSIS — G609 Hereditary and idiopathic neuropathy, unspecified: Secondary | ICD-10-CM

## 2019-07-06 ENCOUNTER — Encounter (HOSPITAL_COMMUNITY): Payer: PPO

## 2019-07-06 ENCOUNTER — Encounter: Payer: PPO | Admitting: Surgery

## 2019-07-09 ENCOUNTER — Other Ambulatory Visit: Payer: Self-pay

## 2019-07-09 ENCOUNTER — Encounter: Payer: Self-pay | Admitting: Family

## 2019-07-09 ENCOUNTER — Ambulatory Visit: Payer: PPO | Admitting: Family

## 2019-07-09 VITALS — BP 115/69 | HR 85 | Temp 97.4°F | Resp 20 | Ht 64.0 in | Wt 170.0 lb

## 2019-07-09 DIAGNOSIS — I872 Venous insufficiency (chronic) (peripheral): Secondary | ICD-10-CM

## 2019-07-09 DIAGNOSIS — L97929 Non-pressure chronic ulcer of unspecified part of left lower leg with unspecified severity: Secondary | ICD-10-CM

## 2019-07-09 DIAGNOSIS — I83019 Varicose veins of right lower extremity with ulcer of unspecified site: Secondary | ICD-10-CM

## 2019-07-09 NOTE — Progress Notes (Signed)
Unna boot medicated compression dressings applied to bilateral lower legs by Etheleen Sia LPN. Patient to return in 1 week for next unna boot change.

## 2019-07-14 ENCOUNTER — Ambulatory Visit: Payer: PPO | Admitting: Family Medicine

## 2019-07-15 DIAGNOSIS — H26492 Other secondary cataract, left eye: Secondary | ICD-10-CM | POA: Diagnosis not present

## 2019-07-16 ENCOUNTER — Encounter: Payer: Self-pay | Admitting: Family

## 2019-07-16 ENCOUNTER — Ambulatory Visit: Payer: PPO | Admitting: Family

## 2019-07-16 ENCOUNTER — Other Ambulatory Visit: Payer: Self-pay

## 2019-07-16 DIAGNOSIS — I83029 Varicose veins of left lower extremity with ulcer of unspecified site: Secondary | ICD-10-CM

## 2019-07-16 DIAGNOSIS — I83019 Varicose veins of right lower extremity with ulcer of unspecified site: Secondary | ICD-10-CM

## 2019-07-23 ENCOUNTER — Other Ambulatory Visit: Payer: Self-pay

## 2019-07-23 ENCOUNTER — Ambulatory Visit (INDEPENDENT_AMBULATORY_CARE_PROVIDER_SITE_OTHER): Payer: PPO | Admitting: Family

## 2019-07-23 ENCOUNTER — Encounter: Payer: Self-pay | Admitting: Family Medicine

## 2019-07-23 ENCOUNTER — Ambulatory Visit (INDEPENDENT_AMBULATORY_CARE_PROVIDER_SITE_OTHER): Payer: PPO | Admitting: Family Medicine

## 2019-07-23 VITALS — BP 102/74 | HR 74 | Temp 97.9°F | Ht 64.0 in | Wt 155.4 lb

## 2019-07-23 DIAGNOSIS — E1129 Type 2 diabetes mellitus with other diabetic kidney complication: Secondary | ICD-10-CM

## 2019-07-23 DIAGNOSIS — E538 Deficiency of other specified B group vitamins: Secondary | ICD-10-CM | POA: Diagnosis not present

## 2019-07-23 DIAGNOSIS — I872 Venous insufficiency (chronic) (peripheral): Secondary | ICD-10-CM

## 2019-07-23 DIAGNOSIS — Z8639 Personal history of other endocrine, nutritional and metabolic disease: Secondary | ICD-10-CM

## 2019-07-23 DIAGNOSIS — R079 Chest pain, unspecified: Secondary | ICD-10-CM | POA: Diagnosis not present

## 2019-07-23 DIAGNOSIS — G609 Hereditary and idiopathic neuropathy, unspecified: Secondary | ICD-10-CM

## 2019-07-23 DIAGNOSIS — E039 Hypothyroidism, unspecified: Secondary | ICD-10-CM | POA: Diagnosis not present

## 2019-07-23 MED ORDER — GABAPENTIN 300 MG PO CAPS: 600.0000 mg | ORAL_CAPSULE | Freq: Three times a day (TID) | ORAL | 0 refills | Status: DC

## 2019-07-23 MED ORDER — FAMOTIDINE 20 MG PO TABS: 20.0000 mg | ORAL_TABLET | Freq: Two times a day (BID) | ORAL | 2 refills | Status: AC

## 2019-07-23 MED ORDER — POTASSIUM CHLORIDE CRYS ER 20 MEQ PO TBCR: 20.0000 meq | EXTENDED_RELEASE_TABLET | Freq: Two times a day (BID) | ORAL | 0 refills | Status: DC

## 2019-07-23 NOTE — Patient Instructions (Signed)
To decrease swelling in your feet and legs: Elevate feet above slightly bent knees, feet above heart, overnight and 3-4 times per day for 20 minutes.    Chronic Venous Insufficiency Chronic venous insufficiency is a condition where the leg veins cannot effectively pump blood from the legs to the heart. This happens when the vein walls are either stretched, weakened, or damaged, or when the valves inside the vein are damaged. With the right treatment, you should be able to continue with an active life. This condition is also called venous stasis. What are the causes? Common causes of this condition include:  High blood pressure inside the veins (venous hypertension).  Sitting or standing too long, causing increased blood pressure in the leg veins.  A blood clot that blocks blood flow in a vein (deep vein thrombosis, DVT).  Inflammation of a vein (phlebitis) that causes a blood clot to form.  Tumors in the pelvis that cause blood to back up. What increases the risk? The following factors may make you more likely to develop this condition:  Having a family history of this condition.  Obesity.  Pregnancy.  Living without enough regular physical activity or exercise (sedentary lifestyle).  Smoking.  Having a job that requires long periods of standing or sitting in one place.  Being a certain age. Women in their 56s and 20s and men in their 16s are more likely to develop this condition. What are the signs or symptoms? Symptoms of this condition include:  Veins that are enlarged, bulging, or twisted (varicose veins).  Skin breakdown or ulcers.  Reddened skin or dark discoloration of skin on the leg between the knee and ankle.  Brown, smooth, tight, and painful skin just above the ankle, usually on the inside of the leg (lipodermatosclerosis).  Swelling of the legs. How is this diagnosed? This condition may be diagnosed based on:  Your medical history.  A physical exam.   Tests, such as: ? A procedure that creates an image of a blood vessel and nearby organs and provides information about blood flow through the blood vessel (duplex ultrasound). ? A procedure that tests blood flow (plethysmography). ? A procedure that looks at the veins using X-ray and dye (venogram). How is this treated? The goals of treatment are to help you return to an active life and to minimize pain or disability. Treatment depends on the severity of your condition, and it may include:  Wearing compression stockings. These can help relieve symptoms and help prevent your condition from getting worse. However, they do not cure the condition.  Sclerotherapy. This procedure involves an injection of a solution that shrinks damaged veins.  Surgery. This may involve: ? Removing a diseased vein (vein stripping). ? Cutting off blood flow through the vein (laser ablation surgery). ? Repairing or reconstructing a valve within the affected vein. Follow these instructions at home:      Wear compression stockings as told by your health care provider. These stockings help to prevent blood clots and reduce swelling in your legs.  Take over-the-counter and prescription medicines only as told by your health care provider.  Stay active by exercising, walking, or doing different activities. Ask your health care provider what activities are safe for you and how much exercise you need.  Drink enough fluid to keep your urine pale yellow.  Do not use any products that contain nicotine or tobacco, such as cigarettes, e-cigarettes, and chewing tobacco. If you need help quitting, ask your health care provider.  Keep all follow-up visits as told by your health care provider. This is important. Contact a health care provider if you:  Have redness, swelling, or more pain in the affected area.  See a red streak or line that goes up or down from the affected area.  Have skin breakdown or skin loss in the  affected area, even if the breakdown is small.  Get an injury in the affected area. Get help right away if:  You get an injury and an open wound in the affected area.  You have: ? Severe pain that does not get better with medicine. ? Sudden numbness or weakness in the foot or ankle below the affected area. ? Trouble moving your foot or ankle. ? A fever. ? Worse or persistent symptoms. ? Chest pain. ? Shortness of breath. Summary  Chronic venous insufficiency is a condition where the leg veins cannot effectively pump blood from the legs to the heart.  Chronic venous insufficiency occurs when the vein walls become stretched, weakened, or damaged, or when valves within the vein are damaged.  Treatment depends on how severe your condition is. It often involves wearing compression stockings and may involve having a procedure.  Make sure you stay active by exercising, walking, or doing different activities. Ask your health care provider what activities are safe for you and how much exercise you need. This information is not intended to replace advice given to you by your health care provider. Make sure you discuss any questions you have with your health care provider. Document Released: 12/24/2006 Document Revised: 05/13/2018 Document Reviewed: 05/13/2018 Elsevier Patient Education  2020 [employer].

## 2019-07-23 NOTE — Progress Notes (Signed)
11/19/20204:33 PM  [name] Elliott 1928/02/08, 85 y.o., male [id number]  Chief Complaint  Patient presents with  . Follow-up    having really bad heartburn since the wkn, hursts when he belches and does not want to eat due to the pain. Says he is having pain all over    HPI:   Patient is a 85 y.o. male with past medical history significant for CAD, CHF, afib, DM2, CKD3, osteoporosis, b12 deficiency, hypothyroidism, GERD, dementia, chronic venous insuffiency who presents today for worsening gerd/chest pain  For past several days he has not been feeling well Having lots of reflux, belching, pain, not wanting to eat Has been taking omeprazole but nothing else  Pain is constant, milder today, worse with walking No vomiting, no diarrhea, no constipation, no black tarry stools intermittent SOB and palpitations No dizziness or lightheaded Daughter thought he should go to ER but he refused    Depression screen [facility] 2/9 07/23/2019 05/15/2019 05/14/2019  Decreased Interest 0 0 0  Down, Depressed, Hopeless 0 0 0  PHQ - 2 Score 0 0 0  Some recent data might be hidden    Fall Risk  07/23/2019 05/15/2019 05/14/2019 04/13/2019 01/21/2019  Falls in the past year? 0 0 0 0 1  Number falls in past yr: 0 0 0 0 0  Injury with Fall? 0 0 0 0 1  Comment - - - - -  Follow up - Falls evaluation completed;Education provided;Falls prevention discussed - - -     Allergies  Allergen Reactions  . Crestor [Rosuvastatin Calcium] Other (See Comments)    Muscle pain/aches  . Mestinon [Pyridostigmine Bromide Er] Other (See Comments)    GI upset, stomach cramps    Prior to Admission medications   Medication Sig Start Date End Date Taking? Authorizing Provider  aspirin EC 81 MG tablet Take 81 mg by mouth daily.    Yes [provider]  bismuth subsalicylate (PEPTO BISMOL) 262 MG/15ML suspension Take 30 mLs by mouth daily as needed for indigestion or diarrhea or loose stools.    Yes [provider]  diclofenac sodium (VOLTAREN) 1 % GEL Apply 2 g topically 4 (four) times daily. 08/04/18  Yes Rutherford Guys, MD  docusate sodium (COLACE) 100 MG capsule Take 1 capsule (100 mg total) by mouth every 12 (twelve) hours. 07/29/18  Yes Harris, Abigail, PA-C  furosemide (LASIX) 40 MG tablet Take 1 tablet (40 mg total) by mouth daily. 07/03/18  Yes Nahser, Wonda Cheng, MD  gabapentin (NEURONTIN) 300 MG capsule TAKE 2 CAPSULES BY MOUTH THREE TIMES DAILY 07/06/19  Yes Delia Chimes A, MD  isosorbide mononitrate (IMDUR) 30 MG 24 hr tablet Take 1 tablet (30 mg total) by mouth daily. 10/02/18  Yes Nahser, Wonda Cheng, MD  levothyroxine (SYNTHROID) 50 MCG tablet TAKE 1 TABLET BY MOUTH EVERY OTHER DAY 30  MINUTES  BEFORE  BREAKFAST ALTERNATING WITH 75MCG TABLET 06/29/19  Yes Rutherford Guys, MD  levothyroxine (SYNTHROID) 75 MCG tablet TAKE 1 TABLET BY MOUTH EVERY OTHER DAY 30  MINUTES BEFORE BREAKFAST. ALTERNATING WITH 50MCG TABLET 07/06/19  Yes Rutherford Guys, MD  lidocaine (LIDODERM) 5 % Place 1 patch onto the skin daily. Remove & Discard patch within 12 hours or as directed by MD 09/04/18  Yes Rutherford Guys, MD  magnesium hydroxide (MILK OF MAGNESIA) 400 MG/5ML suspension Take 15 mLs by mouth daily as needed for mild constipation or moderate constipation.   Yes [provider]  nitroGLYCERIN (  NITROSTAT) 0.4 MG SL tablet Place 1 tablet (0.4 mg total) under the tongue every 5 (five) minutes as needed for chest pain. 07/03/18  Yes Nahser, Wonda Cheng, MD  omeprazole (PRILOSEC) 20 MG capsule Take 1 capsule (20 mg total) by mouth 2 (two) times daily before a meal. 07/01/18  Yes McVey, Gelene Mink, PA-C  potassium chloride SA (K-DUR) 20 MEQ tablet Take 1 tablet (20 mEq total) by mouth 2 (two) times daily. 03/25/19  Yes Corum, Rex Kras, MD    Past Medical History:  Diagnosis Date  . Arthritis   . Arthropathy, unspecified, site unspecified   . Atrial fibrillation ([place])   . Blood dyscrasia     thromboctopenia pt family states he was never told of this  . CAD (coronary artery disease)    last cath in 2012. Managed medically-some blockages  . Chronic lower back pain   . Chronic systolic CHF (congestive heart failure) ([place]) 06/04/2016  . Constipation, chronic   . Diabetes mellitus without complication ([place])    pt states he doesn't have  . DVT (deep venous thrombosis) ([place])   . Dysrhythmia   . Esophageal reflux   . 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 MRI Nov '11 - tendonosis and partial tear. Left shoulder surgery Jan '12 for partial shoulder replacement. [name])   . Headache(784.0)   . History of stomach ulcers 11/2012  . Hypothyroidism    pt states he doeen't have   . Orthostasis   . Other B-complex deficiencies   . Other malaise and fatigue   . Pain in joint, shoulder region    Has chronic shoulder pain  . Pain in limb   . Persistent disorder of initiating or maintaining sleep   . Pneumonia 1980's?; 2011  . Primary localized osteoarthritis of left knee 07/12/2015  . Pure hypercholesterolemia   . PVC's (premature ventricular contractions)   . S/P cardiac cath 08/08/11   mild to moderate CAD primarily in the LAD. None are obstructive and appear stable from prior cath in 2007; managed medically  . Sebaceous cyst   . Stroke ([place])   . Thrombocytopenia, unspecified ([place])   . Unspecified essential hypertension   . Vascular dementia without behavioral disturbance (HCC)    with periods of amnesia    Past Surgical History:  Procedure Laterality Date  . BACK SURGERY    . CARDIAC CATHETERIZATION  08/2011  . CATARACT EXTRACTION Left 2009  . COLONOSCOPY    . ESOPHAGOGASTRODUODENOSCOPY N/A 11/19/2012   Procedure: ESOPHAGOGASTRODUODENOSCOPY (EGD);  Surgeon: Jerene Bears, MD;  Location: [place];  Service: Gastroenterology;  Laterality: N/A;  . FINGER AMPUTATION Right    pinky finger  . HAND SURGERY Right    crush injury, right fifth digit  contracture, limited  motion  . HARDWARE REMOVAL  02/05/2012   Procedure: HARDWARE REMOVAL;  Surgeon: Johnny Bridge, MD;  Location: [place];  Service: Orthopedics;  Laterality: Left;  . KNEE SURGERY Left 1991   "did it twice in 1 wk" (02/17/2013)  . LUMBAR SPINE SURGERY  2007   Dr Philip Aspen ([place])  . REVERSE SHOULDER ARTHROPLASTY  02/05/2012   Procedure: REVERSE SHOULDER ARTHROPLASTY;  Surgeon: Johnny Bridge, MD;  Location: [place];  Service: Orthopedics;  Laterality: Left;  . SHOULDER HEMI-ARTHROPLASTY    . SHOULDER OPEN ROTATOR CUFF REPAIR Left 8.30.2011  . TOTAL KNEE ARTHROPLASTY Left 07/12/2015   Procedure: TOTAL KNEE ARTHROPLASTY;  Surgeon: Marchia Bond, MD;  Location: [place];  Service:  Orthopedics;  Laterality: Left;  . US ECHOCARDIOGRAPHY  02-28-2010   Est EF 50-55%    Social History   Tobacco Use  . Smoking status: Former Smoker    Types: Cigars    Quit date: 09/03/1978    Years since quitting: 40.9  . Smokeless tobacco: Current User    Types: Chew  . Tobacco comment: 02/17/2013 "I aien't smoked a cigar in 20-30 yr"  Substance Use Topics  . Alcohol use: No    Alcohol/week: 0.0 standard drinks    Family History  Problem Relation Age of Onset  . Breast cancer Mother   . Cancer Mother   . Diabetes Mother   . Cancer Brother        throat  . Stroke Brother   . Kidney disease Father   . Diabetes Sister   . Diabetes Daughter   . Colon cancer Neg Hx   . Heart attack Neg Hx     ROS Per hpi  OBJECTIVE:  Today's Vitals   07/23/19 1622  BP: 102/74  Pulse: 74  Temp: 97.9 F (36.6 C)  SpO2: 97%  Weight: 155 lb 6.4 oz (70.5 kg)  Height: 5\' 4"  (1.626 m)   Body mass index is 26.67 kg/m.  Wt Readings from Last 3 Encounters:  07/23/19 155 lb 6.4 oz (70.5 kg)  07/09/19 170 lb (77.1 kg)  07/02/19 170 lb (77.1 kg)   BP Readings from Last 3 Encounters:  07/23/19 102/74  07/09/19 115/69  07/02/19 131/69    Physical Exam Vitals signs and nursing note reviewed.   Constitutional:      Appearance: He is well-developed.  HENT:     Head: Normocephalic and atraumatic.  Eyes:     Conjunctiva/sclera: Conjunctivae normal.     Pupils: Pupils are equal, round, and reactive to light.  Neck:     Musculoskeletal: Neck supple.  Cardiovascular:     Rate and Rhythm: Normal rate and regular rhythm.     Heart sounds: No murmur. No friction rub. No gallop.   Pulmonary:     Effort: Pulmonary effort is normal.     Breath sounds: Normal breath sounds. No wheezing, rhonchi or rales.  Chest:     Chest wall: Tenderness present.  Abdominal:     General: Bowel sounds are normal.     Palpations: Abdomen is soft. There is no hepatomegaly, splenomegaly or mass.     Tenderness: There is generalized abdominal tenderness. There is no guarding or rebound.  Skin:    General: Skin is warm and dry.  Neurological:     Mental Status: He is alert and oriented to person, place, and time.   uses a cane  My interpretation of EKG:  Sinus, HR 76, PVCs, low voltage in precordial leads, nonspecific t wave abnormalities, incomplete RBBB. No sign changes compared to 2019  No results found for this or any previous visit (from the past 24 hour(s)).  No results found.   ASSESSMENT and PLAN  1. Chest pain, unspecified type Discussed cant r/o unstable angina/nstemi. Patient does not want to go to ER, understands r/b. Discussed trial of escalating gerd treatment, adding H2B, use of mylanta prn. RTC precautions reviewed - EKG 12-Lead  2. Controlled type 2 diabetes mellitus with other diabetic kidney complication, without long-term current use of insulin (HCC) - Microalbumin / creatinine urine ratio - Hemoglobin A1c - Comprehensive metabolic panel - CBC  3. Hypothyroidism, unspecified type - TSH  4. Vitamin B12 deficiency - Vitamin B12  5. Hereditary and idiopathic peripheral neuropathy - gabapentin (NEURONTIN) 300 MG capsule; Take 2 capsules (600 mg total) by mouth 3 (three)  times daily.  6. History of low potassium - potassium chloride SA (KLOR-CON) 20 MEQ tablet; Take 1 tablet (20 mEq total) by mouth 2 (two) times daily.  Other orders - famotidine (PEPCID) 20 MG tablet; Take 1 tablet (20 mg total) by mouth 2 (two) times daily.  Return in about 3 months (around 10/23/2019).    Rutherford Guys, MD [facility] at [facility] [place], [place] [address] Ph.  [phone] Fax [phone]

## 2019-07-23 NOTE — Progress Notes (Addendum)
CC: follow up unna boot application, Chronic Venous Insufficiency  History of Present Illness  [name] Elliott is a 85 y.o. (1927-12-26) male who presents with chief complaint:re application of unna boot. This is about visit number 5 for this.  He has no more open wounds, no erythema at his lower legs. There is trace pitting edema at his ankles.   Past Medical History:  Diagnosis Date  . Arthritis   . Arthropathy, unspecified, site unspecified   . Atrial fibrillation ([place])   . Blood dyscrasia    thromboctopenia pt family states he was never told of this  . CAD (coronary artery disease)    last cath in 2012. Managed medically-some blockages  . Chronic lower back pain   . Chronic systolic CHF (congestive heart failure) ([place]) 06/04/2016  . Constipation, chronic   . Diabetes mellitus without complication ([place])    pt states he doesn't have  . DVT (deep venous thrombosis) ([place])   . Dysrhythmia   . Esophageal reflux   . 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 MRI Nov '11 - tendonosis and partial tear. Left shoulder surgery Jan '12 for partial shoulder replacement. [name])   . Headache(784.0)   . History of stomach ulcers 11/2012  . Hypothyroidism    pt states he doeen't have   . Orthostasis   . Other B-complex deficiencies   . Other malaise and fatigue   . Pain in joint, shoulder region    Has chronic shoulder pain  . Pain in limb   . Persistent disorder of initiating or maintaining sleep   . Pneumonia 1980's?; 2011  . Primary localized osteoarthritis of left knee 07/12/2015  . Pure hypercholesterolemia   . PVC's (premature ventricular contractions)   . S/P cardiac cath 08/08/11   mild to moderate CAD primarily in the LAD. None are obstructive and appear stable from prior cath in 2007; managed medically  . Sebaceous cyst   . Stroke ([place])   . Thrombocytopenia, unspecified ([place])   . Unspecified essential hypertension   . Vascular dementia without  behavioral disturbance (HCC)    with periods of amnesia    Social History Social History   Tobacco Use  . Smoking status: Former Smoker    Types: Cigars    Quit date: 09/03/1978    Years since quitting: 40.9  . Smokeless tobacco: Current User    Types: Chew  . Tobacco comment: 02/17/2013 "I aien't smoked a cigar in 20-30 yr"  Substance Use Topics  . Alcohol use: No    Alcohol/week: 0.0 standard drinks  . Drug use: No    Family History Family History  Problem Relation Age of Onset  . Breast cancer Mother   . Cancer Mother   . Diabetes Mother   . Cancer Brother        throat  . Stroke Brother   . Kidney disease Father   . Diabetes Sister   . Diabetes Daughter   . Colon cancer Neg Hx   . Heart attack Neg Hx     Surgical History Past Surgical History:  Procedure Laterality Date  . BACK SURGERY    . CARDIAC CATHETERIZATION  08/2011  . CATARACT EXTRACTION Left 2009  . COLONOSCOPY    . ESOPHAGOGASTRODUODENOSCOPY N/A 11/19/2012   Procedure: ESOPHAGOGASTRODUODENOSCOPY (EGD);  Surgeon: Jerene Bears, MD;  Location: [place];  Service: Gastroenterology;  Laterality: N/A;  . FINGER AMPUTATION Right    pinky finger  . HAND SURGERY Right  crush injury, right fifth digit contracture, limited  motion  . HARDWARE REMOVAL  02/05/2012   Procedure: HARDWARE REMOVAL;  Surgeon: Johnny Bridge, MD;  Location: [place];  Service: Orthopedics;  Laterality: Left;  . KNEE SURGERY Left 1991   "did it twice in 1 wk" (02/17/2013)  . LUMBAR SPINE SURGERY  2007   Dr Philip Aspen ([place])  . REVERSE SHOULDER ARTHROPLASTY  02/05/2012   Procedure: REVERSE SHOULDER ARTHROPLASTY;  Surgeon: Johnny Bridge, MD;  Location: [place];  Service: Orthopedics;  Laterality: Left;  . SHOULDER HEMI-ARTHROPLASTY    . SHOULDER OPEN ROTATOR CUFF REPAIR Left 8.30.2011  . TOTAL KNEE ARTHROPLASTY Left 07/12/2015   Procedure: TOTAL KNEE ARTHROPLASTY;  Surgeon: Marchia Bond, MD;  Location: [facility];  Service:  Orthopedics;  Laterality: Left;  . US ECHOCARDIOGRAPHY  02-28-2010   Est EF 50-55%    Allergies  Allergen Reactions  . Crestor [Rosuvastatin Calcium] Other (See Comments)    Muscle pain/aches  . Mestinon [Pyridostigmine Bromide Er] Other (See Comments)    GI upset, stomach cramps    Current Outpatient Medications  Medication Sig Dispense Refill  . aspirin EC 81 MG tablet Take 81 mg by mouth daily.     Marland Kitchen bismuth subsalicylate (PEPTO BISMOL) 262 MG/15ML suspension Take 30 mLs by mouth daily as needed for indigestion or diarrhea or loose stools.     . diclofenac sodium (VOLTAREN) 1 % GEL Apply 2 g topically 4 (four) times daily. 100 g 0  . docusate sodium (COLACE) 100 MG capsule Take 1 capsule (100 mg total) by mouth every 12 (twelve) hours. 30 capsule 0  . furosemide (LASIX) 40 MG tablet Take 1 tablet (40 mg total) by mouth daily. 90 tablet 3  . gabapentin (NEURONTIN) 300 MG capsule TAKE 2 CAPSULES BY MOUTH THREE TIMES DAILY 540 capsule 0  . isosorbide mononitrate (IMDUR) 30 MG 24 hr tablet Take 1 tablet (30 mg total) by mouth daily. 90 tablet 3  . levothyroxine (SYNTHROID) 50 MCG tablet TAKE 1 TABLET BY MOUTH EVERY OTHER DAY 30  MINUTES  BEFORE  BREAKFAST ALTERNATING WITH 75MCG TABLET 45 tablet 0  . levothyroxine (SYNTHROID) 75 MCG tablet TAKE 1 TABLET BY MOUTH EVERY OTHER DAY 30  MINUTES BEFORE BREAKFAST. ALTERNATING WITH 50MCG TABLET 45 tablet 0  . lidocaine (LIDODERM) 5 % Place 1 patch onto the skin daily. Remove & Discard patch within 12 hours or as directed by MD 30 patch 0  . magnesium hydroxide (MILK OF MAGNESIA) 400 MG/5ML suspension Take 15 mLs by mouth daily as needed for mild constipation or moderate constipation.    . nitroGLYCERIN (NITROSTAT) 0.4 MG SL tablet Place 1 tablet (0.4 mg total) under the tongue every 5 (five) minutes as needed for chest pain. 25 tablet 6  . omeprazole (PRILOSEC) 20 MG capsule Take 1 capsule (20 mg total) by mouth 2 (two) times daily before a meal. 180  capsule 3  . potassium chloride SA (K-DUR) 20 MEQ tablet Take 1 tablet (20 mEq total) by mouth 2 (two) times daily. 180 tablet 0   Current Facility-Administered Medications  Medication Dose Route Frequency Provider Last Rate Last Dose  . cyanocobalamin ((VITAMIN B-12)) injection 1,000 mcg  1,000 mcg Intramuscular Q30 days Rutherford Guys, MD   1,000 mcg at 05/14/19 1748    Physical Examination  There were no vitals filed for this visit. There is no height or weight on file to calculate BMI.    Medical Decision Making  [name] Elliott is a 85 y.o. male who presents with: totally haled venous stasis wounds in his lower legs, no cellulitis, trace pitting edema at his ankles. Unna boots were removed. Lower legs were washed, dried; then kerlex light wrap applied to both lower legs, and ace wraps applied with mild compression. Daughter instructed how to apply the above, and to re-wrap daily, as the wrap will shift. He has knee high compression hose at home. If daughter prefers, pt may apply regular knee high socks to protect his skin, then knee high compression hose over the socks. Patient and daughter instructed how to prevent or minimize dependent edema in his lower legs, by lower extremity elevation when pt is not walking, and when asleep; and also compression. I advised pt and daughter to let us know if they develop concerns re his legs, and if he develops any wounds.  Follow up with Dr. Scot Dock as scheduled on 08-05-19.    [name] Chambers, RN, MSN, FNP-C [facility] Office: [phone]  07/23/2019, 2:16 PM  Clinic MD: Laqueta Due

## 2019-07-24 LAB — COMPREHENSIVE METABOLIC PANEL
ALT: 15 IU/L (ref 0–44)
AST: 20 IU/L (ref 0–40)
Albumin/Globulin Ratio: 1.3 (ref 1.2–2.2)
Albumin: 4 g/dL (ref 3.6–4.6)
Alkaline Phosphatase: 47 IU/L (ref 39–117)
BUN/Creatinine Ratio: 18 (ref 10–24)
BUN: 30 mg/dL — ABNORMAL HIGH (ref 8–27)
Bilirubin Total: 1.9 mg/dL — ABNORMAL HIGH (ref 0.0–1.2)
CO2: 16 mmol/L — ABNORMAL LOW (ref 20–29)
Calcium: 9.1 mg/dL (ref 8.6–10.2)
Chloride: 95 mmol/L — ABNORMAL LOW (ref 96–106)
Creatinine, Ser: 1.64 mg/dL — ABNORMAL HIGH (ref 0.76–1.27)
GFR calc Af Amer: 42 mL/min/{1.73_m2} — ABNORMAL LOW (ref >59)
GFR calc non Af Amer: 37 mL/min/{1.73_m2} — ABNORMAL LOW (ref >59)
Globulin, Total: 3.1 g/dL (ref 1.5–4.5)
Glucose: 111 mg/dL — ABNORMAL HIGH (ref 65–99)
Potassium: 4.2 mmol/L (ref 3.5–5.2)
Sodium: 133 mmol/L — ABNORMAL LOW (ref 134–144)
Total Protein: 7.1 g/dL (ref 6.0–8.5)

## 2019-07-24 LAB — CBC
Hematocrit: 45.7 % (ref 37.5–51.0)
Hemoglobin: 15.5 g/dL (ref 13.0–17.7)
MCH: 29.4 pg (ref 26.6–33.0)
MCHC: 33.9 g/dL (ref 31.5–35.7)
MCV: 87 fL (ref 79–97)
Platelets: 206 10*3/uL (ref 150–450)
RBC: 5.27 x10E6/uL (ref 4.14–5.80)
RDW: 13.3 % (ref 11.6–15.4)
WBC: 8.2 10*3/uL (ref 3.4–10.8)

## 2019-07-24 LAB — TSH: TSH: 5.3 u[IU]/mL — ABNORMAL HIGH (ref 0.450–4.500)

## 2019-07-24 LAB — VITAMIN B12: Vitamin B-12: 1952 pg/mL — ABNORMAL HIGH (ref 232–1245)

## 2019-07-24 LAB — HEMOGLOBIN A1C
Est. average glucose Bld gHb Est-mCnc: 151 mg/dL
Hgb A1c MFr Bld: 6.9 % — ABNORMAL HIGH (ref 4.8–5.6)

## 2019-07-27 ENCOUNTER — Other Ambulatory Visit: Payer: Self-pay | Admitting: Family Medicine

## 2019-07-27 ENCOUNTER — Other Ambulatory Visit: Payer: Self-pay

## 2019-07-27 ENCOUNTER — Telehealth: Payer: Self-pay

## 2019-07-27 DIAGNOSIS — E039 Hypothyroidism, unspecified: Secondary | ICD-10-CM

## 2019-07-27 DIAGNOSIS — E079 Disorder of thyroid, unspecified: Secondary | ICD-10-CM

## 2019-07-27 MED ORDER — LEVOTHYROXINE SODIUM 75 MCG PO TABS: ORAL_TABLET | ORAL | 0 refills | Status: AC

## 2019-07-27 MED ORDER — LEVOTHYROXINE SODIUM 50 MCG PO TABS: ORAL_TABLET | ORAL | 0 refills | Status: AC

## 2019-07-27 NOTE — Progress Notes (Signed)
Spoke with daughter regarding tsh lab not pcp gave, pt has been scheduled for nv for that lab rechk. Message has been sent to pcp to confirm which tsh labs to draw for future order. Daughter will be bringing father a few days before the appt.

## 2019-07-28 ENCOUNTER — Telehealth: Payer: Self-pay | Admitting: Family Medicine

## 2019-07-28 NOTE — Telephone Encounter (Signed)
Please advise 

## 2019-07-28 NOTE — Telephone Encounter (Signed)
Please call and give verbal ok. thanks

## 2019-07-28 NOTE — Telephone Encounter (Signed)
[name] Elliott, with [facility] pharmacy, calling to inquire if the medication Levothyroxine can be switched to manufacturer alivagen brand, as the previous manufacturer is out of stock. She is requesting a call back with verbal ok.

## 2019-08-03 ENCOUNTER — Other Ambulatory Visit: Payer: Self-pay

## 2019-08-03 ENCOUNTER — Emergency Department (HOSPITAL_COMMUNITY)
Admission: EM | Admit: 2019-08-03 | Discharge: 2019-08-03 | Disposition: A | Discharge status: Home / Self Care | Payer: PPO | Attending: Emergency Medicine | Admitting: Emergency Medicine

## 2019-08-03 ENCOUNTER — Emergency Department (HOSPITAL_COMMUNITY): Payer: PPO

## 2019-08-03 DIAGNOSIS — I2510 Atherosclerotic heart disease of native coronary artery without angina pectoris: Secondary | ICD-10-CM | POA: Insufficient documentation

## 2019-08-03 DIAGNOSIS — K228 Other specified diseases of esophagus: Secondary | ICD-10-CM | POA: Diagnosis not present

## 2019-08-03 DIAGNOSIS — E039 Hypothyroidism, unspecified: Secondary | ICD-10-CM | POA: Diagnosis not present

## 2019-08-03 DIAGNOSIS — E1122 Type 2 diabetes mellitus with diabetic chronic kidney disease: Secondary | ICD-10-CM | POA: Insufficient documentation

## 2019-08-03 DIAGNOSIS — Z87891 Personal history of nicotine dependence: Secondary | ICD-10-CM | POA: Diagnosis not present

## 2019-08-03 DIAGNOSIS — R079 Chest pain, unspecified: Secondary | ICD-10-CM

## 2019-08-03 DIAGNOSIS — I5042 Chronic combined systolic (congestive) and diastolic (congestive) heart failure: Secondary | ICD-10-CM | POA: Diagnosis not present

## 2019-08-03 DIAGNOSIS — R05 Cough: Secondary | ICD-10-CM | POA: Diagnosis not present

## 2019-08-03 DIAGNOSIS — K2289 Other specified disease of esophagus: Secondary | ICD-10-CM

## 2019-08-03 DIAGNOSIS — Z7982 Long term (current) use of aspirin: Secondary | ICD-10-CM | POA: Insufficient documentation

## 2019-08-03 DIAGNOSIS — R109 Unspecified abdominal pain: Secondary | ICD-10-CM | POA: Diagnosis not present

## 2019-08-03 DIAGNOSIS — I130 Hypertensive heart and chronic kidney disease with heart failure and stage 1 through stage 4 chronic kidney disease, or unspecified chronic kidney disease: Secondary | ICD-10-CM | POA: Insufficient documentation

## 2019-08-03 DIAGNOSIS — R0789 Other chest pain: Secondary | ICD-10-CM | POA: Diagnosis not present

## 2019-08-03 DIAGNOSIS — K209 Esophagitis, unspecified without bleeding: Secondary | ICD-10-CM | POA: Diagnosis not present

## 2019-08-03 DIAGNOSIS — Z20828 Contact with and (suspected) exposure to other viral communicable diseases: Secondary | ICD-10-CM | POA: Diagnosis not present

## 2019-08-03 DIAGNOSIS — N183 Chronic kidney disease, stage 3 unspecified: Secondary | ICD-10-CM | POA: Insufficient documentation

## 2019-08-03 DIAGNOSIS — Z79899 Other long term (current) drug therapy: Secondary | ICD-10-CM | POA: Diagnosis not present

## 2019-08-03 DIAGNOSIS — Z20822 Contact with and (suspected) exposure to covid-19: Secondary | ICD-10-CM

## 2019-08-03 LAB — HEPATIC FUNCTION PANEL
ALT: 12 U/L (ref 0–44)
AST: 20 U/L (ref 15–41)
Albumin: 3.1 g/dL — ABNORMAL LOW (ref 3.5–5.0)
Alkaline Phosphatase: 42 U/L (ref 38–126)
Bilirubin, Direct: 0.5 mg/dL — ABNORMAL HIGH (ref 0.0–0.2)
Indirect Bilirubin: 1.1 mg/dL — ABNORMAL HIGH (ref 0.3–0.9)
Total Bilirubin: 1.6 mg/dL — ABNORMAL HIGH (ref 0.3–1.2)
Total Protein: 7.2 g/dL (ref 6.5–8.1)

## 2019-08-03 LAB — CBC
HCT: 48.8 % (ref 39.0–52.0)
Hemoglobin: 15.5 g/dL (ref 13.0–17.0)
MCH: 29 pg (ref 26.0–34.0)
MCHC: 31.8 g/dL (ref 30.0–36.0)
MCV: 91.4 fL (ref 80.0–100.0)
Platelets: 252 10*3/uL (ref 150–400)
RBC: 5.34 MIL/uL (ref 4.22–5.81)
RDW: 14.2 % (ref 11.5–15.5)
WBC: 8.2 10*3/uL (ref 4.0–10.5)
nRBC: 0 % (ref 0.0–0.2)

## 2019-08-03 LAB — URINALYSIS, ROUTINE W REFLEX MICROSCOPIC
Bacteria, UA: NONE SEEN
Bilirubin Urine: NEGATIVE
Glucose, UA: 50 mg/dL — AB
Ketones, ur: 20 mg/dL — AB
Leukocytes,Ua: NEGATIVE
Nitrite: NEGATIVE
Protein, ur: 30 mg/dL — AB
Specific Gravity, Urine: 1.034 — ABNORMAL HIGH (ref 1.005–1.030)
pH: 5 (ref 5.0–8.0)

## 2019-08-03 LAB — LIPASE, BLOOD: Lipase: 16 U/L (ref 11–51)

## 2019-08-03 LAB — BASIC METABOLIC PANEL
Anion gap: 17 — ABNORMAL HIGH (ref 5–15)
BUN: 17 mg/dL (ref 8–23)
CO2: 21 mmol/L — ABNORMAL LOW (ref 22–32)
Calcium: 9 mg/dL (ref 8.9–10.3)
Chloride: 96 mmol/L — ABNORMAL LOW (ref 98–111)
Creatinine, Ser: 1.61 mg/dL — ABNORMAL HIGH (ref 0.61–1.24)
GFR calc Af Amer: 43 mL/min — ABNORMAL LOW (ref >60)
GFR calc non Af Amer: 37 mL/min — ABNORMAL LOW (ref >60)
Glucose, Bld: 143 mg/dL — ABNORMAL HIGH (ref 70–99)
Potassium: 4.2 mmol/L (ref 3.5–5.1)
Sodium: 134 mmol/L — ABNORMAL LOW (ref 135–145)

## 2019-08-03 LAB — SARS CORONAVIRUS 2 (TAT 6-24 HRS): SARS Coronavirus 2: NEGATIVE

## 2019-08-03 LAB — TROPONIN I (HIGH SENSITIVITY)
Troponin I (High Sensitivity): 17 ng/L (ref <18)
Troponin I (High Sensitivity): 16 ng/L (ref <18)

## 2019-08-03 MED ORDER — LIDOCAINE VISCOUS HCL 2 % MT SOLN
15.0000 mL | Freq: Once | OROMUCOSAL | Status: AC
  Administered 2019-08-03: 15 mL via ORAL
  Filled 2019-08-03: qty 15

## 2019-08-03 MED ORDER — PANTOPRAZOLE SODIUM 20 MG PO TBEC: 40.0000 mg | DELAYED_RELEASE_TABLET | Freq: Two times a day (BID) | ORAL | 0 refills | Status: DC

## 2019-08-03 MED ORDER — ALUM & MAG HYDROXIDE-SIMETH 200-200-20 MG/5ML PO SUSP
30.0000 mL | Freq: Once | ORAL | Status: AC
  Administered 2019-08-03: 30 mL via ORAL
  Filled 2019-08-03: qty 30

## 2019-08-03 MED ORDER — SODIUM CHLORIDE 0.9% FLUSH: 3.0000 mL | Freq: Once | INTRAVENOUS | Status: DC

## 2019-08-03 MED ORDER — IOHEXOL 300 MG/ML  SOLN
80.0000 mL | Freq: Once | INTRAMUSCULAR | Status: AC | PRN
  Administered 2019-08-03: 80 mL via INTRAVENOUS

## 2019-08-03 NOTE — ED Notes (Signed)
Discharge instructions discussed with pt and pt's daughter. No questions this time. Pt to follow up with PCP and gastroenterology.

## 2019-08-03 NOTE — ED Notes (Signed)
Patient transported to CT 

## 2019-08-03 NOTE — ED Triage Notes (Signed)
Pt reports substernal chest pain, cough. Poor historian.

## 2019-08-03 NOTE — ED Provider Notes (Addendum)
[facility] EMERGENCY DEPARTMENT Provider Note   CSN: [id number] Arrival date & time: 08/03/19  1442     History   Chief Complaint Chief Complaint  Patient presents with  . Chest Pain  . Cough    HPI [name] Elliott is a 85 y.o. male.     Pt complains of pain in his upper abdominal area.  Pt reports he has had pain for months.  Pt is a limited historian.  I spoke with patient's daughter [name] who advised that patient has had pain for the past 2 weeks.  She states that she had a positive Covid exposure and is concerned that patient could have Covid or that he has something wrong with his heart.  Patient has been eating and drinking normally patient has had a cough his daughter  The history is provided by the patient. No language interpreter was used.  Cough Associated symptoms: chest pain     Past Medical History:  Diagnosis Date  . Arthritis   . Arthropathy, unspecified, site unspecified   . Atrial fibrillation ([place])   . Blood dyscrasia    thromboctopenia pt family states he was never told of this  . CAD (coronary artery disease)    last cath in 2012. Managed medically-some blockages  . Chronic lower back pain   . Chronic systolic CHF (congestive heart failure) ([place]) 06/04/2016  . Constipation, chronic   . Diabetes mellitus without complication ([place])    pt states he doesn't have  . DVT (deep venous thrombosis) ([place])   . Dysrhythmia   . Esophageal reflux   . 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 MRI Nov '11 - tendonosis and partial tear. Left shoulder surgery Jan '12 for partial shoulder replacement. [name])   . Headache(784.0)   . History of stomach ulcers 11/2012  . Hypothyroidism    pt states he doeen't have   . Orthostasis   . Other B-complex deficiencies   . Other malaise and fatigue   . Pain in joint, shoulder region    Has chronic shoulder pain  . Pain in limb   . Persistent disorder of initiating or  maintaining sleep   . Pneumonia 1980's?; 2011  . Primary localized osteoarthritis of left knee 07/12/2015  . Pure hypercholesterolemia   . PVC's (premature ventricular contractions)   . S/P cardiac cath 08/08/11   mild to moderate CAD primarily in the LAD. None are obstructive and appear stable from prior cath in 2007; managed medically  . Sebaceous cyst   . Stroke ([place])   . Thrombocytopenia, unspecified ([place])   . Unspecified essential hypertension   . Vascular dementia without behavioral disturbance ([place])    with periods of amnesia    Patient Active Problem List   Diagnosis Date Noted  . Chronic combined systolic and diastolic CHF (congestive heart failure) ([place]) 03/30/2019  . Cellulitis, leg 03/29/2019  . Memory loss 12/05/2017  . Orthostatic dizziness 12/05/2017  . Idiopathic peripheral neuropathy 12/05/2017  . PAF (paroxysmal atrial fibrillation) ([place]) 04/30/2017  . Shortness of breath 06/04/2016  . Chronic systolic CHF (congestive heart failure) ([place]) 06/04/2016  . Bilateral leg edema 12/12/2015  . Dizziness 09/27/2015  . Primary localized osteoarthritis of left knee 07/12/2015  . Weakness 02/23/2015  . DVT (deep venous thrombosis) ([place]) 12/14/2014  . Combined congestive systolic and diastolic heart failure ([place]) 12/14/2014  . Cerebral infarction due to unspecified mechanism   . TIA (transient ischemic attack) 08/01/2014  . Varicose veins  of both legs with edema 05/07/2014  . Bilateral leg pain 04/16/2014  . Chronic kidney disease, stage III (moderate) ([place]) 12/21/2013  . Vascular dementia without behavioral disturbance ([place])   . Type 2 diabetes mellitus, controlled, with renal complications ([place]) 123456  . Diastolic dysfunction 123XX123  . Knee pain, bilateral 03/01/2013  . Hereditary and idiopathic peripheral neuropathy 11/26/2012  . Osteoporosis, unspecified 11/26/2012  . GI bleed due to NSAIDs, DDX=Isch colitis, infectious colitis 11/19/2012  . Cough 05/29/2012   . 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 CONSTIPATION, CHRONIC 08/02/2010  . INSOMNIA, CHRONIC 03/08/2010  . Hypothyroidism 01/04/2010  . VITAMIN B12 DEFICIENCY 01/04/2010  . HYPERCHOLESTEROLEMIA 08/12/2009  . Essential hypertension 08/12/2009  . CAD (coronary artery disease) 08/12/2009  . GERD 08/12/2009    Past Surgical History:  Procedure Laterality Date  . BACK SURGERY    . CARDIAC CATHETERIZATION  08/2011  . CATARACT EXTRACTION Left 2009  . COLONOSCOPY    . ESOPHAGOGASTRODUODENOSCOPY N/A 11/19/2012   Procedure: ESOPHAGOGASTRODUODENOSCOPY (EGD);  Surgeon: Jerene Bears, MD;  Location: [facility];  Service: Gastroenterology;  Laterality: N/A;  . FINGER AMPUTATION Right    pinky finger  . HAND SURGERY Right    crush injury, right fifth digit contracture, limited  motion  . HARDWARE REMOVAL  02/05/2012   Procedure: HARDWARE REMOVAL;  Surgeon: Johnny Bridge, MD;  Location: [facility];  Service: Orthopedics;  Laterality: Left;  . KNEE SURGERY Left 1991   "did it twice in 1 wk" (02/17/2013)  . LUMBAR SPINE SURGERY  2007   Dr Philip Aspen ([place])  . REVERSE SHOULDER ARTHROPLASTY  02/05/2012   Procedure: REVERSE SHOULDER ARTHROPLASTY;  Surgeon: Johnny Bridge, MD;  Location: [facility];  Service: Orthopedics;  Laterality: Left;  . SHOULDER HEMI-ARTHROPLASTY    . SHOULDER OPEN ROTATOR CUFF REPAIR Left 8.30.2011  . TOTAL KNEE ARTHROPLASTY Left 07/12/2015   Procedure: TOTAL KNEE ARTHROPLASTY;  Surgeon: Marchia Bond, MD;  Location: [facility];  Service: Orthopedics;  Laterality: Left;  . US ECHOCARDIOGRAPHY  02-28-2010   Est EF 50-55%        Home Medications    Prior to Admission medications   Medication Sig Start Date End Date Taking? Authorizing Provider  aspirin EC 81 MG tablet Take 81 mg by mouth daily.     [provider]  bismuth subsalicylate (PEPTO BISMOL) 262 MG/15ML suspension Take 30 mLs by mouth daily as needed for indigestion or diarrhea or loose  stools.     [provider]  diclofenac sodium (VOLTAREN) 1 % GEL Apply 2 g topically 4 (four) times daily. 08/04/18   Rutherford Guys, MD  docusate sodium (COLACE) 100 MG capsule Take 1 capsule (100 mg total) by mouth every 12 (twelve) hours. 07/29/18   Harris, Vernie Shanks, PA-C  famotidine (PEPCID) 20 MG tablet Take 1 tablet (20 mg total) by mouth 2 (two) times daily. 07/23/19   Rutherford Guys, MD  furosemide (LASIX) 40 MG tablet Take 1 tablet (40 mg total) by mouth daily. 07/03/18   Nahser, Wonda Cheng, MD  gabapentin (NEURONTIN) 300 MG capsule Take 2 capsules (600 mg total) by mouth 3 (three) times daily. 07/23/19   Rutherford Guys, MD  isosorbide mononitrate (IMDUR) 30 MG 24 hr tablet Take 1 tablet (30 mg total) by mouth daily. 10/02/18   Nahser, Wonda Cheng, MD  levothyroxine (SYNTHROID) 50 MCG tablet Take 1 tablet (50 mcg) every Tuesday and Saturday morning before breakfast. Take 75 mcg on all  other days. 07/27/19   Rutherford Guys, MD  levothyroxine (SYNTHROID) 75 MCG tablet Take 1 tablet (95mcg) every Monday, Wednesday, Thursday, Friday and Sunday morning before breakfast. Take 50 mcg on all other days 07/27/19   Rutherford Guys, MD  lidocaine (LIDODERM) 5 % Place 1 patch onto the skin daily. Remove & Discard patch within 12 hours or as directed by MD 09/04/18   Rutherford Guys, MD  magnesium hydroxide (MILK OF MAGNESIA) 400 MG/5ML suspension Take 15 mLs by mouth daily as needed for mild constipation or moderate constipation.    [provider]  nitroGLYCERIN (NITROSTAT) 0.4 MG SL tablet Place 1 tablet (0.4 mg total) under the tongue every 5 (five) minutes as needed for chest pain. 07/03/18   Nahser, Wonda Cheng, MD  omeprazole (PRILOSEC) 20 MG capsule Take 1 capsule (20 mg total) by mouth 2 (two) times daily before a meal. 07/01/18   McVey, Gelene Mink, PA-C  potassium chloride SA (KLOR-CON) 20 MEQ tablet Take 1 tablet (20 mEq total) by mouth 2 (two) times daily. 07/23/19    Rutherford Guys, MD    Family History Family History  Problem Relation Age of Onset  . Breast cancer Mother   . Cancer Mother   . Diabetes Mother   . Cancer Brother        throat  . Stroke Brother   . Kidney disease Father   . Diabetes Sister   . Diabetes Daughter   . Colon cancer Neg Hx   . Heart attack Neg Hx     Social History Social History   Tobacco Use  . Smoking status: Former Smoker    Types: Cigars    Quit date: 09/03/1978    Years since quitting: 40.9  . Smokeless tobacco: Current User    Types: Chew  . Tobacco comment: 02/17/2013 "I aien't smoked a cigar in 20-30 yr"  Substance Use Topics  . Alcohol use: No    Alcohol/week: 0.0 standard drinks  . Drug use: No     Allergies   Crestor [rosuvastatin calcium] and Mestinon [pyridostigmine bromide er]   Review of Systems Review of Systems  Unable to perform ROS: Dementia  Respiratory: Positive for cough.   Cardiovascular: Positive for chest pain.     Physical Exam Updated Vital Signs BP (!) 149/68   Pulse 62   Temp 98.4 F (36.9 C) (Oral)   Resp 20   SpO2 97%   Physical Exam Vitals signs and nursing note reviewed.  Constitutional:      Appearance: He is well-developed.  HENT:     Head: Normocephalic.  Neck:     Musculoskeletal: Normal range of motion.  Cardiovascular:     Rate and Rhythm: Normal rate.     Heart sounds: Normal heart sounds.  Pulmonary:     Effort: Pulmonary effort is normal.     Breath sounds: Normal breath sounds.  Abdominal:     General: There is no distension.     Palpations: Abdomen is soft.     Tenderness: There is abdominal tenderness.     Comments: Tender epigastric area,    Musculoskeletal: Normal range of motion.  Skin:    General: Skin is warm.  Neurological:     General: No focal deficit present.     Mental Status: He is alert and oriented to person, place, and time.  Psychiatric:        Behavior: Behavior is agitated.      ED Treatments /  Results   Labs (all labs ordered are listed, but only abnormal results are displayed) Labs Reviewed  BASIC METABOLIC PANEL - Abnormal; Notable for the following components:      Result Value   Sodium 134 (*)    Chloride 96 (*)    CO2 21 (*)    Glucose, Bld 143 (*)    Creatinine, Ser 1.61 (*)    GFR calc non Af Amer 37 (*)    GFR calc Af Amer 43 (*)    Anion gap 17 (*)    All other components within normal limits  HEPATIC FUNCTION PANEL - Abnormal; Notable for the following components:   Albumin 3.1 (*)    Total Bilirubin 1.6 (*)    Bilirubin, Direct 0.5 (*)    Indirect Bilirubin 1.1 (*)    All other components within normal limits  SARS CORONAVIRUS 2 (TAT 6-24 HRS)  CBC  LIPASE, BLOOD  TROPONIN I (HIGH SENSITIVITY)  TROPONIN I (HIGH SENSITIVITY)    EKG EKG Interpretation  Date/Time:  Monday August 03 2019 14:54:57 EST Ventricular Rate:  86 PR Interval:  174 QRS Duration: 90 QT Interval:  376 QTC Calculation: 449 R Axis:   -57 Text Interpretation: Sinus rhythm with Premature atrial complexes Left anterior fascicular block Nonspecific ST and T wave abnormality Abnormal ECG Nonspecific TW changes Confirmed by Gareth Morgan [phone]) on 08/03/2019 4:39:55 PM   Radiology Dg Chest 2 View  Result Date: 08/03/2019 CLINICAL DATA:  Chest pain EXAM: CHEST - 2 VIEW COMPARISON:  None. FINDINGS: Chronic mild interstitial prominence. No new consolidation or edema. No effusion or pneumothorax. Stable cardiomediastinal contours. Chronic lower thoracic spine compression deformity. IMPRESSION: No acute process in the chest. Electronically Signed   By: Macy Mis M.D.   On: 08/03/2019 16:50    Procedures Procedures (including critical care time)  Medications Ordered in ED Medications  sodium chloride flush (NS) 0.9 % injection 3 mL (0 mLs Intravenous Hold 08/03/19 1701)  alum & mag hydroxide-simeth (MAALOX/MYLANTA) 200-200-20 MG/5ML suspension 30 mL (has no administration in time  range)    And  lidocaine (XYLOCAINE) 2 % viscous mouth solution 15 mL (has no administration in time range)     Initial Impression / Assessment and Plan / ED Course  I have reviewed the triage vital signs and the nursing notes.  Pertinent labs & imaging results that were available during my care of the patient were reviewed by me and considered in my medical decision making (see chart for details).        MDM   Pt has a negative troponin x 2.  Chest xray is normal, EKg no acute abnormality, troponin is negative x 2.  Ct   Final Clinical Impressions(s) / ED Diagnoses   Final diagnoses:  None    ED Discharge Orders    None       Fransico Meadow, PA-C 08/03/19 1902    Sidney Ace 08/03/19 2235    Gareth Morgan, MD 08/03/19 2308

## 2019-08-04 ENCOUNTER — Telehealth: Payer: Self-pay

## 2019-08-04 ENCOUNTER — Encounter: Payer: Self-pay | Admitting: Gastroenterology

## 2019-08-04 LAB — URINE CULTURE: Culture: NO GROWTH

## 2019-08-04 NOTE — Telephone Encounter (Signed)
Yes thanks 

## 2019-08-04 NOTE — Telephone Encounter (Signed)
[name] Elliott is on the line and they need to know if they can change his prescription for levothyroxine (SYNTHROID) to a different manufacture.

## 2019-08-05 ENCOUNTER — Ambulatory Visit: Payer: PPO | Admitting: Vascular Surgery

## 2019-08-16 ENCOUNTER — Inpatient Hospital Stay (HOSPITAL_COMMUNITY)
Admission: EM | Admit: 2019-08-16 | Discharge: 2019-08-25 | DRG: 683 | Disposition: A | Discharge status: Home / Self Care | Payer: PPO | Attending: Family Medicine | Admitting: Family Medicine

## 2019-08-16 ENCOUNTER — Other Ambulatory Visit: Payer: Self-pay

## 2019-08-16 ENCOUNTER — Emergency Department (HOSPITAL_COMMUNITY): Payer: PPO

## 2019-08-16 ENCOUNTER — Inpatient Hospital Stay (HOSPITAL_COMMUNITY): Payer: PPO

## 2019-08-16 DIAGNOSIS — S0101XA Laceration without foreign body of scalp, initial encounter: Secondary | ICD-10-CM | POA: Diagnosis not present

## 2019-08-16 DIAGNOSIS — F0390 Unspecified dementia without behavioral disturbance: Secondary | ICD-10-CM

## 2019-08-16 DIAGNOSIS — W19XXXA Unspecified fall, initial encounter: Secondary | ICD-10-CM | POA: Diagnosis not present

## 2019-08-16 DIAGNOSIS — N179 Acute kidney failure, unspecified: Secondary | ICD-10-CM | POA: Diagnosis not present

## 2019-08-16 DIAGNOSIS — E860 Dehydration: Secondary | ICD-10-CM | POA: Diagnosis present

## 2019-08-16 DIAGNOSIS — S199XXA Unspecified injury of neck, initial encounter: Secondary | ICD-10-CM | POA: Diagnosis not present

## 2019-08-16 DIAGNOSIS — S01112A Laceration without foreign body of left eyelid and periocular area, initial encounter: Secondary | ICD-10-CM | POA: Diagnosis present

## 2019-08-16 DIAGNOSIS — W07XXXA Fall from chair, initial encounter: Secondary | ICD-10-CM | POA: Diagnosis present

## 2019-08-16 DIAGNOSIS — R531 Weakness: Secondary | ICD-10-CM | POA: Diagnosis not present

## 2019-08-16 DIAGNOSIS — I482 Chronic atrial fibrillation, unspecified: Secondary | ICD-10-CM | POA: Diagnosis not present

## 2019-08-16 DIAGNOSIS — Z7982 Long term (current) use of aspirin: Secondary | ICD-10-CM

## 2019-08-16 DIAGNOSIS — S51812A Laceration without foreign body of left forearm, initial encounter: Secondary | ICD-10-CM | POA: Diagnosis not present

## 2019-08-16 DIAGNOSIS — F0150 Vascular dementia without behavioral disturbance: Secondary | ICD-10-CM | POA: Diagnosis present

## 2019-08-16 DIAGNOSIS — Z7401 Bed confinement status: Secondary | ICD-10-CM | POA: Diagnosis not present

## 2019-08-16 DIAGNOSIS — Z20828 Contact with and (suspected) exposure to other viral communicable diseases: Secondary | ICD-10-CM | POA: Diagnosis present

## 2019-08-16 DIAGNOSIS — R000 Tachycardia, unspecified: Secondary | ICD-10-CM | POA: Diagnosis present

## 2019-08-16 DIAGNOSIS — Z9114 Patient's other noncompliance with medication regimen: Secondary | ICD-10-CM

## 2019-08-16 DIAGNOSIS — R410 Disorientation, unspecified: Secondary | ICD-10-CM

## 2019-08-16 DIAGNOSIS — M2550 Pain in unspecified joint: Secondary | ICD-10-CM | POA: Diagnosis not present

## 2019-08-16 DIAGNOSIS — Z79899 Other long term (current) drug therapy: Secondary | ICD-10-CM

## 2019-08-16 DIAGNOSIS — R070 Pain in throat: Secondary | ICD-10-CM | POA: Diagnosis present

## 2019-08-16 DIAGNOSIS — I5022 Chronic systolic (congestive) heart failure: Secondary | ICD-10-CM | POA: Diagnosis not present

## 2019-08-16 DIAGNOSIS — R0602 Shortness of breath: Secondary | ICD-10-CM | POA: Diagnosis not present

## 2019-08-16 DIAGNOSIS — R6250 Unspecified lack of expected normal physiological development in childhood: Secondary | ICD-10-CM | POA: Diagnosis not present

## 2019-08-16 DIAGNOSIS — I130 Hypertensive heart and chronic kidney disease with heart failure and stage 1 through stage 4 chronic kidney disease, or unspecified chronic kidney disease: Secondary | ICD-10-CM | POA: Diagnosis present

## 2019-08-16 DIAGNOSIS — E46 Unspecified protein-calorie malnutrition: Secondary | ICD-10-CM | POA: Diagnosis not present

## 2019-08-16 DIAGNOSIS — Z609 Problem related to social environment, unspecified: Secondary | ICD-10-CM

## 2019-08-16 DIAGNOSIS — Z515 Encounter for palliative care: Secondary | ICD-10-CM

## 2019-08-16 DIAGNOSIS — G4700 Insomnia, unspecified: Secondary | ICD-10-CM | POA: Diagnosis present

## 2019-08-16 DIAGNOSIS — R0781 Pleurodynia: Secondary | ICD-10-CM | POA: Diagnosis not present

## 2019-08-16 DIAGNOSIS — K219 Gastro-esophageal reflux disease without esophagitis: Secondary | ICD-10-CM | POA: Diagnosis present

## 2019-08-16 DIAGNOSIS — I444 Left anterior fascicular block: Secondary | ICD-10-CM | POA: Diagnosis present

## 2019-08-16 DIAGNOSIS — Z833 Family history of diabetes mellitus: Secondary | ICD-10-CM

## 2019-08-16 DIAGNOSIS — Z66 Do not resuscitate: Secondary | ICD-10-CM | POA: Diagnosis not present

## 2019-08-16 DIAGNOSIS — S40022A Contusion of left upper arm, initial encounter: Secondary | ICD-10-CM | POA: Diagnosis present

## 2019-08-16 DIAGNOSIS — I5042 Chronic combined systolic (congestive) and diastolic (congestive) heart failure: Secondary | ICD-10-CM | POA: Diagnosis present

## 2019-08-16 DIAGNOSIS — R52 Pain, unspecified: Secondary | ICD-10-CM | POA: Diagnosis not present

## 2019-08-16 DIAGNOSIS — Y92009 Unspecified place in unspecified non-institutional (private) residence as the place of occurrence of the external cause: Secondary | ICD-10-CM | POA: Diagnosis not present

## 2019-08-16 DIAGNOSIS — Z7189 Other specified counseling: Secondary | ICD-10-CM | POA: Diagnosis not present

## 2019-08-16 DIAGNOSIS — I2510 Atherosclerotic heart disease of native coronary artery without angina pectoris: Secondary | ICD-10-CM | POA: Diagnosis not present

## 2019-08-16 DIAGNOSIS — F17290 Nicotine dependence, other tobacco product, uncomplicated: Secondary | ICD-10-CM | POA: Diagnosis present

## 2019-08-16 DIAGNOSIS — I480 Paroxysmal atrial fibrillation: Secondary | ICD-10-CM | POA: Diagnosis present

## 2019-08-16 DIAGNOSIS — N183 Chronic kidney disease, stage 3 unspecified: Secondary | ICD-10-CM

## 2019-08-16 DIAGNOSIS — F0280 Dementia in other diseases classified elsewhere without behavioral disturbance: Secondary | ICD-10-CM | POA: Diagnosis not present

## 2019-08-16 DIAGNOSIS — R062 Wheezing: Secondary | ICD-10-CM | POA: Diagnosis present

## 2019-08-16 DIAGNOSIS — S0181XA Laceration without foreign body of other part of head, initial encounter: Secondary | ICD-10-CM | POA: Diagnosis not present

## 2019-08-16 DIAGNOSIS — Z209 Contact with and (suspected) exposure to unspecified communicable disease: Secondary | ICD-10-CM | POA: Diagnosis not present

## 2019-08-16 DIAGNOSIS — Z8673 Personal history of transient ischemic attack (TIA), and cerebral infarction without residual deficits: Secondary | ICD-10-CM

## 2019-08-16 DIAGNOSIS — Z841 Family history of disorders of kidney and ureter: Secondary | ICD-10-CM

## 2019-08-16 DIAGNOSIS — Z955 Presence of coronary angioplasty implant and graft: Secondary | ICD-10-CM

## 2019-08-16 DIAGNOSIS — R1013 Epigastric pain: Secondary | ICD-10-CM | POA: Diagnosis present

## 2019-08-16 DIAGNOSIS — R4182 Altered mental status, unspecified: Secondary | ICD-10-CM | POA: Diagnosis present

## 2019-08-16 DIAGNOSIS — E039 Hypothyroidism, unspecified: Secondary | ICD-10-CM | POA: Diagnosis not present

## 2019-08-16 DIAGNOSIS — I248 Other forms of acute ischemic heart disease: Secondary | ICD-10-CM | POA: Diagnosis present

## 2019-08-16 DIAGNOSIS — E1122 Type 2 diabetes mellitus with diabetic chronic kidney disease: Secondary | ICD-10-CM | POA: Diagnosis present

## 2019-08-16 DIAGNOSIS — R079 Chest pain, unspecified: Secondary | ICD-10-CM | POA: Diagnosis present

## 2019-08-16 DIAGNOSIS — Z803 Family history of malignant neoplasm of breast: Secondary | ICD-10-CM

## 2019-08-16 DIAGNOSIS — G309 Alzheimer's disease, unspecified: Secondary | ICD-10-CM | POA: Diagnosis not present

## 2019-08-16 DIAGNOSIS — Z96612 Presence of left artificial shoulder joint: Secondary | ICD-10-CM | POA: Diagnosis present

## 2019-08-16 DIAGNOSIS — Z79890 Hormone replacement therapy (postmenopausal): Secondary | ICD-10-CM

## 2019-08-16 DIAGNOSIS — E1165 Type 2 diabetes mellitus with hyperglycemia: Secondary | ICD-10-CM | POA: Diagnosis not present

## 2019-08-16 LAB — TROPONIN I (HIGH SENSITIVITY)
Troponin I (High Sensitivity): 69 ng/L — ABNORMAL HIGH (ref <18)
Troponin I (High Sensitivity): 73 ng/L — ABNORMAL HIGH (ref <18)
Troponin I (High Sensitivity): 71 ng/L — ABNORMAL HIGH (ref <18)
Troponin I (High Sensitivity): 68 ng/L — ABNORMAL HIGH (ref <18)

## 2019-08-16 LAB — CBC WITH DIFFERENTIAL/PLATELET
Abs Immature Granulocytes: 0.08 10*3/uL — ABNORMAL HIGH (ref 0.00–0.07)
Basophils Absolute: 0 10*3/uL (ref 0.0–0.1)
Basophils Relative: 0 %
Eosinophils Absolute: 0 10*3/uL (ref 0.0–0.5)
Eosinophils Relative: 0 %
HCT: 48.3 % (ref 39.0–52.0)
Hemoglobin: 15.9 g/dL (ref 13.0–17.0)
Immature Granulocytes: 1 %
Lymphocytes Relative: 6 %
Lymphs Abs: 0.8 10*3/uL (ref 0.7–4.0)
MCH: 29.2 pg (ref 26.0–34.0)
MCHC: 32.9 g/dL (ref 30.0–36.0)
MCV: 88.6 fL (ref 80.0–100.0)
Monocytes Absolute: 1.1 10*3/uL — ABNORMAL HIGH (ref 0.1–1.0)
Monocytes Relative: 9 %
Neutro Abs: 10.6 10*3/uL — ABNORMAL HIGH (ref 1.7–7.7)
Neutrophils Relative %: 84 %
Platelets: 194 10*3/uL (ref 150–400)
RBC: 5.45 MIL/uL (ref 4.22–5.81)
RDW: 14.9 % (ref 11.5–15.5)
WBC: 12.6 10*3/uL — ABNORMAL HIGH (ref 4.0–10.5)
nRBC: 0 % (ref 0.0–0.2)

## 2019-08-16 LAB — URINALYSIS, ROUTINE W REFLEX MICROSCOPIC
Bacteria, UA: NONE SEEN
Bilirubin Urine: NEGATIVE
Glucose, UA: 50 mg/dL — AB
Ketones, ur: 5 mg/dL — AB
Leukocytes,Ua: NEGATIVE
Nitrite: NEGATIVE
Protein, ur: 30 mg/dL — AB
Specific Gravity, Urine: 1.02 (ref 1.005–1.030)
pH: 5 (ref 5.0–8.0)

## 2019-08-16 LAB — SARS CORONAVIRUS 2 (TAT 6-24 HRS): SARS Coronavirus 2: NEGATIVE

## 2019-08-16 LAB — HEPATIC FUNCTION PANEL
ALT: 15 U/L (ref 0–44)
AST: 23 U/L (ref 15–41)
Albumin: 3.1 g/dL — ABNORMAL LOW (ref 3.5–5.0)
Alkaline Phosphatase: 49 U/L (ref 38–126)
Bilirubin, Direct: 0.4 mg/dL — ABNORMAL HIGH (ref 0.0–0.2)
Indirect Bilirubin: 1.1 mg/dL — ABNORMAL HIGH (ref 0.3–0.9)
Total Bilirubin: 1.5 mg/dL — ABNORMAL HIGH (ref 0.3–1.2)
Total Protein: 6.6 g/dL (ref 6.5–8.1)

## 2019-08-16 LAB — BASIC METABOLIC PANEL
Anion gap: 19 — ABNORMAL HIGH (ref 5–15)
BUN: 85 mg/dL — ABNORMAL HIGH (ref 8–23)
CO2: 19 mmol/L — ABNORMAL LOW (ref 22–32)
Calcium: 9.1 mg/dL (ref 8.9–10.3)
Chloride: 98 mmol/L (ref 98–111)
Creatinine, Ser: 3.42 mg/dL — ABNORMAL HIGH (ref 0.61–1.24)
GFR calc Af Amer: 17 mL/min — ABNORMAL LOW (ref >60)
GFR calc non Af Amer: 15 mL/min — ABNORMAL LOW (ref >60)
Glucose, Bld: 200 mg/dL — ABNORMAL HIGH (ref 70–99)
Potassium: 4.3 mmol/L (ref 3.5–5.1)
Sodium: 136 mmol/L (ref 135–145)

## 2019-08-16 LAB — RAPID URINE DRUG SCREEN, HOSP PERFORMED
Amphetamines: NOT DETECTED
Barbiturates: NOT DETECTED
Benzodiazepines: NOT DETECTED
Cocaine: NOT DETECTED
Opiates: NOT DETECTED
Tetrahydrocannabinol: NOT DETECTED

## 2019-08-16 LAB — POC SARS CORONAVIRUS 2 AG -  ED: SARS Coronavirus 2 Ag: NEGATIVE

## 2019-08-16 LAB — TSH: TSH: 4.453 u[IU]/mL (ref 0.350–4.500)

## 2019-08-16 LAB — BRAIN NATRIURETIC PEPTIDE: B Natriuretic Peptide: 118.8 pg/mL — ABNORMAL HIGH (ref 0.0–100.0)

## 2019-08-16 LAB — ETHANOL: Alcohol, Ethyl (B): <10 mg/dL (ref <10)

## 2019-08-16 LAB — LACTIC ACID, PLASMA
Lactic Acid, Venous: 2.1 mmol/L (ref 0.5–1.9)
Lactic Acid, Venous: 1.7 mmol/L (ref 0.5–1.9)

## 2019-08-16 LAB — GLUCOSE, CAPILLARY: Glucose-Capillary: 121 mg/dL — ABNORMAL HIGH (ref 70–99)

## 2019-08-16 LAB — AMMONIA: Ammonia: 16 umol/L (ref 9–35)

## 2019-08-16 MED ORDER — ASPIRIN EC 81 MG PO TBEC
81.0000 mg | DELAYED_RELEASE_TABLET | Freq: Every day | ORAL | Status: DC
  Administered 2019-08-16 – 2019-08-20 (×5): 81 mg via ORAL
  Filled 2019-08-16 (×5): qty 1

## 2019-08-16 MED ORDER — PANTOPRAZOLE SODIUM 40 MG PO TBEC
40.0000 mg | DELAYED_RELEASE_TABLET | Freq: Two times a day (BID) | ORAL | Status: DC
  Administered 2019-08-17 – 2019-08-25 (×15): 40 mg via ORAL
  Filled 2019-08-16 (×17): qty 1

## 2019-08-16 MED ORDER — SODIUM CHLORIDE 0.9 % IV SOLN
INTRAVENOUS | Status: DC
  Administered 2019-08-16 – 2019-08-17 (×2): via INTRAVENOUS

## 2019-08-16 MED ORDER — ACETAMINOPHEN 325 MG PO TABS
650.0000 mg | ORAL_TABLET | Freq: Four times a day (QID) | ORAL | Status: DC | PRN
  Administered 2019-08-21 – 2019-08-22 (×2): 650 mg via ORAL
  Filled 2019-08-16 (×2): qty 2

## 2019-08-16 MED ORDER — ACETAMINOPHEN 500 MG PO TABS: 1000.0000 mg | ORAL_TABLET | Freq: Four times a day (QID) | ORAL | Status: DC | PRN

## 2019-08-16 MED ORDER — GABAPENTIN 300 MG PO CAPS: 600.0000 mg | ORAL_CAPSULE | Freq: Three times a day (TID) | ORAL | Status: DC

## 2019-08-16 MED ORDER — LEVOTHYROXINE SODIUM 50 MCG PO TABS
50.0000 ug | ORAL_TABLET | ORAL | Status: DC
  Administered 2019-08-18 – 2019-08-25 (×3): 50 ug via ORAL
  Filled 2019-08-16 (×2): qty 1

## 2019-08-16 MED ORDER — LEVOTHYROXINE SODIUM 50 MCG PO TABS: 50.0000 ug | ORAL_TABLET | Freq: Every day | ORAL | Status: DC

## 2019-08-16 MED ORDER — HEPARIN SODIUM (PORCINE) 5000 UNIT/ML IJ SOLN
5000.0000 [IU] | Freq: Three times a day (TID) | INTRAMUSCULAR | Status: DC
  Administered 2019-08-16 – 2019-08-25 (×26): 5000 [IU] via SUBCUTANEOUS
  Filled 2019-08-16 (×27): qty 1

## 2019-08-16 MED ORDER — LEVOTHYROXINE SODIUM 75 MCG PO TABS
75.0000 ug | ORAL_TABLET | ORAL | Status: DC
  Administered 2019-08-17 – 2019-08-24 (×6): 75 ug via ORAL
  Filled 2019-08-16 (×8): qty 1

## 2019-08-16 MED ORDER — ISOSORBIDE MONONITRATE ER 30 MG PO TB24
30.0000 mg | ORAL_TABLET | Freq: Every day | ORAL | Status: DC
  Administered 2019-08-17 – 2019-08-25 (×9): 30 mg via ORAL
  Filled 2019-08-16 (×10): qty 1

## 2019-08-16 MED ORDER — ACETAMINOPHEN 325 MG PO TABS
650.0000 mg | ORAL_TABLET | Freq: Once | ORAL | Status: AC
  Administered 2019-08-16: 15:00:00 650 mg via ORAL
  Filled 2019-08-16: qty 2

## 2019-08-16 MED ORDER — SODIUM CHLORIDE 0.9 % IV BOLUS
500.0000 mL | Freq: Once | INTRAVENOUS | Status: AC
  Administered 2019-08-16: 15:00:00 500 mL via INTRAVENOUS

## 2019-08-16 NOTE — Progress Notes (Addendum)
Family Medicine Progress Note  Paged about Lactic Acid of 2.1. Slightly elevated as normal cutoff above 1.9. Will watch with recheck in 2 hours. If still elevated will consider giving another small fluid bolus, or possibly start abx. If below 2 will stop checking and consider problem resolved.  Guadalupe Dawn MD PGY-3 Family Medicine Resident

## 2019-08-16 NOTE — ED Notes (Signed)
ED TO INPATIENT HANDOFF REPORT  ED Nurse Name and Phone #: Gwenlyn Perking [phone]  S Name/Age/Gender [name] Elliott 85 y.o. male Room/Bed: 008C/008C  Code Status   Code Status: Partial Code  Home/SNF/Other Home Patient oriented to: self Is this baseline? Yes   Triage Complete: Triage complete  Chief Complaint AKI (acute kidney injury) ([place]) [N17.9] Altered mental status [R41.82]  Triage Note Pt has dementia but lives by himself, here via [facility].  Pt's daughter has covid and he was tested thurs and was neg.  Pt was found on ground, unknown down time.  Pt has lac to L forehead, skin tear to L FA.  Pt keeps c/o exertional sob, but sats are 96 and pt does not seem in respiratory distress.  cbg 261  134/64 69 hr 96% 16 rr     Allergies Allergies  Allergen Reactions  . Crestor [Rosuvastatin Calcium] Other (See Comments)    Muscle pain/aches  . Mestinon [Pyridostigmine Bromide Er] Other (See Comments)    GI upset, stomach cramps  . Xarelto [Rivaroxaban] Other (See Comments)    bleeding    Level of Care/Admitting Diagnosis ED Disposition    ED Disposition Condition Comment   Admit  Hospital Area: [facility] [[id number]]  Level of Care: Telemetry Medical [104]  Covid Evaluation: Confirmed COVID Negative  Diagnosis: Altered mental status [780.97.ICD-9-CM]  Admitting Physician: Zenia Resides [[id number]]  Attending Physician: Zenia Resides [[id number]]  Estimated length of stay: past midnight tomorrow  Certification:: I certify this patient will need inpatient services for at least 2 midnights       B Medical/Surgery History Past Medical History:  Diagnosis Date  . Arthritis   . Arthropathy, unspecified, site unspecified   . Atrial fibrillation ([place])   . Blood dyscrasia    thromboctopenia pt family states he was never told of this  . CAD (coronary artery disease)    last cath in 2012. Managed medically-some blockages  . Chronic lower back pain   .  Chronic systolic CHF (congestive heart failure) ([place]) 06/04/2016  . Constipation, chronic   . Diabetes mellitus without complication ([place])    pt states he doesn't have  . DVT (deep venous thrombosis) ([place])   . Dysrhythmia   . Esophageal reflux   . 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 MRI Nov '11 - tendonosis and partial tear. Left shoulder surgery Jan '12 for partial shoulder replacement. [name])   . Headache(784.0)   . History of stomach ulcers 11/2012  . Hypothyroidism    pt states he doeen't have   . Orthostasis   . Other B-complex deficiencies   . Other malaise and fatigue   . Pain in joint, shoulder region    Has chronic shoulder pain  . Pain in limb   . Persistent disorder of initiating or maintaining sleep   . Pneumonia 1980's?; 2011  . Primary localized osteoarthritis of left knee 07/12/2015  . Pure hypercholesterolemia   . PVC's (premature ventricular contractions)   . S/P cardiac cath 08/08/11   mild to moderate CAD primarily in the LAD. None are obstructive and appear stable from prior cath in 2007; managed medically  . Sebaceous cyst   . Stroke ([place])   . Thrombocytopenia, unspecified ([place])   . Unspecified essential hypertension   . Vascular dementia without behavioral disturbance ([facility])    with periods of amnesia   Past Surgical History:  Procedure Laterality Date  . BACK SURGERY    .  CARDIAC CATHETERIZATION  08/2011  . CATARACT EXTRACTION Left 2009  . COLONOSCOPY    . ESOPHAGOGASTRODUODENOSCOPY N/A 11/19/2012   Procedure: ESOPHAGOGASTRODUODENOSCOPY (EGD);  Surgeon: Jerene Bears, MD;  Location: [facility];  Service: Gastroenterology;  Laterality: N/A;  . FINGER AMPUTATION Right    pinky finger  . HAND SURGERY Right    crush injury, right fifth digit contracture, limited  motion  . HARDWARE REMOVAL  02/05/2012   Procedure: HARDWARE REMOVAL;  Surgeon: Johnny Bridge, MD;  Location: [facility];  Service: Orthopedics;  Laterality: Left;  .  KNEE SURGERY Left 1991   "did it twice in 1 wk" (02/17/2013)  . LUMBAR SPINE SURGERY  2007   Dr Philip Aspen ([place])  . REVERSE SHOULDER ARTHROPLASTY  02/05/2012   Procedure: REVERSE SHOULDER ARTHROPLASTY;  Surgeon: Johnny Bridge, MD;  Location: [facility];  Service: Orthopedics;  Laterality: Left;  . SHOULDER HEMI-ARTHROPLASTY    . SHOULDER OPEN ROTATOR CUFF REPAIR Left 8.30.2011  . TOTAL KNEE ARTHROPLASTY Left 07/12/2015   Procedure: TOTAL KNEE ARTHROPLASTY;  Surgeon: Marchia Bond, MD;  Location: [facility];  Service: Orthopedics;  Laterality: Left;  . US ECHOCARDIOGRAPHY  02-28-2010   Est EF 50-55%     A IV Location/Drains/Wounds Patient Lines/Drains/Airways Status   Active Line/Drains/Airways    Name:   Placement date:   Placement time:   Site:   Days:   Peripheral IV 08/16/19 Right Antecubital   08/16/19    1220    Antecubital   less than 1   Wound / Incision (Open or Dehisced) 03/31/19 Other (Comment) Leg Right;Lower Scabbed, red   03/31/19    0956    Leg   138          Intake/Output Last 24 hours No intake or output data in the 24 hours ending 08/16/19 2206  Labs/Imaging Results for orders placed or performed during the hospital encounter of 08/16/19 (from the past 48 hour(s))  Brain natriuretic peptide     Status: Abnormal   Collection Time: 08/16/19 12:17 PM  Result Value Ref Range   B Natriuretic Peptide 118.8 (H) 0.0 - 100.0 pg/mL    Comment: Performed at [facility] [address], [place], [place] [address]  Basic metabolic panel     Status: Abnormal   Collection Time: 08/16/19 12:17 PM  Result Value Ref Range   Sodium 136 135 - 145 mmol/L   Potassium 4.3 3.5 - 5.1 mmol/L   Chloride 98 98 - 111 mmol/L   CO2 19 (L) 22 - 32 mmol/L   Glucose, Bld 200 (H) 70 - 99 mg/dL   BUN 85 (H) 8 - 23 mg/dL   Creatinine, Ser 3.42 (H) 0.61 - 1.24 mg/dL   Calcium 9.1 8.9 - 10.3 mg/dL   GFR calc non Af Amer 15 (L) >60 mL/min   GFR calc Af Amer 17 (L) >60 mL/min   Anion gap 19  (H) 5 - 15    Comment: Performed at [facility], [place] [address] [place], [place] [address]  CBC with Differential     Status: Abnormal   Collection Time: 08/16/19 12:17 PM  Result Value Ref Range   WBC 12.6 (H) 4.0 - 10.5 K/uL   RBC 5.45 4.22 - 5.81 MIL/uL   Hemoglobin 15.9 13.0 - 17.0 g/dL   HCT 48.3 39.0 - 52.0 %   MCV 88.6 80.0 - 100.0 fL   MCH 29.2 26.0 - 34.0 pg   MCHC 32.9 30.0 -  36.0 g/dL   RDW 14.9 11.5 - 15.5 %   Platelets 194 150 - 400 K/uL   nRBC 0.0 0.0 - 0.2 %   Neutrophils Relative % 84 %   Neutro Abs 10.6 (H) 1.7 - 7.7 K/uL   Lymphocytes Relative 6 %   Lymphs Abs 0.8 0.7 - 4.0 K/uL   Monocytes Relative 9 %   Monocytes Absolute 1.1 (H) 0.1 - 1.0 K/uL   Eosinophils Relative 0 %   Eosinophils Absolute 0.0 0.0 - 0.5 K/uL   Basophils Relative 0 %   Basophils Absolute 0.0 0.0 - 0.1 K/uL   Immature Granulocytes 1 %   Abs Immature Granulocytes 0.08 (H) 0.00 - 0.07 K/uL    Comment: Performed at [facility] [address], [place], [place] [address]  Troponin I (High Sensitivity)     Status: Abnormal   Collection Time: 08/16/19 12:17 PM  Result Value Ref Range   Troponin I (High Sensitivity) 69 (H) <18 ng/L    Comment: (NOTE) Elevated high sensitivity troponin I (hsTnI) values and significant  changes across serial measurements may suggest ACS but many other  chronic and acute conditions are known to elevate hsTnI results.  Refer to the "Links" section for chest pain algorithms and additional  guidance. Performed at [facility], [place] [address] [place], [place] [address]   POC SARS Coronavirus 2 Ag-ED - Nasal Swab (BD Veritor Kit)     Status: None   Collection Time: 08/16/19  1:11 PM  Result Value Ref Range   SARS Coronavirus 2 Ag NEGATIVE NEGATIVE    Comment: (NOTE) SARS-CoV-2 antigen NOT DETECTED.  Negative results are presumptive.  Negative results do not preclude SARS-CoV-2 infection and should not be used as the sole basis  for treatment or other patient management decisions, including infection  control decisions, particularly in the presence of clinical signs and  symptoms consistent with COVID-19, or in those who have been in contact with the virus.  Negative results must be combined with clinical observations, patient history, and epidemiological information. The expected result is Negative. Fact Sheet for Patients: PodPark.tn Fact Sheet for Healthcare Providers: GiftContent.is This test is not yet approved or cleared by the [place] FDA and  has been authorized for detection and/or diagnosis of SARS-CoV-2 by FDA under an Emergency Use Authorization (EUA).  This EUA will remain in effect (meaning this test can be used) for the duration of  the COVID-19 de claration under Section 564(b)(1) of the Act, 21 U.S.C. section 360bbb-3(b)(1), unless the authorization is terminated or revoked sooner.   Troponin I (High Sensitivity)     Status: Abnormal   Collection Time: 08/16/19  3:02 PM  Result Value Ref Range   Troponin I (High Sensitivity) 73 (H) <18 ng/L    Comment: (NOTE) Elevated high sensitivity troponin I (hsTnI) values and significant  changes across serial measurements may suggest ACS but many other  chronic and acute conditions are known to elevate hsTnI results.  Refer to the "Links" section for chest pain algorithms and additional  guidance. Performed at [facility], [place] [address] [place], [place] [address]   SARS CORONAVIRUS 2 (TAT 6-24 HRS) Nasopharyngeal Nasopharyngeal Swab     Status: None   Collection Time: 08/16/19  3:02 PM   Specimen: Nasopharyngeal Swab  Result Value Ref Range   SARS Coronavirus 2 NEGATIVE NEGATIVE    Comment: (NOTE) SARS-CoV-2 target nucleic acids are NOT DETECTED. The SARS-CoV-2 RNA is  generally detectable in upper and lower respiratory specimens during the acute phase of infection.  Negative results do not preclude SARS-CoV-2 infection, do not rule out co-infections with other pathogens, and should not be used as the sole basis for treatment or other patient management decisions. Negative results must be combined with clinical observations, patient history, and epidemiological information. The expected result is Negative. Fact Sheet for Patients: SugarRoll.be Fact Sheet for Healthcare Providers: https://www.woods-mathews.com/ This test is not yet approved or cleared by the [place] FDA and  has been authorized for detection and/or diagnosis of SARS-CoV-2 by FDA under an Emergency Use Authorization (EUA). This EUA will remain  in effect (meaning this test can be used) for the duration of the COVID-19 declaration under Section 56 4(b)(1) of the Act, 21 U.S.C. section 360bbb-3(b)(1), unless the authorization is terminated or revoked sooner. Performed at [facility], [place] [address] [place], [place] [address]   Urinalysis, Routine w reflex microscopic     Status: Abnormal   Collection Time: 08/16/19  5:23 PM  Result Value Ref Range   Color, Urine YELLOW YELLOW   APPearance CLEAR CLEAR   Specific Gravity, Urine 1.020 1.005 - 1.030   pH 5.0 5.0 - 8.0   Glucose, UA 50 (A) NEGATIVE mg/dL   Hgb urine dipstick SMALL (A) NEGATIVE   Bilirubin Urine NEGATIVE NEGATIVE   Ketones, ur 5 (A) NEGATIVE mg/dL   Protein, ur 30 (A) NEGATIVE mg/dL   Nitrite NEGATIVE NEGATIVE   Leukocytes,Ua NEGATIVE NEGATIVE   RBC / HPF 0-5 0 - 5 RBC/hpf   WBC, UA 0-5 0 - 5 WBC/hpf   Bacteria, UA NONE SEEN NONE SEEN   Mucus PRESENT    Hyaline Casts, UA PRESENT     Comment: Performed at [facility] [address] [place], [place] [address]  Urine rapid drug screen (hosp performed)     Status: None   Collection Time: 08/16/19  5:23 PM  Result Value Ref Range   Opiates NONE DETECTED NONE DETECTED   Cocaine NONE DETECTED NONE DETECTED    Benzodiazepines NONE DETECTED NONE DETECTED   Amphetamines NONE DETECTED NONE DETECTED   Tetrahydrocannabinol NONE DETECTED NONE DETECTED   Barbiturates NONE DETECTED NONE DETECTED    Comment: (NOTE) DRUG SCREEN FOR MEDICAL PURPOSES ONLY.  IF CONFIRMATION IS NEEDED FOR ANY PURPOSE, NOTIFY LAB WITHIN 5 DAYS. LOWEST DETECTABLE LIMITS FOR URINE DRUG SCREEN Drug Class                     Cutoff (ng/mL) Amphetamine and metabolites    1000 Barbiturate and metabolites    200 Benzodiazepine                 945 Tricyclics and metabolites     300 Opiates and metabolites        300 Cocaine and metabolites        300 THC                            50 Performed at [facility], [place] [address] [place], [place] [address]   TSH     Status: None   Collection Time: 08/16/19  7:16 PM  Result Value Ref Range   TSH 4.453 0.350 - 4.500 uIU/mL    Comment: Performed by a 3rd Generation assay with a functional sensitivity of <=0.01 uIU/mL. Performed at [facility], [place]  [address] [place], [place] [address]   Ammonia     Status: None   Collection Time: 08/16/19  7:16 PM  Result Value Ref Range   Ammonia 16 9 - 35 umol/L    Comment: Performed at [facility], [place] [address] [place], [place] [address]  Ethanol     Status: None   Collection Time: 08/16/19  7:16 PM  Result Value Ref Range   Alcohol, Ethyl (B) <10 <10 mg/dL    Comment: (NOTE) Lowest detectable limit for serum alcohol is 10 mg/dL. For medical purposes only. Performed at [facility], [place] [address], [place], [place] [address]   Hepatic function panel     Status: Abnormal   Collection Time: 08/16/19  7:17 PM  Result Value Ref Range   Total Protein 6.6 6.5 - 8.1 g/dL   Albumin 3.1 (L) 3.5 - 5.0 g/dL   AST 23 15 - 41 U/L   ALT 15 0 - 44 U/L   Alkaline Phosphatase 49 38 - 126 U/L   Total Bilirubin 1.5 (H) 0.3 - 1.2 mg/dL   Bilirubin, Direct 0.4 (H) 0.0 - 0.2 mg/dL   Indirect Bilirubin 1.1 (H) 0.3 -  0.9 mg/dL    Comment: Performed at [facility] [address] [place], [place] [address]  Lactic acid, plasma     Status: Abnormal   Collection Time: 08/16/19  7:17 PM  Result Value Ref Range   Lactic Acid, Venous 2.1 (HH) 0.5 - 1.9 mmol/L    Comment: CRITICAL RESULT CALLED TO, READ BACK BY AND VERIFIED WITH: S.Khaidyn Staebell,RN @ 1952 08/16/2019 [place] Performed at [facility], [place] [address], [place], [place] [address]   Troponin I (High Sensitivity)     Status: Abnormal   Collection Time: 08/16/19  7:18 PM  Result Value Ref Range   Troponin I (High Sensitivity) 71 (H) <18 ng/L    Comment: (NOTE) Elevated high sensitivity troponin I (hsTnI) values and significant  changes across serial measurements may suggest ACS but many other  chronic and acute conditions are known to elevate hsTnI results.  Refer to the "Links" section for chest pain algorithms and additional  guidance. Performed at [facility], [place] [address] [place], [place] [address]   Troponin I (High Sensitivity)     Status: Abnormal   Collection Time: 08/16/19  9:18 PM  Result Value Ref Range   Troponin I (High Sensitivity) 68 (H) <18 ng/L    Comment: (NOTE) Elevated high sensitivity troponin I (hsTnI) values and significant  changes across serial measurements may suggest ACS but many other  chronic and acute conditions are known to elevate hsTnI results.  Refer to the "Links" section for chest pain algorithms and additional  guidance. Performed at [facility], [place] [address] [place], [place] [address]    DG Ribs Unilateral W/Chest Left  Result Date: 08/16/2019 CLINICAL DATA:  Left rib pain. Unwitnessed fall. EXAM: LEFT RIBS AND CHEST - 3+ VIEW COMPARISON:  August 03, 2019. FINDINGS: Old bilateral rib fractures are noted. No acute left rib fracture is noted. There is no evidence of pneumothorax or pleural effusion. Both lungs are clear. Heart size and mediastinal contours are within  normal limits. IMPRESSION: No acute cardiopulmonary abnormality seen. Old bilateral rib fractures are noted. No acute left rib fracture is noted. Electronically Signed   By: Marijo Conception M.D.   On: 08/16/2019 14:04   DG Forearm Left  Result Date:  08/16/2019 CLINICAL DATA:  Pt has dementia but lives by himself, here via [facility]. Pt's daughter has covid and he was tested thurs and was neg. Pt was found on ground, unknown down time. Pt has lac to L forehead, skin tear to L forearm and wrapped in gauze EXAM: LEFT FOREARM - 2 VIEW COMPARISON:  Left elbow radiographs 05/13/2010 FINDINGS: There is no evidence of fracture or other focal bone lesions. No evidence of dislocation. Osteopenia. There are a few nonspecific calcified densities in the posterior elbow which were present in 2011. IMPRESSION: No acute osseous abnormality in the left forearm. Electronically Signed   By: Audie Pinto M.D.   On: 08/16/2019 14:05   CT Head Wo Contrast  Result Date: 08/16/2019 CLINICAL DATA:  Found on ground. Laceration to the left forehead. Headache. EXAM: CT HEAD WITHOUT CONTRAST CT CERVICAL SPINE WITHOUT CONTRAST TECHNIQUE: Multidetector CT imaging of the head and cervical spine was performed following the standard protocol without intravenous contrast. Multiplanar CT image reconstructions of the cervical spine were also generated. COMPARISON:  Head CT, 08/01/2014. FINDINGS: CT HEAD FINDINGS Brain: No evidence of acute infarction, hemorrhage, hydrocephalus, extra-axial collection or mass lesion/mass effect. There is ventricular and sulcal enlargement reflecting mild diffuse atrophy. Widened extra-axial spaces are noted also reflecting volume loss. Patchy periventricular white matter hypoattenuation is present consistent with moderate chronic microvascular ischemic change, mildly advanced from prior head CT. Atrophy is also increased. Vascular: No hyperdense vessel or unexpected calcification. Skull: Normal. Negative for  fracture or focal lesion. Sinuses/Orbits: Globes and orbits are unremarkable. Sinuses and mastoid air cells are clear. Other: None. CT CERVICAL SPINE FINDINGS Alignment: Normal. Skull base and vertebrae: No acute fracture. No primary bone lesion or focal pathologic process. Soft tissues and spinal canal: No prevertebral fluid or swelling. No visible canal hematoma. Disc levels: Moderate loss of disc height at C4-C5, C5-C6 and C6-C7 with mild spondylotic disc bulging. No disc herniation or significant stenosis. Upper chest: No acute findings.  Clear lung apices. Other: None. IMPRESSION: HEAD CT 1. No acute intracranial abnormalities. 2. Atrophy and chronic microvascular ischemic change. CERVICAL CT 1. No fracture or acute finding. Electronically Signed   By: Lajean Manes M.D.   On: 08/16/2019 13:55   CT Cervical Spine Wo Contrast  Result Date: 08/16/2019 CLINICAL DATA:  Found on ground. Laceration to the left forehead. Headache. EXAM: CT HEAD WITHOUT CONTRAST CT CERVICAL SPINE WITHOUT CONTRAST TECHNIQUE: Multidetector CT imaging of the head and cervical spine was performed following the standard protocol without intravenous contrast. Multiplanar CT image reconstructions of the cervical spine were also generated. COMPARISON:  Head CT, 08/01/2014. FINDINGS: CT HEAD FINDINGS Brain: No evidence of acute infarction, hemorrhage, hydrocephalus, extra-axial collection or mass lesion/mass effect. There is ventricular and sulcal enlargement reflecting mild diffuse atrophy. Widened extra-axial spaces are noted also reflecting volume loss. Patchy periventricular white matter hypoattenuation is present consistent with moderate chronic microvascular ischemic change, mildly advanced from prior head CT. Atrophy is also increased. Vascular: No hyperdense vessel or unexpected calcification. Skull: Normal. Negative for fracture or focal lesion. Sinuses/Orbits: Globes and orbits are unremarkable. Sinuses and mastoid air cells are  clear. Other: None. CT CERVICAL SPINE FINDINGS Alignment: Normal. Skull base and vertebrae: No acute fracture. No primary bone lesion or focal pathologic process. Soft tissues and spinal canal: No prevertebral fluid or swelling. No visible canal hematoma. Disc levels: Moderate loss of disc height at C4-C5, C5-C6 and C6-C7 with mild spondylotic disc bulging. No disc herniation or significant stenosis.  Upper chest: No acute findings.  Clear lung apices. Other: None. IMPRESSION: HEAD CT 1. No acute intracranial abnormalities. 2. Atrophy and chronic microvascular ischemic change. CERVICAL CT 1. No fracture or acute finding. Electronically Signed   By: Lajean Manes M.D.   On: 08/16/2019 13:55    Pending Labs Unresulted Labs (From admission, onward)    Start     Ordered   08/17/19 4540  Basic metabolic panel  Tomorrow morning,   R     08/16/19 1628   08/17/19 0500  CBC  Tomorrow morning,   R     08/16/19 1628   08/16/19 1843  Lactic acid, plasma  STAT Now then every 3 hours,   R (with STAT occurrences)     08/16/19 1843   08/16/19 1632  Urine culture  ONCE - STAT,   STAT     08/16/19 1631          Vitals/Pain Today's Vitals   08/16/19 1930 08/16/19 2000 08/16/19 2033 08/16/19 2100  BP: (!) 147/66 116/72 119/63 (!) 135/56  Pulse: 86 90 (!) 29 95  Resp: 18 19 16 20   Temp:      TempSrc:      SpO2: 100% 93% 95% 99%    Isolation Precautions No active isolations  Medications Medications  pantoprazole (PROTONIX) EC tablet 40 mg (has no administration in time range)  aspirin EC tablet 81 mg (81 mg Oral Given 08/16/19 1915)  isosorbide mononitrate (IMDUR) 24 hr tablet 30 mg (30 mg Oral Not Given 08/16/19 2032)  heparin injection 5,000 Units (5,000 Units Subcutaneous Given 08/16/19 1915)  levothyroxine (SYNTHROID) tablet 50 mcg (has no administration in time range)    And  levothyroxine (SYNTHROID) tablet 75 mcg (has no administration in time range)  acetaminophen (TYLENOL) tablet 650 mg  (has no administration in time range)  0.9 %  sodium chloride infusion ( Intravenous New Bag/Given 08/16/19 1915)  acetaminophen (TYLENOL) tablet 650 mg (650 mg Oral Given 08/16/19 1455)  sodium chloride 0.9 % bolus 500 mL (0 mLs Intravenous Stopped 08/16/19 1916)    Mobility  High fall risk   Focused Assessments Neuro Assessment Handoff:  Swallow screen pass? No          Neuro Assessment: Exceptions to WDL Neuro Checks:      Last Documented NIHSS Modified Score:   Has TPA been given? No If patient is a Neuro Trauma and patient is going to OR before floor call report to [facility] nurse: [phone] or [phone]     R Recommendations: See Admitting Provider Note  Report given to:   Additional Notes:

## 2019-08-16 NOTE — ED Notes (Signed)
Spoke to Dr. Kris Mouton about critical lactic result of 2.1. No further orders at this time except wait for re draw of 2nd lactic.

## 2019-08-16 NOTE — H&P (Addendum)
[facility] Admission History and Physical Service Pager: [phone]  Patient name: [name] Elliott Medical record number: [id number] Date of birth: Jan 16, 1928 Age: 85 y.o. Gender: male  Primary Care Provider: Rutherford Guys, MD Consultants: None Code Status: Partial code, no chest compressions, intubation with respiratory support is acceptable. Clarified with daughter x3 Preferred Emergency Contact: [name] (daughter)- [phone]  Chief Complaint: Fall  Assessment and Plan: [name] Elliott is a 85 y.o. male presenting with AKI and altered mental status after a fall. PMH is significant for vascular dementia, A. fib, CHF, hypertension, T2DM.  Altered mental status with hx of vascular dementia  Most recent MoCA evaluation score-12. Per PCP note from 02/06/16 patient recommended MOCA-Basic given his education level.   Patient was alert and oriented to self and place but not to time.  Per daughter the patient has become more more confused over the past couple of weeks, notes this week especially he was more confused. She normally visits almost daily but due to her positive COVID status, she has had to only call. CT head showed no acute intracranial abnormalities but did note atrophy and chronic microvascular ischemic change.  EKG showed sinus tachycardia with left anterior fascicular block and irregular rate.  WBC-12.6. Unclear etiology of acute AMS as daughter reports this is not his baseline. Differentials include infectious etiology given elevated WBC count, will await UA and cx prior to starting abx. Can consider uremia given elevated Cr and AKI, BUN also elevated to 85. Patient has been alone so unclear about intoxication, UDS and ethanol pending. Daughter reports he has not taken any medications all week. Will obtain liver function panel to r/o metabolic pathology. Can consider cardiac etiology, will place on telemetry. CT negative so unlikely stroke or hemorrhage.  Can also consider worsening of underlying dementia.  -admit to tele, attending Dr. Andria Frames  -NPO pending swallow eval, will start diet when passes  -TSH -AM CBC, BMP -holding home gabapentin  -bladder scan  -trend LA -CXR -Fall precautions -Ammonia level -Ethanol level -Hepatic function panel -Frequent neurochecks -Urinalysis & cx, consider abx if positive  -Up with assistance -PT/OT eval and treat  Recent fall Patient was reportedly at his home sitting in his chair with his daughter.  His daughter went to the other room while she was there she heard a loud noise.  She returned and found him on the floor.  He had apparently gotten up to use the restroom and fallen.  He was immediately responsive to the daughter and said that he did not want her to call 911.  She went to the other room to call 911 and when she returned he was intermittently responsive. He remained in one location until EMS arrived. On exam in the ED he had a head wound which was repaired with Dermabond.  Rib x-ray showed old bilateral rib fractures but no acute left rib fractures.  Left forearm showed no acute osseous abnormality. CT head negative. Cervical CT showed no fracture or acute finding.  -fall precautions  -PT/OT  AKI with CKD 3 Patient has history of CKD stage III with a baseline creatinine of approximately 1.6.  On presentation to the emergency department his creatinine was 3.42.  GFR also lowered to 15, BL in 30s. Patient has reportedly been noncompliant with any of his medications and his daughter who normally helps him around the house has not been able to do so over the past week due to her positive Covid test. Anticipate a  component of dehydration. On exam patient has dry mucous membranes.  He was given 500 mL bolus of fluid in the emergency department.  AKI is most likely due to dehydration.  Other causes on the differential include nephro toxic medications.  This is lower on the differential because because  the patient has been noncompliant with his medications. -Admit to inpatient teaching service with Dr. Andria Frames as attending -Consider nephrology consult tomorrow if worsening kidney function -Strict I's and O's -Bladder scan and consider and out Foley -Urinalysis & cx -hold home lasix -NS@50cc /h -Daily weights -Morning BMP -Avoid nephrotoxic agents  SOB with known Covid exposure Patient's daughter reports that she tested positive for Covid and today was her last day of quarantine.  She has remained isolated from the patient over the last week but reports that he was exposed to her before she knew she was Covid positive.  He was reportedly tested and came back negative but over the last week has been telling her over the phone that he is short of breath and is having body aches.  In the emergency room the antibody test was negative. -Continue droplet and contact precautions -Follow-up on Covid PCR test -Continuous pulse ox -airborne and contact precautions until repeat COVID returns -continuous pulse ox  -CXR  HFrEF Most recent echocardiogram was from 06/2016.  It showed LVEF of 40-45%.  Patient's home medications are Imdur 30 mg daily and Lasix 40 mg daily.  Patient has not taken his medications in over a week.  BNP on arrival is 118.8.   Patient does not appear extremely fluid overloaded on exam. -Holding Lasix at this time due to AKI -Continue home Imdur 30 mg -Strict I's and O's -Continuous cardiac monitoring -Consider repeat echocardiogram  Elevated troponins High-sensitivity troponins have been 69>73.  Elevation in high-sensitivity troponins is most likely due to demand ischemia.  -Continue to trend high-sensitivity troponins  A. fib Follows with [facility]. EKG on admission showed sinus tachycardia with left fascicular block and a regular rhythm.  A. fib is listed on his problem list but the daughter denies it and he is not on any rate control or anticoagulation.  Per chart  review patient was on anticoagulation at one point but had frequent GI bleeds which required ED visits so anticoagulation was discontinued. -monitor on telemetry  -am EKG  HTN Patient's blood pressure since admission have ranged from 110/58-149/88.  Home medications include Imdur 30 mg daily and Lasix 40 mg daily. -Monitor blood pressures -Vitals per routine -Continue Imdur 30 mg daily -Discontinue Lasix 40 mg daily given AKI  T2DM Glucose was 200 on arrival.  Most recent hemoglobin was 07/23/2019 and was 6.9.  Diabetes is managed by diet. -Monitor blood glucoses off morning BMP  Hypothyroidism Patient takes Synthroid at home.  He is prescribed 50 mg every Tuesday and Saturday and 75 mg all remaining days of the week.  Patient has been noncompliant with his medications over the past week. -TSH -Continue home Synthroid dosage schedule  FEN/GI: Heart healthy carb modified Prophylaxis: Heparin GGT  Disposition: Admit to inpatient teaching service  History of Present Illness:  [name] Elliott is a 84 y.o. male presenting after a fall at home.  The patient's daughter was in the house when the fall occurred.  The patient reportedly got up to use the restroom while the patient's daughter was in the other room.  The patient's daughter heard a loud noise and found him in the floor.  He was immediately  responsive and when she said she was going to call 911 he said "no".  Patient's daughter went to call 911 and she returned to the patient's side.  She reports that the patient was intermittently responsive and remained on the floor until EMS arrived.  Of note, the patient lives at home by himself.  Patient's daughter manages his medications but she was diagnosed with Covid over a week ago and her quarantine time ended today.  The patients daughter reports that he has been complaining of body aches and shortness of breath over the past week.  He was tested for Covid shortly after she was diagnosed  because he was exposed and he tested negative.  She discussed this with his primary who said it may be that he had yet to develop a viral load high enough to test positive for Covid.  The patient's daughter also wanted to point out that because she has not been giving him his medications daily over the past week he has not taken any of his medications for at least the past 7 days.   Regarding the patient's mental status the patient's daughter reports that she has noticed increased confusion over the past few weeks.  She points out occurrences where she named people that the patient should know or new in the past and he reports that he does not know who they are.  Review Of Systems: Per HPI with the following additions:  Patient is altered at this time and unable to provide a review of systems.  Limited review of systems provided by patient's daughter given she has been unable to see him because of her positive Covid diagnosis. Review of Systems  Unable to perform ROS: Mental status change  Constitutional: Negative for fever and malaise/fatigue.  Respiratory: Positive for shortness of breath.   Musculoskeletal: Positive for falls and myalgias.  Neurological: Positive for weakness.    Patient Active Problem List   Diagnosis Date Noted  . Chronic combined systolic and diastolic CHF (congestive heart failure) ([place]) 03/30/2019  . Cellulitis, leg 03/29/2019  . Memory loss 12/05/2017  . Orthostatic dizziness 12/05/2017  . Idiopathic peripheral neuropathy 12/05/2017  . PAF (paroxysmal atrial fibrillation) ([place]) 04/30/2017  . Shortness of breath 06/04/2016  . Chronic systolic CHF (congestive heart failure) ([place]) 06/04/2016  . Bilateral leg edema 12/12/2015  . Dizziness 09/27/2015  . Primary localized osteoarthritis of left knee 07/12/2015  . Weakness 02/23/2015  . DVT (deep venous thrombosis) ([place]) 12/14/2014  . Combined congestive systolic and diastolic heart failure ([place]) 12/14/2014  .  Cerebral infarction due to unspecified mechanism   . TIA (transient ischemic attack) 08/01/2014  . Varicose veins of both legs with edema 05/07/2014  . Bilateral leg pain 04/16/2014  . Chronic kidney disease, stage III (moderate) ([place]) 12/21/2013  . Vascular dementia without behavioral disturbance ([place])   . Type 2 diabetes mellitus, controlled, with renal complications ([place]) 123456  . Diastolic dysfunction [id number]  . Knee pain, bilateral 03/01/2013  . Hereditary and idiopathic peripheral neuropathy 11/26/2012  . Osteoporosis, unspecified 11/26/2012  . GI bleed due to NSAIDs, DDX=Isch colitis, infectious colitis 11/19/2012  . Cough 05/29/2012  . 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 CONSTIPATION, CHRONIC 08/02/2010  . INSOMNIA, CHRONIC 03/08/2010  . Hypothyroidism 01/04/2010  . VITAMIN B12 DEFICIENCY 01/04/2010  . HYPERCHOLESTEROLEMIA 08/12/2009  . Essential hypertension 08/12/2009  . CAD (coronary artery disease) 08/12/2009  . GERD 08/12/2009    Past Medical History: Past Medical History:  Diagnosis Date  .  Arthritis   . Arthropathy, unspecified, site unspecified   . Atrial fibrillation ([place])   . Blood dyscrasia    thromboctopenia pt family states he was never told of this  . CAD (coronary artery disease)    last cath in 2012. Managed medically-some blockages  . Chronic lower back pain   . Chronic systolic CHF (congestive heart failure) ([place]) 06/04/2016  . Constipation, chronic   . Diabetes mellitus without complication ([place])    pt states he doesn't have  . DVT (deep venous thrombosis) ([place])   . Dysrhythmia   . Esophageal reflux   . 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 MRI Nov '11 - tendonosis and partial tear. Left shoulder surgery Jan '12 for partial shoulder replacement. [name])   . Headache(784.0)   . History of stomach ulcers 11/2012  . Hypothyroidism    pt states he doeen't have   . Orthostasis   . Other  B-complex deficiencies   . Other malaise and fatigue   . Pain in joint, shoulder region    Has chronic shoulder pain  . Pain in limb   . Persistent disorder of initiating or maintaining sleep   . Pneumonia 1980's?; 2011  . Primary localized osteoarthritis of left knee 07/12/2015  . Pure hypercholesterolemia   . PVC's (premature ventricular contractions)   . S/P cardiac cath 08/08/11   mild to moderate CAD primarily in the LAD. None are obstructive and appear stable from prior cath in 2007; managed medically  . Sebaceous cyst   . Stroke ([place])   . Thrombocytopenia, unspecified ([place])   . Unspecified essential hypertension   . Vascular dementia without behavioral disturbance ([facility])    with periods of amnesia    Past Surgical History: Past Surgical History:  Procedure Laterality Date  . BACK SURGERY    . CARDIAC CATHETERIZATION  08/2011  . CATARACT EXTRACTION Left 2009  . COLONOSCOPY    . ESOPHAGOGASTRODUODENOSCOPY N/A 11/19/2012   Procedure: ESOPHAGOGASTRODUODENOSCOPY (EGD);  Surgeon: Jerene Bears, MD;  Location: [facility];  Service: Gastroenterology;  Laterality: N/A;  . FINGER AMPUTATION Right    pinky finger  . HAND SURGERY Right    crush injury, right fifth digit contracture, limited  motion  . HARDWARE REMOVAL  02/05/2012   Procedure: HARDWARE REMOVAL;  Surgeon: Johnny Bridge, MD;  Location: [facility];  Service: Orthopedics;  Laterality: Left;  . KNEE SURGERY Left 1991   "did it twice in 1 wk" (02/17/2013)  . LUMBAR SPINE SURGERY  2007   Dr Philip Aspen ([place])  . REVERSE SHOULDER ARTHROPLASTY  02/05/2012   Procedure: REVERSE SHOULDER ARTHROPLASTY;  Surgeon: Johnny Bridge, MD;  Location: [facility];  Service: Orthopedics;  Laterality: Left;  . SHOULDER HEMI-ARTHROPLASTY    . SHOULDER OPEN ROTATOR CUFF REPAIR Left 8.30.2011  . TOTAL KNEE ARTHROPLASTY Left 07/12/2015   Procedure: TOTAL KNEE ARTHROPLASTY;  Surgeon: Marchia Bond, MD;  Location: [facility];  Service: Orthopedics;   Laterality: Left;  . US ECHOCARDIOGRAPHY  02-28-2010   Est EF 50-55%    Social History: Social History   Tobacco Use  . Smoking status: Former Smoker    Types: Cigars    Quit date: 09/03/1978    Years since quitting: 40.9  . Smokeless tobacco: Current User    Types: Chew  . Tobacco comment: 02/17/2013 "I aien't smoked a cigar in 20-30 yr"  Substance Use Topics  . Alcohol use: No    Alcohol/week: 0.0 standard drinks  . Drug  use: No   Additional social history:  Please also refer to relevant sections of EMR.  Family History: Family History  Problem Relation Age of Onset  . Breast cancer Mother   . Cancer Mother   . Diabetes Mother   . Cancer Brother        throat  . Stroke Brother   . Kidney disease Father   . Diabetes Sister   . Diabetes Daughter   . Colon cancer Neg Hx   . Heart attack Neg Hx     Allergies and Medications: Allergies  Allergen Reactions  . Crestor [Rosuvastatin Calcium] Other (See Comments)    Muscle pain/aches  . Mestinon [Pyridostigmine Bromide Er] Other (See Comments)    GI upset, stomach cramps  . Xarelto [Rivaroxaban] Other (See Comments)    bleeding   Current Facility-Administered Medications on File Prior to Encounter  Medication Dose Route Frequency Provider Last Rate Last Admin  . cyanocobalamin ((VITAMIN B-12)) injection 1,000 mcg  1,000 mcg Intramuscular Q30 days Rutherford Guys, MD   1,000 mcg at 05/14/19 [id number]   Current Outpatient Medications on File Prior to Encounter  Medication Sig Dispense Refill  . acetaminophen (TYLENOL) 500 MG tablet Take 1,000 mg by mouth every 6 (six) hours as needed for mild pain.    Marland Kitchen aspirin EC 81 MG tablet Take 81 mg by mouth daily.     . Aspirin-Acetaminophen-Caffeine (GOODY HEADACHE PO) Take 1 Package by mouth as needed (headache).    . bismuth subsalicylate (PEPTO BISMOL) 262 MG/15ML suspension Take 30 mLs by mouth daily as needed for indigestion or diarrhea or loose stools.     . docusate sodium  (COLACE) 100 MG capsule Take 1 capsule (100 mg total) by mouth every 12 (twelve) hours. (Patient taking differently: Take 100 mg by mouth daily as needed for mild constipation. ) 30 capsule 0  . famotidine (PEPCID) 20 MG tablet Take 1 tablet (20 mg total) by mouth 2 (two) times daily. 30 tablet 2  . furosemide (LASIX) 40 MG tablet Take 1 tablet (40 mg total) by mouth daily. 90 tablet 3  . gabapentin (NEURONTIN) 300 MG capsule Take 2 capsules (600 mg total) by mouth 3 (three) times daily. 540 capsule 0  . isosorbide mononitrate (IMDUR) 30 MG 24 hr tablet Take 1 tablet (30 mg total) by mouth daily. 90 tablet 3  . levothyroxine (SYNTHROID) 50 MCG tablet Take 1 tablet (50 mcg) every Tuesday and Saturday morning before breakfast. Take 75 mcg on all other days. 90 tablet 0  . levothyroxine (SYNTHROID) 75 MCG tablet Take 1 tablet (91mcg) every Monday, Wednesday, Thursday, Friday and Sunday morning before breakfast. Take 50 mcg on all other days 90 tablet 0  . magnesium hydroxide (MILK OF MAGNESIA) 400 MG/5ML suspension Take 15 mLs by mouth daily as needed for mild constipation or moderate constipation.    . nitroGLYCERIN (NITROSTAT) 0.4 MG SL tablet Place 1 tablet (0.4 mg total) under the tongue every 5 (five) minutes as needed for chest pain. 25 tablet 6  . omeprazole (PRILOSEC) 20 MG capsule Take 1 capsule (20 mg total) by mouth 2 (two) times daily before a meal. 180 capsule 3  . potassium chloride SA (KLOR-CON) 20 MEQ tablet Take 1 tablet (20 mEq total) by mouth 2 (two) times daily. 180 tablet 0  . diclofenac sodium (VOLTAREN) 1 % GEL Apply 2 g topically 4 (four) times daily. (Patient not taking: Reported on 08/16/2019) 100 g 0  . lidocaine (LIDODERM) 5 %  Place 1 patch onto the skin daily. Remove & Discard patch within 12 hours or as directed by MD (Patient not taking: Reported on 08/16/2019) 30 patch 0  . pantoprazole (PROTONIX) 20 MG tablet Take 2 tablets (40 mg total) by mouth 2 (two) times daily for 14  days. (Patient not taking: Reported on 08/16/2019) 56 tablet 0    Objective: BP (!) 141/57   Pulse 98   Temp 97.6 F (36.4 C) (Oral)   Resp 15   SpO2 97%  Physical Exam  Constitutional: No distress.  HENT:  Head: Normocephalic.  Patient has small laceration to his left temporal area that has been repaired with Dermabond.  Oropharynx is relatively dry.  Eyes: Pupils are equal, round, and reactive to light. Conjunctivae and EOM are normal. Right eye exhibits no discharge. Left eye exhibits no discharge. No scleral icterus.  Cardiovascular:  Murmur heard. Tachycardic heart rate with irregular rhythm  Pulmonary/Chest: Effort normal. Wheezes: Mild expiratory wheezes in left lung fields.  Abdominal: Soft. Bowel sounds are normal. He exhibits no distension. There is no abdominal tenderness.  Musculoskeletal:        General: Edema (In lower extremities bilaterally) present.     Cervical back: Normal range of motion.     Comments: Patient also has bandage left forearm  Neurological: No cranial nerve deficit.  Patient is alert and oriented to person and place but not time.  He reports that the year is 1960s.  When asked why he is at the hospital he says that he is not quite sure but he knows that he fell  Skin: Skin is warm and dry. He is not diaphoretic.    Labs and Imaging: CBC BMET  Recent Labs  Lab 08/16/19 1217  WBC 12.6*  HGB 15.9  HCT 48.3  PLT 194   Recent Labs  Lab 08/16/19 1217  NA 136  K 4.3  CL 98  CO2 19*  BUN 85*  CREATININE 3.42*  GLUCOSE 200*  CALCIUM 9.1    BNP-118.8 Troponin (high-sensitivity)-69> EKG: Sinus tachycardia with left anterior fascicular block and irregular rate  DG Ribs Unilateral W/Chest Left  Result Date: 08/16/2019 CLINICAL DATA:  Left rib pain. Unwitnessed fall. EXAM: LEFT RIBS AND CHEST - 3+ VIEW COMPARISON:  August 03, 2019. FINDINGS: Old bilateral rib fractures are noted. No acute left rib fracture is noted. There is no evidence  of pneumothorax or pleural effusion. Both lungs are clear. Heart size and mediastinal contours are within normal limits. IMPRESSION: No acute cardiopulmonary abnormality seen. Old bilateral rib fractures are noted. No acute left rib fracture is noted. Electronically Signed   By: Marijo Conception M.D.   On: 08/16/2019 14:04   DG Forearm Left  Result Date: 08/16/2019 CLINICAL DATA:  Pt has dementia but lives by himself, here via [facility]. Pt's daughter has covid and he was tested thurs and was neg. Pt was found on ground, unknown down time. Pt has lac to L forehead, skin tear to L forearm and wrapped in gauze EXAM: LEFT FOREARM - 2 VIEW COMPARISON:  Left elbow radiographs 05/13/2010 FINDINGS: There is no evidence of fracture or other focal bone lesions. No evidence of dislocation. Osteopenia. There are a few nonspecific calcified densities in the posterior elbow which were present in 2011. IMPRESSION: No acute osseous abnormality in the left forearm. Electronically Signed   By: Audie Pinto M.D.   On: 08/16/2019 14:05   CT Head Wo Contrast  Result Date: 08/16/2019 CLINICAL DATA:  Found on ground. Laceration to the left forehead. Headache. EXAM: CT HEAD WITHOUT CONTRAST CT CERVICAL SPINE WITHOUT CONTRAST TECHNIQUE: Multidetector CT imaging of the head and cervical spine was performed following the standard protocol without intravenous contrast. Multiplanar CT image reconstructions of the cervical spine were also generated. COMPARISON:  Head CT, 08/01/2014. FINDINGS: CT HEAD FINDINGS Brain: No evidence of acute infarction, hemorrhage, hydrocephalus, extra-axial collection or mass lesion/mass effect. There is ventricular and sulcal enlargement reflecting mild diffuse atrophy. Widened extra-axial spaces are noted also reflecting volume loss. Patchy periventricular white matter hypoattenuation is present consistent with moderate chronic microvascular ischemic change, mildly advanced from prior head CT. Atrophy is  also increased. Vascular: No hyperdense vessel or unexpected calcification. Skull: Normal. Negative for fracture or focal lesion. Sinuses/Orbits: Globes and orbits are unremarkable. Sinuses and mastoid air cells are clear. Other: None. CT CERVICAL SPINE FINDINGS Alignment: Normal. Skull base and vertebrae: No acute fracture. No primary bone lesion or focal pathologic process. Soft tissues and spinal canal: No prevertebral fluid or swelling. No visible canal hematoma. Disc levels: Moderate loss of disc height at C4-C5, C5-C6 and C6-C7 with mild spondylotic disc bulging. No disc herniation or significant stenosis. Upper chest: No acute findings.  Clear lung apices. Other: None. IMPRESSION: HEAD CT 1. No acute intracranial abnormalities. 2. Atrophy and chronic microvascular ischemic change. CERVICAL CT 1. No fracture or acute finding. Electronically Signed   By: Lajean Manes M.D.   On: 08/16/2019 13:55   CT Cervical Spine Wo Contrast  Result Date: 08/16/2019 CLINICAL DATA:  Found on ground. Laceration to the left forehead. Headache. EXAM: CT HEAD WITHOUT CONTRAST CT CERVICAL SPINE WITHOUT CONTRAST TECHNIQUE: Multidetector CT imaging of the head and cervical spine was performed following the standard protocol without intravenous contrast. Multiplanar CT image reconstructions of the cervical spine were also generated. COMPARISON:  Head CT, 08/01/2014. FINDINGS: CT HEAD FINDINGS Brain: No evidence of acute infarction, hemorrhage, hydrocephalus, extra-axial collection or mass lesion/mass effect. There is ventricular and sulcal enlargement reflecting mild diffuse atrophy. Widened extra-axial spaces are noted also reflecting volume loss. Patchy periventricular white matter hypoattenuation is present consistent with moderate chronic microvascular ischemic change, mildly advanced from prior head CT. Atrophy is also increased. Vascular: No hyperdense vessel or unexpected calcification. Skull: Normal. Negative for fracture  or focal lesion. Sinuses/Orbits: Globes and orbits are unremarkable. Sinuses and mastoid air cells are clear. Other: None. CT CERVICAL SPINE FINDINGS Alignment: Normal. Skull base and vertebrae: No acute fracture. No primary bone lesion or focal pathologic process. Soft tissues and spinal canal: No prevertebral fluid or swelling. No visible canal hematoma. Disc levels: Moderate loss of disc height at C4-C5, C5-C6 and C6-C7 with mild spondylotic disc bulging. No disc herniation or significant stenosis. Upper chest: No acute findings.  Clear lung apices. Other: None. IMPRESSION: HEAD CT 1. No acute intracranial abnormalities. 2. Atrophy and chronic microvascular ischemic change. CERVICAL CT 1. No fracture or acute finding. Electronically Signed   By: Lajean Manes M.D.   On: 08/16/2019 13:55    Gifford Shave, MD 08/16/2019, 3:40 PM PGY-1, [place] Intern pager: [phone], text pages welcome

## 2019-08-16 NOTE — Progress Notes (Signed)
New Admission Note:   Arrival Method: Arrived from [facility] ED via stretcher Mental Orientation: Alert and oriented to person Telemetry: Box#1 Assessment: Completed Skin: See doc flowsheet IV: Rt AC Pain: Denies Tubes: N/A Safety Measures: Safety Fall Prevention Plan has been discussed.  Admission: Completed 5MW Orientation: Patient has been orientated to the room, unit and staff.  Family: None at bedside  Orders have been reviewed and implemented. Will continue to monitor the patient. Call light has been placed within reach and bed alarm has been activated.   Norman Piacentini [employer], RN-BC Phone number: [phone]

## 2019-08-16 NOTE — ED Triage Notes (Signed)
Pt has dementia but lives by himself, here via [facility].  Pt's daughter has covid and he was tested thurs and was neg.  Pt was found on ground, unknown down time.  Pt has lac to L forehead, skin tear to L FA.  Pt keeps c/o exertional sob, but sats are 96 and pt does not seem in respiratory distress.  cbg 261  134/64 69 hr 96% 16 rr

## 2019-08-16 NOTE — Progress Notes (Signed)
Received report from ED RN. Room ready for patient. Neely Kammerer Joselita, RN 

## 2019-08-16 NOTE — ED Notes (Addendum)
Admission team at bedside, will return to perform wound care after MD exam. Urinal at bedside.

## 2019-08-16 NOTE — ED Provider Notes (Signed)
[facility] EMERGENCY DEPARTMENT Provider Note   CSN: [id number] Arrival date & time: 08/16/19  1143     History Chief Complaint  Patient presents with  . Covid/fall   LEVEL 5 CAVEAT - DEMENTIA  [name] Elliott is a 85 y.o. male with PMHx vascular dementia, A fib on 81 mg ASA, CAD s/p stents, CHF with ED 40-45%, stroke not currently anticoagulated who presents to the ED via EMS for ?mechanical fall that occurred just PTA.  EMS patient unable to give much history with his severe dementia.  Daughter called EMS after she found him on the ground while he was on the way to the bathroom.  He is currently complaining of pain to his head.  No other complaints at this time.  Patient is not anticoagulated.  Does have skin tear noted to left forearm, tetanus up-to-date.   Per daughter she reports that for the past week he is felt generally weak with shortness of breath as well as intermittent chest pain.  Does report that she is currently Covid positive and was tested on 11/30.  She states that patient was tested that same day as well which was negative but was told that it it could be too early and patient may need to be retested depending on symptoms.  She states she has not seen him since then until today when her quarantine was up.  While she was over at his house he was sitting on the couch and she was elsewhere.  She heard a loud thump and then patient crying out for help and found him on the floor.  Patient did hit his head but she does not think he lost consciousness.  He had no other complaints with her prior to EMS being called.  Unsure if patient has had any fevers for the past week as she has not been around him.  Patient does live alone and she typically checks on him.  The history is provided by the patient, the EMS personnel and a relative. The history is limited by a developmental delay.       Past Medical History:  Diagnosis Date  . Arthritis   . Arthropathy,  unspecified, site unspecified   . Atrial fibrillation ([place])   . Blood dyscrasia    thromboctopenia pt family states he was never told of this  . CAD (coronary artery disease)    last cath in 2012. Managed medically-some blockages  . Chronic lower back pain   . Chronic systolic CHF (congestive heart failure) ([place]) 06/04/2016  . Constipation, chronic   . Diabetes mellitus without complication ([place])    pt states he doesn't have  . DVT (deep venous thrombosis) ([place])   . Dysrhythmia   . Esophageal reflux   . 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 MRI Nov '11 - tendonosis and partial tear. Left shoulder surgery Jan '12 for partial shoulder replacement. [name])   . Headache(784.0)   . History of stomach ulcers 11/2012  . Hypothyroidism    pt states he doeen't have   . Orthostasis   . Other B-complex deficiencies   . Other malaise and fatigue   . Pain in joint, shoulder region    Has chronic shoulder pain  . Pain in limb   . Persistent disorder of initiating or maintaining sleep   . Pneumonia 1980's?; 2011  . Primary localized osteoarthritis of left knee 07/12/2015  . Pure hypercholesterolemia   . PVC's (premature ventricular contractions)   .  S/P cardiac cath 08/08/11   mild to moderate CAD primarily in the LAD. None are obstructive and appear stable from prior cath in 2007; managed medically  . Sebaceous cyst   . Stroke ([place])   . Thrombocytopenia, unspecified ([place])   . Unspecified essential hypertension   . Vascular dementia without behavioral disturbance ([place])    with periods of amnesia    Patient Active Problem List   Diagnosis Date Noted  . AKI (acute kidney injury) ([place]) 08/16/2019  . Chronic combined systolic and diastolic CHF (congestive heart failure) ([place]) 03/30/2019  . Cellulitis, leg 03/29/2019  . Memory loss 12/05/2017  . Orthostatic dizziness 12/05/2017  . Idiopathic peripheral neuropathy 12/05/2017  . PAF (paroxysmal atrial  fibrillation) ([place]) 04/30/2017  . Shortness of breath 06/04/2016  . Chronic systolic CHF (congestive heart failure) ([place]) 06/04/2016  . Bilateral leg edema 12/12/2015  . Dizziness 09/27/2015  . Primary localized osteoarthritis of left knee 07/12/2015  . Weakness 02/23/2015  . DVT (deep venous thrombosis) ([place]) 12/14/2014  . Combined congestive systolic and diastolic heart failure ([place]) 12/14/2014  . Cerebral infarction due to unspecified mechanism   . TIA (transient ischemic attack) 08/01/2014  . Varicose veins of both legs with edema 05/07/2014  . Bilateral leg pain 04/16/2014  . Chronic kidney disease, stage III (moderate) ([place]) 12/21/2013  . Vascular dementia without behavioral disturbance ([place])   . Type 2 diabetes mellitus, controlled, with renal complications ([place]) 123456  . Diastolic dysfunction 123XX123  . Knee pain, bilateral 03/01/2013  . Hereditary and idiopathic peripheral neuropathy 11/26/2012  . Osteoporosis, unspecified 11/26/2012  . GI bleed due to NSAIDs, DDX=Isch colitis, infectious colitis 11/19/2012  . Cough 05/29/2012  . 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 CONSTIPATION, CHRONIC 08/02/2010  . INSOMNIA, CHRONIC 03/08/2010  . Hypothyroidism 01/04/2010  . VITAMIN B12 DEFICIENCY 01/04/2010  . HYPERCHOLESTEROLEMIA 08/12/2009  . Essential hypertension 08/12/2009  . CAD (coronary artery disease) 08/12/2009  . GERD 08/12/2009    Past Surgical History:  Procedure Laterality Date  . BACK SURGERY    . CARDIAC CATHETERIZATION  08/2011  . CATARACT EXTRACTION Left 2009  . COLONOSCOPY    . ESOPHAGOGASTRODUODENOSCOPY N/A 11/19/2012   Procedure: ESOPHAGOGASTRODUODENOSCOPY (EGD);  Surgeon: Jerene Bears, MD;  Location: [employer] [facility];  Service: Gastroenterology;  Laterality: N/A;  . FINGER AMPUTATION Right    pinky finger  . HAND SURGERY Right    crush injury, right fifth digit contracture, limited  motion  . HARDWARE REMOVAL  02/05/2012   Procedure:  HARDWARE REMOVAL;  Surgeon: Johnny Bridge, MD;  Location: [facility];  Service: Orthopedics;  Laterality: Left;  . KNEE SURGERY Left 1991   "did it twice in 1 wk" (02/17/2013)  . LUMBAR SPINE SURGERY  2007   Dr Philip Aspen ([place])  . REVERSE SHOULDER ARTHROPLASTY  02/05/2012   Procedure: REVERSE SHOULDER ARTHROPLASTY;  Surgeon: Johnny Bridge, MD;  Location: [facility];  Service: Orthopedics;  Laterality: Left;  . SHOULDER HEMI-ARTHROPLASTY    . SHOULDER OPEN ROTATOR CUFF REPAIR Left 8.30.2011  . TOTAL KNEE ARTHROPLASTY Left 07/12/2015   Procedure: TOTAL KNEE ARTHROPLASTY;  Surgeon: Marchia Bond, MD;  Location: [facility];  Service: Orthopedics;  Laterality: Left;  . US ECHOCARDIOGRAPHY  02-28-2010   Est EF 50-55%       Family History  Problem Relation Age of Onset  . Breast cancer Mother   . Cancer Mother   . Diabetes Mother   . Cancer Brother  throat  . Stroke Brother   . Kidney disease Father   . Diabetes Sister   . Diabetes Daughter   . Colon cancer Neg Hx   . Heart attack Neg Hx     Social History   Tobacco Use  . Smoking status: Former Smoker    Types: Cigars    Quit date: 09/03/1978    Years since quitting: 40.9  . Smokeless tobacco: Current User    Types: Chew  . Tobacco comment: 02/17/2013 "I aien't smoked a cigar in 20-30 yr"  Substance Use Topics  . Alcohol use: No    Alcohol/week: 0.0 standard drinks  . Drug use: No    Home Medications Prior to Admission medications   Medication Sig Start Date End Date Taking? Authorizing Provider  acetaminophen (TYLENOL) 500 MG tablet Take 1,000 mg by mouth every 6 (six) hours as needed for mild pain.   Yes [provider]  aspirin EC 81 MG tablet Take 81 mg by mouth daily.    Yes [provider]  Aspirin-Acetaminophen-Caffeine (GOODY HEADACHE PO) Take 1 Package by mouth as needed (headache).   Yes [provider]  bismuth subsalicylate (PEPTO BISMOL) 262 MG/15ML suspension Take 30 mLs by mouth  daily as needed for indigestion or diarrhea or loose stools.    Yes [provider]  docusate sodium (COLACE) 100 MG capsule Take 1 capsule (100 mg total) by mouth every 12 (twelve) hours. Patient taking differently: Take 100 mg by mouth daily as needed for mild constipation.  07/29/18  Yes Harris, Abigail, PA-C  famotidine (PEPCID) 20 MG tablet Take 1 tablet (20 mg total) by mouth 2 (two) times daily. 07/23/19  Yes Rutherford Guys, MD  furosemide (LASIX) 40 MG tablet Take 1 tablet (40 mg total) by mouth daily. 07/03/18  Yes Nahser, Wonda Cheng, MD  gabapentin (NEURONTIN) 300 MG capsule Take 2 capsules (600 mg total) by mouth 3 (three) times daily. 07/23/19  Yes Rutherford Guys, MD  isosorbide mononitrate (IMDUR) 30 MG 24 hr tablet Take 1 tablet (30 mg total) by mouth daily. 10/02/18  Yes Nahser, Wonda Cheng, MD  levothyroxine (SYNTHROID) 50 MCG tablet Take 1 tablet (50 mcg) every Tuesday and Saturday morning before breakfast. Take 75 mcg on all other days. 07/27/19  Yes Rutherford Guys, MD  levothyroxine (SYNTHROID) 75 MCG tablet Take 1 tablet (9mcg) every Monday, Wednesday, Thursday, Friday and Sunday morning before breakfast. Take 50 mcg on all other days 07/27/19  Yes Rutherford Guys, MD  magnesium hydroxide (MILK OF MAGNESIA) 400 MG/5ML suspension Take 15 mLs by mouth daily as needed for mild constipation or moderate constipation.   Yes [provider]  nitroGLYCERIN (NITROSTAT) 0.4 MG SL tablet Place 1 tablet (0.4 mg total) under the tongue every 5 (five) minutes as needed for chest pain. 07/03/18  Yes Nahser, Wonda Cheng, MD  omeprazole (PRILOSEC) 20 MG capsule Take 1 capsule (20 mg total) by mouth 2 (two) times daily before a meal. 07/01/18  Yes McVey, Gelene Mink, PA-C  potassium chloride SA (KLOR-CON) 20 MEQ tablet Take 1 tablet (20 mEq total) by mouth 2 (two) times daily. 07/23/19  Yes Rutherford Guys, MD  diclofenac sodium (VOLTAREN) 1 % GEL Apply 2 g topically 4 (four)  times daily. Patient not taking: Reported on 08/16/2019 08/04/18   Rutherford Guys, MD  lidocaine (LIDODERM) 5 % Place 1 patch onto the skin daily. Remove & Discard patch within 12 hours or as directed by  MD Patient not taking: Reported on 08/16/2019 09/04/18   Rutherford Guys, MD  pantoprazole (PROTONIX) 20 MG tablet Take 2 tablets (40 mg total) by mouth 2 (two) times daily for 14 days. Patient not taking: Reported on 08/16/2019 08/03/19 08/17/19  Gareth Morgan, MD    Allergies    Crestor [rosuvastatin calcium], Mestinon [pyridostigmine bromide er], and Xarelto [rivaroxaban]  Review of Systems   Review of Systems  Unable to perform ROS: Dementia  Respiratory: Positive for shortness of breath.   Skin: Positive for wound.  Neurological: Positive for headaches. Negative for syncope.    Physical Exam Updated Vital Signs BP (!) 149/88 (BP Location: Right Arm)   Pulse 82   Temp 97.6 F (36.4 C) (Oral)   Resp 18   Physical Exam Vitals and nursing note reviewed.  Constitutional:      Appearance: He is not ill-appearing or diaphoretic.  HENT:     Head: Normocephalic.     Comments: Crescent shaped 1 cm superficial laceration noted to left forehead without active bleeding. No raccoon's sign or battle's sign. Negative hemotympanum bilaterally.  Eyes:     Conjunctiva/sclera: Conjunctivae normal.  Cardiovascular:     Rate and Rhythm: Normal rate and regular rhythm.     Pulses: Normal pulses.  Pulmonary:     Effort: Pulmonary effort is normal.     Breath sounds: Normal breath sounds. No wheezing, rhonchi or rales.     Comments: Left lateral rib TTP; no crepitus Chest:     Chest wall: Tenderness present.  Abdominal:     Palpations: Abdomen is soft.     Tenderness: There is no abdominal tenderness. There is no guarding or rebound.  Musculoskeletal:     Cervical back: Neck supple.     Right lower leg: Edema present.     Left lower leg: Edema present.     Comments: No C, T, or L  midline spinal tenderness.  1+ pitting edema bilaterally Skin tear noted to left mid forearm with bandage in place; no obvious TTP. ROM intact throughout wrist, elbow, and shoulder on left side without pain illicited. 2+ radial pulse.  No leg shortening or external rotation noted bilaterally. No hip TTP.   Skin:    General: Skin is warm and dry.  Neurological:     Mental Status: He is alert.     Cranial Nerves: Cranial nerves are intact.     Sensory: Sensation is intact.     Motor: Motor function is intact.     Comments: Alert to self and place. Unsure of the year or the month but is understanding that Christmas is in the near future.      ED Results / Procedures / Treatments   Labs (all labs ordered are listed, but only abnormal results are displayed) Labs Reviewed  BRAIN NATRIURETIC PEPTIDE - Abnormal; Notable for the following components:      Result Value   B Natriuretic Peptide 118.8 (*)    All other components within normal limits  BASIC METABOLIC PANEL - Abnormal; Notable for the following components:   CO2 19 (*)    Glucose, Bld 200 (*)    BUN 85 (*)    Creatinine, Ser 3.42 (*)    GFR calc non Af Amer 15 (*)    GFR calc Af Amer 17 (*)    Anion gap 19 (*)    All other components within normal limits  CBC WITH DIFFERENTIAL/PLATELET - Abnormal; Notable for the following components:  WBC 12.6 (*)    Neutro Abs 10.6 (*)    Monocytes Absolute 1.1 (*)    Abs Immature Granulocytes 0.08 (*)    All other components within normal limits  TROPONIN I (HIGH SENSITIVITY) - Abnormal; Notable for the following components:   Troponin I (High Sensitivity) 69 (*)    All other components within normal limits  TROPONIN I (HIGH SENSITIVITY) - Abnormal; Notable for the following components:   Troponin I (High Sensitivity) 73 (*)    All other components within normal limits  SARS CORONAVIRUS 2 (TAT 6-24 HRS)  URINALYSIS, ROUTINE W REFLEX MICROSCOPIC  POC SARS CORONAVIRUS 2 AG -  ED     EKG EKG Interpretation  Date/Time:  Sunday August 16 2019 12:03:06 EST Ventricular Rate:  126 PR Interval:    QRS Duration: 99 QT Interval:  331 QTC Calculation: 480 R Axis:   -77 Text Interpretation: Sinus tachycardia with irregular rate Left anterior fascicular block Abnormal R-wave progression, late transition Borderline T abnormalities, anterior leads Borderline prolonged QT interval rate is faster compared to Nov 2020 Confirmed by Sherwood Gambler [phone]) on 08/16/2019 12:04:32 PM   Radiology DG Ribs Unilateral W/Chest Left  Result Date: 08/16/2019 CLINICAL DATA:  Left rib pain. Unwitnessed fall. EXAM: LEFT RIBS AND CHEST - 3+ VIEW COMPARISON:  August 03, 2019. FINDINGS: Old bilateral rib fractures are noted. No acute left rib fracture is noted. There is no evidence of pneumothorax or pleural effusion. Both lungs are clear. Heart size and mediastinal contours are within normal limits. IMPRESSION: No acute cardiopulmonary abnormality seen. Old bilateral rib fractures are noted. No acute left rib fracture is noted. Electronically Signed   By: Marijo Conception M.D.   On: 08/16/2019 14:04   DG Forearm Left  Result Date: 08/16/2019 CLINICAL DATA:  Pt has dementia but lives by himself, here via [facility]. Pt's daughter has covid and he was tested thurs and was neg. Pt was found on ground, unknown down time. Pt has lac to L forehead, skin tear to L forearm and wrapped in gauze EXAM: LEFT FOREARM - 2 VIEW COMPARISON:  Left elbow radiographs 05/13/2010 FINDINGS: There is no evidence of fracture or other focal bone lesions. No evidence of dislocation. Osteopenia. There are a few nonspecific calcified densities in the posterior elbow which were present in 2011. IMPRESSION: No acute osseous abnormality in the left forearm. Electronically Signed   By: Audie Pinto M.D.   On: 08/16/2019 14:05   CT Head Wo Contrast  Result Date: 08/16/2019 CLINICAL DATA:  Found on ground. Laceration to the  left forehead. Headache. EXAM: CT HEAD WITHOUT CONTRAST CT CERVICAL SPINE WITHOUT CONTRAST TECHNIQUE: Multidetector CT imaging of the head and cervical spine was performed following the standard protocol without intravenous contrast. Multiplanar CT image reconstructions of the cervical spine were also generated. COMPARISON:  Head CT, 08/01/2014. FINDINGS: CT HEAD FINDINGS Brain: No evidence of acute infarction, hemorrhage, hydrocephalus, extra-axial collection or mass lesion/mass effect. There is ventricular and sulcal enlargement reflecting mild diffuse atrophy. Widened extra-axial spaces are noted also reflecting volume loss. Patchy periventricular white matter hypoattenuation is present consistent with moderate chronic microvascular ischemic change, mildly advanced from prior head CT. Atrophy is also increased. Vascular: No hyperdense vessel or unexpected calcification. Skull: Normal. Negative for fracture or focal lesion. Sinuses/Orbits: Globes and orbits are unremarkable. Sinuses and mastoid air cells are clear. Other: None. CT CERVICAL SPINE FINDINGS Alignment: Normal. Skull base and vertebrae: No acute fracture. No primary bone lesion or  focal pathologic process. Soft tissues and spinal canal: No prevertebral fluid or swelling. No visible canal hematoma. Disc levels: Moderate loss of disc height at C4-C5, C5-C6 and C6-C7 with mild spondylotic disc bulging. No disc herniation or significant stenosis. Upper chest: No acute findings.  Clear lung apices. Other: None. IMPRESSION: HEAD CT 1. No acute intracranial abnormalities. 2. Atrophy and chronic microvascular ischemic change. CERVICAL CT 1. No fracture or acute finding. Electronically Signed   By: Lajean Manes M.D.   On: 08/16/2019 13:55   CT Cervical Spine Wo Contrast  Result Date: 08/16/2019 CLINICAL DATA:  Found on ground. Laceration to the left forehead. Headache. EXAM: CT HEAD WITHOUT CONTRAST CT CERVICAL SPINE WITHOUT CONTRAST TECHNIQUE:  Multidetector CT imaging of the head and cervical spine was performed following the standard protocol without intravenous contrast. Multiplanar CT image reconstructions of the cervical spine were also generated. COMPARISON:  Head CT, 08/01/2014. FINDINGS: CT HEAD FINDINGS Brain: No evidence of acute infarction, hemorrhage, hydrocephalus, extra-axial collection or mass lesion/mass effect. There is ventricular and sulcal enlargement reflecting mild diffuse atrophy. Widened extra-axial spaces are noted also reflecting volume loss. Patchy periventricular white matter hypoattenuation is present consistent with moderate chronic microvascular ischemic change, mildly advanced from prior head CT. Atrophy is also increased. Vascular: No hyperdense vessel or unexpected calcification. Skull: Normal. Negative for fracture or focal lesion. Sinuses/Orbits: Globes and orbits are unremarkable. Sinuses and mastoid air cells are clear. Other: None. CT CERVICAL SPINE FINDINGS Alignment: Normal. Skull base and vertebrae: No acute fracture. No primary bone lesion or focal pathologic process. Soft tissues and spinal canal: No prevertebral fluid or swelling. No visible canal hematoma. Disc levels: Moderate loss of disc height at C4-C5, C5-C6 and C6-C7 with mild spondylotic disc bulging. No disc herniation or significant stenosis. Upper chest: No acute findings.  Clear lung apices. Other: None. IMPRESSION: HEAD CT 1. No acute intracranial abnormalities. 2. Atrophy and chronic microvascular ischemic change. CERVICAL CT 1. No fracture or acute finding. Electronically Signed   By: Lajean Manes M.D.   On: 08/16/2019 13:55    Procedures .Marland KitchenLaceration Repair  Date/Time: 08/16/2019 3:57 PM Performed by: Eustaquio Maize, PA-C Authorized by: Eustaquio Maize, PA-C   Laceration details:    Location:  Scalp   Scalp location:  Frontal   Length (cm):  1   Depth (mm):  1 Repair type:    Repair type:  Simple Treatment:    Area cleansed  with:  Betadine   Irrigation solution:  Sterile saline Skin repair:    Repair method:  Tissue adhesive Approximation:    Approximation:  Close Post-procedure details:    Dressing:  Non-adherent dressing   Patient tolerance of procedure:  Tolerated well, no immediate complications   (including critical care time)  Medications Ordered in ED Medications  acetaminophen (TYLENOL) tablet 650 mg (650 mg Oral Given 08/16/19 1455)  sodium chloride 0.9 % bolus 500 mL (500 mLs Intravenous New Bag/Given 08/16/19 1500)    ED Course  I have reviewed the triage vital signs and the nursing notes.  Pertinent labs & imaging results that were available during my care of the patient were reviewed by me and considered in my medical decision making (see chart for details).  85 year old male presents the ED today for mechanical fall although unwitnessed.  He is currently person under investigation as he has been around his daughter who is Covid positive.  Unfortunately he has severe vascular dementia able to give much information, level 5 caveat.  Was  able to speak with daughter on the phone who reports that he is supposed to use his walker when he ambulates and was getting up to go to the bathroom when he fell.  She immediately rushed to his side from the other room states no loss of consciousness.  Patient is not currently anticoagulated and has a small superficial laceration to his forehead as well as skin tear to left forearm.  Will apply Dermabond.  Currently complaining of head pain.  It does appear per daughter that he has had some exertional shortness of breath as well as generalized weakness for the past week.  He had a negative Covid test 11/30 but was told it may be too early to detect.  Rapid Covid test in the ED today negative.  Patient initially tachycardic in the 110s on arrival although in the 90s afterwards.  He is afebrile and does not appear to be overtly working to breathe.  Sats 97% on room air.   Will obtain CT head, CT C-spine as well as left rib x-ray and left forearm x-ray.  Will obtain baseline screening labs given complaint of shortness of breath and generalized weakness.   Head and CT C-spine negative.  X-rays negative as well.  Patient does have bilateral old rib fractures but no acute findings.  Otherwise chest x-ray clear.  EKG today without ischemic changes.  CBC with mild leukocytosis 12,000.  Currently awaiting urinalysis.  NP with creatinine 3.42 and BUN 85.  Patient's baseline creatinine 1.6.  Currently receiving 500 cc fluid bolus although do not want to overload as patient has EF 40 to 45%.  Will need to come in at this time for AKI.   Initial troponin 69 and repeat 73.  BNP 118.  Will call hospitalist at this time for admission.  Discussed case with Dr. Andria Frames agrees to accept patient for admission at this time.  Have called daughter [name] and updated her on plan.    MDM Rules/Calculators/A&P     CHA2DS2/VAS Stroke Risk Points  Current as of 12 minutes ago     8 >= 2 Points: High Risk  1 - 1.99 Points: Medium Risk  0 Points: Low Risk    This is the only CHA2DS2/VAS Stroke Risk Points available for the past  year.: Last Change: N/A     Details    This score determines the patient's risk of having a stroke if the  patient has atrial fibrillation.       Points Metrics  1 Has Congestive Heart Failure:  Yes    Current as of 12 minutes ago  1 Has Vascular Disease:  Yes    Current as of 12 minutes ago  1 Has Hypertension:  Yes    Current as of 12 minutes ago  2 Age:  24    Current as of 12 minutes ago  1 Has Diabetes:  Yes    Current as of 12 minutes ago  2 Had Stroke:  No  Had TIA:  Yes  Had thromboembolism:  Yes     Current as of 12 minutes ago  0 Male:  No    Current as of 12 minutes ago                         Final Clinical Impression(s) / ED Diagnoses Final diagnoses:  AKI (acute kidney injury) ([place])  Fall, initial encounter  Laceration of  forehead, initial encounter    Rx /  DC Orders ED Discharge Orders    None       Eustaquio Maize, PA-C 08/16/19 1604    Sherwood Gambler, MD 08/19/19 [phone]

## 2019-08-17 ENCOUNTER — Telehealth: Payer: Self-pay | Admitting: Family Medicine

## 2019-08-17 DIAGNOSIS — W19XXXA Unspecified fall, initial encounter: Secondary | ICD-10-CM

## 2019-08-17 DIAGNOSIS — F0390 Unspecified dementia without behavioral disturbance: Secondary | ICD-10-CM

## 2019-08-17 DIAGNOSIS — Z609 Problem related to social environment, unspecified: Secondary | ICD-10-CM

## 2019-08-17 LAB — CBC
HCT: 40.3 % (ref 39.0–52.0)
Hemoglobin: 13.6 g/dL (ref 13.0–17.0)
MCH: 29.9 pg (ref 26.0–34.0)
MCHC: 33.7 g/dL (ref 30.0–36.0)
MCV: 88.6 fL (ref 80.0–100.0)
Platelets: 169 10*3/uL (ref 150–400)
RBC: 4.55 MIL/uL (ref 4.22–5.81)
RDW: 14.9 % (ref 11.5–15.5)
WBC: 9.3 10*3/uL (ref 4.0–10.5)
nRBC: 0 % (ref 0.0–0.2)

## 2019-08-17 LAB — BASIC METABOLIC PANEL
Anion gap: 14 (ref 5–15)
BUN: 68 mg/dL — ABNORMAL HIGH (ref 8–23)
CO2: 20 mmol/L — ABNORMAL LOW (ref 22–32)
Calcium: 8.5 mg/dL — ABNORMAL LOW (ref 8.9–10.3)
Chloride: 105 mmol/L (ref 98–111)
Creatinine, Ser: 2.44 mg/dL — ABNORMAL HIGH (ref 0.61–1.24)
GFR calc Af Amer: 26 mL/min — ABNORMAL LOW (ref >60)
GFR calc non Af Amer: 23 mL/min — ABNORMAL LOW (ref >60)
Glucose, Bld: 111 mg/dL — ABNORMAL HIGH (ref 70–99)
Potassium: 3.4 mmol/L — ABNORMAL LOW (ref 3.5–5.1)
Sodium: 139 mmol/L (ref 135–145)

## 2019-08-17 LAB — CK: Total CK: 209 U/L (ref 49–397)

## 2019-08-17 LAB — URINE CULTURE

## 2019-08-17 LAB — GLUCOSE, CAPILLARY: Glucose-Capillary: 107 mg/dL — ABNORMAL HIGH (ref 70–99)

## 2019-08-17 MED ORDER — POTASSIUM CHLORIDE CRYS ER 20 MEQ PO TBCR
40.0000 meq | EXTENDED_RELEASE_TABLET | Freq: Once | ORAL | Status: AC
  Administered 2019-08-17: 40 meq via ORAL
  Filled 2019-08-17: qty 2

## 2019-08-17 NOTE — Evaluation (Signed)
Occupational Therapy Evaluation Patient Details Name: Rahn Milone MRN: [id number] DOB: Oct 28, 1927 Today's Date: 08/17/2019    History of Present Illness Ojani Farnam is a 85 y.o. male presenting with AKI and altered mental status after a fall. PMH is significant for vascular dementia, A. fib, CHF, hypertension, T2DM.   Clinical Impression   This 85 yo male admitted with above presents to acute OT with generalized weakness, decreased balance, and decreased mobility thus putting him at increased risk of falls with basic ADLs. Prior to admission he as living alone with daughter checking in on him (she has only been able to check in on him by phone as of recent due to her being COVID +). Feel he could go home with [facility] as long as he will initially have 24 hour S/prn A anytime he is up on his feet (if this is not possible then recommend SNF for follow up therapy).     Follow Up Recommendations  Home health OT;Supervision/Assistance - 24 hour(if patient can have 24 hour S/prn A post D/C otherwise SNF)    Equipment Recommendations  None recommended by OT       Precautions / Restrictions Precautions Precautions: Fall Restrictions Weight Bearing Restrictions: No      Mobility Bed Mobility Overal bed mobility: Needs Assistance Bed Mobility: Supine to Sit     Supine to sit: Min assist     General bed mobility comments: min A to scoot to EOB  Transfers Overall transfer level: Needs assistance Equipment used: 1 person hand held assist Transfers: Sit to/from [facility] Sit to Stand: Min assist Stand pivot transfers: Min assist       General transfer comment: increased time but did show safety in making sure he had one of his hands on a surface for support before he took a step    Balance Overall balance assessment: Needs assistance Sitting-balance support: No upper extremity supported;Feet supported Sitting balance-Leahy Scale: Fair     Standing balance  support: Single extremity supported Standing balance-Leahy Scale: Poor                             ADL either performed or assessed with clinical judgement   ADL Overall ADL's : Needs assistance/impaired Eating/Feeding: NPO Eating/Feeding Details (indicate cue type and reason): but feel if he was not NPO he would be able to feed himself just fine post tray set in front of him Grooming: Set up;Supervision/safety;Sitting   Upper Body Bathing: Supervision/ safety;Set up;Sitting   Lower Body Bathing: Minimal assistance;Sit to/from stand   Upper Body Dressing : Set up;Supervision/safety;Sitting   Lower Body Dressing: Moderate assistance Lower Body Dressing Details (indicate cue type and reason): min A sit<>stand Toilet Transfer: Minimal assistance;Stand-pivot Toilet Transfer Details (indicate cue type and reason): bed>recliner Toileting- Clothing Manipulation and Hygiene: Minimal assistance;Sit to/from stand               Vision Patient Visual Report: No change from baseline              Pertinent Vitals/Pain Pain Assessment: Faces Faces Pain Scale: Hurts little more Pain Location: "all over" Pain Descriptors / Indicators: Sore;Moaning Pain Intervention(s): Limited activity within patient's tolerance;Monitored during session     Hand Dominance Right   Extremity/Trunk Assessment Upper Extremity Assessment Upper Extremity Assessment: Generalized weakness           Communication Communication Communication: HOH   Cognition Arousal/Alertness: Awake/alert   Overall Cognitive  Status: (history of cognitive impairments, not sure if baseline due to no family present to confirm) Area of Impairment: Orientation;Following commands;Safety/judgement                 Orientation Level: Disoriented to;Time(1999; but did know he was in the hospital due to falling.)     Following Commands: Follows one step commands consistently       General Comments:  Slow to respond at times when asked questions; with transfer from bed to recliner it was a slow process but he was careful in making sure he had a hand on something before he took each step              Home Living Family/patient expects to be discharged to:: Private residence Living Arrangements: Alone Available Help at Discharge: Family;Available PRN/intermittently Type of Home: House Home Access: Stairs to enter [employer] of Steps: 1 Entrance Stairs-Rails: None Home Layout: One level     Bathroom Shower/Tub: Teacher, early years/pre: Standard     Home Equipment: Cane - single point;Walker - 2 wheels;Shower seat   Additional Comments: reports using mostly his cane while at home, but does not alwasy use this in the house. Says he has shower seat but does not use it               OT Problem List: Decreased strength;Impaired balance (sitting and/or standing);Decreased cognition;Pain      OT Treatment/Interventions: Self-care/ADL training;DME and/or AE instruction;Patient/family education;Balance training    OT Goals(Current goals can be found in the care plan section) Acute Rehab OT Goals Patient Stated Goal: to be able to drink some water OT Goal Formulation: With patient Time For Goal Achievement: 08/31/19 Potential to Achieve Goals: Good  OT Frequency: Min 2X/week              AM-PAC OT "6 Clicks" Daily Activity     Outcome Measure Help from another person eating meals?: None Help from another person taking care of personal grooming?: A Little Help from another person toileting, which includes using toliet, bedpan, or urinal?: A Little Help from another person bathing (including washing, rinsing, drying)?: A Little Help from another person to put on and taking off regular upper body clothing?: A Little Help from another person to put on and taking off regular lower body clothing?: A Lot 6 Click Score: 18   End of Session Equipment  Utilized During Treatment: Gait belt Nurse Communication: Mobility status(needs a new condom cath (she replaced it at end of session))  Activity Tolerance: Patient tolerated treatment well Patient left: in chair;with call bell/phone within reach;with chair alarm set  OT Visit Diagnosis: Unsteadiness on feet (R26.81);Other abnormalities of gait and mobility (R26.89);History of falling (Z91.81);Pain;Other symptoms and signs involving cognitive function Pain - Right/Left: (all over)                Time: [id number] OT Time Calculation (min): 32 min Charges:  OT General Charges $OT Visit: 1 Visit OT Evaluation $OT Eval Moderate Complexity: 1 Mod OT Treatments $Self Care/Home Management : 8-22 mins  Golden Circle, OTR/L Acute [employer] Pager [phone] Office [phone]     Almon Register 08/17/2019, 9:19 AM

## 2019-08-17 NOTE — Progress Notes (Signed)
Spoke with patient's daughter regarding patient status.  Informed her that his mental status was improving and that his kidney function was also improving.  The patient's daughter has not had a chance to come and visit the patient to determine if he is back at baseline or not.  The patient's daughter says that she is under extreme stress and knows that she cannot take care of the patient 24/7 at home and says that she is looking for someone to help out but needs someone that she can trust.  She says that she does not want him to go to a SNF because "he was born in the house he lives in and wants to die in the house that he lives in".  Patient's daughter reports she will come by this afternoon and see the patient.  I asked her to let me know and I will come in talk to her if I am still on shift when she arrives.

## 2019-08-17 NOTE — Discharge Summary (Addendum)
[facility] Discharge Summary  Patient name: [name] Elliott Medical record number: [id number] Date of birth: 10-05-1927 Age: 85 y.o. Gender: male Date of Admission: 08/16/2019  Date of Discharge: 08/25/19 Admitting Physician: Zenia Resides, MD  Primary Care Provider: Rutherford Guys, MD Consultants: Palliative Care   Indication for Hospitalization: AMS, AKI and fall   Discharge Diagnoses/Problem List:  Active Problems:   Goals of care, counseling/discussion   AKI (acute kidney injury) ([place])   Altered mental status   Dementia without behavioral disturbance ([place])   Fall   High risk social situation   Palliative care by specialist  Disposition: Home with hospice  Discharge Condition: stable   Discharge Exam:  Physical Exam Constitutional:      General: He is not in acute distress.    Appearance: He is not toxic-appearing.  HENT:     Head: Normocephalic.     Nose: No rhinorrhea.     Mouth/Throat:     Mouth: Mucous membranes are moist.     Pharynx: Oropharynx is clear.  Eyes:     Extraocular Movements: Extraocular movements intact.     Conjunctiva/sclera: Conjunctivae normal.     Pupils: Pupils are equal, round, and reactive to light.  Cardiovascular:     Rate and Rhythm: Normal rate and regular rhythm.     Heart sounds: No friction rub. No gallop.   Pulmonary:     Effort: Pulmonary effort is normal. No respiratory distress.     Breath sounds: Normal breath sounds. No wheezing.  Abdominal:     General: Bowel sounds are normal.     Palpations: Abdomen is soft.  Skin:    General: Skin is warm and dry.  Neurological:     Mental Status: He is disoriented.     Comments: Oriented to person only       Brief Hospital Course:  Fall Patient presented to the emergency department after an unwitnessed fall while his daughter was in the other room. Patient was found on the ground with a head injury and a skin tear on his left arm. In the  emergency department the head injury was repaired with Dermabond in the skin tear on his arm was wrapped in Kerlix. Head CT was negative for acute injury as well as cervical spine. Chest x-ray showed no acute events. Social work was consulted and recommended 24-hour home health and if not SNF.  Discussions were had with the daughter and she reports that she does not think her father would want to go to a SNF and that he wants to go home.  He was "born in his house and wants to die in his house".  Options were discussed and the daughter was requesting home health but the patient does not qualify for 24-hour home health.  I also discussed palliative care with the daughter and she was open to discuss it.  I consulted palliative care and they called the patient's daughter.   Altered mental status Per patient's daughter the patient's mental status has decreased over the past few weeks but has acutely worsened over the past week. Of note, the patient's daughter generally takes care of the patient but she was diagnosed with Covid a week ago and has not seen him in over a week. He had not taken any of his medications for a week when she saw him on the day of admission. On admission the patient was oriented to person but not place or time.  Patient's altered  mental status improved over the following days but would wax and wane.  He was intermittently alert and oriented to person and place but never time.  He would get close and stated the year was 2021.  Discussions were had with his daughter regarding the patient's normal baseline and she says that he is normally oriented to person place and time.  AKI On admission patient's creatinine was 3.42. Patient's baseline is around 1.5. Patient had been at home for a week without the assistance of his daughter and it is suspected that the patient was extremely dehydrated. On initial exam patient's mucous membranes were dry. Patient was given 500 mL bolus of fluid in the ED.  Patient was started on 50 mL an hour normal saline drip once admitted. Morning creatinine on 12/14 was 2.4. Normal saline was continued and creatinine on 12/15 was 1.95>1.57.   End of Life Care  Palliative Care medicine was consulted during this admission and worked with the patient's daughter to develop plan for end of life care. Ultimately, it was decided that the patient would be discharged home with home hospice.   Issues for Follow Up: Home Hospice   Significant Procedures: none  Significant Labs and Imaging:  No results for input(s): WBC, HGB, HCT, PLT in the last 168 hours. Recent Labs  Lab 08/19/19 0629 08/22/19 0526  NA 139 138  K 3.5 3.3*  CL 106 104  CO2 23 25  GLUCOSE 112* 113*  BUN 25* 18  CREATININE 1.57* 1.59*  CALCIUM 8.1* 8.1*   Results/Tests Pending at Time of Discharge: none  Discharge Medications:  Allergies as of 08/25/2019      Reactions   Crestor [rosuvastatin Calcium] Other (See Comments)   Muscle pain/aches   Mestinon [pyridostigmine Bromide Er] Other (See Comments)   GI upset, stomach cramps   Xarelto [rivaroxaban] Other (See Comments)   bleeding      Medication List    STOP taking these medications   aspirin EC 81 MG tablet   furosemide 40 MG tablet Commonly known as: LASIX   gabapentin 300 MG capsule Commonly known as: NEURONTIN   GOODY HEADACHE PO   magnesium hydroxide 400 MG/5ML suspension Commonly known as: MILK OF MAGNESIA   potassium chloride SA 20 MEQ tablet Commonly known as: KLOR-CON     TAKE these medications   acetaminophen 500 MG tablet Commonly known as: TYLENOL Take 1,000 mg by mouth every 6 (six) hours as needed for mild pain.   bismuth subsalicylate 99991111 [id number] suspension Commonly known as: PEPTO BISMOL Take 30 mLs by mouth daily as needed for indigestion or diarrhea or loose stools.   docusate sodium 100 MG capsule Commonly known as: COLACE Take 1 capsule (100 mg total) by mouth every 12 (twelve)  hours. What changed:   when to take this  reasons to take this   famotidine 20 MG tablet Commonly known as: PEPCID Take 1 tablet (20 mg total) by mouth 2 (two) times daily.   isosorbide mononitrate 30 MG 24 hr tablet Commonly known as: IMDUR Take 1 tablet (30 mg total) by mouth daily.   levothyroxine 50 MCG tablet Commonly known as: SYNTHROID Take 1 tablet (50 mcg) every Tuesday and Saturday morning before breakfast. Take 75 mcg on all other days.   levothyroxine 75 MCG tablet Commonly known as: SYNTHROID Take 1 tablet (85mcg) every Monday, Wednesday, Thursday, Friday and Sunday morning before breakfast. Take 50 mcg on all other days   nitroGLYCERIN 0.4 MG SL tablet  Commonly known as: NITROSTAT Place 1 tablet (0.4 mg total) under the tongue every 5 (five) minutes as needed for chest pain.   omeprazole 20 MG capsule Commonly known as: PRILOSEC Take 1 capsule (20 mg total) by mouth 2 (two) times daily before a meal.   risperiDONE 0.5 MG disintegrating tablet Commonly known as: RisperDAL M-TAB Take 1 tablet (0.5 mg total) by mouth daily as needed.       Discharge Instructions: Please refer to Patient Instructions section of EMR for full details.  Patient was counseled important signs and symptoms that should prompt return to medical care, changes in medications, dietary instructions, activity restrictions, and follow up appointments.   Follow-Up Appointments: none  Stark Klein, MD 08/25/2019, 4:44 PM PGY-1, [place]

## 2019-08-17 NOTE — Telephone Encounter (Signed)
levothyroxine (SYNTHROID) tablet 75 mcg  [id number]   auth to switch the brands  Please advise  [id number]

## 2019-08-17 NOTE — Evaluation (Signed)
Physical Therapy Evaluation Patient Details Name: [name] Elliott MRN: [id number] DOB: 1928-06-03 Today's Date: 08/17/2019   History of Present Illness  Jeffri Canela is a 85 y.o. male presenting with AKI and altered mental status after a fall. PMH is significant for vascular dementia, A. fib, CHF, hypertension, T2DM.  Clinical Impression  Pt admitted with above diagnosis. Pt currently with functional limitations due to the deficits listed below (see PT Problem List). Pt will benefit from skilled PT to increase their independence and safety with mobility to allow discharge to the venue listed below.  Pt at times letting go of RW with one hand to reach for bed during ambulation around the bed.  He is moving at [facility] to MIN/GUARD level. Recommend HHPT and 24/7 S due to decreased safety.  If family cannot provide this, then would recommend SNF.     Follow Up Recommendations Home health PT;Supervision/Assistance - 24 hour(if family cannot provide, then recommend SNF)    Equipment Recommendations  None recommended by PT    Recommendations for Other Services       Precautions / Restrictions Precautions Precautions: Fall Restrictions Weight Bearing Restrictions: No      Mobility  Bed Mobility Overal bed mobility: Needs Assistance Bed Mobility: Sit to Supine     Supine to sit: Min assist Sit to supine: Min guard   General bed mobility comments: Increased time to get legs on bed, but did so without physical A.  Cues to get straightened up and to pull up to [facility].  He did with min/guard and heavy use of rail.  Transfers Overall transfer level: Needs assistance Equipment used: Rolling walker (2 wheeled) Transfers: Sit to/from Stand Sit to Stand: Min assist Stand pivot transfers: Min assist       General transfer comment: slow transition to stand with min/guard.  Ambulation/Gait Ambulation/Gait assistance: Min guard;Min assist Gait Distance (Feet): 15 Feet Assistive device:  Rolling walker (2 wheeled) Gait Pattern/deviations: Decreased step length - right;Decreased step length - left;Trunk flexed Gait velocity: decreased   General Gait Details: Pt refused ambulation outside room, but agreeable to ambulate around bed to get back into it from sitting up in recliner. He tends to take one hand off of RW and furniture cruise with 1 hand, while using RW with the other causing decreased safety.  Did not respond well to cues to keep B hands on RW.  Stairs            Wheelchair Mobility    Modified Rankin (Stroke Patients Only)       Balance Overall balance assessment: Needs assistance Sitting-balance support: No upper extremity supported;Feet supported Sitting balance-Leahy Scale: Fair     Standing balance support: Single extremity supported Standing balance-Leahy Scale: Poor Standing balance comment: requires UE support                             Pertinent Vitals/Pain Pain Assessment: Faces Faces Pain Scale: Hurts a little bit Pain Location: grimacing at times with mobility Pain Descriptors / Indicators: Grimacing Pain Intervention(s): Monitored during session    Home Living Family/patient expects to be discharged to:: Private residence Living Arrangements: Alone Available Help at Discharge: Family;Available PRN/intermittently Type of Home: House Home Access: Stairs to enter Entrance Stairs-Rails: None Entrance Stairs-Number of Steps: 1 Home Layout: One level Home Equipment: Cane - single point;Walker - 2 wheels;Shower seat Additional Comments: reports using mostly his cane while at home, but does not alwasy  use this in the house. Says he has shower seat but does not use it    Prior Function                 Hand Dominance   Dominant Hand: Right    Extremity/Trunk Assessment   Upper Extremity Assessment Upper Extremity Assessment: Defer to OT evaluation    Lower Extremity Assessment Lower Extremity Assessment:  Generalized weakness       Communication   Communication: HOH  Cognition Arousal/Alertness: Awake/alert Behavior During Therapy: WFL for tasks assessed/performed Overall Cognitive Status: (history of cognitive impairments, not sure if baseline due to no family present to confirm) Area of Impairment: Orientation;Following commands;Safety/judgement                 Orientation Level: Disoriented to;Time     Following Commands: Follows one step commands consistently;Follows one step commands with increased time Safety/Judgement: Decreased awareness of safety;Decreased awareness of deficits     General Comments: Slow to respond. Knew he was in hospital and that he had fallen at home.  But took 2 attempts to correctly answer what his DOB was.      General Comments General comments (skin integrity, edema, etc.): laceration on forehead which pt knew was from a fall at home.    Exercises     Assessment/Plan    PT Assessment Patient needs continued PT services  PT Problem List Decreased strength;Decreased activity tolerance;Decreased balance;Decreased mobility;Decreased cognition;Decreased knowledge of use of DME;Decreased safety awareness       PT Treatment Interventions DME instruction;Gait training;Functional mobility training;Therapeutic activities;Therapeutic exercise;Balance training;Patient/family education    PT Goals (Current goals can be found in the Care Plan section)  Acute Rehab PT Goals Patient Stated Goal: to get back to bed PT Goal Formulation: With patient Time For Goal Achievement: 08/31/19 Potential to Achieve Goals: Good    Frequency Min 3X/week   Barriers to discharge        Co-evaluation               AM-PAC PT "6 Clicks" Mobility  Outcome Measure Help needed turning from your back to your side while in a flat bed without using bedrails?: A Little Help needed moving from lying on your back to sitting on the side of a flat bed without  using bedrails?: A Little Help needed moving to and from a bed to a chair (including a wheelchair)?: A Little Help needed standing up from a chair using your arms (e.g., wheelchair or bedside chair)?: A Little Help needed to walk in hospital room?: A Little Help needed climbing 3-5 steps with a railing? : A Little 6 Click Score: 18    End of Session Equipment Utilized During Treatment: Gait belt Activity Tolerance: Patient limited by fatigue Patient left: in bed;with call bell/phone within reach;with bed alarm set;Other (comment)(MD entering upon PT exit) Nurse Communication: Mobility status(nurse tech) PT Visit Diagnosis: Unsteadiness on feet (R26.81);Other abnormalities of gait and mobility (R26.89);History of falling (Z91.81)    Time: [id number] PT Time Calculation (min) (ACUTE ONLY): 26 min   Charges:   PT Evaluation $PT Eval Moderate Complexity: 1 Mod PT Treatments $Gait Training: 8-22 mins        Ezekiah Massie L. Tamala Julian, [place] Pager [phone] 08/17/2019   Galen Manila 08/17/2019, 10:01 AM

## 2019-08-17 NOTE — Progress Notes (Signed)
Family Medicine Teaching Service Daily Progress Note Intern Pager: [phone]  Patient name: [name] Elliott Medical record number: [id number] Date of birth: Feb 17, 1928 Age: 85 y.o. Gender: male  Primary Care Provider: Rutherford Guys, MD Consultants: None Code Status: Partial code, no chest compressions, intubation and respiratory support is acceptable.  Clarified with daughter x3  Pt Overview and Major Events to Date:  08/16/2019-patient admitted for AKI and altered mental status   Assessment and Plan: [name] Elliott is a 85 y.o. male presenting with AKI and altered mental status after a fall. PMH is significant for vascular dementia, A. fib, CHF, hypertension, T2DM.  Altered mental status with history of vascular dementia Most recent MoCA evaluation score-12 who ordered Moca due to his education level.  On admission patient was oriented to person and place but not time.  Patient's daughter report that he has become more confused over the past few weeks but notes that over the past week he has been especially confused which she attributes to the fact that she has not been able to visit due to her positive Covid status.  Patient is s/p fall at home, CT head showed no acute intracranial abnormalities but did note atrophy and chronic microvascular ischemic changes.  UA was negative.  EtOH-negative, UDS-negative, lactic acid 2.1> 1.7, high-sensitivity troponins 69> 71> 68.  TSH-4.453.  LFTs within normal limits, total bili mildly elevated at 1.5, indirect bili mildly elevated at 1.1 direct bili mildly elevated at 0.4.  Chest x-ray showed no acute infectious signs.  On evaluation this morning patient is alert and oriented to person and place but not time. -N.p.o. pending swallow eval -Consider repeat Moca -Holding home gabapentin -Frequent neurochecks  -Up with assistance -PT/OT eval and treat -A.m. CBCs, BMP  Recent fall Patient reportedly had a unwitnessed fall out of chair that was heard by  daughter.  Daughter denies any loss of consciousness and he was immediately responsive.  Patient did have some waxing and waning responsiveness a few minutes after she had called 911.  In the ED a head wound was addressed and repaired with Dermabond.  Rib x-ray showed no acute rib fractures, CT head was negative, CT cervical was negative. -PT OT eval and treat -Fall precautions  AKI with CKD 3 Patient has hx of CKD stage III with baseline creatinine of 1.6.  On presentation to the emergency room creatinine was 3.42.  Reactine is 2.44 this morning.  GFR on admission was also lowered to 15 with a baseline in the 30s.. There was concern over the patient's intake over the last week and we believe there is a component of dehydration to his AKI.. CK has been ordered -Follow-up on CK -Strict I's and O's -Hold home Lasix -NS at 50 cc/h -Daily weights  -Monitor BMP -Avoid nephrotoxic agents  SOB with known Covid exposure Patient's daughter's Covid positive.  She reports that the patient was exposed to her prior to her known diagnosis.  He was tested and was negative but it was within the window where he may not be positive yet.  Per daughter the patient has been complaining of shortness of breath over the last week.  Patient was retested on admission to the ED and was negative.  -Discontinue droplet and contact precautions -Continuous pulse ox  HFrEF Most recent echocardiogram was from 06/2016.  It showed LVEF of 40-45%.  Patient's home medications are Imdur 30 mg daily and Lasix 40 mg daily.  Patient has not taken his medications in over a  week.  BNP on arrival is 118.8.   Patient does not appear extremely fluid overloaded on exam. No need for repeat echocardiogram at this time -Holding Lasix at this time due to AKI -Continue home Imdur 30 mg -Strict I's and O's -Continuous cardiac monitoring   Elevated troponins High-sensitivity troponins have been 69>73>68.  Elevation in high-sensitivity  troponins is most likely due to demand ischemia.  -No need to continue trending troponins  A. fib Follows with [facility]. EKG on admission showed sinus tachycardia with left fascicular block and a regular rhythm.  A. fib is listed on his problem list but the daughter denies it and he is not on any rate control or anticoagulation.  Per chart review patient was on anticoagulation at one point but had frequent GI bleeds which required ED visits so anticoagulation was discontinued. Patient was also prescribed beta-blocker and ACE/ARB at one point but the beta-blocker was discontinued because his rate could not tolerate it and his ACE/ARB was discontinued because his kidney function was worsening. -monitor on telemetry  -AM EKG showed sinus rhythm with marked sinus arrhythmia with PVCs and PSVCs -Consider cardiology consult if in changes in the EKG  HTN Patient's blood pressure since admission have ranged from 110/58-149/88.  Most recent blood pressure 136/53. Home medications include Imdur 30 mg daily and Lasix 40 mg daily. -Monitor blood pressures -Vitals per routine -Continue Imdur 30 mg daily -Discontinue Lasix 40 mg daily given AKI  T2DM Glucose was 200 on arrival.  Most recent hemoglobin was 07/23/2019 and was 6.9.  Diabetes is managed by diet.  Blood sugar this morning 107. -Monitor blood glucoses off morning BMP  Hypothyroidism Patient takes Synthroid at home.  He is prescribed 50 mg every Tuesday and Saturday and 75 mg all remaining days of the week.  Patient has been noncompliant with his medications over the past week. -TSH -Continue home Synthroid dosage schedule  FEN/GI: NPO until speech eval  PPx: Heparin GGT   Disposition: Possibly SNF pending PT/OT eval  Subjective:  Patient is doing well this morning although he reports he is sore.  I tell him that it is probably because he fell and he says "yes, I fell on Sunday".  He does not know what the current day is.  He  denies shortness of breath this morning or chest pain.  Denies headaches.  Objective: Temp:  [97.4 F (36.3 C)-97.9 F (36.6 C)] 97.9 F (36.6 C) (12/14 0424) Pulse Rate:  [29-98] 69 (12/14 0424) Resp:  [13-26] 16 (12/14 0424) BP: (110-149)/(53-88) 136/53 (12/14 0424) SpO2:  [93 %-100 %] 98 % (12/14 0424) Weight:  [64.9 kg] 64.9 kg (12/14 0424) General: NAD, resting comfortably in bed when I enter the room HEENT:  Normocephalic, laceration above the left eye that has been repaired with Dermabond.  Hematoma on left upper extremity with bandage over skin tear. Cardiac: RRR, no m/r/g Respiratory: Normal work of breathing, patient has expiratory wheeze in the left lung field. Abdomen: soft, nontender, nondistended, bowel sounds normal  Skin: warm and dry Neuro: alert and oriented to person and place but not time.  He thinks it is 77.  He is unsure of who the president is.   Laboratory: Recent Labs  Lab 08/16/19 1217  WBC 12.6*  HGB 15.9  HCT 48.3  PLT 194   Recent Labs  Lab 08/16/19 1217 08/16/19 1917  NA 136  --   K 4.3  --   CL 98  --  CO2 19*  --   BUN 85*  --   CREATININE 3.42*  --   CALCIUM 9.1  --   PROT  --  6.6  BILITOT  --  1.5*  ALKPHOS  --  49  ALT  --  15  AST  --  23  GLUCOSE 200*  --    EKG-Sinus rhythm with marked sinus arrhythmia with premature supraventricular complexes and premature ventricular complexes or fusion complexes  Imaging/Diagnostic Tests: DG Chest 1 View  Result Date: 08/17/2019 CLINICAL DATA:  Shortness of breath. EXAM: CHEST  1 VIEW COMPARISON:  AP view earlier this day at 1333 included with rib series FINDINGS: Lateral view only obtained. No evidence of pleural effusion. Severe lower compression fracture which was seen on abdominal CT 08/03/2019. IMPRESSION: Lateral view only of the chest demonstrates no pleural effusion. Please reference AP chest obtained with rib series for more detailed evaluation. Chronic compression fracture  in the lower thoracic spine. Electronically Signed   By: Keith Rake M.D.   On: 08/17/2019 00:25   DG Ribs Unilateral W/Chest Left  Result Date: 08/16/2019 CLINICAL DATA:  Left rib pain. Unwitnessed fall. EXAM: LEFT RIBS AND CHEST - 3+ VIEW COMPARISON:  August 03, 2019. FINDINGS: Old bilateral rib fractures are noted. No acute left rib fracture is noted. There is no evidence of pneumothorax or pleural effusion. Both lungs are clear. Heart size and mediastinal contours are within normal limits. IMPRESSION: No acute cardiopulmonary abnormality seen. Old bilateral rib fractures are noted. No acute left rib fracture is noted. Electronically Signed   By: Marijo Conception M.D.   On: 08/16/2019 14:04   DG Forearm Left  Result Date: 08/16/2019 CLINICAL DATA:  Pt has dementia but lives by himself, here via [facility]. Pt's daughter has covid and he was tested thurs and was neg. Pt was found on ground, unknown down time. Pt has lac to L forehead, skin tear to L forearm and wrapped in gauze EXAM: LEFT FOREARM - 2 VIEW COMPARISON:  Left elbow radiographs 05/13/2010 FINDINGS: There is no evidence of fracture or other focal bone lesions. No evidence of dislocation. Osteopenia. There are a few nonspecific calcified densities in the posterior elbow which were present in 2011. IMPRESSION: No acute osseous abnormality in the left forearm. Electronically Signed   By: Audie Pinto M.D.   On: 08/16/2019 14:05   CT Head Wo Contrast  Result Date: 08/16/2019 CLINICAL DATA:  Found on ground. Laceration to the left forehead. Headache. EXAM: CT HEAD WITHOUT CONTRAST CT CERVICAL SPINE WITHOUT CONTRAST TECHNIQUE: Multidetector CT imaging of the head and cervical spine was performed following the standard protocol without intravenous contrast. Multiplanar CT image reconstructions of the cervical spine were also generated. COMPARISON:  Head CT, 08/01/2014. FINDINGS: CT HEAD FINDINGS Brain: No evidence of acute infarction,  hemorrhage, hydrocephalus, extra-axial collection or mass lesion/mass effect. There is ventricular and sulcal enlargement reflecting mild diffuse atrophy. Widened extra-axial spaces are noted also reflecting volume loss. Patchy periventricular white matter hypoattenuation is present consistent with moderate chronic microvascular ischemic change, mildly advanced from prior head CT. Atrophy is also increased. Vascular: No hyperdense vessel or unexpected calcification. Skull: Normal. Negative for fracture or focal lesion. Sinuses/Orbits: Globes and orbits are unremarkable. Sinuses and mastoid air cells are clear. Other: None. CT CERVICAL SPINE FINDINGS Alignment: Normal. Skull base and vertebrae: No acute fracture. No primary bone lesion or focal pathologic process. Soft tissues and spinal canal: No prevertebral fluid or swelling. No visible canal  hematoma. Disc levels: Moderate loss of disc height at C4-C5, C5-C6 and C6-C7 with mild spondylotic disc bulging. No disc herniation or significant stenosis. Upper chest: No acute findings.  Clear lung apices. Other: None. IMPRESSION: HEAD CT 1. No acute intracranial abnormalities. 2. Atrophy and chronic microvascular ischemic change. CERVICAL CT 1. No fracture or acute finding. Electronically Signed   By: Lajean Manes M.D.   On: 08/16/2019 13:55   CT Cervical Spine Wo Contrast  Result Date: 08/16/2019 CLINICAL DATA:  Found on ground. Laceration to the left forehead. Headache. EXAM: CT HEAD WITHOUT CONTRAST CT CERVICAL SPINE WITHOUT CONTRAST TECHNIQUE: Multidetector CT imaging of the head and cervical spine was performed following the standard protocol without intravenous contrast. Multiplanar CT image reconstructions of the cervical spine were also generated. COMPARISON:  Head CT, 08/01/2014. FINDINGS: CT HEAD FINDINGS Brain: No evidence of acute infarction, hemorrhage, hydrocephalus, extra-axial collection or mass lesion/mass effect. There is ventricular and sulcal  enlargement reflecting mild diffuse atrophy. Widened extra-axial spaces are noted also reflecting volume loss. Patchy periventricular white matter hypoattenuation is present consistent with moderate chronic microvascular ischemic change, mildly advanced from prior head CT. Atrophy is also increased. Vascular: No hyperdense vessel or unexpected calcification. Skull: Normal. Negative for fracture or focal lesion. Sinuses/Orbits: Globes and orbits are unremarkable. Sinuses and mastoid air cells are clear. Other: None. CT CERVICAL SPINE FINDINGS Alignment: Normal. Skull base and vertebrae: No acute fracture. No primary bone lesion or focal pathologic process. Soft tissues and spinal canal: No prevertebral fluid or swelling. No visible canal hematoma. Disc levels: Moderate loss of disc height at C4-C5, C5-C6 and C6-C7 with mild spondylotic disc bulging. No disc herniation or significant stenosis. Upper chest: No acute findings.  Clear lung apices. Other: None. IMPRESSION: HEAD CT 1. No acute intracranial abnormalities. 2. Atrophy and chronic microvascular ischemic change. CERVICAL CT 1. No fracture or acute finding. Electronically Signed   By: Lajean Manes M.D.   On: 08/16/2019 13:55     Gifford Shave, MD 08/17/2019, 5:49 AM PGY-1, [place] Intern pager: [phone], text pages welcome

## 2019-08-17 NOTE — Evaluation (Signed)
Clinical/Bedside Swallow Evaluation Patient Details  Name: [name] Elliott MRN: [id number] Date of Birth: October 11, 1927  Today's Date: 08/17/2019 Time: SLP Start Time (ACUTE ONLY): 0855 SLP Stop Time (ACUTE ONLY): 0920 SLP Time Calculation (min) (ACUTE ONLY): 25 min  Past Medical History:  Past Medical History:  Diagnosis Date  . Arthritis   . Arthropathy, unspecified, site unspecified   . Atrial fibrillation ([place])   . Blood dyscrasia    thromboctopenia pt family states he was never told of this  . CAD (coronary artery disease)    last cath in 2012. Managed medically-some blockages  . Chronic lower back pain   . Chronic systolic CHF (congestive heart failure) ([place]) 06/04/2016  . Constipation, chronic   . Diabetes mellitus without complication ([place])    pt states he doesn't have  . DVT (deep venous thrombosis) ([place])   . Dysrhythmia   . Esophageal reflux   . 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 MRI Nov '11 - tendonosis and partial tear. Left shoulder surgery Jan '12 for partial shoulder replacement. [name])   . Headache(784.0)   . History of stomach ulcers 11/2012  . Hypothyroidism    pt states he doeen't have   . Orthostasis   . Other B-complex deficiencies   . Other malaise and fatigue   . Pain in joint, shoulder region    Has chronic shoulder pain  . Pain in limb   . Persistent disorder of initiating or maintaining sleep   . Pneumonia 1980's?; 2011  . Primary localized osteoarthritis of left knee 07/12/2015  . Pure hypercholesterolemia   . PVC's (premature ventricular contractions)   . S/P cardiac cath 08/08/11   mild to moderate CAD primarily in the LAD. None are obstructive and appear stable from prior cath in 2007; managed medically  . Sebaceous cyst   . Stroke ([place])   . Thrombocytopenia, unspecified ([place])   . Unspecified essential hypertension   . Vascular dementia without behavioral disturbance ([facility])    with periods of amnesia   Past  Surgical History:  Past Surgical History:  Procedure Laterality Date  . BACK SURGERY    . CARDIAC CATHETERIZATION  08/2011  . CATARACT EXTRACTION Left 2009  . COLONOSCOPY    . ESOPHAGOGASTRODUODENOSCOPY N/A 11/19/2012   Procedure: ESOPHAGOGASTRODUODENOSCOPY (EGD);  Surgeon: Jerene Bears, MD;  Location: [facility];  Service: Gastroenterology;  Laterality: N/A;  . FINGER AMPUTATION Right    pinky finger  . HAND SURGERY Right    crush injury, right fifth digit contracture, limited  motion  . HARDWARE REMOVAL  02/05/2012   Procedure: HARDWARE REMOVAL;  Surgeon: Johnny Bridge, MD;  Location: [facility];  Service: Orthopedics;  Laterality: Left;  . KNEE SURGERY Left 1991   "did it twice in 1 wk" (02/17/2013)  . LUMBAR SPINE SURGERY  2007   Dr Philip Aspen ([place])  . REVERSE SHOULDER ARTHROPLASTY  02/05/2012   Procedure: REVERSE SHOULDER ARTHROPLASTY;  Surgeon: Johnny Bridge, MD;  Location: [facility];  Service: Orthopedics;  Laterality: Left;  . SHOULDER HEMI-ARTHROPLASTY    . SHOULDER OPEN ROTATOR CUFF REPAIR Left 8.30.2011  . TOTAL KNEE ARTHROPLASTY Left 07/12/2015   Procedure: TOTAL KNEE ARTHROPLASTY;  Surgeon: Marchia Bond, MD;  Location: [facility];  Service: Orthopedics;  Laterality: Left;  . US ECHOCARDIOGRAPHY  02-28-2010   Est EF 50-55%   HPI:  85yo male admitted 08/16/2019 with AKI, AMS, fall. PMH: vascular dementia, AFib, CHF, HTN, DM2. 12/30 on recent MoCA per chart review.  Assessment / Plan / Recommendation Clinical Impression  Pt was sitting upright in recliner upon arrival of SLP. Oral cavity was noted to be dry. Pt is edentulous, and indicated his dentures are at home. Pt accepted trials of ice chips, water, and applesauce. Oral phase appeared functional, and there were no overt s/s aspiration. Pt was unable to chew up the cracker without his teeth, so this consistency was not given. Recommend beginning puree diet and thin liquids, crushed meds. safe swallow precautions were posted at  [facility]. SLP will follow briefly for diet tolerance and appropriateness to advance. If dentures are brought from home, anticipate being able to advance solids without difficulty.    SLP Visit Diagnosis: Dysphagia, unspecified (R13.10)    Aspiration Risk  Mild aspiration risk    Diet Recommendation Dysphagia 1 (Puree);Thin liquid   Liquid Administration via: Cup;Straw Medication Administration: Crushed with puree Supervision: Patient able to self feed;Staff to assist with self feeding;Full supervision/cueing for compensatory strategies Compensations: Slow rate;Small sips/bites Postural Changes: Seated upright at 90 degrees    Other  Recommendations Oral Care Recommendations: Oral care BID   Follow up Recommendations 24 hour supervision/assistance      Frequency and Duration min 1 x/week  1 week;2 weeks       Prognosis Prognosis for Safe Diet Advancement: Fair Barriers to Reach Goals: Cognitive deficits      Swallow Study   General Date of Onset: 08/16/19 HPI: 85yo male admitted 08/16/2019 with AKI, AMS, fall. PMH: vascular dementia, AFib, CHF, HTN, DM2. 12/30 on recent MoCA Type of Study: Bedside Swallow Evaluation Previous Swallow Assessment: none Diet Prior to this Study: NPO Temperature Spikes Noted: No Respiratory Status: Room air History of Recent Intubation: No Behavior/Cognition: Cooperative;Requires cueing Oral Cavity Assessment: Dry Oral Care Completed by SLP: No Oral Cavity - Dentition: Dentures, not available;Edentulous Vision: Functional for self-feeding Self-Feeding Abilities: Able to feed self;Needs assist;Needs set up Patient Positioning: Upright in chair Baseline Vocal Quality: Low vocal intensity Volitional Cough: Cognitively unable to elicit Volitional Swallow: Unable to elicit    Oral/Motor/Sensory Function Overall Oral Motor/Sensory Function: Generalized oral weakness   Ice Chips Ice chips: Within functional limits Presentation: Spoon   Thin Liquid  Thin Liquid: Within functional limits Presentation: Straw;Self Fed    Nectar Thick Nectar Thick Liquid: Not tested   Honey Thick Honey Thick Liquid: Not tested   Puree Puree: Within functional limits Presentation: Self Fed;Spoon   Solid     Solid: Not tested(Pt reports he is unable to chew up a graham cracker without his dentures, which are at home)     Enriqueta Shutter, [facility], [place] Pathologist Office: [phone] Pager: [phone]  Shonna Chock 08/17/2019,9:28 AM

## 2019-08-18 DIAGNOSIS — F0280 Dementia in other diseases classified elsewhere without behavioral disturbance: Secondary | ICD-10-CM

## 2019-08-18 DIAGNOSIS — G309 Alzheimer's disease, unspecified: Secondary | ICD-10-CM

## 2019-08-18 LAB — CBC
HCT: 38.4 % — ABNORMAL LOW (ref 39.0–52.0)
Hemoglobin: 12.5 g/dL — ABNORMAL LOW (ref 13.0–17.0)
MCH: 29.5 pg (ref 26.0–34.0)
MCHC: 32.6 g/dL (ref 30.0–36.0)
MCV: 90.6 fL (ref 80.0–100.0)
Platelets: 168 10*3/uL (ref 150–400)
RBC: 4.24 MIL/uL (ref 4.22–5.81)
RDW: 15.3 % (ref 11.5–15.5)
WBC: 7.7 10*3/uL (ref 4.0–10.5)
nRBC: 0 % (ref 0.0–0.2)

## 2019-08-18 LAB — BASIC METABOLIC PANEL
Anion gap: 9 (ref 5–15)
BUN: 45 mg/dL — ABNORMAL HIGH (ref 8–23)
CO2: 23 mmol/L (ref 22–32)
Calcium: 8.4 mg/dL — ABNORMAL LOW (ref 8.9–10.3)
Chloride: 109 mmol/L (ref 98–111)
Creatinine, Ser: 1.95 mg/dL — ABNORMAL HIGH (ref 0.61–1.24)
GFR calc Af Amer: 34 mL/min — ABNORMAL LOW (ref >60)
GFR calc non Af Amer: 30 mL/min — ABNORMAL LOW (ref >60)
Glucose, Bld: 105 mg/dL — ABNORMAL HIGH (ref 70–99)
Potassium: 3.9 mmol/L (ref 3.5–5.1)
Sodium: 141 mmol/L (ref 135–145)

## 2019-08-18 LAB — TROPONIN I (HIGH SENSITIVITY): Troponin I (High Sensitivity): 38 ng/L — ABNORMAL HIGH (ref <18)

## 2019-08-18 NOTE — Plan of Care (Signed)
  Problem: Education: Goal: Knowledge of General Education information will improve Description: Including pain rating scale, medication(s)/side effects and non-pharmacologic comfort measures Outcome: Progressing   Problem: Activity: Goal: Activity intolerance will improve Outcome: Progressing

## 2019-08-18 NOTE — Progress Notes (Signed)
Patient is becoming more confused and taking his cardiac monitor off. Education provided about importance of keeping it on, but patient states, "I don't have to keep anything on!" MD notified and made aware. MD stated that it was ok for patient to be off of cardiac monitoring, but to try again later if patient becomes less confused. Orders followed. Will continue to monitor.

## 2019-08-18 NOTE — Progress Notes (Signed)
Was paged that [name] Elliott daughter was at his bedside and would like to speak with a doctor. I spoke to his daughter and introduced myself as the on-call doctor for tonight and that I work along side with [name] Elliott. She says she was upset to hear that we said [name] Elliott may only have 6 months to live and that we said he was "palliative". I explained to [name] Elliott that palliative can mean different things in different situations and in her father's situation we mean that he is in the latter stage of his life and he has slowly declined in his health and nobody can predict when he will pass away. I explained we would like to maximize [name] Elliott quality of life in the time he has left and give him want he would want. She explained that [place] would [place] and if he to pass away he would like to be at home when this happens. I explained that he will now require 24 hr assistance and we can organize for him to have hospice at home. I explained to his daughter that [name] Berna Spare will be more than happy to speak to her about things further tomorrow after we have got the ball rolling with social work. She was greatly appreciative of our chat and felt better after speaking things through.  [name] Elliott PGY-1, [place]

## 2019-08-18 NOTE — Progress Notes (Signed)
Spoke with the patient's daughter regarding the patient status.  Patient's daughter is still working on trying to figure out resources for patient to have home health care.  She reports she talk to their insurance and he is not eligible for at home "custodial care".  He does qualify for 21 days of rehab after 1 January.  I brought up the idea of palliative care with the patient and she is open to discuss it.  Daughter understands that it is not safe for the patient to return home and be at home alone for long periods of time.

## 2019-08-18 NOTE — Progress Notes (Signed)
Family Medicine Teaching Service Daily Progress Note Intern Pager: [phone]  Patient name: [name] Elliott Medical record number: [id number] Date of birth: Oct 09, 1927 Age: 85 y.o. Gender: male  Primary Care Provider: Rutherford Guys, MD Consultants: None Code Status: Partial code, no chest compressions, intubation and respiratory support is acceptable.  Clarified with daughter x3  Pt Overview and Major Events to Date:  08/16/2019-patient admitted for AKI and altered mental status   Assessment and Plan: [name] Elliott is a 85 y.o. male presenting with AKI and altered mental status after a fall. PMH is significant for vascular dementia, A. fib, CHF, hypertension, T2DM.  Altered mental status with history of vascular dementia Most recent MoCA evaluation score-12 who ordered Moca due to his education level.  On admission patient was oriented to person and place but not time.  Patient's daughter report that he has become more confused over the past few weeks but notes that over the past week he has been especially confused which she attributes to the fact that she has not been able to visit due to her positive Covid status.  Patient is s/p fall at home, CT head showed no acute intracranial abnormalities but did note atrophy and chronic microvascular ischemic changes.  UA was negative.  EtOH-negative, UDS-negative, lactic acid 2.1> 1.7, high-sensitivity troponins 69> 71> 68.  TSH-4.453.  LFTs within normal limits, total bili mildly elevated at 1.5, indirect bili mildly elevated at 1.1 direct bili mildly elevated at 0.4.  Chest x-ray showed no acute infectious signs.  His mental status had improved this morning.  He was unsure of the time or date but knew that he was at [facility] in [place] work consult to discuss possible disposition. -Consider repeat Moca -Holding home gabapentin -Frequent neurochecks  -Up with assistance -PT/OT eval and treat -A.m. CBCs,  BMP  Chest/epigastric discomfort Patient is complaining of sudden onset chest/epigastric pain.  Sternum and epigastric area are tender to palpation.  EKG showed A. fib.  Troponins were ordered.  Per the patient's daughter yesterday she said that the patient has been complaining of burning when he eats in his chest and this may just be a reflux/GERD issue.  Given the EKG findings we will still follow-up on troponins but ACS is unlikely. -Follow-up on troponin -Continue Protonix 40 mg twice daily -Consider GI consult inpatient versus outpatient  Recent fall Patient reportedly had a unwitnessed fall out of chair that was heard by daughter.  Daughter denies any loss of consciousness and he was immediately responsive.  Patient did have some waxing and waning responsiveness a few minutes after she had called 911.  In the ED a head wound was addressed and repaired with Dermabond.  Rib x-ray showed no acute rib fractures, CT head was negative, CT cervical was negative. -PT OT eval and treat -Fall precautions -We will discuss discharge with patient's daughter today and determine best place for the patient to go.  AKI with CKD 3 Patient has hx of CKD stage III with baseline creatinine of 1.6.  On presentation to the emergency room creatinine was 3.42.  Creatinine 1.95 this morning.  GFR on admission was also lowered to 15 with a baseline in the 30s.. There was concern over the patient's intake over the last week and we believe there is a component of dehydration to his AKI.. CK has been ordered -Follow-up on CK -Strict I's and O's -Hold home Lasix -NS at 50 cc/h -Daily weights  -Monitor BMP -Avoid nephrotoxic agents  SOB with known Covid exposure Patient's daughter's Covid positive.  She reports that the patient was exposed to her prior to her known diagnosis.  He was tested and was negative but it was within the window where he may not be positive yet.  Per daughter the patient has been complaining  of shortness of breath over the last week.  Patient was retested on admission to the ED and was negative.  -Discontinue droplet and contact precautions -Continuous pulse ox  HFrEF Most recent echocardiogram was from 06/2016.  It showed LVEF of 40-45%.  Patient's home medications are Imdur 30 mg daily and Lasix 40 mg daily.  Patient has not taken his medications in over a week.  BNP on arrival is 118.8.   Patient does not appear extremely fluid overloaded on exam. No need for repeat echocardiogram at this time -Holding Lasix at this time due to AKI -Continue home Imdur 30 mg -Strict I's and O's -Continuous cardiac monitoring   Elevated troponins High-sensitivity troponins have been 69>73>68.  Elevation in high-sensitivity troponins is most likely due to demand ischemia.  Patient complained of new chest pain today and so I obtained 1 troponin which was 38. -No need to continue trending troponins  A. fib Follows with [facility]. EKG on admission showed sinus tachycardia with left fascicular block and a regular rhythm.  A. fib is listed on his problem list but the daughter denies it and he is not on any rate control or anticoagulation.  Per chart review patient was on anticoagulation at one point but had frequent GI bleeds which required ED visits so anticoagulation was discontinued. Patient was also prescribed beta-blocker and ACE/ARB at one point but the beta-blocker was discontinued because his rate could not tolerate it and his ACE/ARB was discontinued because his kidney function was worsening.  Patient complained of new sharp chest pain/epigastric pain.  EKG was obtained -monitor on telemetry  -AM EKG showed sinus rhythm with marked sinus arrhythmia with PVCs  -Consider cardiology consult if in changes in the EKG  HTN Patient's blood pressure since admission have ranged from 108/65-142/74.  Most recent blood pressure 136/53. Home medications include Imdur 30 mg daily and Lasix 40 mg  daily.  -Monitor blood pressures -Vitals per routine -Continue Imdur 30 mg daily -Discontinue Lasix 40 mg daily given AKI  T2DM Glucose was 200 on arrival.  Most recent hemoglobin was 07/23/2019 and was 6.9.  Diabetes is managed by diet.  Blood sugar this morning 107. -Monitor blood glucoses off morning BMP  Hypothyroidism Patient takes Synthroid at home.  He is prescribed 50 mg every Tuesday and Saturday and 75 mg all remaining days of the week.  Patient has been noncompliant with his medications over the past week. -TSH -Continue home Synthroid dosage schedule  FEN/GI: NPO until speech eval  PPx: Heparin GGT   Disposition: SNF vs home   Subjective:  When I entered the room patient was complaining of chest/epigastric pain.  He said that that just started.  He was eating his breakfast.  I went to asked the nurse if this was new for him this morning.  She said that she had not heard this complaint yet so we got an EKG which showed A. fib.  Patient's chest was tender to palpation as well as his epigastric area.  Patient and his nurse deny that the patient had a bowel movement yesterday.  Objective: Temp:  [97.6 F (36.4 C)-98.3 F (36.8 C)] 98.1 F (36.7 C) (12/15  [id number]) Pulse Rate:  [51-102] 60 (12/15 0437) Resp:  [16-18] 18 (12/15 0437) BP: (108-142)/(48-74) 123/52 (12/15 0437) SpO2:  [98 %-99 %] 98 % (12/15 0437) General: NAD, sitting up in bed eating breakfast.  Complaining of sharp chest pain/epigastric pain HEENT:  Normocephalic, laceration above the left eye that has been repaired with Dermabond.  Hematoma on left upper extremity with bandage over skin tear. Cardiac: RRR, no m/r/g Respiratory: Normal work of breathing, patient has expiratory wheeze in the left lung field. Abdomen: soft, nontender, nondistended, bowel sounds normal  Skin: warm and dry Neuro: Alert and oriented to person and place.  Reports the year is 2021 and is unsure of the month or  day.   Laboratory: Recent Labs  Lab 08/16/19 1217 08/17/19 0620  WBC 12.6* 9.3  HGB 15.9 13.6  HCT 48.3 40.3  PLT 194 169   Recent Labs  Lab 08/16/19 1217 08/16/19 1917 08/17/19 0620  NA 136  --  139  K 4.3  --  3.4*  CL 98  --  105  CO2 19*  --  20*  BUN 85*  --  68*  CREATININE 3.42*  --  2.44*  CALCIUM 9.1  --  8.5*  PROT  --  6.6  --   BILITOT  --  1.5*  --   ALKPHOS  --  49  --   ALT  --  15  --   AST  --  23  --   GLUCOSE 200*  --  111*   EKG-Sinus rhythm with marked sinus arrhythmia with premature supraventricular complexes and premature ventricular complexes or fusion complexes  Imaging/Diagnostic Tests: DG Chest 1 View  Result Date: 08/17/2019 CLINICAL DATA:  Shortness of breath. EXAM: CHEST  1 VIEW COMPARISON:  AP view earlier this day at 1333 included with rib series FINDINGS: Lateral view only obtained. No evidence of pleural effusion. Severe lower compression fracture which was seen on abdominal CT 08/03/2019. IMPRESSION: Lateral view only of the chest demonstrates no pleural effusion. Please reference AP chest obtained with rib series for more detailed evaluation. Chronic compression fracture in the lower thoracic spine. Electronically Signed   By: Keith Rake M.D.   On: 08/17/2019 00:25   DG Ribs Unilateral W/Chest Left  Result Date: 08/16/2019 CLINICAL DATA:  Left rib pain. Unwitnessed fall. EXAM: LEFT RIBS AND CHEST - 3+ VIEW COMPARISON:  August 03, 2019. FINDINGS: Old bilateral rib fractures are noted. No acute left rib fracture is noted. There is no evidence of pneumothorax or pleural effusion. Both lungs are clear. Heart size and mediastinal contours are within normal limits. IMPRESSION: No acute cardiopulmonary abnormality seen. Old bilateral rib fractures are noted. No acute left rib fracture is noted. Electronically Signed   By: Marijo Conception M.D.   On: 08/16/2019 14:04   DG Forearm Left  Result Date: 08/16/2019 CLINICAL DATA:  Pt has  dementia but lives by himself, here via [facility]. Pt's daughter has covid and he was tested thurs and was neg. Pt was found on ground, unknown down time. Pt has lac to L forehead, skin tear to L forearm and wrapped in gauze EXAM: LEFT FOREARM - 2 VIEW COMPARISON:  Left elbow radiographs 05/13/2010 FINDINGS: There is no evidence of fracture or other focal bone lesions. No evidence of dislocation. Osteopenia. There are a few nonspecific calcified densities in the posterior elbow which were present in 2011. IMPRESSION: No acute osseous abnormality in the left forearm. Electronically Signed   By:  Audie Pinto M.D.   On: 08/16/2019 14:05   CT Head Wo Contrast  Result Date: 08/16/2019 CLINICAL DATA:  Found on ground. Laceration to the left forehead. Headache. EXAM: CT HEAD WITHOUT CONTRAST CT CERVICAL SPINE WITHOUT CONTRAST TECHNIQUE: Multidetector CT imaging of the head and cervical spine was performed following the standard protocol without intravenous contrast. Multiplanar CT image reconstructions of the cervical spine were also generated. COMPARISON:  Head CT, 08/01/2014. FINDINGS: CT HEAD FINDINGS Brain: No evidence of acute infarction, hemorrhage, hydrocephalus, extra-axial collection or mass lesion/mass effect. There is ventricular and sulcal enlargement reflecting mild diffuse atrophy. Widened extra-axial spaces are noted also reflecting volume loss. Patchy periventricular white matter hypoattenuation is present consistent with moderate chronic microvascular ischemic change, mildly advanced from prior head CT. Atrophy is also increased. Vascular: No hyperdense vessel or unexpected calcification. Skull: Normal. Negative for fracture or focal lesion. Sinuses/Orbits: Globes and orbits are unremarkable. Sinuses and mastoid air cells are clear. Other: None. CT CERVICAL SPINE FINDINGS Alignment: Normal. Skull base and vertebrae: No acute fracture. No primary bone lesion or focal pathologic process. Soft tissues  and spinal canal: No prevertebral fluid or swelling. No visible canal hematoma. Disc levels: Moderate loss of disc height at C4-C5, C5-C6 and C6-C7 with mild spondylotic disc bulging. No disc herniation or significant stenosis. Upper chest: No acute findings.  Clear lung apices. Other: None. IMPRESSION: HEAD CT 1. No acute intracranial abnormalities. 2. Atrophy and chronic microvascular ischemic change. CERVICAL CT 1. No fracture or acute finding. Electronically Signed   By: Lajean Manes M.D.   On: 08/16/2019 13:55   CT Cervical Spine Wo Contrast  Result Date: 08/16/2019 CLINICAL DATA:  Found on ground. Laceration to the left forehead. Headache. EXAM: CT HEAD WITHOUT CONTRAST CT CERVICAL SPINE WITHOUT CONTRAST TECHNIQUE: Multidetector CT imaging of the head and cervical spine was performed following the standard protocol without intravenous contrast. Multiplanar CT image reconstructions of the cervical spine were also generated. COMPARISON:  Head CT, 08/01/2014. FINDINGS: CT HEAD FINDINGS Brain: No evidence of acute infarction, hemorrhage, hydrocephalus, extra-axial collection or mass lesion/mass effect. There is ventricular and sulcal enlargement reflecting mild diffuse atrophy. Widened extra-axial spaces are noted also reflecting volume loss. Patchy periventricular white matter hypoattenuation is present consistent with moderate chronic microvascular ischemic change, mildly advanced from prior head CT. Atrophy is also increased. Vascular: No hyperdense vessel or unexpected calcification. Skull: Normal. Negative for fracture or focal lesion. Sinuses/Orbits: Globes and orbits are unremarkable. Sinuses and mastoid air cells are clear. Other: None. CT CERVICAL SPINE FINDINGS Alignment: Normal. Skull base and vertebrae: No acute fracture. No primary bone lesion or focal pathologic process. Soft tissues and spinal canal: No prevertebral fluid or swelling. No visible canal hematoma. Disc levels: Moderate loss of  disc height at C4-C5, C5-C6 and C6-C7 with mild spondylotic disc bulging. No disc herniation or significant stenosis. Upper chest: No acute findings.  Clear lung apices. Other: None. IMPRESSION: HEAD CT 1. No acute intracranial abnormalities. 2. Atrophy and chronic microvascular ischemic change. CERVICAL CT 1. No fracture or acute finding. Electronically Signed   By: Lajean Manes M.D.   On: 08/16/2019 13:55     Gifford Shave, MD 08/18/2019, 6:12 AM PGY-1, [place] Intern pager: [phone], text pages welcome

## 2019-08-19 DIAGNOSIS — Z515 Encounter for palliative care: Secondary | ICD-10-CM

## 2019-08-19 DIAGNOSIS — Z7189 Other specified counseling: Secondary | ICD-10-CM

## 2019-08-19 LAB — BASIC METABOLIC PANEL
Anion gap: 10 (ref 5–15)
BUN: 25 mg/dL — ABNORMAL HIGH (ref 8–23)
CO2: 23 mmol/L (ref 22–32)
Calcium: 8.1 mg/dL — ABNORMAL LOW (ref 8.9–10.3)
Chloride: 106 mmol/L (ref 98–111)
Creatinine, Ser: 1.57 mg/dL — ABNORMAL HIGH (ref 0.61–1.24)
GFR calc Af Amer: 45 mL/min — ABNORMAL LOW (ref >60)
GFR calc non Af Amer: 39 mL/min — ABNORMAL LOW (ref >60)
Glucose, Bld: 112 mg/dL — ABNORMAL HIGH (ref 70–99)
Potassium: 3.5 mmol/L (ref 3.5–5.1)
Sodium: 139 mmol/L (ref 135–145)

## 2019-08-19 NOTE — Progress Notes (Signed)
PMT consult received, chart reviewed and discussed with attending. Patient assessment completed. Spoke with daughter, [name] via telephone to discuss [place]. Discussed events leading up to admission and course of hospitalization including diagnoses, interventions, plan of care, and poor prognosis. [name] declines SNF placement. [name] wishes to take her father home with support of hospice services. She will need to hire additional caregiver support. TOC team notified to assist with disposition. Patient remains a limited code.   Full palliative note to follow.   NO CHARGE  Ihor Dow, [place], FNP-C Palliative Medicine Team  Phone: [phone] Fax: [phone]

## 2019-08-19 NOTE — Progress Notes (Signed)
Physical Therapy Treatment Patient Details Name: [name] Elliott MRN: [id number] DOB: August 23, 1928 Today's Date: 08/19/2019    History of Present Illness [name] Elliott is a 85 y.o. male presenting with AKI and altered mental status after a fall. PMH is significant for vascular dementia, A. fib, CHF, hypertension, T2DM.    PT Comments    Pt was seen for mobility and is able to assist with transfers to and from bed, but is not predictable to stand or use RW.  Pt is tired out from this type of activity, and requires some time to recover, to restore his ability to move but also to agree to exercise more.  Given his limitations, would recommend SNF to manage his care and lack of strength to do much moving.  Family is expecting to take him home.  Follow up with acute therapy for avoiding limits of ROM and balance on LE's.   Follow Up Recommendations  Supervision/Assistance - 24 hour;SNF     Equipment Recommendations  None recommended by PT    Recommendations for Other Services       Precautions / Restrictions Precautions Precautions: Fall Precaution Comments: monitor cath integrity Restrictions Weight Bearing Restrictions: No    Mobility  Bed Mobility Overal bed mobility: Needs Assistance             General bed mobility comments: declined to get OOB  Transfers                 General transfer comment: declined to get OOB  Ambulation/Gait                 Stairs             Wheelchair Mobility    Modified Rankin (Stroke Patients Only)       Balance                                            Cognition Arousal/Alertness: Lethargic Behavior During Therapy: Flat affect Overall Cognitive Status: No family/caregiver present to determine baseline cognitive functioning Area of Impairment: Problem solving;Awareness;Safety/judgement;Following commands;Memory                     Memory: Decreased recall of  precautions;Decreased short-term memory Following Commands: Follows one step commands inconsistently;Follows one step commands with increased time Safety/Judgement: Decreased awareness of safety;Decreased awareness of deficits Awareness: Intellectual Problem Solving: Slow processing;Requires verbal cues General Comments: increased time to respond to commands but getting better with response time      Exercises General Exercises - Lower Extremity Ankle Circles/Pumps: AAROM;5 reps Heel Slides: AAROM;10 reps Hip ABduction/ADduction: AAROM;10 reps Straight Leg Raises: AAROM;10 reps Hip Flexion/Marching: AROM;AAROM;10 reps    General Comments General comments (skin integrity, edema, etc.): Pt is wearing socks on his feet, has reddened skin RLE greater than LLE, very sensitive to much pressure so moved with care under calf      Pertinent Vitals/Pain Pain Assessment: Faces Pain Score: 0-No pain    Home Living                      Prior Function            PT Goals (current goals can now be found in the care plan section)      Frequency    Min 3X/week      PT Plan  Discharge plan needs to be updated    Co-evaluation              AM-PAC PT "6 Clicks" Mobility   Outcome Measure  Help needed turning from your back to your side while in a flat bed without using bedrails?: A Little Help needed moving from lying on your back to sitting on the side of a flat bed without using bedrails?: A Lot Help needed moving to and from a bed to a chair (including a wheelchair)?: A Lot Help needed standing up from a chair using your arms (e.g., wheelchair or bedside chair)?: A Lot Help needed to walk in hospital room?: A Lot Help needed climbing 3-5 steps with a railing? : Total 6 Click Score: 12    End of Session   Activity Tolerance: Patient limited by fatigue;Patient limited by lethargy Patient left: in bed;with call bell/phone within reach;with bed alarm set Nurse  Communication: Mobility status;Other (comment)(discussed being careful with catheter) PT Visit Diagnosis: Unsteadiness on feet (R26.81);Other abnormalities of gait and mobility (R26.89);History of falling (Z91.81)     Time: [id number] PT Time Calculation (min) (ACUTE ONLY): 13 min  Charges:  $Therapeutic Exercise: 8-22 mins                Ramond Dial 08/19/2019, 4:39 PM   Mee Hives, PT MS Acute Rehab Dept. Number: [place] and [place]

## 2019-08-19 NOTE — Progress Notes (Signed)
Occupational Therapy Treatment Patient Details Name: [name] Elliott MRN: [id number] DOB: [birth year] Today's Date: 08/19/2019    History of present illness [name] Elliott is a 85 y.o. male presenting with AKI and altered mental status after a fall. PMH is significant for vascular dementia, A. fib, CHF, hypertension, T2DM.   OT comments  Pt making progress with functional goals. Pt very pleasant and cooperative. Pt sat EOB for ADL/functionla tasks, Stood at RW min A to SPT min A. Per MD note, pt's daughter considering palliative/hospice care at home. OT will continue to follow acutely  Follow Up Recommendations  Home health OT;Supervision/Assistance - 24 hour    Equipment Recommendations  None recommended by OT    Recommendations for Other Services      Precautions / Restrictions Precautions Precautions: Fall Restrictions Weight Bearing Restrictions: No       Mobility Bed Mobility Overal bed mobility: Needs Assistance Bed Mobility: Sit to Supine     Supine to sit: Min assist Sit to supine: Min assist   General bed mobility comments: increased time. Assist with LEs to EOB, heavy use of rails to elevate trunk  Transfers Overall transfer level: Needs assistance Equipment used: Rolling walker (2 wheeled) Transfers: Sit to/from Stand Sit to Stand: Min assist Stand pivot transfers: Min assist       General transfer comment: slow pace of movement    Balance Overall balance assessment: Needs assistance Sitting-balance support: No upper extremity supported;Feet supported Sitting balance-Leahy Scale: Fair     Standing balance support: During functional activity;Bilateral upper extremity supported Standing balance-Leahy Scale: Poor                             ADL either performed or assessed with clinical judgement   ADL Overall ADL's : Needs assistance/impaired     Grooming: Sitting;Min guard;Wash/dry hands;Wash/dry face   Upper Body Bathing:  Supervision/ safety;Set up;Sitting Upper Body Bathing Details (indicate cue type and reason): simulated Lower Body Bathing: Minimal assistance;Sit to/from stand;Sitting/lateral leans Lower Body Bathing Details (indicate cue type and reason): simulated Upper Body Dressing : Set up;Sitting       Toilet Transfer: Minimal assistance;Stand-pivot;Cueing for safety;Cueing for sequencing;RW Toilet Transfer Details (indicate cue type and reason): simulated bed - recliner Toileting- Clothing Manipulation and Hygiene: Minimal assistance;Sit to/from stand       Functional mobility during ADLs: Minimal assistance;Cueing for safety;Cueing for sequencing;Rolling walker General ADL Comments: pt very pleasant and cooperative     Vision Patient Visual Report: No change from baseline     Perception     Praxis      Cognition Arousal/Alertness: Awake/alert Behavior During Therapy: WFL for tasks assessed/performed Overall Cognitive Status: No family/caregiver present to determine baseline cognitive functioning(hx of cognitive impairments) Area of Impairment: Orientation;Following commands;Safety/judgement                 Orientation Level: Disoriented to;Time;Situation     Following Commands: Follows one step commands consistently;Follows one step commands with increased time Safety/Judgement: Decreased awareness of safety;Decreased awareness of deficits     General Comments: Slow to respond. Knew he was in hospital and that he had fallen at home.        Exercises     Shoulder Instructions       General Comments      Pertinent Vitals/ Pain       Pain Assessment: Faces Faces Pain Scale: Hurts a little bit Pain Location: grimacing at times  with mobility Pain Descriptors / Indicators: Grimacing Pain Intervention(s): Limited activity within patient's tolerance;Monitored during session;Repositioned  Home Living                                          Prior  Functioning/Environment              Frequency  Min 1X/week        Progress Toward Goals  OT Goals(current goals can now be found in the care plan section)  Progress towards OT goals: Progressing toward goals     Plan Discharge plan remains appropriate    Co-evaluation                 AM-PAC OT "6 Clicks" Daily Activity     Outcome Measure   Help from another person eating meals?: None Help from another person taking care of personal grooming?: A Little Help from another person toileting, which includes using toliet, bedpan, or urinal?: A Little Help from another person bathing (including washing, rinsing, drying)?: A Little Help from another person to put on and taking off regular upper body clothing?: A Little Help from another person to put on and taking off regular lower body clothing?: A Lot 6 Click Score: 18    End of Session Equipment Utilized During Treatment: Gait belt;Rolling walker  OT Visit Diagnosis: Unsteadiness on feet (R26.81);Other abnormalities of gait and mobility (R26.89);History of falling (Z91.81);Pain;Other symptoms and signs involving cognitive function Pain - Right/Left: (generalized)   Activity Tolerance Patient tolerated treatment well   Patient Left in bed;with call bell/phone within reach;with bed alarm set   Nurse Communication          Time: [id number] OT Time Calculation (min): 25 min  Charges: OT General Charges $OT Visit: 1 Visit OT Treatments $Self Care/Home Management : 8-22 mins $Therapeutic Activity: 8-22 mins     Britt Bottom 08/19/2019, 12:05 PM

## 2019-08-19 NOTE — Progress Notes (Signed)
Family Medicine Teaching Service Daily Progress Note Intern Pager: [phone]  Patient name: [name] Elliott Medical record number: [id number] Date of birth: 1928/05/06 Age: 85 y.o. Gender: male  Primary Care Provider: Rutherford Guys, MD Consultants: None Code Status: Partial code, no chest compressions, intubation and respiratory support is acceptable.  Clarified with daughter x3  Pt Overview and Major Events to Date:  08/16/2019-patient admitted for AKI and altered mental status   Assessment and Plan: [name] Elliott is a 85 y.o. male presenting with AKI and altered mental status after a fall. PMH is significant for vascular dementia, A. fib, CHF, hypertension, T2DM.  Altered mental status with history of vascular dementia Most recent MoCA evaluation score-12 who ordered Moca due to his education level.  On admission patient was oriented to person and place but not time.  Patient's daughter report that he has become more confused over the past few weeks but notes that over the past week he has been especially confused which she attributes to the fact that she has not been able to visit due to her positive Covid status.  Patient is s/p fall at home, CT head showed no acute intracranial abnormalities but did note atrophy and chronic microvascular ischemic changes.  UA was negative.  EtOH-negative, UDS-negative, lactic acid 2.1> 1.7, high-sensitivity troponins 69> 71> 68.  TSH-4.453.  LFTs within normal limits, total bili mildly elevated at 1.5, indirect bili mildly elevated at 1.1 direct bili mildly elevated at 0.4.  Chest x-ray showed no acute infectious signs.  Patient's mental status is waxing and waning.  He is alert and oriented to person but not place or time. -Social work consult to discuss possible disposition. -Consider repeat Moca -Holding home gabapentin -Frequent neurochecks  -Up with assistance -PT/OT eval and treat -A.m. CBCs, BMP -Palliative care consulted given daughter's request  for patient to go home.  Chest/epigastric discomfort Patient is complaining of sudden onset chest/epigastric pain.  Sternum and epigastric area are tender to palpation.  EKG showed A. fib.  Troponins were ordered.  Per the patient's daughter yesterday she said that the patient has been complaining of burning when he eats in his chest and this may just be a reflux/GERD issue.  Given the EKG findings we will still follow-up on troponins but ACS is unlikely. Repeat troponin was 38  -Continue Protonix 40 mg twice daily -Consider GI consult inpatient versus outpatient  Recent fall Patient reportedly had a unwitnessed fall out of chair that was heard by daughter.  Daughter denies any loss of consciousness and he was immediately responsive.  Patient did have some waxing and waning responsiveness a few minutes after she had called 911.  In the ED a head wound was addressed and repaired with Dermabond.  Rib x-ray showed no acute rib fractures, CT head was negative, CT cervical was negative. -PT OT eval and treat -Fall precautions -Ongoing discussions with daughter about discharge  AKI with CKD 3 Patient has hx of CKD stage III with baseline creatinine of 1.6.  On presentation to the emergency room creatinine was 3.42.  Creatinine 1.57 this morning.  GFR on admission was also lowered to 15 with a baseline in the 30s.. There was concern over the patient's intake over the last week and we believe there is a component of dehydration to his AKI.. CK -209  -Strict I's and O's  -Hold home Lasix -NS at 50 cc/h -Daily weights  -Monitor BMP -Avoid nephrotoxic agents  SOB with known Covid exposure Patient's daughter's Covid  positive.  She reports that the patient was exposed to her prior to her known diagnosis.  He was tested and was negative but it was within the window where he may not be positive yet.  Per daughter the patient has been complaining of shortness of breath over the last week.  Patient was  retested on admission to the ED and was negative.  -Discontinue droplet and contact precautions -Continuous pulse ox  HFrEF Most recent echocardiogram was from 06/2016.  It showed LVEF of 40-45%.  Patient's home medications are Imdur 30 mg daily and Lasix 40 mg daily.  Patient has not taken his medications in over a week.  BNP on arrival is 118.8.   Patient does not appear extremely fluid overloaded on exam. No need for repeat echocardiogram at this time -Holding Lasix at this time due to AKI -Continue home Imdur 30 mg -Strict I's and O's -Continuous cardiac monitoring   Elevated troponins High-sensitivity troponins have been 69>73>68.  Elevation in high-sensitivity troponins is most likely due to demand ischemia.  Patient complained of new chest pain today and so I obtained 1 troponin which was 38. -No need to continue trending troponins  A. fib Follows with [facility]. EKG on admission showed sinus tachycardia with left fascicular block and a regular rhythm.  A. fib is listed on his problem list but the daughter denies it and he is not on any rate control or anticoagulation.  Per chart review patient was on anticoagulation at one point but had frequent GI bleeds which required ED visits so anticoagulation was discontinued. Patient was also prescribed beta-blocker and ACE/ARB at one point but the beta-blocker was discontinued because his rate could not tolerate it and his ACE/ARB was discontinued because his kidney function was worsening.  Patient complained of new sharp chest pain/epigastric pain.  EKG was obtained -monitor on telemetry  -AM EKG showed sinus rhythm with marked sinus arrhythmia with PVCs  -Consider cardiology consult if in changes in the EKG  HTN Patient's blood pressure since admission have ranged from 108/65-142/74.  Most recent blood pressure 136/53. Home medications include Imdur 30 mg daily and Lasix 40 mg daily.  -Monitor blood pressures -Vitals per  routine -Continue Imdur 30 mg daily -Discontinue Lasix 40 mg daily given AKI  T2DM Glucose was 200 on arrival.  Most recent hemoglobin was 07/23/2019 and was 6.9.  Diabetes is managed by diet.  Blood sugar this morning 107. -Monitor blood glucoses off morning BMP  Hypothyroidism Patient takes Synthroid at home.  He is prescribed 50 mg every Tuesday and Saturday and 75 mg all remaining days of the week.  Patient has been noncompliant with his medications over the past week. -TSH -Continue home Synthroid dosage schedule  FEN/GI: NPO until speech eval  PPx: Heparin GGT   Disposition: SNF vs home   Subjective:  Patient is sleeping when I enter the room.  He is easily arousable but does not oriented to place or time.  He is oriented to person.  He reports that he is sore but is otherwise doing well.  Denies any chest pain or shortness of breath  Objective: Temp:  [98 F (36.7 C)-98.4 F (36.9 C)] 98.4 F (36.9 C) (12/16 0508) Pulse Rate:  [62-75] 62 (12/16 0508) Resp:  [16-18] 18 (12/16 0508) BP: (123-127)/(63-74) 123/63 (12/16 0508) SpO2:  [97 %-98 %] 97 % (12/16 0508) General: Resting comfortably HEENT:  Normocephalic, laceration left head, repaired with Dermabond.  Hematoma on left upper extremity  with bandage over skin tear. Cardiac: RRR, no m/r/g Respiratory: Normal work of breathing, patient has expiratory wheeze in the left lung field. Abdomen: soft, nontender, nondistended, bowel sounds normal  Skin: warm and dry Neuro: Alert and oriented to person and place.  Reports the year is 2021 and is unsure of the month or day.   Laboratory: Recent Labs  Lab 08/16/19 1217 08/17/19 0620 08/18/19 0620  WBC 12.6* 9.3 7.7  HGB 15.9 13.6 12.5*  HCT 48.3 40.3 38.4*  PLT 194 169 168   Recent Labs  Lab 08/16/19 1217 08/16/19 1917 08/17/19 0620 08/18/19 0620  NA 136  --  139 141  K 4.3  --  3.4* 3.9  CL 98  --  105 109  CO2 19*  --  20* 23  BUN 85*  --  68* 45*   CREATININE 3.42*  --  2.44* 1.95*  CALCIUM 9.1  --  8.5* 8.4*  PROT  --  6.6  --   --   BILITOT  --  1.5*  --   --   ALKPHOS  --  49  --   --   ALT  --  15  --   --   AST  --  23  --   --   GLUCOSE 200*  --  111* 105*   EKG-Sinus rhythm with marked sinus arrhythmia with premature supraventricular complexes and premature ventricular complexes or fusion complexes  Imaging/Diagnostic Tests: No results found.   Gifford Shave, MD 08/19/2019, 5:59 AM PGY-1, [facility] Intern pager: [phone], text pages welcome

## 2019-08-19 NOTE — Consult Note (Signed)
Consultation Note Date: 08/19/2019   Patient Name: [name] Elliott  DOB: 03/03/1928  MRN: [id number]  Age / Sex: 85 y.o., male  PCP: Rutherford Guys, MD Referring Physician: Zenia Resides, MD  Reason for Consultation: Establishing goals of care  HPI/Patient Profile: 85 y.o. male  with past medical history of vascular dementia, afib, CHF EF 40-45%, CKD 3,  HTN, DM type 2 admitted on 08/16/2019 with AMS and fall. CT head showed no acute intracranial abnormalities but atrophy and chronic microvascular ischemic changes. UA negative. CXR negative. Patient with AKI underlying CKD stage III. Worsening dementia. PT recommending SNF placement. Palliative medicine consultation for goals of care.   Clinical Assessment and Goals of Care:  I have reviewed medical records, discussed with attending, and assessed the patient. Khyle is awake, alert to person and place. Reoriented to time and situation. Pleasant confusion. Assisted with lunch tray. No family at bedside.   Spoke with daughter, [name] via telephone to discuss diagnosis, prognosis, [place], EOL wishes, disposition and options.  Introduced Palliative Medicine as specialized medical care for people living with serious illness. It focuses on providing relief from the symptoms and stress of a serious illness. The goal is to improve quality of life for both the patient and the family.  We discussed a brief life review of the patient. 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 Widowed. Two daughters, one deceased. Prior to hospitalization, patient living home alone. [name] shares that he has "lived on the same piece of land since he was born" and "he wants to die on that piece of land." [name] typically checks in frequently, but has unfortunately had COVID 61 and was quarantined herself from her father for over a week.    Baseline, Mr. Wittich has been declining cognitively, functionally, and nutritionally. The day of admission, he fell.   Discussed course of hospitalization with [name] including diagnoses, intervention, and plan of care. [name] is appreciative of conversations with attending, sharing her understanding that her father may not live longer than six months with worsening dementia. She acknowledges understanding that he is nearing the "end stage of his life."   I attempted to elicit values and goals of care important to the patient and daughter. After much thought, [name] is very clear on her decision against SNF placement, understanding he cannot tolerate intense physical therapy. [name] again speaks of her desire (and father's biggest wish) to "die in his home." She acknowledges he needs 24/7 caregiver support. She works/cares for grandchildren and unable to provide 24/7 support.   Introduced hospice philosophy and home hospice services, emphasizing that they will be great support but also not 24/7 care. [name] is interested in trying to hire private caregivers. Reassured her of assistance from RN CM/SW with discharge.   Patient has a scheduled GI appointment 08/26/19 which he has been waiting for, with ongoing difficulty/pain with swallowing. Daughter will likely want to still take him to this appointment (even though starting hospice services  now) but if recommended, would not was EGD performed. We discussed that GI provider may have good recommendations for symptom management medications and hopefully home hospice agency would not have a problem with her taking him to one appointment.    Advanced directives, concepts specific to code status, artifical feeding and hydration, and rehospitalization were considered and discussed. [name] confirms that her father would NOT want resuscitation or prolonged life support measures. At this point, she would like to continue limited code until she can attempt  further discussions with her father. [name] currently wishes for her father to have ventilator support if is 'heart is still beating' (respiratory distress scenario) but only for a "couple days." Provided education against heroic measures at EOL (including ventilator) explaining that these interventions would mean hospitalization/could die in the hospital, which is not his EOL wish. Explained hospice philosophy and allowing nature to take course in his home. A peaceful, comfortable, dignified death. [name] understands and will consider.   [name] shares that her father is "extremely stubborn and bull-headed." She hopes and prays this process is not prolonged for him, as she knows he "doesn't want to depend on people." She states that he has "lived a long life" and is "ready to go."   Confirmed plan for home hospice services on discharge with caregiver support. Questions and concerns were addressed. PMT contact information given.   SUMMARY OF RECOMMENDATIONS    Patient has lived in the same home his entire life. Daughter very clear on her desire and the patient's EOL wish to die in this same home.   Daughter declines SNF rehab placement. She wishes to initiate home hospice services on discharge, understanding hospice philosophy. TOC team to assist with disposition and private caregiver resources for daughter. Patient needs 24/7 caregiver support.  Continue current plan of care and medical management inpatient.   Continue limited code status. Education provided against all life-saving measures as he nears EOL, with fear that patient will end up in the hospital and potentially die in the hospital (if desiring ventilator support) which is not his EOL wish.   Code Status/Advance Care Planning:  Limited code  Symptom Management:   Per attending  Palliative Prophylaxis:   Aspiration, Delirium Protocol, Frequent Pain Assessment, Oral Care and Turn Reposition  Psycho-social/Spiritual:   Desire  for further Chaplaincy support:yes  Additional Recommendations: Caregiving  Support/Resources, Compassionate Wean Education and Education on Hospice  Prognosis:   Likely less than 6 months with worsening vascular dementia and underlying heart failure. Declining cognitive, functional, and nutritional status. Daughter elects comfort focused care plan and home hospice services.   Discharge Planning: Home with Hospice      Primary Diagnoses: Present on Admission: . AKI (acute kidney injury) ([place]) . Altered mental status   I have reviewed the medical record, interviewed the patient and family, and examined the patient. The following aspects are pertinent.  Past Medical History:  Diagnosis Date  . Arthritis   . Arthropathy, unspecified, site unspecified   . Atrial fibrillation ([place])   . Blood dyscrasia    thromboctopenia pt family states he was never told of this  . CAD (coronary artery disease)    last cath in 2012. Managed medically-some blockages  . Chronic lower back pain   . Chronic systolic CHF (congestive heart failure) ([place]) 06/04/2016  . Constipation, chronic   . Diabetes mellitus without complication ([place])    pt states he doesn't have  . DVT (deep venous thrombosis) ([place])   . Dysrhythmia   .  Esophageal reflux   . 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 MRI Nov '11 - tendonosis and partial tear. Left shoulder surgery Jan '12 for partial shoulder replacement. [name])   . Headache(784.0)   . History of stomach ulcers 11/2012  . Hypothyroidism    pt states he doeen't have   . Orthostasis   . Other B-complex deficiencies   . Other malaise and fatigue   . Pain in joint, shoulder region    Has chronic shoulder pain  . Pain in limb   . Persistent disorder of initiating or maintaining sleep   . Pneumonia 1980's?; 2011  . Primary localized osteoarthritis of left knee 07/12/2015  . Pure hypercholesterolemia   . PVC's (premature ventricular  contractions)   . S/P cardiac cath 08/08/11   mild to moderate CAD primarily in the LAD. None are obstructive and appear stable from prior cath in 2007; managed medically  . Sebaceous cyst   . Stroke ([place])   . Thrombocytopenia, unspecified ([place])   . Unspecified essential hypertension   . Vascular dementia without behavioral disturbance ([facility])    with periods of amnesia   Social History   Socioeconomic History  . Marital status: Widowed    Spouse name: Not on file  . Number of children: 2  . Years of education: 3rd  . Highest education level: Not on file  Occupational History  . Occupation: retired    Fish farm manager: RETIRED  Tobacco Use  . Smoking status: Former Smoker    Types: Cigars    Quit date: 09/03/1978    Years since quitting: 40.9  . Smokeless tobacco: Current User    Types: Chew  . Tobacco comment: 02/17/2013 "I aien't smoked a cigar in 20-30 yr"  Substance and Sexual Activity  . Alcohol use: No    Alcohol/week: 0.0 standard drinks  . Drug use: No  . Sexual activity: Not Currently  Other Topics Concern  . Not on file  Social History Narrative   3rd grade education. Married '58, '95. 2 dtr '59, '64, youngest daughter died septic kidney.    Lives alone, handles all ADLs   Right handed.   1 cup coffee per day, occasional soda or tea.      Has living will   Daughter [name] is health care POA   Would accept resuscitation attempts--- but no prolonged ventilation.   Not sure about tube feeds.            Social Determinants of Health   Financial Resource Strain:   . Difficulty of Paying Living Expenses: Not on file  Food Insecurity:   . Worried About Charity fundraiser in the Last Year: Not on file  . Ran Out of Food in the Last Year: Not on file  Transportation Needs:   . Lack of Transportation (Medical): Not on file  . Lack of Transportation (Non-Medical): Not on file  Physical Activity:   . Days of Exercise per Week: Not on file  . Minutes of Exercise per  Session: Not on file  Stress:   . Feeling of Stress : Not on file  Social Connections:   . Frequency of Communication with Friends and Family: Not on file  . Frequency of Social Gatherings with Friends and Family: Not on file  . Attends Religious Services: Not on file  . Active Member of Clubs or Organizations: Not on file  . Attends Archivist Meetings: Not on file  . Marital Status: Not on  file   Family History  Problem Relation Age of Onset  . Breast cancer Mother   . Cancer Mother   . Diabetes Mother   . Cancer Brother        throat  . Stroke Brother   . Kidney disease Father   . Diabetes Sister   . Diabetes Daughter   . Colon cancer Neg Hx   . Heart attack Neg Hx    Scheduled Meds: . aspirin EC  81 mg Oral Daily  . heparin  5,000 Units Subcutaneous Q8H  . isosorbide mononitrate  30 mg Oral Daily  . levothyroxine  50 mcg Oral Once per day on Tue Sat   And  . levothyroxine  75 mcg Oral Once per day on Sun Mon Wed Thu Fri  . pantoprazole  40 mg Oral BID   Continuous Infusions: PRN Meds:.acetaminophen Medications Prior to Admission:  Prior to Admission medications   Medication Sig Start Date End Date Taking? Authorizing Provider  acetaminophen (TYLENOL) 500 MG tablet Take 1,000 mg by mouth every 6 (six) hours as needed for mild pain.   Yes [provider]  aspirin EC 81 MG tablet Take 81 mg by mouth daily.    Yes [provider]  Aspirin-Acetaminophen-Caffeine (GOODY HEADACHE PO) Take 1 Package by mouth as needed (headache).   Yes [provider]  bismuth subsalicylate (PEPTO BISMOL) 262 MG/15ML suspension Take 30 mLs by mouth daily as needed for indigestion or diarrhea or loose stools.    Yes [provider]  docusate sodium (COLACE) 100 MG capsule Take 1 capsule (100 mg total) by mouth every 12 (twelve) hours. Patient taking differently: Take 100 mg by mouth daily as needed for mild constipation.  07/29/18  Yes Harris,  Abigail, PA-C  famotidine (PEPCID) 20 MG tablet Take 1 tablet (20 mg total) by mouth 2 (two) times daily. 07/23/19  Yes Rutherford Guys, MD  furosemide (LASIX) 40 MG tablet Take 1 tablet (40 mg total) by mouth daily. 07/03/18  Yes Nahser, Wonda Cheng, MD  gabapentin (NEURONTIN) 300 MG capsule Take 2 capsules (600 mg total) by mouth 3 (three) times daily. 07/23/19  Yes Rutherford Guys, MD  isosorbide mononitrate (IMDUR) 30 MG 24 hr tablet Take 1 tablet (30 mg total) by mouth daily. 10/02/18  Yes Nahser, Wonda Cheng, MD  levothyroxine (SYNTHROID) 50 MCG tablet Take 1 tablet (50 mcg) every Tuesday and Saturday morning before breakfast. Take 75 mcg on all other days. 07/27/19  Yes Rutherford Guys, MD  levothyroxine (SYNTHROID) 75 MCG tablet Take 1 tablet (73mcg) every Monday, Wednesday, Thursday, Friday and Sunday morning before breakfast. Take 50 mcg on all other days 07/27/19  Yes Rutherford Guys, MD  magnesium hydroxide (MILK OF MAGNESIA) 400 MG/5ML suspension Take 15 mLs by mouth daily as needed for mild constipation or moderate constipation.   Yes [provider]  nitroGLYCERIN (NITROSTAT) 0.4 MG SL tablet Place 1 tablet (0.4 mg total) under the tongue every 5 (five) minutes as needed for chest pain. 07/03/18  Yes Nahser, Wonda Cheng, MD  omeprazole (PRILOSEC) 20 MG capsule Take 1 capsule (20 mg total) by mouth 2 (two) times daily before a meal. 07/01/18  Yes McVey, Gelene Mink, PA-C  potassium chloride SA (KLOR-CON) 20 MEQ tablet Take 1 tablet (20 mEq total) by mouth 2 (two) times daily. 07/23/19  Yes Rutherford Guys, MD   Allergies  Allergen Reactions  . Crestor [Rosuvastatin Calcium] Other (See Comments)  Muscle pain/aches  . Mestinon [Pyridostigmine Bromide Er] Other (See Comments)    GI upset, stomach cramps  . Xarelto [Rivaroxaban] Other (See Comments)    bleeding   Review of Systems  Constitutional: Positive for activity change and appetite change.  Gastrointestinal:        Epigastric pain   Physical Exam Vitals and nursing note reviewed.  Constitutional:      General: He is awake.     Appearance: He is ill-appearing.  HENT:     Head: Normocephalic and atraumatic.  Pulmonary:     Effort: No tachypnea, accessory muscle usage or respiratory distress.  Abdominal:     Tenderness: There is no abdominal tenderness.  Skin:    General: Skin is warm and dry.  Neurological:     Mental Status: He is alert.     Comments: Oriented to person/place. Reoriented to situation/time  Psychiatric:        Mood and Affect: Mood normal.        Speech: Speech normal.        Behavior: Behavior normal.    Vital Signs: BP 126/66 (BP Location: Left Arm)   Pulse 66   Temp 98.6 F (37 C) (Oral)   Resp 18   Wt 64.9 kg   SpO2 98%   BMI 24.55 kg/m  Pain Scale: 0-10   Pain Score: 0-No pain   SpO2: SpO2: 98 % O2 Device:SpO2: 98 % O2 Flow Rate: .   IO: Intake/output summary:   Intake/Output Summary (Last 24 hours) at 08/19/2019 1203 Last data filed at 08/19/2019 0900 Gross per 24 hour  Intake 1570.48 ml  Output 1325 ml  Net 245.48 ml    LBM: Last BM Date: (PTA) Baseline Weight: Weight: 64.9 kg Most recent weight: Weight: 64.9 kg     Palliative Assessment/Data: PPS 40%     Time In/Out: 1140-1200, 1520-1610 Time Total: 53min Greater than 50%  of this time was spent counseling and coordinating care related to the above assessment and plan.  Signed by:  Ihor Dow, DNP, FNP-C Palliative Medicine Team  Phone: [phone] Fax: [phone]   Please contact Palliative Medicine Team phone at [phone] for questions and concerns.  For individual provider: See Shea Evans

## 2019-08-20 DIAGNOSIS — Z515 Encounter for palliative care: Secondary | ICD-10-CM

## 2019-08-20 NOTE — Progress Notes (Signed)
[facility] hospital liaison Bevely Palmer received request from [place] for family interest in hospice services at home after discharge. Chart reviewed and spoke with daughter [name] Elliott after 5 PM when she returned my call. Chart information being reviewed by hospice physician. Eligibility pending at this time.   DME needs discussed with [name]. She prefers to make DME decision in the morning. Plan to follow up in am. She is uncertain of discharge date and states she is getting his house ready for him to return home.   [facility] referral center specialist will contact [facility] to arrange first visit in the home.   Please call with hospice related questions.   Thank you,  Erling Conte, LCSW [phone]

## 2019-08-20 NOTE — Care Management Important Message (Signed)
Important Message  Patient Details  Name: [name] Elliott MRN: [id number] Date of Birth: November 22, 1927   Medicare Important Message Given:  Yes     [name] 08/20/2019, 11:14 AM

## 2019-08-20 NOTE — TOC Progression Note (Signed)
Transition of Care [facility]) - Progression Note    Patient Details  Name: [name] Elliott MRN: [id number] Date of Birth: 17-Mar-1928  [facility] CM/SW Contact  Rae Mar, RN Phone Number: 08/20/2019, 2:24 PM  Clinical Narrative:     CM noted palliatives comments and plan of care in chart.  CM called pt's daughter and left a VM at 11:43 today. After no return phone call, CM called again at 14:21 and was sent to VM, again requesting a return call.  TOC team will continue to try to speak with pt's daughter for transition planning.  Expected Discharge Plan: Home w Hospice Care(and PCS) Barriers to Discharge: Continued Medical Work up  Expected Discharge Plan and Services Expected Discharge Plan: Home w Hospice Care(and PCS)                                               Social Determinants of Health (SDOH) Interventions    Readmission Risk Interventions No flowsheet data found.

## 2019-08-20 NOTE — Progress Notes (Addendum)
Family Medicine Teaching Service Daily Progress Note Intern Pager: [phone]  Patient name: [name] Elliott Medical record number: [id number] Date of birth: [birth year] Age: 85 y.o. Gender: male  Primary Care Provider: Rutherford Guys, MD Consultants: None Code Status: Partial code, no chest compressions, intubation and respiratory support is acceptable.  Clarified with daughter x3  Pt Overview and Major Events to Date:  08/16/2019-patient admitted for AKI and altered mental status   Assessment and Plan: [name] is a 85 y.o. male presenting with AKI and altered mental status after a fall. PMH is significant for vascular dementia, A. fib, CHF, hypertension, T2DM.  Altered mental status with history of vascular dementia Most recent MoCA evaluation score-12 who ordered Moca due to his education level.  On admission patient was oriented to person and place but not time.  Patient's daughter report that he has become more confused over the past few weeks but notes that over the past week he has been especially confused which she attributes to the fact that she has not been able to visit due to her positive Covid status.  Patient is s/p fall at home, CT head showed no acute intracranial abnormalities but did note atrophy and chronic microvascular ischemic changes.  Lab work came back negative for infectious etiologies.  Patient's mental status is waxing and waning.  He is alert and oriented to person but not place or time. -Social work consult to discuss possible disposition. -Holding home gabapentin -Frequent neurochecks  -Up with assistance -PT/OT eval and treat -A.m. CBCs, BMP -Palliative care consulted given daughter's request for patient to go home.  They discussed with the daughter options and daughter wishes for patient to go home with home hospice. -Plan for patient to be discharged with home hospice services. -Check with daughter regarding caregiver support  Chest/epigastric  discomfort Patient is complaining of sudden onset chest/epigastric pain.  Sternum and epigastric area are tender to palpation.  EKG showed A. fib.  Troponins were ordered.  Per the patient's daughter yesterday she said that the patient has been complaining of burning when he eats in his chest and this may just be a reflux/GERD issue.  Given the EKG findings we will still follow-up on troponins but ACS is unlikely. Repeat troponin was 38  -Continue Protonix 40 mg twice daily -Consider GI consult inpatient versus outpatient  Recent fall Patient reportedly had a unwitnessed fall out of chair that was heard by daughter.  Daughter denies any loss of consciousness and he was immediately responsive.  Patient did have some waxing and waning responsiveness a few minutes after she had called 911.  In the ED a head wound was addressed and repaired with Dermabond.  Rib x-ray showed no acute rib fractures, CT head was negative, CT cervical was negative. -PT OT eval and treat -Fall precautions -Ongoing discussions with daughter about discharge  AKI with CKD 3 Patient has hx of CKD stage III with baseline creatinine of 1.6.  On presentation to the emergency room creatinine was 3.42.  Creatinine 1.57 this morning.  GFR on admission was also lowered to 15 with a baseline in the 30s.. There was concern over the patient's intake over the last week and we believe there is a component of dehydration to his AKI.. CK -209  -Strict I's and O's  -Hold home Lasix -NS at 50 cc/h -Daily weights  -Monitor BMP -Avoid nephrotoxic agents  SOB with known Covid exposure Patient's daughter's Covid positive.  She reports that the patient was exposed  to her prior to her known diagnosis.  He was tested and was negative but it was within the window where he may not be positive yet.  Per daughter the patient has been complaining of shortness of breath over the last week.  Patient was retested on admission to the ED and was negative.   -Discontinue droplet and contact precautions -Continuous pulse ox  HFrEF Most recent echocardiogram was from 06/2016.  It showed LVEF of 40-45%.  Patient's home medications are Imdur 30 mg daily and Lasix 40 mg daily.  Patient has not taken his medications in over a week.  BNP on arrival is 118.8.   Patient does not appear extremely fluid overloaded on exam. No need for repeat echocardiogram at this time -Holding Lasix at this time due to AKI -Continue home Imdur 30 mg -Strict I's and O's -Continuous cardiac monitoring   Elevated troponins High-sensitivity troponins have been 69>73>68.  Elevation in high-sensitivity troponins is most likely due to demand ischemia.  Patient complained of new chest pain today and so I obtained 1 troponin which was 38. -No need to continue trending troponins  A. fib Follows with [facility]. EKG on admission showed sinus tachycardia with left fascicular block and a regular rhythm.  A. fib is listed on his problem list but the daughter denies it and he is not on any rate control or anticoagulation.  Per chart review patient was on anticoagulation at one point but had frequent GI bleeds which required ED visits so anticoagulation was discontinued. Patient was also prescribed beta-blocker and ACE/ARB at one point but the beta-blocker was discontinued because his rate could not tolerate it and his ACE/ARB was discontinued because his kidney function was worsening.  Patient complained of new sharp chest pain/epigastric pain.  EKG was obtained -monitor on telemetry  -AM EKG showed sinus rhythm with marked sinus arrhythmia with PVCs  -Consider cardiology consult if in changes in the EKG  HTN Patient's blood pressure since admission have ranged from 108/65-142/74.  Most recent blood pressure 136/53. Home medications include Imdur 30 mg daily and Lasix 40 mg daily.  -Monitor blood pressures -Vitals per routine -Continue Imdur 30 mg daily -Discontinue Lasix 40  mg daily given AKI  T2DM Glucose was 200 on arrival.  Most recent hemoglobin was 07/23/2019 and was 6.9.  Diabetes is managed by diet.  Blood sugar this morning 107. -Monitor blood glucoses off morning BMP  Hypothyroidism Patient takes Synthroid at home.  He is prescribed 50 mg every Tuesday and Saturday and 75 mg all remaining days of the week.  Patient has been noncompliant with his medications over the past week. -TSH -Continue home Synthroid dosage schedule  FEN/GI: Dysphagia 1 diet PPx: Heparin GGT   Disposition: Home with home hospice once it is set up  Subjective:  Patient is resting comfortably when I enter the room.  He says that he is sore but otherwise doing okay.  He is alert and oriented to person and place but not time.  Denies any chest pain but is saying that his throat hurts today.  Objective: Temp:  [97.8 F (36.6 C)-98.6 F (37 C)] 97.9 F (36.6 C) (12/17 0343) Pulse Rate:  [41-68] 57 (12/17 0343) Resp:  [16-18] 16 (12/17 0343) BP: (101-126)/(56-66) 101/56 (12/17 0343) SpO2:  [96 %-99 %] 99 % (12/17 0343) Weight:  [70.1 kg] 70.1 kg (12/16 2022) General: Resting comfortably HEENT:  Normocephalic, laceration on left temporal region that was repaired with Dermabond Cardiac: RRR,  no m/r/g Respiratory: Normal work of breathing, patient has expiratory wheeze in the left lung field. Abdomen: soft, nontender, nondistended, bowel sounds normal  Skin: warm and dry Neuro: Alert and oriented to person and place.  Is unsure of the year or month.  Laboratory: Recent Labs  Lab 08/16/19 1217 08/17/19 0620 08/18/19 0620  WBC 12.6* 9.3 7.7  HGB 15.9 13.6 12.5*  HCT 48.3 40.3 38.4*  PLT 194 169 168   Recent Labs  Lab 08/16/19 1917 08/17/19 0620 08/18/19 0620 08/19/19 0629  NA  --  139 141 139  K  --  3.4* 3.9 3.5  CL  --  105 109 106  CO2  --  20* 23 23  BUN  --  68* 45* 25*  CREATININE  --  2.44* 1.95* 1.57*  CALCIUM  --  8.5* 8.4* 8.1*  PROT 6.6  --    --   --   BILITOT 1.5*  --   --   --   ALKPHOS 49  --   --   --   ALT 15  --   --   --   AST 23  --   --   --   GLUCOSE  --  111* 105* 112*   EKG-Sinus rhythm with marked sinus arrhythmia with premature supraventricular complexes and premature ventricular complexes or fusion complexes  Imaging/Diagnostic Tests: No results found.   Gifford Shave, MD 08/20/2019, 6:12 AM PGY-1, [place] Intern pager: [phone], text pages welcome

## 2019-08-20 NOTE — TOC Progression Note (Signed)
Transition of Care [facility]) - Progression Note    Patient Details  Name: Ellwood Eade MRN: [id number] Date of Birth: 12/08/1927  Transition of [facility]) CM/SW Contact  Rae Mar, RN Phone Number: 08/20/2019, 3:11 PM  Clinical Narrative:     CM received a return call from Mrs. Joneen Caraway.  We discussed the various hospice agencies in the area.  She choose [facility].  CM also discussed DME and PCS.  She reports that pt has a cane and walker and that his house would not fit a wheelchair.  She further thinks that they will put a hospital bed in the living room and close off the rest of the house except the kitchen and bathroom.  Pt still chops his own wood for a wood stove but has a heating and cooling unit that would be ok for those two room.  She will work with [employer] on [employer].  She also thinks she will have the neighbors come check on the pt regularly when she cannot be there as well as install cameras in the home and get him a life alert.  CM advised that [facility] would be contacting her shortly to start setting everything up for him to come home.  CM contacted Audrea Muscat with [facility] who will reach out to Mrs. Joneen Caraway now.  The needed DME will likely not arrive to the house until tomorrow.  CM updated TOC team and will follow for further needs.  Expected Discharge Plan: Home w Hospice Care(and PCS) Barriers to Discharge: Continued Medical Work up  Expected Discharge Plan and Services Expected Discharge Plan: Home w Hospice Care(and PCS)                                               Social Determinants of Health (SDOH) Interventions    Readmission Risk Interventions No flowsheet data found.

## 2019-08-20 NOTE — Progress Notes (Signed)
  Speech Language Pathology Treatment: Dysphagia  Patient Details Name: [name] Elliott MRN: [id number] DOB: 10/23/1927 Today's Date: 08/20/2019 Time: 1347-1400 SLP Time Calculation (min) (ACUTE ONLY): 13 min  Assessment / Plan / Recommendation Clinical Impression  Skilled treatment session focused on skilled observation of pt consuming advanced textures. Pt demonstrated prolonged mastication and decreased bolus manipulation of soft solid bolus. He perseveratively turn the bolus over and over but didn't transition bolus posteriorly. Pt was not able to follow cues for liquid wash or cues to increase bolus management. He also began falling asleep with entire bolus of soft solids in his mouth. Pt responsive to Max verbal and visual cues to spit the bolus out. After spitting out the more solid particles, pt consumed thin liquids via straw with no overt s/s of aspiration. Recommend pt remain in puree diet d/t difficulty with oral phase as well as deficits in pt's mentation. Education provided to pt's nurse on current recommendation. ST to follow.    HPI HPI: 85yo male admitted 08/16/2019 with AKI, AMS, fall. PMH: vascular dementia, AFib, CHF, HTN, DM2. 12/30 on recent [place] with current plan of care       Recommendations  Diet recommendations: Thin liquid;Dysphagia 1 (puree) Liquids provided via: Cup;Straw Medication Administration: Crushed with puree Supervision: Staff to assist with self feeding;Patient able to self feed Compensations: Slow rate;Small sips/bites Postural Changes and/or Swallow Maneuvers: Seated upright 90 degrees                Oral Care Recommendations: Oral care BID Follow up Recommendations: 24 hour supervision/assistance SLP Visit Diagnosis: Dysphagia, unspecified (R13.10) Plan: Continue with current plan of care       GO                Cookie Pore 08/20/2019, 3:57 PM

## 2019-08-21 MED ORDER — ASPIRIN EC 81 MG PO TBEC
81.0000 mg | DELAYED_RELEASE_TABLET | Freq: Every day | ORAL | Status: DC
  Administered 2019-08-21 – 2019-08-25 (×5): 81 mg via ORAL
  Filled 2019-08-21 (×5): qty 1

## 2019-08-21 NOTE — Progress Notes (Signed)
FPTS Interim Progress Note  Spoke to patient's daughter over the phone to give an update.  Ask her if she had any questions.  Daughter reported to me that she would likely be able to take patient home tomorrow.  He is being very adamant that he would like to go home.  She knows that that is his wishes to die at home so she is trying to accommodate this.  She is very worried that he does not have 24-hour supervision at home and she can only provide care for so long since she is also working.  Is planning on putting cameras up around the house.  I explained my concern of him falling and his high fall risk especially with aspirin use.  She states he understands this concern but knows that it is her his father's wishes to die at home.  Her dad reports she feels "exhausted".  He is coming later today to the hospital to talk to him as well.  Social work is currently working on home hospice.  Spoke to hospice LCSW Erling Conte).  They will need to have discussion with daughter in order to have hospital bed delivered in time.  Daughter already has her number in stated that she would call with any updates.  Caroline More, DO 08/21/2019, 2:04 PM PGY-3, [place] Medicine Service pager [phone]

## 2019-08-21 NOTE — Progress Notes (Signed)
[facility] eligibility confirmed by hospice physician.   Spoke with daughter [name] by phone this morning. She is requesting hospital bed with OBT to be delivered to address on face sheet. She does not have help to rearrange things before the weekend and she is requesting DME delivery on Tuesday. She is the contact for DME delivery and knows to be expecting a call. She has [facility] contact information [phone]) if needed.  [facility] liaison will continue to follow and help coordinate through discharge.   Please do no hesitate to call with questions.  [facility] liaisons are listed daily on [facility]ION under Hospice and [place].  Thank you,  Erling Conte, LCSW [phone]

## 2019-08-21 NOTE — Plan of Care (Signed)
  Problem: Clinical Measurements: Goal: Ability to maintain clinical measurements within normal limits will improve Outcome: Progressing   

## 2019-08-21 NOTE — Progress Notes (Signed)
Physical Therapy Treatment Patient Details Name: [name] Elliott MRN: [id number] DOB: Jul 07, 1928 Today's Date: 08/21/2019    History of Present Illness [name] Elliott is a 85 y.o. male presenting with AKI and altered mental status after a fall. PMH is significant for vascular dementia, A. fib, CHF, hypertension, T2DM.    PT Comments    Pt is tolerating nearly no therapy now, and is not mobile in several days with PT other than the bed ex.  Had Tylenol per nsg which is what is ordered for his pain, and nurse is aware of the struggle pt is having with comfort.  Follow up with him acutely to work toward more independence with transfers and gait as tolerated.     Follow Up Recommendations  Supervision/Assistance - 24 hour;SNF     Equipment Recommendations  None recommended by PT    Recommendations for Other Services       Precautions / Restrictions Precautions Precautions: Fall Restrictions Weight Bearing Restrictions: No    Mobility  Bed Mobility Overal bed mobility: Needs Assistance Bed Mobility: (rolling and scooting)           General bed mobility comments: max assist to try to move with pt objecting and making repositioning to get comfortable impossible  Transfers                 General transfer comment: declined  Ambulation/Gait                 Stairs             Wheelchair Mobility    Modified Rankin (Stroke Patients Only)       Balance                                            Cognition Arousal/Alertness: Lethargic Behavior During Therapy: Flat affect Overall Cognitive Status: No family/caregiver present to determine baseline cognitive functioning Area of Impairment: Following commands;Safety/judgement                 Orientation Level: Situation   Memory: Decreased short-term memory Following Commands: Follows one step commands inconsistently;Follows one step commands with increased  time Safety/Judgement: Decreased awareness of safety;Decreased awareness of deficits Awareness: Intellectual Problem Solving: Slow processing;Requires verbal cues        Exercises General Exercises - Lower Extremity Ankle Circles/Pumps: 5 reps;AAROM Heel Slides: AAROM;5 reps Hip ABduction/ADduction: AAROM;5 reps    General Comments        Pertinent Vitals/Pain Pain Assessment: Faces Faces Pain Scale: Hurts even more Pain Location: B lower legs, generally on lower body Pain Descriptors / Indicators: Grimacing Pain Intervention(s): Limited activity within patient's tolerance;Monitored during session;Premedicated before session;Repositioned    Home Living                      Prior Function            PT Goals (current goals can now be found in the care plan section) Progress towards PT goals: Not progressing toward goals - comment    Frequency    Min 3X/week      PT Plan Discharge plan needs to be updated    Co-evaluation              AM-PAC PT "6 Clicks" Mobility   Outcome Measure  Help needed turning from your back to your side  while in a flat bed without using bedrails?: A Lot Help needed moving from lying on your back to sitting on the side of a flat bed without using bedrails?: A Lot Help needed moving to and from a bed to a chair (including a wheelchair)?: A Lot Help needed standing up from a chair using your arms (e.g., wheelchair or bedside chair)?: Total Help needed to walk in hospital room?: Total Help needed climbing 3-5 steps with a railing? : Total 6 Click Score: 9    End of Session   Activity Tolerance: Patient limited by pain Patient left: in bed;with call bell/phone within reach;with bed alarm set Nurse Communication: Mobility status;Other (comment)       Time: [id number] PT Time Calculation (min) (ACUTE ONLY): 13 min  Charges:  $Therapeutic Exercise: 8-22 mins                Ramond Dial 08/21/2019, 8:53 PM   Mee Hives, PT MS Acute Rehab Dept. Number: [place] [facility] [place]

## 2019-08-21 NOTE — Progress Notes (Addendum)
Family Medicine Teaching Service Daily Progress Note Intern Pager: [phone]  Patient name: [name] Elliott Medical record number: [id number] Date of birth: 01-08-1928 Age: 85 y.o. Gender: male  Primary Care Provider: Rutherford Guys, MD Consultants: None Code Status: Partial code, no chest compressions, intubation and respiratory support is acceptable.  Clarified with daughter x3  Pt Overview and Major Events to Date:  08/16/2019-patient admitted for AKI and altered mental status   Assessment and Plan: [name] Elliott is a 85 y.o. male presenting with AKI and altered mental status after a fall. PMH is significant for vascular dementia, A. fib, CHF, hypertension, T2DM.  Altered mental status with history of vascular dementia Most recent MoCA evaluation score-12 who ordered [facility] due to his education level.  On admission patient was oriented to person and place but not time.  Patient's daughter report that he has become more confused over the past few weeks but notes that over the past week he has been especially confused which she attributes to the fact that she has not been able to visit due to her positive Covid status.  Patient is s/p fall at home, CT head showed no acute intracranial abnormalities but did note atrophy and chronic microvascular ischemic changes.  Lab work came back negative for infectious etiologies.  Patient's mental status is waxing and waning.  Patient is more alert this morning and oriented to person and time but not place. -Social work consult to discuss possible disposition. -Holding home gabapentin -Frequent neurochecks  -Up with assistance -PT/OT eval and treat -Palliative care consulted given daughter's request for patient to go home.  They discussed with the daughter options and daughter wishes for patient to go home with home hospice. -Plan for patient to be discharged with home hospice services. -Check with daughter regarding caregiver support -Per hospice patient  will discuss DME needs this morning.  Chest/epigastric discomfort Patient is no longer complaining of chest pain.  Reports he is doing well and has some throat pain occasionally with eating. -Continue Protonix 40 mg twice daily     Recent fall Patient reportedly had a unwitnessed fall out of chair that was heard by daughter.  Daughter denies any loss of consciousness and he was immediately responsive.  Patient did have some waxing and waning responsiveness a few minutes after she had called 911.  In the ED a head wound was addressed and repaired with Dermabond.  Rib x-ray showed no acute rib fractures, CT head was negative, CT cervical was negative. -PT OT eval and treat -Fall precautions -Ongoing discussions with daughter about discharge  AKI with CKD 3-resolved Patient has hx of CKD stage III with baseline creatinine of 1.6.  Was elevated but this since resolved. -Avoid nephrotoxic agents  SOB with known Covid exposure Patient's daughter's Covid positive.  She reports that the patient was exposed to her prior to her known diagnosis.  He was tested and was negative but it was within the window where he may not be positive yet.  Per daughter the patient has been complaining of shortness of breath over the last week.  Patient was retested on admission to the ED and was negative.  -Discontinue droplet and contact precautions -Continuous pulse ox  HFrEF Most recent echocardiogram was from 06/2016.  It showed LVEF of 40-45%.  Patient's home medications are Imdur 30 mg daily and Lasix 40 mg daily.  Patient has not taken his medications in over a week.  BNP on arrival is 118.8.   Patient does not  appear extremely fluid overloaded on exam. No need for repeat echocardiogram at this time -Holding Lasix at this time due to AKI -Continue home Imdur 30 mg  Elevated troponins High-sensitivity troponins have been 69>73>68.  Elevation in high-sensitivity troponins is most likely due to demand ischemia.   Patient complained of new chest pain today and so I obtained 1 troponin which was 38. -No need to continue trending troponins  A. fib Follows with [facility]. EKG on admission showed sinus tachycardia with left fascicular block and a regular rhythm.  A. fib is listed on his problem list but the daughter denies it and he is not on any rate control or anticoagulation.  Per chart review patient was on anticoagulation at one point but had frequent GI bleeds which required ED visits so anticoagulation was discontinued. Patient was also prescribed beta-blocker and ACE/ARB at one point but the beta-blocker was discontinued because his rate could not tolerate it and his ACE/ARB was discontinued because his kidney function was worsening.  Patient complained of new sharp chest pain/epigastric pain.  EKG was obtained -monitor on telemetry  -AM EKG showed sinus rhythm with marked sinus arrhythmia with PVCs  -Consider cardiology consult if in changes in the EKG  HTN Patient's blood pressure over the last 24 hours have been 103/64-122/64.  Most recent blood pressure 101/64. Home medications include Imdur 30 mg daily and Lasix 40 mg daily.  -Monitor blood pressures -Vitals per routine -Continue Imdur 30 mg daily -Discontinue Lasix 40 mg daily given AKI  T2DM Glucose was 200 on arrival.  Most recent hemoglobin was 07/23/2019 and was 6.9.  Diabetes is managed by diet.  Blood sugar this morning 107. -Monitor blood glucoses off morning BMP  Hypothyroidism Patient takes Synthroid at home.  He is prescribed 50 mg every Tuesday and Saturday and 75 mg all remaining days of the week.  Patient has been noncompliant with his medications over the past week. -TSH -Continue home Synthroid dosage schedule  FEN/GI: Dysphagia 1 diet PPx: Heparin GGT   Disposition: Home with home hospice once it is set up  Subjective:  Patient reports he is doing okay this morning.  Is alert and oriented to person and time  but not place.  Has no questions or concerns  Objective: Temp:  [97.8 F (36.6 C)-98.7 F (37.1 C)] 97.8 F (36.6 C) (12/18 0542) Pulse Rate:  [66-105] 66 (12/18 0542) Resp:  [18] 18 (12/18 0542) BP: (103-122)/(57-68) 110/57 (12/18 0542) SpO2:  [96 %-98 %] 96 % (12/18 0542) Weight:  [70.1 kg] 70.1 kg (12/17 2125) General: Laying comfortably in bed HEENT:  Normocephalic, laceration on left temporal region that was repaired with Dermabond  Cardiac: RRR, no m/r/g Respiratory: Normal work of breathing, clear to auscultation laterally Abdomen: soft, nontender, nondistended, bowel sounds normal  Skin: warm and dry Neuro: Alert and oriented to person and year and month but not day  Laboratory: Recent Labs  Lab 08/16/19 1217 08/17/19 0620 08/18/19 0620  WBC 12.6* 9.3 7.7  HGB 15.9 13.6 12.5*  HCT 48.3 40.3 38.4*  PLT 194 169 168   Recent Labs  Lab 08/16/19 1917 08/17/19 0620 08/18/19 0620 08/19/19 0629  NA  --  139 141 139  K  --  3.4* 3.9 3.5  CL  --  105 109 106  CO2  --  20* 23 23  BUN  --  68* 45* 25*  CREATININE  --  2.44* 1.95* 1.57*  CALCIUM  --  8.5* 8.4*  8.1*  PROT 6.6  --   --   --   BILITOT 1.5*  --   --   --   ALKPHOS 49  --   --   --   ALT 15  --   --   --   AST 23  --   --   --   GLUCOSE  --  111* 105* 112*   EKG-Sinus rhythm with marked sinus arrhythmia with premature supraventricular complexes and premature ventricular complexes or fusion complexes  Imaging/Diagnostic Tests: No results found.   Gifford Shave, MD 08/21/2019, 6:22 AM PGY-1, [place] Intern pager: [phone], text pages welcome

## 2019-08-22 LAB — BASIC METABOLIC PANEL
Anion gap: 9 (ref 5–15)
BUN: 18 mg/dL (ref 8–23)
CO2: 25 mmol/L (ref 22–32)
Calcium: 8.1 mg/dL — ABNORMAL LOW (ref 8.9–10.3)
Chloride: 104 mmol/L (ref 98–111)
Creatinine, Ser: 1.59 mg/dL — ABNORMAL HIGH (ref 0.61–1.24)
GFR calc Af Amer: 44 mL/min — ABNORMAL LOW (ref >60)
GFR calc non Af Amer: 38 mL/min — ABNORMAL LOW (ref >60)
Glucose, Bld: 113 mg/dL — ABNORMAL HIGH (ref 70–99)
Potassium: 3.3 mmol/L — ABNORMAL LOW (ref 3.5–5.1)
Sodium: 138 mmol/L (ref 135–145)

## 2019-08-22 NOTE — Progress Notes (Addendum)
FMTS Attending Daily Note: Dorris Singh, MD  Team Pager [phone] Pager [phone]  I have seen and examined this patient, reviewed their chart. I have discussed this patient with the resident. I agree with the resident's findings, assessment and care plan. Addended note below.   Dementia, with malnutrition and numerous comorbidities. - Awaiting transfer to home with hospice. Patient has one daughter who works and has had difficulty establishing safe place at home for patient. Patient and daughter very much want patient home. Daughter setting up home today---will work with family to ensure safe discharge.   Family Medicine Teaching Service Daily Progress Note Intern Pager: [phone]  Patient name: [name] Elliott Medical record number: [id number] Date of birth: 1928-05-16 Age: 85 y.o. Gender: male  Primary Care Provider: Rutherford Guys, MD Consultants: None Code Status: Partial code, no chest compressions, intubation and respiratory support is acceptable.  Clarified with daughter x3  Pt Overview and Major Events to Date:  08/16/2019-patient admitted for AKI and altered mental status   Assessment and Plan: [name] Elliott is a 85 y.o. male presenting with AKI and altered mental status after a fall. PMH is significant for vascular dementia, A. fib, CHF, hypertension, T2DM.  Altered mental status with history of vascular dementia Acute. Resolved. Patient responded appropriately to all my questions this morning. However, looking at past notes patient mental status does have waxing and waning. Patient is s/p fall at home, CT head showed no acute intracranial abnormalities but did note atrophy and chronic microvascular ischemic changes.  Lab work came back negative for infectious etiologies. -Holding home gabapentin -Up with assistance -PT/OT eval and treat - no recs -Palliative care consulted: home with hospice care -Plan for patient to be discharged with home hospice services and will receive a  hospital bed that will be delivered to the home.   Chest/epigastric discomfort Patient is no longer complaining of chest pain and had none this morning.  Reports he is doing well and has some throat pain occasionally with eating. -Continue Protonix 40 mg twice daily    Recent fall Patient reportedly had a unwitnessed fall out of chair that was heard by daughter.  Daughter denies any loss of consciousness and he was immediately responsive.  Patient did have some waxing and waning responsiveness a few minutes after she had called 911.  In the ED a head wound was addressed and repaired with Dermabond.  Rib x-ray showed no acute rib fractures, CT head was negative, CT cervical was negative. - Cont Fall precautions  AKI with CKD 3-resolved Chronic. Stable. Patient has hx of CKD stage III with baseline creatinine of 1.6.  Was elevated but this since resolved. Serum Creatinine of 1.59 this am which is baseline. Will discontinue further labs.  -Avoid nephrotoxic agents   HFrEF Chronic. Stable. BP have been normotensive to low since admission. Most recent echocardiogram was from 06/2016.  It showed LVEF of 40-45%.  Patient's home medications are Imdur 30 mg daily and Lasix 40 mg daily.  Patient has not taken his medications in over a week.  BNP on arrival is 118.8.   Patient does not appear extremely fluid overloaded on exam. No need for repeat echocardiogram at this time -Cont to hold Lasix at this time due to normotensive/low BPs -Continue home Imdur 30 mg  A. fib Chronic. Stable. Follows with [facility]. EKG on admission showed sinus tachycardia with left fascicular block and a regular rhythm.  A. fib is listed on his problem list but the  daughter denies it and he is not on any rate control or anticoagulation.  Per chart review patient was on anticoagulation at one point but had frequent GI bleeds which required ED visits so anticoagulation was discontinued. Patient was also prescribed  beta-blocker and ACE/ARB at one point but the beta-blocker was discontinued because his rate could not tolerate it and his ACE/ARB was discontinued because his kidney function was worsening.  Patient complained of new sharp chest pain/epigastric pain.  EKG was obtained. Will monitor for symptoms.   HTN Patient's blood pressure over the last 24 hours have been 101/64-115/58.  Most recent blood pressure 115/58. Home medications include Imdur 30 mg daily and Lasix 40 mg daily.  Monitor for symptoms.   T2DM Glucose was 200 on arrival.  Most recent hemoglobin was 07/23/2019 and was 6.9.  Diabetes is managed by diet.  Blood sugar this morning 113. -Monitor blood glucoses off morning BMP  Hypothyroidism Patient takes Synthroid at home.  He is prescribed 50 mg every Tuesday and Saturday and 75 mg all remaining days of the week.  Patient has been noncompliant with his medications over the past week. -TSH -Continue home Synthroid dosage schedule  FEN/GI: Dysphagia 1 diet will discuss diet with daughter---this limits his ability to enjoy meals. Will discuss aspiration risk vs. Liberalizing diet.  PPx: Heparin GGT   Disposition: Home with home hospice once it is set up  Subjective:  Patient reports he is doing okay this morning and still desires to go home. He does state his right knee is hurting "a little bit" but this has been an issue since his fall.   Objective: Temp:  [98.1 F (36.7 C)-98.6 F (37 C)] 98.1 F (36.7 C) (12/19 0458) Pulse Rate:  [68-110] 79 (12/19 0458) Resp:  [18] 18 (12/19 0458) BP: (101-115)/(58-68) 115/58 (12/19 0458) SpO2:  [95 %-98 %] 96 % (12/19 0458) Gen: Alert and Oriented to person and place, NAD CV: RRR, no murmurs, normal S1, S2 split Resp: CTAB, no wheezing, rales, or rhonchi, comfortable work of breathing Abd: non-distended, non-tender, soft, +bs in all four quadrants Ext: no clubbing, cyanosis, or edema Skin: bruising on left forearm    Laboratory: Recent Labs  Lab 08/16/19 1217 08/17/19 0620 08/18/19 0620  WBC 12.6* 9.3 7.7  HGB 15.9 13.6 12.5*  HCT 48.3 40.3 38.4*  PLT 194 169 168   Recent Labs  Lab 08/16/19 1917 08/18/19 0620 08/19/19 0629 08/22/19 0526  NA  --  141 139 138  K  --  3.9 3.5 3.3*  CL  --  109 106 104  CO2  --  23 23 25   BUN  --  45* 25* 18  CREATININE  --  1.95* 1.57* 1.59*  CALCIUM  --  8.4* 8.1* 8.1*  PROT 6.6  --   --   --   BILITOT 1.5*  --   --   --   ALKPHOS 49  --   --   --   ALT 15  --   --   --   AST 23  --   --   --   GLUCOSE  --  105* 112* 113*   EKG-Sinus rhythm with marked sinus arrhythmia with premature supraventricular complexes and premature ventricular complexes or fusion complexes  Imaging/Diagnostic Tests: No results found.   Nuala Alpha, DO 08/22/2019, 8:03 AM PGY-3, [place] Intern pager: [phone], text pages welcome

## 2019-08-22 NOTE — Progress Notes (Signed)
Spoke with Daughter regarding patient's code status.  Given that goal is for patient to die at home with hospice and he is aspiration risk, would recommend no intubation in order for patient to continue soft diet and not return to hospital if aspirates.  This was discussed with his daughter and she agreed that ultimately his goal is to die at home and she does not want him to return to the hospital if he aspirates while eating.  She states that she would like for him to by DNR to honor his wishes and allow him to die at home.  Code status changed in chart.  She reports that she is actively working on preparing the home for him to return as soon as possible.  Will continue to stay in touch and discharge patient home with hospice when she is ready.  [place] Constable, D.O.  PGY-2 Family Medicine  08/22/2019 3:02 PM

## 2019-08-22 NOTE — Discharge Instructions (Signed)
Hospice Hospice is a service that is designed to provide people who are terminally ill and their families with medical, spiritual, and psychological support. Its aim is to improve your quality of life by keeping you as comfortable as possible in the final stages of life. Who will be my providers when I begin hospice care? Hospice teams often include:  A nurse.  A doctor. The hospice doctor will be available for your care, but you can include your regular doctor or nurse practitioner.  A social worker.  A counselor.  A religious leader (such as a chaplain).  A dietitian.  Therapists.  Trained volunteers who can help with care. What services does hospice provide? Hospice services can vary depending on the center or organization. Generally, they include:  Ways to keep you comfortable, such as: ? Providing care in your home or in a home-like setting. ? Working with your family and friends to help meet your needs. ? Allowing you to enjoy the support of loved ones by receiving much of your basic care from family and friends.  Pain relief and symptom management. The staff will supply all necessary medicines and equipment so that you can stay comfortable and alert enough to enjoy the company of your friends and family.  Visits or care from a nurse and doctor. This may include 24-hour on-call services.  Companionship when you are alone.  Allowing you and your family to rest. Hospice staff may do light housekeeping, prepare meals, and run errands.  Counseling. They will make sure your emotional, spiritual, and social needs are being met, as well as those needs of your family members.  Spiritual care. This will be individualized to meet your needs and your family's needs. It may involve: ? Helping you and your family understand the dying process. ? Helping you say goodbye to your family and friends. ? Performing a specific religious ceremony or ritual.  Massage.  Nutrition  therapy.  Physical and occupational therapy.  Short-term inpatient care, if something cannot be managed in the home.  Art or music therapy.  Bereavement support for grieving family members. When should hospice care begin? Most people who use hospice are believed to have less than 6 months to live.  Your family and health care providers can help you decide when hospice services should begin.  If you live longer than 6 months but your condition does not improve, your doctor may be able to approve you for continued hospice care.  If your condition improves, you may discontinue the program. What should I consider before selecting a program? Most hospice programs are run by nonprofit, independent organizations. Some are affiliated with hospitals, nursing homes, or home health care agencies. Hospice programs can take place in your home or at a hospice center, hospital, or skilled nursing facility. When choosing a hospice program, ask the following questions:  What services are available to me?  What services will be offered to my loved ones?  How involved will my loved ones be?  How involved will my health care provider be?  Who makes up the hospice care team? How are they trained or screened?  How will my pain and symptoms be managed?  If my circumstances change, can the services be provided in a different setting, such as my home or in the hospital?  Is the program reviewed and licensed by the state or certified in some other way?  What does it cost? Is it covered by insurance?  If I choose a hospice   center or nursing home, where is the hospice center located? Is it convenient for family and friends?  If I choose a hospice center or nursing home, can my family and friends visit any time?  Will you provide emotional and spiritual support?  Who can my family call with questions? Where can I learn more about hospice? You can learn about existing hospice programs in your area  from your health care providers. You can also read more about hospice online. The websites of the following organizations have helpful information:  [facility] and Palliative Care Organization [facility]): http://www.brown-buchanan.com/  National Association for [facility]): http://massey-hart.com/  Hospice Foundation of [place] ([place]): www.hospicefoundation.org  American Cancer Society (ACS): www.cancer.org  Hospice Net: www.hospicenet.org  Visiting Nurse Associations of [place] (VNAA): www.vnaa.org You may also find more information by contacting the following agencies:  A local agency on aging.  Your local [employer] chapter.  Your state's department of health or social services. Summary  Hospice is a service that is designed to provide people who are terminally ill and their families with medical, spiritual, and psychological support.  Hospice aims to improve your quality of life by keeping you as comfortable as possible in the final stages of life.  Hospice teams often include a doctor, nurse, social worker, counselor, religious leader,dietitian, therapists, and volunteers.  Hospice care generally includes medicine for symptom management, visits from doctors and nurses, physical and occupational therapy, nutrition counseling, spiritual and emotional counseling, caregiver support, and bereavement support for grieving family members.  Hospice programs can take place in your home or at a hospice center, hospital, or skilled nursing facility. This information is not intended to replace advice given to you by your health care provider. Make sure you discuss any questions you have with your health care provider. Document Released: 12/07/2003 Document Revised: 08/02/2017 Document Reviewed: 09/11/2016 Elsevier Patient Education  2020 [employer].

## 2019-08-23 NOTE — Progress Notes (Signed)
Family Medicine Teaching Service Daily Progress Note Intern Pager: [phone]  Patient name: [name] Elliott Medical record number: [id number] Date of birth: October 08, 1927 Age: 85 y.o. Gender: male  Primary Care Provider: Rutherford Guys, MD Consultants: None Code Status: DNR  Pt Overview and Major Events to Date:  08/16/2019-patient admitted for AKI and altered mental status   Assessment and Plan: [name] Elliott is a 85 y.o. male presenting with AKI and altered mental status after a fall. PMH is significant for vascular dementia, A. fib, CHF, hypertension, T2DM.  Altered mental status with history of vascular dementia Waxing and waning. Patient is s/p fall at home, CT head showed no acute intracranial abnormalities but did note atrophy and chronic microvascular ischemic changes. No evidence of infectious etiology.  Physical therapy recommending SNF or 24-hour supervision.  Occupational Therapy recommending home health with 24-hour supervision. -Holding home gabapentin -Up with assistance -Palliative care consulted: home with hospice care -Plan for patient to be discharged with home hospice services and will receive a hospital bed that will be delivered to the home.  Chest/epigastric discomfort No complaints of chest pain this morning. -Continue Protonix 40 mg twice daily    Unwitnessed fall at home Patient reportedly had a unwitnessed fall out of chair that was heard by daughter.  Daughter denies any loss of consciousness and he was immediately responsive.  Patient did have some waxing and waning responsiveness a few minutes after she had called 911.  In the ED a head wound was addressed and repaired with Dermabond.  Rib x-ray showed no acute rib fractures, CT head was negative, CT cervical was negative. - Cont Fall precautions  AKI with CKD 3-resolved Baseline creatinine of 1.6.  Creatinine on 12/19 was 1.59. -Avoid further lab draws -Avoid nephrotoxic agents  HFrEF Chronic. Stable.  BP have been normotensive to low since admission. Most recent echocardiogram was from 06/2016.  It showed LVEF of 40-45%.  Patient's home medications are Imdur 30 mg daily and Lasix 40 mg daily.  Patient has not taken his medications in over a week.  BNP on arrival is 118.8.   Patient does not appear extremely fluid overloaded on exam. No need for repeat echocardiogram at this time -Cont to hold Lasix at this time due to normotensive/low BPs -Continue home Imdur 30 mg  A. fib, chronic, stable Follows with [facility]. Admission EKG showed sinus tachycardia with left fascicular block and a regular rhythm.  History of A. fib but no medications at this time.  Per chart review, he was on anticoagulation at one point but had frequent GI bleeds which required ED visits so anticoagulation was discontinued. He was prescribed beta-blocker previously but the beta-blocker was discontinued for intolerance. -monitor for symptoms.   HTN Home medications include Imdur 30 mg daily and Lasix 40 mg daily. Systolic pressure [id number], diastolic pressure [id number] in the past 24 hours. -Hold Lasix -Continue Imdur -Monitor for symptoms of hypotension  T2DM Glucose was 200 on arrival.  Most recent hemoglobin was 07/23/2019 and was 6.9.  Diabetes is managed by diet.  Blood sugar this morning 113. -Monitor blood glucoses off morning BMP  Hypothyroidism Patient takes Synthroid at home.  He is prescribed 50 mg every Tuesday and Saturday and 75 mg all remaining days of the week.  Patient has been noncompliant with his medications over the past week. -TSH -Continue home Synthroid dosage schedule  FEN/GI: Soft food diet PPx: Heparin GGT   Disposition: Home with home hospice once it is set  up  Subjective:  No acute events overnight.  No new complaints this morning.  He did mention that he felt generally crummy but was unable to specify any focal complaints.  Objective: Temp:  [98.1 F (36.7 C)-98.9 F (37.2 C)]  98.1 F (36.7 C) (12/20 0132) Pulse Rate:  [72-90] 85 (12/20 0132) Resp:  [18-20] 18 (12/20 0132) BP: (101-115)/(48-58) 112/48 (12/20 0132) SpO2:  [96 %-98 %] 98 % (12/20 0132)  General: Alert and oriented to person place and year.  Cooperative with the exam and appropriately interactive.  Cognitively slow without answering appropriately. Cardio: Normal S1 and S2, no S3 or S4. Rhythm is regular. No murmurs or rubs.  Pulm: Clear to auscultation bilaterally, no crackles, wheezing, or diminished breath sounds. Normal respiratory effort Abdomen: Bowel sounds normal. Abdomen soft and non-tender.  Extremities: No peripheral edema. Warm/ well perfused.  Strong radial and pedal pulses. Neuro: Cranial nerves grossly intact  Laboratory: Recent Labs  Lab 08/16/19 1217 08/17/19 0620 08/18/19 0620  WBC 12.6* 9.3 7.7  HGB 15.9 13.6 12.5*  HCT 48.3 40.3 38.4*  PLT 194 169 168   Recent Labs  Lab 08/16/19 1917 08/18/19 0620 08/19/19 0629 08/22/19 0526  NA  --  141 139 138  K  --  3.9 3.5 3.3*  CL  --  109 106 104  CO2  --  23 23 25   BUN  --  45* 25* 18  CREATININE  --  1.95* 1.57* 1.59*  CALCIUM  --  8.4* 8.1* 8.1*  PROT 6.6  --   --   --   BILITOT 1.5*  --   --   --   ALKPHOS 49  --   --   --   ALT 15  --   --   --   AST 23  --   --   --   GLUCOSE  --  105* 112* 113*    Imaging/Diagnostic Tests: No results found.   Matilde Haymaker, MD 08/23/2019, 4:46 AM PGY-2, [place] Intern pager: [phone], text pages welcome

## 2019-08-23 NOTE — Progress Notes (Addendum)
Manufacturing engineer [facility]): RN note @1345   RN attempt outreach to daughter [name] today however only able to leave a HIPAA approved voice message request a call back.   [facility] liaison will continue to follow and assist with discharge planning needs.  Please do no hesitate to inquire with any related questions or concerns.  Addendum: Daughter [name] returned call and confirmed she is able to receive the delivery of DME- hospital bed with OBT and also requested a wheelchair and rollator if possible for a deliver today or tomorrow. Liaison has updated [place] (April) on this request. Contact number were provided to follow up with caregiver for arrangements.    Raina Mina, RN, BSN [place] [phone]  [facility] liaisons are listed on [place].

## 2019-08-24 ENCOUNTER — Telehealth: Payer: Self-pay | Admitting: Family Medicine

## 2019-08-24 NOTE — Progress Notes (Signed)
Family Medicine Teaching Service Daily Progress Note Intern Pager: [phone]  Patient name: [name] Elliott Medical record number: [id number] Date of birth: Nov 18, 1927 Age: 85 y.o. Gender: male  Primary Care Provider: Rutherford Guys, MD Consultants: None Code Status: DNR  Pt Overview and Major Events to Date:  08/16/2019-patient admitted for AKI and altered mental status   Assessment and Plan: [name] Elliott is a 85 y.o. male presenting with AKI and altered mental status after a fall. PMH is significant for vascular dementia, A. fib, CHF, hypertension, T2DM.  Altered mental status with history of vascular dementia Waxing and waning. Patient is s/p fall at home, CT head showed no acute intracranial abnormalities but did note atrophy and chronic microvascular ischemic changes. No evidence of infectious etiology. Stable for discharge home with hospice. Awaiting daughter to do house renovations for safety purposes. Will contact daughter today. -Holding home gabapentin -Up with assistance -Palliative care consulted: home with hospice care -Plan for patient to be discharged with home hospice services and will receive a hospital bed that will be delivered to the home.  Chest/epigastric discomfort No complaints of chest pain this morning. -Continue Protonix 40 mg twice daily    Unwitnessed fall at home Patient reportedly had a unwitnessed fall out of chair that was heard by daughter.  Daughter denies any loss of consciousness and he was immediately responsive.  Patient did have some waxing and waning responsiveness a few minutes after she had called 911.  In the ED a head wound was addressed and repaired with Dermabond.  Rib x-ray showed no acute rib fractures, CT head was negative, CT cervical was negative. - Cont Fall precautions  AKI with CKD 3-resolved Baseline creatinine of 1.6.  Creatinine on 12/19 was 1.59. -Avoid further lab draws -Avoid nephrotoxic agents  HFrEF Chronic. Stable.  BP have been normotensive to low since admission. Most recent echocardiogram was from 06/2016.  It showed LVEF of 40-45%.  Patient's home medications are Imdur 30 mg daily and Lasix 40 mg daily.  Patient has not taken his medications in over a week.  BNP on arrival is 118.8.   Patient does not appear extremely fluid overloaded on exam. No need for repeat echocardiogram at this time -Cont to hold Lasix at this time due to normotensive/low BPs -Continue home Imdur 30 mg  A. fib, chronic, stable Follows with [facility]. Admission EKG showed sinus tachycardia with left fascicular block and a regular rhythm.  History of A. fib but no medications at this time.  Per chart review, he was on anticoagulation at one point but had frequent GI bleeds which required ED visits so anticoagulation was discontinued. He was prescribed beta-blocker previously but the beta-blocker was discontinued for intolerance. -monitor for symptoms.   HTN Home medications include Imdur 30 mg daily and Lasix 40 mg daily. Systolic pressure [id number], diastolic pressure [id number] in the past 24 hours. -Hold Lasix -Continue Imdur -Monitor for symptoms of hypotension  T2DM Glucose was 200 on arrival.  Most recent hemoglobin was 07/23/2019 and was 6.9.  Diabetes is managed by diet.  Blood sugar this morning 113. -Monitor blood glucoses off morning BMP  Hypothyroidism Patient takes Synthroid at home.  He is prescribed 50 mg every Tuesday and Saturday and 75 mg all remaining days of the week.  Patient has been noncompliant with his medications over the past week. -TSH -Continue home Synthroid dosage schedule  FEN/GI: Soft food diet PPx: Heparin GGT   Disposition: Home with home hospice. Stable for  discharge  Subjective:  He is doing well. No discomfort or concerns at this time.   Objective: Temp:  [97.8 F (36.6 C)-98.7 F (37.1 C)] 97.8 F (36.6 C) (12/21 0408) Pulse Rate:  [62-83] 80 (12/21 0408) Resp:  [16-18] 16  (12/21 0408) BP: (105-121)/(48-67) 121/67 (12/21 0408) SpO2:  [95 %-97 %] 96 % (12/21 0408)  General: Appears well, no acute distress. Age appropriate. Lying in bed supine. Cardiac: RRR, distant heart sounds, no murmurs Respiratory: CTAB, normal effort Extremities: No edema or cyanosis. Skin: Warm and dry, no rashes noted Neuro: alert and oriented x1, no focal deficits Psych: normal affect   Laboratory: Recent Labs  Lab 08/18/19 0620  WBC 7.7  HGB 12.5*  HCT 38.4*  PLT 168   Recent Labs  Lab 08/18/19 0620 08/19/19 0629 08/22/19 0526  NA 141 139 138  K 3.9 3.5 3.3*  CL 109 106 104  CO2 23 23 25   BUN 45* 25* 18  CREATININE 1.95* 1.57* 1.59*  CALCIUM 8.4* 8.1* 8.1*  GLUCOSE 105* 112* 113*    Imaging/Diagnostic Tests: No results found.   Gerlene Fee, DO 08/24/2019, 2:27 PM PGY-1, [place] Intern pager: [phone], text pages welcome

## 2019-08-24 NOTE — Telephone Encounter (Signed)
[facility] Called to let provider know that the pt is being discharged from [place] to home with recommendation for hospice  Verbal order to asses for hospice Will provider sign as attending provider moving forward   Please advise ASAP

## 2019-08-24 NOTE — Plan of Care (Signed)
  Problem: Education: Goal: Knowledge of General Education information will improve Description: Including pain rating scale, medication(s)/side effects and non-pharmacologic comfort measures Outcome: Progressing   Problem: Safety: Goal: Ability to remain free from injury will improve Outcome: Progressing   Problem: Skin Integrity: Goal: Risk for impaired skin integrity will decrease Outcome: Progressing   

## 2019-08-24 NOTE — TOC Progression Note (Signed)
Transition of Care [facility]) - Progression Note    Patient Details  Name: Bernard Banken MRN: [id number] Date of Birth: Jan 22, 1928  Transition of Care [facility]) CM/SW Contact  Bartholomew Crews, RN Phone Number: [phone] 08/24/2019, 1:49 PM  Clinical Narrative:    Spoke with hospice liaison. DME ordered today, but pending delivery. Attempted to contact daughter, [name], to discuss transition needs. Left voice mail with NCM contact information.    Expected Discharge Plan: Home w Hospice Care(and PCS) Barriers to Discharge: Continued Medical Work up  Expected Discharge Plan and Services Expected Discharge Plan: Home w Hospice Care(and PCS)                                               Social Determinants of Health (SDOH) Interventions    Readmission Risk Interventions No flowsheet data found.

## 2019-08-24 NOTE — Progress Notes (Signed)
Occupational Therapy Treatment Patient Details Name: [name] Elliott MRN: [id number] DOB: 11/14/1927 Today's Date: 08/24/2019    History of present illness [name] Elliott is a 85 y.o. male presenting with AKI and altered mental status after a fall. PMH is significant for vascular dementia, A. fib, CHF, hypertension, T2DM.   OT comments  Patient seated in recliner upon arrival. Patient perseverating throughout session about having his stockings put on, points to the floor and his bed for OT to don stockings however none are present at this time. Max cues to redirect to OT session. Pt set up in chair to wash his face. Attempt to have patient perform functional ambulation/transfer training in his room however having increased difficulty due to patient's cognition. With max multimodal cues for body mechanics pt min A with sit to stand x2 attempts. Patient sits quickly back into recliner despite verbal cues to progress patient to take a few steps. Will continue to follow with acute OT to progress with POC.    Follow Up Recommendations  Home health OT;Supervision/Assistance - 24 hour;Other (comment)(vs SNF pending progress with mobility, 24/7 supervision availability)    Equipment Recommendations  None recommended by OT       Precautions / Restrictions Precautions Precautions: Fall Restrictions Weight Bearing Restrictions: No       Mobility Bed Mobility               General bed mobility comments: patient seated in recliner upon arrival  Transfers Overall transfer level: Needs assistance Equipment used: Rolling walker (2 wheeled) Transfers: Sit to/from Stand Sit to Stand: Min assist         General transfer comment: sit to stand performed x2, unable to advance patient to take steps, sits back onto chair within few seconds of standing. poor safety with body mechanics    Balance Overall balance assessment: Needs assistance Sitting-balance support: Feet supported;No upper  extremity supported Sitting balance-Leahy Scale: Fair     Standing balance support: During functional activity;Bilateral upper extremity supported Standing balance-Leahy Scale: Poor Standing balance comment: requires UE support                           ADL either performed or assessed with clinical judgement   ADL Overall ADL's : Needs assistance/impaired   Eating/Feeding Details (indicate cue type and reason): attempt to have patient take bite of breakfast, appears patient has not eaten anything however pt refusing stating "that's not mine" Grooming: Dance movement psychotherapist;Set up;Sitting Grooming Details (indicate cue type and reason): pt able to thoroughly wash his face seated in chair                 Toilet Transfer: Minimal assistance;Cueing for safety;Cueing for sequencing Toilet Transfer Details (indicate cue type and reason): simulated with sit to stand, unable to advance patient to take some steps         Functional mobility during ADLs: Minimal assistance;Cueing for safety;Cueing for sequencing;Rolling walker General ADL Comments: patient is very distracted requiring max cues to redirect               Cognition Arousal/Alertness: Awake/alert Behavior During Therapy: Restless Overall Cognitive Status: No family/caregiver present to determine baseline cognitive functioning Area of Impairment: Orientation;Following commands;Safety/judgement;Awareness;Memory                 Orientation Level: Disoriented to;Place;Time;Situation(would not provide his birth year, stated month/day)   Memory: Decreased short-term memory Following Commands: Follows one step commands inconsistently;Follows  one step commands with increased time Safety/Judgement: Decreased awareness of safety;Decreased awareness of deficits Awareness: Intellectual Problem Solving: Slow processing;Decreased initiation;Difficulty sequencing;Requires verbal cues;Requires tactile cues General  Comments: patient perseverating on his "stockings" which OT did not see, patient pointing to the bed stating "the black stockings" difficulty with redirection                   Pertinent Vitals/ Pain       Pain Assessment: Faces Faces Pain Scale: No hurt         Frequency  Min 1X/week        Progress Toward Goals  OT Goals(current goals can now be found in the care plan section)  Progress towards OT goals: Progressing toward goals  Acute Rehab OT Goals Patient Stated Goal: did not state OT Goal Formulation: Patient unable to participate in goal setting Time For Goal Achievement: 08/31/19 Potential to Achieve Goals: Good ADL Goals Pt Will Perform Grooming: with supervision;standing Pt Will Perform Upper Body Bathing: with supervision;sitting;standing Pt Will Perform Lower Body Bathing: with supervision;sit to/from stand Pt Will Perform Upper Body Dressing: with supervision;sitting;standing Pt Will Perform Lower Body Dressing: with supervision;sit to/from stand Pt Will Transfer to Toilet: with supervision;ambulating;bedside commode Pt Will Perform Toileting - Clothing Manipulation and hygiene: with supervision;sit to/from stand Additional ADL Goal #1: Pt will be S in and OOB for basic ADLs  Plan Discharge plan remains appropriate       AM-PAC OT "6 Clicks" Daily Activity     Outcome Measure   Help from another person eating meals?: A Little Help from another person taking care of personal grooming?: A Little Help from another person toileting, which includes using toliet, bedpan, or urinal?: A Little Help from another person bathing (including washing, rinsing, drying)?: A Little Help from another person to put on and taking off regular upper body clothing?: A Little Help from another person to put on and taking off regular lower body clothing?: A Lot 6 Click Score: 17    End of Session Equipment Utilized During Treatment: Rolling walker  OT Visit Diagnosis:  Unsteadiness on feet (R26.81);Other abnormalities of gait and mobility (R26.89);History of falling (Z91.81);Other symptoms and signs involving cognitive function   Activity Tolerance Other (comment)(treatment limited secondary to cognition)   Patient Left in chair;with call bell/phone within reach;with chair alarm set   Nurse Communication Mobility status        Time: [id number] OT Time Calculation (min): 19 min  Charges: OT General Charges $OT Visit: 1 Visit OT Treatments $Self Care/Home Management : 8-22 mins  [place] OT office: [place] 08/24/2019, 1:10 PM

## 2019-08-24 NOTE — Plan of Care (Signed)
  Problem: Activity: Goal: Risk for activity intolerance will decrease Outcome: Progressing   Problem: Elimination: Goal: Will not experience complications related to bowel motility Outcome: Completed/Met

## 2019-08-24 NOTE — Progress Notes (Signed)
FPTS Interim Progress Note  S: Spoke w/ daughter Ms. Joneen Caraway and she is ready for her father to come home tomorrow. The hospital bed, wheelchair, and bedside commode has been delivered to the home. She will just need to get sheets for the hospital bed. She understands that he will need assistance with getting up. I also suggested a busy board and books to redirect him when he wants to continue to get up by himself. She explained that Mr. 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 I suggested picture books as an alternative.   A/P: Mr. Redder is stable for discharge home with hospice care and his daughter is prepared to receive him. He will need 24 hour supervision and assistance with transfers. Busy boards and picture books may be a useful distraction to his need to move about in an unsafe way. He is easily redirected and is non-agitated. -Anticipate discharge tomorrow 08/25/2019  Gerlene Fee, DO 08/24/2019, 5:29 PM PGY-1, [place] Medicine Service pager [phone]

## 2019-08-24 NOTE — TOC Progression Note (Signed)
Transition of Care [facility]) - Progression Note    Patient Details  Name: [name] Elliott MRN: [id number] Date of Birth: 11-04-1927  [facility]) CM/SW Contact  Bartholomew Crews, RN Phone Number: [phone] 08/24/2019, 5:39 PM  Clinical Narrative:    Received call back from patient's daughter, [name]. DME has been delivered. She states that she still needs to get a set of sheets. Discussed caregiver assistance - [name] will be staying with patient in his home, however, she is having difficulty arranging for caregivers while she works. Verified with [facility] that no PCS assistance available. Suggested Higher education careers adviser for list of agencies or other possible assistance. [name] is concerned that patient will refuse to pay out of pocket for caregiving services. Checked [employer] with [name]'s consent, however, caregiving assistance is not an offered resource. Discussed anticipated transition home tomorrow. Patient will need PTAR transport. TOC team following for transition needs.     Expected Discharge Plan: Home w Hospice Care(and PCS) Barriers to Discharge: Continued Medical Work up  Expected Discharge Plan and Services Expected Discharge Plan: Home w Hospice Care(and PCS)                                               Social Determinants of Health (SDOH) Interventions    Readmission Risk Interventions No flowsheet data found.

## 2019-08-24 NOTE — Progress Notes (Signed)
  Speech Language Pathology Treatment: Dysphagia  Patient Details Name: [name] Elliott MRN: [id number] DOB: 13-May-1928 Today's Date: 08/24/2019 Time: [id number] SLP Time Calculation (min) (ACUTE ONLY): 16 min  Assessment / Plan / Recommendation Clinical Impression  Skilled assessment with intake of thin via straw/mech soft consistency without dentures available and decreased mastication noted with soft solid/extended manipulation of soft solid, but no overt s/s of aspiration noted with either consistency assessed; pt is distractible and impulsive which is concerning with intake of current diet re: swallowing safety without full supervision with meals; pt upgraded to soft diet when SLP recommendations were for D3/thin d/t cognitive deficits.  ST will f/u x1 for diet tolerance of current diet/education re: swallowing safety while in acute setting.  HPI HPI: 85yo male admitted 08/16/2019 with AKI, AMS, fall. PMH: vascular dementia, AFib, CHF, HTN, DM2. 12/30 on recent [place] with current plan of care       Recommendations  Diet recommendations: Thin liquid;Dysphagia 3 (mechanical soft) Liquids provided via: Cup;Straw Medication Administration: Crushed with puree Supervision: Staff to assist with self feeding;Patient able to self feed Compensations: Slow rate;Small sips/bites Postural Changes and/or Swallow Maneuvers: Seated upright 90 degrees                Oral Care Recommendations: Oral care BID Follow up Recommendations: 24 hour supervision/assistance SLP Visit Diagnosis: Dysphagia, unspecified (R13.10) Plan: Continue with current plan of care                      Elvina Sidle, M.S., CCC-SLP 08/24/2019, 11:28 AM

## 2019-08-25 MED ORDER — RISPERIDONE 0.5 MG PO TBDP: 0.5000 mg | ORAL_TABLET | Freq: Every day | ORAL | 0 refills | Status: AC | PRN

## 2019-08-25 NOTE — TOC Progression Note (Addendum)
Transition of Care [facility]) - Progression Note    Patient Details  Name: [name] Elliott MRN: [id number] Date of Birth: [birth year]  Transition of Care [facility]) CM/SW Contact  Bartholomew Crews, RN Phone Number: [phone] 08/25/2019, 11:44 AM  Clinical Narrative:    Spoke with patient's daughter, [name] Elliott, about possible caregiver assistance through [facility]. Contact information provided to [place]. [name] agreed to her information being provided to [place] as well. Anticipate transition home today with hospice services provided by [facility]. Will need PTAR. TOC following for transition needs.    Expected Discharge Plan: Home w Hospice Care(and PCS) Barriers to Discharge: Continued Medical Work up  Expected Discharge Plan and Services Expected Discharge Plan: Home w Hospice Care(and PCS)                                               Social Determinants of Health (SDOH) Interventions    Readmission Risk Interventions No flowsheet data found.

## 2019-08-25 NOTE — Telephone Encounter (Signed)
Called again today requesting verbal order to asses for hospice, pt to be released tomorrow.   [facility] to be called [phone]

## 2019-08-25 NOTE — Progress Notes (Signed)
DISCHARGE NOTE  [name] Elliott to be discharged Home per MD order. Patient verbalized understanding.  Skin clean, dry and intact without evidence of skin break down, no evidence of skin tears noted. IV catheter discontinued intact. Site without signs and symptoms of complications. Dressing and pressure applied. Pt denies pain at the site currently. No complaints noted.  Patient free of lines, drains, and wounds.   Discharge packet assembled. An After Visit Summary (AVS) was printed and given to the EMS personnel. Patient escorted via stretcher and discharged to [facility] via ambulance.  All questions and concerns addressed.   Dolores Hoose, RN

## 2019-08-25 NOTE — TOC Transition Note (Signed)
Transition of Care [facility]) - CM/SW Discharge Note   Patient Details  Name: Cranston Atiles MRN: [id number] Date of Birth: 10-Apr-1928  Transition of Care [facility]) CM/SW Contact:  Bartholomew Crews, RN Phone Number: [phone] 08/25/2019, 3:10 PM   Clinical Narrative:    DC order received. Liaison for [facility] notified. PTAR transport arranged. Daughter, [name], notified of transport set up. [name] asks to be contacted when PTAR arrives to pick up. Bedside RN made aware. DNR form on chart for transport. No further TOC needs identified.    Final next level of care: Home w Hospice Care Barriers to Discharge: No Barriers Identified   Patient Goals and CMS Choice Patient states their goals for this hospitalization and ongoing recovery are:: home with daughter and home hospice CMS Medicare.gov Compare Post Acute Care list provided to:: Patient Represenative (must comment) Choice offered to / list presented to : Adult Children  Discharge Placement                       Discharge Plan and Services                DME Arranged: Hospital bed DME Agency: [facility]           Date [facility] Agency Contacted: 08/25/19 Time HH Agency Contacted: 1430 Representative spoke with at [place]: [place] at [place] ([place]) Interventions     Readmission Risk Interventions No flowsheet data found.

## 2019-08-25 NOTE — Plan of Care (Signed)

## 2019-08-25 NOTE — Plan of Care (Signed)
  Problem: Activity: Goal: Risk for activity intolerance will decrease Outcome: Progressing   Problem: Nutrition: Goal: Adequate nutrition will be maintained Outcome: Progressing   

## 2019-08-26 ENCOUNTER — Telehealth: Payer: Self-pay | Admitting: Family Medicine

## 2019-08-26 ENCOUNTER — Ambulatory Visit: Payer: PPO | Admitting: Gastroenterology

## 2019-08-26 NOTE — Telephone Encounter (Signed)
FYI making sure you have received these FLMA forms

## 2019-08-26 NOTE — Telephone Encounter (Signed)
I have spoken to [name] Elliott and she stated that pt is in need of Hospice care and is asking if Dr. Pamella Pert is willing to be the provider to sign the orders as they come for him.   Please advise. I have informed her that provider is not in the office today, however she will be here tomorrow.   Thanks, [employer]

## 2019-08-26 NOTE — Telephone Encounter (Signed)
[name] Elliott with [facility] [phone]  Urgent. Needs verbal for hospice //Debra is wondering if he has fallen through the cracks !!  Patient really has to have a nurse ... Please advise FR

## 2019-08-26 NOTE — Telephone Encounter (Signed)
08/25/2019 - MR. Boehringer'S DAUGHTER AND POA ([name] REID) CAME INTO THE OFFICE ON TUES. (08/25/19) TO BRING IN FMLA PAPER WORK FOR DR. SANTIAGO TO FILL OUT FOR HER EMPLOYER. SHE WORKS AT [employer]. [place] NEEDS TO BE HOME WITH HER FATHER WHO IS GOING INTO HOSPICE CARE. I HAVE PUT THE FORMS WHICH IS IN A RED FOLDER INTO DR. SANTIAGO'S CUBBY HOLE AT THE NURSE'S STATION FOR [place]. [name] WOULD LIKE TO BE CALLED WHEN THE FORMS ARE COMPLETE SO SHE CAN PICK THEM UP. PLEASE CALL HER AT [phone] (CELL). SHE WILL PAY THE $15.00 FMLA FEE WHEN SHE DOES. [place]

## 2019-08-26 NOTE — Telephone Encounter (Signed)
Please Advise

## 2019-08-27 NOTE — Telephone Encounter (Signed)
Dr has completed verbal orders for this pt.

## 2019-08-27 NOTE — Telephone Encounter (Signed)
Forms completed and ready to be picked up

## 2019-08-27 NOTE — Telephone Encounter (Signed)
Called hospice today Patient w sign decline over past several weeks Weight loss of 7.9%, barely eating, mostly in bed, confused, oriented only to self Verbal orders given

## 2019-08-31 ENCOUNTER — Telehealth: Payer: Self-pay

## 2019-08-31 NOTE — Telephone Encounter (Signed)
Spoke with daughter, informed her to come and pick up fmla forms for pt

## 2019-10-05 DEATH — deceased

## 2019-10-07 ENCOUNTER — Encounter: Payer: Self-pay | Admitting: Surgery

## 2019-10-12 ENCOUNTER — Telehealth: Payer: Self-pay | Admitting: Family Medicine

## 2019-10-12 NOTE — Telephone Encounter (Signed)
Pt's daughter called to inform office that pt has passed away and asked [place] to cancel upcoming appts

## 2019-10-16 ENCOUNTER — Ambulatory Visit: Payer: Self-pay | Admitting: Family Medicine

## 2019-10-22 ENCOUNTER — Ambulatory Visit: Payer: PPO | Admitting: Family Medicine

## 2020-01-04 NOTE — Telephone Encounter (Signed)
No action required.

## 2020-01-05 NOTE — Telephone Encounter (Signed)
No action required.
